# Patient Record
Sex: Female | Born: 1940 | Race: Black or African American | Hispanic: No | Marital: Married | Smoking: Former smoker
Health system: Southern US, Community
[De-identification: ages and names within clinical notes are randomized; demographics above are authoritative.]

## PROBLEM LIST (undated history)

## (undated) DIAGNOSIS — Z9221 Personal history of antineoplastic chemotherapy: Secondary | ICD-10-CM

## (undated) DIAGNOSIS — K449 Diaphragmatic hernia without obstruction or gangrene: Secondary | ICD-10-CM

## (undated) DIAGNOSIS — D702 Other drug-induced agranulocytosis: Principal | ICD-10-CM

## (undated) DIAGNOSIS — IMO0002 Reserved for concepts with insufficient information to code with codable children: Secondary | ICD-10-CM

## (undated) DIAGNOSIS — J45909 Unspecified asthma, uncomplicated: Secondary | ICD-10-CM

## (undated) DIAGNOSIS — E785 Hyperlipidemia, unspecified: Secondary | ICD-10-CM

## (undated) DIAGNOSIS — IMO0001 Reserved for inherently not codable concepts without codable children: Secondary | ICD-10-CM

## (undated) DIAGNOSIS — R1310 Dysphagia, unspecified: Secondary | ICD-10-CM

## (undated) DIAGNOSIS — M069 Rheumatoid arthritis, unspecified: Secondary | ICD-10-CM

## (undated) DIAGNOSIS — J449 Chronic obstructive pulmonary disease, unspecified: Secondary | ICD-10-CM

## (undated) DIAGNOSIS — C3490 Malignant neoplasm of unspecified part of unspecified bronchus or lung: Secondary | ICD-10-CM

## (undated) DIAGNOSIS — Z66 Do not resuscitate: Secondary | ICD-10-CM

## (undated) DIAGNOSIS — C3411 Malignant neoplasm of upper lobe, right bronchus or lung: Secondary | ICD-10-CM

## (undated) DIAGNOSIS — Z5112 Encounter for antineoplastic immunotherapy: Secondary | ICD-10-CM

## (undated) DIAGNOSIS — Z923 Personal history of irradiation: Secondary | ICD-10-CM

## (undated) HISTORY — DX: Dysphagia, unspecified: R13.10

## (undated) HISTORY — DX: Malignant neoplasm of upper lobe, right bronchus or lung: C34.11

## (undated) HISTORY — DX: Chronic obstructive pulmonary disease, unspecified: J44.9

## (undated) HISTORY — DX: Other drug-induced agranulocytosis: D70.2

## (undated) HISTORY — PX: OTHER SURGICAL HISTORY: SHX169

## (undated) HISTORY — DX: Encounter for antineoplastic immunotherapy: Z51.12

---

## 1960-10-21 HISTORY — PX: OTHER SURGICAL HISTORY: SHX169

## 2001-05-29 ENCOUNTER — Encounter (INDEPENDENT_AMBULATORY_CARE_PROVIDER_SITE_OTHER): Payer: Self-pay | Admitting: *Deleted

## 2001-05-29 ENCOUNTER — Ambulatory Visit (HOSPITAL_BASED_OUTPATIENT_CLINIC_OR_DEPARTMENT_OTHER): Admission: RE | Admit: 2001-05-29 | Discharge: 2001-05-29 | Payer: Self-pay | Admitting: *Deleted

## 2009-12-28 ENCOUNTER — Encounter (HOSPITAL_COMMUNITY)
Admission: RE | Admit: 2009-12-28 | Discharge: 2010-03-28 | Payer: Self-pay | Source: Home / Self Care | Admitting: Internal Medicine

## 2010-11-13 ENCOUNTER — Ambulatory Visit (HOSPITAL_COMMUNITY)
Admission: RE | Admit: 2010-11-13 | Discharge: 2010-11-13 | Payer: Self-pay | Source: Home / Self Care | Attending: Internal Medicine | Admitting: Internal Medicine

## 2011-03-08 NOTE — Op Note (Signed)
[place]  Patient:    Destiny Harrison, Destiny Harrison                           MRN: [id number] Proc. Date: 05/29/01 Adm. Date:  [id number] Attending:  Kendell Bane                           Operative Report  PREOPERATIVE DIAGNOSIS:  Mass left wrist and left forearm.  POSTOPERATIVE DIAGNOSIS:  Mass left wrist and left forearm.  PROCEDURE:  Excision of mass left wrist and left forearm.  SURGEON:  Lowell Bouton, M.D.  ANESTHESIA:  General.  OPERATIVE FINDINGS:  The patient had a diffuse swelling over the volar radial aspect of the left wrist and just deep to the radial artery was a synovial type of mass that extended into the radioscaphoid joint.  The second mass was located on the subcutaneous border of the ulna just distal to the olecranon. It was a fibrous type of mass without any fluid in it.  It was about 3 cm in diameter.  PROCEDURE:  Under general anesthesia with the tourniquet on the left arm, the left arm was prepped and draped in the usual fashion and after exsanguinating the limb the tourniquet was inflated to 250 mmHg.  A v-shaped incision was made over the volar radial aspect of the wrist, and blunt dissection carried through the subcutaneous tissues.  Bleeding points were coagulated.  Blunt dissection was carried down to the radial artery and a vessel loop was placed around the artery proximally.  The artery was then dissected out distally taking care to preserve its branches.  The mass was identified just deep to the radial artery and was dissected out from proximal to distal along the FCR sheath.  Sharp dissection was done from proximal to distal and the radiocarpal joint was entered.  After completing excising the mass there was some synovial type, fatty type tissue in the joint which was removed with a rongeur.  The joint was examined and there was some mild degenerative changes but no gross arthritic changes.  This  wound was then packed open and a longitudinal incision was made in the proximal forearm over the subcutaneous border of the ulna.  Sharp dissection was carried through the subcutaneous tissues and bleeding points were coagulated.  Blunt dissection was carried down to the mass which was bluntly dissected out circumferentially.  The mass was then sharply excised from the underlying periosteum.  It did not have any cystic fluid in it.  Both lesions were sent to the lab for evaluation including a portion of the wrist mass for crystal evaluation.  Bleeding was controlled with electrocautery over the forearm.  The wound was irrigated copiously with saline and closed over a vessel loop drain using 4-0 Vicryl in the subcutaneous tissue and 3-0 subcuticular Prolene in the skin.  Sterile dressings were applied.  The tourniquet was then released prior to closure of the wrist wound.  There was no gross bleeding from the radial artery.  The wound was irrigated copiously with saline.  The subcutaneous tissue was closed with 4-0 Vicryl and the skin with a 3-0 subcuticular Prolene.  A vessel loop drain was left in for drainage.  Steri-Strips were applied.  Sterile dressings were applied followed by a volar wrist splint. The patient tolerated the procedure well and went to the recovery room awake and stable and  in good condition. DD:  05/29/01 TD:  05/30/01 Job: [id number]

## 2011-09-30 ENCOUNTER — Other Ambulatory Visit: Payer: Self-pay | Admitting: Internal Medicine

## 2011-09-30 DIAGNOSIS — R918 Other nonspecific abnormal finding of lung field: Secondary | ICD-10-CM

## 2011-10-01 ENCOUNTER — Ambulatory Visit
Admission: RE | Admit: 2011-10-01 | Discharge: 2011-10-01 | Disposition: A | Discharge status: Home / Self Care | Payer: Medicare Other | Source: Ambulatory Visit | Attending: Internal Medicine | Admitting: Internal Medicine

## 2011-10-01 DIAGNOSIS — R918 Other nonspecific abnormal finding of lung field: Secondary | ICD-10-CM

## 2011-10-01 MED ORDER — IOHEXOL 300 MG/ML  SOLN
75.0000 mL | Freq: Once | INTRAMUSCULAR | Status: AC | PRN
  Administered 2011-10-01: 75 mL via INTRAVENOUS

## 2011-10-02 ENCOUNTER — Other Ambulatory Visit: Payer: Self-pay

## 2011-10-14 ENCOUNTER — Telehealth: Payer: Self-pay | Admitting: *Deleted

## 2011-10-14 NOTE — Telephone Encounter (Signed)
LMTCBx1 with pt husband to have her r/s appt on 10-18-11. Please see TD, Victorino Dike, or Lawson Fiscal to get the pt r/s. Thanks.Carron Curie, CMA

## 2011-10-16 NOTE — Telephone Encounter (Signed)
Patient is rescheduled for new consult with MR on 10/30/11 @ 3:15om.

## 2011-10-18 ENCOUNTER — Institutional Professional Consult (permissible substitution): Payer: Medicare Other | Admitting: Internal Medicine

## 2011-10-29 ENCOUNTER — Encounter: Payer: Self-pay | Admitting: Internal Medicine

## 2011-10-30 ENCOUNTER — Ambulatory Visit (INDEPENDENT_AMBULATORY_CARE_PROVIDER_SITE_OTHER): Payer: Medicare Other | Admitting: Internal Medicine

## 2011-10-30 ENCOUNTER — Encounter: Payer: Self-pay | Admitting: Internal Medicine

## 2011-10-30 VITALS — BP 120/68 | HR 99 | Temp 98.7°F | Ht 64.0 in | Wt 116.4 lb

## 2011-10-30 DIAGNOSIS — R0600 Dyspnea, unspecified: Secondary | ICD-10-CM

## 2011-10-30 DIAGNOSIS — R0989 Other specified symptoms and signs involving the circulatory and respiratory systems: Secondary | ICD-10-CM

## 2011-10-30 DIAGNOSIS — R222 Localized swelling, mass and lump, trunk: Secondary | ICD-10-CM

## 2011-10-30 DIAGNOSIS — R0609 Other forms of dyspnea: Secondary | ICD-10-CM

## 2011-10-30 NOTE — Progress Notes (Signed)
Subjective:    Patient ID: Destiny Harrison, female    DOB: 25-Nov-1940, 71 y.o.   MRN: [id number]  HPI IOV 10/30/2011 71 year old  reports that she quit smoking about 2 years ago but had 20 pack smokng hx.  Her smoking use included Cigarettes. She has a 20 pack-year smoking history. She has never used smokeless tobacco. states that in October 2012 noticed weight loss 9# in 3 months with current Body mass index is 19.98 kg/(m^2). With associated anemia. Then had CXR 09/26/11 shows LUL opacity which resulted in CT chest 10/01/11 which shows changes (below) suggestive of lung cancer. States son who is in waiting room know of possibility but she does not want him involved at this point   Known to have RA - from age 9. Dr Dierdre Forth. Prednisone and Methotrexate  Known to have asthma - uses only albuterol. Does not take anything else. Rates asthma as mild-moderate. Childhood asthma that resolved but last few years wheezing, dyspnea. USes albuterol twice a week atleast.  Has exertional dyspnea: half a mile. Improved by rest. Has chronic cough - with periodic flares.   Ct Chest W Contrast  10/01/2011  *RADIOLOGY REPORT*  Clinical Data: Unexplained weight loss, dry cough, also unexplained anemia, former smoker  CT CHEST WITH CONTRAST  Technique:  Multidetector CT imaging of the chest was performed following the standard protocol during bolus administration of intravenous contrast.  Contrast: 75mL OMNIPAQUE IOHEXOL 300 MG/ML IV SOLN  Comparison: Chest x-ray of 09/26/2011 and chest x-ray of 10/06/2007    Findings: On the lung window images there are diffuse changes of centrilobular emphysema present.  However, within the left upper lobe medially there is a spiculated mass present of approximately 3.3 x 2.8 x 6.1 cm consistent with primary lung carcinoma. Prominent interstitial markings and nodularity within the left upper lobe suspicious for metastatic involvement and lymphangitic spread of carcinoma.  The left upper  lobe bronchus is completely occluded presumably by tumor invasion. A small nodular opacity is present just above hemidiaphragm at the left lung base.  This most likely represents a pleural plaque, but a metastatic lesion cannot be excluded.  On soft tissue window images, the thyroid gland is unremarkable. The left upper lobe mass abuts the mediastinal contours with some loss of fat planes suggesting possible mediastinal invasion.  The superior mediastinal lymph node has short-axis diameter of 9 mm. No other mediastinal or hilar adenopathy is seen.  The pulmonary arteries and thoracic aorta opacify with no acute abnormality noted.    IMPRESSION:  1.  Spiculated mass within the medial left upper lobe abutting the mediastinum consistent with primary lung carcinoma. 2.  Prominent interstitial markings and nodularity within the left upper lobe may represent metastatic involvement and or lymphangitic spread of carcinoma. 3.  Slightly prominent superior mediastinal lymph node.  No other evidence of adenopathy. 4.  Diffuse changes of centrilobular emphysema.  Original Report Authenticated By: Juline Patch, M.D.    Past Medical History  Diagnosis Date  . COPD (chronic obstructive pulmonary disease)      Family History  Problem Relation Age of Onset  . Diabetes Mother     insulin dependent  . Breast cancer Mother      History   Social History  . Marital Status: Married    Spouse Name: N/A    Number of Children: 4  . Years of Education: N/A   Occupational History  . retired      Technical brewer  .  Social History Main Topics  . Smoking status: Former Smoker -- 0.5 packs/day for 40 years    Types: Cigarettes    Quit date: 10/29/2009  . Smokeless tobacco: Never Used  . Alcohol Use: No  . Drug Use: No  . Sexually Active: Not on file   Other Topics Concern  . Not on file   Social History Narrative   Spiritual person. Member of [employer] at [employer] where she lives  since age 86. Never missed a sermon. Sings in choir. CHildren go to same church but husband does not. Stated 10/30/11 she is not afraid of lung cancer diagnosis or prognosis because it is in "god's hand" and "god's will".      No Known Allergies   Outpatient Prescriptions Prior to Visit  Medication Sig Dispense Refill  . albuterol (PROVENTIL HFA;VENTOLIN HFA) 108 (90 BASE) MCG/ACT inhaler Inhale 2 puffs into the lungs every 6 (six) hours as needed.        . calcium carbonate 200 MG capsule Take 250 mg by mouth daily.        . Cholecalciferol (VITAMIN D PO) Take by mouth. 5000 units qd        . folic acid (FOLVITE) 1 MG tablet Take 1 mg by mouth daily.        Marland Kitchen HYDROcodone-acetaminophen (VICODIN) 5-500 MG per tablet Take 1 tablet by mouth every 6 (six) hours as needed.        . methotrexate 25 MG/ML SOLN 25 mg once. 1ml once weekly       . predniSONE (DELTASONE) 5 MG tablet Take 5 mg by mouth daily.        . Simvastatin (ZOCOR PO) Take 5 mg by mouth once.        Marland Kitchen levofloxacin (LEVAQUIN) 500 MG tablet Take 500 mg by mouth daily.               Review of Systems  Constitutional: Positive for unexpected weight change. Negative for fever.  HENT: Positive for rhinorrhea. Negative for ear pain, nosebleeds, congestion, sore throat, sneezing, trouble swallowing, dental problem, postnasal drip and sinus pressure.   Eyes: Negative for redness and itching.  Respiratory: Positive for cough, chest tightness, shortness of breath and wheezing.   Cardiovascular: Positive for leg swelling. Negative for palpitations.  Gastrointestinal: Positive for vomiting. Negative for nausea.  Genitourinary: Negative for dysuria.  Musculoskeletal: Positive for joint swelling.  Skin: Negative for rash.  Neurological: Negative for headaches.  Hematological: Does not bruise/bleed easily.  Psychiatric/Behavioral: Negative for dysphoric mood. The patient is not nervous/anxious.        Objective:   Physical Exam    Vitals reviewed. Constitutional: She is oriented to person, place, and time. She appears well-developed and well-nourished. No distress.       Body mass index is 19.98 kg/(m^2). Cachexia  HENT:  Head: Normocephalic and atraumatic.  Right Ear: External ear normal.  Left Ear: External ear normal.  Mouth/Throat: Oropharynx is clear and moist. No oropharyngeal exudate.  Eyes: Conjunctivae and EOM are normal. Pupils are equal, round, and reactive to light. Right eye exhibits no discharge. Left eye exhibits no discharge. No scleral icterus.  Neck: Normal range of motion. Neck supple. No JVD present. No tracheal deviation present. No thyromegaly present.  Cardiovascular: Normal rate, regular rhythm, normal heart sounds and intact distal pulses.  Exam reveals no gallop and no friction rub.   No murmur heard. Pulmonary/Chest: Effort normal and breath sounds normal. No  respiratory distress. She has no wheezes. She has no rales. She exhibits no tenderness.  Abdominal: Soft. Bowel sounds are normal. She exhibits no distension and no mass. There is no tenderness. There is no rebound and no guarding.  Musculoskeletal: Normal range of motion. She exhibits no edema and no tenderness.       RA+  Lymphadenopathy:    She has no cervical adenopathy.  Neurological: She is alert and oriented to person, place, and time. She has normal reflexes. No cranial nerve deficit. She exhibits normal muscle tone. Coordination normal.  Skin: Skin is warm and dry. No rash noted. She is not diaphoretic. No erythema. No pallor.  Psychiatric: She has a normal mood and affect. Her behavior is normal. Judgment and thought content normal.       Spiritual +          Assessment & Plan:

## 2011-10-30 NOTE — Patient Instructions (Addendum)
#  chest mass   - please have PET scan in < 1 week   - that will help [place] decide next biopsy procedure #cough and shortness of breath  - pleaes have full PFT breathing test at [facility] or [facility] < 1 week #Followup   < 1 week afte PET scan - if delays let me or my nurses know If unable to see < 1 week, then I will call to discuss results and next step

## 2011-11-01 ENCOUNTER — Ambulatory Visit (INDEPENDENT_AMBULATORY_CARE_PROVIDER_SITE_OTHER): Payer: Medicare Other | Admitting: Internal Medicine

## 2011-11-01 DIAGNOSIS — R0602 Shortness of breath: Secondary | ICD-10-CM

## 2011-11-01 LAB — PULMONARY FUNCTION TEST

## 2011-11-01 NOTE — Progress Notes (Signed)
PFT done today. 

## 2011-11-04 DIAGNOSIS — R0600 Dyspnea, unspecified: Secondary | ICD-10-CM | POA: Insufficient documentation

## 2011-11-04 DIAGNOSIS — R222 Localized swelling, mass and lump, trunk: Secondary | ICD-10-CM | POA: Insufficient documentation

## 2011-11-04 NOTE — Assessment & Plan Note (Signed)
#  cough and shortness of breath  - pleaes have full PFT breathing test at [facility] or [facility] < 1 week

## 2011-11-04 NOTE — Assessment & Plan Note (Signed)
#  chest mass - I discussed with her ddx and expressed and shared her concern that is high pre-test risk for malignancy  PLAN   - please have PET scan in < 1 week   - that will help [place] decide next biopsy procedure  #Followup   < 1 week afte PET scan - if delays let me or my nurses know If unable to see < 1 week, then I will call to discuss results and next step

## 2011-11-07 ENCOUNTER — Encounter (HOSPITAL_COMMUNITY): Payer: Self-pay

## 2011-11-07 ENCOUNTER — Encounter (HOSPITAL_COMMUNITY)
Admission: RE | Admit: 2011-11-07 | Discharge: 2011-11-07 | Disposition: A | Discharge status: Home / Self Care | Payer: Medicare Other | Source: Ambulatory Visit | Attending: Internal Medicine | Admitting: Internal Medicine

## 2011-11-07 ENCOUNTER — Telehealth: Payer: Self-pay | Admitting: Internal Medicine

## 2011-11-07 DIAGNOSIS — R599 Enlarged lymph nodes, unspecified: Secondary | ICD-10-CM | POA: Insufficient documentation

## 2011-11-07 DIAGNOSIS — J984 Other disorders of lung: Secondary | ICD-10-CM | POA: Insufficient documentation

## 2011-11-07 DIAGNOSIS — R222 Localized swelling, mass and lump, trunk: Secondary | ICD-10-CM

## 2011-11-07 LAB — GLUCOSE, CAPILLARY: Glucose-Capillary: 95 mg/dL (ref 70–99)

## 2011-11-07 MED ORDER — FLUDEOXYGLUCOSE F - 18 (FDG) INJECTION: 18.9000 | Freq: Once | INTRAVENOUS | Status: AC | PRN

## 2011-11-07 NOTE — Telephone Encounter (Signed)
Called patient to give PET scan report: PEt scan improved but one area of hot uptake prsent. Might need EBUS v regular bronch v watch. Advised her to come in to discuss. Ensure full PFTs prior to visit. Needs to come in < 2 weeks; if not let me know

## 2011-11-08 ENCOUNTER — Encounter: Payer: Self-pay | Admitting: Internal Medicine

## 2011-11-12 NOTE — Telephone Encounter (Signed)
LMTCBx1 to schedule appt. I have held slot on 11-18-11 for her. If she can do this appt then we will need to order pft to be done at hospital prior to appt. Will await call-back. Carron Curie, CMA

## 2011-11-13 NOTE — Telephone Encounter (Signed)
LMTCBx1.Jennifer Castillo, CMA  

## 2011-11-13 NOTE — Telephone Encounter (Addendum)
Pt is returning MR's call & can be reached at [phone].   Attempted to schedule PFT & appt., however, pt stated she has all ready done the breathing test.   Antionette Fairy

## 2011-11-13 NOTE — Telephone Encounter (Signed)
Pt advised that she does not need another PFT. Appt set for 11-18-11 at 2pm. Carron Curie, CMA

## 2011-11-13 NOTE — Telephone Encounter (Addendum)
Pt returned call. Pt had pft's recently (11/01/11)- does she need this again? I did not put pt on MR's schedule for f/u yet as she requests to speak to nurse first.   Hazel Sams

## 2011-11-13 NOTE — Telephone Encounter (Signed)
Pt called back-confused.  I explained that MR has told us that he would like her to complete a breathing test prior to her next appointment.  Pt again stated that she has all ready completed one.  I explained that we have made that notation & that we are waiting to hear back from MR as to whether or not she needs another PFT since her 11/01/11 PFT.  Pt requested [place] to give her a call back asap so we can get whatever appts needed scheduled.  Thanks.  Antionette Fairy

## 2011-11-18 ENCOUNTER — Ambulatory Visit (INDEPENDENT_AMBULATORY_CARE_PROVIDER_SITE_OTHER): Payer: Medicare Other | Admitting: Internal Medicine

## 2011-11-18 ENCOUNTER — Encounter: Payer: Self-pay | Admitting: Internal Medicine

## 2011-11-18 VITALS — BP 120/62 | HR 86 | Temp 97.8°F | Ht 64.0 in | Wt 119.0 lb

## 2011-11-18 DIAGNOSIS — R599 Enlarged lymph nodes, unspecified: Secondary | ICD-10-CM

## 2011-11-18 DIAGNOSIS — Z23 Encounter for immunization: Secondary | ICD-10-CM

## 2011-11-18 DIAGNOSIS — C3490 Malignant neoplasm of unspecified part of unspecified bronchus or lung: Secondary | ICD-10-CM

## 2011-11-18 DIAGNOSIS — J449 Chronic obstructive pulmonary disease, unspecified: Secondary | ICD-10-CM | POA: Diagnosis not present

## 2011-11-18 DIAGNOSIS — R222 Localized swelling, mass and lump, trunk: Secondary | ICD-10-CM

## 2011-11-18 MED ORDER — TIOTROPIUM BROMIDE MONOHYDRATE 18 MCG IN CAPS: 18.0000 ug | ORAL_CAPSULE | Freq: Every day | RESPIRATORY_TRACT | Status: DC

## 2011-11-18 NOTE — Assessment & Plan Note (Signed)
Improved on PET scan within 1 month. Only hot lesion is a left hilar node. Best to repeat ct chest with contrast in 2-3 months and reassess. She is agreeable with plan

## 2011-11-18 NOTE — Patient Instructions (Signed)
#  COPD  - you have copd that is moderate  - nurse will chagne pro-air to generic ventolin - Please start spiriva 1 puff daily - take sample, script and show technique - glad you had flu shot  - take pneumovax vaccine today #Lung Mass  - this is much improved but not fully gone  - please have CT chest with contrast in 2 months for mediastinal node #Followup  - 2 months iwht cT chest  - return sooner if there are problems

## 2011-11-18 NOTE — Assessment & Plan Note (Signed)
PFts done while on chronic prednisone for RA shows gold stage 2 copd. Will start spiriva. She has had flu shot. Will give pneumovax. Next visit check alpha 1. AT somepoint, will talk rehab

## 2011-11-18 NOTE — Progress Notes (Signed)
Subjective:    Patient ID: Destiny Harrison, female    DOB: May 27, 1941, 71 y.o.   MRN: [id number]  HPI  IOV 11/18/2011 71 year old  reports that she quit smoking about 2 years ago but had 20 pack smokng hx.  Her smoking use included Cigarettes. She has a 20 pack-year smoking history. She has never used smokeless tobacco. states that in October 2012 noticed weight loss 9# in 3 months with current There is no height or weight on file to calculate BMI. With associated anemia. Then had CXR 09/26/11 shows LUL opacity which resulted in CT chest 10/01/11 which shows changes (below) suggestive of lung cancer. States son who is in waiting room know of possibility but she does not want him involved at this point   Known to have RA - from age 58. Dr Dierdre Forth. Prednisone and Methotrexate  Known to have asthma - uses only albuterol. Does not take anything else. Rates asthma as mild-moderate. Childhood asthma that resolved but last few years wheezing, dyspnea. USes albuterol twice a week atleast.  Has exertional dyspnea: half a mile. Improved by rest. Has chronic cough - with periodic flares.   Ct Chest W Contrast  10/01/2011  *RADIOLOGY REPORT*  Clinical Data: Unexplained weight loss, dry cough, also unexplained anemia, former smoker  CT CHEST WITH CONTRAST  Technique:  Multidetector CT imaging of the chest was performed following the standard protocol during bolus administration of intravenous contrast.  Contrast: 75mL OMNIPAQUE IOHEXOL 300 MG/ML IV SOLN  Comparison: Chest x-ray of 09/26/2011 and chest x-ray of 10/06/2007    Findings: On the lung window images there are diffuse changes of centrilobular emphysema present.  However, within the left upper lobe medially there is a spiculated mass present of approximately 3.3 x 2.8 x 6.1 cm consistent with primary lung carcinoma. Prominent interstitial markings and nodularity within the left upper lobe suspicious for metastatic involvement and lymphangitic spread of  carcinoma.  The left upper lobe bronchus is completely occluded presumably by tumor invasion. A small nodular opacity is present just above hemidiaphragm at the left lung base.  This most likely represents a pleural plaque, but a metastatic lesion cannot be excluded.  On soft tissue window images, the thyroid gland is unremarkable. The left upper lobe mass abuts the mediastinal contours with some loss of fat planes suggesting possible mediastinal invasion.  The superior mediastinal lymph node has short-axis diameter of 9 mm. No other mediastinal or hilar adenopathy is seen.  The pulmonary arteries and thoracic aorta opacify with no acute abnormality noted.    IMPRESSION:  1.  Spiculated mass within the medial left upper lobe abutting the mediastinum consistent with primary lung carcinoma. 2.  Prominent interstitial markings and nodularity within the left upper lobe may represent metastatic involvement and or lymphangitic spread of carcinoma. 3.  Slightly prominent superior mediastinal lymph node.  No other evidence of adenopathy. 4.  Diffuse changes of centrilobular emphysema.  Original Report Authenticated By: Juline Patch, M.D.   #chest mass  - please have PET scan in < 1 week  - that will help [place] decide next biopsy procedure  #cough and shortness of breath  - please have full PFT breathing test at [facility] < 1 [facility]  #Followup  < 1 week afte PET scan - if delays let me or my nurses know  If unable to see < 1 week, then I will call to discuss results and next step    OV 11/18/2011  No  new complaints. Feels well; just with baseline issues of last vsiti.   Thinks she is losing weight but indeed she is gaining weight.   PET scan Nm Pet Image Initial (pi) Skull Base To Thigh: shows significant improvement of LUL mass. Only residual is a small left hilar node that is still PET HOT (this is done 1 month from date of CT chest)  11/07/2011  *RADIOLOGY REPORT*  Clinical Data:  Initial treatment  strategy for chest mass.  NUCLEAR MEDICINE PET CT INITIAL (PI) SKULL BASE TO THIGH  Technique:  18.9 mCi F-18 FDG was injected intravenously via the right antecubital fossa.  Full-ring PET imaging was performed from the skull base through the mid-thighs 61  minutes after injection. CT data was obtained and used for attenuation correction and anatomic localization only.  (This was not acquired as a diagnostic CT examination.)  Fasting Blood Glucose:  95  Patient Weight:  116 pounds.  Comparison:  CT chest 10/01/2011.  Findings: There has been marked interval improvement in left upper lobe air space consolidation.  Residual nodularity and volume loss in the left upper lobe shows mild hypermetabolism.  Focal hypermetabolism in the left hilum has an S U V max of 10.7.  There is corresponding soft tissue density within the left upper lobe bronchus (CT image 79). This finding is also seen on 10/01/2011. No additional areas of abnormal hypermetabolism in the neck, chest, abdomen or pelvis.  CT images were performed for attenuation correction.  No acute findings in the neck.  No pericardial or pleural effusion.  No acute findings in the abdomen or pelvis.  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  IMPRESSION:  1.  Interval improvement in left upper lobe air space consolidation with residual peribronchovascular nodularity and volume loss as well as mild corresponding hypermetabolism. 2.  Intense hypermetabolism in the left hilum corresponds to soft tissue or debris in the left upper lobe bronchus, also seen on 10/01/2011.  Continued follow-up to clearing or bronchoscopy recommended, as a centrally obstructing lesion is considered.  Original Report Authenticated By: Reyes Ivan, M.D.    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 FEv1 1L/54%., 14% BD response on FVC, ratio 54, DLCO 42%   Past, Family, Social reviewed: no change since last visit   Review of Systems  Constitutional:  Negative for fever and unexpected weight change.  HENT: Negative for ear pain, nosebleeds, congestion, sore throat, rhinorrhea, sneezing, trouble swallowing, dental problem, postnasal drip and sinus pressure.   Eyes: Negative for redness and itching.  Respiratory: Negative for cough, chest tightness, shortness of breath and wheezing.   Cardiovascular: Negative for palpitations and leg swelling.  Gastrointestinal: Positive for vomiting. Negative for nausea.  Genitourinary: Negative for dysuria.  Musculoskeletal: Negative for joint swelling.  Skin: Negative for rash.  Neurological: Negative for headaches.  Hematological: Does not bruise/bleed easily.  Psychiatric/Behavioral: Negative for dysphoric mood. The patient is not nervous/anxious.        Objective:   Physical Exam  Vitals reviewed. Constitutional: She is oriented to person, place, and time. She appears well-developed and well-nourished. No distress.       Body mass index is 19.98 kg/(m^2). - Last ov Cachexia Body mass index is 20.43 kg/(m^2). 11/18/2011    HENT:  Head: Normocephalic and atraumatic.  Right Ear: External ear normal.  Left Ear: External ear normal.  Mouth/Throat: Oropharynx is clear and moist. No oropharyngeal exudate.  Eyes: Conjunctivae and EOM are  normal. Pupils are equal, round, and reactive to light. Right eye exhibits no discharge. Left eye exhibits no discharge. No scleral icterus.  Neck: Normal range of motion. Neck supple. No JVD present. No tracheal deviation present. No thyromegaly present.  Cardiovascular: Normal rate, regular rhythm, normal heart sounds and intact distal pulses.  Exam reveals no gallop and no friction rub.   No murmur heard. Pulmonary/Chest: Effort normal and breath sounds normal. No respiratory distress. She has no wheezes. She has no rales. She exhibits no tenderness.  Abdominal: Soft. Bowel sounds are normal. She exhibits no distension and no mass. There is no tenderness. There is  no rebound and no guarding.  Musculoskeletal: Normal range of motion. She exhibits no edema and no tenderness.       RA+  Lymphadenopathy:    She has no cervical adenopathy.  Neurological: She is alert and oriented to person, place, and time. She has normal reflexes. No cranial nerve deficit. She exhibits normal muscle tone. Coordination normal.  Skin: Skin is warm and dry. No rash noted. She is not diaphoretic. No erythema. No pallor.  Psychiatric: She has a normal mood and affect. Her behavior is normal. Judgment and thought content normal.       Spiritual +          Assessment & Plan:

## 2011-11-21 DIAGNOSIS — M069 Rheumatoid arthritis, unspecified: Secondary | ICD-10-CM | POA: Diagnosis not present

## 2011-11-21 DIAGNOSIS — M47817 Spondylosis without myelopathy or radiculopathy, lumbosacral region: Secondary | ICD-10-CM | POA: Diagnosis not present

## 2011-11-21 DIAGNOSIS — M899 Disorder of bone, unspecified: Secondary | ICD-10-CM | POA: Diagnosis not present

## 2011-11-21 DIAGNOSIS — M159 Polyosteoarthritis, unspecified: Secondary | ICD-10-CM | POA: Diagnosis not present

## 2011-11-21 DIAGNOSIS — M79609 Pain in unspecified limb: Secondary | ICD-10-CM | POA: Diagnosis not present

## 2011-11-21 DIAGNOSIS — IMO0002 Reserved for concepts with insufficient information to code with codable children: Secondary | ICD-10-CM | POA: Diagnosis not present

## 2011-11-21 DIAGNOSIS — M889 Osteitis deformans of unspecified bone: Secondary | ICD-10-CM | POA: Diagnosis not present

## 2011-12-17 DIAGNOSIS — M159 Polyosteoarthritis, unspecified: Secondary | ICD-10-CM | POA: Diagnosis not present

## 2011-12-17 DIAGNOSIS — M069 Rheumatoid arthritis, unspecified: Secondary | ICD-10-CM | POA: Diagnosis not present

## 2011-12-17 DIAGNOSIS — M889 Osteitis deformans of unspecified bone: Secondary | ICD-10-CM | POA: Diagnosis not present

## 2011-12-17 DIAGNOSIS — M899 Disorder of bone, unspecified: Secondary | ICD-10-CM | POA: Diagnosis not present

## 2012-01-14 ENCOUNTER — Ambulatory Visit (INDEPENDENT_AMBULATORY_CARE_PROVIDER_SITE_OTHER)
Admission: RE | Admit: 2012-01-14 | Discharge: 2012-01-14 | Disposition: A | Discharge status: Home / Self Care | Payer: Medicare Other | Source: Ambulatory Visit | Attending: Internal Medicine | Admitting: Internal Medicine

## 2012-01-14 ENCOUNTER — Telehealth: Payer: Self-pay | Admitting: Internal Medicine

## 2012-01-14 ENCOUNTER — Encounter: Payer: Self-pay | Admitting: Internal Medicine

## 2012-01-14 ENCOUNTER — Ambulatory Visit (INDEPENDENT_AMBULATORY_CARE_PROVIDER_SITE_OTHER): Payer: Medicare Other | Admitting: Internal Medicine

## 2012-01-14 VITALS — BP 132/70 | HR 115 | Temp 98.5°F | Ht 64.5 in | Wt 135.2 lb

## 2012-01-14 DIAGNOSIS — J438 Other emphysema: Secondary | ICD-10-CM | POA: Diagnosis not present

## 2012-01-14 DIAGNOSIS — R222 Localized swelling, mass and lump, trunk: Secondary | ICD-10-CM

## 2012-01-14 DIAGNOSIS — R599 Enlarged lymph nodes, unspecified: Secondary | ICD-10-CM | POA: Diagnosis not present

## 2012-01-14 DIAGNOSIS — K449 Diaphragmatic hernia without obstruction or gangrene: Secondary | ICD-10-CM | POA: Diagnosis not present

## 2012-01-14 DIAGNOSIS — J189 Pneumonia, unspecified organism: Secondary | ICD-10-CM | POA: Diagnosis not present

## 2012-01-14 MED ORDER — IOHEXOL 300 MG/ML  SOLN
80.0000 mL | Freq: Once | INTRAMUSCULAR | Status: AC | PRN
  Administered 2012-01-14: 80 mL via INTRAVENOUS

## 2012-01-14 NOTE — Progress Notes (Signed)
Subjective:    Patient ID: Destiny Harrison, female    DOB: 1941-10-17, 71 y.o.   MRN: [id number]  HPI IOV 11/18/2011 71 year old  reports that she quit smoking about 2 years ago but had 20 pack smokng hx.  Her smoking use included Cigarettes. She has a 20 pack-year smoking history. She has never used smokeless tobacco. states that in October 2012 noticed weight loss 9# in 3 months with current There is no height or weight on file to calculate BMI. With associated anemia. Then had CXR 09/26/11 shows LUL opacity which resulted in CT chest 10/01/11 which shows changes (below) suggestive of lung cancer. States son who is in waiting room know of possibility but she does not want him involved at this point   Known to have RA - from age 37. Dr Dierdre Forth. Prednisone and Methotrexate  Known to have asthma - uses only albuterol. Does not take anything else. Rates asthma as mild-moderate. Childhood asthma that resolved but last few years wheezing, dyspnea. USes albuterol twice a week atleast.  Has exertional dyspnea: half a mile. Improved by rest. Has chronic cough - with periodic flares.   Ct Chest W Contrast  10/01/2011  *RADIOLOGY REPORT*  Clinical Data: Unexplained weight loss, dry cough, also unexplained anemia, former smoker  CT CHEST WITH CONTRAST  Technique:  Multidetector CT imaging of the chest was performed following the standard protocol during bolus administration of intravenous contrast.  Contrast: 75mL OMNIPAQUE IOHEXOL 300 MG/ML IV SOLN  Comparison: Chest x-ray of 09/26/2011 and chest x-ray of 10/06/2007    Findings: On the lung window images there are diffuse changes of centrilobular emphysema present.  However, within the left upper lobe medially there is a spiculated mass present of approximately 3.3 x 2.8 x 6.1 cm consistent with primary lung carcinoma. Prominent interstitial markings and nodularity within the left upper lobe suspicious for metastatic involvement and lymphangitic spread of  carcinoma.  The left upper lobe bronchus is completely occluded presumably by tumor invasion. A small nodular opacity is present just above hemidiaphragm at the left lung base.  This most likely represents a pleural plaque, but a metastatic lesion cannot be excluded.  On soft tissue window images, the thyroid gland is unremarkable. The left upper lobe mass abuts the mediastinal contours with some loss of fat planes suggesting possible mediastinal invasion.  The superior mediastinal lymph node has short-axis diameter of 9 mm. No other mediastinal or hilar adenopathy is seen.  The pulmonary arteries and thoracic aorta opacify with no acute abnormality noted.    IMPRESSION:  1.  Spiculated mass within the medial left upper lobe abutting the mediastinum consistent with primary lung carcinoma. 2.  Prominent interstitial markings and nodularity within the left upper lobe may represent metastatic involvement and or lymphangitic spread of carcinoma. 3.  Slightly prominent superior mediastinal lymph node.  No other evidence of adenopathy. 4.  Diffuse changes of centrilobular emphysema.  Original Report Authenticated By: Juline Patch, M.D.   #chest mass  - please have PET scan in < 1 week  - that will help [place] decide next biopsy procedure  #cough and shortness of breath  - please have full PFT breathing test at [facility] or [facility] < 1 week  #Followup  < 1 week afte PET scan - if delays let me or my nurses know  If unable to see < 1 week, then I will call to discuss results and next step    OV 11/18/2011  No new  complaints. Feels well; just with baseline issues of last vsiti.   Thinks she is losing weight but indeed she is gaining weight.   PET scan Nm Pet Image Initial (pi) Skull Base To Thigh: shows significant improvement of LUL mass. Only residual is a small left hilar node that is still PET HOT (this is done 1 month from date of CT chest)  11/07/2011  *RADIOLOGY REPORT*  Clinical Data:  Initial treatment  strategy for chest mass.  NUCLEAR MEDICINE PET CT INITIAL (PI) SKULL BASE TO THIGH  Technique:  18.9 mCi F-18 FDG was injected intravenously via the right antecubital fossa.  Full-ring PET imaging was performed from the skull base through the mid-thighs 61  minutes after injection. CT data was obtained and used for attenuation correction and anatomic localization only.  (This was not acquired as a diagnostic CT examination.)  Fasting Blood Glucose:  95  Patient Weight:  116 pounds.  Comparison:  CT chest 10/01/2011.  Findings: There has been marked interval improvement in left upper lobe air space consolidation.  Residual nodularity and volume loss in the left upper lobe shows mild hypermetabolism.  Focal hypermetabolism in the left hilum has an S U V max of 10.7.  There is corresponding soft tissue density within the left upper lobe bronchus (CT image 79). This finding is also seen on 10/01/2011. No additional areas of abnormal hypermetabolism in the neck, chest, abdomen or pelvis.  CT images were performed for attenuation correction.  No acute findings in the neck.  No pericardial or pleural effusion.  No acute findings in the abdomen or pelvis.  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  IMPRESSION:  1.  Interval improvement in left upper lobe air space consolidation with residual peribronchovascular nodularity and volume loss as well as mild corresponding hypermetabolism. 2.  Intense hypermetabolism in the left hilum corresponds to soft tissue or debris in the left upper lobe bronchus, also seen on 10/01/2011.  Continued follow-up to clearing or bronchoscopy recommended, as a centrally obstructing lesion is considered.  Original Report Authenticated By: Reyes Ivan, M.D.    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 FEv1 1L/54%., 14% BD response on FVC, ratio 54, DLCO 42%   Past, Family, Social reviewed: no change since last   #COPD  - you have copd that is moderate    - nurse will chagne pro-air to generic ventolin  - Please start spiriva 1 puff daily - take sample, script and show technique  - glad you had flu shot  - take pneumovax vaccine today  #Lung Mass  - this is much improved but not fully gone  - please have CT chest with contrast in 2 months for mediastinal node  #Followup  - 2 months iwht cT chest  - return sooner if there are problems  OV 01/14/2012 Body mass index is 22.85 kg/(m^2).  Gained weight Stable class 2 dyspnea  No new health problems Here to discuss results of CT - the LLUL infiltrate shows continued improvement but there is suspicion of endobronchial lesion. Bronch needed  Ct Chest W Contrast  01/14/2012  *RADIOLOGY REPORT*  Clinical Data: Follow up for left upper lobe endobronchial lesion noted on prior CT scans.  CT CHEST WITH CONTRAST  Technique:  Multidetector CT imaging of the chest was performed following the standard protocol during bolus administration of intravenous contrast.  Contrast:  80 ml of Omnipaque-300.  Comparison: PET CT dated 11/07/2011.  Findings:  Mediastinum: Heart size is normal. There is no significant pericardial fluid, thickening or pericardial calcification. Prominent nodal tissue is again noted in the left hilar region measuring up to 8 mm in short axis (similar to prior).  No pathologically enlarged right hilar or mediastinal lymph nodes are noted. There is atherosclerosis of the thoracic aorta, the great vessels of the mediastinum and the coronary arteries, including calcified atherosclerotic plaque in the left main, left circumflex and right coronary arteries. There is a small hiatal hernia.  Lungs/Pleura:  Again noted is soft tissue filling the left upper lobe bronchi extending to the apicoposterior segment.  This soft tissue is similar to the original examination from 10/01/2011, and corresponds with an area of hypermetabolic activity noted on recent PET CT dated 11/07/2011.  There continues to be some  mild postobstructive changes in the apicoposterior segment of the left upper lobe, compatible with postobstructive pneumonitis.  There is a background of moderate centrilobular emphysema and mild diffuse bronchial wall thickening.  No pleural effusions.  Upper Abdomen: Unremarkable.  Musculoskeletal: There are no aggressive appearing lytic or blastic lesions noted in the visualized portions of the skeleton.  IMPRESSION: 1.  Persistent soft tissue filling the bronchi to the apicoposterior segment of the left upper lobe, with persistent postobstructive pneumonitis in the same distribution, as detailed above.  Given the hypermetabolic activity on the recent PET CT, this finding is highly concerning for an endobronchial obstructing neoplasm, and further evaluation with bronchoscopy and bronchoscopic guided biopsy is recommended at this time. 2.  Borderline enlarged left hilar lymph node measuring 8 mm in short axis may be reactive, or could suggest the metastatic nodal involvement as well. 3.  No new mediastinal or contralateral hilar nodal enlargement is identified.  4. 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 Please note that although the presence of coronary artery calcium documents the presence of coronary artery disease, the severity of this disease and any potential stenosis cannot be assessed on this non-gated CT examination.  Assessment for potential risk factor modification, dietary therapy or pharmacologic therapy may be warranted, if clinically indicated. 5.  Small hiatal hernia. 6.  Background of moderate centrilobular emphysema and mild diffuse bronchial wall thickening; changes compatible with underlying COPD.  These results were called by telephone on 01/14/2012  at  2:23 p.m. to  Dr. Marchelle Gearing, who verbally acknowledged these results.  Original Report Authenticated By: Florencia Reasons, M.D.      Review of Systems  Constitutional: Negative for fever and  unexpected weight change.  HENT: Negative for ear pain, nosebleeds, congestion, sore throat, rhinorrhea, sneezing, trouble swallowing, dental problem, postnasal drip and sinus pressure.   Eyes: Negative for redness and itching.  Respiratory: Positive for shortness of breath. Negative for cough, chest tightness and wheezing.   Cardiovascular: Negative for palpitations and leg swelling.  Gastrointestinal: Negative for nausea and vomiting.  Genitourinary: Negative for dysuria.  Musculoskeletal: Negative for joint swelling.  Skin: Negative for rash.  Neurological: Negative for headaches.  Hematological: Does not bruise/bleed easily.  Psychiatric/Behavioral: Negative for dysphoric mood. The patient is not nervous/anxious.        Objective:   Physical Exam Vitals reviewed. Constitutional: She is oriented to person, place, and time. She appears well-developed and well-nourished. No distress.       Body mass index is 19.98 kg/(m^2). - Last ov Cachexia Body mass index is 20.43 kg/(m^2). 11/18/2011    HENT:  Head: Normocephalic and atraumatic.  Right Ear: External ear  normal.  Left Ear: External ear normal.  Mouth/Throat: Oropharynx is clear and moist. No oropharyngeal exudate.  Eyes: Conjunctivae and EOM are normal. Pupils are equal, round, and reactive to light. Right eye exhibits no discharge. Left eye exhibits no discharge. No scleral icterus.  Neck: Normal range of motion. Neck supple. No JVD present. No tracheal deviation present. No thyromegaly present.  Cardiovascular: Normal rate, regular rhythm, normal heart sounds and intact distal pulses.  Exam reveals no gallop and no friction rub.   No murmur heard. Pulmonary/Chest: Effort normal and breath sounds normal. No respiratory distress. She has no wheezes. She has no rales. She exhibits no tenderness.  Abdominal: Soft. Bowel sounds are normal. She exhibits no distension and no mass. There is no tenderness. There is no rebound and no  guarding.  Musculoskeletal: Normal range of motion. She exhibits no edema and no tenderness.       RA+  Lymphadenopathy:    She has no cervical adenopathy.  Neurological: She is alert and oriented to person, place, and time. She has normal reflexes. No cranial nerve deficit. She exhibits normal muscle tone. Coordination normal.  Skin: Skin is warm and dry. No rash noted. She is not diaphoretic. No erythema. No pallor.  Psychiatric: She has a normal mood and affect. Her behavior is normal. Judgment and thought content normal.       Spiritual +         Assessment & Plan:

## 2012-01-14 NOTE — Telephone Encounter (Signed)
done

## 2012-01-14 NOTE — Patient Instructions (Signed)
#  COPD  - you have copd that is moderate  -  generic ventolin as needed - Continue  spiriva 1 puff daily -  - Will give you sample advair or dulera or symbicort - take daily as instructed to see if this helps   #Lung Mass  -  but not fully gone  -  You need bronchoscopy - I will discuss best method for you and call you back to set it up  #Followup  - based on bronchoscopy

## 2012-01-15 ENCOUNTER — Ambulatory Visit: Payer: Medicare Other | Admitting: Internal Medicine

## 2012-01-15 ENCOUNTER — Other Ambulatory Visit: Payer: Medicare Other

## 2012-01-21 ENCOUNTER — Encounter: Payer: Self-pay | Admitting: Internal Medicine

## 2012-01-21 NOTE — Assessment & Plan Note (Signed)
There is suspicion of LUL nodule ddespite progressive improvement in LUL infiltrate. This is more apparent now. Will proceed with bronch and possibly endobronchial biopsy 01/28/12 at [facility] at 8am. Risks of bleeding, pneumothorax, non diagnosis, sedation complications including cardio reesp arrest and stroke all explained. She agrees to risks and will proceeed  > 50% of this > 15 min visit spent in face to face counseling

## 2012-01-28 ENCOUNTER — Encounter (HOSPITAL_COMMUNITY): Payer: Self-pay | Admitting: Radiology

## 2012-01-28 ENCOUNTER — Telehealth: Payer: Self-pay | Admitting: Internal Medicine

## 2012-01-28 ENCOUNTER — Ambulatory Visit (HOSPITAL_COMMUNITY)
Admission: RE | Admit: 2012-01-28 | Discharge: 2012-01-28 | Disposition: A | Discharge status: Home / Self Care | Payer: Medicare Other | Source: Ambulatory Visit | Attending: Internal Medicine | Admitting: Internal Medicine

## 2012-01-28 ENCOUNTER — Encounter (HOSPITAL_COMMUNITY)
Admission: RE | Disposition: A | Discharge status: Home / Self Care | Payer: Self-pay | Source: Ambulatory Visit | Attending: Internal Medicine

## 2012-01-28 DIAGNOSIS — J189 Pneumonia, unspecified organism: Secondary | ICD-10-CM | POA: Diagnosis not present

## 2012-01-28 DIAGNOSIS — R222 Localized swelling, mass and lump, trunk: Secondary | ICD-10-CM

## 2012-01-28 DIAGNOSIS — C3410 Malignant neoplasm of upper lobe, unspecified bronchus or lung: Secondary | ICD-10-CM | POA: Diagnosis not present

## 2012-01-28 HISTORY — PX: VIDEO BRONCHOSCOPY: SHX5072

## 2012-01-28 SURGERY — VIDEO BRONCHOSCOPY WITHOUT FLUORO
Anesthesia: Moderate Sedation | Laterality: Bilateral

## 2012-01-28 MED ORDER — PHENYLEPHRINE HCL 0.25 % NA SOLN
NASAL | Status: DC | PRN
  Administered 2012-01-28: 2 via NASAL

## 2012-01-28 MED ORDER — FENTANYL CITRATE 0.05 MG/ML IJ SOLN
INTRAMUSCULAR | Status: AC
  Filled 2012-01-28: qty 4

## 2012-01-28 MED ORDER — FENTANYL CITRATE 0.05 MG/ML IJ SOLN
INTRAMUSCULAR | Status: DC | PRN
  Administered 2012-01-28 (×2): 25 ug via INTRAVENOUS
  Administered 2012-01-28: 12.5 ug via INTRAVENOUS

## 2012-01-28 MED ORDER — LIDOCAINE VISCOUS 2 % MT SOLN
OROMUCOSAL | Status: DC | PRN
  Administered 2012-01-28: 2 mL via OROMUCOSAL

## 2012-01-28 MED ORDER — LIDOCAINE HCL (PF) 1 % IJ SOLN
INTRAMUSCULAR | Status: DC | PRN
  Administered 2012-01-28: 6 mL

## 2012-01-28 MED ORDER — MIDAZOLAM HCL 10 MG/2ML IJ SOLN
INTRAMUSCULAR | Status: AC
  Filled 2012-01-28: qty 4

## 2012-01-28 MED ORDER — MIDAZOLAM HCL 10 MG/2ML IJ SOLN
INTRAMUSCULAR | Status: DC | PRN
  Administered 2012-01-28 (×2): 2 mg via INTRAVENOUS

## 2012-01-28 NOTE — H&P (View-Only) (Signed)
Subjective:    Patient ID: Destiny Harrison, female    DOB: 11-Mar-1941, 71 y.o.   MRN: [id number]  HPI IOV 11/18/2011 72 year old  reports that she quit smoking about 2 years ago but had 20 pack smokng hx.  Her smoking use included Cigarettes. She has a 20 pack-year smoking history. She has never used smokeless tobacco. states that in October 2012 noticed weight loss 9# in 3 months with current There is no height or weight on file to calculate BMI. With associated anemia. Then had CXR 09/26/11 shows LUL opacity which resulted in CT chest 10/01/11 which shows changes (below) suggestive of lung cancer. States son who is in waiting room know of possibility but she does not want him involved at this point   Known to have RA - from age 64. Dr Dierdre Forth. Prednisone and Methotrexate  Known to have asthma - uses only albuterol. Does not take anything else. Rates asthma as mild-moderate. Childhood asthma that resolved but last few years wheezing, dyspnea. USes albuterol twice a week atleast.  Has exertional dyspnea: half a mile. Improved by rest. Has chronic cough - with periodic flares.   Ct Chest W Contrast  10/01/2011  *RADIOLOGY REPORT*  Clinical Data: Unexplained weight loss, dry cough, also unexplained anemia, former smoker  CT CHEST WITH CONTRAST  Technique:  Multidetector CT imaging of the chest was performed following the standard protocol during bolus administration of intravenous contrast.  Contrast: 75mL OMNIPAQUE IOHEXOL 300 MG/ML IV SOLN  Comparison: Chest x-ray of 09/26/2011 and chest x-ray of 10/06/2007    Findings: On the lung window images there are diffuse changes of centrilobular emphysema present.  However, within the left upper lobe medially there is a spiculated mass present of approximately 3.3 x 2.8 x 6.1 cm consistent with primary lung carcinoma. Prominent interstitial markings and nodularity within the left upper lobe suspicious for metastatic involvement and lymphangitic spread of  carcinoma.  The left upper lobe bronchus is completely occluded presumably by tumor invasion. A small nodular opacity is present just above hemidiaphragm at the left lung base.  This most likely represents a pleural plaque, but a metastatic lesion cannot be excluded.  On soft tissue window images, the thyroid gland is unremarkable. The left upper lobe mass abuts the mediastinal contours with some loss of fat planes suggesting possible mediastinal invasion.  The superior mediastinal lymph node has short-axis diameter of 9 mm. No other mediastinal or hilar adenopathy is seen.  The pulmonary arteries and thoracic aorta opacify with no acute abnormality noted.    IMPRESSION:  1.  Spiculated mass within the medial left upper lobe abutting the mediastinum consistent with primary lung carcinoma. 2.  Prominent interstitial markings and nodularity within the left upper lobe may represent metastatic involvement and or lymphangitic spread of carcinoma. 3.  Slightly prominent superior mediastinal lymph node.  No other evidence of adenopathy. 4.  Diffuse changes of centrilobular emphysema.  Original Report Authenticated By: Juline Patch, M.D.   #chest mass  - please have PET scan in < 1 week  - that will help [place] decide next biopsy procedure  #cough and shortness of breath  - please have full PFT breathing test at [facility] or [facility] < 1 week  #Followup  < 1 week afte PET scan - if delays let me or my nurses know  If unable to see < 1 week, then I will call to discuss results and next step    OV 11/18/2011  No new  complaints. Feels well; just with baseline issues of last vsiti.   Thinks she is losing weight but indeed she is gaining weight.   PET scan Nm Pet Image Initial (pi) Skull Base To Thigh: shows significant improvement of LUL mass. Only residual is a small left hilar node that is still PET HOT (this is done 1 month from date of CT chest)  11/07/2011  *RADIOLOGY REPORT*  Clinical Data:  Initial treatment  strategy for chest mass.  NUCLEAR MEDICINE PET CT INITIAL (PI) SKULL BASE TO THIGH  Technique:  18.9 mCi F-18 FDG was injected intravenously via the right antecubital fossa.  Full-ring PET imaging was performed from the skull base through the mid-thighs 61  minutes after injection. CT data was obtained and used for attenuation correction and anatomic localization only.  (This was not acquired as a diagnostic CT examination.)  Fasting Blood Glucose:  95  Patient Weight:  116 pounds.  Comparison:  CT chest 10/01/2011.  Findings: There has been marked interval improvement in left upper lobe air space consolidation.  Residual nodularity and volume loss in the left upper lobe shows mild hypermetabolism.  Focal hypermetabolism in the left hilum has an S U V max of 10.7.  There is corresponding soft tissue density within the left upper lobe bronchus (CT image 79). This finding is also seen on 10/01/2011. No additional areas of abnormal hypermetabolism in the neck, chest, abdomen or pelvis.  CT images were performed for attenuation correction.  No acute findings in the neck.  No pericardial or pleural effusion.  No acute findings in the abdomen or pelvis.  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  IMPRESSION:  1.  Interval improvement in left upper lobe air space consolidation with residual peribronchovascular nodularity and volume loss as well as mild corresponding hypermetabolism. 2.  Intense hypermetabolism in the left hilum corresponds to soft tissue or debris in the left upper lobe bronchus, also seen on 10/01/2011.  Continued follow-up to clearing or bronchoscopy recommended, as a centrally obstructing lesion is considered.  Original Report Authenticated By: Reyes Ivan, M.D.    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 FEv1 1L/54%., 14% BD response on FVC, ratio 54, DLCO 42%   Past, Family, Social reviewed: no change since last   #COPD  - you have copd that is moderate    - nurse will chagne pro-air to generic ventolin  - Please start spiriva 1 puff daily - take sample, script and show technique  - glad you had flu shot  - take pneumovax vaccine today  #Lung Mass  - this is much improved but not fully gone  - please have CT chest with contrast in 2 months for mediastinal node  #Followup  - 2 months iwht cT chest  - return sooner if there are problems  OV 01/14/2012 Body mass index is 22.85 kg/(m^2).  Gained weight Stable class 2 dyspnea  No new health problems Here to discuss results of CT - the LLUL infiltrate shows continued improvement but there is suspicion of endobronchial lesion. Bronch needed  Ct Chest W Contrast  01/14/2012  *RADIOLOGY REPORT*  Clinical Data: Follow up for left upper lobe endobronchial lesion noted on prior CT scans.  CT CHEST WITH CONTRAST  Technique:  Multidetector CT imaging of the chest was performed following the standard protocol during bolus administration of intravenous contrast.  Contrast:  80 ml of Omnipaque-300.  Comparison: PET CT dated 11/07/2011.  Findings:  Mediastinum: Heart size is normal. There is no significant pericardial fluid, thickening or pericardial calcification. Prominent nodal tissue is again noted in the left hilar region measuring up to 8 mm in short axis (similar to prior).  No pathologically enlarged right hilar or mediastinal lymph nodes are noted. There is atherosclerosis of the thoracic aorta, the great vessels of the mediastinum and the coronary arteries, including calcified atherosclerotic plaque in the left main, left circumflex and right coronary arteries. There is a small hiatal hernia.  Lungs/Pleura:  Again noted is soft tissue filling the left upper lobe bronchi extending to the apicoposterior segment.  This soft tissue is similar to the original examination from 10/01/2011, and corresponds with an area of hypermetabolic activity noted on recent PET CT dated 11/07/2011.  There continues to be some  mild postobstructive changes in the apicoposterior segment of the left upper lobe, compatible with postobstructive pneumonitis.  There is a background of moderate centrilobular emphysema and mild diffuse bronchial wall thickening.  No pleural effusions.  Upper Abdomen: Unremarkable.  Musculoskeletal: There are no aggressive appearing lytic or blastic lesions noted in the visualized portions of the skeleton.  IMPRESSION: 1.  Persistent soft tissue filling the bronchi to the apicoposterior segment of the left upper lobe, with persistent postobstructive pneumonitis in the same distribution, as detailed above.  Given the hypermetabolic activity on the recent PET CT, this finding is highly concerning for an endobronchial obstructing neoplasm, and further evaluation with bronchoscopy and bronchoscopic guided biopsy is recommended at this time. 2.  Borderline enlarged left hilar lymph node measuring 8 mm in short axis may be reactive, or could suggest the metastatic nodal involvement as well. 3.  No new mediastinal or contralateral hilar nodal enlargement is identified.  4. 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 Please note that although the presence of coronary artery calcium documents the presence of coronary artery disease, the severity of this disease and any potential stenosis cannot be assessed on this non-gated CT examination.  Assessment for potential risk factor modification, dietary therapy or pharmacologic therapy may be warranted, if clinically indicated. 5.  Small hiatal hernia. 6.  Background of moderate centrilobular emphysema and mild diffuse bronchial wall thickening; changes compatible with underlying COPD.  These results were called by telephone on 01/14/2012  at  2:23 p.m. to  Dr. Marchelle Gearing, who verbally acknowledged these results.  Original Report Authenticated By: Florencia Reasons, M.D.      Review of Systems  Constitutional: Negative for fever and  unexpected weight change.  HENT: Negative for ear pain, nosebleeds, congestion, sore throat, rhinorrhea, sneezing, trouble swallowing, dental problem, postnasal drip and sinus pressure.   Eyes: Negative for redness and itching.  Respiratory: Positive for shortness of breath. Negative for cough, chest tightness and wheezing.   Cardiovascular: Negative for palpitations and leg swelling.  Gastrointestinal: Negative for nausea and vomiting.  Genitourinary: Negative for dysuria.  Musculoskeletal: Negative for joint swelling.  Skin: Negative for rash.  Neurological: Negative for headaches.  Hematological: Does not bruise/bleed easily.  Psychiatric/Behavioral: Negative for dysphoric mood. The patient is not nervous/anxious.        Objective:   Physical Exam Vitals reviewed. Constitutional: She is oriented to person, place, and time. She appears well-developed and well-nourished. No distress.       Body mass index is 19.98 kg/(m^2). - Last ov Cachexia Body mass index is 20.43 kg/(m^2). 11/18/2011    HENT:  Head: Normocephalic and atraumatic.  Right Ear: External ear  normal.  Left Ear: External ear normal.  Mouth/Throat: Oropharynx is clear and moist. No oropharyngeal exudate.  Eyes: Conjunctivae and EOM are normal. Pupils are equal, round, and reactive to light. Right eye exhibits no discharge. Left eye exhibits no discharge. No scleral icterus.  Neck: Normal range of motion. Neck supple. No JVD present. No tracheal deviation present. No thyromegaly present.  Cardiovascular: Normal rate, regular rhythm, normal heart sounds and intact distal pulses.  Exam reveals no gallop and no friction rub.   No murmur heard. Pulmonary/Chest: Effort normal and breath sounds normal. No respiratory distress. She has no wheezes. She has no rales. She exhibits no tenderness.  Abdominal: Soft. Bowel sounds are normal. She exhibits no distension and no mass. There is no tenderness. There is no rebound and no  guarding.  Musculoskeletal: Normal range of motion. She exhibits no edema and no tenderness.       RA+  Lymphadenopathy:    She has no cervical adenopathy.  Neurological: She is alert and oriented to person, place, and time. She has normal reflexes. No cranial nerve deficit. She exhibits normal muscle tone. Coordination normal.  Skin: Skin is warm and dry. No rash noted. She is not diaphoretic. No erythema. No pallor.  Psychiatric: She has a normal mood and affect. Her behavior is normal. Judgment and thought content normal.       Spiritual +         Assessment & Plan:

## 2012-01-28 NOTE — Discharge Instructions (Signed)
Please have someone to drive you home Please be careful with activities for next 24 hours You can eat 2-4 hours after getting home provided you are fully alert, able to cough, and are not nauseated or vomiting and     feel well You are expected to have low grade fever or cough some amount of blood for next 24-48 hours; if this worsens call us IF you are very short of breath or coughing blood or chest pain or not feeling well, call us [phone] anytime or go to emergency room Please call [phone] for followup appointment or we will call you and fix one Bronchoscopy Bronchoscopy is a procedure for diagnosing problems in the lungs. It allows your caregiver to examine the passageways in the lungs by direct exam. This is accomplished by the use of a flexible telescope-like tool (flexible fiberoptic bronchoscope), which is passed down to the air passageways to be examined.  LET YOUR CAREGIVER KNOW ABOUT:   Allergies to food or medicine.   Medicines taken, including vitamins, herbs, eyedrops, over-the-counter medicines, and creams.   Use of steroids (by mouth or creams).   Previous problems with anesthetics or numbing medicines.   History of bleeding problems or blood clots.   Previous surgery.   Other health problems, including diabetes and kidney problems.   Possibility of pregnancy, if this applies.  BEFORE THE PROCEDURE   Do not eat or drink anything 8 hours before the test.   Medications may be given to relax you, dry up your secretions, and to control coughing. The medication which dries up secretions will make your mouth very dry.   Local anesthetics (medications) are given to numb your mouth, nose, throat and voice box (larynx).  PROCEDURE   Relax as much as possible during the procedure.   Follow the caregiver's instructions to speed up the procedure.   Your breathing is not blocked (obstructed) during this procedure. You will be able to breath normally during the procedure.     If tissue samples (biopsies) are needed in the outer portions of the lung, sometimes a special type of X-ray (fluoroscopy) is required.   Abnormal areas will be brushed or biopsied for exam under a microscope.   If any bleeding occurs from these areas, a drug may be used to make the blood vessels smaller (constrict) to stop or decrease the bleeding.   You may receive a chest X-ray following the procedure to make sure the lungs have not collapsed (pneumothorax).  AFTER THE PROCEDURE   You may resume normal activities.   Call the caregiver or return for an appointment as directed if biopsies were taken.   Do not eat or drink anything until cough and gag reflexes have returned. There is danger of burning yourself or getting food or water into your lungs when your mouth and airways are numb. After the numbness is gone, you may begin taking a normal diet.  SEEK IMMEDIATE MEDICAL CARE IF:   You become lightheaded.   You get short of breath.   You become faint.   You develop chest pain.   You cough up blood.  Document Released: 10/04/2000 Document Revised: 09/26/2011 Document Reviewed: 01/28/2008 [employer] Patient Information 2012 [place], [place].

## 2012-01-28 NOTE — Op Note (Addendum)
Name:  Destiny Harrison MRN:  [id number] DOB:  21-Nov-1940  PROCEDURE NOTE  Procedure(s): Flexible bronchoscopy [phone]) with video method Brushing (65784) of the Left Upper [place] Posterior Apical Segment  Bronchial alveolar lavage (69629) of the Left Upper [place] Posterior Apical Segment  Endobronchial biopsy (52841) of the Left Upper Lobe Posterior Apical Segment entrance   Indications: Left Upper Lobe pneumonia with PET HOT lesion endbronchial lesion. Concern for malignancy Consent:  Procedure, benefits, risks and alternatives discussed.  Questions answered.  Consent obtained.  Anesthesia:  Topical lidocaine and fentanyl and versed moderate sedation  Procedure summary:  Appropriate equipment was assembled.  The patient was brought to the endoscopy suite room and identified as [name].  Safety timeout was performed. The patient was placed supine on the operating table, airway established and moderate sedation administered by Dr Marchelle Gearing and team   After the appropriate level of anesthesia was assured, flexible video bronchoscope was lubricated and inserted through the right naris.  Total of 10 mL of 1% Lidocaine were administered through the bronchoscope to augment moderate sedation  Airway examination was performed bilaterally to subsegmental level.  Minimal clear secretions were noted, mucosa appeared normal and no endobronchial lesions were identified except in Left upper Lobe entrance - causing complete occulusion was a white mucoid hard soft tissue.   6 pieces of biopsy of the endobronchial lesion done. Lesion noted to be friable and easy oozing of blood but these biopsies resulted in opening up of the airway and now the lumen looked corrugated with friable tissues. Following this brush for cytology x 2 was done of the area. And then 30cc x 3 Bronchial alveolar lavage of the Left Upper Lobe  was performed with return of 25 mL of fluid, after which repeat endobronchial biopsy of the same  place was done . After this the bronchoscope was withdrawn.   Post-procedure chest x-ray was NOT ordered. Total fentanyl given 62,17mcg and Versed 4mg   Specimens sent: Bronchial alveolar lavage specimen of the Left Lupper Lobe, brushings and endobronchial biopsy for cytology and histopathology.    Complications:  No immediate complications were noted.  Hemodynamic parameters and oxygenation remained stable throughout the procedure.  Estimated blood loss:  Less then 3 mL.  Post Procedure Plan:  Please have someone to drive you home Please be careful with activities for next 24 hours You can eat 2-4 hours after getting home provided you are fully alert, able to cough, and are not nauseated or vomiting and     feel well You are expected to have low grade fever or cough some amount of blood for next 24-48 hours; if this worsens call us IF you are very short of breath or coughing blood or chest pain or not feeling well, call us [phone] anytime or go to emergency room Please call [phone] for followup appointment or we will call you and fix one    Dr. Kalman Shan, M.D., [facility].C.P Pulmonary and Critical Care Medicine Staff Physician [facility] [place] [facility] Pager: [phone], If no answer or between  15:00h - 7:00h: call [phone]  01/28/2012 8:44 AM

## 2012-01-28 NOTE — Progress Notes (Signed)
Video bronchoscopy with bal intervention, brushing intervention, biopsy intervention

## 2012-01-28 NOTE — Telephone Encounter (Signed)
I called Mary on cytology and she states never mind she called the wrong place. Carron Curie, CMA

## 2012-01-28 NOTE — Interval H&P Note (Signed)
01/28/2012 at 8:07 AM : Since last visit no change. Feels the same. Currently NPO and ok for procedure. BP Okay. Risks of bleeding, pneumothorax, bleeding, sedation issues of resp and cardiac arrest and risk of non diagnosis discussed again. Verbalized understanding and willing to proceed

## 2012-01-29 ENCOUNTER — Encounter (HOSPITAL_COMMUNITY): Payer: Medicare Other

## 2012-01-29 ENCOUNTER — Telehealth: Payer: Self-pay | Admitting: Internal Medicine

## 2012-01-29 ENCOUNTER — Encounter (HOSPITAL_COMMUNITY): Payer: Self-pay | Admitting: Internal Medicine

## 2012-01-29 NOTE — Telephone Encounter (Signed)
Please give her appt asap 4/19 ok; overbook if needed to get diagnosis of lung biopsy

## 2012-01-29 NOTE — Telephone Encounter (Signed)
appt made for 02-07-12 at 4pm. Carron Curie, CMA

## 2012-01-31 ENCOUNTER — Telehealth: Payer: Self-pay | Admitting: Internal Medicine

## 2012-01-31 LAB — CULTURE, BAL-QUANTITATIVE W GRAM STAIN
Colony Count: 85000
Gram Stain: NONE SEEN

## 2012-01-31 MED ORDER — LEVOFLOXACIN 500 MG PO TABS: 500.0000 mg | ORAL_TABLET | Freq: Every day | ORAL | Status: DC

## 2012-01-31 NOTE — Telephone Encounter (Signed)
BAL results show moraxella. So, I had to call her and go over all results in phone. She is total ok with results over the phone.  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 Discussed at [facility] yesterday. BAsed on my presentation Dr Edwyna Shell and Dr Dorris Fetch felt not an operative candidate. REfer to Dr  Theodoro Grist and Rad onc Dr Michell Heinrich for radiation  B) moraxellan in lung  - please send levaquin 500mg  daily x 5 days; if expensive call us  C) Followup  - cancel 4/19 - give fu  In 2 months

## 2012-01-31 NOTE — Telephone Encounter (Signed)
Called and spoke with pt and appt has been made for her to see MR in June--2 month follow up.  levaquin has been sent to the pts pharmacy and pt is aware.  Nothing further needed.

## 2012-01-31 NOTE — Telephone Encounter (Signed)
Attempted to call pt but no answer and no machine.  Will try back later to check her pharmacy before sending in rx for the levaquin and to schedule appt.

## 2012-01-31 NOTE — Telephone Encounter (Signed)
Pt returned Leigh's call & can be reached at [phone].  Destiny Harrison

## 2012-02-03 ENCOUNTER — Telehealth: Payer: Self-pay | Admitting: Internal Medicine

## 2012-02-03 DIAGNOSIS — C3490 Malignant neoplasm of unspecified part of unspecified bronchus or lung: Secondary | ICD-10-CM

## 2012-02-03 NOTE — Telephone Encounter (Signed)
I spoke with the pt and she states she spoke with MR on 4-12 and he explained diagnosis and an rx was called in for infection. She states she has not heard about appt from oncology. I looked and referral was not placed. I placed order and advised the pt that she will be contacted with appt. Carron Curie, CMA

## 2012-02-03 NOTE — Telephone Encounter (Signed)
lmtcb x1 for pt. 

## 2012-02-03 NOTE — Telephone Encounter (Signed)
Pt returned call. Destiny Harrison  

## 2012-02-04 ENCOUNTER — Encounter: Payer: Self-pay | Admitting: *Deleted

## 2012-02-04 ENCOUNTER — Telehealth: Payer: Self-pay | Admitting: *Deleted

## 2012-02-04 NOTE — Telephone Encounter (Signed)
Spoke with pt regarding appt for mtoc 02/05/12 at 2:00.  She verbalized understanding of time and place of appt

## 2012-02-04 NOTE — Progress Notes (Signed)
Appt 02/06/12 pt is aware

## 2012-02-06 ENCOUNTER — Ambulatory Visit (HOSPITAL_BASED_OUTPATIENT_CLINIC_OR_DEPARTMENT_OTHER): Payer: Medicare Other | Admitting: Internal Medicine

## 2012-02-06 ENCOUNTER — Encounter: Payer: Self-pay | Admitting: *Deleted

## 2012-02-06 ENCOUNTER — Encounter: Payer: Self-pay | Admitting: Internal Medicine

## 2012-02-06 VITALS — BP 149/78 | HR 110 | Temp 98.4°F | Resp 18 | Ht 65.0 in | Wt 125.0 lb

## 2012-02-06 DIAGNOSIS — C3490 Malignant neoplasm of unspecified part of unspecified bronchus or lung: Secondary | ICD-10-CM | POA: Diagnosis not present

## 2012-02-06 DIAGNOSIS — J449 Chronic obstructive pulmonary disease, unspecified: Secondary | ICD-10-CM

## 2012-02-06 DIAGNOSIS — M069 Rheumatoid arthritis, unspecified: Secondary | ICD-10-CM

## 2012-02-06 DIAGNOSIS — C3411 Malignant neoplasm of upper lobe, right bronchus or lung: Secondary | ICD-10-CM

## 2012-02-06 DIAGNOSIS — J4489 Other specified chronic obstructive pulmonary disease: Secondary | ICD-10-CM

## 2012-02-06 HISTORY — DX: Malignant neoplasm of upper lobe, right bronchus or lung: C34.11

## 2012-02-06 NOTE — Progress Notes (Signed)
[facility] Psychosocial Distress Screening Clinical Social Work  Clinical Social Work was referred by distress screening protocol.  The patient scored a 6 on the Psychosocial Distress Thermometer which indicates moderate distress. Clinical Social Worker met with patient and MD in exam room at [facility] to assess for distress and other psychosocial needs. MD discussed diagnosis and treatment plan with the patient.  Ms. Bielicki verbalized understanding and is ready to begin treatment.  She states that she "has no worries" but is concerned for her family.  She reports they are concerned, but she relies heavily on her faith in God, and that this is "in His hands". 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  She states they are her priority, we briefly discussed the importance in self-care. CSW discussed support services at [facility] and encouraged patien to call if any needs arise in the future for herself or her family.   Clinical Social Worker follow up needed: no  If yes, follow up plan:  Kathrin Penner, MSW, [facility] Clinical Social Worker [facility] [phone]

## 2012-02-06 NOTE — Progress Notes (Signed)
Destiny Harrison [phone]   [phone]  CONSULT NOTE  REASON FOR CONSULTATION:  72 years old [place] American female diagnosed with lung cancer.  HPI Destiny Harrison is a 71 y.o. female was past medical history significant for rheumatoid arthritis currently on treatment with methotrexate and prednisone as well as history of COPD and long history of smoking but quit in 1992. The patient mentions at 5 months ago she has been complaining of increasing cough and shortness of breath. She had chest x-ray performed at that time that was suspicious for pneumonia. She was treated by her primary care physician but no significant improvement. The patient then had CT scan of the chest performed on 09/30/2011 and it showed that within the left upper lobe medially there is a spiculated mass present of approximately 3.3 x 2.8 x 6.1 cm consistent with primary lung carcinoma. Prominent interstitial markings and nodularity within the left upper lobe suspicious for metastatic involvement and lymphangitic spread of carcinoma. The left upper lobe bronchus is completely occluded presumably by tumor invasion. There was also a small nodular opacity is present just above hemidiaphragm at the left lung base. This most likely represents a pleural plaque, but a metastatic lesion cannot be excluded in addition to a slightly prominent superior mediastinal lymph node. The patient was referred to Dr. Marchelle Gearing and a PET scan was performed on 11/07/2011 and it showed Interval improvement in left upper lobe air space consolidation with residual peribronchovascular nodularity and volume loss as well as mild corresponding hypermetabolism. Intense hypermetabolism in the left hilum corresponds to soft tissue or debris in the left upper lobe bronchus. On 01/13/2014 the patient had repeat CT scan of the chest with contrast and it showed Persistent soft tissue filling the bronchi to the  apicoposterior segment of  the left upper lobe, with persistent postobstructive pneumonitis in the same distribution, Given the hypermetabolic activity on the recent PET CT, this finding is highly concerning for an endobronchial obstructing  neoplasm, and further evaluation with bronchoscopy and bronchoscopic guided biopsy is recommended at this time.  Borderline enlarged left hilar lymph node measuring 8 mm in short axis may be reactive, or could suggest the metastatic nodal involvement as well. No new mediastinal or contralateral hilar nodal enlargement is identified. On 08/29/2014 the patient underwent a video bronchoscopy with brushings and bronchoalveolar lavage as well as endobronchial biopsy of the left upper lobe posterior apical segment entrance under the care of Dr. Marchelle Gearing. 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 Dr. Marchelle Gearing kindly referred the patient to me today for evaluation and recommendation regarding treatment of her condition. The patient was discussed at the weekly thoracic conference and cardiothoracic surgeons indicated that the patient is not a surgical candidate for resection. The patient is feeling fine today except for productive cough of whitish sputum and shortness of breath with exertion. She denied having any significant chest pain or weight loss. He has no visual changes or headache.  The patient is married and has 3 living children. She takes care of her husband as well as a son who was undergoing hemodialysis. She used to work in a factory. She has a history of smoking for around 30 years but quit in 1992.  @SFHPI @  Past Medical History  Diagnosis Date  . COPD (chronic obstructive pulmonary disease)   . Cancer     Past Surgical History  Procedure Date  . Appendex 1962  . Video bronchoscopy 01/28/2012  Procedure: VIDEO BRONCHOSCOPY WITHOUT FLUORO;  Surgeon: Kalman Shan, MD;  Location: [facility] ENDOSCOPY;  Service: Endoscopy;  Laterality: Bilateral;    Family  History  Problem Relation Age of Onset  . Diabetes Mother     insulin dependent  . Breast cancer Mother     Social History History  Substance Use Topics  . Smoking status: Former Smoker -- 0.5 packs/day for 40 years    Types: Cigarettes    Quit date: 10/29/2009  . Smokeless tobacco: Never Used  . Alcohol Use: No    No Known Allergies  Current Outpatient Prescriptions  Medication Sig Dispense Refill  . albuterol (PROVENTIL HFA;VENTOLIN HFA) 108 (90 BASE) MCG/ACT inhaler Inhale 2 puffs into the lungs every 6 (six) hours as needed.        . calcium carbonate 200 MG capsule Take 250 mg by mouth daily.        . Cholecalciferol (VITAMIN D PO) Take by mouth. 5000 units qd        . folic acid (FOLVITE) 1 MG tablet Take 1 mg by mouth daily.        Marland Kitchen HYDROcodone-acetaminophen (VICODIN) 5-500 MG per tablet Take 1 tablet by mouth every 6 (six) hours as needed.        . methotrexate 25 MG/ML SOLN 25 mg once. 1ml once weekly       . predniSONE (DELTASONE) 5 MG tablet Take 5 mg by mouth daily.        . Simvastatin (ZOCOR PO) Take 5 mg by mouth once.        . tiotropium (SPIRIVA HANDIHALER) 18 MCG inhalation capsule Place 1 capsule (18 mcg total) into inhaler and inhale daily.  30 capsule  2    Review of Systems  A comprehensive review of systems was negative except for: Constitutional: positive for fatigue Respiratory: positive for cough, dyspnea on exertion, sputum and wheezing  Physical Exam  QIH:KVQQV, healthy, no distress, well nourished and well developed SKIN: skin color, texture, turgor are normal HEAD: Normocephalic, No masses, lesions, tenderness or abnormalities EYES: normal EARS: External ears normal OROPHARYNX:no exudate and no erythema  NECK: supple, no adenopathy LYMPH:  no palpable lymphadenopathy, no hepatosplenomegaly BREAST:not examined LUNGS: clear to auscultation  HEART: regular rate & rhythm, no murmurs and no gallops ABDOMEN:abdomen soft, non-tender, normal  bowel sounds and no masses or organomegaly BACK: Back symmetric, no curvature. EXTREMITIES:no joint deformities, effusion, or inflammation, no edema, no skin discoloration, no clubbing, no cyanosis  NEURO: alert & oriented x 3 with fluent speech, no focal motor/sensory deficits  PERFORMANCE STATUS: ECOG 1  Studies/Results: Ct Chest W Contrast  01/14/2012  *RADIOLOGY REPORT*  Clinical Data: Follow up for left upper lobe endobronchial lesion noted on prior CT scans.  CT CHEST WITH CONTRAST  Technique:  Multidetector CT imaging of the chest was performed following the standard protocol during bolus administration of intravenous contrast.  Contrast:  80 ml of Omnipaque-300.  Comparison: PET CT dated 11/07/2011.  Findings:  Mediastinum: Heart size is normal. There is no significant pericardial fluid, thickening or pericardial calcification. Prominent nodal tissue is again noted in the left hilar region measuring up to 8 mm in short axis (similar to prior).  No pathologically enlarged right hilar or mediastinal lymph nodes are noted. There is atherosclerosis of the thoracic aorta, the great vessels of the mediastinum and the coronary arteries, including calcified atherosclerotic plaque in the left main, left circumflex and right coronary arteries. There is a small hiatal hernia.  Lungs/Pleura:  Again noted is soft tissue filling the left upper lobe bronchi extending to the apicoposterior segment.  This soft tissue is similar to the original examination from 10/01/2011, and corresponds with an area of hypermetabolic activity noted on recent PET CT dated 11/07/2011.  There continues to be some mild postobstructive changes in the apicoposterior segment of the left upper lobe, compatible with postobstructive pneumonitis.  There is a background of moderate centrilobular emphysema and mild diffuse bronchial wall thickening.  No pleural effusions.  Upper Abdomen: Unremarkable.  Musculoskeletal: There are no aggressive  appearing lytic or blastic lesions noted in the visualized portions of the skeleton.  IMPRESSION: 1.  Persistent soft tissue filling the bronchi to the apicoposterior segment of the left upper lobe, with persistent postobstructive pneumonitis in the same distribution, as detailed above.  Given the hypermetabolic activity on the recent PET CT, this finding is highly concerning for an endobronchial obstructing neoplasm, and further evaluation with bronchoscopy and bronchoscopic guided biopsy is recommended at this time. 2.  Borderline enlarged left hilar lymph node measuring 8 mm in short axis may be reactive, or could suggest the metastatic nodal involvement as well. 3.  No new mediastinal or contralateral hilar nodal enlargement is identified.  4. 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 Please note that although the presence of coronary artery calcium documents the presence of coronary artery disease, the severity of this disease and any potential stenosis cannot be assessed on this non-gated CT examination.  Assessment for potential risk factor modification, dietary therapy or pharmacologic therapy may be warranted, if clinically indicated. 5.  Small hiatal hernia. 6.  Background of moderate centrilobular emphysema and mild diffuse bronchial wall thickening; changes compatible with underlying COPD.  These results were called by telephone on 01/14/2012  at  2:23 p.m. to  Dr. Marchelle Gearing, who verbally acknowledged these results.  Original Report Authenticated By: Florencia Reasons, M.D.     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  PLAN: I had a lengthy discussion with the patient today about her disease stage, prognosis and treatment  options. I recommended for the patient a course of concurrent chemoradiation with weekly carboplatin for AUC of 2 and paclitaxel 45 mg/M2 for a total of 6-7 weeks depending on the dose of radiation. I discussed with the patient adverse effect of this treatment including but not limited to alopecia, myelosuppression, peripheral neuropathy, nausea and vomiting, liver or dysfunction. The patient would see Dr. Michell Heinrich next week for evaluation and discussion of her radiotherapy option. I expect the patient to start the first cycle of her treatment on 02/18/2012.  She would have a chemotherapy education class next week. I would complete his staging workup I ordered an MRI of the brain with and without contrast to rule out brain metastases. The patient would come back for followup visit in 3 weeks for evaluation and management any adverse effect of her chemotherapy. I gave the patient the time to ask questions and I answered them completely to her satisfaction. The patient was advised to call me immediately if she has any concerning symptoms in the interval. All questions were answered. The patient knows to call the clinic with any problems, questions or concerns. We can certainly see the patient much sooner if necessary.  Thank you  so much for allowing me to participate in the care of Destiny Harrison. I will continue to follow up the patient with you and assist in her care.  I spent 30 minutes counseling the patient face to face. The total time spent in the appointment was 60 minutes.   Karleigh Bunte K. 02/06/2012, 3:12 PM

## 2012-02-07 ENCOUNTER — Encounter: Payer: Self-pay | Admitting: *Deleted

## 2012-02-07 ENCOUNTER — Ambulatory Visit: Payer: Medicare Other | Admitting: Internal Medicine

## 2012-02-07 NOTE — Progress Notes (Signed)
Spoke with pt at multidisciplinary clinic 02/06/12.  Distress and nutrition screenings completed.  Educational resources given.  Questions and and  concerns answered.

## 2012-02-12 ENCOUNTER — Ambulatory Visit
Admission: RE | Admit: 2012-02-12 | Discharge: 2012-02-12 | Disposition: A | Discharge status: Home / Self Care | Payer: Medicare Other | Source: Ambulatory Visit | Attending: Radiation Oncology | Admitting: Radiation Oncology

## 2012-02-12 ENCOUNTER — Other Ambulatory Visit: Payer: Self-pay | Admitting: Internal Medicine

## 2012-02-12 ENCOUNTER — Encounter: Payer: Self-pay | Admitting: Radiation Oncology

## 2012-02-12 VITALS — BP 123/63 | HR 77 | Temp 99.0°F | Resp 18 | Ht 65.0 in | Wt 135.2 lb

## 2012-02-12 DIAGNOSIS — C3490 Malignant neoplasm of unspecified part of unspecified bronchus or lung: Secondary | ICD-10-CM | POA: Diagnosis not present

## 2012-02-12 DIAGNOSIS — Z510 Encounter for antineoplastic radiation therapy: Secondary | ICD-10-CM | POA: Diagnosis not present

## 2012-02-12 DIAGNOSIS — J449 Chronic obstructive pulmonary disease, unspecified: Secondary | ICD-10-CM | POA: Diagnosis not present

## 2012-02-12 DIAGNOSIS — Z79899 Other long term (current) drug therapy: Secondary | ICD-10-CM | POA: Insufficient documentation

## 2012-02-12 DIAGNOSIS — J4489 Other specified chronic obstructive pulmonary disease: Secondary | ICD-10-CM | POA: Insufficient documentation

## 2012-02-12 DIAGNOSIS — C3410 Malignant neoplasm of upper lobe, unspecified bronchus or lung: Secondary | ICD-10-CM | POA: Diagnosis not present

## 2012-02-12 NOTE — Progress Notes (Addendum)
PATIENT IS HERE TODAY FOR CONSULT OF LUNG CANCER LEFT UPPER LOBE, SQUAMOUS CELL.  SHE IS NOT A SURGICAL CANDIDATE, SO THE PLAN IS,   SHE IS TO GET CONCURRENT CHEMO AND RADIATION.  THE PLAN FOR CHEMO IS WEEKLY CARBOPLATIN FOR AUC OF 2 AND PACLITAXEL 45MG /M2 FOR A TOTAL OF 6 - 7 WEEKS DEPENDING ON DOSE OF RADIATION.  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

## 2012-02-13 ENCOUNTER — Telehealth: Payer: Self-pay | Admitting: *Deleted

## 2012-02-13 ENCOUNTER — Other Ambulatory Visit: Payer: Medicare Other

## 2012-02-13 ENCOUNTER — Encounter: Payer: Self-pay | Admitting: *Deleted

## 2012-02-13 ENCOUNTER — Other Ambulatory Visit (HOSPITAL_BASED_OUTPATIENT_CLINIC_OR_DEPARTMENT_OTHER): Payer: Medicare Other | Admitting: Lab

## 2012-02-13 DIAGNOSIS — C3490 Malignant neoplasm of unspecified part of unspecified bronchus or lung: Secondary | ICD-10-CM | POA: Diagnosis not present

## 2012-02-13 LAB — COMPREHENSIVE METABOLIC PANEL
Alkaline Phosphatase: 77 U/L (ref 39–117)
BUN: 12 mg/dL (ref 6–23)
CO2: 29 mEq/L (ref 19–32)
Glucose, Bld: 91 mg/dL (ref 70–99)
Sodium: 138 mEq/L (ref 135–145)
Total Bilirubin: 0.5 mg/dL (ref 0.3–1.2)
Total Protein: 6.7 g/dL (ref 6.0–8.3)

## 2012-02-13 LAB — CBC WITH DIFFERENTIAL/PLATELET
Basophils Absolute: 0 10*3/uL (ref 0.0–0.1)
EOS%: 3.6 % (ref 0.0–7.0)
Eosinophils Absolute: 0.2 10*3/uL (ref 0.0–0.5)
HCT: 35 % (ref 34.8–46.6)
HGB: 11.8 g/dL (ref 11.6–15.9)
LYMPH%: 39.2 % (ref 14.0–49.7)
MCH: 26.6 pg (ref 25.1–34.0)
MCV: 79 fL — ABNORMAL LOW (ref 79.5–101.0)
MONO%: 14.1 % — ABNORMAL HIGH (ref 0.0–14.0)
NEUT#: 2.3 10*3/uL (ref 1.5–6.5)
NEUT%: 42.5 % (ref 38.4–76.8)
Platelets: 360 10*3/uL (ref 145–400)

## 2012-02-13 LAB — TECHNOLOGIST REVIEW

## 2012-02-13 NOTE — Progress Notes (Signed)
Radiation Oncology         [phone]) [phone] ________________________________  Initial outpatient Consultation  Name: Destiny Harrison MRN: [id number]  Date: 02/12/2012  DOB: [birth year]  NW:GNFAOZHY,QMVHQI B, MD, MD  Kalman Shan, MD   REFERRING PHYSICIAN: Kalman Shan, MD  DIAGNOSIS: The encounter diagnosis was Lung cancer.  HISTORY OF PRESENT ILLNESS::Destiny Harrison is a 71 y.o. female who  returns today for my opinion regarding the role for radiation and management of her newly diagnosed lung cancer. Her history is somewhat difficult to figure out. She had complained of cough and shortness of breath in December and was placed on antibiotics. A CT of the chest was performed on December 10 which showed a left upper lobe mass measuring about 3.3 x 2.8 x 6.1 cm. The left upper lobe bronchus was completely occluded presumably by tumor invasion. Interstitial markings and nodularity were suspicious for lymphangitic spread of tumor. A PET scan was performed on 11/07/2011 which showed improvement in the left upper lobe airspace consolidation with intense hypermetabolic activity in the left hilum. A repeat CT of the chest was performed on 01/13/2014 which showed again that soft tissue filling of the left upper lobe bronchus with postobstructive pneumonitis. Borderline enlarged left hilar lymph node was noted as well. No mediastinal or her contralateral hilar nodal involvement was noted. She underwent bronchoscopy and biopsy by Dr. Marchelle Gearing on 01/28/2012. This revealed squamous cell carcinoma. She was then referred to Dr. Arbutus Ped for consideration of chemotherapy. He was presented at multidisciplinary tumor conference and was not felt to be a candidate for surgical resection.  She has relatively few complaints today. She states she's been losing weight. She has a productive cough but she's had for about 5 years. She has shortness of breath is with exertion which she says is stable to improved. She denies any  headaches visual changes or difficulties with balance. He has no bone pain.Marland Kitchen  PREVIOUS RADIATION THERAPY: No  PAST MEDICAL HISTORY:  has a past medical history of COPD (chronic obstructive pulmonary disease) and Cancer.    PAST SURGICAL HISTORY: Past Surgical History  Procedure Date  . Appendex 1962  . Video bronchoscopy 01/28/2012    Procedure: VIDEO BRONCHOSCOPY WITHOUT FLUORO;  Surgeon: Kalman Shan, MD;  Location: [facility] ENDOSCOPY;  Service: Endoscopy;  Laterality: Bilateral;  . Surgery on right wrist     FAMILY HISTORY: family history includes Breast cancer in her mother and Diabetes in her mother.  SOCIAL HISTORY:  reports that she quit smoking about 2 years ago. Her smoking use included Cigarettes. She has a 20 pack-year smoking history. She has never used smokeless tobacco. She reports that she does not drink alcohol or use illicit drugs.  ALLERGIES: Review of patient's allergies indicates no known allergies.  MEDICATIONS:  Current Outpatient Prescriptions  Medication Sig Dispense Refill  . albuterol (PROVENTIL HFA;VENTOLIN HFA) 108 (90 BASE) MCG/ACT inhaler Inhale 2 puffs into the lungs every 6 (six) hours as needed.        . calcium carbonate 200 MG capsule Take 250 mg by mouth daily.        . Cholecalciferol (VITAMIN D PO) Take by mouth. 5000 units qd        . folic acid (FOLVITE) 1 MG tablet Take 1 mg by mouth daily.        Marland Kitchen HYDROcodone-acetaminophen (VICODIN) 5-500 MG per tablet Take 1 tablet by mouth every 6 (six) hours as needed.        . methotrexate 25 MG/ML SOLN  25 mg once. 1ml once weekly      . predniSONE (DELTASONE) 5 MG tablet Take 5 mg by mouth daily.        . Simvastatin (ZOCOR PO) Take 5 mg by mouth once.        . tiotropium (SPIRIVA HANDIHALER) 18 MCG inhalation capsule Place 1 capsule (18 mcg total) into inhaler and inhale daily.  30 capsule  2    REVIEW OF SYSTEMS:  A 15 point review of systems is documented in the electronic medical record. This was  obtained by the nursing staff. However, I reviewed this with the patient to discuss relevant findings and make appropriate changes.  Pertinent items are noted in HPI.   PHYSICAL EXAM:  height is 5\' 5"  (1.651 m) and weight is 135 lb 3.2 oz (61.326 kg). Her oral temperature is 99 F (37.2 C). Her blood pressure is 123/63 and her pulse is 77. Her respiration is 18.   She appears her stated age. She is in no respiratory distress. She does cough frequently during her examination. She has rhonchi bilaterally. She is alert and oriented x3. Cranial nerves III through XII are tested and intact.  LABORATORY DATA:    RADIOGRAPHY: Ct Chest W Contrast  01/14/2012  *RADIOLOGY REPORT*  Clinical Data: Follow up for left upper lobe endobronchial lesion noted on prior CT scans.  CT CHEST WITH CONTRAST  Technique:  Multidetector CT imaging of the chest was performed following the standard protocol during bolus administration of intravenous contrast.  Contrast:  80 ml of Omnipaque-300.  Comparison: PET CT dated 11/07/2011.  Findings:  Mediastinum: Heart size is normal. There is no significant pericardial fluid, thickening or pericardial calcification. Prominent nodal tissue is again noted in the left hilar region measuring up to 8 mm in short axis (similar to prior).  No pathologically enlarged right hilar or mediastinal lymph nodes are noted. There is atherosclerosis of the thoracic aorta, the great vessels of the mediastinum and the coronary arteries, including calcified atherosclerotic plaque in the left main, left circumflex and right coronary arteries. There is a small hiatal hernia.  Lungs/Pleura:  Again noted is soft tissue filling the left upper lobe bronchi extending to the apicoposterior segment.  This soft tissue is similar to the original examination from 10/01/2011, and corresponds with an area of hypermetabolic activity noted on recent PET CT dated 11/07/2011.  There continues to be some mild postobstructive  changes in the apicoposterior segment of the left upper lobe, compatible with postobstructive pneumonitis.  There is a background of moderate centrilobular emphysema and mild diffuse bronchial wall thickening.  No pleural effusions.  Upper Abdomen: Unremarkable.  Musculoskeletal: There are no aggressive appearing lytic or blastic lesions noted in the visualized portions of the skeleton.  IMPRESSION: 1.  Persistent soft tissue filling the bronchi to the apicoposterior segment of the left upper lobe, with persistent postobstructive pneumonitis in the same distribution, as detailed above.  Given the hypermetabolic activity on the recent PET CT, this finding is highly concerning for an endobronchial obstructing neoplasm, and further evaluation with bronchoscopy and bronchoscopic guided biopsy is recommended at this time. 2.  Borderline enlarged left hilar lymph node measuring 8 mm in short axis may be reactive, or could suggest the metastatic nodal involvement as well. 3.  No new mediastinal or contralateral hilar nodal enlargement is identified.  4. 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 Please note that although the presence of coronary artery calcium documents the presence  of coronary artery disease, the severity of this disease and any potential stenosis cannot be assessed on this non-gated CT examination.  Assessment for potential risk factor modification, dietary therapy or pharmacologic therapy may be warranted, if clinically indicated. 5.  Small hiatal hernia. 6.  Background of moderate centrilobular emphysema and mild diffuse bronchial wall thickening; changes compatible with underlying COPD.  These results were called by telephone on 01/14/2012  at  2:23 p.m. to  Dr. Marchelle Gearing, who verbally acknowledged these results.  Original Report Authenticated By: Florencia Reasons, M.D.      IMPRESSION: Newly diagnosed squamous cell carcinoma of the left lung  PLAN: I spoke  with Miss Plotner today regarding her diagnosis. It sent to a PET scan that was last performed in January and not do been approximately 4 months ago I think she needs to be restaged. I have ordered a PET scan. Dr. Arbutus Ped had ordered an MRI of the brain but that has not been scheduled. He has also ordered a chemotherapy education class which will followup on as well. Per his note he had planned on starting her chemotherapy on April 30. We will have to reschedule that to the first week of May. I discussed with her the role of daily radiation. We discussed possibility of esophagitis. We discussed at this point she likely has stage II or 3 disease. We would know more after hers PET scan. We discussed the possibility of heart and lung damage. We discussed possibility of fatigue and decreased blood counts. I would like to have her PET scan ordered and then simulate her quickly after that. We can use her PET scan for treatment planning purposes as well.  I spent 60 minutes minutes face to face with the patient and more than 50% of that time was spent in counseling and/or coordination of care.   ------------------------------------------------  Lurline Hare, MD

## 2012-02-13 NOTE — Telephone Encounter (Signed)
CALLED PATIENT TO INFORM OF TEST AND SIM, LVM FOR A RETURN CALL 

## 2012-02-14 ENCOUNTER — Telehealth: Payer: Self-pay | Admitting: Internal Medicine

## 2012-02-14 ENCOUNTER — Telehealth: Payer: Self-pay | Admitting: *Deleted

## 2012-02-14 NOTE — Telephone Encounter (Signed)
Per staff message from Gem, I have scheduled patient for treatment. Anne aware appts in computer.  JMW

## 2012-02-14 NOTE — Telephone Encounter (Signed)
CALLED PT WITH 5/7 APPT INFO AND ADVISED HER THAT I WOULD BE ADDING A NUT  APPT,AWARE TO P/U A NEW [facility] ON 5/2    AOM

## 2012-02-17 ENCOUNTER — Other Ambulatory Visit (HOSPITAL_COMMUNITY): Payer: Medicare Other

## 2012-02-18 ENCOUNTER — Other Ambulatory Visit (HOSPITAL_COMMUNITY): Payer: Medicare Other

## 2012-02-18 ENCOUNTER — Ambulatory Visit (HOSPITAL_COMMUNITY)
Admission: RE | Admit: 2012-02-18 | Discharge: 2012-02-18 | Disposition: A | Discharge status: Home / Self Care | Payer: Medicare Other | Source: Ambulatory Visit | Attending: Internal Medicine | Admitting: Internal Medicine

## 2012-02-18 DIAGNOSIS — C3490 Malignant neoplasm of unspecified part of unspecified bronchus or lung: Secondary | ICD-10-CM | POA: Insufficient documentation

## 2012-02-18 MED ORDER — GADOBENATE DIMEGLUMINE 529 MG/ML IV SOLN
10.0000 mL | Freq: Once | INTRAVENOUS | Status: AC
  Administered 2012-02-18: 10 mL via INTRAVENOUS

## 2012-02-20 ENCOUNTER — Ambulatory Visit: Payer: Medicare Other | Admitting: Nutrition

## 2012-02-20 ENCOUNTER — Other Ambulatory Visit: Payer: Self-pay | Admitting: Internal Medicine

## 2012-02-20 ENCOUNTER — Ambulatory Visit
Admission: RE | Admit: 2012-02-20 | Discharge: 2012-02-20 | Disposition: A | Discharge status: Home / Self Care | Payer: Medicare Other | Source: Ambulatory Visit | Attending: Radiation Oncology | Admitting: Radiation Oncology

## 2012-02-20 ENCOUNTER — Encounter (HOSPITAL_COMMUNITY)
Admission: RE | Admit: 2012-02-20 | Discharge: 2012-02-20 | Disposition: A | Discharge status: Home / Self Care | Payer: Medicare Other | Source: Ambulatory Visit | Attending: Radiation Oncology | Admitting: Radiation Oncology

## 2012-02-20 ENCOUNTER — Encounter (HOSPITAL_COMMUNITY): Payer: Self-pay

## 2012-02-20 DIAGNOSIS — M949 Disorder of cartilage, unspecified: Secondary | ICD-10-CM | POA: Insufficient documentation

## 2012-02-20 DIAGNOSIS — R222 Localized swelling, mass and lump, trunk: Secondary | ICD-10-CM | POA: Insufficient documentation

## 2012-02-20 DIAGNOSIS — J449 Chronic obstructive pulmonary disease, unspecified: Secondary | ICD-10-CM | POA: Diagnosis not present

## 2012-02-20 DIAGNOSIS — I7090 Unspecified atherosclerosis: Secondary | ICD-10-CM | POA: Insufficient documentation

## 2012-02-20 DIAGNOSIS — C3490 Malignant neoplasm of unspecified part of unspecified bronchus or lung: Secondary | ICD-10-CM | POA: Diagnosis not present

## 2012-02-20 DIAGNOSIS — M899 Disorder of bone, unspecified: Secondary | ICD-10-CM | POA: Insufficient documentation

## 2012-02-20 DIAGNOSIS — Z510 Encounter for antineoplastic radiation therapy: Secondary | ICD-10-CM | POA: Diagnosis not present

## 2012-02-20 DIAGNOSIS — C3410 Malignant neoplasm of upper lobe, unspecified bronchus or lung: Secondary | ICD-10-CM | POA: Diagnosis not present

## 2012-02-20 DIAGNOSIS — Z79899 Other long term (current) drug therapy: Secondary | ICD-10-CM | POA: Diagnosis not present

## 2012-02-20 LAB — GLUCOSE, CAPILLARY: Glucose-Capillary: 87 mg/dL (ref 70–99)

## 2012-02-20 MED ORDER — FLUDEOXYGLUCOSE F - 18 (FDG) INJECTION
18.9000 | Freq: Once | INTRAVENOUS | Status: AC | PRN
  Administered 2012-02-20: 18.9 via INTRAVENOUS

## 2012-02-20 NOTE — Progress Notes (Signed)
Met with patient to discuss RO billing.  Rad Tx: [id number] Extrl Beam   Attending Rad: Dr. Michell Heinrich   Dx: 162.9 Lung, NOS

## 2012-02-20 NOTE — Assessment & Plan Note (Signed)
Destiny Harrison is a 71 year old female patient of Dr. Arbutus Ped and Dr. Michell Heinrich diagnosed with stage IV lung cancer receiving chemotherapy and radiation treatment.  MEDICAL HISTORY INCLUDES:  Rheumatoid arthritis, COPD and tobacco.  MEDICATIONS INCLUDE:  Calcium, vitamin D, folic acid, methotrexate, Zocor, prednisone.  LABS:  Reviewed.  HEIGHT:  65 inches. WEIGHT:  135.2 pounds. USUAL BODY WEIGHT:  135 pounds. BMI:  22.5.  Patient presents to nutrition consultation.  She states that she has a son on hemodialysis and a husband with prostate cancer.  She has been told that she has an incurable form of lung cancer.  She states she is not afraid or worried.  She has a strong faith and states that God is in charge of what happens to her.  She is interested in information on nutrition and how she can eat to feel better throughout her treatments.  NUTRITION DIAGNOSIS:  Food and nutrition related knowledge deficit related to diagnosis of metastatic lung cancer and associated treatments as evidenced by no prior need for nutrition-related information.  INTERVENTION:  I educated Destiny Harrison to attempt to consume higher calorie, higher protein foods in small amounts throughout the day.  I have reviewed a list of higher protein foods that she should try to incorporate throughout the day.  I have encouraged her to explore nutrition supplements such as Ensure or boost to provide some additional protein.  I have discussed the importance of planning for meals and snacks secondary to her busy life taking care of her other family members.  I provided her with education materials on difficulty and painful swallowing as well as higher calorie, higher protein foods.  I have given her samples and fact sheets to take with her today.  MONITORING/EVALUATION (GOALS):  The patient will tolerate increased oral intake to minimize weight loss and fatigue throughout treatment.  NEXT VISIT:  Thursday, May 9th after  radiation.    ______________________________ Zenovia Jarred, RD, LDN Clinical Nutrition Specialist BN/MEDQ  D:  02/20/2012  T:  02/20/2012  Job:  [id number]

## 2012-02-20 NOTE — Progress Notes (Signed)
[facility] Radiation Oncology Simulation and Treatment Planning Note   Name: Destiny Harrison MRN: [id number]  Date: 02/20/2012  DOB: [birth year]  Status: outpatient    DIAGNOSIS: The encounter diagnosis was Lung cancer.    SIDE: left   CONSENT VERIFIED: yes   SET UP AND IMMOBILIZATION: Patient is setup supine with arms in a wing board.   NARRATIVE: I met with Destiny Harrison today.  Her PET scan performed earlier today unfortunately shows metastatic disease to a hight left paratracheal node and her left rib. After discussion with Dr. Arbutus Ped and radiology, we have elcted to proceed forward with palliative radiation followed by chemotherapy.  The patient was brought to the CT Simulation planning suite.  Identity was confirmed.  All relevant records and images related to the planned course of therapy were reviewed.  Then, the patient was positioned in a stable reproducible clinical set-up for radiation therapy.  CT images were obtained.  Skin markings were placed.  The CT images were loaded into the planning software where the target and avoidance structures were contoured.  The radiation prescription was entered and confirmed.   TREATMENT PLANNING NOTE:  Treatment planning then occurred. I have requested 3D simulation with Destiny Harrison of the spinal cord, total lungs and gross tumor volume. I have also requested mlcs and an isodose plan.   Special treatment procedure will be performed as Destiny Harrison will be receiving concurrent chemotherapy.   I have ordered a consult with the dietician for monitoring.  I will also be verifying that weekly lab values are appropriate.

## 2012-02-21 ENCOUNTER — Telehealth: Payer: Self-pay | Admitting: *Deleted

## 2012-02-21 ENCOUNTER — Telehealth: Payer: Self-pay | Admitting: Internal Medicine

## 2012-02-21 NOTE — Telephone Encounter (Signed)
Per staff message from [place], I have adjusted the appts needed. I also added treatments that need to be added. Anne aware.   JMW

## 2012-02-21 NOTE — Telephone Encounter (Signed)
pt aware that cx current plan and start 5/21,aware  aom,.mw to move tx down

## 2012-02-24 ENCOUNTER — Ambulatory Visit
Admission: RE | Admit: 2012-02-24 | Discharge: 2012-02-24 | Disposition: A | Discharge status: Home / Self Care | Payer: Medicare Other | Source: Ambulatory Visit | Attending: Radiation Oncology | Admitting: Radiation Oncology

## 2012-02-24 DIAGNOSIS — C3490 Malignant neoplasm of unspecified part of unspecified bronchus or lung: Secondary | ICD-10-CM

## 2012-02-24 DIAGNOSIS — C3410 Malignant neoplasm of upper lobe, unspecified bronchus or lung: Secondary | ICD-10-CM | POA: Diagnosis not present

## 2012-02-24 DIAGNOSIS — J449 Chronic obstructive pulmonary disease, unspecified: Secondary | ICD-10-CM | POA: Diagnosis not present

## 2012-02-24 DIAGNOSIS — Z510 Encounter for antineoplastic radiation therapy: Secondary | ICD-10-CM | POA: Diagnosis not present

## 2012-02-24 DIAGNOSIS — Z79899 Other long term (current) drug therapy: Secondary | ICD-10-CM | POA: Diagnosis not present

## 2012-02-25 ENCOUNTER — Ambulatory Visit
Admission: RE | Admit: 2012-02-25 | Discharge: 2012-02-25 | Disposition: A | Discharge status: Home / Self Care | Payer: Medicare Other | Source: Ambulatory Visit | Attending: Radiation Oncology | Admitting: Radiation Oncology

## 2012-02-25 ENCOUNTER — Ambulatory Visit: Payer: Medicare Other

## 2012-02-25 ENCOUNTER — Other Ambulatory Visit: Payer: Medicare Other | Admitting: Lab

## 2012-02-25 DIAGNOSIS — J449 Chronic obstructive pulmonary disease, unspecified: Secondary | ICD-10-CM | POA: Diagnosis not present

## 2012-02-25 DIAGNOSIS — C3490 Malignant neoplasm of unspecified part of unspecified bronchus or lung: Secondary | ICD-10-CM | POA: Diagnosis not present

## 2012-02-25 DIAGNOSIS — Z510 Encounter for antineoplastic radiation therapy: Secondary | ICD-10-CM | POA: Diagnosis not present

## 2012-02-25 DIAGNOSIS — Z79899 Other long term (current) drug therapy: Secondary | ICD-10-CM | POA: Diagnosis not present

## 2012-02-25 NOTE — Progress Notes (Signed)
  Radiation Oncology         [phone]) [phone] ________________________________  Name: Destiny Harrison MRN: [id number]  Date: 02/24/2012  DOB: 09-05-1941  Simulation Verification Note  Status: outpatient  NARRATIVE: The patient was brought to the treatment unit and placed in the planned treatment position for sim verification for treatment of her lung cancer. The clinical setup was verified. Then port films were obtained and uploaded to the radiation oncology medical record software.  The treatment beams were carefully compared against the planned radiation fields. The position location and shape of the radiation fields was reviewed. They targeted volume of tissue appears to be appropriately covered by the radiation beams. Organs at risk appear to be excluded as planned.  Based on my personal review, I approved the simulation verification. The patient's treatment will proceed as planned.  ------------------------------------------------  Lurline Hare, MD

## 2012-02-26 ENCOUNTER — Ambulatory Visit
Admission: RE | Admit: 2012-02-26 | Discharge: 2012-02-26 | Disposition: A | Discharge status: Home / Self Care | Payer: Medicare Other | Source: Ambulatory Visit | Attending: Radiation Oncology | Admitting: Radiation Oncology

## 2012-02-26 ENCOUNTER — Encounter: Payer: Self-pay | Admitting: Radiation Oncology

## 2012-02-26 VITALS — BP 153/112 | HR 103 | Resp 18 | Wt 135.9 lb

## 2012-02-26 DIAGNOSIS — C3490 Malignant neoplasm of unspecified part of unspecified bronchus or lung: Secondary | ICD-10-CM

## 2012-02-26 DIAGNOSIS — Z510 Encounter for antineoplastic radiation therapy: Secondary | ICD-10-CM | POA: Diagnosis not present

## 2012-02-26 DIAGNOSIS — Z79899 Other long term (current) drug therapy: Secondary | ICD-10-CM | POA: Diagnosis not present

## 2012-02-26 DIAGNOSIS — J449 Chronic obstructive pulmonary disease, unspecified: Secondary | ICD-10-CM | POA: Diagnosis not present

## 2012-02-26 NOTE — Progress Notes (Signed)
Weekly Management Note Current Dose: 9 Gy  Projected Dose:30 Gy   Narrative:  The patient presents for routine under treatment assessment.  CBCT/MVCT images/Port film x-rays were reviewed.  The chart was checked. Upset due to trial outcome of son's murder today. No other symptoms. Meeting with Vernell Leep tomorrow.  Physical Findings:  Unchanged  Vitals:  Filed Vitals:   02/26/12 1626  BP: 153/112  Pulse: 103  Resp: 18   Weight:  Wt Readings from Last 3 Encounters:  02/26/12 135 lb 14.4 oz (61.644 kg)  02/12/12 135 lb 3.2 oz (61.326 kg)  02/06/12 125 lb (56.7 kg)   Lab Results  Component Value Date   WBC 5.3 02/13/2012   HGB 11.8 02/13/2012   HCT 35.0 02/13/2012   MCV 79.0* 02/13/2012   PLT 360 02/13/2012   Lab Results  Component Value Date   CREATININE 0.68 02/13/2012   BUN 12 02/13/2012   NA 138 02/13/2012   K 4.3 02/13/2012   CL 102 02/13/2012   CO2 29 02/13/2012     Impression:  The patient is tolerating radiation.  Plan:  Continue treatment as planned. Comfort provided.

## 2012-02-26 NOTE — Progress Notes (Signed)
Patient presents to clinic today unaccompanied for under treat visit with Dr. Michell Heinrich. Patient is alert and oriented to person, place, and time. NO distress noted. Steady gait noted. Pleasant affect noted. Patient reports constant aching right shoulder pain related to effects of disease. Patient reports a continued productive cough with clear sputum. Patient's blood pressure elevated related to sons murder trial. Reported all findings to Dr. Michell Heinrich.

## 2012-02-27 ENCOUNTER — Ambulatory Visit: Payer: Medicare Other | Admitting: Nutrition

## 2012-02-27 ENCOUNTER — Ambulatory Visit
Admission: RE | Admit: 2012-02-27 | Discharge: 2012-02-27 | Disposition: A | Discharge status: Home / Self Care | Payer: Medicare Other | Source: Ambulatory Visit | Attending: Radiation Oncology | Admitting: Radiation Oncology

## 2012-02-27 DIAGNOSIS — C3490 Malignant neoplasm of unspecified part of unspecified bronchus or lung: Secondary | ICD-10-CM | POA: Diagnosis not present

## 2012-02-27 DIAGNOSIS — J449 Chronic obstructive pulmonary disease, unspecified: Secondary | ICD-10-CM | POA: Diagnosis not present

## 2012-02-27 DIAGNOSIS — Z79899 Other long term (current) drug therapy: Secondary | ICD-10-CM | POA: Diagnosis not present

## 2012-02-27 DIAGNOSIS — Z510 Encounter for antineoplastic radiation therapy: Secondary | ICD-10-CM | POA: Diagnosis not present

## 2012-02-28 ENCOUNTER — Ambulatory Visit
Admission: RE | Admit: 2012-02-28 | Discharge: 2012-02-28 | Disposition: A | Discharge status: Home / Self Care | Payer: Medicare Other | Source: Ambulatory Visit | Attending: Radiation Oncology | Admitting: Radiation Oncology

## 2012-02-28 DIAGNOSIS — C3490 Malignant neoplasm of unspecified part of unspecified bronchus or lung: Secondary | ICD-10-CM | POA: Diagnosis not present

## 2012-02-28 DIAGNOSIS — Z510 Encounter for antineoplastic radiation therapy: Secondary | ICD-10-CM | POA: Diagnosis not present

## 2012-02-28 DIAGNOSIS — J449 Chronic obstructive pulmonary disease, unspecified: Secondary | ICD-10-CM | POA: Diagnosis not present

## 2012-02-28 DIAGNOSIS — Z79899 Other long term (current) drug therapy: Secondary | ICD-10-CM | POA: Diagnosis not present

## 2012-02-28 NOTE — Progress Notes (Signed)
Destiny Harrison presents to nutrition followup with her son.  She reports she is doing pretty well.  She has been able to maintain her weight, which was documented as 135.9 pounds on the 8th.  She is pretty much forcing herself to eat and continues to drink Ensure Plus or Boost Plus.  She reports her family is supportive.  She really has no questions.  NUTRITION DIAGNOSIS:  Food and nutrition related knowledge deficit continues, but has improved.  INTERVENTION:  I have encouraged Destiny Harrison to continue either Ensure Plus or Boost Plus throughout the day and I have provided her with coupons, and a case of complimentary Ensure Plus her to take with her today.  I have educated her on other oral nutrition supplements that will be equally effective and maybe a little cheaper for her to purchase.  I have encouraged her to continue to consume small soft moist meals.  MONITORING/EVALUATION (GOALS):  The patient has tolerated oral intake to minimize weight loss.  She will continue to work for increased oral intake and weight maintenance.  NEXT VISIT:  Tuesday, May 21st, during chemotherapy.    ______________________________ Zenovia Jarred, RD, LDN Clinical Nutrition Specialist BN/MEDQ  D:  02/28/2012  T:  02/28/2012  Job:  [id number]

## 2012-03-02 ENCOUNTER — Ambulatory Visit
Admission: RE | Admit: 2012-03-02 | Discharge: 2012-03-02 | Disposition: A | Discharge status: Home / Self Care | Payer: Medicare Other | Source: Ambulatory Visit | Attending: Radiation Oncology | Admitting: Radiation Oncology

## 2012-03-02 DIAGNOSIS — C3490 Malignant neoplasm of unspecified part of unspecified bronchus or lung: Secondary | ICD-10-CM | POA: Diagnosis not present

## 2012-03-02 DIAGNOSIS — Z510 Encounter for antineoplastic radiation therapy: Secondary | ICD-10-CM | POA: Diagnosis not present

## 2012-03-02 DIAGNOSIS — C3410 Malignant neoplasm of upper lobe, unspecified bronchus or lung: Secondary | ICD-10-CM | POA: Diagnosis not present

## 2012-03-02 DIAGNOSIS — Z79899 Other long term (current) drug therapy: Secondary | ICD-10-CM | POA: Diagnosis not present

## 2012-03-02 DIAGNOSIS — J449 Chronic obstructive pulmonary disease, unspecified: Secondary | ICD-10-CM | POA: Diagnosis not present

## 2012-03-03 ENCOUNTER — Ambulatory Visit
Admission: RE | Admit: 2012-03-03 | Discharge: 2012-03-03 | Disposition: A | Discharge status: Home / Self Care | Payer: Medicare Other | Source: Ambulatory Visit | Attending: Radiation Oncology | Admitting: Radiation Oncology

## 2012-03-03 ENCOUNTER — Encounter: Payer: Self-pay | Admitting: Radiation Oncology

## 2012-03-03 ENCOUNTER — Other Ambulatory Visit: Payer: Medicare Other | Admitting: Lab

## 2012-03-03 ENCOUNTER — Ambulatory Visit: Payer: Medicare Other | Admitting: Physician Assistant

## 2012-03-03 ENCOUNTER — Ambulatory Visit: Payer: Medicare Other

## 2012-03-03 VITALS — BP 138/70 | HR 76 | Temp 97.5°F | Resp 20 | Wt 136.5 lb

## 2012-03-03 DIAGNOSIS — J449 Chronic obstructive pulmonary disease, unspecified: Secondary | ICD-10-CM | POA: Diagnosis not present

## 2012-03-03 DIAGNOSIS — C3490 Malignant neoplasm of unspecified part of unspecified bronchus or lung: Secondary | ICD-10-CM | POA: Diagnosis not present

## 2012-03-03 DIAGNOSIS — Z510 Encounter for antineoplastic radiation therapy: Secondary | ICD-10-CM | POA: Diagnosis not present

## 2012-03-03 DIAGNOSIS — Z79899 Other long term (current) drug therapy: Secondary | ICD-10-CM | POA: Diagnosis not present

## 2012-03-03 MED ORDER — HYDROCODONE-ACETAMINOPHEN 7.5-500 MG/15ML PO SOLN: 15.0000 mL | Freq: Four times a day (QID) | ORAL | Status: DC | PRN

## 2012-03-03 MED ORDER — SUCRALFATE 1 G PO TABS: 1.0000 g | ORAL_TABLET | Freq: Four times a day (QID) | ORAL | Status: DC

## 2012-03-03 NOTE — Progress Notes (Signed)
Patient alert,oriented, sometimes difficulty swallowing, eats grits,oatmeal, fruit, drinks ensure plus alternating with boost plus daily,drinks lots water, down to 2 cups coffee now, has hacking dry cough, sometinmes coughs up clear sputum, missed lab todat,patient was unaware of this, was ordered by her primary MD dr. Ronne Binning, Ramon Dredge, pain in right shoulder,not taking any pain meds at this time, completed 7/10 txs 4:24 PM

## 2012-03-03 NOTE — Progress Notes (Signed)
Weekly Management Note Current Dose:21 Gy  Projected Dose:30 Gy   Narrative:  The patient presents for routine under treatment assessment.  CBCT/MVCT images/Port film x-rays were reviewed.  The chart was checked. Some dysphagia. Has vicodin but doesn't like to take. Taking boost and ensure.   Physical Findings:  Unchanged. Alert and oriented.   Vitals:  Filed Vitals:   03/03/12 1620  BP: 138/70  Pulse: 76  Temp: 97.5 F (36.4 C)  Resp: 20   Weight:  Wt Readings from Last 3 Encounters:  03/03/12 136 lb 8 oz (61.916 kg)  02/26/12 135 lb 14.4 oz (61.644 kg)  02/12/12 135 lb 3.2 oz (61.326 kg)   Lab Results  Component Value Date   WBC 5.3 02/13/2012   HGB 11.8 02/13/2012   HCT 35.0 02/13/2012   MCV 79.0* 02/13/2012   PLT 360 02/13/2012   Lab Results  Component Value Date   CREATININE 0.68 02/13/2012   BUN 12 02/13/2012   NA 138 02/13/2012   K 4.3 02/13/2012   CL 102 02/13/2012   CO2 29 02/13/2012     Impression:  The patient is tolerating radiation.  Plan:  Continue treatment as planned. Continue supplements. Gave scripts for carafate and lortab prn. Call with questions. F/u in 1 month. Chemo starts weds.

## 2012-03-04 ENCOUNTER — Ambulatory Visit
Admission: RE | Admit: 2012-03-04 | Discharge: 2012-03-04 | Disposition: A | Discharge status: Home / Self Care | Payer: Medicare Other | Source: Ambulatory Visit | Attending: Radiation Oncology | Admitting: Radiation Oncology

## 2012-03-04 DIAGNOSIS — Z79899 Other long term (current) drug therapy: Secondary | ICD-10-CM | POA: Diagnosis not present

## 2012-03-04 DIAGNOSIS — C3490 Malignant neoplasm of unspecified part of unspecified bronchus or lung: Secondary | ICD-10-CM | POA: Diagnosis not present

## 2012-03-04 DIAGNOSIS — J449 Chronic obstructive pulmonary disease, unspecified: Secondary | ICD-10-CM | POA: Diagnosis not present

## 2012-03-04 DIAGNOSIS — Z510 Encounter for antineoplastic radiation therapy: Secondary | ICD-10-CM | POA: Diagnosis not present

## 2012-03-05 ENCOUNTER — Ambulatory Visit
Admission: RE | Admit: 2012-03-05 | Discharge: 2012-03-05 | Disposition: A | Discharge status: Home / Self Care | Payer: Medicare Other | Source: Ambulatory Visit | Attending: Radiation Oncology | Admitting: Radiation Oncology

## 2012-03-05 DIAGNOSIS — Z79899 Other long term (current) drug therapy: Secondary | ICD-10-CM | POA: Diagnosis not present

## 2012-03-05 DIAGNOSIS — J449 Chronic obstructive pulmonary disease, unspecified: Secondary | ICD-10-CM | POA: Diagnosis not present

## 2012-03-05 DIAGNOSIS — C3490 Malignant neoplasm of unspecified part of unspecified bronchus or lung: Secondary | ICD-10-CM | POA: Diagnosis not present

## 2012-03-05 DIAGNOSIS — Z510 Encounter for antineoplastic radiation therapy: Secondary | ICD-10-CM | POA: Diagnosis not present

## 2012-03-06 ENCOUNTER — Encounter: Payer: Self-pay | Admitting: Radiation Oncology

## 2012-03-06 ENCOUNTER — Ambulatory Visit
Admission: RE | Admit: 2012-03-06 | Discharge: 2012-03-06 | Disposition: A | Discharge status: Home / Self Care | Payer: Medicare Other | Source: Ambulatory Visit | Attending: Radiation Oncology | Admitting: Radiation Oncology

## 2012-03-06 DIAGNOSIS — C3490 Malignant neoplasm of unspecified part of unspecified bronchus or lung: Secondary | ICD-10-CM | POA: Diagnosis not present

## 2012-03-06 DIAGNOSIS — J449 Chronic obstructive pulmonary disease, unspecified: Secondary | ICD-10-CM | POA: Diagnosis not present

## 2012-03-06 DIAGNOSIS — Z79899 Other long term (current) drug therapy: Secondary | ICD-10-CM | POA: Diagnosis not present

## 2012-03-06 DIAGNOSIS — Z923 Personal history of irradiation: Secondary | ICD-10-CM

## 2012-03-06 DIAGNOSIS — Z510 Encounter for antineoplastic radiation therapy: Secondary | ICD-10-CM | POA: Diagnosis not present

## 2012-03-06 HISTORY — DX: Personal history of irradiation: Z92.3

## 2012-03-09 ENCOUNTER — Ambulatory Visit: Payer: Medicare Other

## 2012-03-09 NOTE — Progress Notes (Signed)
  Radiation Oncology         [phone] ________________________________  Name: Destiny Harrison MRN: [id number]  Date: 03/06/2012  DOB: 1941-01-31  End of Treatment Note  Diagnosis:   The encounter diagnosis was Lung cancer  Indication for treatment:  Palliative       Radiation treatment dates:  02/24/2012-03/06/2012  Site/dose:   L hilum  / 30 Gray @ 3 Wallace Cullens per fraction  X 10 fractions  Beams/energy:   3 field / 15 MV photons  Narrative: The patient tolerated radiation treatment relatively well.   She really had very little in terms of dysphagia symptoms.  Plan: The patient has completed radiation treatment. The patient will return to radiation oncology clinic for routine followup in one month. I advised them to call or return sooner if they have any questions or concerns related to their recovery or treatment.  ------------------------------------------------  Lurline Hare, MD

## 2012-03-10 ENCOUNTER — Ambulatory Visit (HOSPITAL_BASED_OUTPATIENT_CLINIC_OR_DEPARTMENT_OTHER): Payer: Medicare Other | Admitting: Internal Medicine

## 2012-03-10 ENCOUNTER — Ambulatory Visit: Payer: Medicare Other | Admitting: Nutrition

## 2012-03-10 ENCOUNTER — Other Ambulatory Visit (HOSPITAL_BASED_OUTPATIENT_CLINIC_OR_DEPARTMENT_OTHER): Payer: Medicare Other | Admitting: Lab

## 2012-03-10 ENCOUNTER — Telehealth: Payer: Self-pay | Admitting: Medical Oncology

## 2012-03-10 ENCOUNTER — Encounter: Payer: Self-pay | Admitting: *Deleted

## 2012-03-10 ENCOUNTER — Ambulatory Visit: Payer: Medicare Other

## 2012-03-10 ENCOUNTER — Other Ambulatory Visit: Payer: Self-pay | Admitting: Medical Oncology

## 2012-03-10 ENCOUNTER — Ambulatory Visit (HOSPITAL_BASED_OUTPATIENT_CLINIC_OR_DEPARTMENT_OTHER): Payer: Medicare Other

## 2012-03-10 VITALS — BP 141/81 | HR 89 | Temp 97.3°F

## 2012-03-10 VITALS — BP 145/78 | HR 89 | Temp 98.8°F | Ht 65.0 in | Wt 136.3 lb

## 2012-03-10 DIAGNOSIS — Z95828 Presence of other vascular implants and grafts: Secondary | ICD-10-CM

## 2012-03-10 DIAGNOSIS — Z5111 Encounter for antineoplastic chemotherapy: Secondary | ICD-10-CM

## 2012-03-10 DIAGNOSIS — C3490 Malignant neoplasm of unspecified part of unspecified bronchus or lung: Secondary | ICD-10-CM | POA: Diagnosis not present

## 2012-03-10 DIAGNOSIS — C3410 Malignant neoplasm of upper lobe, unspecified bronchus or lung: Secondary | ICD-10-CM

## 2012-03-10 DIAGNOSIS — Z7969 Long term (current) use of other immunomodulators and immunosuppressants: Secondary | ICD-10-CM

## 2012-03-10 DIAGNOSIS — Z79899 Other long term (current) drug therapy: Secondary | ICD-10-CM

## 2012-03-10 DIAGNOSIS — I878 Other specified disorders of veins: Secondary | ICD-10-CM

## 2012-03-10 LAB — CBC WITH DIFFERENTIAL/PLATELET
BASO%: 0.2 % (ref 0.0–2.0)
EOS%: 3.4 % (ref 0.0–7.0)
HCT: 33.9 % — ABNORMAL LOW (ref 34.8–46.6)
LYMPH%: 29.7 % (ref 14.0–49.7)
MCH: 26.9 pg (ref 25.1–34.0)
MCHC: 33.9 g/dL (ref 31.5–36.0)
NEUT%: 41.9 % (ref 38.4–76.8)
Platelets: 221 10*3/uL (ref 145–400)
RBC: 4.27 10*6/uL (ref 3.70–5.45)

## 2012-03-10 LAB — COMPREHENSIVE METABOLIC PANEL
ALT: 11 U/L (ref 0–35)
AST: 19 U/L (ref 0–37)
CO2: 27 mEq/L (ref 19–32)
Calcium: 8.9 mg/dL (ref 8.4–10.5)
Chloride: 103 mEq/L (ref 96–112)
Creatinine, Ser: 0.63 mg/dL (ref 0.50–1.10)
Sodium: 138 mEq/L (ref 135–145)
Total Bilirubin: 0.4 mg/dL (ref 0.3–1.2)
Total Protein: 7.5 g/dL (ref 6.0–8.3)

## 2012-03-10 MED ORDER — METHYLPREDNISOLONE SODIUM SUCC 125 MG IJ SOLR
125.0000 mg | Freq: Once | INTRAMUSCULAR | Status: AC | PRN
  Administered 2012-03-10: 125 mg via INTRAVENOUS

## 2012-03-10 MED ORDER — SODIUM CHLORIDE 0.9 % IV SOLN
622.2000 mg | Freq: Once | INTRAVENOUS | Status: AC
  Administered 2012-03-10: 620 mg via INTRAVENOUS
  Filled 2012-03-10: qty 62

## 2012-03-10 MED ORDER — DIPHENHYDRAMINE HCL 50 MG/ML IJ SOLN
25.0000 mg | Freq: Once | INTRAMUSCULAR | Status: AC | PRN
  Administered 2012-03-10: 25 mg via INTRAVENOUS

## 2012-03-10 MED ORDER — DEXAMETHASONE SODIUM PHOSPHATE 4 MG/ML IJ SOLN
20.0000 mg | Freq: Once | INTRAMUSCULAR | Status: AC
  Administered 2012-03-10: 20 mg via INTRAVENOUS

## 2012-03-10 MED ORDER — SODIUM CHLORIDE 0.9 % IV SOLN
Freq: Once | INTRAVENOUS | Status: AC
  Administered 2012-03-10: 10:00:00 via INTRAVENOUS

## 2012-03-10 MED ORDER — PACLITAXEL PROTEIN-BOUND CHEMO INJECTION 100 MG
100.0000 mg/m2 | Freq: Once | INTRAVENOUS | Status: AC
  Administered 2012-03-10: 175 mg via INTRAVENOUS
  Filled 2012-03-10: qty 35

## 2012-03-10 MED ORDER — DIPHENHYDRAMINE HCL 50 MG/ML IJ SOLN
50.0000 mg | Freq: Once | INTRAMUSCULAR | Status: AC
  Administered 2012-03-10: 50 mg via INTRAVENOUS

## 2012-03-10 MED ORDER — FAMOTIDINE IN NACL 20-0.9 MG/50ML-% IV SOLN
20.0000 mg | Freq: Once | INTRAVENOUS | Status: AC
  Administered 2012-03-10: 20 mg via INTRAVENOUS

## 2012-03-10 MED ORDER — ONDANSETRON 16 MG/50ML IVPB (CHCC): 16.0000 mg | Freq: Once | INTRAVENOUS | Status: DC

## 2012-03-10 MED ORDER — LIDOCAINE-PRILOCAINE 2.5-2.5 % EX CREA: TOPICAL_CREAM | CUTANEOUS | Status: DC | PRN

## 2012-03-10 MED ORDER — SODIUM CHLORIDE 0.9 % IV SOLN: Freq: Once | INTRAVENOUS | Status: DC

## 2012-03-10 MED ORDER — PACLITAXEL CHEMO INJECTION 300 MG/50ML
100.0000 mg/m2 | Freq: Once | INTRAVENOUS | Status: DC
  Administered 2012-03-10: 168 mg via INTRAVENOUS
  Filled 2012-03-10: qty 28

## 2012-03-10 MED ORDER — ONDANSETRON 16 MG/50ML IVPB (CHCC)
16.0000 mg | Freq: Once | INTRAVENOUS | Status: AC
  Administered 2012-03-10: 16 mg via INTRAVENOUS

## 2012-03-10 MED ORDER — DEXAMETHASONE SODIUM PHOSPHATE 4 MG/ML IJ SOLN: 20.0000 mg | Freq: Once | INTRAMUSCULAR | Status: DC

## 2012-03-10 NOTE — Progress Notes (Signed)
Spoke with pt at [facility] today.  Questions and concerns answered.  I contacted Revonda Standard at [facility] office for port a cath appt

## 2012-03-10 NOTE — Telephone Encounter (Signed)
Faxed emla cream rx to pharmacy

## 2012-03-10 NOTE — Progress Notes (Signed)
1st time 3H Taxol started at 1104 at 21cc/hr x 5cc.  At 1119, it was time to increase rate of Taxol, pt started c/o tingling in her hands and a new dry, uncomfortable feeling in her throat.  Taxol stopped at 11:20am, 25 benadryl given, at 11:25, 125mg  solunmedrol, NS wide open.  Symptoms resolved by 11:40am.  Dr Donnald Garre aware, instructed to not restart Taxol.  Pt is to get abraxane and carboplatin.  Dr Donnald Garre came and spoke to pt about drug change.  Pt verbalized understanding.  SLJ

## 2012-03-10 NOTE — Progress Notes (Signed)
[facility] [phone]   [phone]  OFFICE PROGRESS NOTE  [name], MD, MD [address] 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  PRIOR THERAPY: Status post palliative radiotherapy to the left lung mass under the care of Dr. Michell Heinrich completed on 03/06/2012.  CURRENT THERAPY: Systemic chemotherapy with carboplatin for AUC of 6 on day 1 and Abraxane 100 mg/M2 on days 1, 8 and 15 every 3 weeks. The patient is here today to start cycle #1.  INTERVAL HISTORY: Destiny Harrison 71 y.o. female returns to the clinic today for followup visit accompanied by her son. The patient had a PET scan performed on 02/20/2012 and it showed Left upper lobe endobronchial mass with hypermetabolic adenopathy extending to the contralateral mediastinum and deep to the right clavicular head, all consistent with primary bronchogenic carcinoma. Left eleventh rib lesion is highly worrisome for a metastatic lesion. She underwent a palliative course of radiotherapy to the left lung mass under the care of Dr. Michell Heinrich. She is here today for evaluation and starting her systemic chemotherapy. The patient is feeling fine with no specific complaints sent for mild pain on the right shoulder area. She denied having any significant shortness of breath. She continues to have mild cough. No significant weight loss or night sweats.  MEDICAL HISTORY: Past Medical History  Diagnosis Date  . COPD (chronic obstructive pulmonary disease)   . Cancer     ALLERGIES:   has no known allergies.  MEDICATIONS:  Current Outpatient Prescriptions  Medication Sig Dispense Refill  . albuterol (PROVENTIL HFA;VENTOLIN HFA) 108 (90 BASE) MCG/ACT inhaler Inhale 2 puffs into the lungs every 6 (six) hours as needed.        . calcium carbonate 200 MG capsule Take 250 mg by mouth daily.        . Cholecalciferol (VITAMIN  D PO) Take by mouth. 5000 units qd        . folic acid (FOLVITE) 1 MG tablet Take 1 mg by mouth daily.        Marland Kitchen HYDROcodone-acetaminophen (LORTAB) 7.5-500 MG/15ML solution Take 15 mLs by mouth every 6 (six) hours as needed for pain.  120 mL  0  . HYDROcodone-acetaminophen (VICODIN) 5-500 MG per tablet Take 1 tablet by mouth every 6 (six) hours as needed.        . methotrexate 25 MG/ML SOLN 25 mg once. 1ml once weekly      . predniSONE (DELTASONE) 5 MG tablet Take 5 mg by mouth daily.        . Simvastatin (ZOCOR PO) Take 5 mg by mouth once.        . sucralfate (CARAFATE) 1 G tablet Take 1 tablet (1 g total) by mouth 4 (four) times daily. Dissolve 1 tablet in water prior to ingesting 4 times per day.  60 tablet  1  . tiotropium (SPIRIVA HANDIHALER) 18 MCG inhalation capsule Place 1 capsule (18 mcg total) into inhaler and inhale daily.  30 capsule  2    SURGICAL HISTORY:  Past Surgical History  Procedure Date  . Appendex 1962  . Video bronchoscopy 01/28/2012    Procedure: VIDEO BRONCHOSCOPY WITHOUT FLUORO;  Surgeon: Kalman Shan, MD;  Location: [facility] ENDOSCOPY;  Service: Endoscopy;  Laterality: Bilateral;  . Surgery on right wrist     REVIEW OF SYSTEMS:  A comprehensive review of systems was negative except for: Constitutional: positive  for fatigue Respiratory: positive for cough   PHYSICAL EXAMINATION: General appearance: alert, cooperative and no distress Head: Normocephalic, without obvious abnormality, atraumatic Neck: no adenopathy Lymph nodes: Cervical, supraclavicular, and axillary nodes normal. Resp: clear to auscultation bilaterally Cardio: regular rate and rhythm, S1, S2 normal, no murmur, click, rub or gallop GI: soft, non-tender; bowel sounds normal; no masses,  no organomegaly Extremities: extremities normal, atraumatic, no cyanosis or edema Neurologic: Alert and oriented X 3, normal strength and tone. Normal symmetric reflexes. Normal coordination and gait  ECOG  PERFORMANCE STATUS: 1 - Symptomatic but completely ambulatory  Blood pressure 145/78, pulse 89, temperature 98.8 F (37.1 C), temperature source Oral, height 5\' 5"  (1.651 m), weight 136 lb 4.8 oz (61.825 kg).  LABORATORY DATA: Lab Results  Component Value Date   WBC 4.1 03/10/2012   HGB 11.5* 03/10/2012   HCT 33.9* 03/10/2012   MCV 79.4* 03/10/2012   PLT 221 03/10/2012      Chemistry      Component Value Date/Time   NA 138 02/13/2012 0920   K 4.3 02/13/2012 0920   CL 102 02/13/2012 0920   CO2 29 02/13/2012 0920   BUN 12 02/13/2012 0920   CREATININE 0.68 02/13/2012 0920      Component Value Date/Time   CALCIUM 8.8 02/13/2012 0920   ALKPHOS 77 02/13/2012 0920   AST 16 02/13/2012 0920   ALT 8 02/13/2012 0920   BILITOT 0.5 02/13/2012 0920       RADIOGRAPHIC STUDIES: Mr [name] WR Contrast  2012-03-17  *RADIOLOGY REPORT*  Clinical Data: The lung cancer.  MRI HEAD WITHOUT AND WITH CONTRAST  Technique:  Multiplanar, multiecho pulse sequences of the brain and surrounding structures were obtained according to standard protocol without and with intravenous contrast  Contrast: 10mL MULTIHANCE GADOBENATE DIMEGLUMINE 529 MG/ML IV SOLN  Comparison: None available.  Findings: No acute infarct, hemorrhage, or mass lesion is present. The postcontrast images demonstrate no enhancement to suggest metastatic disease of the brain and meninges.  There is a focal 6 mm area of enhancement within the right parietal calvarium.  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  There is no other evidence for metastatic disease of the calvarium.  Degenerative changes are noted in the upper cervical spine.  Flow is present in the major intracranial arteries.  The globes and orbits are intact.  The paranasal sinuses and mastoid air cells are clear.  The ventricles are of normal size.  No significant extra-axial fluid collection is present.  IMPRESSION:  1.  No evidence for metastatic disease  of the brain or meninges. 2.  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  Metastatic disease is considered less likely in the absence of other bony metastases.  Original Report Authenticated By: Jamesetta Orleans. MATTERN, M.D.   Nm Pet Image Restag (ps) Skull Base To Thigh  02/20/2012  *RADIOLOGY REPORT*  Clinical Data: Subsequent treatment strategy for lung cancer.  NUCLEAR MEDICINE PET SKULL BASE TO THIGH  Fasting Blood Glucose:  87  Technique:  18.9 mCi F-18 FDG was injected intravenously. CT data was obtained and used for attenuation correction and anatomic localization only.  (This was not acquired as a diagnostic CT examination.) Additional exam technical data entered on technologist worksheet.  Comparison:  CT chest 01/14/2012 and PET CT 11/07/2011.  Findings:  Neck: No hypermetabolic lymph nodes in the neck. CT images show no  acute findings.  Chest:  A 5 mm lymph node deep to the right clavicular head has an S U V max of 3.8.  A right paratracheal lymph node measures 9 mm and has an S U V max of 12.8.  There is AP window and subcarinal adenopathy with an S U V max of 14.7 in the AP window (9 mm).  Endobronchial lesion in the left upper lobe has an S U V max of 16.7.  No additional areas of abnormal hypermetabolism in the chest.  CT images show no acute findings.  There is extensive atherosclerotic calcification of the arterial vasculature.  No pericardial or pleural effusion.  Mild postobstructive changes in the left upper lobe, as before.  Abdomen/Pelvis:  No abnormal hypermetabolic activity within the liver, pancreas, adrenal glands, or spleen.  No hypermetabolic lymph nodes in the abdomen or pelvis. CT images show no acute findings.  No free fluid.  [place]:  There is a focal area of hypermetabolism associated with the lateral left eleventh rib, with a small amount of associated soft tissue and cortical erosion (CT image 120). Findings appear new when  compared 11/07/2011. 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  IMPRESSION:  Left upper lobe endobronchial mass with hypermetabolic adenopathy extending to the contralateral mediastinum and deep to the right clavicular head, all consistent with primary bronchogenic carcinoma.  Left eleventh rib lesion is highly worrisome for a metastatic lesion.  Original Report Authenticated By: Reyes Ivan, M.D.    ASSESSMENT: This is a very pleasant 71 years old African American female with metastatic non-small cell lung cancer, squamous cell carcinoma status post short course of palliative radiotherapy and she is here today to start the first cycle of systemic chemotherapy.  PLAN: The patient is doing fine we'll start the first cycle of systemic chemotherapy with carboplatin for AUC of 6 on day 1 and Abraxane 100 mg/M2 on days 1, 8 and 15 every 3 weeks. I discussed with the patient adverse effect of this treatment including but not limited to alopecia, myelosuppression, peripheral neuropathy, nausea and vomiting, liver or renal dysfunction. And the patient would like to proceed with treatment as planned. She already had chemotherapy education class. She also had prescription for Compazine 10 mg by mouth every 6 hours as needed for nausea. The patient would come back for followup visit in one week for evaluation and management any adverse effect of her chemotherapy. She was advised to call me immediately if she has any concerning symptoms in the interval.  All questions were answered. The patient knows to call the clinic with any problems, questions or concerns. We can certainly see the patient much sooner if necessary.  I spent 20 minutes counseling the patient face to face. The total time spent in the appointment was 30 minutes.

## 2012-03-10 NOTE — Progress Notes (Signed)
I spoke with the patient in the chemotherapy area.  She reports that she is continuing to tolerate some food and Boost Plus.  She drinks Boost Plus q.i.d.  Her weight is stable at 136.3 pounds.  She does experience some soreness in her esophagus, which is making it difficult for her to eat.  NUTRITION DIAGNOSIS:  Food and nutrition-related knowledge deficit has improved.  INTERVENTION:  I have encouraged the patient to continue Boost Plus q.i.d.  I provided her with coupons to take with her today and encouraged her to increase soft moist foods as tolerated.  MONITORING/EVALUATION/GOALS:  The patient is tolerating oral intake to maintain her weight.  She will continue to work to consume adequate calories and protein for weight maintenance.  NEXT VISIT:  Tuesday, June 18th during chemotherapy.    ______________________________ Zenovia Jarred, RD, LDN Clinical Nutrition Specialist BN/MEDQ  D:  03/10/2012  T:  03/10/2012  Job:  [id number]

## 2012-03-10 NOTE — Telephone Encounter (Signed)
Pt notified of pending appointment for port placement

## 2012-03-10 NOTE — Patient Instructions (Signed)
[facility] Discharge Instructions for Patients Receiving Chemotherapy  Today you received the following chemotherapy agents abraxane, carboplatin  To help prevent nausea and vomiting after your treatment, we encourage you to take your nausea medication as prescribed by doctor   If you develop nausea and vomiting that is not controlled by your nausea medication, call the clinic. If it is after clinic hours your family physician or the after hours number for the clinic or go to the Emergency Department.   BELOW ARE SYMPTOMS THAT SHOULD BE REPORTED IMMEDIATELY:  *FEVER GREATER THAN 100.5 F  *CHILLS WITH OR WITHOUT FEVER  NAUSEA AND VOMITING THAT IS NOT CONTROLLED WITH YOUR NAUSEA MEDICATION  *UNUSUAL SHORTNESS OF BREATH  *UNUSUAL BRUISING OR BLEEDING  TENDERNESS IN MOUTH AND THROAT WITH OR WITHOUT PRESENCE OF ULCERS  *URINARY PROBLEMS  *BOWEL PROBLEMS  UNUSUAL RASH Items with * indicate a potential emergency and should be followed up as soon as possible.  One of the nurses will contact you 24 hours after your treatment. Please let the nurse know about any problems that you may have experienced. Feel free to call the clinic you have any questions or concerns. The clinic phone number is [phone].   I have been informed and understand all the instructions given to me. I know to contact the clinic, my physician, or go to the Emergency Department if any problems should occur. I do not have any questions at this time, but understand that I may call the clinic during office hours   should I have any questions or need assistance in obtaining follow up care.    __________________________________________  _____________  __________ Signature of Patient or Authorized Representative            Date                   Time    __________________________________________ Nurse's Signature

## 2012-03-11 ENCOUNTER — Telehealth: Payer: Self-pay | Admitting: *Deleted

## 2012-03-11 ENCOUNTER — Telehealth: Payer: Self-pay | Admitting: Medical Oncology

## 2012-03-11 ENCOUNTER — Ambulatory Visit: Payer: Medicare Other

## 2012-03-11 ENCOUNTER — Ambulatory Visit: Payer: Medicare Other | Admitting: Internal Medicine

## 2012-03-11 NOTE — Telephone Encounter (Signed)
Message copied by Augusto Garbe on Wed Mar 11, 2012  1:15 PM ------      Message from: Caren Griffins      Created: Tue Mar 10, 2012 10:40 AM      Regarding: chemo f/u       1st time [facility] taxol/carbo, Dr Donnald Garre

## 2012-03-11 NOTE — Telephone Encounter (Signed)
Patient not at home at time of call.  Spouse says he is doing well.  She is eating and drinking well with no n/v.  He denies questions at this time.  Asked that he tell her to call if any questions or symptoms to report.

## 2012-03-11 NOTE — Telephone Encounter (Signed)
error 

## 2012-03-11 NOTE — Telephone Encounter (Signed)
Pt. Called back after message left.  States she is doing "pretty good".  She has taken her nausea meds twice for some queasiness.  No vomiting, diarrhea or constipation.  Denies mucositis.  Her throat does hurt some, which is not new.   She is able to eat and drink.  She does about 3 boosts per day and then soft food.  She is able to do about 2-3 16oz glasses of water per day.  She did have some questions about using the EMLA cream for her PAC which I answered.   She knows to call us for other problems or questions.

## 2012-03-11 NOTE — Telephone Encounter (Signed)
Erroneous encounter

## 2012-03-12 ENCOUNTER — Ambulatory Visit: Payer: Medicare Other

## 2012-03-12 ENCOUNTER — Encounter (HOSPITAL_COMMUNITY): Payer: Self-pay | Admitting: Pharmacy Technician

## 2012-03-13 ENCOUNTER — Telehealth: Payer: Self-pay | Admitting: *Deleted

## 2012-03-13 ENCOUNTER — Ambulatory Visit: Admission: RE | Admit: 2012-03-13 | Payer: Medicare Other | Source: Ambulatory Visit

## 2012-03-13 NOTE — Telephone Encounter (Signed)
Pt called stating "1 day ago she began coughing up clear mucus with clumps or lumps of blood in it". She states she has had intermittent episodes of sputum w/blood in it, but now it is every time she coughs. Pt denies fever, SOB, other problems. Pt completed radiation to chest on 03/06/12, last had chemo 03/10/12. Pt also has appt for labs and Dr Arbutus Ped on 03/17/12. Informed pt will discuss with Dr Basilio Cairo and call her back.  Spoke w/Dr Basilio Cairo who states "per what pt describes she does not need to go to dr at this time. However, if she develops cough that is mostly blood she needs to go immediately to ED". Called pt and informed her of Dr Colletta Maryland instructions. Pt verbalized understanding.

## 2012-03-17 ENCOUNTER — Other Ambulatory Visit: Payer: Medicare Other

## 2012-03-17 ENCOUNTER — Telehealth: Payer: Self-pay | Admitting: Radiation Oncology

## 2012-03-17 ENCOUNTER — Ambulatory Visit: Payer: Medicare Other

## 2012-03-17 ENCOUNTER — Telehealth: Payer: Self-pay | Admitting: Internal Medicine

## 2012-03-17 ENCOUNTER — Ambulatory Visit (HOSPITAL_BASED_OUTPATIENT_CLINIC_OR_DEPARTMENT_OTHER): Payer: Medicare Other | Admitting: Internal Medicine

## 2012-03-17 ENCOUNTER — Other Ambulatory Visit: Payer: Self-pay | Admitting: Physician Assistant

## 2012-03-17 ENCOUNTER — Ambulatory Visit (HOSPITAL_COMMUNITY): Payer: Medicare Other

## 2012-03-17 ENCOUNTER — Other Ambulatory Visit (HOSPITAL_BASED_OUTPATIENT_CLINIC_OR_DEPARTMENT_OTHER): Payer: Medicare Other | Admitting: Lab

## 2012-03-17 VITALS — BP 126/66 | HR 115 | Temp 98.4°F | Ht 65.0 in | Wt 130.8 lb

## 2012-03-17 DIAGNOSIS — R05 Cough: Secondary | ICD-10-CM

## 2012-03-17 DIAGNOSIS — C3490 Malignant neoplasm of unspecified part of unspecified bronchus or lung: Secondary | ICD-10-CM | POA: Diagnosis not present

## 2012-03-17 DIAGNOSIS — C3410 Malignant neoplasm of upper lobe, unspecified bronchus or lung: Secondary | ICD-10-CM

## 2012-03-17 DIAGNOSIS — R1310 Dysphagia, unspecified: Secondary | ICD-10-CM

## 2012-03-17 DIAGNOSIS — R112 Nausea with vomiting, unspecified: Secondary | ICD-10-CM | POA: Diagnosis not present

## 2012-03-17 LAB — CBC WITH DIFFERENTIAL/PLATELET
BASO%: 0.4 % (ref 0.0–2.0)
LYMPH%: 43.6 % (ref 14.0–49.7)
MCHC: 34 g/dL (ref 31.5–36.0)
MONO#: 0.3 10*3/uL (ref 0.1–0.9)
RBC: 4.01 10*6/uL (ref 3.70–5.45)
WBC: 2.5 10*3/uL — ABNORMAL LOW (ref 3.9–10.3)
lymph#: 1.1 10*3/uL (ref 0.9–3.3)
nRBC: 0 % (ref 0–0)

## 2012-03-17 MED ORDER — ONDANSETRON HCL 8 MG PO TABS: 8.0000 mg | ORAL_TABLET | Freq: Three times a day (TID) | ORAL | Status: AC | PRN

## 2012-03-17 MED ORDER — HYDROCODONE-HOMATROPINE 5-1.5 MG/5ML PO SYRP: 5.0000 mL | ORAL_SOLUTION | Freq: Four times a day (QID) | ORAL | Status: AC | PRN

## 2012-03-17 NOTE — Telephone Encounter (Signed)
Gv pt appt for [id number] 

## 2012-03-17 NOTE — Progress Notes (Signed)
[facility] [phone]   [phone]  OFFICE PROGRESS NOTE  [name], MD, MD [address] [place] [address]  DIAGNOSIS: Metastatic non-small cell lung cancer, squamous cell carcinoma diagnosed in March of 2013.   PRIOR THERAPY: Status post palliative radiotherapy to the left lung mass under the care of Dr. Michell Heinrich completed on 03/06/2012.   CURRENT THERAPY: Systemic chemotherapy with carboplatin for AUC of 6 on day 1 and Abraxane 100 mg/M2 on days 1, 8 and 15 every 3 weeks. The patient is here today to start cycle #1.   INTERVAL HISTORY: Destiny Harrison 71 y.o. female returns to the clinic today for followup visit accompanied by her son. The patient has several complaints after the first dose of her chemotherapy including nausea and occasional vomiting. Compazine is not helping. She also has a feeling of a lump in her throat since she started radiation therapy. She is currently on Carafate with mild improvement. The patient also continues to have dry cough started with radiotherapy, occasionally with blood-tinged sputum. She lost around 6 pounds since her last visit. She denied having any significant fever or chills. She has no chest pain or shortness breath except with exertion. She is here today to start dose #2 of the first cycle.   MEDICAL HISTORY: Past Medical History  Diagnosis Date  . COPD (chronic obstructive pulmonary disease)   . Cancer     ALLERGIES:   has no known allergies.  MEDICATIONS:  Current Outpatient Prescriptions  Medication Sig Dispense Refill  . albuterol (PROVENTIL HFA;VENTOLIN HFA) 108 (90 BASE) MCG/ACT inhaler Inhale 2 puffs into the lungs every 6 (six) hours as needed. WHEEZING AND SHORTNESS OF BREATH      . calcium-vitamin D (OSCAL WITH D) 500-200 MG-UNIT per tablet Take 1 tablet by mouth daily.      . folic acid (FOLVITE) 400 MCG tablet Take 400 mcg by mouth daily.      Marland Kitchen lidocaine-prilocaine (EMLA)  cream Apply topically as needed.  30 g  0  . predniSONE (DELTASONE) 5 MG tablet Take 5 mg by mouth daily.        . prochlorperazine (COMPAZINE) 10 MG tablet Take 10 mg by mouth every 6 (six) hours as needed. NAUSEA      . simvastatin (ZOCOR) 5 MG tablet Take 5 mg by mouth at bedtime.      . sucralfate (CARAFATE) 1 G tablet Take 1 tablet (1 g total) by mouth 4 (four) times daily. Dissolve 1 tablet in water prior to ingesting 4 times per day.  60 tablet  1  . tiotropium (SPIRIVA HANDIHALER) 18 MCG inhalation capsule Place 1 capsule (18 mcg total) into inhaler and inhale daily.  30 capsule  2  . HYDROcodone-homatropine (HYCODAN) 5-1.5 MG/5ML syrup Take 5 mLs by mouth every 6 (six) hours as needed for cough.  240 mL  0  . ondansetron (ZOFRAN) 8 MG tablet Take 1 tablet (8 mg total) by mouth every 8 (eight) hours as needed for nausea.  30 tablet  0    SURGICAL HISTORY:  Past Surgical History  Procedure Date  . Appendex 1962  . Video bronchoscopy 01/28/2012    Procedure: VIDEO BRONCHOSCOPY WITHOUT FLUORO;  Surgeon: Kalman Shan, MD;  Location: [facility] ENDOSCOPY;  Service: Endoscopy;  Laterality: Bilateral;  . Surgery on right wrist     REVIEW OF SYSTEMS:  A comprehensive review of systems was negative except for: Constitutional: positive for fatigue and  weight loss Respiratory: positive for cough, dyspnea on exertion and sputum Gastrointestinal: positive for dysphagia, nausea, odynophagia and vomiting   PHYSICAL EXAMINATION: General appearance: alert, cooperative and no distress Neck: no adenopathy Lymph nodes: Cervical, supraclavicular, and axillary nodes normal. Resp: clear to auscultation bilaterally Cardio: regular rate and rhythm, S1, S2 normal, no murmur, click, rub or gallop GI: soft, non-tender; bowel sounds normal; no masses,  no organomegaly Extremities: extremities normal, atraumatic, no cyanosis or edema Neurologic: Alert and oriented X 3, normal strength and tone. Normal symmetric  reflexes. Normal coordination and gait  ECOG PERFORMANCE STATUS: 1 - Symptomatic but completely ambulatory  Blood pressure 126/66, pulse 115, temperature 98.4 F (36.9 C), temperature source Oral, height 5\' 5"  (1.651 m), weight 130 lb 12.8 oz (59.33 kg).  LABORATORY DATA: Lab Results  Component Value Date   WBC 2.5* 03/17/2012   HGB 10.8* 03/17/2012   HCT 31.8* 03/17/2012   MCV 79.3* 03/17/2012   PLT 238 03/17/2012      Chemistry      Component Value Date/Time   NA 138 03/10/2012 0805   K 3.9 03/10/2012 0805   CL 103 03/10/2012 0805   CO2 27 03/10/2012 0805   BUN 7 03/10/2012 0805   CREATININE 0.63 03/10/2012 0805      Component Value Date/Time   CALCIUM 8.9 03/10/2012 0805   ALKPHOS 88 03/10/2012 0805   AST 19 03/10/2012 0805   ALT 11 03/10/2012 0805   BILITOT 0.4 03/10/2012 0805       RADIOGRAPHIC STUDIES: Mr [name] ZO Contrast  Mar 11, 2012  *RADIOLOGY REPORT*  Clinical Data: The lung cancer.  MRI HEAD WITHOUT AND WITH CONTRAST  Technique:  Multiplanar, multiecho pulse sequences of the brain and surrounding structures were obtained according to standard protocol without and with intravenous contrast  Contrast: 10mL MULTIHANCE GADOBENATE DIMEGLUMINE 529 MG/ML IV SOLN  Comparison: None available.  Findings: No acute infarct, hemorrhage, or mass lesion is present. The postcontrast images demonstrate no enhancement to suggest metastatic disease of the brain and meninges.  There is a focal 6 mm area of enhancement within the right parietal calvarium.  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  There is no other evidence for metastatic disease of the calvarium.  Degenerative changes are noted in the upper cervical spine.  Flow is present in the major intracranial arteries.  The globes and orbits are intact.  The paranasal sinuses and mastoid air cells are clear.  The ventricles are of normal size.  No significant extra-axial fluid collection is present.   IMPRESSION:  1.  No evidence for metastatic disease of the brain or meninges. 2.  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  Metastatic disease is considered less likely in the absence of other bony metastases.  Original Report Authenticated By: Jamesetta Orleans. MATTERN, M.D.   Nm Pet Image Restag (ps) Skull Base To Thigh  02/20/2012  *RADIOLOGY REPORT*  Clinical Data: Subsequent treatment strategy for lung cancer.  NUCLEAR MEDICINE PET SKULL BASE TO THIGH  Fasting Blood Glucose:  87  Technique:  18.9 mCi F-18 FDG was injected intravenously. CT data was obtained and used for attenuation correction and anatomic localization only.  (This was not acquired as a diagnostic CT examination.) Additional exam technical data entered on technologist worksheet.  Comparison:  CT chest 01/14/2012 and PET CT 11/07/2011.  Findings:  Neck: No hypermetabolic lymph nodes  in the neck. CT images show no acute findings.  Chest:  A 5 mm lymph node deep to the right clavicular head has an S U V max of 3.8.  A right paratracheal lymph node measures 9 mm and has an S U V max of 12.8.  There is AP window and subcarinal adenopathy with an S U V max of 14.7 in the AP window (9 mm).  Endobronchial lesion in the left upper lobe has an S U V max of 16.7.  No additional areas of abnormal hypermetabolism in the chest.  CT images show no acute findings.  There is extensive atherosclerotic calcification of the arterial vasculature.  No pericardial or pleural effusion.  Mild postobstructive changes in the left upper lobe, as before.  Abdomen/Pelvis:  No abnormal hypermetabolic activity within the liver, pancreas, adrenal glands, or spleen.  No hypermetabolic lymph nodes in the abdomen or pelvis. CT images show no acute findings.  No free fluid.  [place]:  There is a focal area of hypermetabolism associated with the lateral left eleventh rib, with a small amount of associated soft tissue and cortical  erosion (CT image 120). Findings appear new when compared 11/07/2011. 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  IMPRESSION:  Left upper lobe endobronchial mass with hypermetabolic adenopathy extending to the contralateral mediastinum and deep to the right clavicular head, all consistent with primary bronchogenic carcinoma.  Left eleventh rib lesion is highly worrisome for a metastatic lesion.  Original Report Authenticated By: Reyes Ivan, M.D.    ASSESSMENT: This is a very pleasant 71 years old [place] American female with metastatic non-small cell lung cancer status post palliative radiotherapy to the left lung mass and currently undergoing systemic chemotherapy with carboplatin and Abraxane status post 1 dose. The patient has several issues today including nausea vomiting as well as cough productive of blood-tinged sputum and dysphagia. Her total absolute neutrophil count is low today.  PLAN:  #1 I would cancel the dose of the chemotherapy. The patient will resume her chemotherapy next week as scheduled. #2 for cough, I started the patient on Hycodan 5 ML by mouth every 6 hours as needed. #3 for nausea and vomiting I started the patient on Zofran 8 mg by mouth Q8 hours as needed for nausea. #4 the patient would come back for followup visit in 2 weeks for evaluation before starting cycle #2. She was advised to call me immediately if she has any concerning symptoms in the interval.  All questions were answered. The patient knows to call the clinic with any problems, questions or concerns. We can certainly see the patient much sooner if necessary.

## 2012-03-17 NOTE — Telephone Encounter (Signed)
Phoned patient a home number listed in demographics to check status. Patient reports that she continues to have an intermittent productive cough with clump of blood. She denies fever and SOB. She was seen by Dr. Sofie Hartigan today who held her chemotherapy because of low white blood count. Patient reports nausea but, states that Dr. Sofie Hartigan gave her "a new medication to try." She is scheduled to have a port placed tomorrow. She expressed, "Dr. Sofie Hartigan plans to do chemotherapy again in a few weeks. Dr. Sofie Hartigan wrote her a "prescription for some cough medication." Also, patient reports that occasionally she feels like she had a lump in her throat. Encouraged patient to keep her follow up appointment with Dr. Michell Heinrich. Encouraged patient to call with needs. Patient verbalized understanding of all things reviewed. Routed this message to Dr. Michell Heinrich.

## 2012-03-18 ENCOUNTER — Ambulatory Visit: Admission: RE | Admit: 2012-03-18 | Payer: Medicare Other | Source: Ambulatory Visit

## 2012-03-18 ENCOUNTER — Telehealth (HOSPITAL_COMMUNITY): Payer: Self-pay | Admitting: Internal Medicine

## 2012-03-18 ENCOUNTER — Other Ambulatory Visit: Payer: Self-pay | Admitting: Radiology

## 2012-03-19 ENCOUNTER — Other Ambulatory Visit: Payer: Self-pay | Admitting: Internal Medicine

## 2012-03-19 ENCOUNTER — Ambulatory Visit (HOSPITAL_COMMUNITY)
Admission: RE | Admit: 2012-03-19 | Discharge: 2012-03-19 | Disposition: A | Discharge status: Home / Self Care | Payer: Medicare Other | Source: Ambulatory Visit | Attending: Internal Medicine | Admitting: Internal Medicine

## 2012-03-19 ENCOUNTER — Ambulatory Visit: Admission: RE | Admit: 2012-03-19 | Payer: Medicare Other | Source: Ambulatory Visit

## 2012-03-19 DIAGNOSIS — J449 Chronic obstructive pulmonary disease, unspecified: Secondary | ICD-10-CM | POA: Insufficient documentation

## 2012-03-19 DIAGNOSIS — C3490 Malignant neoplasm of unspecified part of unspecified bronchus or lung: Secondary | ICD-10-CM | POA: Diagnosis not present

## 2012-03-19 DIAGNOSIS — J4489 Other specified chronic obstructive pulmonary disease: Secondary | ICD-10-CM | POA: Insufficient documentation

## 2012-03-19 DIAGNOSIS — Z87891 Personal history of nicotine dependence: Secondary | ICD-10-CM | POA: Insufficient documentation

## 2012-03-19 DIAGNOSIS — Z09 Encounter for follow-up examination after completed treatment for conditions other than malignant neoplasm: Secondary | ICD-10-CM | POA: Diagnosis not present

## 2012-03-19 MED ORDER — FENTANYL CITRATE 0.05 MG/ML IJ SOLN
INTRAMUSCULAR | Status: AC
  Filled 2012-03-19: qty 4

## 2012-03-19 MED ORDER — MIDAZOLAM HCL 5 MG/5ML IJ SOLN
INTRAMUSCULAR | Status: AC | PRN
  Administered 2012-03-19: 2 mg via INTRAVENOUS

## 2012-03-19 MED ORDER — CEFAZOLIN SODIUM 1-5 GM-% IV SOLN
1.0000 g | Freq: Once | INTRAVENOUS | Status: AC
  Administered 2012-03-19: 1 g via INTRAVENOUS

## 2012-03-19 MED ORDER — HEPARIN SOD (PORK) LOCK FLUSH 100 UNIT/ML IV SOLN
500.0000 [IU] | Freq: Once | INTRAVENOUS | Status: AC
  Administered 2012-03-19: 500 [IU] via INTRAVENOUS

## 2012-03-19 MED ORDER — CEFAZOLIN SODIUM 1-5 GM-% IV SOLN
INTRAVENOUS | Status: AC
  Filled 2012-03-19: qty 50

## 2012-03-19 MED ORDER — LIDOCAINE HCL 1 % IJ SOLN
INTRAMUSCULAR | Status: AC
  Filled 2012-03-19: qty 20

## 2012-03-19 MED ORDER — FENTANYL CITRATE 0.05 MG/ML IJ SOLN
INTRAMUSCULAR | Status: AC | PRN
  Administered 2012-03-19: 100 ug via INTRAVENOUS

## 2012-03-19 MED ORDER — MIDAZOLAM HCL 2 MG/2ML IJ SOLN
INTRAMUSCULAR | Status: AC
  Filled 2012-03-19: qty 2

## 2012-03-19 MED ORDER — SODIUM CHLORIDE 0.9 % IV SOLN: INTRAVENOUS | Status: DC

## 2012-03-19 NOTE — ED Notes (Signed)
Patient is resting comfortably. 

## 2012-03-19 NOTE — Procedures (Signed)
Successful R IJ power port No comp Stable Tip svc/ra Ready for use

## 2012-03-19 NOTE — Discharge Instructions (Signed)

## 2012-03-19 NOTE — H&P (Signed)
Destiny Harrison is an 71 y.o. female.   Chief Complaint: access for chemotherapy HPI: copd, new diagnosis lung cancer, needs port catheter  Past Medical History  Diagnosis Date  . COPD (chronic obstructive pulmonary disease)   . Cancer     Past Surgical History  Procedure Date  . Appendex 1962  . Video bronchoscopy 01/28/2012    Procedure: VIDEO BRONCHOSCOPY WITHOUT FLUORO;  Surgeon: Kalman Shan, MD;  Location: [facility] ENDOSCOPY;  Service: Endoscopy;  Laterality: Bilateral;  . Surgery on right wrist     Family History  Problem Relation Age of Onset  . Diabetes Mother     insulin dependent  . Breast cancer Mother    Social History:  reports that she quit smoking about 2 years ago. Her smoking use included Cigarettes. She has a 20 pack-year smoking history. She has never used smokeless tobacco. She reports that she does not drink alcohol or use illicit drugs.  Allergies: No Known Allergies   (Not in a hospital admission)  Results for orders placed in visit on 03/17/12 (from the past 48 hour(s))  CBC WITH DIFFERENTIAL     Status: Abnormal   Collection Time   03/17/12  8:11 AM      Component Value Range Comment   WBC 2.5 (*) 3.9 - 10.3 (10e3/uL)    NEUT# 1.0 (*) 1.5 - 6.5 (10e3/uL)    HGB 10.8 (*) 11.6 - 15.9 (g/dL)    HCT 16.1 (*) 09.6 - 46.6 (%)    Platelets 238  145 - 400 (10e3/uL)    MCV 79.3 (*) 79.5 - 101.0 (fL)    MCH 26.9  25.1 - 34.0 (pg)    MCHC 34.0  31.5 - 36.0 (g/dL)    RBC 0.45  4.09 - 8.11 (10e6/uL)    RDW 13.7  11.2 - 14.5 (%)    lymph# 1.1  0.9 - 3.3 (10e3/uL)    MONO# 0.3  0.1 - 0.9 (10e3/uL)    Eosinophils Absolute 0.1  0.0 - 0.5 (10e3/uL)    Basophils Absolute 0.0  0.0 - 0.1 (10e3/uL)    NEUT% 41.2  38.4 - 76.8 (%)    LYMPH% 43.6  14.0 - 49.7 (%)    MONO% 12.0  0.0 - 14.0 (%)    EOS% 2.8  0.0 - 7.0 (%)    BASO% 0.4  0.0 - 2.0 (%)    nRBC 0  0 - 0 (%)    No results found.  Review of Systems  Constitutional: Negative for fever and chills.    Respiratory: Negative for cough.   Cardiovascular: Negative for chest pain and palpitations.  Gastrointestinal: Negative for abdominal pain.  Neurological: Negative for headaches.    Blood pressure 134/106, pulse 105, temperature 98.5 F (36.9 C), temperature source Oral, resp. rate 21, SpO2 95.00%. Physical Exam  Constitutional: She is oriented to person, place, and time. No distress.       Thin black female   Cardiovascular: Normal rate and regular rhythm.  Exam reveals no friction rub.   No murmur heard. Respiratory: Effort normal. No respiratory distress.  GI: Soft. Bowel sounds are normal. She exhibits no distension. There is no tenderness.  Neurological: She is alert and oriented to person, place, and time.  Skin: Skin is warm and dry. She is not diaphoretic.  Psychiatric: She has a normal mood and affect.     Assessment/Plan Power port cath placement today.  Kamil Mchaffie T. 03/19/2012, 7:58 AM

## 2012-03-20 ENCOUNTER — Ambulatory Visit: Payer: Medicare Other

## 2012-03-23 ENCOUNTER — Ambulatory Visit: Payer: Medicare Other

## 2012-03-24 ENCOUNTER — Other Ambulatory Visit (HOSPITAL_BASED_OUTPATIENT_CLINIC_OR_DEPARTMENT_OTHER): Payer: Medicare Other | Admitting: Lab

## 2012-03-24 ENCOUNTER — Ambulatory Visit (HOSPITAL_BASED_OUTPATIENT_CLINIC_OR_DEPARTMENT_OTHER): Payer: Medicare Other

## 2012-03-24 ENCOUNTER — Ambulatory Visit (INDEPENDENT_AMBULATORY_CARE_PROVIDER_SITE_OTHER): Payer: Medicare Other | Admitting: Internal Medicine

## 2012-03-24 ENCOUNTER — Ambulatory Visit: Payer: Medicare Other

## 2012-03-24 ENCOUNTER — Encounter: Payer: Self-pay | Admitting: Internal Medicine

## 2012-03-24 VITALS — BP 140/67 | HR 82 | Temp 97.4°F

## 2012-03-24 VITALS — BP 118/72 | HR 96 | Temp 97.8°F | Ht 64.5 in | Wt 137.4 lb

## 2012-03-24 DIAGNOSIS — J449 Chronic obstructive pulmonary disease, unspecified: Secondary | ICD-10-CM

## 2012-03-24 DIAGNOSIS — C3490 Malignant neoplasm of unspecified part of unspecified bronchus or lung: Secondary | ICD-10-CM

## 2012-03-24 DIAGNOSIS — C3410 Malignant neoplasm of upper lobe, unspecified bronchus or lung: Secondary | ICD-10-CM | POA: Diagnosis not present

## 2012-03-24 DIAGNOSIS — D702 Other drug-induced agranulocytosis: Secondary | ICD-10-CM

## 2012-03-24 DIAGNOSIS — J4489 Other specified chronic obstructive pulmonary disease: Secondary | ICD-10-CM

## 2012-03-24 LAB — COMPREHENSIVE METABOLIC PANEL
ALT: 10 U/L (ref 0–35)
Alkaline Phosphatase: 67 U/L (ref 39–117)
Potassium: 3.9 mEq/L (ref 3.5–5.3)
Sodium: 138 mEq/L (ref 135–145)
Total Bilirubin: 0.4 mg/dL (ref 0.3–1.2)
Total Protein: 6.4 g/dL (ref 6.0–8.3)

## 2012-03-24 LAB — CBC WITH DIFFERENTIAL/PLATELET
BASO%: 0.7 % (ref 0.0–2.0)
LYMPH%: 48.1 % (ref 14.0–49.7)
MCHC: 33.8 g/dL (ref 31.5–36.0)
MCV: 79.9 fL (ref 79.5–101.0)
MONO#: 0.8 10*3/uL (ref 0.1–0.9)
MONO%: 26.8 % — ABNORMAL HIGH (ref 0.0–14.0)
Platelets: 268 10*3/uL (ref 145–400)
RBC: 3.78 10*6/uL (ref 3.70–5.45)
RDW: 13.9 % (ref 11.2–14.5)
WBC: 2.9 10*3/uL — ABNORMAL LOW (ref 3.9–10.3)

## 2012-03-24 MED ORDER — IPRATROPIUM BROMIDE 0.02 % IN SOLN: 500.0000 ug | Freq: Four times a day (QID) | RESPIRATORY_TRACT | Status: DC

## 2012-03-24 MED ORDER — PACLITAXEL PROTEIN-BOUND CHEMO INJECTION 100 MG: 100.0000 mg/m2 | Freq: Once | INTRAVENOUS | Status: DC

## 2012-03-24 MED ORDER — SODIUM CHLORIDE 0.9 % IJ SOLN
10.0000 mL | INTRAMUSCULAR | Status: DC | PRN
  Administered 2012-03-24: 10 mL
  Filled 2012-03-24: qty 10

## 2012-03-24 MED ORDER — SODIUM CHLORIDE 0.9 % IV SOLN
Freq: Once | INTRAVENOUS | Status: AC
  Administered 2012-03-24: 09:00:00 via INTRAVENOUS

## 2012-03-24 MED ORDER — DEXAMETHASONE SODIUM PHOSPHATE 10 MG/ML IJ SOLN
10.0000 mg | Freq: Once | INTRAMUSCULAR | Status: AC
  Administered 2012-03-24: 10 mg via INTRAVENOUS

## 2012-03-24 MED ORDER — HEPARIN SOD (PORK) LOCK FLUSH 100 UNIT/ML IV SOLN
500.0000 [IU] | Freq: Once | INTRAVENOUS | Status: AC | PRN
  Administered 2012-03-24: 500 [IU]
  Filled 2012-03-24: qty 5

## 2012-03-24 MED ORDER — ONDANSETRON 8 MG/50ML IVPB (CHCC)
8.0000 mg | Freq: Once | INTRAVENOUS | Status: AC
  Administered 2012-03-24: 8 mg via INTRAVENOUS

## 2012-03-24 MED ORDER — ALBUTEROL SULFATE (2.5 MG/3ML) 0.083% IN NEBU: 2.5000 mg | INHALATION_SOLUTION | RESPIRATORY_TRACT | Status: DC | PRN

## 2012-03-24 NOTE — Progress Notes (Signed)
Mrs. Mowbray did not get treated because her absolute neutraphil count was 700.

## 2012-03-24 NOTE — Patient Instructions (Addendum)
#  COPD  - you have copd that is moderate and is stable - stop spiriva and symbicort  - instead start albuterol nebulizer 4 times daily + atrovent nebulizer 4 times daily scheduled  - use qvar inhaler 2 puff twice daily scheduled  - take some samples  -  generic ventolin inhaler as needed - - If the above is not resulting in lower costs, let me know asap - will discuss rehab at followup  #Lung Cancer   - per Dr Arbutus Ped  #followup   In oct 2013 with CAT score at followup  please call us or let us know anytime if you are not feeling well

## 2012-03-24 NOTE — Progress Notes (Signed)
Subjective:    Patient ID: Destiny Harrison, female    DOB: 24-Jan-1941, 71 y.o.   MRN: [id number]  HPI    #Ex-smoker: quit smoking about 2 years ago but had 20 pack smokng hx.    # RA - from age 45. Dr Dierdre Forth. Prednisone and Methotrexate  #COPD - moderate   -  PFTs - Gold stage 2 copd with asthma component. FEv1 1L/54%., 14% BD response on FVC, ratio 54, DLCO 42%  #NSCLC - diagnosed and  metastatic 2013 with rib lesion   - CT Chest 02/20/12 Left upper lobe endobronchial mass with hypermetabolic adenopathy extending to the contralateral mediastinum and deep to the right clavicular head, all consistent with primary bronchogenic carcinoma. Left eleventh rib lesion is highly worrisome for a metastatic lesion.    - status post palliative radiotherapy to the left lung mass and currently (as of 02/20/12)  undergoing systemic chemotherapy with carboplatin and Abraxane status post 1 dose.   based on bronchoscopy   OV 03/24/2012  Followup COPD moderate disease. IN interim since last visit diagnosed with NSCLC with mets to rib. Undergoing chemo but last cycle and today's cycle witheld due to low wbc. She is upset that her cancer Rx is getting delayed. In terms of copd, feeling baseline. In fact, dyspnea improved. She had some worsening cough and some hemoptysis last week but this is under control with hycodan. SHe denies fever, cold, sputum, edema, wheeze or even fatigue different from baseline.  She is gaining weight of note with cancer rX. HEr main other concern is cost of Rx and psychosocial stress  THough stable CAT copd score is 31 and reflects high symptom burden  CAT COPD Symptom and Quality of Life Score ([employer])  0 (no burden) to 5 (highest burden)  Never Cough -> Cough all the time 4  No phlegm in chest -> Chest is full of phlegm 4  No chest tightness -> Chest feels very tight 4  No dyspnea for 1 flight stairs/hill -> Very dyspneic for 1 flight of stairs 4  No limitations  for ADL at home -> Very limited with ADL at home 3  Confident leaving home -> Not at all confident leaving home 3  Sleep soundly -> Do not sleep soundly because of lung condition 5  Lots of Energy -> No energy at all 4  TOTAL Score (max 40)  31      Past, Family, Social reviewed:as above + lot of $ stress and emotional stres due to illness and dealing with son who has ESRD and is on transplant list  Labs  Lab 03/24/12 0805  HGB 10.2*  HCT 30.2*  WBC 2.9*  PLT 268        Review of Systems  Constitutional: Negative for fever and unexpected weight change.  HENT: Negative for ear pain, nosebleeds, congestion, sore throat, rhinorrhea, sneezing, trouble swallowing, dental problem, postnasal drip and sinus pressure.   Eyes: Negative for redness and itching.  Respiratory: Negative for cough, chest tightness, shortness of breath and wheezing.   Cardiovascular: Negative for palpitations and leg swelling.  Gastrointestinal: Negative for nausea and vomiting.  Genitourinary: Negative for dysuria.  Musculoskeletal: Negative for joint swelling.  Skin: Negative for rash.  Neurological: Negative for headaches.  Hematological: Does not bruise/bleed easily.  Psychiatric/Behavioral: Negative for dysphoric mood. The patient is not nervous/anxious.        Objective:   Physical Exam Vitals reviewed. Constitutional: She is oriented to person, place, and  time. She appears well-developed and well-nourished. No distress.       Body mass index is 19.98 kg/(m^2). - Last ov Body mass index is 20.43 kg/(m^2). 11/18/2011 Body mass index is 23.22 kg/(m^2). - June 2013      HENT:  Head: Normocephalic and atraumatic.  Right Ear: External ear normal.  Left Ear: External ear normal.  Mouth/Throat: Oropharynx is clear and moist. No oropharyngeal exudate.  Eyes: Conjunctivae and EOM are normal. Pupils are equal, round, and reactive to light. Right eye exhibits no discharge. Left eye exhibits no  discharge. No scleral icterus.  Neck: Normal range of motion. Neck supple. No JVD present. No tracheal deviation present. No thyromegaly present.  Cardiovascular: Normal rate, regular rhythm, normal heart sounds and intact distal pulses.  Exam reveals no gallop and no friction rub.   No murmur heard. Pulmonary/Chest: Effort normal and breath sounds normal. No respiratory distress. She has no wheezes. She has no rales. She exhibits no tenderness.  Abdominal: Soft. Bowel sounds are normal. She exhibits no distension and no mass. There is no tenderness. There is no rebound and no guarding.  Musculoskeletal: Normal range of motion. She exhibits no edema and no tenderness.       RA+  Lymphadenopathy:    She has no cervical adenopathy.  Neurological: She is alert and oriented to person, place, and time. She has normal reflexes. No cranial nerve deficit. She exhibits normal muscle tone. Coordination normal.  Skin: Skin is warm and dry. No rash noted. She is not diaphoretic. No erythema. No pallor.  Psychiatric: She has a normal mood and affect. Her behavior is normal. Judgment and thought content normal.       Spiritual +         Assessment & Plan:

## 2012-03-24 NOTE — Assessment & Plan Note (Signed)
#  COPD  - you have copd that is moderate and is stable - stop spiriva and symbicort  - instead start albuterol nebulizer 4 times daily + atrovent nebulizer 4 times daily scheduled  - use qvar inhaler 2 puff twice daily scheduled  - take some samples  -  generic ventolin inhaler as needed - - If the above is not resulting in lower costs, let me know asap  #Lung Cancer   - per Dr Arbutus Ped  #followup   In oct 2013 with CAT score at followup  please call us or let us know anytime if you are not feeling well Discuss rehab at followup

## 2012-03-25 ENCOUNTER — Ambulatory Visit: Payer: Medicare Other

## 2012-03-26 ENCOUNTER — Ambulatory Visit
Admission: RE | Admit: 2012-03-26 | Discharge: 2012-03-26 | Disposition: A | Discharge status: Home / Self Care | Payer: Medicare Other | Source: Ambulatory Visit | Attending: Radiation Oncology | Admitting: Radiation Oncology

## 2012-03-26 ENCOUNTER — Encounter: Payer: Self-pay | Admitting: Radiation Oncology

## 2012-03-26 ENCOUNTER — Ambulatory Visit: Payer: Medicare Other

## 2012-03-26 VITALS — BP 137/70 | HR 88 | Temp 97.1°F | Resp 18 | Wt 135.8 lb

## 2012-03-26 DIAGNOSIS — C3490 Malignant neoplasm of unspecified part of unspecified bronchus or lung: Secondary | ICD-10-CM

## 2012-03-26 NOTE — Progress Notes (Signed)
Department of Radiation Oncology  Phone:  [phone] Fax:        [phone]   Name: Kaedence Connelly   DOB: 1941-09-16  MRN: [id number]    Date: 03/26/2012  Follow Up Visit Note  CC: [name], MD  Diagnosis: Metastatic non-small cell lung cancer  Interval since last radiation: One month  Allergies: No Known Allergies  Medications:  Current Outpatient Prescriptions  Medication Sig Dispense Refill  . albuterol (PROVENTIL HFA;VENTOLIN HFA) 108 (90 BASE) MCG/ACT inhaler Inhale 2 puffs into the lungs every 6 (six) hours as needed. WHEEZING AND SHORTNESS OF BREATH      . albuterol (PROVENTIL) (2.5 MG/3ML) 0.083% nebulizer solution Take 3 mLs (2.5 mg total) by nebulization every 4 (four) hours as needed for wheezing.  75 mL  12  . calcium-vitamin D (OSCAL WITH D) 500-200 MG-UNIT per tablet Take 1 tablet by mouth daily.      . folic acid (FOLVITE) 400 MCG tablet Take 400 mcg by mouth daily.      Marland Kitchen HYDROcodone-homatropine (HYCODAN) 5-1.5 MG/5ML syrup Take 5 mLs by mouth every 6 (six) hours as needed for cough.  240 mL  0  . ipratropium (ATROVENT) 0.02 % nebulizer solution Take 2.5 mLs (500 mcg total) by nebulization 4 (four) times daily.  75 mL  12  . lidocaine-prilocaine (EMLA) cream Apply topically as needed.  30 g  0  . ondansetron (ZOFRAN) 8 MG tablet       . predniSONE (DELTASONE) 5 MG tablet Take 5 mg by mouth daily.        . prochlorperazine (COMPAZINE) 10 MG tablet Take 10 mg by mouth every 6 (six) hours as needed. NAUSEA      . simvastatin (ZOCOR) 5 MG tablet Take 5 mg by mouth at bedtime.      Marland Kitchen SPIRIVA HANDIHALER 18 MCG inhalation capsule       . sucralfate (CARAFATE) 1 G tablet Take 1 tablet (1 g total) by mouth 4 (four) times daily. Dissolve 1 tablet in water prior to ingesting 4 times per day.  60 tablet  1    Interval History: Natalye presents today for routine followup.  Her cough is improved with some cough syrup she got from Dr. Shirline Frees. She is not having any  hemoptysis. She has occasional headache. She is neutropenic and is only completed 2 cycles of chemotherapy. She is using her nebulizers but is not on home oxygen. She has been using her Carafate to "keep her mouth open" she denies any dysphagia symptoms. His followup with Dr. Arbutus Ped on the 18th and labs on the 11th.  Physical Exam:   weight is 135 lb 12.8 oz (61.598 kg). Her oral temperature is 97.1 F (36.2 C). Her blood pressure is 137/70 and her pulse is 88. Her respiration is 18 and oxygen saturation is 97%.  She is a pleasant female in no distress sitting comfortably examining table. She looks well and is not coughing. She has some slight hyperpigmentation or posterior left chest  IMPRESSION: Metastatic non-small cell lung cancer status post palliative radiation to the mediastinum and hilum with resolution of hemoptysis  PLAN:  Louine is a 71 y.o. female who has tolerated her treatments well. I'm hopeful that the radiation will be able to wean her off of her cough syrup. He clearly has stopped her hemoptysis which is what brought her in in the first place. She will continue chemotherapy under the care of Dr. Shirline Frees. I be happy to see her  back at any point future. I have not scheduled followup with her. She knows she does call us with any questions or concerns.    Lurline Hare, MD

## 2012-03-26 NOTE — Progress Notes (Signed)
Patient presents to the clinic today unaccompanied for a one month follow up with Dr. Michell Heinrich. Patient is alert and oriented to person, place, and time. No distress noted. Steady gait noted. Pleasant affect noted. Patient denies pain at this time. Patient reports that she has a sore under her plate on the bottom of her mouth. Patient reports about every 3-4 days she has a brief throbbing headache which goes away quickly therefore she doesn't take any medication. Weight stable. Persistent dry cough noted. Patient reports that hydrocodone and homatropine cough syrups help greatly. Patient reports shortness of breath with exertion. Patient reports using her nebulizer qid for 15 minutes. Patient denies using oxygen therapy. Patient denies coughing up blood. Patient reports using carafate as directed which makes it easier for her to swallow. Patient reports that the last two chemo treatment have been held because of low counts. Patient to return for labs 6/11 the, hopefully get a tx on 6/18 and again on 6/25. Patient denies nausea, vomiting, dizziness or diarrhea.  Reported all findings to Dr. Michell Heinrich.

## 2012-03-27 ENCOUNTER — Ambulatory Visit: Payer: Medicare Other

## 2012-03-30 ENCOUNTER — Ambulatory Visit: Payer: Medicare Other

## 2012-03-30 NOTE — Progress Notes (Signed)
Encounter addended by: Tessa Lerner, RN on: 03/30/2012  3:26 PM<BR>     Documentation filed: Charges VN

## 2012-03-31 ENCOUNTER — Encounter: Payer: Self-pay | Admitting: Physician Assistant

## 2012-03-31 ENCOUNTER — Ambulatory Visit: Payer: Medicare Other

## 2012-03-31 ENCOUNTER — Telehealth: Payer: Self-pay | Admitting: Internal Medicine

## 2012-03-31 ENCOUNTER — Telehealth: Payer: Self-pay | Admitting: *Deleted

## 2012-03-31 ENCOUNTER — Other Ambulatory Visit (HOSPITAL_BASED_OUTPATIENT_CLINIC_OR_DEPARTMENT_OTHER): Payer: Medicare Other | Admitting: Lab

## 2012-03-31 ENCOUNTER — Ambulatory Visit (HOSPITAL_BASED_OUTPATIENT_CLINIC_OR_DEPARTMENT_OTHER): Payer: Medicare Other | Admitting: Physician Assistant

## 2012-03-31 ENCOUNTER — Other Ambulatory Visit: Payer: Medicare Other | Admitting: Lab

## 2012-03-31 VITALS — BP 132/70 | HR 104 | Temp 98.6°F | Ht 64.5 in | Wt 133.5 lb

## 2012-03-31 DIAGNOSIS — D709 Neutropenia, unspecified: Secondary | ICD-10-CM

## 2012-03-31 DIAGNOSIS — C3490 Malignant neoplasm of unspecified part of unspecified bronchus or lung: Secondary | ICD-10-CM | POA: Diagnosis not present

## 2012-03-31 DIAGNOSIS — C3410 Malignant neoplasm of upper lobe, unspecified bronchus or lung: Secondary | ICD-10-CM

## 2012-03-31 LAB — COMPREHENSIVE METABOLIC PANEL
BUN: 11 mg/dL (ref 6–23)
CO2: 26 mEq/L (ref 19–32)
Glucose, Bld: 128 mg/dL — ABNORMAL HIGH (ref 70–99)
Sodium: 138 mEq/L (ref 135–145)
Total Bilirubin: 0.6 mg/dL (ref 0.3–1.2)
Total Protein: 6.7 g/dL (ref 6.0–8.3)

## 2012-03-31 LAB — CBC WITH DIFFERENTIAL/PLATELET
Basophils Absolute: 0 10*3/uL (ref 0.0–0.1)
Eosinophils Absolute: 0.1 10*3/uL (ref 0.0–0.5)
HCT: 34.4 % — ABNORMAL LOW (ref 34.8–46.6)
HGB: 11.3 g/dL — ABNORMAL LOW (ref 11.6–15.9)
LYMPH%: 38.1 % (ref 14.0–49.7)
MCV: 83.5 fL (ref 79.5–101.0)
MONO#: 0.5 10*3/uL (ref 0.1–0.9)
MONO%: 15.2 % — ABNORMAL HIGH (ref 0.0–14.0)
NEUT#: 1.6 10*3/uL (ref 1.5–6.5)
NEUT%: 44.4 % (ref 38.4–76.8)
Platelets: 250 10*3/uL (ref 145–400)
RBC: 4.11 10*6/uL (ref 3.70–5.45)
WBC: 3.6 10*3/uL — ABNORMAL LOW (ref 3.9–10.3)

## 2012-03-31 NOTE — Telephone Encounter (Signed)
appts made and printed for pt,pt aware that tx will be added for 7/9.email to mw to add tx    aom

## 2012-03-31 NOTE — Telephone Encounter (Signed)
Per staff message from Anne, I have scheduled treatments.  JMW  

## 2012-04-01 ENCOUNTER — Ambulatory Visit (HOSPITAL_BASED_OUTPATIENT_CLINIC_OR_DEPARTMENT_OTHER): Payer: Medicare Other

## 2012-04-01 ENCOUNTER — Ambulatory Visit: Payer: Medicare Other

## 2012-04-01 VITALS — BP 125/62 | HR 98 | Temp 98.4°F

## 2012-04-01 DIAGNOSIS — C3410 Malignant neoplasm of upper lobe, unspecified bronchus or lung: Secondary | ICD-10-CM | POA: Diagnosis not present

## 2012-04-01 DIAGNOSIS — Z5111 Encounter for antineoplastic chemotherapy: Secondary | ICD-10-CM | POA: Diagnosis not present

## 2012-04-01 DIAGNOSIS — C3490 Malignant neoplasm of unspecified part of unspecified bronchus or lung: Secondary | ICD-10-CM

## 2012-04-01 MED ORDER — SODIUM CHLORIDE 0.9 % IJ SOLN
10.0000 mL | INTRAMUSCULAR | Status: DC | PRN
  Administered 2012-04-01: 10 mL
  Filled 2012-04-01: qty 10

## 2012-04-01 MED ORDER — SODIUM CHLORIDE 0.9 % IV SOLN
Freq: Once | INTRAVENOUS | Status: AC
  Administered 2012-04-01: 16:00:00 via INTRAVENOUS

## 2012-04-01 MED ORDER — PACLITAXEL PROTEIN-BOUND CHEMO INJECTION 100 MG
100.0000 mg/m2 | Freq: Once | INTRAVENOUS | Status: AC
  Administered 2012-04-01: 175 mg via INTRAVENOUS
  Filled 2012-04-01: qty 35

## 2012-04-01 MED ORDER — HEPARIN SOD (PORK) LOCK FLUSH 100 UNIT/ML IV SOLN
500.0000 [IU] | Freq: Once | INTRAVENOUS | Status: AC | PRN
  Administered 2012-04-01: 500 [IU]
  Filled 2012-04-01: qty 5

## 2012-04-01 MED ORDER — ONDANSETRON 16 MG/50ML IVPB (CHCC)
16.0000 mg | Freq: Once | INTRAVENOUS | Status: AC
  Administered 2012-04-01: 16 mg via INTRAVENOUS

## 2012-04-01 MED ORDER — SODIUM CHLORIDE 0.9 % IV SOLN
490.0000 mg | Freq: Once | INTRAVENOUS | Status: AC
  Administered 2012-04-01: 490 mg via INTRAVENOUS
  Filled 2012-04-01: qty 49

## 2012-04-01 MED ORDER — DEXAMETHASONE SODIUM PHOSPHATE 4 MG/ML IJ SOLN
20.0000 mg | Freq: Once | INTRAMUSCULAR | Status: AC
  Administered 2012-04-01: 20 mg via INTRAVENOUS

## 2012-04-01 NOTE — Patient Instructions (Signed)
 Cancer Center Discharge Instructions for Patients Receiving Chemotherapy  Today you received the following chemotherapy agents Abraxane and Carboplatin  To help prevent nausea and vomiting after your treatment, we encourage you to take your nausea medication  Begin taking it at 7 pm and take it as often as prescribed for the next 24 to 72 hours.   If you develop nausea and vomiting that is not controlled by your nausea medication, call the clinic. If it is after clinic hours your family physician or the after hours number for the clinic or go to the Emergency Department.   BELOW ARE SYMPTOMS THAT SHOULD BE REPORTED IMMEDIATELY:  *FEVER GREATER THAN 100.5 F  *CHILLS WITH OR WITHOUT FEVER  NAUSEA AND VOMITING THAT IS NOT CONTROLLED WITH YOUR NAUSEA MEDICATION  *UNUSUAL SHORTNESS OF BREATH  *UNUSUAL BRUISING OR BLEEDING  TENDERNESS IN MOUTH AND THROAT WITH OR WITHOUT PRESENCE OF ULCERS  *URINARY PROBLEMS  *BOWEL PROBLEMS  UNUSUAL RASH Items with * indicate a potential emergency and should be followed up as soon as possible.  One of the nurses will contact you 24 hours after your treatment. Please let the nurse know about any problems that you may have experienced. Feel free to call the clinic you have any questions or concerns. The clinic phone number is [phone].   I have been informed and understand all the instructions given to me. I know to contact the clinic, my physician, or go to the Emergency Department if any problems should occur. I do not have any questions at this time, but understand that I may call the clinic during office hours   should I have any questions or need assistance in obtaining follow up care.    __________________________________________  _____________  __________ Signature of Patient or Authorized Representative            Date                   Time    __________________________________________ Nurse's Signature

## 2012-04-02 ENCOUNTER — Ambulatory Visit: Admission: RE | Admit: 2012-04-02 | Payer: Medicare Other | Source: Ambulatory Visit

## 2012-04-03 ENCOUNTER — Ambulatory Visit: Payer: Medicare Other

## 2012-04-03 NOTE — Progress Notes (Signed)
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   PRIOR THERAPY: Status post palliative radiotherapy to the left lung mass under the care of Dr. Michell Heinrich completed on 03/06/2012.   CURRENT THERAPY: Systemic chemotherapy with carboplatin for AUC of 6 on day 1 and Abraxane 100 mg/M2 on days 1, 8 and 15 every 3 weeks. Status post 1 cycle   INTERVAL HISTORY: Destiny Harrison 71 y.o. female returns to the clinic today for followup visit accompanied by her son. She tolerated her first cycle of chemotherapy relatively well with the exception of some nausea. The nausea is well-controlled with her Compazine. She voiced no other specific complaints today. She denied having any significant fever or chills. She has no chest pain or shortness breath except with exertion.   MEDICAL HISTORY: Past Medical History  Diagnosis Date  . COPD (chronic obstructive pulmonary disease)   . Cancer     ALLERGIES:   has no known allergies.  MEDICATIONS:  Current Outpatient Prescriptions  Medication Sig Dispense Refill  . albuterol (PROVENTIL HFA;VENTOLIN HFA) 108 (90 BASE) MCG/ACT inhaler Inhale 2 puffs into the lungs every 6 (six) hours as needed. WHEEZING AND SHORTNESS OF BREATH      . albuterol (PROVENTIL) (2.5 MG/3ML) 0.083% nebulizer solution Take 3 mLs (2.5 mg total) by nebulization every 4 (four) hours as needed for wheezing.  75 mL  12  . calcium-vitamin D (OSCAL WITH D) 500-200 MG-UNIT per tablet Take 1 tablet by mouth daily.      . folic acid (FOLVITE) 400 MCG tablet Take 400 mcg by mouth daily.      Marland Kitchen ipratropium (ATROVENT) 0.02 % nebulizer solution Take 2.5 mLs (500 mcg total) by nebulization 4 (four) times daily.  75 mL  12  . lidocaine-prilocaine (EMLA) cream Apply topically as needed.   30 g  0  . ondansetron (ZOFRAN) 8 MG tablet       . predniSONE (DELTASONE) 5 MG tablet Take 5 mg by mouth daily.        . prochlorperazine (COMPAZINE) 10 MG tablet Take 10 mg by mouth every 6 (six) hours as needed. NAUSEA      . simvastatin (ZOCOR) 5 MG tablet Take 5 mg by mouth at bedtime.      Marland Kitchen SPIRIVA HANDIHALER 18 MCG inhalation capsule       . sucralfate (CARAFATE) 1 G tablet Take 1 tablet (1 g total) by mouth 4 (four) times daily. Dissolve 1 tablet in water prior to ingesting 4 times per day.  60 tablet  1    SURGICAL HISTORY:  Past Surgical History  Procedure Date  . Appendex 1962  . Video bronchoscopy 01/28/2012    Procedure: VIDEO BRONCHOSCOPY WITHOUT FLUORO;  Surgeon: Kalman Shan, MD;  Location: [facility] ENDOSCOPY;  Service: Endoscopy;  Laterality: Bilateral;  . Surgery on right wrist     REVIEW OF SYSTEMS:  A comprehensive review of systems was negative except for: Gastrointestinal: positive for nausea   PHYSICAL EXAMINATION: General appearance: alert, cooperative and no distress Neck: no adenopathy Lymph nodes: Cervical, supraclavicular, and axillary nodes normal. Resp: clear to auscultation bilaterally Cardio: regular rate and rhythm, S1, S2 normal, no murmur, click, rub or gallop GI: soft, non-tender; bowel sounds normal; no masses,  no organomegaly Extremities: extremities normal, atraumatic, no  cyanosis or edema Neurologic: Alert and oriented X 3, normal strength and tone. Normal symmetric reflexes. Normal coordination and gait  ECOG PERFORMANCE STATUS: 1 - Symptomatic but completely ambulatory  Blood pressure 132/70, pulse 104, temperature 98.6 F (37 C), temperature source Oral, height 5' 4.5" (1.638 m), weight 133 lb 8 oz (60.555 kg).  LABORATORY DATA: Lab Results  Component Value Date   WBC 3.6* 03/31/2012   HGB 11.3* 03/31/2012   HCT 34.4* 03/31/2012   MCV 83.5 03/31/2012   PLT 250 03/31/2012      Chemistry      Component Value Date/Time   NA 138 03/31/2012  1050   K 3.8 03/31/2012 1050   CL 104 03/31/2012 1050   CO2 26 03/31/2012 1050   BUN 11 03/31/2012 1050   CREATININE 0.73 03/31/2012 1050      Component Value Date/Time   CALCIUM 8.7 03/31/2012 1050   ALKPHOS 65 03/31/2012 1050   AST 16 03/31/2012 1050   ALT 11 03/31/2012 1050   BILITOT 0.6 03/31/2012 1050       RADIOGRAPHIC STUDIES: Mr Destiny Harrison UU Contrast  03-17-12  *RADIOLOGY REPORT*  Clinical Data: The lung cancer.  MRI HEAD WITHOUT AND WITH CONTRAST  Technique:  Multiplanar, multiecho pulse sequences of the brain and surrounding structures were obtained according to standard protocol without and with intravenous contrast  Contrast: 10mL MULTIHANCE GADOBENATE DIMEGLUMINE 529 MG/ML IV SOLN  Comparison: None available.  Findings: No acute infarct, hemorrhage, or mass lesion is present. The postcontrast images demonstrate no enhancement to suggest metastatic disease of the brain and meninges.  There is a focal 6 mm area of enhancement within the right parietal calvarium.  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  There is no other evidence for metastatic disease of the calvarium.  Degenerative changes are noted in the upper cervical spine.  Flow is present in the major intracranial arteries.  The globes and orbits are intact.  The paranasal sinuses and mastoid air cells are clear.  The ventricles are of normal size.  No significant extra-axial fluid collection is present.  IMPRESSION:  1.  No evidence for metastatic disease of the brain or meninges. 2.  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  Metastatic disease is considered less likely in the absence of other bony metastases.  Original Report Authenticated By: Jamesetta Orleans. MATTERN, M.D.   Nm Pet Image Restag (ps) Skull Base To Thigh  02/20/2012  *RADIOLOGY REPORT*  Clinical Data: Subsequent treatment strategy for lung cancer.  NUCLEAR MEDICINE PET SKULL  BASE TO THIGH  Fasting Blood Glucose:  87  Technique:  18.9 mCi F-18 FDG was injected intravenously. CT data was obtained and used for attenuation correction and anatomic localization only.  (This was not acquired as a diagnostic CT examination.) Additional exam technical data entered on technologist worksheet.  Comparison:  CT chest 01/14/2012 and PET CT 11/07/2011.  Findings:  Neck: No hypermetabolic lymph nodes in the neck. CT images show no acute findings.  Chest:  A 5 mm lymph node deep to the right clavicular head has an S U V max of 3.8.  A right paratracheal lymph node measures 9 mm and has an S U V max of 12.8.  There is AP window and subcarinal adenopathy with an S U V max of 14.7 in the AP window (9 mm).  Endobronchial lesion in the  left upper lobe has an S U V max of 16.7.  No additional areas of abnormal hypermetabolism in the chest.  CT images show no acute findings.  There is extensive atherosclerotic calcification of the arterial vasculature.  No pericardial or pleural effusion.  Mild postobstructive changes in the left upper lobe, as before.  Abdomen/Pelvis:  No abnormal hypermetabolic activity within the liver, pancreas, adrenal glands, or spleen.  No hypermetabolic lymph nodes in the abdomen or pelvis. CT images show no acute findings.  No free fluid.  [place]:  There is a focal area of hypermetabolism associated with the lateral left eleventh rib, with a small amount of associated soft tissue and cortical erosion (CT image 120). Findings appear new when compared 11/07/2011. 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  IMPRESSION:  Left upper lobe endobronchial mass with hypermetabolic adenopathy extending to the contralateral mediastinum and deep to the right clavicular head, all consistent with primary bronchogenic carcinoma.  Left eleventh rib lesion is highly worrisome for a metastatic lesion.  Original Report Authenticated By: Reyes Ivan, M.D.    ASSESSMENT/PLAN: This is  a very pleasant 71 years old [place] American female with metastatic non-small cell lung cancer status post palliative radiotherapy to the left lung mass and currently undergoing systemic chemotherapy with carboplatin and Abraxane status post 1 cycle. Patient was discussed with Dr. Arbutus Ped. She's been somewhat neutropenic and her carboplatin for further cycles will be decreased to an AUC of 5.  She'll continue with weekly labs consisting of CBC differential and C. Met and and will return in 3 weeks prior to cycle #3 with a repeat CBC differential and C. Met.   Laural Benes, Rayjon Wery E, PA-C   All questions were answered. The patient knows to call the clinic with any problems, questions or concerns. We can certainly see the patient much sooner if necessary.

## 2012-04-06 ENCOUNTER — Ambulatory Visit: Payer: Medicare Other

## 2012-04-07 ENCOUNTER — Ambulatory Visit: Payer: Medicare Other

## 2012-04-07 ENCOUNTER — Ambulatory Visit: Payer: Medicare Other | Admitting: Nutrition

## 2012-04-07 ENCOUNTER — Other Ambulatory Visit (HOSPITAL_BASED_OUTPATIENT_CLINIC_OR_DEPARTMENT_OTHER): Payer: Medicare Other

## 2012-04-07 DIAGNOSIS — C3490 Malignant neoplasm of unspecified part of unspecified bronchus or lung: Secondary | ICD-10-CM | POA: Diagnosis not present

## 2012-04-07 LAB — CBC WITH DIFFERENTIAL/PLATELET
BASO%: 0.4 % (ref 0.0–2.0)
EOS%: 3.4 % (ref 0.0–7.0)
MCH: 27.3 pg (ref 25.1–34.0)
MCHC: 34.2 g/dL (ref 31.5–36.0)
NEUT%: 32.9 % — ABNORMAL LOW (ref 38.4–76.8)
RBC: 3.96 10*6/uL (ref 3.70–5.45)
RDW: 14 % (ref 11.2–14.5)
lymph#: 1.3 10*3/uL (ref 0.9–3.3)
nRBC: 0 % (ref 0–0)

## 2012-04-07 LAB — COMPREHENSIVE METABOLIC PANEL
AST: 18 U/L (ref 0–37)
Albumin: 3.9 g/dL (ref 3.5–5.2)
Alkaline Phosphatase: 66 U/L (ref 39–117)
BUN: 12 mg/dL (ref 6–23)
Potassium: 3.9 mEq/L (ref 3.5–5.3)
Sodium: 138 mEq/L (ref 135–145)

## 2012-04-07 NOTE — Progress Notes (Signed)
Destiny Harrison chemotherapy was cancelled today.  However, she and her son did present to nutrition followup.  She is having a lot of pain today and states that she did not know that she could take her pain medication along with her chemotherapy.  Her pain is out of control today, but she has a prescription for pain medications and agrees that she will take these when she gets home.  Her weight has decreased to 131.6 pounds from 133.5 pounds on June 11th and 136.3 pounds May 21st.  She is drinking Boost Plus t.i.d. and states she does try to eat a little bit.  She is interested in receiving some additional coupons today.  NUTRITION DIAGNOSIS:  Food and nutrition-related knowledge deficit continues.  INTERVENTION:  I have recommended the patient increase Boost Plus to q.i.d.  I have reviewed the importance of high-protein, high-calorie foods to minimize further weight loss.  I provided additional coupons for the patient today, who states she does have plenty of Boost at home to drink in the next few days.  MONITORING/EVALUATION/GOALS:  The patient has been unable to increase her oral intake to minimize weight loss.  She will continue to work to consume adequate calories and protein to minimize further weight loss.  NEXT VISIT:  Tuesday, July 2nd during chemotherapy.    ______________________________ Zenovia Jarred, RD, LDN Clinical Nutrition Specialist BN/MEDQ  D:  04/07/2012  T:  04/07/2012  Job:  [id number]

## 2012-04-07 NOTE — Progress Notes (Unsigned)
Destiny Harrison neutraphil count on 04/07/2012 is 800. Am waiting a response from Dr. Arbutus Ped regarding the status of her treatment.

## 2012-04-14 ENCOUNTER — Other Ambulatory Visit (HOSPITAL_BASED_OUTPATIENT_CLINIC_OR_DEPARTMENT_OTHER): Payer: Medicare Other

## 2012-04-14 ENCOUNTER — Ambulatory Visit: Payer: Medicare Other

## 2012-04-14 ENCOUNTER — Telehealth: Payer: Self-pay | Admitting: Medical Oncology

## 2012-04-14 DIAGNOSIS — C3490 Malignant neoplasm of unspecified part of unspecified bronchus or lung: Secondary | ICD-10-CM | POA: Diagnosis not present

## 2012-04-14 LAB — CBC WITH DIFFERENTIAL/PLATELET
Basophils Absolute: 0 10*3/uL (ref 0.0–0.1)
EOS%: 2.1 % (ref 0.0–7.0)
HGB: 10.6 g/dL — ABNORMAL LOW (ref 11.6–15.9)
MCH: 27.5 pg (ref 25.1–34.0)
MCV: 80.3 fL (ref 79.5–101.0)
MONO%: 22.8 % — ABNORMAL HIGH (ref 0.0–14.0)
NEUT%: 13.7 % — ABNORMAL LOW (ref 38.4–76.8)
RDW: 14.7 % — ABNORMAL HIGH (ref 11.2–14.5)

## 2012-04-14 NOTE — Progress Notes (Signed)
Per Tiana Loft, pt cannot be treated with CBC results today.  Spoke to pt in lobby, neutropenic pxns given and she is to call if she develops any fever.  SLJ

## 2012-04-14 NOTE — Telephone Encounter (Signed)
Called pt -she is in lobby waiting for treatment. Kathlee Nations, RN  reviewed her cbc and neutropenic. Pt denies fever or other signs of infection. She does have a persistent hacking cough -nonproductive. She verbalizes when to call MD.

## 2012-04-21 ENCOUNTER — Ambulatory Visit (HOSPITAL_BASED_OUTPATIENT_CLINIC_OR_DEPARTMENT_OTHER): Payer: Medicare Other | Admitting: Physician Assistant

## 2012-04-21 ENCOUNTER — Telehealth: Payer: Self-pay | Admitting: Internal Medicine

## 2012-04-21 ENCOUNTER — Ambulatory Visit: Payer: Medicare Other | Admitting: Nutrition

## 2012-04-21 ENCOUNTER — Ambulatory Visit (HOSPITAL_BASED_OUTPATIENT_CLINIC_OR_DEPARTMENT_OTHER): Payer: Medicare Other

## 2012-04-21 ENCOUNTER — Encounter: Payer: Self-pay | Admitting: Physician Assistant

## 2012-04-21 ENCOUNTER — Other Ambulatory Visit (HOSPITAL_BASED_OUTPATIENT_CLINIC_OR_DEPARTMENT_OTHER): Payer: Medicare Other | Admitting: Lab

## 2012-04-21 VITALS — BP 135/78 | HR 130 | Ht 64.5 in | Wt 135.3 lb

## 2012-04-21 DIAGNOSIS — C3410 Malignant neoplasm of upper lobe, unspecified bronchus or lung: Secondary | ICD-10-CM | POA: Diagnosis not present

## 2012-04-21 DIAGNOSIS — Z5111 Encounter for antineoplastic chemotherapy: Secondary | ICD-10-CM

## 2012-04-21 DIAGNOSIS — C3490 Malignant neoplasm of unspecified part of unspecified bronchus or lung: Secondary | ICD-10-CM

## 2012-04-21 LAB — COMPREHENSIVE METABOLIC PANEL
ALT: 12 U/L (ref 0–35)
AST: 19 U/L (ref 0–37)
Creatinine, Ser: 0.7 mg/dL (ref 0.50–1.10)
Total Bilirubin: 0.5 mg/dL (ref 0.3–1.2)

## 2012-04-21 LAB — CBC WITH DIFFERENTIAL/PLATELET
BASO%: 0.3 % (ref 0.0–2.0)
LYMPH%: 39.1 % (ref 14.0–49.7)
MCH: 27.3 pg (ref 25.1–34.0)
MCHC: 34.1 g/dL (ref 31.5–36.0)
MCV: 80.3 fL (ref 79.5–101.0)
MONO%: 20.7 % — ABNORMAL HIGH (ref 0.0–14.0)
Platelets: 189 10*3/uL (ref 145–400)
RBC: 3.95 10*6/uL (ref 3.70–5.45)
nRBC: 0 % (ref 0–0)

## 2012-04-21 MED ORDER — SODIUM CHLORIDE 0.9 % IJ SOLN
10.0000 mL | INTRAMUSCULAR | Status: DC | PRN
  Administered 2012-04-21: 10 mL
  Filled 2012-04-21: qty 10

## 2012-04-21 MED ORDER — HEPARIN SOD (PORK) LOCK FLUSH 100 UNIT/ML IV SOLN
500.0000 [IU] | Freq: Once | INTRAVENOUS | Status: AC | PRN
  Administered 2012-04-21: 500 [IU]
  Filled 2012-04-21: qty 5

## 2012-04-21 MED ORDER — SODIUM CHLORIDE 0.9 % IV SOLN
Freq: Once | INTRAVENOUS | Status: AC
  Administered 2012-04-21: 11:00:00 via INTRAVENOUS

## 2012-04-21 MED ORDER — DEXAMETHASONE SODIUM PHOSPHATE 4 MG/ML IJ SOLN
20.0000 mg | Freq: Once | INTRAMUSCULAR | Status: AC
  Administered 2012-04-21: 20 mg via INTRAVENOUS

## 2012-04-21 MED ORDER — SODIUM CHLORIDE 0.9 % IV SOLN
427.0500 mg | Freq: Once | INTRAVENOUS | Status: AC
  Administered 2012-04-21: 430 mg via INTRAVENOUS
  Filled 2012-04-21: qty 43

## 2012-04-21 MED ORDER — PACLITAXEL PROTEIN-BOUND CHEMO INJECTION 100 MG
90.0000 mg/m2 | Freq: Once | INTRAVENOUS | Status: AC
  Administered 2012-04-21: 150 mg via INTRAVENOUS
  Filled 2012-04-21: qty 30

## 2012-04-21 MED ORDER — ONDANSETRON 16 MG/50ML IVPB (CHCC)
16.0000 mg | Freq: Once | INTRAVENOUS | Status: AC
  Administered 2012-04-21: 16 mg via INTRAVENOUS

## 2012-04-21 NOTE — Progress Notes (Signed)
Destiny Harrison reports that she has been eating more often.  She denies nausea, diarrhea, or constipation.  She states her weight has improved and was documented as 135.3 pounds on July 2nd which is up from 131.6 pounds June 18th.  She is drinking Boost Plus q.i.d. which is what she attributes her weight gain to.  She has no other concerns today.  NUTRITION DIAGNOSIS:  Food and nutrition-related knowledge deficit has improved.  INTERVENTION:  I have recommended the patient continue Boost Plus q.i.d. I have provided her with some additional coupons for Ensure Plus or Boost Plus and I have answered her questions.  MONITORING, EVALUATION, AND GOALS:  The patient has been able to increase her oral intake to promote weight gain.  She will continue to work to increase oral intake and consume supplements q.i.d. to promote weight maintenance.  NEXT VISIT:  Tuesday, July 23rd.    ______________________________ Zenovia Jarred, RD, LDN Clinical Nutrition Specialist BN/MEDQ  D:  04/21/2012  T:  04/21/2012  Job:  [id number]

## 2012-04-21 NOTE — Telephone Encounter (Signed)
appts made and printed for pt aom °

## 2012-04-21 NOTE — Patient Instructions (Addendum)
[facility] Discharge Instructions for Patients Receiving Chemotherapy  Today you received the following chemotherapy agents: Abraxene and Carboplatin.  To help prevent nausea and vomiting after your treatment, we encourage you to take your nausea medication;  Zofran (ondansetron) and Compazine (prochloraperzine) as directed for nausea.  If you develop nausea and vomiting that is not controlled by your nausea medication, call the clinic. If it is after clinic hours your family physician or the after hours number for the clinic or go to the Emergency Department.   BELOW ARE SYMPTOMS THAT SHOULD BE REPORTED IMMEDIATELY:  *FEVER GREATER THAN 100.5 F  *CHILLS WITH OR WITHOUT FEVER  NAUSEA AND VOMITING THAT IS NOT CONTROLLED WITH YOUR NAUSEA MEDICATION  *UNUSUAL SHORTNESS OF BREATH  *UNUSUAL BRUISING OR BLEEDING  TENDERNESS IN MOUTH AND THROAT WITH OR WITHOUT PRESENCE OF ULCERS  *URINARY PROBLEMS  *BOWEL PROBLEMS  UNUSUAL RASH Items with * indicate a potential emergency and should be followed up as soon as possible.   Feel free to call the clinic you have any questions or concerns. The clinic phone number is [phone].   I have been informed and understand all the instructions given to me. I know to contact the clinic, my physician, or go to the Emergency Department if any problems should occur. I do not have any questions at this time, but understand that I may call the clinic during office hours   should I have any questions or need assistance in obtaining follow up care.    __________________________________________  _____________  __________ Signature of Patient or Authorized Representative            Date                   Time    __________________________________________ Nurse's Signature

## 2012-04-21 NOTE — Progress Notes (Signed)
[facility] [phone]   [phone]  OFFICE PROGRESS NOTE  [name], MD [address] [place] [address]  DIAGNOSIS: Metastatic non-small cell lung cancer, squamous cell carcinoma diagnosed in March of 2013.   PRIOR THERAPY: Status post palliative radiotherapy to the left lung mass under the care of Dr. Michell Heinrich completed on 03/06/2012.   CURRENT THERAPY: Systemic chemotherapy with carboplatin for AUC of 6 on day 1 and Abraxane 100 mg/M2 on days 1, 8 and 15 every 3 weeks. Status post 2 cycles From cycle 3 forward AUC will be decreased to 4.5 given on day 1 and the Abraxane will be decreased to 90 mg per meter squared on days 1, 8 and 15 every 3 weeks  INTERVAL HISTORY: Denee Boeder 71 y.o. female returns to the clinic today for followup visit accompanied by her son. She tolerated her first cycle of chemotherapy relatively well with the exception of some nausea. She reports a dry hacking cough. She continues on nebulizer treatments at home. She denies any fever or chills. She reports that she is eating a little better and is managing to get in 4 Ensure supplements daily. She voiced no other specific complaints today. She denied having any significant fever or chills. She has no chest pain or shortness breath except with exertion.   MEDICAL HISTORY: Past Medical History  Diagnosis Date  . COPD (chronic obstructive pulmonary disease)   . Cancer     ALLERGIES:   has no known allergies.  MEDICATIONS:  Current Outpatient Prescriptions  Medication Sig Dispense Refill  . albuterol (PROVENTIL HFA;VENTOLIN HFA) 108 (90 BASE) MCG/ACT inhaler Inhale 2 puffs into the lungs every 6 (six) hours as needed. WHEEZING AND SHORTNESS OF BREATH      . albuterol (PROVENTIL) (2.5 MG/3ML) 0.083% nebulizer solution Take 3 mLs (2.5 mg total) by nebulization every 4 (four) hours as needed for wheezing.  75 mL  12  . calcium-vitamin D (OSCAL WITH D) 500-200  MG-UNIT per tablet Take 1 tablet by mouth daily.      . folic acid (FOLVITE) 400 MCG tablet Take 400 mcg by mouth daily.      Marland Kitchen HYDROcodone-acetaminophen (VICODIN) 5-500 MG per tablet       . ipratropium (ATROVENT) 0.02 % nebulizer solution Take 2.5 mLs (500 mcg total) by nebulization 4 (four) times daily.  75 mL  12  . lidocaine-prilocaine (EMLA) cream Apply topically as needed.  30 g  0  . ondansetron (ZOFRAN) 8 MG tablet       . predniSONE (DELTASONE) 5 MG tablet Take 5 mg by mouth daily.        . prochlorperazine (COMPAZINE) 10 MG tablet Take 10 mg by mouth every 6 (six) hours as needed. NAUSEA      . simvastatin (ZOCOR) 5 MG tablet Take 5 mg by mouth at bedtime.      Marland Kitchen SPIRIVA HANDIHALER 18 MCG inhalation capsule       . sucralfate (CARAFATE) 1 G tablet Take 1 tablet (1 g total) by mouth 4 (four) times daily. Dissolve 1 tablet in water prior to ingesting 4 times per day.  60 tablet  1   No current facility-administered medications for this visit.   Facility-Administered Medications Ordered in Other Visits  Medication Dose Route Frequency Provider Last Rate Last Dose  . 0.9 %  sodium chloride infusion   Intravenous Once Si Gaul, MD 20 mL/hr at 04/21/12 1129    .  CARBOplatin (PARAPLATIN) 430 mg in sodium chloride 0.9 % 250 mL chemo infusion  430 mg Intravenous Once Si Gaul, MD      . dexamethasone (DECADRON) injection 20 mg  20 mg Intravenous Once Si Gaul, MD   20 mg at 04/21/12 1130  . heparin lock flush 100 unit/mL  500 Units Intracatheter Once PRN Si Gaul, MD      . ondansetron (ZOFRAN) IVPB 16 mg  16 mg Intravenous Once Si Gaul, MD   16 mg at 04/21/12 1130  . PACLitaxel-protein bound (ABRAXANE) chemo infusion 150 mg  90 mg/m2 (Treatment Plan Actual) Intravenous Once Si Gaul, MD      . sodium chloride 0.9 % injection 10 mL  10 mL Intracatheter PRN Si Gaul, MD        SURGICAL HISTORY:  Past Surgical History  Procedure Date  .  Appendex 1962  . Video bronchoscopy 01/28/2012    Procedure: VIDEO BRONCHOSCOPY WITHOUT FLUORO;  Surgeon: Kalman Shan, MD;  Location: [facility] ENDOSCOPY;  Service: Endoscopy;  Laterality: Bilateral;  . Surgery on right wrist     REVIEW OF SYSTEMS:  A comprehensive review of systems was negative except for: Gastrointestinal: positive for nausea   PHYSICAL EXAMINATION: General appearance: alert, cooperative and no distress Neck: no adenopathy Lymph nodes: Cervical, supraclavicular, and axillary nodes normal. Resp: clear to auscultation bilaterally Cardio: regular rate and rhythm, S1, S2 normal, no murmur, click, rub or gallop GI: soft, non-tender; bowel sounds normal; no masses,  no organomegaly Extremities: extremities normal, atraumatic, no cyanosis or edema Neurologic: Alert and oriented X 3, normal strength and tone. Normal symmetric reflexes. Normal coordination and gait  ECOG PERFORMANCE STATUS: 1 - Symptomatic but completely ambulatory  Blood pressure 135/78, pulse 130, height 5' 4.5" (1.638 m), weight 135 lb 4.8 oz (61.372 kg), peak flow 98 L/min.  LABORATORY DATA: Lab Results  Component Value Date   WBC 3.4* 04/21/2012   HGB 10.8* 04/21/2012   HCT 31.7* 04/21/2012   MCV 80.3 04/21/2012   PLT 189 04/21/2012      Chemistry      Component Value Date/Time   NA 138 04/07/2012 0810   K 3.9 04/07/2012 0810   CL 102 04/07/2012 0810   CO2 26 04/07/2012 0810   BUN 12 04/07/2012 0810   CREATININE 0.71 04/07/2012 0810      Component Value Date/Time   CALCIUM 9.0 04/07/2012 0810   ALKPHOS 66 04/07/2012 0810   AST 18 04/07/2012 0810   ALT 13 04/07/2012 0810   BILITOT 0.5 04/07/2012 0810       RADIOGRAPHIC STUDIES: Mr [name] ZO Contrast  Mar 16, 2012  *RADIOLOGY REPORT*  Clinical Data: The lung cancer.  MRI HEAD WITHOUT AND WITH CONTRAST  Technique:  Multiplanar, multiecho pulse sequences of the brain and surrounding structures were obtained according to standard protocol without and with  intravenous contrast  Contrast: 10mL MULTIHANCE GADOBENATE DIMEGLUMINE 529 MG/ML IV SOLN  Comparison: None available.  Findings: No acute infarct, hemorrhage, or mass lesion is present. The postcontrast images demonstrate no enhancement to suggest metastatic disease of the brain and meninges.  There is a focal 6 mm area of enhancement within the right parietal calvarium.  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  There is no other evidence for metastatic disease of the calvarium.  Degenerative changes are noted in the upper cervical spine.  Flow is present in the major intracranial arteries.  The globes and  orbits are intact.  The paranasal sinuses and mastoid air cells are clear.  The ventricles are of normal size.  No significant extra-axial fluid collection is present.  IMPRESSION:  1.  No evidence for metastatic disease of the brain or meninges. 2.  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  Metastatic disease is considered less likely in the absence of other bony metastases.  Original Report Authenticated By: Jamesetta Orleans. MATTERN, M.D.   Nm Pet Image Restag (ps) Skull Base To Thigh  02/20/2012  *RADIOLOGY REPORT*  Clinical Data: Subsequent treatment strategy for lung cancer.  NUCLEAR MEDICINE PET SKULL BASE TO THIGH  Fasting Blood Glucose:  87  Technique:  18.9 mCi F-18 FDG was injected intravenously. CT data was obtained and used for attenuation correction and anatomic localization only.  (This was not acquired as a diagnostic CT examination.) Additional exam technical data entered on technologist worksheet.  Comparison:  CT chest 01/14/2012 and PET CT 11/07/2011.  Findings:  Neck: No hypermetabolic lymph nodes in the neck. CT images show no acute findings.  Chest:  A 5 mm lymph node deep to the right clavicular head has an S U V max of 3.8.  A right paratracheal lymph node measures 9 mm and has an S U V max of  12.8.  There is AP window and subcarinal adenopathy with an S U V max of 14.7 in the AP window (9 mm).  Endobronchial lesion in the left upper lobe has an S U V max of 16.7.  No additional areas of abnormal hypermetabolism in the chest.  CT images show no acute findings.  There is extensive atherosclerotic calcification of the arterial vasculature.  No pericardial or pleural effusion.  Mild postobstructive changes in the left upper lobe, as before.  Abdomen/Pelvis:  No abnormal hypermetabolic activity within the liver, pancreas, adrenal glands, or spleen.  No hypermetabolic lymph nodes in the abdomen or pelvis. CT images show no acute findings.  No free fluid.  [place]:  There is a focal area of hypermetabolism associated with the lateral left eleventh rib, with a small amount of associated soft tissue and cortical erosion (CT image 120). Findings appear new when compared 11/07/2011. 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  IMPRESSION:  Left upper lobe endobronchial mass with hypermetabolic adenopathy extending to the contralateral mediastinum and deep to the right clavicular head, all consistent with primary bronchogenic carcinoma.  Left eleventh rib lesion is highly worrisome for a metastatic lesion.  Original Report Authenticated By: Reyes Ivan, M.D.    ASSESSMENT/PLAN: This is a very pleasant 71 years old [place] American female with metastatic non-small cell lung cancer status post palliative radiotherapy to the left lung mass and currently undergoing systemic chemotherapy with carboplatin and Abraxane status post 2 cycles. Patient was discussed with Dr. Arbutus Ped who also reviewed her counts. In view of her ANC of 1.2, will further reduce her chemotherapy as follows: Her her carboplatin will be reduced to an AUC of 4.5 given on day 1 and her Abraxane will be reduced to 90 mg per meter squared given on days 1, 8 and 15 every 3 weeks. She'll followup with Dr. Arbutus Ped in 3 weeks with  repeat CBC differential C. met and CT of the chest abdomen and pelvis with contrast to reevaluate her disease.    Laural Benes, Melat Wrisley E, PA-C   All questions were answered. The patient knows to call the clinic  with any problems, questions or concerns. We can certainly see the patient much sooner if necessary.

## 2012-04-28 ENCOUNTER — Other Ambulatory Visit (HOSPITAL_BASED_OUTPATIENT_CLINIC_OR_DEPARTMENT_OTHER): Payer: Medicare Other | Admitting: Lab

## 2012-04-28 ENCOUNTER — Telehealth: Payer: Self-pay | Admitting: Medical Oncology

## 2012-04-28 ENCOUNTER — Ambulatory Visit: Payer: Medicare Other

## 2012-04-28 DIAGNOSIS — C3490 Malignant neoplasm of unspecified part of unspecified bronchus or lung: Secondary | ICD-10-CM | POA: Diagnosis not present

## 2012-04-28 DIAGNOSIS — C3410 Malignant neoplasm of upper lobe, unspecified bronchus or lung: Secondary | ICD-10-CM

## 2012-04-28 LAB — CBC WITH DIFFERENTIAL/PLATELET
BASO%: 0.5 % (ref 0.0–2.0)
MCHC: 34.8 g/dL (ref 31.5–36.0)
MONO#: 0.1 10*3/uL (ref 0.1–0.9)
RBC: 3.74 10*6/uL (ref 3.70–5.45)
RDW: 14.8 % — ABNORMAL HIGH (ref 11.2–14.5)
WBC: 1.9 10*3/uL — ABNORMAL LOW (ref 3.9–10.3)
lymph#: 1.1 10*3/uL (ref 0.9–3.3)

## 2012-04-28 LAB — COMPREHENSIVE METABOLIC PANEL
ALT: 12 U/L (ref 0–35)
CO2: 28 mEq/L (ref 19–32)
Calcium: 8.9 mg/dL (ref 8.4–10.5)
Chloride: 104 mEq/L (ref 96–112)
Sodium: 138 mEq/L (ref 135–145)
Total Bilirubin: 0.6 mg/dL (ref 0.3–1.2)
Total Protein: 6.5 g/dL (ref 6.0–8.3)

## 2012-04-28 NOTE — Progress Notes (Signed)
Per Dr. Shirline Frees do not treat with current lab values.

## 2012-04-28 NOTE — Telephone Encounter (Signed)
Notified patient of Neutrapenic  Precautions and when to call - patient  voices understanding.

## 2012-05-05 ENCOUNTER — Other Ambulatory Visit: Payer: Self-pay | Admitting: Internal Medicine

## 2012-05-05 ENCOUNTER — Other Ambulatory Visit: Payer: Self-pay | Admitting: *Deleted

## 2012-05-05 ENCOUNTER — Encounter: Payer: Self-pay | Admitting: Internal Medicine

## 2012-05-05 ENCOUNTER — Ambulatory Visit (HOSPITAL_BASED_OUTPATIENT_CLINIC_OR_DEPARTMENT_OTHER): Payer: Medicare Other

## 2012-05-05 ENCOUNTER — Other Ambulatory Visit: Payer: Self-pay | Admitting: Medical Oncology

## 2012-05-05 ENCOUNTER — Other Ambulatory Visit (HOSPITAL_BASED_OUTPATIENT_CLINIC_OR_DEPARTMENT_OTHER): Payer: Medicare Other | Admitting: Lab

## 2012-05-05 DIAGNOSIS — C3490 Malignant neoplasm of unspecified part of unspecified bronchus or lung: Secondary | ICD-10-CM

## 2012-05-05 DIAGNOSIS — D649 Anemia, unspecified: Secondary | ICD-10-CM

## 2012-05-05 DIAGNOSIS — D709 Neutropenia, unspecified: Secondary | ICD-10-CM

## 2012-05-05 DIAGNOSIS — D702 Other drug-induced agranulocytosis: Secondary | ICD-10-CM

## 2012-05-05 HISTORY — DX: Other drug-induced agranulocytosis: D70.2

## 2012-05-05 LAB — CBC WITH DIFFERENTIAL/PLATELET
BASO%: 1.9 % (ref 0.0–2.0)
EOS%: 0.5 % (ref 0.0–7.0)
LYMPH%: 55.6 % — ABNORMAL HIGH (ref 14.0–49.7)
MCHC: 34.1 g/dL (ref 31.5–36.0)
MONO#: 0.5 10*3/uL (ref 0.1–0.9)
Platelets: 267 10*3/uL (ref 145–400)
RBC: 3.71 10*6/uL (ref 3.70–5.45)
WBC: 2.2 10*3/uL — ABNORMAL LOW (ref 3.9–10.3)
nRBC: 0 % (ref 0–0)

## 2012-05-05 MED ORDER — FILGRASTIM 300 MCG/0.5ML IJ SOLN
300.0000 ug | Freq: Once | INTRAMUSCULAR | Status: AC
  Administered 2012-05-05: 300 ug via SUBCUTANEOUS
  Filled 2012-05-05: qty 0.5

## 2012-05-05 NOTE — Patient Instructions (Addendum)
[facility] Discharge Instructions for Patients Receiving Chemotherapy   To help prevent nausea and vomiting after your treatment, we encourage you to take your nausea medication   Take it as often as prescribed.   If you develop nausea and vomiting that is not controlled by your nausea medication, call the clinic. If it is after clinic hours your family physician or the after hours number for the clinic or go to the Emergency Department.   BELOW ARE SYMPTOMS THAT SHOULD BE REPORTED IMMEDIATELY:  *FEVER GREATER THAN 100.5 F  *CHILLS WITH OR WITHOUT FEVER  NAUSEA AND VOMITING THAT IS NOT CONTROLLED WITH YOUR NAUSEA MEDICATION  *UNUSUAL SHORTNESS OF BREATH  *UNUSUAL BRUISING OR BLEEDING  TENDERNESS IN MOUTH AND THROAT WITH OR WITHOUT PRESENCE OF ULCERS  *URINARY PROBLEMS  *BOWEL PROBLEMS  UNUSUAL RASH Items with * indicate a potential emergency and should be followed up as soon as possible.  If this is your first treatment one of the nurses will contact you 24 hours after your treatment. Please let the nurse know about any problems that you may have experienced. Feel free to call the clinic you have any questions or concerns. The clinic phone number is [phone].   I have been informed and understand all the instructions given to me. I know to contact the clinic, my physician, or go to the Emergency Department if any problems should occur. I do not have any questions at this time, but understand that I may call the clinic during office hours   should I have any questions or need assistance in obtaining follow up care.    __________________________________________  _____________  __________ Signature of Patient or Authorized Representative            Date                   Time    __________________________________________ Nurse's Signature

## 2012-05-05 NOTE — Progress Notes (Signed)
Neutropenic , VSS pt feels okay. Per Dr Arbutus Ped Neupogen ordered.Notified patient of Neutrapenic  precautions- teach back done.

## 2012-05-06 ENCOUNTER — Ambulatory Visit (HOSPITAL_BASED_OUTPATIENT_CLINIC_OR_DEPARTMENT_OTHER): Payer: Medicare Other

## 2012-05-06 ENCOUNTER — Telehealth: Payer: Self-pay | Admitting: Medical Oncology

## 2012-05-06 ENCOUNTER — Other Ambulatory Visit: Payer: Self-pay | Admitting: Medical Oncology

## 2012-05-06 VITALS — BP 136/70 | HR 107 | Temp 98.0°F

## 2012-05-06 DIAGNOSIS — D709 Neutropenia, unspecified: Secondary | ICD-10-CM

## 2012-05-06 DIAGNOSIS — D702 Other drug-induced agranulocytosis: Secondary | ICD-10-CM

## 2012-05-06 DIAGNOSIS — C3410 Malignant neoplasm of upper lobe, unspecified bronchus or lung: Secondary | ICD-10-CM | POA: Diagnosis not present

## 2012-05-06 MED ORDER — FILGRASTIM 300 MCG/0.5ML IJ SOLN: 300.0000 ug | Freq: Once | INTRAMUSCULAR | Status: DC

## 2012-05-06 MED ORDER — FILGRASTIM 300 MCG/0.5ML IJ SOLN
300.0000 ug | Freq: Once | INTRAMUSCULAR | Status: AC
  Administered 2012-05-06: 300 ug via SUBCUTANEOUS
  Filled 2012-05-06: qty 0.5

## 2012-05-06 NOTE — Telephone Encounter (Signed)
Pt notified to come in today for neupogen at 1500 and tomorrow. Order entered and sched request sent

## 2012-05-07 ENCOUNTER — Ambulatory Visit (HOSPITAL_COMMUNITY)
Admission: RE | Admit: 2012-05-07 | Discharge: 2012-05-07 | Disposition: A | Discharge status: Home / Self Care | Payer: Medicare Other | Source: Ambulatory Visit | Attending: Physician Assistant | Admitting: Physician Assistant

## 2012-05-07 ENCOUNTER — Ambulatory Visit (HOSPITAL_BASED_OUTPATIENT_CLINIC_OR_DEPARTMENT_OTHER): Payer: Medicare Other

## 2012-05-07 VITALS — BP 139/69 | HR 124 | Temp 98.2°F

## 2012-05-07 DIAGNOSIS — Z923 Personal history of irradiation: Secondary | ICD-10-CM | POA: Insufficient documentation

## 2012-05-07 DIAGNOSIS — M889 Osteitis deformans of unspecified bone: Secondary | ICD-10-CM | POA: Insufficient documentation

## 2012-05-07 DIAGNOSIS — C3490 Malignant neoplasm of unspecified part of unspecified bronchus or lung: Secondary | ICD-10-CM | POA: Insufficient documentation

## 2012-05-07 DIAGNOSIS — D702 Other drug-induced agranulocytosis: Secondary | ICD-10-CM

## 2012-05-07 DIAGNOSIS — C771 Secondary and unspecified malignant neoplasm of intrathoracic lymph nodes: Secondary | ICD-10-CM | POA: Diagnosis not present

## 2012-05-07 DIAGNOSIS — C3410 Malignant neoplasm of upper lobe, unspecified bronchus or lung: Secondary | ICD-10-CM | POA: Diagnosis not present

## 2012-05-07 DIAGNOSIS — Z79899 Other long term (current) drug therapy: Secondary | ICD-10-CM | POA: Diagnosis not present

## 2012-05-07 MED ORDER — IOHEXOL 300 MG/ML  SOLN
100.0000 mL | Freq: Once | INTRAMUSCULAR | Status: AC | PRN
  Administered 2012-05-07: 100 mL via INTRAVENOUS

## 2012-05-07 MED ORDER — FILGRASTIM 300 MCG/0.5ML IJ SOLN
300.0000 ug | Freq: Once | INTRAMUSCULAR | Status: AC
  Administered 2012-05-07: 300 ug via SUBCUTANEOUS
  Filled 2012-05-07: qty 0.5

## 2012-05-12 ENCOUNTER — Other Ambulatory Visit: Payer: Self-pay | Admitting: Medical Oncology

## 2012-05-12 ENCOUNTER — Ambulatory Visit: Payer: Medicare Other

## 2012-05-12 ENCOUNTER — Ambulatory Visit (HOSPITAL_BASED_OUTPATIENT_CLINIC_OR_DEPARTMENT_OTHER): Payer: Medicare Other | Admitting: Internal Medicine

## 2012-05-12 ENCOUNTER — Other Ambulatory Visit (HOSPITAL_BASED_OUTPATIENT_CLINIC_OR_DEPARTMENT_OTHER): Payer: Medicare Other | Admitting: Lab

## 2012-05-12 ENCOUNTER — Ambulatory Visit: Payer: Medicare Other | Admitting: Nutrition

## 2012-05-12 VITALS — BP 131/76 | HR 96 | Temp 97.4°F | Ht 64.0 in | Wt 133.1 lb

## 2012-05-12 DIAGNOSIS — R52 Pain, unspecified: Secondary | ICD-10-CM

## 2012-05-12 DIAGNOSIS — C3490 Malignant neoplasm of unspecified part of unspecified bronchus or lung: Secondary | ICD-10-CM

## 2012-05-12 LAB — CBC WITH DIFFERENTIAL/PLATELET
BASO%: 0.3 % (ref 0.0–2.0)
Basophils Absolute: 0 10*3/uL (ref 0.0–0.1)
EOS%: 2.2 % (ref 0.0–7.0)
HGB: 10.7 g/dL — ABNORMAL LOW (ref 11.6–15.9)
MCH: 27.5 pg (ref 25.1–34.0)
MCHC: 34.3 g/dL (ref 31.5–36.0)
MCV: 80.2 fL (ref 79.5–101.0)
MONO%: 25.2 % — ABNORMAL HIGH (ref 0.0–14.0)
NEUT%: 34.1 % — ABNORMAL LOW (ref 38.4–76.8)
RDW: 16.2 % — ABNORMAL HIGH (ref 11.2–14.5)

## 2012-05-12 MED ORDER — HYDROCODONE-ACETAMINOPHEN 5-500 MG PO TABS: 1.0000 | ORAL_TABLET | ORAL | Status: DC | PRN

## 2012-05-12 NOTE — Progress Notes (Signed)
Destiny Harrison is in good spirits and reports she feels well.  She continues to consume Boost Plus 3 to 4 cans daily.  Her weight has declined slightly to 133.1 pounds on July 23rd from 135.3 pounds July 2nd. However, she continues to be within her usual body weight range.  She is incorporating different strategies to increase her oral intake.  She enjoys freezing nutrition supplements to improve their taste.  She is exploring options of using Ensure Complete.  NUTRITION DIAGNOSIS:  Food and nutrition-related knowledge deficit improved.  INTERVENTION:  I have encouraged Destiny Harrison to continue small amounts of high-calorie, high-protein foods throughout the day.  She can continue to consume oral nutrition supplements as tolerated.  I provided her with additional coupons today to purchase Ensure Complete or Boost per her preference.  I have answered her questions.  MONITORING/EVALUATION/GOALS:  The patient continues to tolerate oral intake.  Her weight is within the usual range for her.  NEXT VISIT:  There is no followup scheduled.  The patient has my contact information and agrees to call with questions or concerns.   ______________________________ Zenovia Jarred, RD, CSO, LDN Clinical Nutrition Specialist BN/MEDQ  D:  05/12/2012  T:  05/12/2012  Job:  [id number]

## 2012-05-12 NOTE — Telephone Encounter (Signed)
Gave pt appt for lab,chemo and ML on July, August, and September 2013

## 2012-05-12 NOTE — Progress Notes (Signed)
[facility] [phone]   Fax:([phone]  OFFICE PROGRESS NOTE  Destiny Carrow, MD [address] [place] [address]  DIAGNOSIS: Metastatic non-small cell lung cancer, squamous cell carcinoma diagnosed in March of 2013.   PRIOR THERAPY: Status post palliative radiotherapy to the left lung mass under the care of Dr. Michell Heinrich completed on 03/06/2012.   CURRENT THERAPY: Systemic chemotherapy with carboplatin for AUC of 6 on day 1 and Abraxane 100 mg/M2 on days 1, 8 and 15 every 3 weeks. Status post 2 cycles  From cycle 3 forward AUC will be decreased to 4.5 given on day 1 and the Abraxane will be decreased to 90 mg per meter squared on days 1, 8 and 15 every 3 weeks  INTERVAL HISTORY: Destiny Harrison 71 y.o. female returns to the clinic today for followup visit accompanied by her son. The patient is doing fine today with no specific complaints except for mild fatigue and peripheral neuropathy. She tolerated the last cycle of her chemotherapy fairly well except for significant neutropenia. She received few days of Neupogen injection. She denied having any significant chest pain, shortness of breath, cough or hemoptysis. She has no weight loss or night sweats. No significant nausea or vomiting. The patient has repeat CT scan of the chest, abdomen and pelvis performed recently and she is here today for evaluation and discussion of her scan results.  MEDICAL HISTORY: Past Medical History  Diagnosis Date  . COPD (chronic obstructive pulmonary disease)   . Cancer   . Neutropenia, drug-induced 05/05/2012  . Neutropenia, drug-induced 05/05/2012    ALLERGIES:   has no known allergies.  MEDICATIONS:  Current Outpatient Prescriptions  Medication Sig Dispense Refill  . albuterol (PROVENTIL HFA;VENTOLIN HFA) 108 (90 BASE) MCG/ACT inhaler Inhale 2 puffs into the lungs every 6 (six) hours as needed. WHEEZING AND SHORTNESS OF BREATH      . albuterol (PROVENTIL)  (2.5 MG/3ML) 0.083% nebulizer solution Take 3 mLs (2.5 mg total) by nebulization every 4 (four) hours as needed for wheezing.  75 mL  12  . calcium-vitamin D (OSCAL WITH D) 500-200 MG-UNIT per tablet Take 1 tablet by mouth daily.      . folic acid (FOLVITE) 400 MCG tablet Take 400 mcg by mouth daily.      Marland Kitchen ipratropium (ATROVENT) 0.02 % nebulizer solution Take 2.5 mLs (500 mcg total) by nebulization 4 (four) times daily.  75 mL  12  . lidocaine-prilocaine (EMLA) cream Apply topically as needed.  30 g  0  . ondansetron (ZOFRAN) 8 MG tablet       . predniSONE (DELTASONE) 5 MG tablet Take 5 mg by mouth daily.        . prochlorperazine (COMPAZINE) 10 MG tablet Take 10 mg by mouth every 6 (six) hours as needed. NAUSEA      . simvastatin (ZOCOR) 5 MG tablet Take 5 mg by mouth at bedtime.      Marland Kitchen SPIRIVA HANDIHALER 18 MCG inhalation capsule       . sucralfate (CARAFATE) 1 G tablet Take 1 tablet (1 g total) by mouth 4 (four) times daily. Dissolve 1 tablet in water prior to ingesting 4 times per day.  60 tablet  1  . HYDROcodone-acetaminophen (VICODIN) 5-500 MG per tablet Take 1 tablet by mouth every 4 (four) hours as needed for pain.  30 tablet  0    SURGICAL HISTORY:  Past Surgical History  Procedure Date  . Appendex  1962  . Video bronchoscopy 01/28/2012    Procedure: VIDEO BRONCHOSCOPY WITHOUT FLUORO;  Surgeon: Kalman Shan, MD;  Location: [facility] ENDOSCOPY;  Service: Endoscopy;  Laterality: Bilateral;  . Surgery on right wrist     REVIEW OF SYSTEMS:  A comprehensive review of systems was negative except for: Constitutional: positive for fatigue Neurological: positive for paresthesia   PHYSICAL EXAMINATION: General appearance: alert, cooperative and no distress Head: Normocephalic, without obvious abnormality, atraumatic Neck: no adenopathy Resp: clear to auscultation bilaterally Cardio: regular rate and rhythm, S1, S2 normal, no murmur, click, rub or gallop GI: soft, non-tender; bowel sounds  normal; no masses,  no organomegaly Extremities: extremities normal, atraumatic, no cyanosis or edema Neurologic: Alert and oriented X 3, normal strength and tone. Normal symmetric reflexes. Normal coordination and gait  ECOG PERFORMANCE STATUS: 1 - Symptomatic but completely ambulatory  Blood pressure 131/76, pulse 96, temperature 97.4 F (36.3 C), temperature source Oral, height 5\' 4"  (1.626 m), weight 133 lb 1.6 oz (60.374 kg).  LABORATORY DATA: Lab Results  Component Value Date   WBC 3.2* 05/12/2012   HGB 10.7* 05/12/2012   HCT 31.2* 05/12/2012   MCV 80.2 05/12/2012   PLT 183 05/12/2012      Chemistry      Component Value Date/Time   NA 138 04/28/2012 1028   K 4.2 04/28/2012 1028   CL 104 04/28/2012 1028   CO2 28 04/28/2012 1028   BUN 14 04/28/2012 1028   CREATININE 0.49* 04/28/2012 1028      Component Value Date/Time   CALCIUM 8.9 04/28/2012 1028   ALKPHOS 61 04/28/2012 1028   AST 22 04/28/2012 1028   ALT 12 04/28/2012 1028   BILITOT 0.6 04/28/2012 1028       RADIOGRAPHIC STUDIES: Ct Chest W Contrast  05/07/2012  *RADIOLOGY REPORT*  Clinical Data:  Lung cancer diagnosed November 2012, radiation therapy complete.  Chemotherapy in progress.  CT CHEST, ABDOMEN AND PELVIS WITH CONTRAST  Technique:  Multidetector CT imaging of the chest, abdomen and pelvis was performed following the standard protocol during bolus administration of intravenous contrast.  Contrast: OMNIPAQUE IOHEXOL 300 MG/ML  SOLN  Comparison:  PET CT scan 02/20/2012, CT scan 01/14/2012   CT CHEST  Findings:  No axillary or supraclavicular lymphadenopathy. Infraclavicular node on the right measures 14 mm and is increased from 5 mm on comparison PET CT scan.  High right paratracheal lymph node measures 16 mm increased from 11 mm on prior.  15 mm lower paratracheal lymph node is also increased in size.  These nodes have  central necrosis.  The left perihilar endobronchial lesion is decreased in volume with now only peribronchial  thickening remaining with no measurable mass (image 21).  The  There is fibrotic change in the upper lobes consistent with radiation therapy.  No suspicious pulmonary nodules.  IMPRESSION:  1.  Progression of mediastinal nodal metastasis with increase in size of right infraclavicular and right paratracheal lymph nodes. 2.  Interval decrease in size of left perihilar endobronchial lesion with now only peribronchial thickening remaining.   CT ABDOMEN AND PELVIS  Findings:  No focal hepatic lesion.  The gallbladder, pancreas, spleen, adrenal glands, and kidneys are normal.  The stomach, small bowel, and colon are normal.  Abdominal aorta normal caliber.  No retroperitoneal or periportal lymphadenopathy.  The bladder and uterus and ovaries are normal.  No pelvic lymphadenopathy.  Increased trabeculation within the proximal right femur which could represent Paget's disease. Degenerative changes of the spine.  No aggressive osseous lesions.  IMPRESSION:  1.  No metastasis within the abdomen or pelvis. 2.  Paget's disease of the right femur.  Original Report Authenticated By: Genevive Bi, M.D.    ASSESSMENT: This is a very pleasant 72 years old African American female with metastatic non-small cell lung cancer, squamous cell carcinoma status post palliative radiotherapy followed by 3 cycles of systemic chemotherapy with carboplatin and Abraxane. The patient has mixed response to this treatment with decrease in the size of the left perihilar lesion but progression of the mediastinal nodal metastasis.  PLAN: I discussed the scan results with the patient and her son today. I recommended for her to discontinue treatment with carboplatin and Abraxane because of the disease progression as well as the significant peripheral neuropathy. I would consider the patient for treatment with systemic chemotherapy with carboplatin for AUC of 5 on day 1 and gemcitabine 1000 mg/M2 on days 1 and 8 every 3 weeks. The patient is to  have persistent neutropenia from her last chemotherapy and I will delay the start of the first cycle of her new chemotherapy to next week. She would come back for followup visit in 4 weeks with the start of cycle #2. She was advised to call me immediately if she has any concerning symptoms in the interval.  All questions were answered. The patient knows to call the clinic with any problems, questions or concerns. We can certainly see the patient much sooner if necessary.  I spent 15 minutes counseling the patient face to face. The total time spent in the appointment was 25 minutes.

## 2012-05-13 ENCOUNTER — Other Ambulatory Visit: Payer: Self-pay | Admitting: Medical Oncology

## 2012-05-13 NOTE — Telephone Encounter (Signed)
Called in hydrocodone rx

## 2012-05-13 NOTE — Telephone Encounter (Signed)
Pharmacist clarified dose of vicodan

## 2012-05-19 ENCOUNTER — Ambulatory Visit (HOSPITAL_BASED_OUTPATIENT_CLINIC_OR_DEPARTMENT_OTHER): Payer: Medicare Other

## 2012-05-19 ENCOUNTER — Other Ambulatory Visit (HOSPITAL_BASED_OUTPATIENT_CLINIC_OR_DEPARTMENT_OTHER): Payer: Medicare Other | Admitting: Lab

## 2012-05-19 VITALS — BP 126/60 | HR 69 | Temp 97.8°F

## 2012-05-19 DIAGNOSIS — Z5111 Encounter for antineoplastic chemotherapy: Secondary | ICD-10-CM | POA: Diagnosis not present

## 2012-05-19 DIAGNOSIS — C3410 Malignant neoplasm of upper lobe, unspecified bronchus or lung: Secondary | ICD-10-CM

## 2012-05-19 DIAGNOSIS — C3490 Malignant neoplasm of unspecified part of unspecified bronchus or lung: Secondary | ICD-10-CM

## 2012-05-19 LAB — CBC WITH DIFFERENTIAL/PLATELET
Basophils Absolute: 0 10*3/uL (ref 0.0–0.1)
EOS%: 4.7 % (ref 0.0–7.0)
HCT: 30.4 % — ABNORMAL LOW (ref 34.8–46.6)
HGB: 10.5 g/dL — ABNORMAL LOW (ref 11.6–15.9)
LYMPH%: 37.8 % (ref 14.0–49.7)
MCH: 27.9 pg (ref 25.1–34.0)
MCV: 80.9 fL (ref 79.5–101.0)
MONO%: 11.7 % (ref 0.0–14.0)
NEUT%: 45.5 % (ref 38.4–76.8)
Platelets: 227 10*3/uL (ref 145–400)

## 2012-05-19 LAB — COMPREHENSIVE METABOLIC PANEL
ALT: 8 U/L (ref 0–35)
AST: 17 U/L (ref 0–37)
Alkaline Phosphatase: 63 U/L (ref 39–117)
BUN: 10 mg/dL (ref 6–23)
Creatinine, Ser: 0.74 mg/dL (ref 0.50–1.10)

## 2012-05-19 MED ORDER — SODIUM CHLORIDE 0.9 % IV SOLN
496.0000 mg | Freq: Once | INTRAVENOUS | Status: AC
  Administered 2012-05-19: 500 mg via INTRAVENOUS
  Filled 2012-05-19: qty 50

## 2012-05-19 MED ORDER — ONDANSETRON 16 MG/50ML IVPB (CHCC)
16.0000 mg | Freq: Once | INTRAVENOUS | Status: AC
  Administered 2012-05-19: 16 mg via INTRAVENOUS

## 2012-05-19 MED ORDER — DEXAMETHASONE SODIUM PHOSPHATE 4 MG/ML IJ SOLN
20.0000 mg | Freq: Once | INTRAMUSCULAR | Status: AC
  Administered 2012-05-19: 20 mg via INTRAVENOUS

## 2012-05-19 MED ORDER — SODIUM CHLORIDE 0.9 % IV SOLN
Freq: Once | INTRAVENOUS | Status: AC
  Administered 2012-05-19: 14:00:00 via INTRAVENOUS

## 2012-05-19 MED ORDER — SODIUM CHLORIDE 0.9 % IJ SOLN
10.0000 mL | INTRAMUSCULAR | Status: DC | PRN
  Administered 2012-05-19: 10 mL
  Filled 2012-05-19: qty 10

## 2012-05-19 MED ORDER — HEPARIN SOD (PORK) LOCK FLUSH 100 UNIT/ML IV SOLN
500.0000 [IU] | Freq: Once | INTRAVENOUS | Status: AC | PRN
  Administered 2012-05-19: 500 [IU]
  Filled 2012-05-19: qty 5

## 2012-05-19 MED ORDER — SODIUM CHLORIDE 0.9 % IV SOLN
1000.0000 mg/m2 | Freq: Once | INTRAVENOUS | Status: AC
  Administered 2012-05-19: 1634 mg via INTRAVENOUS
  Filled 2012-05-19: qty 43

## 2012-05-19 NOTE — Patient Instructions (Addendum)
[facility] Discharge Instructions for Patients Receiving Chemotherapy  Today you received the following chemotherapy agents Gemzar and Carboplatin  To help prevent nausea and vomiting after your treatment, we encourage you to take your nausea medication Begin taking it at 7 pm and take it as often as prescribed for the next 24 to 72 hours.   If you develop nausea and vomiting that is not controlled by your nausea medication, call the clinic. If it is after clinic hours your family physician or the after hours number for the clinic or go to the Emergency Department.   BELOW ARE SYMPTOMS THAT SHOULD BE REPORTED IMMEDIATELY:  *FEVER GREATER THAN 100.5 F  *CHILLS WITH OR WITHOUT FEVER  NAUSEA AND VOMITING THAT IS NOT CONTROLLED WITH YOUR NAUSEA MEDICATION  *UNUSUAL SHORTNESS OF BREATH  *UNUSUAL BRUISING OR BLEEDING  TENDERNESS IN MOUTH AND THROAT WITH OR WITHOUT PRESENCE OF ULCERS  *URINARY PROBLEMS  *BOWEL PROBLEMS  UNUSUAL RASH Items with * indicate a potential emergency and should be followed up as soon as possible.  One of the nurses will contact you 24 hours after your treatment. Please let the nurse know about any problems that you may have experienced. Feel free to call the clinic you have any questions or concerns. The clinic phone number is [phone].   I have been informed and understand all the instructions given to me. I know to contact the clinic, my physician, or go to the Emergency Department if any problems should occur. I do not have any questions at this time, but understand that I may call the clinic during office hours   should I have any questions or need assistance in obtaining follow up care.    __________________________________________  _____________  __________ Signature of Patient or Authorized Representative            Date                   Time    __________________________________________ Nurse's Signature    

## 2012-05-26 ENCOUNTER — Ambulatory Visit: Payer: Medicare Other

## 2012-05-26 ENCOUNTER — Telehealth: Payer: Self-pay | Admitting: Medical Oncology

## 2012-05-26 ENCOUNTER — Other Ambulatory Visit (HOSPITAL_BASED_OUTPATIENT_CLINIC_OR_DEPARTMENT_OTHER): Payer: Medicare Other | Admitting: Lab

## 2012-05-26 DIAGNOSIS — C3490 Malignant neoplasm of unspecified part of unspecified bronchus or lung: Secondary | ICD-10-CM | POA: Diagnosis not present

## 2012-05-26 DIAGNOSIS — T451X5A Adverse effect of antineoplastic and immunosuppressive drugs, initial encounter: Secondary | ICD-10-CM

## 2012-05-26 LAB — CBC WITH DIFFERENTIAL/PLATELET
Eosinophils Absolute: 0 10*3/uL (ref 0.0–0.5)
MONO#: 0 10*3/uL — ABNORMAL LOW (ref 0.1–0.9)
NEUT#: 0.4 10*3/uL — CL (ref 1.5–6.5)
RBC: 3.37 10*6/uL — ABNORMAL LOW (ref 3.70–5.45)
RDW: 15.8 % — ABNORMAL HIGH (ref 11.2–14.5)
WBC: 1.4 10*3/uL — ABNORMAL LOW (ref 3.9–10.3)
nRBC: 0 % (ref 0–0)

## 2012-05-26 NOTE — Telephone Encounter (Signed)
Notified patient of Neutrapenic  precautions- patient  voicee understanding. Teach back completed for when to call . Lab appointment given to pt for next week

## 2012-05-26 NOTE — Progress Notes (Unsigned)
Hold treatment today for ANC 0.4 per Dr.Mohamed. Instructed patient about neutropenic precautions, patient verbalized understanding.

## 2012-05-28 ENCOUNTER — Telehealth: Payer: Self-pay | Admitting: *Deleted

## 2012-05-28 NOTE — Telephone Encounter (Signed)
Desk nurse made patient appointment for 06-02-2012 lab only at 10:45am patient is aware of the appointment

## 2012-06-01 ENCOUNTER — Telehealth: Payer: Self-pay | Admitting: Medical Oncology

## 2012-06-01 NOTE — Telephone Encounter (Signed)
Concerned that WBC not coming back up quick enough. Asking if Destiny Harrison needs to make "Erasmo Score arrangements". I told her Destiny Harrison did not need to make funeral arrangements because her WBC is low and  I explained the effect of chemo on bone marrow and recovery . Destiny Harrison understands that Destiny Harrison  is going to modify her chemo to a single agent chemo . I told her I will ask nurse to review her labs with her tomorrow.

## 2012-06-02 ENCOUNTER — Other Ambulatory Visit (HOSPITAL_BASED_OUTPATIENT_CLINIC_OR_DEPARTMENT_OTHER): Payer: Medicare Other | Admitting: Lab

## 2012-06-02 ENCOUNTER — Telehealth: Payer: Self-pay | Admitting: *Deleted

## 2012-06-02 DIAGNOSIS — D701 Agranulocytosis secondary to cancer chemotherapy: Secondary | ICD-10-CM

## 2012-06-02 DIAGNOSIS — D702 Other drug-induced agranulocytosis: Secondary | ICD-10-CM | POA: Diagnosis not present

## 2012-06-02 DIAGNOSIS — T451X5A Adverse effect of antineoplastic and immunosuppressive drugs, initial encounter: Secondary | ICD-10-CM | POA: Diagnosis not present

## 2012-06-02 LAB — COMPREHENSIVE METABOLIC PANEL
AST: 20 U/L (ref 0–37)
Albumin: 3.9 g/dL (ref 3.5–5.2)
BUN: 8 mg/dL (ref 6–23)
Calcium: 9.1 mg/dL (ref 8.4–10.5)
Chloride: 103 mEq/L (ref 96–112)
Glucose, Bld: 89 mg/dL (ref 70–99)
Potassium: 3.9 mEq/L (ref 3.5–5.3)
Total Protein: 7.3 g/dL (ref 6.0–8.3)

## 2012-06-02 LAB — CBC WITH DIFFERENTIAL/PLATELET
BASO%: 0 % (ref 0.0–2.0)
Basophils Absolute: 0 10*3/uL (ref 0.0–0.1)
HCT: 26 % — ABNORMAL LOW (ref 34.8–46.6)
HGB: 9.1 g/dL — ABNORMAL LOW (ref 11.6–15.9)
MONO#: 0.3 10*3/uL (ref 0.1–0.9)
NEUT%: 18.8 % — ABNORMAL LOW (ref 38.4–76.8)
WBC: 1.8 10*3/uL — ABNORMAL LOW (ref 3.9–10.3)
lymph#: 1.1 10*3/uL (ref 0.9–3.3)

## 2012-06-02 NOTE — Telephone Encounter (Signed)
Per Dr Donnald Garre, pt needs to practice neutropenic pxns, ANC 0.3, WBC 1.8.  Pt verbalized understanding, understood that low WBC is r/t her getting chemotherapy and we will continue to monitor this.  SLJ

## 2012-06-09 ENCOUNTER — Ambulatory Visit: Payer: Medicare Other

## 2012-06-09 ENCOUNTER — Other Ambulatory Visit (HOSPITAL_BASED_OUTPATIENT_CLINIC_OR_DEPARTMENT_OTHER): Payer: Medicare Other | Admitting: Lab

## 2012-06-09 DIAGNOSIS — C3410 Malignant neoplasm of upper lobe, unspecified bronchus or lung: Secondary | ICD-10-CM

## 2012-06-09 DIAGNOSIS — C3490 Malignant neoplasm of unspecified part of unspecified bronchus or lung: Secondary | ICD-10-CM

## 2012-06-09 LAB — CBC WITH DIFFERENTIAL/PLATELET
BASO%: 1.1 % (ref 0.0–2.0)
LYMPH%: 52.8 % — ABNORMAL HIGH (ref 14.0–49.7)
MCHC: 34.5 g/dL (ref 31.5–36.0)
MONO#: 0.6 10*3/uL (ref 0.1–0.9)
Platelets: 205 10*3/uL (ref 145–400)
RBC: 3.25 10*6/uL — ABNORMAL LOW (ref 3.70–5.45)
RDW: 18.6 % — ABNORMAL HIGH (ref 11.2–14.5)
WBC: 2.7 10*3/uL — ABNORMAL LOW (ref 3.9–10.3)
nRBC: 0 % (ref 0–0)

## 2012-06-09 NOTE — Progress Notes (Signed)
1100-Dr. Mohamed notified of CBC results from this AM.  WBC-2.7 and ANC-0.6.  Pt complaining of fatigue, but no other complaints at this time.  Pt verbalizes knowledge of neutropenic precautions and states that she is checking her temperature twice a day at home.  Order received to hold chemotherapy for today and to have pt return to [facility] as scheduled next week.

## 2012-06-16 ENCOUNTER — Telehealth: Payer: Self-pay | Admitting: Internal Medicine

## 2012-06-16 ENCOUNTER — Ambulatory Visit (HOSPITAL_BASED_OUTPATIENT_CLINIC_OR_DEPARTMENT_OTHER): Payer: Medicare Other

## 2012-06-16 ENCOUNTER — Other Ambulatory Visit (HOSPITAL_BASED_OUTPATIENT_CLINIC_OR_DEPARTMENT_OTHER): Payer: Medicare Other | Admitting: Lab

## 2012-06-16 ENCOUNTER — Ambulatory Visit (HOSPITAL_BASED_OUTPATIENT_CLINIC_OR_DEPARTMENT_OTHER): Payer: Medicare Other | Admitting: Physician Assistant

## 2012-06-16 VITALS — BP 125/74 | HR 122 | Temp 98.1°F | Resp 20 | Ht 64.0 in | Wt 136.2 lb

## 2012-06-16 DIAGNOSIS — M549 Dorsalgia, unspecified: Secondary | ICD-10-CM | POA: Diagnosis not present

## 2012-06-16 DIAGNOSIS — D649 Anemia, unspecified: Secondary | ICD-10-CM | POA: Diagnosis not present

## 2012-06-16 DIAGNOSIS — C771 Secondary and unspecified malignant neoplasm of intrathoracic lymph nodes: Secondary | ICD-10-CM

## 2012-06-16 DIAGNOSIS — C3490 Malignant neoplasm of unspecified part of unspecified bronchus or lung: Secondary | ICD-10-CM

## 2012-06-16 DIAGNOSIS — Z5111 Encounter for antineoplastic chemotherapy: Secondary | ICD-10-CM | POA: Diagnosis not present

## 2012-06-16 DIAGNOSIS — R52 Pain, unspecified: Secondary | ICD-10-CM

## 2012-06-16 DIAGNOSIS — C3410 Malignant neoplasm of upper lobe, unspecified bronchus or lung: Secondary | ICD-10-CM

## 2012-06-16 LAB — CBC WITH DIFFERENTIAL/PLATELET
BASO%: 0.8 % (ref 0.0–2.0)
EOS%: 1.3 % (ref 0.0–7.0)
HGB: 9.7 g/dL — ABNORMAL LOW (ref 11.6–15.9)
MCH: 27.9 pg (ref 25.1–34.0)
MCHC: 33.6 g/dL (ref 31.5–36.0)
RDW: 19.1 % — ABNORMAL HIGH (ref 11.2–14.5)
WBC: 4 10*3/uL (ref 3.9–10.3)
lymph#: 1.6 10*3/uL (ref 0.9–3.3)

## 2012-06-16 LAB — COMPREHENSIVE METABOLIC PANEL (CC13)
ALT: 10 U/L (ref 0–55)
AST: 16 U/L (ref 5–34)
Albumin: 3.5 g/dL (ref 3.5–5.0)
Alkaline Phosphatase: 77 U/L (ref 40–150)
Potassium: 4.2 mEq/L (ref 3.5–5.1)
Sodium: 135 mEq/L — ABNORMAL LOW (ref 136–145)
Total Protein: 7.1 g/dL (ref 6.4–8.3)

## 2012-06-16 MED ORDER — DEXAMETHASONE SODIUM PHOSPHATE 4 MG/ML IJ SOLN
20.0000 mg | Freq: Once | INTRAMUSCULAR | Status: AC
  Administered 2012-06-16: 20 mg via INTRAVENOUS

## 2012-06-16 MED ORDER — SODIUM CHLORIDE 0.9 % IV SOLN
800.0000 mg/m2 | Freq: Once | INTRAVENOUS | Status: AC
  Administered 2012-06-16: 1330 mg via INTRAVENOUS
  Filled 2012-06-16: qty 35

## 2012-06-16 MED ORDER — SODIUM CHLORIDE 0.9 % IV SOLN
Freq: Once | INTRAVENOUS | Status: AC
  Administered 2012-06-16: 12:00:00 via INTRAVENOUS

## 2012-06-16 MED ORDER — SODIUM CHLORIDE 0.9 % IJ SOLN
10.0000 mL | INTRAMUSCULAR | Status: DC | PRN
  Administered 2012-06-16: 10 mL
  Filled 2012-06-16: qty 10

## 2012-06-16 MED ORDER — HEPARIN SOD (PORK) LOCK FLUSH 100 UNIT/ML IV SOLN
500.0000 [IU] | Freq: Once | INTRAVENOUS | Status: AC | PRN
  Administered 2012-06-16: 500 [IU]
  Filled 2012-06-16: qty 5

## 2012-06-16 MED ORDER — HYDROCODONE-ACETAMINOPHEN 5-500 MG PO TABS: ORAL_TABLET | ORAL | Status: DC

## 2012-06-16 MED ORDER — SODIUM CHLORIDE 0.9 % IV SOLN
389.6000 mg | Freq: Once | INTRAVENOUS | Status: AC
  Administered 2012-06-16: 390 mg via INTRAVENOUS
  Filled 2012-06-16: qty 39

## 2012-06-16 MED ORDER — ONDANSETRON 16 MG/50ML IVPB (CHCC)
16.0000 mg | Freq: Once | INTRAVENOUS | Status: AC
  Administered 2012-06-16: 16 mg via INTRAVENOUS

## 2012-06-16 NOTE — Patient Instructions (Signed)
 Cancer Center Discharge Instructions for Patients Receiving Chemotherapy  Today you received the following chemotherapy agents  Gemzar, Carboplatin To help prevent nausea and vomiting after your treatment, we encourage you to take your nausea medication as directed by MD  If you develop nausea and vomiting that is not controlled by your nausea medication, call the clinic. If it is after clinic hours your family physician or the after hours number for the clinic or go to the Emergency Department.   BELOW ARE SYMPTOMS THAT SHOULD BE REPORTED IMMEDIATELY:  *FEVER GREATER THAN 100.5 F  *CHILLS WITH OR WITHOUT FEVER  NAUSEA AND VOMITING THAT IS NOT CONTROLLED WITH YOUR NAUSEA MEDICATION  *UNUSUAL SHORTNESS OF BREATH  *UNUSUAL BRUISING OR BLEEDING  TENDERNESS IN MOUTH AND THROAT WITH OR WITHOUT PRESENCE OF ULCERS  *URINARY PROBLEMS  *BOWEL PROBLEMS  UNUSUAL RASH Items with * indicate a potential emergency and should be followed up as soon as possible.  One of the nurses will contact you 24 hours after your treatment. Please let the nurse know about any problems that you may have experienced. Feel free to call the clinic you have any questions or concerns. The clinic phone number is [phone].   I have been informed and understand all the instructions given to me. I know to contact the clinic, my physician, or go to the Emergency Department if any problems should occur. I do not have any questions at this time, but understand that I may call the clinic during office hours   should I have any questions or need assistance in obtaining follow up care.    __________________________________________  _____________  __________ Signature of Patient or Authorized Representative            Date                   Time    __________________________________________ Nurse's Signature

## 2012-06-16 NOTE — Telephone Encounter (Signed)
appts made and printed for pt aom °

## 2012-06-16 NOTE — Patient Instructions (Addendum)
Follow up in 3 weeks prior to your next cycle of chemotherapy. Continue with weekly labs as scheduled

## 2012-06-17 NOTE — Progress Notes (Signed)
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   PRIOR THERAPY:  1. Status post palliative radiotherapy to the left lung mass under the care of Dr. Michell Heinrich completed on 03/06/2012.  2. Systemic chemotherapy with carboplatin for AUC of 6 on day 1 and Abraxane 100 mg/M2 on days 1, 8 and 15 every 3 weeks. Status post 2 cycles  From cycle 3 forward AUC will be decreased to 4.5 given on day 1 and the Abraxane will be decreased to 90 mg per meter squared on days 1, 8 and 15 every 3 weeks, Status post a total of 3 cycles   CURRENT THERAPY: Systemic chemotherapy with carboplatin for AUC of 5 on day 1 and gemcitabine 1000 mg/m2 given on day 1 and day 8 every 3 weeks,status post 1 cycle. Due to significant neutropenia she will be dosed reduced beginning cycle 2 forward to carboplatin at an AUC of 4 given on day 1 and gemcitabine at 800 mg per meter squared given on days 1 and 8 every 3 weeks.  INTERVAL HISTORY: Destiny Harrison 71 y.o. female returns to the clinic today for followup visit accompanied by her son. She complains of some pain affecting her back and knees and averages 3 Vicodin tablets daily. She requests a refill for the Vicodin tablets. She continues to have numbness and tingling in her feet and fingers. She reports that this is stable. She does occasionally drop a eating utensils or pencil. She reports that she still able to write her name and button her clothes. 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  She tolerated the last cycle of her chemotherapy fairly well except for significant neutropenia. She received few days of Neupogen injection. She denied having any significant chest pain, shortness of breath, cough or hemoptysis.  She has no weight loss or night sweats. No significant nausea or vomiting.   MEDICAL HISTORY: Past Medical History  Diagnosis Date  . COPD (chronic obstructive pulmonary disease)   . Cancer   . Neutropenia, drug-induced 05/05/2012  . Neutropenia, drug-induced 05/05/2012    ALLERGIES:   has no known allergies.  MEDICATIONS:  Current Outpatient Prescriptions  Medication Sig Dispense Refill  . albuterol (PROVENTIL HFA;VENTOLIN HFA) 108 (90 BASE) MCG/ACT inhaler Inhale 2 puffs into the lungs every 6 (six) hours as needed. WHEEZING AND SHORTNESS OF BREATH      . albuterol (PROVENTIL) (2.5 MG/3ML) 0.083% nebulizer solution Take 3 mLs (2.5 mg total) by nebulization every 4 (four) hours as needed for wheezing.  75 mL  12  . calcium-vitamin D (OSCAL WITH D) 500-200 MG-UNIT per tablet Take 1 tablet by mouth daily.      . folic acid (FOLVITE) 400 MCG tablet Take 400 mcg by mouth daily.      Marland Kitchen HYDROcodone-acetaminophen (VICODIN) 5-500 MG per tablet Take 1 tablet by mouth every 4 to 6 hours as needed for pain  60 tablet  0  . ipratropium (ATROVENT) 0.02 % nebulizer solution Take 2.5 mLs (500 mcg total) by nebulization 4 (four) times daily.  75 mL  12  . lidocaine-prilocaine (EMLA) cream Apply topically as needed.  30 g  0  . ondansetron (ZOFRAN) 8 MG tablet       . predniSONE (DELTASONE)  5 MG tablet Take 5 mg by mouth daily.        . prochlorperazine (COMPAZINE) 10 MG tablet Take 10 mg by mouth every 6 (six) hours as needed. NAUSEA      . simvastatin (ZOCOR) 5 MG tablet Take 5 mg by mouth at bedtime.      Marland Kitchen SPIRIVA HANDIHALER 18 MCG inhalation capsule       . sucralfate (CARAFATE) 1 G tablet Take 1 tablet (1 g total) by mouth 4 (four) times daily. Dissolve 1 tablet in water prior to ingesting 4 times per day.  60 tablet  1   No current facility-administered medications for this visit.   Facility-Administered Medications Ordered in Other Visits  Medication Dose Route Frequency Provider Last Rate  Last Dose  . 0.9 %  sodium chloride infusion   Intravenous Once Si Gaul, MD      . CARBOplatin (PARAPLATIN) 390 mg in sodium chloride 0.9 % 100 mL chemo infusion  390 mg Intravenous Once Si Gaul, MD   390 mg at 06/16/12 1330  . dexamethasone (DECADRON) injection 20 mg  20 mg Intravenous Once Si Gaul, MD   20 mg at 06/16/12 1217  . gemcitabine (GEMZAR) 1,330 mg in sodium chloride 0.9 % 100 mL chemo infusion  800 mg/m2 (Treatment Plan Actual) Intravenous Once Si Gaul, MD   1,330 mg at 06/16/12 1253  . heparin lock flush 100 unit/mL  500 Units Intracatheter Once PRN Si Gaul, MD   500 Units at 06/16/12 1414  . ondansetron (ZOFRAN) IVPB 16 mg  16 mg Intravenous Once Si Gaul, MD   16 mg at 06/16/12 1217  . DISCONTD: sodium chloride 0.9 % injection 10 mL  10 mL Intracatheter PRN Si Gaul, MD   10 mL at 06/16/12 1414    SURGICAL HISTORY:  Past Surgical History  Procedure Date  . Appendex 1962  . Video bronchoscopy 01/28/2012    Procedure: VIDEO BRONCHOSCOPY WITHOUT FLUORO;  Surgeon: Kalman Shan, MD;  Location[facility];  Service: Endoscopy;  Laterality: Bilateral;  . Surgery on right wrist     REVIEW OF SYSTEMS:  A comprehensive review of systems was negative except for: Constitutional: positive for fatigue Musculoskeletal: positive for back pain Neurological: positive for paresthesia   PHYSICAL EXAMINATION: General appearance: alert, cooperative and no distress Head: Normocephalic, without obvious abnormality, atraumatic Neck: no adenopathy Resp: clear to auscultation bilaterally Cardio: regular rate and rhythm, S1, S2 normal, no murmur, click, rub or gallop GI: soft, non-tender; bowel sounds normal; no masses,  no organomegaly Extremities: extremities normal, atraumatic, no cyanosis or edema Neurologic: Alert and oriented X 3, normal strength and tone. Normal symmetric reflexes. Normal coordination and gait  ECOG PERFORMANCE  STATUS: 1 - Symptomatic but completely ambulatory  Blood pressure 125/74, pulse 122, temperature 98.1 F (36.7 C), temperature source Oral, resp. rate 20, height 5\' 4"  (1.626 m), weight 136 lb 3.2 oz (61.78 kg).  LABORATORY DATA: Lab Results  Component Value Date   WBC 4.0 06/16/2012   HGB 9.7* 06/16/2012   HCT 28.9* 06/16/2012   MCV 83.0 06/16/2012   PLT 333 06/16/2012      Chemistry      Component Value Date/Time   NA 135* 06/16/2012 0959   NA 139 06/02/2012 1043   K 4.2 06/16/2012 0959   K 3.9 06/02/2012 1043   CL 101 06/16/2012 0959   CL 103 06/02/2012 1043   CO2 25 06/16/2012 0959   CO2 27 06/02/2012 1043  BUN 9.0 06/16/2012 0959   BUN 8 06/02/2012 1043   CREATININE 0.8 06/16/2012 0959   CREATININE 0.67 06/02/2012 1043      Component Value Date/Time   CALCIUM 9.2 06/16/2012 0959   CALCIUM 9.1 06/02/2012 1043   ALKPHOS 77 06/16/2012 0959   ALKPHOS 68 06/02/2012 1043   AST 16 06/16/2012 0959   AST 20 06/02/2012 1043   ALT 10 06/16/2012 0959   ALT 14 06/02/2012 1043   BILITOT 0.60 06/16/2012 0959   BILITOT 0.7 06/02/2012 1043       RADIOGRAPHIC STUDIES: Ct Chest W Contrast  05/07/2012  *RADIOLOGY REPORT*  Clinical Data:  Lung cancer diagnosed November 2012, radiation therapy complete.  Chemotherapy in progress.  CT CHEST, ABDOMEN AND PELVIS WITH CONTRAST  Technique:  Multidetector CT imaging of the chest, abdomen and pelvis was performed following the standard protocol during bolus administration of intravenous contrast.  Contrast: OMNIPAQUE IOHEXOL 300 MG/ML  SOLN  Comparison:  PET CT scan 02/20/2012, CT scan 01/14/2012   CT CHEST  Findings:  No axillary or supraclavicular lymphadenopathy. Infraclavicular node on the right measures 14 mm and is increased from 5 mm on comparison PET CT scan.  High right paratracheal lymph node measures 16 mm increased from 11 mm on prior.  15 mm lower paratracheal lymph node is also increased in size.  These nodes have  central necrosis.  The left  perihilar endobronchial lesion is decreased in volume with now only peribronchial thickening remaining with no measurable mass (image 21).  The  There is fibrotic change in the upper lobes consistent with radiation therapy.  No suspicious pulmonary nodules.  IMPRESSION:  1.  Progression of mediastinal nodal metastasis with increase in size of right infraclavicular and right paratracheal lymph nodes. 2.  Interval decrease in size of left perihilar endobronchial lesion with now only peribronchial thickening remaining.   CT ABDOMEN AND PELVIS  Findings:  No focal hepatic lesion.  The gallbladder, pancreas, spleen, adrenal glands, and kidneys are normal.  The stomach, small bowel, and colon are normal.  Abdominal aorta normal caliber.  No retroperitoneal or periportal lymphadenopathy.  The bladder and uterus and ovaries are normal.  No pelvic lymphadenopathy.  Increased trabeculation within the proximal right femur which could represent Paget's disease. Degenerative changes of the spine.  No aggressive osseous lesions.  IMPRESSION:  1.  No metastasis within the abdomen or pelvis. 2.  Paget's disease of the right femur.  Original Report Authenticated By: Genevive Bi, M.D.    ASSESSMENT: This is a very pleasant 71 years old African American female with metastatic non-small cell lung cancer, squamous cell carcinoma status post palliative radiotherapy followed by 3 cycles of systemic chemotherapy with carboplatin and Abraxane. The patient has mixed response to this treatment with decrease in the size of the left perihilar lesion but progression of the mediastinal nodal metastasis. She is now being treated with systemic chemotherapy in the form of carboplatin for an AUC of 5 given on day 1 and gemcitabine 1000 mg read square given on days 1 and 8 every 3 weeks status post 1 cycle. She did however continue to have some neutropenia even with this chemotherapy regimen and therefore will be dose reduced to carboplatin  for an AUC of 4 given on day 1 and gemcitabine at 800 mg read square given on days 1 and 8 every 3 weeks from cycle to forward. Patient was discussed with Dr. Arbutus Ped. To proceed with cycle #2 of her systemic chemotherapy  with carboplatin and gemcitabine at the dose reduced rate. She will continue with weekly labs consisting of a CBC differential and C. met. She is given a prescription for her Vicodin 5/ 500, one tablet by mouth every 6 hours as needed for pain, total of 60 tablets with no refill. She will return in 3 weeks prior to cycle #3 of her systemic chemotherapy with carboplatin and gemcitabine.  Laural Benes, Prosper Paff E, PA-C   All questions were answered. The patient knows to call the clinic with any problems, questions or concerns. We can certainly see the patient much sooner if necessary.  I spent 20 minutes counseling the patient face to face. The total time spent in the appointment was 30 minutes.

## 2012-06-30 ENCOUNTER — Ambulatory Visit: Payer: Medicare Other

## 2012-06-30 ENCOUNTER — Telehealth: Payer: Self-pay | Admitting: *Deleted

## 2012-06-30 ENCOUNTER — Other Ambulatory Visit: Payer: Medicare Other | Admitting: Lab

## 2012-06-30 NOTE — Telephone Encounter (Signed)
Per staff message I have rescheduled appts from today to 9/17.  JMW

## 2012-07-07 ENCOUNTER — Ambulatory Visit (HOSPITAL_BASED_OUTPATIENT_CLINIC_OR_DEPARTMENT_OTHER): Payer: Medicare Other | Admitting: Physician Assistant

## 2012-07-07 ENCOUNTER — Ambulatory Visit: Payer: Medicare Other

## 2012-07-07 ENCOUNTER — Telehealth: Payer: Self-pay | Admitting: Internal Medicine

## 2012-07-07 ENCOUNTER — Encounter: Payer: Self-pay | Admitting: Physician Assistant

## 2012-07-07 ENCOUNTER — Ambulatory Visit (HOSPITAL_BASED_OUTPATIENT_CLINIC_OR_DEPARTMENT_OTHER): Payer: Medicare Other

## 2012-07-07 ENCOUNTER — Other Ambulatory Visit (HOSPITAL_BASED_OUTPATIENT_CLINIC_OR_DEPARTMENT_OTHER): Payer: Medicare Other | Admitting: Lab

## 2012-07-07 VITALS — BP 148/73 | HR 118 | Temp 97.5°F | Resp 20 | Ht 64.0 in | Wt 134.9 lb

## 2012-07-07 DIAGNOSIS — D709 Neutropenia, unspecified: Secondary | ICD-10-CM

## 2012-07-07 DIAGNOSIS — C771 Secondary and unspecified malignant neoplasm of intrathoracic lymph nodes: Secondary | ICD-10-CM

## 2012-07-07 DIAGNOSIS — C3410 Malignant neoplasm of upper lobe, unspecified bronchus or lung: Secondary | ICD-10-CM | POA: Diagnosis not present

## 2012-07-07 DIAGNOSIS — C3490 Malignant neoplasm of unspecified part of unspecified bronchus or lung: Secondary | ICD-10-CM

## 2012-07-07 DIAGNOSIS — D702 Other drug-induced agranulocytosis: Secondary | ICD-10-CM

## 2012-07-07 LAB — CBC WITH DIFFERENTIAL/PLATELET
Basophils Absolute: 0 10*3/uL (ref 0.0–0.1)
EOS%: 1.9 % (ref 0.0–7.0)
Eosinophils Absolute: 0.1 10*3/uL (ref 0.0–0.5)
HCT: 28.5 % — ABNORMAL LOW (ref 34.8–46.6)
HGB: 9.4 g/dL — ABNORMAL LOW (ref 11.6–15.9)
MCH: 28.5 pg (ref 25.1–34.0)
NEUT#: 0.8 10*3/uL — ABNORMAL LOW (ref 1.5–6.5)
NEUT%: 26.8 % — ABNORMAL LOW (ref 38.4–76.8)
RDW: 19 % — ABNORMAL HIGH (ref 11.2–14.5)
lymph#: 1.5 10*3/uL (ref 0.9–3.3)

## 2012-07-07 MED ORDER — HYDROCODONE-HOMATROPINE 5-1.5 MG/5ML PO SYRP: 5.0000 mL | ORAL_SOLUTION | Freq: Four times a day (QID) | ORAL | Status: DC | PRN

## 2012-07-07 MED ORDER — FILGRASTIM 300 MCG/0.5ML IJ SOLN
300.0000 ug | Freq: Once | INTRAMUSCULAR | Status: AC
  Administered 2012-07-07: 300 ug via SUBCUTANEOUS
  Filled 2012-07-07: qty 0.5

## 2012-07-07 NOTE — Telephone Encounter (Signed)
Gave pt appt for September, October 2013 lab, chemo,  Injections, Pt is also having CT, gave pt oral contrast and NPO 4 hours prior to CT

## 2012-07-07 NOTE — Progress Notes (Signed)
[facility] [phone]   [phone]  OFFICE PROGRESS NOTE  Destiny Carrow, MD [address] [place] [address]  DIAGNOSIS: Metastatic non-small cell lung cancer, squamous cell carcinoma diagnosed in March of 2013.   PRIOR THERAPY:  1. Status post palliative radiotherapy to the left lung mass under the care of Dr. Michell Heinrich completed on 03/06/2012.  2. Systemic chemotherapy with carboplatin for AUC of 6 on day 1 and Abraxane 100 mg/M2 on days 1, 8 and 15 every 3 weeks. Status post 2 cycles  From cycle 3 forward AUC will be decreased to 4.5 given on day 1 and the Abraxane will be decreased to 90 mg per meter squared on days 1, 8 and 15 every 3 weeks, Status post a total of 3 cycles   CURRENT THERAPY: Systemic chemotherapy with carboplatin for AUC of 5 on day 1 and gemcitabine 1000 mg/m2 given on day 1 and day 8 every 3 weeks,status post 1 cycle. Due to significant neutropenia she will be dosed reduced beginning cycle 2 forward to carboplatin at an AUC of 4 given on day 1 and gemcitabine at 800 mg per meter squared given on days 1 and 8 every 3 weeks. Status post 2 cycles  INTERVAL HISTORY: Destiny Harrison 71 y.o. female returns to the clinic today for followup visit accompanied by her son. She complains of a dry hacking cough that often leads to increase shortness of breath and increased heart rate. She denies any syncopal or presyncopal episodes. She requests a refill for her Hycodan cough syrup. She's tolerating her chemotherapy relatively well denying fever, chills, diarrhea, constipation, nausea or vomiting. She reports that her appetite is fair and she is sleeping well.  She denied having any significant chest pain, shortness of breath,or hemoptysis. She has no weight loss or night sweats. No significant nausea or vomiting.   MEDICAL HISTORY: Past Medical History  Diagnosis Date  . COPD (chronic obstructive pulmonary disease)   . Cancer   .  Neutropenia, drug-induced 05/05/2012  . Neutropenia, drug-induced 05/05/2012    ALLERGIES:   has no known allergies.  MEDICATIONS:  Current Outpatient Prescriptions  Medication Sig Dispense Refill  . albuterol (PROVENTIL HFA;VENTOLIN HFA) 108 (90 BASE) MCG/ACT inhaler Inhale 2 puffs into the lungs every 6 (six) hours as needed. WHEEZING AND SHORTNESS OF BREATH      . albuterol (PROVENTIL) (2.5 MG/3ML) 0.083% nebulizer solution Take 3 mLs (2.5 mg total) by nebulization every 4 (four) hours as needed for wheezing.  75 mL  12  . calcium-vitamin D (OSCAL WITH D) 500-200 MG-UNIT per tablet Take 1 tablet by mouth daily.      . folic acid (FOLVITE) 400 MCG tablet Take 400 mcg by mouth daily.      Marland Kitchen HYDROcodone-acetaminophen (VICODIN) 5-500 MG per tablet Take 1 tablet by mouth every 4 to 6 hours as needed for pain  60 tablet  0  . ipratropium (ATROVENT) 0.02 % nebulizer solution Take 2.5 mLs (500 mcg total) by nebulization 4 (four) times daily.  75 mL  12  . lidocaine-prilocaine (EMLA) cream Apply topically as needed.  30 g  0  . ondansetron (ZOFRAN) 8 MG tablet       . predniSONE (DELTASONE) 5 MG tablet Take 5 mg by mouth daily.        . prochlorperazine (COMPAZINE) 10 MG tablet Take 10 mg by mouth every 6 (six) hours as needed. NAUSEA      .  simvastatin (ZOCOR) 5 MG tablet Take 5 mg by mouth at bedtime.      Marland Kitchen SPIRIVA HANDIHALER 18 MCG inhalation capsule       . sucralfate (CARAFATE) 1 G tablet Take 1 tablet (1 g total) by mouth 4 (four) times daily. Dissolve 1 tablet in water prior to ingesting 4 times per day.  60 tablet  1   No current facility-administered medications for this visit.   Facility-Administered Medications Ordered in Other Visits  Medication Dose Route Frequency Provider Last Rate Last Dose  . filgrastim (NEUPOGEN) injection 300 mcg  300 mcg Subcutaneous Once Si Gaul, MD   300 mcg at 07/07/12 1118    SURGICAL HISTORY:  Past Surgical History  Procedure Date  .  Appendex 1962  . Video bronchoscopy 01/28/2012    Procedure: VIDEO BRONCHOSCOPY WITHOUT FLUORO;  Surgeon: Kalman Shan, MD;  Location: [facility] ENDOSCOPY;  Service: Endoscopy;  Laterality: Bilateral;  . Surgery on right wrist     REVIEW OF SYSTEMS:  Pertinent items are noted in HPI.   PHYSICAL EXAMINATION: General appearance: alert, cooperative and no distress Head: Normocephalic, without obvious abnormality, atraumatic Neck: no adenopathy Resp: clear to auscultation bilaterally Cardio: regular rate and rhythm, S1, S2 normal, no murmur, click, rub or gallop GI: soft, non-tender; bowel sounds normal; no masses,  no organomegaly Extremities: extremities normal, atraumatic, no cyanosis or edema Neurologic: Alert and oriented X 3, normal strength and tone. Normal symmetric reflexes. Normal coordination and gait  ECOG PERFORMANCE STATUS: 1 - Symptomatic but completely ambulatory  Blood pressure 148/73, pulse 118, temperature 97.5 F (36.4 C), resp. rate 20, height 5\' 4"  (1.626 m), weight 134 lb 14.4 oz (61.19 kg).  LABORATORY DATA: Lab Results  Component Value Date   WBC 3.1* 07/07/2012   HGB 9.4* 07/07/2012   HCT 28.5* 07/07/2012   MCV 86.4 07/07/2012   PLT 299 07/07/2012      Chemistry      Component Value Date/Time   NA 135* 06/16/2012 0959   NA 139 06/02/2012 1043   K 4.2 06/16/2012 0959   K 3.9 06/02/2012 1043   CL 101 06/16/2012 0959   CL 103 06/02/2012 1043   CO2 25 06/16/2012 0959   CO2 27 06/02/2012 1043   BUN 9.0 06/16/2012 0959   BUN 8 06/02/2012 1043   CREATININE 0.8 06/16/2012 0959   CREATININE 0.67 06/02/2012 1043      Component Value Date/Time   CALCIUM 9.2 06/16/2012 0959   CALCIUM 9.1 06/02/2012 1043   ALKPHOS 77 06/16/2012 0959   ALKPHOS 68 06/02/2012 1043   AST 16 06/16/2012 0959   AST 20 06/02/2012 1043   ALT 10 06/16/2012 0959   ALT 14 06/02/2012 1043   BILITOT 0.60 06/16/2012 0959   BILITOT 0.7 06/02/2012 1043       RADIOGRAPHIC STUDIES: Ct Chest W  Contrast  05/07/2012  *RADIOLOGY REPORT*  Clinical Data:  Lung cancer diagnosed November 2012, radiation therapy complete.  Chemotherapy in progress.  CT CHEST, ABDOMEN AND PELVIS WITH CONTRAST  Technique:  Multidetector CT imaging of the chest, abdomen and pelvis was performed following the standard protocol during bolus administration of intravenous contrast.  Contrast: OMNIPAQUE IOHEXOL 300 MG/ML  SOLN  Comparison:  PET CT scan 02/20/2012, CT scan 01/14/2012   CT CHEST  Findings:  No axillary or supraclavicular lymphadenopathy. Infraclavicular node on the right measures 14 mm and is increased from 5 mm on comparison PET CT scan.  High right paratracheal lymph node  measures 16 mm increased from 11 mm on prior.  15 mm lower paratracheal lymph node is also increased in size.  These nodes have  central necrosis.  The left perihilar endobronchial lesion is decreased in volume with now only peribronchial thickening remaining with no measurable mass (image 21).  The  There is fibrotic change in the upper lobes consistent with radiation therapy.  No suspicious pulmonary nodules.  IMPRESSION:  1.  Progression of mediastinal nodal metastasis with increase in size of right infraclavicular and right paratracheal lymph nodes. 2.  Interval decrease in size of left perihilar endobronchial lesion with now only peribronchial thickening remaining.   CT ABDOMEN AND PELVIS  Findings:  No focal hepatic lesion.  The gallbladder, pancreas, spleen, adrenal glands, and kidneys are normal.  The stomach, small bowel, and colon are normal.  Abdominal aorta normal caliber.  No retroperitoneal or periportal lymphadenopathy.  The bladder and uterus and ovaries are normal.  No pelvic lymphadenopathy.  Increased trabeculation within the proximal right femur which could represent Paget's disease. Degenerative changes of the spine.  No aggressive osseous lesions.  IMPRESSION:  1.  No metastasis within the abdomen or pelvis. 2.  Paget's  disease of the right femur.  Original Report Authenticated By: Genevive Bi, M.D.    ASSESSMENT: This is a very pleasant 71 years old African American female with metastatic non-small cell lung cancer, squamous cell carcinoma status post palliative radiotherapy followed by 3 cycles of systemic chemotherapy with carboplatin and Abraxane. The patient has mixed response to this treatment with decrease in the size of the left perihilar lesion but progression of the mediastinal nodal metastasis. She is now being treated with systemic chemotherapy in the form of carboplatin for an AUC of 5 given on day 1 and gemcitabine 1000 mg read square given on days 1 and 8 every 3 weeks status post 1 cycle. She did however continue to have some neutropenia even with this chemotherapy regimen and therefore will be dose reduced to carboplatin for an AUC of 4 given on day 1 and gemcitabine at 800 mg read square given on days 1 and 8 every 3 weeks from cycle to forward. Status post a total of 2 cycles of chemotherapy with this regimen. Patient was discussed with Dr. Arbutus Ped. Her ANC is subtherapeutic, at 0.8, to proceed with cycle #3 as scheduled today. She will again be treated with Neupogen at 300 mcg subcutaneously for 3 days to address her neutropenia. Neutropenic precautions were discussed and the patient and her son both voiced understanding. We'll reschedule the start of cycle 3 to begin in one week pending adequate counts. She will followup with Dr. Arbutus Ped in 4 weeks prior to her next scheduled cycle of chemotherapy with a repeat CBC differential C. met, and CT the chest abdomen and pelvis with contrast to reevaluate her disease. To address her cough, she was given a prescription for Hycodan cough syrup.  Laural Benes, Kamaree Berkel E, PA-C   All questions were answered. The patient knows to call the clinic with any problems, questions or concerns. We can certainly see the patient much sooner if necessary.  I spent 20 minutes  counseling the patient face to face. The total time spent in the appointment was 30 minutes.

## 2012-07-07 NOTE — Addendum Note (Signed)
Addended by: Conni Slipper on: 07/07/2012 03:02 PM   Modules accepted: Orders

## 2012-07-07 NOTE — Patient Instructions (Addendum)
Neupogen injections for 3 days starting today to help support your low neutrophil count Return in 1 week for labs and the start of cycle #3 of your chemotherapy Follow up with Dr. Arbutus Ped in 4 weeks with a restaging CT scan of your chest, abdomen and pelvis to re-evaluate your disease

## 2012-07-08 ENCOUNTER — Ambulatory Visit (HOSPITAL_BASED_OUTPATIENT_CLINIC_OR_DEPARTMENT_OTHER): Payer: Medicare Other

## 2012-07-08 VITALS — BP 115/67 | HR 112 | Temp 97.8°F

## 2012-07-08 DIAGNOSIS — D709 Neutropenia, unspecified: Secondary | ICD-10-CM

## 2012-07-08 MED ORDER — FILGRASTIM 300 MCG/0.5ML IJ SOLN
300.0000 ug | Freq: Once | INTRAMUSCULAR | Status: AC
  Administered 2012-07-08: 300 ug via SUBCUTANEOUS
  Filled 2012-07-08: qty 0.5

## 2012-07-09 ENCOUNTER — Other Ambulatory Visit: Payer: Self-pay | Admitting: Physician Assistant

## 2012-07-09 ENCOUNTER — Ambulatory Visit (HOSPITAL_BASED_OUTPATIENT_CLINIC_OR_DEPARTMENT_OTHER): Payer: Medicare Other

## 2012-07-09 VITALS — BP 121/69 | HR 121 | Temp 98.7°F

## 2012-07-09 DIAGNOSIS — D709 Neutropenia, unspecified: Secondary | ICD-10-CM | POA: Diagnosis not present

## 2012-07-09 DIAGNOSIS — D702 Other drug-induced agranulocytosis: Secondary | ICD-10-CM

## 2012-07-09 DIAGNOSIS — C3490 Malignant neoplasm of unspecified part of unspecified bronchus or lung: Secondary | ICD-10-CM

## 2012-07-09 MED ORDER — FILGRASTIM 300 MCG/0.5ML IJ SOLN
300.0000 ug | Freq: Once | INTRAMUSCULAR | Status: AC
  Administered 2012-07-09: 300 ug via SUBCUTANEOUS
  Filled 2012-07-09: qty 0.5

## 2012-07-14 ENCOUNTER — Ambulatory Visit (HOSPITAL_BASED_OUTPATIENT_CLINIC_OR_DEPARTMENT_OTHER): Payer: Medicare Other

## 2012-07-14 ENCOUNTER — Other Ambulatory Visit: Payer: Self-pay | Admitting: Internal Medicine

## 2012-07-14 ENCOUNTER — Other Ambulatory Visit (HOSPITAL_BASED_OUTPATIENT_CLINIC_OR_DEPARTMENT_OTHER): Payer: Medicare Other | Admitting: Lab

## 2012-07-14 DIAGNOSIS — C3490 Malignant neoplasm of unspecified part of unspecified bronchus or lung: Secondary | ICD-10-CM | POA: Diagnosis not present

## 2012-07-14 DIAGNOSIS — C3410 Malignant neoplasm of upper lobe, unspecified bronchus or lung: Secondary | ICD-10-CM

## 2012-07-14 DIAGNOSIS — Z5111 Encounter for antineoplastic chemotherapy: Secondary | ICD-10-CM | POA: Diagnosis not present

## 2012-07-14 LAB — COMPREHENSIVE METABOLIC PANEL (CC13)
ALT: 11 U/L (ref 0–55)
CO2: 26 mEq/L (ref 22–29)
Calcium: 9.3 mg/dL (ref 8.4–10.4)
Chloride: 103 mEq/L (ref 98–107)
Creatinine: 0.8 mg/dL (ref 0.6–1.1)
Total Protein: 7.6 g/dL (ref 6.4–8.3)

## 2012-07-14 LAB — CBC WITH DIFFERENTIAL/PLATELET
Eosinophils Absolute: 0.1 10*3/uL (ref 0.0–0.5)
HCT: 30 % — ABNORMAL LOW (ref 34.8–46.6)
HGB: 10 g/dL — ABNORMAL LOW (ref 11.6–15.9)
LYMPH%: 40.8 % (ref 14.0–49.7)
MONO#: 1.1 10*3/uL — ABNORMAL HIGH (ref 0.1–0.9)
NEUT#: 1.3 10*3/uL — ABNORMAL LOW (ref 1.5–6.5)
Platelets: 241 10*3/uL (ref 145–400)
RBC: 3.49 10*6/uL — ABNORMAL LOW (ref 3.70–5.45)
WBC: 4.2 10*3/uL (ref 3.9–10.3)
nRBC: 0 % (ref 0–0)

## 2012-07-14 MED ORDER — SODIUM CHLORIDE 0.9 % IJ SOLN
10.0000 mL | INTRAMUSCULAR | Status: DC | PRN
  Filled 2012-07-14: qty 10

## 2012-07-14 MED ORDER — ONDANSETRON 16 MG/50ML IVPB (CHCC)
16.0000 mg | Freq: Once | INTRAVENOUS | Status: AC
  Administered 2012-07-14: 16 mg via INTRAVENOUS

## 2012-07-14 MED ORDER — SODIUM CHLORIDE 0.9 % IV SOLN
800.0000 mg/m2 | Freq: Once | INTRAVENOUS | Status: AC
  Administered 2012-07-14: 1330 mg via INTRAVENOUS
  Filled 2012-07-14: qty 35

## 2012-07-14 MED ORDER — DEXAMETHASONE SODIUM PHOSPHATE 4 MG/ML IJ SOLN
20.0000 mg | Freq: Once | INTRAMUSCULAR | Status: AC
  Administered 2012-07-14: 20 mg via INTRAVENOUS

## 2012-07-14 MED ORDER — HEPARIN SOD (PORK) LOCK FLUSH 100 UNIT/ML IV SOLN
500.0000 [IU] | Freq: Once | INTRAVENOUS | Status: DC | PRN
  Filled 2012-07-14: qty 5

## 2012-07-14 MED ORDER — SODIUM CHLORIDE 0.9 % IV SOLN
350.0000 mg | Freq: Once | INTRAVENOUS | Status: AC
  Administered 2012-07-14: 350 mg via INTRAVENOUS
  Filled 2012-07-14: qty 35

## 2012-07-14 MED ORDER — SODIUM CHLORIDE 0.9 % IV SOLN
Freq: Once | INTRAVENOUS | Status: AC
  Administered 2012-07-14: 15:00:00 via INTRAVENOUS

## 2012-07-14 NOTE — Patient Instructions (Signed)
[facility] Discharge Instructions for Patients Receiving Chemotherapy  Today you received the following chemotherapy agents Carbo/Gemzar To help prevent nausea and vomiting after your treatment, we encourage you to take your nausea medication as directed  If you develop nausea and vomiting that is not controlled by your nausea medication, call the clinic. If it is after clinic hours your family physician or the after hours number for the clinic or go to the Emergency Department.   BELOW ARE SYMPTOMS THAT SHOULD BE REPORTED IMMEDIATELY:  *FEVER GREATER THAN 100.5 F  *CHILLS WITH OR WITHOUT FEVER  NAUSEA AND VOMITING THAT IS NOT CONTROLLED WITH YOUR NAUSEA MEDICATION  *UNUSUAL SHORTNESS OF BREATH  *UNUSUAL BRUISING OR BLEEDING  TENDERNESS IN MOUTH AND THROAT WITH OR WITHOUT PRESENCE OF ULCERS  *URINARY PROBLEMS  *BOWEL PROBLEMS  UNUSUAL RASH Items with * indicate a potential emergency and should be followed up as soon as possible.  One of the nurses will contact you 24 hours after your treatment. Please let the nurse know about any problems that you may have experienced. Feel free to call the clinic you have any questions or concerns. The clinic phone number is [phone].   I have been informed and understand all the instructions given to me. I know to contact the clinic, my physician, or go to the Emergency Department if any problems should occur. I do not have any questions at this time, but understand that I may call the clinic during office hours   should I have any questions or need assistance in obtaining follow up care.    __________________________________________  _____________  __________ Signature of Patient or Authorized Representative            Date                   Time    __________________________________________ Nurse's Signature

## 2012-07-14 NOTE — Progress Notes (Signed)
Labs reviewed by MD- OK TO TREAT despite labs

## 2012-07-16 ENCOUNTER — Other Ambulatory Visit: Payer: Self-pay | Admitting: Certified Registered Nurse Anesthetist

## 2012-07-20 ENCOUNTER — Encounter: Payer: Self-pay | Admitting: Pharmacist

## 2012-07-21 ENCOUNTER — Ambulatory Visit: Payer: Medicare Other

## 2012-07-21 ENCOUNTER — Other Ambulatory Visit (HOSPITAL_BASED_OUTPATIENT_CLINIC_OR_DEPARTMENT_OTHER): Payer: Medicare Other | Admitting: Lab

## 2012-07-21 DIAGNOSIS — C3490 Malignant neoplasm of unspecified part of unspecified bronchus or lung: Secondary | ICD-10-CM | POA: Diagnosis not present

## 2012-07-21 LAB — CBC WITH DIFFERENTIAL/PLATELET
Basophils Absolute: 0 10*3/uL (ref 0.0–0.1)
Eosinophils Absolute: 0.1 10*3/uL (ref 0.0–0.5)
HCT: 27 % — ABNORMAL LOW (ref 34.8–46.6)
LYMPH%: 68.4 % — ABNORMAL HIGH (ref 14.0–49.7)
MCV: 84.9 fL (ref 79.5–101.0)
MONO%: 6.2 % (ref 0.0–14.0)
NEUT#: 0.5 10*3/uL — CL (ref 1.5–6.5)
NEUT%: 21.8 % — ABNORMAL LOW (ref 38.4–76.8)
Platelets: 171 10*3/uL (ref 145–400)
RBC: 3.18 10*6/uL — ABNORMAL LOW (ref 3.70–5.45)
nRBC: 0 % (ref 0–0)

## 2012-07-23 ENCOUNTER — Ambulatory Visit (INDEPENDENT_AMBULATORY_CARE_PROVIDER_SITE_OTHER): Payer: Medicare Other | Admitting: Internal Medicine

## 2012-07-23 ENCOUNTER — Encounter: Payer: Self-pay | Admitting: Internal Medicine

## 2012-07-23 VITALS — BP 102/72 | HR 98 | Temp 98.1°F | Ht 65.0 in | Wt 136.0 lb

## 2012-07-23 DIAGNOSIS — C3490 Malignant neoplasm of unspecified part of unspecified bronchus or lung: Secondary | ICD-10-CM | POA: Diagnosis not present

## 2012-07-23 DIAGNOSIS — Z23 Encounter for immunization: Secondary | ICD-10-CM | POA: Diagnosis not present

## 2012-07-23 DIAGNOSIS — J449 Chronic obstructive pulmonary disease, unspecified: Secondary | ICD-10-CM

## 2012-07-23 NOTE — Progress Notes (Signed)
Subjective:    Patient ID: Destiny Harrison, female    DOB: [birth year], 71 y.o.   MRN: [id number]  HPI #Ex-smoker: quit smoking about 2 years ago but had 20 pack smokng hx.    # RA - from age 60. Dr Dierdre Forth. Prednisone and Methotrexate  #COPD - moderate   -  PFTs - Gold stage 2 copd with asthma component. FEv1 1L/54%., 14% BD response on FVC, ratio 54, DLCO 42%  #NSCLC - diagnosed and  metastatic 2013 with rib lesion   - CT Chest 02/20/12 Left upper lobe endobronchial mass with hypermetabolic adenopathy extending to the contralateral mediastinum and deep to the right clavicular head, all consistent with primary bronchogenic carcinoma. Left eleventh rib lesion is highly worrisome for a metastatic lesion.    - status post palliative radiotherapy to the left lung mass and currently (as of 02/20/12)  undergoing systemic chemotherapy with carboplatin and Abraxane status post 1 dose.   based on bronchoscopy  1. - Status post palliative radiotherapy to the left lung mass under the care of Dr. Michell Heinrich completed on 03/06/2012.  2. Systemic chemotherapy with carboplatin for AUC of 6 on day 1 and Abraxane 100 mg/M2 on days 1, 8 and 15 every 3 weeks. Status post 2 cycles  From cycle 3 forward AUC will be decreased to 4.5 given on day 1 and the Abraxane will be decreased to 90 mg per meter squared on days 1, 8 and 15 every 3 weeks, Status post a total of 3 cycles  CURRENT THERAPY: Systemic chemotherapy with carboplatin for AUC of 5 on day 1 and gemcitabine 1000 mg/m2 given on day 1 and day 8 every 3 weeks,status post 1 cycle. Due to significant neutropenia she will be dosed reduced beginning cycle 2 forward to carboplatin at an AUC of 4 given on day 1 and gemcitabine at 800 mg per meter squared given on days 1 and 8 every 3 weeks. Status post 2 cycles  The patient has mixed response to this treatment with decrease in the size of the left perihilar lesion but progression of the mediastinal nodal metastasis. She  is now being treated with systemic chemotherapy in the form of carboplatin for an AUC of 5 given on day 1 and gemcitabine 1000 mg read square given on days 1 and 8 every 3 weeks status post 1 cycle. She did however continue to have some neutropenia even with this chemotherapy regimen and therefore will be dose reduced to carboplatin for an AUC of 4 given on day 1 and gemcitabine at 800 mg read square given on days 1 and 8 every 3 weeks from cycle to forward. Status post a total of 2 cycles of chemotherapy with this regimen. Patient was discussed with Dr. Arbutus Ped. Her ANC is subtherapeutic, at 0.8, to proceed with cycle #3 as scheduled today. She will again be treated with Neupogen at 300 mcg subcutaneously for 3 days to address her neutropenia. Neutropenic precautions were discussed and the patient and her son both voiced understanding. We'll reschedule the start of cycle 3 to begin in one week pending adequate counts. She will followup with Dr. Arbutus Ped in 4 weeks prior to her next scheduled cycle of chemotherapy with a repeat CBC differential C. met, and CT the chest abdomen and pelvis with contrast to reevaluate her disease. To address her cough, she was given a prescription for Hycodan cough syrup.     OV 03/24/2012  Followup COPD moderate disease. IN interim since last visit diagnosed with  NSCLC with mets to rib. Undergoing chemo but last cycle and today's cycle witheld due to low wbc. She is upset that her cancer Rx is getting delayed. In terms of copd, feeling baseline. In fact, dyspnea improved. She had some worsening cough and some hemoptysis last week but this is under control with hycodan. SHe denies fever, cold, sputum, edema, wheeze or even fatigue different from baseline.  She is gaining weight of note with cancer rX. HEr main other concern is cost of Rx and psychosocial stress  THough stable CAT copd score is 31 and reflects high symptom burden     Past, Family, Social reviewed:as above +  lot of $ stress and emotional stres due to illness and dealing with son who has ESRD and is on transplant list   #COPD  - you have copd that is moderate and is stable  - stop spiriva and symbicort  - instead start albuterol nebulizer 4 times daily + atrovent nebulizer 4 times daily scheduled  - use qvar inhaler 2 puff twice daily scheduled - take some samples  - generic ventolin inhaler as needed  - - If the above is not resulting in lower costs, let me know asap  - will discuss rehab at followup  #Lung Cancer  - per Dr Arbutus Ped  #followup  In oct 2013 with CAT score at followup  please call us or let us know anytime if you are not feeling well   OV 07/23/2012  COPD : doing well. Needs flu shot today 07/23/2012. CAT score is 29 and slightly better to no change from baseline. ECOG is 0 to 1. Uses nebs regularly. No change in dyspnea or baseline cough. Never been to rehab but is willing. Confused about meds - doing atrovent tid and qvar prn      CAT COPD Symptom and Quality of Life Score ([employer] trademark)  June 2013 07/23/2012   Never Cough -> Cough all the time 4 5  No phlegm in chest -> Chest is full of phlegm 4 0  No chest tightness -> Chest feels very tight 4 4  No dyspnea for 1 flight stairs/hill -> Very dyspneic for 1 flight of stairs 4 5  No limitations for ADL at home -> Very limited with ADL at home 3 5  Confident leaving home -> Not at all confident leaving home 3 2  Sleep soundly -> Do not sleep soundly because of lung condition 5 5  Lots of Energy -> No energy at all 4 3  TOTAL Score (max 40)  31 29    [place] Symp Assessment Scale ESAS. 0 to 10 07/23/2012   No Pain -> Worst pain 8  No Tiredness -> Worst tiredness 6  No Nausea -> Worst nausea 0  No Depression - Worst depression 0  No Anxiety -> Worst anxiety 0  No Drowsiness -> Worst drowsiness 0  Best -> Worst appetite 5  Best wellbeing -> worst wellbeing 2  No dyspnea -> Worst dyspnea 8    Other problem 0    Past, Family, Social reviewed: cancer progressive per onc notes in mediastinum and problems with neutropenia and dose reductions and chemo hold. Repeat scan due in few days to weeks. Dr Arbutus Ped folowing   Review of Systems  Constitutional: Negative for fever and unexpected weight change.  HENT: Negative for ear pain, nosebleeds, congestion, sore throat, rhinorrhea, sneezing, trouble swallowing, dental problem, postnasal drip and sinus pressure.   Eyes: Negative for redness  and itching.  Respiratory: Positive for cough and shortness of breath. Negative for chest tightness and wheezing.   Cardiovascular: Negative for palpitations and leg swelling.  Gastrointestinal: Negative for nausea and vomiting.  Genitourinary: Negative for dysuria.  Musculoskeletal: Negative for joint swelling.  Skin: Negative for rash.  Neurological: Negative for headaches.  Hematological: Does not bruise/bleed easily.  Psychiatric/Behavioral: Negative for dysphoric mood. The patient is not nervous/anxious.        Objective:   Physical Exam Vitals reviewed. Constitutional: She is oriented to person, place, and time. She appears well-developed and well-nourished. No distress.  Body mass index is 20.43 kg/(m^2). 11/18/2011 Body mass index is 23.22 kg/(m^2). - June 2013 Body mass index is 22.63 kg/(m^2). - 07/23/2012       HENT:  Head: Normocephalic and atraumatic.  Right Ear: External ear normal.  Left Ear: External ear normal.  Mouth/Throat: Oropharynx is clear and moist. No oropharyngeal exudate.  Eyes: Conjunctivae and EOM are normal. Pupils are equal, round, and reactive to light. Right eye exhibits no discharge. Left eye exhibits no discharge. No scleral icterus.  Neck: Normal range of motion. Neck supple. No JVD present. No tracheal deviation present. No thyromegaly present.  Cardiovascular: Normal rate, regular rhythm, normal heart sounds and intact distal pulses.  Exam reveals no  gallop and no friction rub.   No murmur heard. Pulmonary/Chest: Effort normal and breath sounds normal. No respiratory distress. She has no wheezes. She has no rales. She exhibits no tenderness.  Abdominal: Soft. Bowel sounds are normal. She exhibits no distension and no mass. There is no tenderness. There is no rebound and no guarding.  Musculoskeletal: Normal range of motion. She exhibits no edema and no tenderness.       RA+  Lymphadenopathy:    She has no cervical adenopathy.  Neurological: She is alert and oriented to person, place, and time. She has normal reflexes. No cranial nerve deficit. She exhibits normal muscle tone. Coordination normal.  Skin: Skin is warm and dry. No rash noted. She is not diaphoretic. No erythema. No pallor.  Psychiatric: She has a normal mood and affect. Her behavior is normal. Judgment and thought content normal.            Assessment & Plan:

## 2012-07-23 NOTE — Patient Instructions (Addendum)
#  COPD  continue medications but you are not doing them right - see Tammy Parrett for med calendar  have flu shot today 07/23/2012 Any problems come sooner Referring to pulmonary rehab  #Followup NP Tammy for med calendar and education REturn to see me in 3  months  

## 2012-07-27 ENCOUNTER — Telehealth: Payer: Self-pay | Admitting: Internal Medicine

## 2012-07-27 NOTE — Telephone Encounter (Signed)
s/w pt and moved her appt to the pm on 10/15 due to mkm being on pal in the am      aom

## 2012-07-28 ENCOUNTER — Other Ambulatory Visit (HOSPITAL_BASED_OUTPATIENT_CLINIC_OR_DEPARTMENT_OTHER): Payer: Medicare Other | Admitting: Lab

## 2012-07-28 DIAGNOSIS — C3490 Malignant neoplasm of unspecified part of unspecified bronchus or lung: Secondary | ICD-10-CM | POA: Diagnosis not present

## 2012-07-28 LAB — COMPREHENSIVE METABOLIC PANEL (CC13)
ALT: 10 U/L (ref 0–55)
AST: 18 U/L (ref 5–34)
Chloride: 104 mEq/L (ref 98–107)
Creatinine: 0.7 mg/dL (ref 0.6–1.1)
Sodium: 141 mEq/L (ref 136–145)
Total Bilirubin: 0.8 mg/dL (ref 0.20–1.20)
Total Protein: 7.1 g/dL (ref 6.4–8.3)

## 2012-07-28 LAB — CBC WITH DIFFERENTIAL/PLATELET
BASO%: 0.3 % (ref 0.0–2.0)
EOS%: 2.6 % (ref 0.0–7.0)
HCT: 26.9 % — ABNORMAL LOW (ref 34.8–46.6)
LYMPH%: 51.6 % — ABNORMAL HIGH (ref 14.0–49.7)
MCH: 29.2 pg (ref 25.1–34.0)
MCHC: 33.8 g/dL (ref 31.5–36.0)
MONO#: 0.5 10*3/uL (ref 0.1–0.9)
NEUT%: 29.5 % — ABNORMAL LOW (ref 38.4–76.8)
RBC: 3.12 10*6/uL — ABNORMAL LOW (ref 3.70–5.45)
WBC: 3.1 10*3/uL — ABNORMAL LOW (ref 3.9–10.3)
lymph#: 1.6 10*3/uL (ref 0.9–3.3)

## 2012-07-30 ENCOUNTER — Ambulatory Visit (HOSPITAL_COMMUNITY)
Admission: RE | Admit: 2012-07-30 | Discharge: 2012-07-30 | Disposition: A | Discharge status: Home / Self Care | Payer: Medicare Other | Source: Ambulatory Visit | Attending: Physician Assistant | Admitting: Physician Assistant

## 2012-07-30 DIAGNOSIS — Z923 Personal history of irradiation: Secondary | ICD-10-CM | POA: Insufficient documentation

## 2012-07-30 DIAGNOSIS — C3490 Malignant neoplasm of unspecified part of unspecified bronchus or lung: Secondary | ICD-10-CM | POA: Insufficient documentation

## 2012-07-30 DIAGNOSIS — Z79899 Other long term (current) drug therapy: Secondary | ICD-10-CM | POA: Insufficient documentation

## 2012-07-30 DIAGNOSIS — E042 Nontoxic multinodular goiter: Secondary | ICD-10-CM | POA: Insufficient documentation

## 2012-07-30 DIAGNOSIS — M889 Osteitis deformans of unspecified bone: Secondary | ICD-10-CM | POA: Diagnosis not present

## 2012-07-30 MED ORDER — IOHEXOL 300 MG/ML  SOLN
100.0000 mL | Freq: Once | INTRAMUSCULAR | Status: AC | PRN
  Administered 2012-07-30: 100 mL via INTRAVENOUS

## 2012-08-02 NOTE — Assessment & Plan Note (Signed)
#  COPD  continue medications but you are not doing them right - see Tammy Parrett for med calendar  have flu shot today 07/23/2012 Any problems come sooner Referring to pulmonary rehab  #Followup NP Tammy for med calendar and education REturn to see me in 3  months

## 2012-08-04 ENCOUNTER — Other Ambulatory Visit (HOSPITAL_BASED_OUTPATIENT_CLINIC_OR_DEPARTMENT_OTHER): Payer: Medicare Other | Admitting: Lab

## 2012-08-04 ENCOUNTER — Ambulatory Visit (HOSPITAL_BASED_OUTPATIENT_CLINIC_OR_DEPARTMENT_OTHER): Payer: Medicare Other | Admitting: Internal Medicine

## 2012-08-04 ENCOUNTER — Ambulatory Visit: Payer: Medicare Other

## 2012-08-04 VITALS — BP 146/76 | HR 94 | Temp 97.0°F | Resp 20 | Ht 65.0 in | Wt 132.6 lb

## 2012-08-04 DIAGNOSIS — C7952 Secondary malignant neoplasm of bone marrow: Secondary | ICD-10-CM

## 2012-08-04 DIAGNOSIS — D702 Other drug-induced agranulocytosis: Secondary | ICD-10-CM

## 2012-08-04 DIAGNOSIS — C3490 Malignant neoplasm of unspecified part of unspecified bronchus or lung: Secondary | ICD-10-CM | POA: Diagnosis not present

## 2012-08-04 DIAGNOSIS — C3410 Malignant neoplasm of upper lobe, unspecified bronchus or lung: Secondary | ICD-10-CM | POA: Diagnosis not present

## 2012-08-04 DIAGNOSIS — C7951 Secondary malignant neoplasm of bone: Secondary | ICD-10-CM | POA: Diagnosis not present

## 2012-08-04 LAB — CBC WITH DIFFERENTIAL/PLATELET
Basophils Absolute: 0 10*3/uL (ref 0.0–0.1)
Eosinophils Absolute: 0.1 10*3/uL (ref 0.0–0.5)
HGB: 9.3 g/dL — ABNORMAL LOW (ref 11.6–15.9)
MCV: 85.6 fL (ref 79.5–101.0)
MONO#: 0.5 10*3/uL (ref 0.1–0.9)
MONO%: 17.6 % — ABNORMAL HIGH (ref 0.0–14.0)
NEUT#: 0.6 10*3/uL — ABNORMAL LOW (ref 1.5–6.5)
RBC: 3.26 10*6/uL — ABNORMAL LOW (ref 3.70–5.45)
RDW: 16.6 % — ABNORMAL HIGH (ref 11.2–14.5)
WBC: 3 10*3/uL — ABNORMAL LOW (ref 3.9–10.3)
nRBC: 0 % (ref 0–0)

## 2012-08-04 MED ORDER — FILGRASTIM 300 MCG/0.5ML IJ SOLN
300.0000 ug | Freq: Once | INTRAMUSCULAR | Status: AC
  Administered 2012-08-04: 300 ug via SUBCUTANEOUS
  Filled 2012-08-04: qty 0.5

## 2012-08-04 NOTE — Progress Notes (Signed)
[facility] [phone]   [phone]  OFFICE PROGRESS NOTE  [name], MD [address] [place] [address]  DIAGNOSIS: Metastatic non-small cell lung cancer, squamous cell carcinoma diagnosed in March of 2013.   PRIOR THERAPY:  1. Status post palliative radiotherapy to the left lung mass under the care of Dr. Michell Heinrich completed on 03/06/2012.  2. Systemic chemotherapy with carboplatin for AUC of 6 on day 1 and Abraxane 100 mg/M2 on days 1, 8 and 15 every 3 weeks. Status post 2 cycles  From cycle 3 forward AUC will be decreased to 4.5 given on day 1 and the Abraxane will be decreased to 90 mg per meter squared on days 1, 8 and 15 every 3 weeks, Status post a total of 3 cycles.  CURRENT THERAPY: Systemic chemotherapy with carboplatin for AUC of 5 on day 1 and gemcitabine 1000 mg/m2 given on day 1 and day 8 every 3 weeks,status post 1 cycle. Due to significant neutropenia she will be dosed reduced beginning cycle 2 forward to carboplatin at an AUC of 4 given on day 1 and gemcitabine at 800 mg per meter squared given on days 1 and 8 every 3 weeks. Status post 3 cycles.   INTERVAL HISTORY: Destiny Harrison 71 y.o. female returns to the clinic today for followup visit accompanied by her son. The patient is feeling fine today with no specific complaints. She denied having any significant chest pain but continues to have shortness breath with exertion, no cough or hemoptysis. The patient denied having any significant weight loss or night sweats. She has no bleeding issues. She tolerated the last 3 cycles of chemotherapy was carboplatin and gemcitabine fairly well except for the persistent neutropenia. She had several days of her chemotherapy because of the neutropenia. The patient had repeat CT scan of the chest, abdomen and pelvis performed recently and she is here today for evaluation and discussion of her scan results.Marland Kitchen  MEDICAL HISTORY: Past Medical  History  Diagnosis Date  . COPD (chronic obstructive pulmonary disease)   . Cancer   . Neutropenia, drug-induced 05/05/2012  . Neutropenia, drug-induced 05/05/2012    ALLERGIES:   has no known allergies.  MEDICATIONS:  Current Outpatient Prescriptions  Medication Sig Dispense Refill  . albuterol (PROVENTIL HFA;VENTOLIN HFA) 108 (90 BASE) MCG/ACT inhaler Inhale 2 puffs into the lungs every 6 (six) hours as needed. WHEEZING AND SHORTNESS OF BREATH      . albuterol (PROVENTIL) (2.5 MG/3ML) 0.083% nebulizer solution Take 3 mLs (2.5 mg total) by nebulization every 4 (four) hours as needed for wheezing.  75 mL  12  . beclomethasone (QVAR) 80 MCG/ACT inhaler Inhale 2 puffs into the lungs 2 (two) times daily.      . calcium-vitamin D (OSCAL WITH D) 500-200 MG-UNIT per tablet Take 1 tablet by mouth daily.      . folic acid (FOLVITE) 400 MCG tablet Take 400 mcg by mouth daily.      Marland Kitchen HYDROcodone-acetaminophen (VICODIN) 5-500 MG per tablet Take 1 tablet by mouth every 4 to 6 hours as needed for pain  60 tablet  0  . HYDROcodone-homatropine (HYCODAN) 5-1.5 MG/5ML syrup Take 5 mLs by mouth every 6 (six) hours as needed for cough.  480 mL  0  . ipratropium (ATROVENT) 0.02 % nebulizer solution Take 2.5 mLs (500 mcg total) by nebulization 4 (four) times daily.  75 mL  12  . lidocaine-prilocaine (EMLA) cream Apply topically as  needed.  30 g  0  . predniSONE (DELTASONE) 5 MG tablet Take 5 mg by mouth daily.        . simvastatin (ZOCOR) 5 MG tablet Take 5 mg by mouth at bedtime.       Current Facility-Administered Medications  Medication Dose Route Frequency Provider Last Rate Last Dose  . filgrastim (NEUPOGEN) injection 300 mcg  300 mcg Subcutaneous Once Si Gaul, MD   300 mcg at 08/04/12 1544    SURGICAL HISTORY:  Past Surgical History  Procedure Date  . Appendex 1962  . Video bronchoscopy 01/28/2012    Procedure: VIDEO BRONCHOSCOPY WITHOUT FLUORO;  Surgeon: Kalman Shan, MD;  Location: [facility]  ENDOSCOPY;  Service: Endoscopy;  Laterality: Bilateral;  . Surgery on right wrist     REVIEW OF SYSTEMS:  A comprehensive review of systems was negative except for: Constitutional: positive for fatigue   PHYSICAL EXAMINATION: General appearance: alert, cooperative and no distress Head: Normocephalic, without obvious abnormality, atraumatic Neck: no adenopathy Lymph nodes: Cervical, supraclavicular, and axillary nodes normal. Resp: clear to auscultation bilaterally Cardio: regular rate and rhythm, S1, S2 normal, no murmur, click, rub or gallop GI: soft, non-tender; bowel sounds normal; no masses,  no organomegaly Extremities: extremities normal, atraumatic, no cyanosis or edema Neurologic: Alert and oriented X 3, normal strength and tone. Normal symmetric reflexes. Normal coordination and gait  ECOG PERFORMANCE STATUS: 1 - Symptomatic but completely ambulatory  Blood pressure 146/76, pulse 94, temperature 97 F (36.1 C), temperature source Oral, resp. rate 20, height 5\' 5"  (1.651 m), weight 132 lb 9.6 oz (60.147 kg).  LABORATORY DATA: Lab Results  Component Value Date   WBC 3.0* 08/04/2012   HGB 9.3* 08/04/2012   HCT 27.9* 08/04/2012   MCV 85.6 08/04/2012   PLT 329 08/04/2012      Chemistry      Component Value Date/Time   NA 141 07/28/2012 1405   NA 139 06/02/2012 1043   K 3.6 07/28/2012 1405   K 3.9 06/02/2012 1043   CL 104 07/28/2012 1405   CL 103 06/02/2012 1043   CO2 26 07/28/2012 1405   CO2 27 06/02/2012 1043   BUN 11.0 07/28/2012 1405   BUN 8 06/02/2012 1043   CREATININE 0.7 07/28/2012 1405   CREATININE 0.67 06/02/2012 1043      Component Value Date/Time   CALCIUM 9.0 07/28/2012 1405   CALCIUM 9.1 06/02/2012 1043   ALKPHOS 72 07/28/2012 1405   ALKPHOS 68 06/02/2012 1043   AST 18 07/28/2012 1405   AST 20 06/02/2012 1043   ALT 10 07/28/2012 1405   ALT 14 06/02/2012 1043   BILITOT 0.80 07/28/2012 1405   BILITOT 0.7 06/02/2012 1043       RADIOGRAPHIC STUDIES: Ct Chest W  Contrast  07/30/2012  *RADIOLOGY REPORT*  Clinical Data:  Lung cancer diagnosed in 2012, radiation complete. Chemotherapy ongoing.  CT CHEST, ABDOMEN AND PELVIS WITH CONTRAST  Technique:  Multidetector CT imaging of the chest, abdomen and pelvis was performed following the standard protocol during bolus administration of intravenous contrast.  Contrast: OMNIPAQUE IOHEXOL 300 MG/ML  SOLN  Comparison:  CT 05/07/2012, PET CT 02/20/2012   CT CHEST  Findings:  There is a port in the right anterior chest wall.  No axillary lymphadenopathy.  There is a 16 mm right supraclavicular lymph node which is similar to 15 mm on prior.  Small thyroid nodules unchanged.  The mediastinum conglomerate of right paratracheal lymph nodes measures 23 x 17 mm  which is slightly decreased from 24 x 19 mm on prior.  No hilar adenopathy.  No pericardial fluid.  Esophagus is normal.  Review of the lung parenchyma demonstrates central lobular emphysema.  There is linear scarring and pleural thickening at the lung apices which is unchanged.  The peribronchial thickening in the left superior hilum surrounding the left upper lobe bronchus is stable.  This site of prior mass.  IMPRESSION:  1.  Slight decrease in size of right paratracheal nodal conglomerate. 2.  Stable right supraclavicular lymph node. 3.  No evidence of disease progression.  Stable left hilar peribronchial thickening.  4.  Emphysematous change.   CT ABDOMEN AND PELVIS  Findings:  No focal hepatic lesion.  The gallbladder, pancreas, spleen, adrenal glands are normal.  Kidneys are normal.  Stomach, small bowel, and colon are unremarkable.  Abdominal aorta is normal caliber.  No retroperitoneal periportal lymphadenopathy.  No peritoneal disease.  The bladder and uterus are normal, ovaries are normal.  No pelvic lymphadenopathy. Review of  bone windows demonstrates no aggressive osseous lesions.  There is increased trabeculation in the right proximal femur suggesting Paget's  disease.  IMPRESSION: 1.  No evidence of abdominal or pelvic metastasis. 2.  Paget's disease of the right hip.   Original Report Authenticated By: Genevive Bi, M.D.     ASSESSMENT: This is a very pleasant 71 years old African American female with metastatic non-small cell lung cancer, adenocarcinoma currently on treatment with systemic chemotherapy with carboplatin and gemcitabine, status post 3 cycles with several days of her chemotherapy secondary to neutropenia. The patient has some improvement in her disease after the first 3 cycles of the chemotherapy with no evidence for disease progression. Her absolute neutrophil count is 600 today.  PLAN: I discussed the scan and lab result with the patient. I recommended for her to continue on the same chemotherapy regimen with reduced dose carboplatin and gemcitabine.  I will delay the start of cycle #42 in next week. For the neutropenia, I would start the patient on Neupogen 300 mcg subcutaneously for the next 3 days. I will also arrange for the patient to have Neulasta on day 9 of every cycle with the hope to prevent any further delay in her chemotherapy. The patient would come back for followup visit in 4 weeks with the start of cycle #5. She was advised to call immediately if she has any concerning symptoms in the interval.  All questions were answered. The patient knows to call the clinic with any problems, questions or concerns. We can certainly see the patient much sooner if necessary.  I spent 15 minutes counseling the patient face to face. The total time spent in the appointment was 25 minutes.

## 2012-08-04 NOTE — Patient Instructions (Signed)
Your CT scan showed no evidence for disease progression. We'll continue treatment with carboplatin and gemcitabine. I will arrange for you to have Neulasta injection on day 9 of every cycle.

## 2012-08-05 ENCOUNTER — Telehealth: Payer: Self-pay | Admitting: *Deleted

## 2012-08-05 ENCOUNTER — Ambulatory Visit (HOSPITAL_BASED_OUTPATIENT_CLINIC_OR_DEPARTMENT_OTHER): Payer: Medicare Other

## 2012-08-05 ENCOUNTER — Telehealth: Payer: Self-pay | Admitting: Internal Medicine

## 2012-08-05 VITALS — BP 114/70 | HR 106 | Temp 97.2°F

## 2012-08-05 DIAGNOSIS — T50904A Poisoning by unspecified drugs, medicaments and biological substances, undetermined, initial encounter: Secondary | ICD-10-CM | POA: Diagnosis not present

## 2012-08-05 DIAGNOSIS — D702 Other drug-induced agranulocytosis: Secondary | ICD-10-CM

## 2012-08-05 MED ORDER — FILGRASTIM 300 MCG/0.5ML IJ SOLN
300.0000 ug | Freq: Once | INTRAMUSCULAR | Status: AC
  Administered 2012-08-05: 300 ug via SUBCUTANEOUS
  Filled 2012-08-05: qty 0.5

## 2012-08-05 NOTE — Telephone Encounter (Signed)
Per staff message and POF I have scheduled appts.  JMW  

## 2012-08-05 NOTE — Telephone Encounter (Signed)
PT AWARE THAT I WILL GET A SCH TO HER ON 10/17 OR 10/21,EMAIL TO MW TO ADD TX   AOM

## 2012-08-06 ENCOUNTER — Ambulatory Visit (INDEPENDENT_AMBULATORY_CARE_PROVIDER_SITE_OTHER): Payer: Medicare Other | Admitting: Adult Health

## 2012-08-06 ENCOUNTER — Ambulatory Visit (HOSPITAL_BASED_OUTPATIENT_CLINIC_OR_DEPARTMENT_OTHER): Payer: Medicare Other

## 2012-08-06 ENCOUNTER — Encounter: Payer: Self-pay | Admitting: Adult Health

## 2012-08-06 VITALS — BP 118/60 | HR 105 | Temp 97.1°F | Ht 65.0 in | Wt 134.8 lb

## 2012-08-06 VITALS — BP 118/59 | HR 121 | Temp 97.9°F

## 2012-08-06 DIAGNOSIS — J449 Chronic obstructive pulmonary disease, unspecified: Secondary | ICD-10-CM | POA: Diagnosis not present

## 2012-08-06 DIAGNOSIS — D702 Other drug-induced agranulocytosis: Secondary | ICD-10-CM | POA: Diagnosis not present

## 2012-08-06 MED ORDER — FILGRASTIM 300 MCG/0.5ML IJ SOLN
300.0000 ug | Freq: Once | INTRAMUSCULAR | Status: AC
  Administered 2012-08-06: 300 ug via SUBCUTANEOUS
  Filled 2012-08-06: qty 0.5

## 2012-08-06 NOTE — Progress Notes (Signed)
Subjective:    Patient ID: Destiny Harrison, female    DOB: 1941-07-20, 71 y.o.   MRN: [id number] 71 yo female former smoker with known hx of p COPD moderate disease. And NSCLC  with mets to rib undergoing Chemo   HPI #Ex-smoker: quit smoking about 2 years ago but had 20 pack smokng hx.    # RA - from age 1. Dr Dierdre Forth. Prednisone and Methotrexate  #COPD - moderate   -  PFTs - Gold stage 2 copd with asthma component. FEv1 1L/54%., 14% BD response on FVC, ratio 54, DLCO 42%  #NSCLC - diagnosed and  metastatic 2013 with rib lesion   - CT Chest 02/20/12 Left upper lobe endobronchial mass with hypermetabolic adenopathy extending to the contralateral mediastinum and deep to the right clavicular head, all consistent with primary bronchogenic carcinoma. Left eleventh rib lesion is highly worrisome for a metastatic lesion.    - status post palliative radiotherapy to the left lung mass and currently (as of 02/20/12)  undergoing systemic chemotherapy with carboplatin and Abraxane status post 1 dose.   based on bronchoscopy  1. - Status post palliative radiotherapy to the left lung mass under the care of Dr. Michell Heinrich completed on 03/06/2012.  2. Systemic chemotherapy with carboplatin for AUC of 6 on day 1 and Abraxane 100 mg/M2 on days 1, 8 and 15 every 3 weeks. Status post 2 cycles  From cycle 3 forward AUC will be decreased to 4.5 given on day 1 and the Abraxane will be decreased to 90 mg per meter squared on days 1, 8 and 15 every 3 weeks, Status post a total of 3 cycles  CURRENT THERAPY: Systemic chemotherapy with carboplatin for AUC of 5 on day 1 and gemcitabine 1000 mg/m2 given on day 1 and day 8 every 3 weeks,status post 1 cycle. Due to significant neutropenia she will be dosed reduced beginning cycle 2 forward to carboplatin at an AUC of 4 given on day 1 and gemcitabine at 800 mg per meter squared given on days 1 and 8 every 3 weeks. Status post 2 cycles  The patient has mixed response to this  treatment with decrease in the size of the left perihilar lesion but progression of the mediastinal nodal metastasis. She is now being treated with systemic chemotherapy in the form of carboplatin for an AUC of 5 given on day 1 and gemcitabine 1000 mg read square given on days 1 and 8 every 3 weeks status post 1 cycle. She did however continue to have some neutropenia even with this chemotherapy regimen and therefore will be dose reduced to carboplatin for an AUC of 4 given on day 1 and gemcitabine at 800 mg read square given on days 1 and 8 every 3 weeks from cycle to forward. Status post a total of 2 cycles of chemotherapy with this regimen. Patient was discussed with Dr. Arbutus Ped. Her ANC is subtherapeutic, at 0.8, to proceed with cycle #3 as scheduled today. She will again be treated with Neupogen at 300 mcg subcutaneously for 3 days to address her neutropenia. Neutropenic precautions were discussed and the patient and her son both voiced understanding. We'll reschedule the start of cycle 3 to begin in one week pending adequate counts. She will followup with Dr. Arbutus Ped in 4 weeks prior to her next scheduled cycle of chemotherapy with a repeat CBC differential C. met, and CT the chest abdomen and pelvis with contrast to reevaluate her disease. To address her cough, she was given a  prescription for Hycodan cough syrup.     OV 03/24/2012  Followup COPD moderate disease. IN interim since last visit diagnosed with NSCLC with mets to rib. Undergoing chemo but last cycle and today's cycle witheld due to low wbc. She is upset that her cancer Rx is getting delayed. In terms of copd, feeling baseline. In fact, dyspnea improved. She had some worsening cough and some hemoptysis last week but this is under control with hycodan. SHe denies fever, cold, sputum, edema, wheeze or even fatigue different from baseline.  She is gaining weight of note with cancer rX. HEr main other concern is cost of Rx and psychosocial  stress  THough stable CAT copd score is 31 and reflects high symptom burden  Past, Family, Social reviewed:as above + lot of $ stress and emotional stres due to illness and dealing with son who has ESRD and is on transplant list   #COPD  - you have copd that is moderate and is stable  - stop spiriva and symbicort  - instead start albuterol nebulizer 4 times daily + atrovent nebulizer 4 times daily scheduled  - use qvar inhaler 2 puff twice daily scheduled - take some samples  - generic ventolin inhaler as needed  - - If the above is not resulting in lower costs, let me know asap  - will discuss rehab at followup  #Lung Cancer  - per Dr Arbutus Ped  #followup  In oct 2013 with CAT score at followup  please call us or let us know anytime if you are not feeling well   OV 07/23/2012  COPD : doing well. Needs flu shot today 07/23/2012. CAT score is 29 and slightly better to no change from baseline. ECOG is 0 to 1. Uses nebs regularly. No change in dyspnea or baseline cough. Never been to rehab but is willing. Confused about meds - doing atrovent tid and qvar prn      CAT COPD Symptom and Quality of Life Score ([employer] trademark)  June 2013 07/23/2012   Never Cough -> Cough all the time 4 5  No phlegm in chest -> Chest is full of phlegm 4 0  No chest tightness -> Chest feels very tight 4 4  No dyspnea for 1 flight stairs/hill -> Very dyspneic for 1 flight of stairs 4 5  No limitations for ADL at home -> Very limited with ADL at home 3 5  Confident leaving home -> Not at all confident leaving home 3 2  Sleep soundly -> Do not sleep soundly because of lung condition 5 5  Lots of Energy -> No energy at all 4 3  TOTAL Score (max 40)  31 29    [place] Symp Assessment Scale ESAS. 0 to 10 07/23/2012   No Pain -> Worst pain 8  No Tiredness -> Worst tiredness 6  No Nausea -> Worst nausea 0  No Depression - Worst depression 0  No Anxiety -> Worst anxiety 0  No Drowsiness ->  Worst drowsiness 0  Best -> Worst appetite 5  Best wellbeing -> worst wellbeing 2  No dyspnea -> Worst dyspnea 8  Other problem 0    08/06/2012 Follow up and Med review  Patient returns today for a followup visit and medication review. We reviewed all her medications and organized them into a medication calendar with patient education. Has completed all XRT .  Currently undergoing chemo.  CT chest 07/30/12 >Slight decrease in size of right paratracheal nodal  conglomerate. 2. Stable right supraclavicular lymph node. 3. No evidence of disease progression. Stable left hilar peribronchial thickening. Dyspnea is at baseline .    Review of Systems  Constitutional:   No  weight loss, night sweats,  Fevers, chills,  +fatigue, or  lassitude.  HEENT:   No headaches,  Difficulty swallowing,  Tooth/dental problems, or  Sore throat,                No sneezing, itching, ear ache, nasal congestion, post nasal drip,   CV:  No chest pain,  Orthopnea, PND, swelling in lower extremities, anasarca, dizziness, palpitations, syncope.   GI  No heartburn, indigestion, abdominal pain, nausea, vomiting, diarrhea, change in bowel habits, loss of appetite, bloody stools.   Resp:    No coughing up of blood.   No chest wall deformity  Skin: no rash or lesions.  GU: no dysuria, change in color of urine, no urgency or frequency.  No flank pain, no hematuria   MS:  No joint pain or swelling.  No decreased range of motion.    Psych:  No change in mood or affect. No depression or anxiety.  No memory loss.          Objective:   Physical Exam GEN: A/Ox3; pleasant , NAD elderly   HEENT:  [place] [facility]/AT,  EACs-clear, TMs-wnl, NOSE-clear, THROAT-clear, no lesions, no postnasal drip or exudate noted.   NECK:  Supple w/ fair ROM; no JVD; normal carotid impulses w/o bruits; no thyromegaly or nodules palpated; no lymphadenopathy.  RESP  Coarse BS w/o, wheezes/ rales/ or rhonchi.no accessory muscle use, no dullness  to percussion  CARD:  RRR, no m/r/g  , no peripheral edema, pulses intact, no cyanosis or clubbing.  GI:   Soft & nt; nml bowel sounds; no organomegaly or masses detected.  Musco: Warm bil, no deformities or joint swelling noted.   Neuro: alert, no focal deficits noted.    Skin: Warm, no lesions or rashes           Assessment & Plan:

## 2012-08-06 NOTE — Assessment & Plan Note (Signed)
Compensated on present regimen Patient's medications were reviewed today and patient education was given. Computerized medication calendar was adjusted/completed  follow up Dr. Marchelle Gearing in 3 months and As needed

## 2012-08-06 NOTE — Patient Instructions (Addendum)
May add Delsym 2 tsp Twice daily  As needed  Cough  Follow med calendar closely and bring to each visit follow up Dr. Marchelle Gearing in 3 months and As needed

## 2012-08-11 ENCOUNTER — Other Ambulatory Visit (HOSPITAL_BASED_OUTPATIENT_CLINIC_OR_DEPARTMENT_OTHER): Payer: Medicare Other | Admitting: Lab

## 2012-08-11 ENCOUNTER — Other Ambulatory Visit: Payer: Medicare Other | Admitting: Lab

## 2012-08-11 ENCOUNTER — Ambulatory Visit (HOSPITAL_BASED_OUTPATIENT_CLINIC_OR_DEPARTMENT_OTHER): Payer: Medicare Other

## 2012-08-11 VITALS — BP 136/75 | HR 81 | Temp 99.2°F | Resp 20

## 2012-08-11 DIAGNOSIS — Z5111 Encounter for antineoplastic chemotherapy: Secondary | ICD-10-CM

## 2012-08-11 DIAGNOSIS — C3490 Malignant neoplasm of unspecified part of unspecified bronchus or lung: Secondary | ICD-10-CM

## 2012-08-11 LAB — CBC WITH DIFFERENTIAL/PLATELET
BASO%: 0.2 % (ref 0.0–2.0)
Basophils Absolute: 0 10*3/uL (ref 0.0–0.1)
EOS%: 1.7 % (ref 0.0–7.0)
HGB: 10.5 g/dL — ABNORMAL LOW (ref 11.6–15.9)
MCH: 28.5 pg (ref 25.1–34.0)
MCHC: 33.2 g/dL (ref 31.5–36.0)
MONO#: 1 10*3/uL — ABNORMAL HIGH (ref 0.1–0.9)
RDW: 16.7 % — ABNORMAL HIGH (ref 11.2–14.5)
WBC: 4.1 10*3/uL (ref 3.9–10.3)
lymph#: 1.6 10*3/uL (ref 0.9–3.3)

## 2012-08-11 LAB — COMPREHENSIVE METABOLIC PANEL (CC13)
AST: 17 U/L (ref 5–34)
Albumin: 3.7 g/dL (ref 3.5–5.0)
Alkaline Phosphatase: 86 U/L (ref 40–150)
Potassium: 3.8 mEq/L (ref 3.5–5.1)
Sodium: 138 mEq/L (ref 136–145)
Total Protein: 7.5 g/dL (ref 6.4–8.3)

## 2012-08-11 MED ORDER — SODIUM CHLORIDE 0.9 % IV SOLN
Freq: Once | INTRAVENOUS | Status: AC
  Administered 2012-08-11: 14:00:00 via INTRAVENOUS

## 2012-08-11 MED ORDER — SODIUM CHLORIDE 0.9 % IJ SOLN: 100.0000 ug | Freq: Once | INTRAVENOUS | Status: DC

## 2012-08-11 MED ORDER — HEPARIN SOD (PORK) LOCK FLUSH 100 UNIT/ML IV SOLN
500.0000 [IU] | Freq: Once | INTRAVENOUS | Status: AC | PRN
  Administered 2012-08-11: 500 [IU]
  Filled 2012-08-11: qty 5

## 2012-08-11 MED ORDER — SODIUM CHLORIDE 0.9 % IJ SOLN
10.0000 mL | INTRAMUSCULAR | Status: DC | PRN
  Administered 2012-08-11: 10 mL
  Filled 2012-08-11: qty 10

## 2012-08-11 MED ORDER — CARBOPLATIN CHEMO INJECTION 450 MG/45ML
389.6000 mg | Freq: Once | INTRAVENOUS | Status: AC
  Administered 2012-08-11: 390 mg via INTRAVENOUS
  Filled 2012-08-11: qty 39

## 2012-08-11 MED ORDER — DEXAMETHASONE SODIUM PHOSPHATE 4 MG/ML IJ SOLN
20.0000 mg | Freq: Once | INTRAMUSCULAR | Status: AC
  Administered 2012-08-11: 20 mg via INTRAVENOUS

## 2012-08-11 MED ORDER — ONDANSETRON 16 MG/50ML IVPB (CHCC)
16.0000 mg | Freq: Once | INTRAVENOUS | Status: AC
  Administered 2012-08-11: 16 mg via INTRAVENOUS

## 2012-08-11 MED ORDER — SODIUM CHLORIDE 0.9 % IV SOLN
800.0000 mg/m2 | Freq: Once | INTRAVENOUS | Status: AC
  Administered 2012-08-11: 1330 mg via INTRAVENOUS
  Filled 2012-08-11: qty 35

## 2012-08-11 MED ORDER — CARBOPLATIN CHEMO INTRADERMAL TEST DOSE 100MCG/0.02ML
100.0000 ug | Freq: Once | INTRADERMAL | Status: AC
  Administered 2012-08-11: 100 ug via INTRADERMAL
  Filled 2012-08-11: qty 0.01

## 2012-08-11 NOTE — Progress Notes (Signed)
Carbo skin test negative reaction.  dmr

## 2012-08-11 NOTE — Progress Notes (Signed)
Ok for tx with anc 1.4 per dr Arbutus Ped.  dmr

## 2012-08-11 NOTE — Patient Instructions (Addendum)
[facility] Discharge Instructions for Patients Receiving Chemotherapy  Today you received the following chemotherapy agents carboplatin/ gemzar  To help prevent nausea and vomiting after your treatment, we encourage you to take your nausea medication  and take it as often as prescribed   If you develop nausea and vomiting that is not controlled by your nausea medication, call the clinic. If it is after clinic hours your family physician or the after hours number for the clinic or go to the Emergency Department.   BELOW ARE SYMPTOMS THAT SHOULD BE REPORTED IMMEDIATELY:  *FEVER GREATER THAN 100.5 F  *CHILLS WITH OR WITHOUT FEVER  NAUSEA AND VOMITING THAT IS NOT CONTROLLED WITH YOUR NAUSEA MEDICATION  *UNUSUAL SHORTNESS OF BREATH  *UNUSUAL BRUISING OR BLEEDING  TENDERNESS IN MOUTH AND THROAT WITH OR WITHOUT PRESENCE OF ULCERS  *URINARY PROBLEMS  *BOWEL PROBLEMS  UNUSUAL RASH Items with * indicate a potential emergency and should be followed up as soon as possible.  One of the nurses will contact you 24 hours after your treatment. Please let the nurse know about any problems that you may have experienced. Feel free to call the clinic you have any questions or concerns. The clinic phone number is [phone].   I have been informed and understand all the instructions given to me. I know to contact the clinic, my physician, or go to the Emergency Department if any problems should occur. I do not have any questions at this time, but understand that I may call the clinic during office hours   should I have any questions or need assistance in obtaining follow up care.    __________________________________________  _____________  __________ Signature of Patient or Authorized Representative            Date                   Time    __________________________________________ Nurse's Signature    

## 2012-08-13 NOTE — Addendum Note (Signed)
Addended by: Boone Master E on: 08/13/2012 12:56 PM   Modules accepted: Orders

## 2012-08-18 ENCOUNTER — Ambulatory Visit: Payer: Medicare Other

## 2012-08-18 ENCOUNTER — Telehealth: Payer: Self-pay | Admitting: Internal Medicine

## 2012-08-18 ENCOUNTER — Ambulatory Visit (HOSPITAL_BASED_OUTPATIENT_CLINIC_OR_DEPARTMENT_OTHER): Payer: Medicare Other | Admitting: Lab

## 2012-08-18 ENCOUNTER — Other Ambulatory Visit: Payer: Self-pay | Admitting: *Deleted

## 2012-08-18 DIAGNOSIS — C3490 Malignant neoplasm of unspecified part of unspecified bronchus or lung: Secondary | ICD-10-CM

## 2012-08-18 LAB — CBC WITH DIFFERENTIAL/PLATELET
BASO%: 0.4 % (ref 0.0–2.0)
Basophils Absolute: 0 10*3/uL (ref 0.0–0.1)
HCT: 27.7 % — ABNORMAL LOW (ref 34.8–46.6)
HGB: 9.3 g/dL — ABNORMAL LOW (ref 11.6–15.9)
MONO#: 0.2 10*3/uL (ref 0.1–0.9)
NEUT%: 30.7 % — ABNORMAL LOW (ref 38.4–76.8)
WBC: 2.7 10*3/uL — ABNORMAL LOW (ref 3.9–10.3)
lymph#: 1.7 10*3/uL (ref 0.9–3.3)

## 2012-08-18 NOTE — Telephone Encounter (Signed)
appts made and note left for pt to p.u a sch on 10/30

## 2012-08-18 NOTE — Progress Notes (Signed)
Hold treatment due to ANC 0.8 per Dr. Arbutus Ped. Neutropenic precautions reviewed with the patient.

## 2012-08-19 ENCOUNTER — Other Ambulatory Visit: Payer: Self-pay | Admitting: Medical Oncology

## 2012-08-19 ENCOUNTER — Ambulatory Visit: Payer: Medicare Other

## 2012-08-25 ENCOUNTER — Ambulatory Visit: Payer: Medicare Other | Admitting: Nutrition

## 2012-08-25 ENCOUNTER — Other Ambulatory Visit (HOSPITAL_BASED_OUTPATIENT_CLINIC_OR_DEPARTMENT_OTHER): Payer: Medicare Other | Admitting: Lab

## 2012-08-25 DIAGNOSIS — C3490 Malignant neoplasm of unspecified part of unspecified bronchus or lung: Secondary | ICD-10-CM | POA: Diagnosis not present

## 2012-08-25 LAB — COMPREHENSIVE METABOLIC PANEL (CC13)
Alkaline Phosphatase: 83 U/L (ref 40–150)
BUN: 7 mg/dL (ref 7.0–26.0)
Glucose: 98 mg/dl (ref 70–99)
Total Bilirubin: 0.61 mg/dL (ref 0.20–1.20)

## 2012-08-25 LAB — CBC WITH DIFFERENTIAL/PLATELET
Basophils Absolute: 0 10*3/uL (ref 0.0–0.1)
Eosinophils Absolute: 0.1 10*3/uL (ref 0.0–0.5)
HGB: 10 g/dL — ABNORMAL LOW (ref 11.6–15.9)
LYMPH%: 49.6 % (ref 14.0–49.7)
MCH: 30.2 pg (ref 25.1–34.0)
MCV: 87.9 fL (ref 79.5–101.0)
MONO%: 18.1 % — ABNORMAL HIGH (ref 0.0–14.0)
NEUT#: 0.7 10*3/uL — ABNORMAL LOW (ref 1.5–6.5)
Platelets: 121 10*3/uL — ABNORMAL LOW (ref 145–400)
RBC: 3.32 10*6/uL — ABNORMAL LOW (ref 3.70–5.45)

## 2012-08-25 NOTE — Progress Notes (Signed)
Patient stopped by my office for brief followup. She reports that she is doing her best to continue to eat. She is requesting additional samples of boost along with coupons. Her weight is stable at 134.8 pounds.  I have enforced the importance of small, frequent meals utilizing higher calorie foods. I provided patient with some additional boost oral nutrition samples along with coupons for her to purchase. I've encouraged her to stay in touch in the me know how she's doing. I will followup with her on Tuesday, November 12 during chemotherapy.

## 2012-09-01 ENCOUNTER — Ambulatory Visit: Payer: Medicare Other

## 2012-09-01 ENCOUNTER — Ambulatory Visit (HOSPITAL_BASED_OUTPATIENT_CLINIC_OR_DEPARTMENT_OTHER): Payer: Medicare Other | Admitting: Physician Assistant

## 2012-09-01 ENCOUNTER — Ambulatory Visit: Payer: Medicare Other | Admitting: Nutrition

## 2012-09-01 ENCOUNTER — Ambulatory Visit (HOSPITAL_BASED_OUTPATIENT_CLINIC_OR_DEPARTMENT_OTHER): Payer: Medicare Other

## 2012-09-01 ENCOUNTER — Telehealth: Payer: Self-pay | Admitting: Internal Medicine

## 2012-09-01 ENCOUNTER — Other Ambulatory Visit (HOSPITAL_BASED_OUTPATIENT_CLINIC_OR_DEPARTMENT_OTHER): Payer: Medicare Other | Admitting: Lab

## 2012-09-01 VITALS — BP 143/57 | HR 95 | Temp 98.5°F | Resp 20 | Ht 65.0 in | Wt 132.7 lb

## 2012-09-01 DIAGNOSIS — T451X5A Adverse effect of antineoplastic and immunosuppressive drugs, initial encounter: Secondary | ICD-10-CM

## 2012-09-01 DIAGNOSIS — C3490 Malignant neoplasm of unspecified part of unspecified bronchus or lung: Secondary | ICD-10-CM

## 2012-09-01 DIAGNOSIS — D702 Other drug-induced agranulocytosis: Secondary | ICD-10-CM

## 2012-09-01 DIAGNOSIS — C3410 Malignant neoplasm of upper lobe, unspecified bronchus or lung: Secondary | ICD-10-CM | POA: Diagnosis not present

## 2012-09-01 LAB — CBC WITH DIFFERENTIAL/PLATELET
Basophils Absolute: 0 10*3/uL (ref 0.0–0.1)
Eosinophils Absolute: 0.1 10*3/uL (ref 0.0–0.5)
HCT: 29.5 % — ABNORMAL LOW (ref 34.8–46.6)
HGB: 10 g/dL — ABNORMAL LOW (ref 11.6–15.9)
LYMPH%: 59.7 % — ABNORMAL HIGH (ref 14.0–49.7)
MCV: 85.3 fL (ref 79.5–101.0)
MONO%: 19 % — ABNORMAL HIGH (ref 0.0–14.0)
NEUT#: 0.6 10*3/uL — ABNORMAL LOW (ref 1.5–6.5)
NEUT%: 19.4 % — ABNORMAL LOW (ref 38.4–76.8)
Platelets: 272 10*3/uL (ref 145–400)

## 2012-09-01 MED ORDER — FILGRASTIM 300 MCG/0.5ML IJ SOLN
300.0000 ug | Freq: Once | INTRAMUSCULAR | Status: AC
  Administered 2012-09-01: 300 ug via SUBCUTANEOUS
  Filled 2012-09-01: qty 0.5

## 2012-09-01 NOTE — Telephone Encounter (Signed)
gv pt appt schedule for November and December.  °

## 2012-09-01 NOTE — Progress Notes (Signed)
I spoke briefly to patient. She reports she is unable to receive her treatments today because her white cell count has not recovered. She is discouraged. Her weight has decreased to 132.7 pounds November 12 from 134.8 pounds October 17. She reports she does continue to eat without difficulty.  Nutrition diagnosis: Food and nutrition related knowledge deficit improved.  Intervention: I provided support and encouragement for patient to continue to eat protein foods in small amounts throughout the day. I've encouraged her to contact me if she has any questions or concerns regarding nutrition. She has my contact information.  Monitoring, evaluation, goals: The patient is tolerating oral intake however, her weight has dropped a few pounds.  Next visit: Tuesday, December 3, during treatment.

## 2012-09-01 NOTE — Patient Instructions (Addendum)
Your neutrophils are sub optimal to proceed with chemotherapy today You will receive 3 days of Neupogen injections You will return on Friday 09/04/12 to recheck your counts and proceed with chemotherapy Follow up in 3 weeks prior to your next cycle of chemotherapy

## 2012-09-02 ENCOUNTER — Ambulatory Visit (HOSPITAL_BASED_OUTPATIENT_CLINIC_OR_DEPARTMENT_OTHER): Payer: Medicare Other

## 2012-09-02 VITALS — BP 150/78 | HR 126 | Temp 97.9°F

## 2012-09-02 DIAGNOSIS — C3490 Malignant neoplasm of unspecified part of unspecified bronchus or lung: Secondary | ICD-10-CM

## 2012-09-02 DIAGNOSIS — D702 Other drug-induced agranulocytosis: Secondary | ICD-10-CM

## 2012-09-02 MED ORDER — FILGRASTIM 300 MCG/0.5ML IJ SOLN
300.0000 ug | Freq: Once | INTRAMUSCULAR | Status: AC
  Administered 2012-09-02: 300 ug via SUBCUTANEOUS
  Filled 2012-09-02: qty 0.5

## 2012-09-03 ENCOUNTER — Ambulatory Visit (HOSPITAL_BASED_OUTPATIENT_CLINIC_OR_DEPARTMENT_OTHER): Payer: Medicare Other

## 2012-09-03 VITALS — BP 141/76 | HR 128 | Temp 98.3°F

## 2012-09-03 DIAGNOSIS — T50904A Poisoning by unspecified drugs, medicaments and biological substances, undetermined, initial encounter: Secondary | ICD-10-CM | POA: Diagnosis not present

## 2012-09-03 DIAGNOSIS — D702 Other drug-induced agranulocytosis: Secondary | ICD-10-CM

## 2012-09-03 MED ORDER — FILGRASTIM 300 MCG/0.5ML IJ SOLN
300.0000 ug | Freq: Once | INTRAMUSCULAR | Status: AC
  Administered 2012-09-03: 300 ug via SUBCUTANEOUS
  Filled 2012-09-03: qty 0.5

## 2012-09-04 ENCOUNTER — Ambulatory Visit (HOSPITAL_BASED_OUTPATIENT_CLINIC_OR_DEPARTMENT_OTHER): Payer: Medicare Other

## 2012-09-04 ENCOUNTER — Other Ambulatory Visit: Payer: Self-pay | Admitting: Oncology

## 2012-09-04 ENCOUNTER — Other Ambulatory Visit (HOSPITAL_BASED_OUTPATIENT_CLINIC_OR_DEPARTMENT_OTHER): Payer: Medicare Other | Admitting: Lab

## 2012-09-04 VITALS — BP 146/74 | HR 102 | Temp 99.0°F | Resp 20

## 2012-09-04 DIAGNOSIS — Z5111 Encounter for antineoplastic chemotherapy: Secondary | ICD-10-CM | POA: Diagnosis not present

## 2012-09-04 DIAGNOSIS — C3490 Malignant neoplasm of unspecified part of unspecified bronchus or lung: Secondary | ICD-10-CM

## 2012-09-04 DIAGNOSIS — C3410 Malignant neoplasm of upper lobe, unspecified bronchus or lung: Secondary | ICD-10-CM | POA: Diagnosis not present

## 2012-09-04 LAB — CBC WITH DIFFERENTIAL/PLATELET
BASO%: 0.2 % (ref 0.0–2.0)
Eosinophils Absolute: 0 10*3/uL (ref 0.0–0.5)
LYMPH%: 24.9 % (ref 14.0–49.7)
MCHC: 33.4 g/dL (ref 31.5–36.0)
MCV: 85.7 fL (ref 79.5–101.0)
MONO%: 18.9 % — ABNORMAL HIGH (ref 0.0–14.0)
Platelets: 242 10*3/uL (ref 145–400)
RBC: 3.84 10*6/uL (ref 3.70–5.45)
nRBC: 0 % (ref 0–0)

## 2012-09-04 LAB — COMPREHENSIVE METABOLIC PANEL (CC13)
ALT: 12 U/L (ref 0–55)
CO2: 26 mEq/L (ref 22–29)
Creatinine: 0.8 mg/dL (ref 0.6–1.1)
Total Bilirubin: 0.56 mg/dL (ref 0.20–1.20)

## 2012-09-04 LAB — TECHNOLOGIST REVIEW

## 2012-09-04 MED ORDER — SODIUM CHLORIDE 0.9 % IV SOLN
389.6000 mg | Freq: Once | INTRAVENOUS | Status: AC
  Administered 2012-09-04: 390 mg via INTRAVENOUS
  Filled 2012-09-04: qty 39

## 2012-09-04 MED ORDER — SODIUM CHLORIDE 0.9 % IJ SOLN
100.0000 ug | Freq: Once | INTRAMUSCULAR | Status: AC
  Administered 2012-09-04: 10 mg via INTRADERMAL
  Filled 2012-09-04: qty 1

## 2012-09-04 MED ORDER — HEPARIN SOD (PORK) LOCK FLUSH 100 UNIT/ML IV SOLN
500.0000 [IU] | Freq: Once | INTRAVENOUS | Status: AC | PRN
  Administered 2012-09-04: 500 [IU]
  Filled 2012-09-04: qty 5

## 2012-09-04 MED ORDER — ONDANSETRON 16 MG/50ML IVPB (CHCC)
16.0000 mg | Freq: Once | INTRAVENOUS | Status: AC
  Administered 2012-09-04: 16 mg via INTRAVENOUS

## 2012-09-04 MED ORDER — SODIUM CHLORIDE 0.9 % IV SOLN
800.0000 mg/m2 | Freq: Once | INTRAVENOUS | Status: AC
  Administered 2012-09-04: 1330 mg via INTRAVENOUS
  Filled 2012-09-04: qty 35

## 2012-09-04 MED ORDER — SODIUM CHLORIDE 0.9 % IV SOLN
Freq: Once | INTRAVENOUS | Status: AC
  Administered 2012-09-04: 15:00:00 via INTRAVENOUS

## 2012-09-04 MED ORDER — DEXAMETHASONE SODIUM PHOSPHATE 4 MG/ML IJ SOLN
20.0000 mg | Freq: Once | INTRAMUSCULAR | Status: AC
  Administered 2012-09-04: 20 mg via INTRAVENOUS

## 2012-09-04 MED ORDER — SODIUM CHLORIDE 0.9 % IJ SOLN
10.0000 mL | INTRAMUSCULAR | Status: DC | PRN
  Administered 2012-09-04: 10 mL
  Filled 2012-09-04: qty 10

## 2012-09-04 NOTE — Patient Instructions (Addendum)
[facility] Discharge Instructions for Patients Receiving Chemotherapy  Today you received the following chemotherapy agents: carboplatin, gemzar  To help prevent nausea and vomiting after your treatment, we encourage you to take your nausea medication.  Take it as often as prescribed.     If you develop nausea and vomiting that is not controlled by your nausea medication, call the clinic. If it is after clinic hours your family physician or the after hours number for the clinic or go to the Emergency Department.   BELOW ARE SYMPTOMS THAT SHOULD BE REPORTED IMMEDIATELY:  *FEVER GREATER THAN 100.5 F  *CHILLS WITH OR WITHOUT FEVER  NAUSEA AND VOMITING THAT IS NOT CONTROLLED WITH YOUR NAUSEA MEDICATION  *UNUSUAL SHORTNESS OF BREATH  *UNUSUAL BRUISING OR BLEEDING  TENDERNESS IN MOUTH AND THROAT WITH OR WITHOUT PRESENCE OF ULCERS  *URINARY PROBLEMS  *BOWEL PROBLEMS  UNUSUAL RASH Items with * indicate a potential emergency and should be followed up as soon as possible.  Feel free to call the clinic you have any questions or concerns. The clinic phone number is [phone].   I have been informed and understand all the instructions given to me. I know to contact the clinic, my physician, or go to the Emergency Department if any problems should occur. I do not have any questions at this time, but understand that I may call the clinic during office hours   should I have any questions or need assistance in obtaining follow up care.    __________________________________________  _____________  __________ Signature of Patient or Authorized Representative            Date                   Time    __________________________________________ Nurse's Signature    

## 2012-09-04 NOTE — Progress Notes (Signed)
Carbo test dose administered 1515.  1520 = negative.  1530 = negative.  1545 = negative.

## 2012-09-08 ENCOUNTER — Other Ambulatory Visit: Payer: Medicare Other | Admitting: Lab

## 2012-09-08 ENCOUNTER — Ambulatory Visit: Payer: Medicare Other

## 2012-09-08 NOTE — Progress Notes (Signed)
[facility] [phone]   [phone]  OFFICE PROGRESS NOTE  [name], MD [address] [place] [address]  DIAGNOSIS: Metastatic non-small cell lung cancer, squamous cell carcinoma diagnosed in March of 2013.   PRIOR THERAPY:  1. Status post palliative radiotherapy to the left lung mass under the care of Dr. Michell Heinrich completed on 03/06/2012.  2. Systemic chemotherapy with carboplatin for AUC of 6 on day 1 and Abraxane 100 mg/M2 on days 1, 8 and 15 every 3 weeks. Status post 2 cycles  From cycle 3 forward AUC will be decreased to 4.5 given on day 1 and the Abraxane will be decreased to 90 mg per meter squared on days 1, 8 and 15 every 3 weeks, Status post a total of 3 cycles.  CURRENT THERAPY: Systemic chemotherapy with carboplatin for AUC of 5 on day 1 and gemcitabine 1000 mg/m2 given on day 1 and day 8 every 3 weeks,status post 1 cycle. Due to significant neutropenia she will be dosed reduced beginning cycle 2 forward to carboplatin at an AUC of 4 given on day 1 and gemcitabine at 800 mg per meter squared given on days 1 and 8 every 3 weeks. Status post 4 cycles.   INTERVAL HISTORY: Destiny Harrison 71 y.o. female returns to the clinic today for followup visit accompanied by her son. She complains of a dry hacking cough unrelieved with her current cough syrup. She denied any problems with fever chills. She denied having any significant chest pain but continues to have shortness breath with exertion, no hemoptysis. She also continues to have some fatigue. The patient denied having any significant weight loss or night sweats. She has no bleeding issues. She tolerated the last 4 cycles of chemotherapy was carboplatin and gemcitabine fairly well except for the persistent neutropenia. She denies fever or chills.  MEDICAL HISTORY: Past Medical History  Diagnosis Date  . COPD (chronic obstructive pulmonary disease)   . Cancer   . Neutropenia,  drug-induced 05/05/2012  . Neutropenia, drug-induced 05/05/2012    ALLERGIES:   has no known allergies.  MEDICATIONS:  Current Outpatient Prescriptions  Medication Sig Dispense Refill  . albuterol (PROVENTIL HFA;VENTOLIN HFA) 108 (90 BASE) MCG/ACT inhaler Inhale 2 puffs into the lungs every 3 (three) hours as needed. WHEEZING AND SHORTNESS OF BREATH      . albuterol (PROVENTIL) (2.5 MG/3ML) 0.083% nebulizer solution 1 vial in neb four times daily with Ipratropium.  May add 1 vial every 3 hours as needed for wheezing/shortness of breath      . beclomethasone (QVAR) 80 MCG/ACT inhaler Inhale 2 puffs into the lungs 2 (two) times daily.      . calcium-vitamin D (OSCAL WITH D) 500-200 MG-UNIT per tablet Take 1 tablet by mouth daily.      Marland Kitchen dextromethorphan (DELSYM) 30 MG/5ML liquid 2 tsp every 12 hours as needed for cough      . folic acid (FOLVITE) 400 MCG tablet Take 400 mcg by mouth daily.      Marland Kitchen HYDROcodone-acetaminophen (VICODIN) 5-500 MG per tablet Take 1 tablet by mouth every 4 to 6 hours as needed for pain  60 tablet  0  . HYDROcodone-homatropine (HYCODAN) 5-1.5 MG/5ML syrup 1.70mL every 4 hours as needed for pain      . ipratropium (ATROVENT) 0.02 % nebulizer solution 1 vial in neb four times daily with Albuterol      . predniSONE (DELTASONE) 5 MG tablet Take 5 mg  by mouth daily.        . simvastatin (ZOCOR) 5 MG tablet Take 5 mg by mouth at bedtime.        SURGICAL HISTORY:  Past Surgical History  Procedure Date  . Appendex 1962  . Video bronchoscopy 01/28/2012    Procedure: VIDEO BRONCHOSCOPY WITHOUT FLUORO;  Surgeon: Kalman Shan, MD;  Location: [facility] ENDOSCOPY;  Service: Endoscopy;  Laterality: Bilateral;  . Surgery on right wrist     REVIEW OF SYSTEMS:  A comprehensive review of systems was negative except for: Constitutional: positive for fatigue Respiratory: positive for cough   PHYSICAL EXAMINATION: General appearance: alert, cooperative and no distress Head: Normocephalic,  without obvious abnormality, atraumatic Neck: no adenopathy Lymph nodes: Cervical, supraclavicular, and axillary nodes normal. Resp: clear to auscultation bilaterally Cardio: regular rate and rhythm, S1, S2 normal, no murmur, click, rub or gallop GI: soft, non-tender; bowel sounds normal; no masses,  no organomegaly Extremities: extremities normal, atraumatic, no cyanosis or edema Neurologic: Alert and oriented X 3, normal strength and tone. Normal symmetric reflexes. Normal coordination and gait  ECOG PERFORMANCE STATUS: 1 - Symptomatic but completely ambulatory  Blood pressure 143/57, pulse 95, temperature 98.5 F (36.9 C), temperature source Oral, resp. rate 20, height 5\' 5"  (1.651 m), weight 132 lb 11.2 oz (60.192 kg).  LABORATORY DATA: Lab Results  Component Value Date   WBC 10.2 09/04/2012   HGB 11.0* 09/04/2012   HCT 32.9* 09/04/2012   MCV 85.7 09/04/2012   PLT 242 09/04/2012      Chemistry      Component Value Date/Time   NA 137 09/04/2012 1337   NA 139 06/02/2012 1043   K 3.6 09/04/2012 1337   K 3.9 06/02/2012 1043   CL 101 09/04/2012 1337   CL 103 06/02/2012 1043   CO2 26 09/04/2012 1337   CO2 27 06/02/2012 1043   BUN 10.0 09/04/2012 1337   BUN 8 06/02/2012 1043   CREATININE 0.8 09/04/2012 1337   CREATININE 0.67 06/02/2012 1043      Component Value Date/Time   CALCIUM 9.2 09/04/2012 1337   CALCIUM 9.1 06/02/2012 1043   ALKPHOS 94 09/04/2012 1337   ALKPHOS 68 06/02/2012 1043   AST 17 09/04/2012 1337   AST 20 06/02/2012 1043   ALT 12 09/04/2012 1337   ALT 14 06/02/2012 1043   BILITOT 0.56 09/04/2012 1337   BILITOT 0.7 06/02/2012 1043       RADIOGRAPHIC STUDIES: Ct Chest W Contrast  07/30/2012  *RADIOLOGY REPORT*  Clinical Data:  Lung cancer diagnosed in 2012, radiation complete. Chemotherapy ongoing.  CT CHEST, ABDOMEN AND PELVIS WITH CONTRAST  Technique:  Multidetector CT imaging of the chest, abdomen and pelvis was performed following the standard protocol  during bolus administration of intravenous contrast.  Contrast: OMNIPAQUE IOHEXOL 300 MG/ML  SOLN  Comparison:  CT 05/07/2012, PET CT 02/20/2012   CT CHEST  Findings:  There is a port in the right anterior chest wall.  No axillary lymphadenopathy.  There is a 16 mm right supraclavicular lymph node which is similar to 15 mm on prior.  Small thyroid nodules unchanged.  The mediastinum conglomerate of right paratracheal lymph nodes measures 23 x 17 mm which is slightly decreased from 24 x 19 mm on prior.  No hilar adenopathy.  No pericardial fluid.  Esophagus is normal.  Review of the lung parenchyma demonstrates central lobular emphysema.  There is linear scarring and pleural thickening at the lung apices which is unchanged.  The peribronchial thickening in the left superior hilum surrounding the left upper lobe bronchus is stable.  This site of prior mass.  IMPRESSION:  1.  Slight decrease in size of right paratracheal nodal conglomerate. 2.  Stable right supraclavicular lymph node. 3.  No evidence of disease progression.  Stable left hilar peribronchial thickening.  4.  Emphysematous change.   CT ABDOMEN AND PELVIS  Findings:  No focal hepatic lesion.  The gallbladder, pancreas, spleen, adrenal glands are normal.  Kidneys are normal.  Stomach, small bowel, and colon are unremarkable.  Abdominal aorta is normal caliber.  No retroperitoneal periportal lymphadenopathy.  No peritoneal disease.  The bladder and uterus are normal, ovaries are normal.  No pelvic lymphadenopathy. Review of  bone windows demonstrates no aggressive osseous lesions.  There is increased trabeculation in the right proximal femur suggesting Paget's disease.  IMPRESSION: 1.  No evidence of abdominal or pelvic metastasis. 2.  Paget's disease of the right hip.   Original Report Authenticated By: Genevive Bi, M.D.     ASSESSMENT/PLAN: This is a very pleasant 71 years old African American female with metastatic non-small cell lung  cancer, adenocarcinoma currently on treatment with systemic chemotherapy with carboplatin and gemcitabine, status post 3 cycles with several days of her chemotherapy secondary to neutropenia. The patient has some improvement in her disease with no evidence for disease progression on her recent restaging CT scan. Her absolute neutrophil count is 600 today, subtherapeutic proceed with chemotherapy today as scheduled. Patient was discussed with Dr. Arbutus Ped. She'll be given Neupogen for the next 3 days and we will recheck counts on Friday and if they are in except were arranged proceed with cycle #5 of her systemic chemotherapy with carboplatin and gemcitabine. She'll return in 3 weeks prior to cycle #6 with repeat CBC differential and C. met. Neutropenic precautions were reviewed with the patient and her son, both voiced understanding.  Laural Benes, Ebenezer Mccaskey E, PA-C   All questions were answered. The patient knows to call the clinic with any problems, questions or concerns. We can certainly see the patient much sooner if necessary.  I spent 20 minutes counseling the patient face to face. The total time spent in the appointment was 30 minutes.

## 2012-09-09 ENCOUNTER — Ambulatory Visit: Payer: Medicare Other

## 2012-09-11 ENCOUNTER — Ambulatory Visit (HOSPITAL_BASED_OUTPATIENT_CLINIC_OR_DEPARTMENT_OTHER): Payer: Medicare Other

## 2012-09-11 ENCOUNTER — Other Ambulatory Visit: Payer: Self-pay | Admitting: Physician Assistant

## 2012-09-11 ENCOUNTER — Other Ambulatory Visit (HOSPITAL_BASED_OUTPATIENT_CLINIC_OR_DEPARTMENT_OTHER): Payer: Medicare Other | Admitting: Lab

## 2012-09-11 DIAGNOSIS — C3410 Malignant neoplasm of upper lobe, unspecified bronchus or lung: Secondary | ICD-10-CM | POA: Diagnosis not present

## 2012-09-11 DIAGNOSIS — C3490 Malignant neoplasm of unspecified part of unspecified bronchus or lung: Secondary | ICD-10-CM

## 2012-09-11 LAB — CBC WITH DIFFERENTIAL/PLATELET
BASO%: 0.6 % (ref 0.0–2.0)
Eosinophils Absolute: 0 10*3/uL (ref 0.0–0.5)
MCHC: 33.8 g/dL (ref 31.5–36.0)
MONO#: 0.1 10*3/uL (ref 0.1–0.9)
NEUT#: 0.4 10*3/uL — CL (ref 1.5–6.5)
RBC: 3.18 10*6/uL — ABNORMAL LOW (ref 3.70–5.45)
RDW: 15.9 % — ABNORMAL HIGH (ref 11.2–14.5)
WBC: 1.7 10*3/uL — ABNORMAL LOW (ref 3.9–10.3)
lymph#: 1.1 10*3/uL (ref 0.9–3.3)
nRBC: 0 % (ref 0–0)

## 2012-09-11 MED ORDER — PEGFILGRASTIM INJECTION 6 MG/0.6ML
6.0000 mg | Freq: Once | SUBCUTANEOUS | Status: AC
  Administered 2012-09-11: 6 mg via SUBCUTANEOUS
  Filled 2012-09-11: qty 0.6

## 2012-09-11 NOTE — Progress Notes (Signed)
Destiny Loft, PA aware labs.  Per Adrena/Dr. Darrold Span, will not treat today with labs, will administer Neulasta today and have pt follow up at regularly scheduled appt.  Pt aware, copy of labs given, educated on neutropenic precautions-dhp, rn

## 2012-09-12 ENCOUNTER — Ambulatory Visit: Payer: Medicare Other

## 2012-09-14 NOTE — Progress Notes (Signed)
Quick Note:  Call patient with the result give Neutropenic precaution ______

## 2012-09-22 ENCOUNTER — Telehealth: Payer: Self-pay | Admitting: Internal Medicine

## 2012-09-22 ENCOUNTER — Encounter: Payer: Self-pay | Admitting: Physician Assistant

## 2012-09-22 ENCOUNTER — Ambulatory Visit: Payer: Medicare Other | Admitting: Nutrition

## 2012-09-22 ENCOUNTER — Ambulatory Visit (HOSPITAL_BASED_OUTPATIENT_CLINIC_OR_DEPARTMENT_OTHER): Payer: Medicare Other | Admitting: Physician Assistant

## 2012-09-22 ENCOUNTER — Other Ambulatory Visit (HOSPITAL_BASED_OUTPATIENT_CLINIC_OR_DEPARTMENT_OTHER): Payer: Medicare Other | Admitting: Lab

## 2012-09-22 ENCOUNTER — Ambulatory Visit (HOSPITAL_BASED_OUTPATIENT_CLINIC_OR_DEPARTMENT_OTHER): Payer: Medicare Other

## 2012-09-22 VITALS — BP 135/73 | HR 115 | Temp 99.4°F | Resp 20 | Ht 65.0 in | Wt 133.4 lb

## 2012-09-22 DIAGNOSIS — C3490 Malignant neoplasm of unspecified part of unspecified bronchus or lung: Secondary | ICD-10-CM

## 2012-09-22 DIAGNOSIS — M7989 Other specified soft tissue disorders: Secondary | ICD-10-CM

## 2012-09-22 DIAGNOSIS — C3410 Malignant neoplasm of upper lobe, unspecified bronchus or lung: Secondary | ICD-10-CM | POA: Diagnosis not present

## 2012-09-22 DIAGNOSIS — Z5111 Encounter for antineoplastic chemotherapy: Secondary | ICD-10-CM

## 2012-09-22 LAB — CBC WITH DIFFERENTIAL/PLATELET
BASO%: 0.4 % (ref 0.0–2.0)
Eosinophils Absolute: 0.1 10*3/uL (ref 0.0–0.5)
HCT: 28.1 % — ABNORMAL LOW (ref 34.8–46.6)
LYMPH%: 20.6 % (ref 14.0–49.7)
MCHC: 33.8 g/dL (ref 31.5–36.0)
MCV: 84.4 fL (ref 79.5–101.0)
MONO#: 1.4 10*3/uL — ABNORMAL HIGH (ref 0.1–0.9)
MONO%: 17.9 % — ABNORMAL HIGH (ref 0.0–14.0)
NEUT%: 60.1 % (ref 38.4–76.8)
Platelets: 246 10*3/uL (ref 145–400)
RBC: 3.33 10*6/uL — ABNORMAL LOW (ref 3.70–5.45)
WBC: 8 10*3/uL (ref 3.9–10.3)

## 2012-09-22 LAB — COMPREHENSIVE METABOLIC PANEL (CC13)
BUN: 9 mg/dL (ref 7.0–26.0)
CO2: 27 mEq/L (ref 22–29)
Calcium: 7.9 mg/dL — ABNORMAL LOW (ref 8.4–10.4)
Chloride: 102 mEq/L (ref 98–107)
Creatinine: 0.7 mg/dL (ref 0.6–1.1)
Total Bilirubin: 0.45 mg/dL (ref 0.20–1.20)

## 2012-09-22 MED ORDER — SODIUM CHLORIDE 0.9 % IJ SOLN
100.0000 ug | Freq: Once | INTRAVENOUS | Status: AC
  Administered 2012-09-22: 10 mg via INTRADERMAL
  Filled 2012-09-22: qty 1

## 2012-09-22 MED ORDER — CARBOPLATIN CHEMO INJECTION 450 MG/45ML
338.8000 mg | Freq: Once | INTRAVENOUS | Status: AC
  Administered 2012-09-22: 340 mg via INTRAVENOUS
  Filled 2012-09-22: qty 34

## 2012-09-22 MED ORDER — SODIUM CHLORIDE 0.9 % IV SOLN
Freq: Once | INTRAVENOUS | Status: AC
  Administered 2012-09-22: 15:00:00 via INTRAVENOUS

## 2012-09-22 MED ORDER — SODIUM CHLORIDE 0.9 % IJ SOLN
10.0000 mL | INTRAMUSCULAR | Status: DC | PRN
  Administered 2012-09-22: 10 mL
  Filled 2012-09-22: qty 10

## 2012-09-22 MED ORDER — DEXAMETHASONE SODIUM PHOSPHATE 4 MG/ML IJ SOLN
20.0000 mg | Freq: Once | INTRAMUSCULAR | Status: AC
  Administered 2012-09-22: 20 mg via INTRAVENOUS

## 2012-09-22 MED ORDER — ONDANSETRON 16 MG/50ML IVPB (CHCC)
16.0000 mg | Freq: Once | INTRAVENOUS | Status: AC
  Administered 2012-09-22: 16 mg via INTRAVENOUS

## 2012-09-22 MED ORDER — HEPARIN SOD (PORK) LOCK FLUSH 100 UNIT/ML IV SOLN
500.0000 [IU] | Freq: Once | INTRAVENOUS | Status: AC | PRN
  Administered 2012-09-22: 500 [IU]
  Filled 2012-09-22: qty 5

## 2012-09-22 MED ORDER — SODIUM CHLORIDE 0.9 % IV SOLN
800.0000 mg/m2 | Freq: Once | INTRAVENOUS | Status: AC
  Administered 2012-09-22: 1330 mg via INTRAVENOUS
  Filled 2012-09-22: qty 35

## 2012-09-22 NOTE — Telephone Encounter (Signed)
gv and printed appt schedule for pt for DEC.Marland Kitchenthe patient aware michelle adding chemo.Marland KitchenMarland Kitchen

## 2012-09-22 NOTE — Progress Notes (Signed)
1447-Carboplatin test dose given to right inner forearm per order.  1452-Site clear.    1502-Site unremarkable.  1517-Site unremarkable.

## 2012-09-22 NOTE — Progress Notes (Signed)
Patient continues to eat well. Her weight was documented as 133.4 pounds which is within her usual weight range. She has no nutrition concerns or questions at this time.  Nutrition diagnosis: Food and nutrition related knowledge deficit resolved.  Provided support and encouragement for patient to continue oral intake to promote weight maintenance. She has my contact information and agrees to call me if she has any questions or concerns. There is no followup scheduled at this time.

## 2012-09-22 NOTE — Patient Instructions (Addendum)
Follow up with Dr. Arbutus Ped in 3 weeks with restaging CT scans to re-evaluate your disease

## 2012-09-22 NOTE — Patient Instructions (Addendum)
[facility] Discharge Instructions for Patients Receiving Chemotherapy  Today you received the following chemotherapy agents Gemzar and Carboplatin.  To help prevent nausea and vomiting after your treatment, we encourage you to take your nausea medication as ordered per MD.    If you develop nausea and vomiting that is not controlled by your nausea medication, call the clinic. If it is after clinic hours your family physician or the after hours number for the clinic or go to the Emergency Department.   BELOW ARE SYMPTOMS THAT SHOULD BE REPORTED IMMEDIATELY:  *FEVER GREATER THAN 100.5 F  *CHILLS WITH OR WITHOUT FEVER  NAUSEA AND VOMITING THAT IS NOT CONTROLLED WITH YOUR NAUSEA MEDICATION  *UNUSUAL SHORTNESS OF BREATH  *UNUSUAL BRUISING OR BLEEDING  TENDERNESS IN MOUTH AND THROAT WITH OR WITHOUT PRESENCE OF ULCERS  *URINARY PROBLEMS  *BOWEL PROBLEMS  UNUSUAL RASH Items with * indicate a potential emergency and should be followed up as soon as possible.   Please let the nurse know about any problems that you may have experienced. Feel free to call the clinic you have any questions or concerns. The clinic phone number is [phone].   I have been informed and understand all the instructions given to me. I know to contact the clinic, my physician, or go to the Emergency Department if any problems should occur. I do not have any questions at this time, but understand that I may call the clinic during office hours   should I have any questions or need assistance in obtaining follow up care.    __________________________________________  _____________  __________ Signature of Patient or Authorized Representative            Date                   Time    __________________________________________ Nurse's Signature

## 2012-09-23 ENCOUNTER — Telehealth: Payer: Self-pay | Admitting: *Deleted

## 2012-09-23 ENCOUNTER — Telehealth (HOSPITAL_COMMUNITY): Payer: Self-pay | Admitting: *Deleted

## 2012-09-23 NOTE — Telephone Encounter (Signed)
Mrs Destiny Harrison

## 2012-09-23 NOTE — Telephone Encounter (Signed)
Per staff message and POF I have scheduled appts.  JMW  

## 2012-09-29 ENCOUNTER — Other Ambulatory Visit (HOSPITAL_BASED_OUTPATIENT_CLINIC_OR_DEPARTMENT_OTHER): Payer: Medicare Other | Admitting: Lab

## 2012-09-29 ENCOUNTER — Telehealth: Payer: Self-pay | Admitting: *Deleted

## 2012-09-29 ENCOUNTER — Other Ambulatory Visit: Payer: Self-pay | Admitting: Medical Oncology

## 2012-09-29 ENCOUNTER — Ambulatory Visit: Payer: Medicare Other

## 2012-09-29 ENCOUNTER — Telehealth: Payer: Self-pay | Admitting: Internal Medicine

## 2012-09-29 ENCOUNTER — Other Ambulatory Visit: Payer: Medicare Other | Admitting: Lab

## 2012-09-29 DIAGNOSIS — D702 Other drug-induced agranulocytosis: Secondary | ICD-10-CM | POA: Diagnosis not present

## 2012-09-29 DIAGNOSIS — C3410 Malignant neoplasm of upper lobe, unspecified bronchus or lung: Secondary | ICD-10-CM | POA: Diagnosis not present

## 2012-09-29 DIAGNOSIS — C3490 Malignant neoplasm of unspecified part of unspecified bronchus or lung: Secondary | ICD-10-CM

## 2012-09-29 LAB — COMPREHENSIVE METABOLIC PANEL (CC13)
AST: 19 U/L (ref 5–34)
Albumin: 3.2 g/dL — ABNORMAL LOW (ref 3.5–5.0)
Alkaline Phosphatase: 107 U/L (ref 40–150)
Potassium: 3.5 mEq/L (ref 3.5–5.1)
Sodium: 140 mEq/L (ref 136–145)
Total Bilirubin: 0.48 mg/dL (ref 0.20–1.20)
Total Protein: 7.4 g/dL (ref 6.4–8.3)

## 2012-09-29 LAB — CBC WITH DIFFERENTIAL/PLATELET
BASO%: 0.6 % (ref 0.0–2.0)
EOS%: 0.4 % (ref 0.0–7.0)
Eosinophils Absolute: 0 10*3/uL (ref 0.0–0.5)
LYMPH%: 43.9 % (ref 14.0–49.7)
MCH: 29.4 pg (ref 25.1–34.0)
MCHC: 33.9 g/dL (ref 31.5–36.0)
MCV: 86.5 fL (ref 79.5–101.0)
MONO%: 7.6 % (ref 0.0–14.0)
NEUT#: 0.9 10*3/uL — ABNORMAL LOW (ref 1.5–6.5)
RBC: 2.92 10*6/uL — ABNORMAL LOW (ref 3.70–5.45)
RDW: 18.8 % — ABNORMAL HIGH (ref 11.2–14.5)

## 2012-09-29 NOTE — Telephone Encounter (Signed)
Pt came by to change schedule regarding chemo, count was down today, called Dr. Arbutus Ped nurse and left message regarding chemo rescheduling. Gave pt contrast for CT schduled on 12/19

## 2012-09-29 NOTE — Progress Notes (Signed)
Per Dr Donnald Garre, need to hold tx today with WBC 1.9 ANC 0.9.  Chemo appt today and Injection appt tomorrow cancelled.  Pt is aware and was educated regarding neutropenic pxns.  SLJ

## 2012-09-29 NOTE — Telephone Encounter (Signed)
Keep ct on 12/17 per Dr Audrie Gallus

## 2012-09-29 NOTE — Progress Notes (Signed)
Cancelled tx today per dr Arbutus Ped due to low blood counts.  dmr

## 2012-09-29 NOTE — Progress Notes (Signed)
[facility] [phone]   Fax:([phone]  OFFICE PROGRESS NOTE  [name], MD [address] [place] [address]  DIAGNOSIS: Metastatic non-small cell lung cancer, squamous cell carcinoma diagnosed in March of 2013.   PRIOR THERAPY:  1. Status post palliative radiotherapy to the left lung mass under the care of Dr. Michell Heinrich completed on 03/06/2012.  2. Systemic chemotherapy with carboplatin for AUC of 6 on day 1 and Abraxane 100 mg/M2 on days 1, 8 and 15 every 3 weeks. Status post 2 cycles  From cycle 3 forward AUC will be decreased to 4.5 given on day 1 and the Abraxane will be decreased to 90 mg per meter squared on days 1, 8 and 15 every 3 weeks, Status post a total of 3 cycles.  CURRENT THERAPY: Systemic chemotherapy with carboplatin for AUC of 5 on day 1 and gemcitabine 1000 mg/m2 given on day 1 and day 8 every 3 weeks,status post 1 cycle. Due to significant neutropenia she will be dosed reduced beginning cycle 2 forward to carboplatin at an AUC of 4 given on day 1 and gemcitabine at 800 mg per meter squared given on days 1 and 8 every 3 weeks. Status post 5 cycles.   INTERVAL HISTORY: Destiny Harrison 71 y.o. female returns to the clinic today for followup visit accompanied by her son. Overall she is tolerating her chemotherapy relatively well. She does report swelling and soreness involving her left shoulder and right thigh. She has a history of Paget's disease involving the right hip.She denied any problems with fever chills. She denied having any significant chest pain but continues to have shortness breath with exertion, no hemoptysis. She also continues to have some fatigue. The patient denied having any significant weight loss or night sweats. She has no bleeding issues.   MEDICAL HISTORY: Past Medical History  Diagnosis Date  . COPD (chronic obstructive pulmonary disease)   . Cancer   . Neutropenia, drug-induced 05/05/2012  .  Neutropenia, drug-induced 05/05/2012    ALLERGIES:   has no known allergies.  MEDICATIONS:  Current Outpatient Prescriptions  Medication Sig Dispense Refill  . albuterol (PROVENTIL HFA;VENTOLIN HFA) 108 (90 BASE) MCG/ACT inhaler Inhale 2 puffs into the lungs every 3 (three) hours as needed. WHEEZING AND SHORTNESS OF BREATH      . albuterol (PROVENTIL) (2.5 MG/3ML) 0.083% nebulizer solution 1 vial in neb four times daily with Ipratropium.  May add 1 vial every 3 hours as needed for wheezing/shortness of breath      . beclomethasone (QVAR) 80 MCG/ACT inhaler Inhale 2 puffs into the lungs 2 (two) times daily.      . calcium-vitamin D (OSCAL WITH D) 500-200 MG-UNIT per tablet Take 1 tablet by mouth daily.      Marland Kitchen dextromethorphan (DELSYM) 30 MG/5ML liquid 2 tsp every 12 hours as needed for cough      . folic acid (FOLVITE) 400 MCG tablet Take 400 mcg by mouth daily.      Marland Kitchen HYDROcodone-acetaminophen (VICODIN) 5-500 MG per tablet Take 1 tablet by mouth every 4 to 6 hours as needed for pain  60 tablet  0  . HYDROcodone-homatropine (HYCODAN) 5-1.5 MG/5ML syrup 1.45mL every 4 hours as needed for pain      . ipratropium (ATROVENT) 0.02 % nebulizer solution 1 vial in neb four times daily with Albuterol      . predniSONE (DELTASONE) 5 MG tablet Take 5 mg by mouth daily.        Marland Kitchen  simvastatin (ZOCOR) 5 MG tablet Take 5 mg by mouth at bedtime.        SURGICAL HISTORY:  Past Surgical History  Procedure Date  . Appendex 1962  . Video bronchoscopy 01/28/2012    Procedure: VIDEO BRONCHOSCOPY WITHOUT FLUORO;  Surgeon: Kalman Shan, MD;  Location: [facility] ENDOSCOPY;  Service: Endoscopy;  Laterality: Bilateral;  . Surgery on right wrist     REVIEW OF SYSTEMS:  Pertinent items are noted in HPI.   PHYSICAL EXAMINATION: General appearance: alert, cooperative and no distress Head: Normocephalic, without obvious abnormality, atraumatic Neck: no adenopathy Lymph nodes: Cervical, supraclavicular, and axillary nodes  normal. Resp: clear to auscultation bilaterally Cardio: regular rate and rhythm, S1, S2 normal, no murmur, click, rub or gallop GI: soft, non-tender; bowel sounds normal; no masses,  no organomegaly Extremities: There is 1-2+ soft tissue swelling about the left a.c. joint towards the top of the shoulder this area is nontender there is no erythema or warmth. The right mid anterior thigh there is also 1-2+ soft tissue swelling also nontender and nonerythematous Neurologic: Alert and oriented X 3, normal strength and tone. Normal symmetric reflexes. Normal coordination and gait  ECOG PERFORMANCE STATUS: 1 - Symptomatic but completely ambulatory  Blood pressure 135/73, pulse 115, temperature 99.4 F (37.4 C), temperature source Oral, resp. rate 20, height 5\' 5"  (1.651 m), weight 133 lb 6.4 oz (60.51 kg).  LABORATORY DATA: Lab Results  Component Value Date   WBC 1.9* 09/29/2012   HGB 8.6* 09/29/2012   HCT 25.3* 09/29/2012   MCV 86.5 09/29/2012   PLT 241 09/29/2012      Chemistry      Component Value Date/Time   NA 140 09/29/2012 0945   NA 139 06/02/2012 1043   K 3.5 09/29/2012 0945   K 3.9 06/02/2012 1043   CL 102 09/29/2012 0945   CL 103 06/02/2012 1043   CO2 28 09/29/2012 0945   CO2 27 06/02/2012 1043   BUN 11.0 09/29/2012 0945   BUN 8 06/02/2012 1043   CREATININE 0.8 09/29/2012 0945   CREATININE 0.67 06/02/2012 1043      Component Value Date/Time   CALCIUM 9.2 09/29/2012 0945   CALCIUM 9.1 06/02/2012 1043   ALKPHOS 107 09/29/2012 0945   ALKPHOS 68 06/02/2012 1043   AST 19 09/29/2012 0945   AST 20 06/02/2012 1043   ALT 19 09/29/2012 0945   ALT 14 06/02/2012 1043   BILITOT 0.48 09/29/2012 0945   BILITOT 0.7 06/02/2012 1043       RADIOGRAPHIC STUDIES: Ct Chest W Contrast  07/30/2012  *RADIOLOGY REPORT*  Clinical Data:  Lung cancer diagnosed in 2012, radiation complete. Chemotherapy ongoing.  CT CHEST, ABDOMEN AND PELVIS WITH CONTRAST  Technique:  Multidetector CT imaging of  the chest, abdomen and pelvis was performed following the standard protocol during bolus administration of intravenous contrast.  Contrast: OMNIPAQUE IOHEXOL 300 MG/ML  SOLN  Comparison:  CT 05/07/2012, PET CT 02/20/2012   CT CHEST  Findings:  There is a port in the right anterior chest wall.  No axillary lymphadenopathy.  There is a 16 mm right supraclavicular lymph node which is similar to 15 mm on prior.  Small thyroid nodules unchanged.  The mediastinum conglomerate of right paratracheal lymph nodes measures 23 x 17 mm which is slightly decreased from 24 x 19 mm on prior.  No hilar adenopathy.  No pericardial fluid.  Esophagus is normal.  Review of the lung parenchyma demonstrates central lobular emphysema.  There  is linear scarring and pleural thickening at the lung apices which is unchanged.  The peribronchial thickening in the left superior hilum surrounding the left upper lobe bronchus is stable.  This site of prior mass.  IMPRESSION:  1.  Slight decrease in size of right paratracheal nodal conglomerate. 2.  Stable right supraclavicular lymph node. 3.  No evidence of disease progression.  Stable left hilar peribronchial thickening.  4.  Emphysematous change.   CT ABDOMEN AND PELVIS  Findings:  No focal hepatic lesion.  The gallbladder, pancreas, spleen, adrenal glands are normal.  Kidneys are normal.  Stomach, small bowel, and colon are unremarkable.  Abdominal aorta is normal caliber.  No retroperitoneal periportal lymphadenopathy.  No peritoneal disease.  The bladder and uterus are normal, ovaries are normal.  No pelvic lymphadenopathy. Review of  bone windows demonstrates no aggressive osseous lesions.  There is increased trabeculation in the right proximal femur suggesting Paget's disease.  IMPRESSION: 1.  No evidence of abdominal or pelvic metastasis. 2.  Paget's disease of the right hip.   Original Report Authenticated By: Genevive Bi, M.D.     ASSESSMENT/PLAN: This is a very pleasant 70  years old African American female with metastatic non-small cell lung cancer, adenocarcinoma currently on treatment with systemic chemotherapy with carboplatin and gemcitabine, status post 3 cycles with several days missed of her chemotherapy secondary to neutropenia. The patient has some improvement in her disease with no evidence for disease progression on her last restaging CT scan. Patient was discussed with Dr. Arbutus Ped. She'll proceed to cycle #6 of her systemic chemotherapy with carboplatin and gemcitabine. The patient will followup with Dr. Arbutus Ped in 3 weeks with repeat CBC differential, C. met and CT of the chest abdomen and pelvis to reevaluate her disease. We will also include a CT scan of the right femur and the CT of the chest will pay special attention to the left shoulder to further evaluate soft tissue swelling in these areas.   Laural Benes, Salisha Bardsley E, PA-C   All questions were answered. The patient knows to call the clinic with any problems, questions or concerns. We can certainly see the patient much sooner if necessary.  I spent 20 minutes counseling the patient face to face. The total time spent in the appointment was 30 minutes.

## 2012-09-30 ENCOUNTER — Ambulatory Visit: Payer: Medicare Other

## 2012-10-06 ENCOUNTER — Other Ambulatory Visit (HOSPITAL_BASED_OUTPATIENT_CLINIC_OR_DEPARTMENT_OTHER): Payer: Medicare Other | Admitting: Lab

## 2012-10-06 ENCOUNTER — Other Ambulatory Visit: Payer: Medicare Other | Admitting: Lab

## 2012-10-06 DIAGNOSIS — C3490 Malignant neoplasm of unspecified part of unspecified bronchus or lung: Secondary | ICD-10-CM

## 2012-10-06 DIAGNOSIS — C3410 Malignant neoplasm of upper lobe, unspecified bronchus or lung: Secondary | ICD-10-CM

## 2012-10-06 LAB — COMPREHENSIVE METABOLIC PANEL (CC13)
AST: 21 U/L (ref 5–34)
Albumin: 3.4 g/dL — ABNORMAL LOW (ref 3.5–5.0)
Alkaline Phosphatase: 94 U/L (ref 40–150)
BUN: 10 mg/dL (ref 7.0–26.0)
Potassium: 3.9 mEq/L (ref 3.5–5.1)
Total Bilirubin: 0.39 mg/dL (ref 0.20–1.20)

## 2012-10-06 LAB — CBC WITH DIFFERENTIAL/PLATELET
Basophils Absolute: 0 10*3/uL (ref 0.0–0.1)
EOS%: 2.1 % (ref 0.0–7.0)
HGB: 9.1 g/dL — ABNORMAL LOW (ref 11.6–15.9)
MCH: 30.5 pg (ref 25.1–34.0)
MCV: 88.9 fL (ref 79.5–101.0)
MONO%: 26 % — ABNORMAL HIGH (ref 0.0–14.0)
RBC: 2.97 10*6/uL — ABNORMAL LOW (ref 3.70–5.45)
RDW: 20.5 % — ABNORMAL HIGH (ref 11.2–14.5)

## 2012-10-08 ENCOUNTER — Telehealth: Payer: Self-pay | Admitting: Internal Medicine

## 2012-10-08 ENCOUNTER — Ambulatory Visit (HOSPITAL_COMMUNITY): Payer: Medicare Other

## 2012-10-08 ENCOUNTER — Ambulatory Visit (HOSPITAL_COMMUNITY)
Admission: RE | Admit: 2012-10-08 | Discharge: 2012-10-08 | Disposition: A | Discharge status: Home / Self Care | Payer: Medicare Other | Source: Ambulatory Visit | Attending: Physician Assistant | Admitting: Physician Assistant

## 2012-10-08 DIAGNOSIS — M948X9 Other specified disorders of cartilage, unspecified sites: Secondary | ICD-10-CM | POA: Insufficient documentation

## 2012-10-08 DIAGNOSIS — C771 Secondary and unspecified malignant neoplasm of intrathoracic lymph nodes: Secondary | ICD-10-CM | POA: Diagnosis not present

## 2012-10-08 DIAGNOSIS — C3490 Malignant neoplasm of unspecified part of unspecified bronchus or lung: Secondary | ICD-10-CM | POA: Insufficient documentation

## 2012-10-08 DIAGNOSIS — J438 Other emphysema: Secondary | ICD-10-CM | POA: Insufficient documentation

## 2012-10-08 DIAGNOSIS — M889 Osteitis deformans of unspecified bone: Secondary | ICD-10-CM | POA: Insufficient documentation

## 2012-10-08 MED ORDER — IOHEXOL 300 MG/ML  SOLN
100.0000 mL | Freq: Once | INTRAMUSCULAR | Status: AC | PRN
  Administered 2012-10-08: 100 mL via INTRAVENOUS

## 2012-10-08 NOTE — Telephone Encounter (Signed)
Moved 1/1 inj appt to 1/2 due to office closed. S/w pt she is aware. Also Rubye Oaks aware.

## 2012-10-09 ENCOUNTER — Emergency Department (HOSPITAL_COMMUNITY): Payer: Medicare Other

## 2012-10-09 ENCOUNTER — Encounter (HOSPITAL_COMMUNITY): Payer: Self-pay | Admitting: Emergency Medicine

## 2012-10-09 ENCOUNTER — Inpatient Hospital Stay (HOSPITAL_COMMUNITY)
Admission: EM | Admit: 2012-10-09 | Discharge: 2012-10-12 | DRG: 193 | Disposition: A | Discharge status: Home / Self Care | Payer: Medicare Other | Attending: Emergency Medicine | Admitting: Emergency Medicine

## 2012-10-09 ENCOUNTER — Other Ambulatory Visit: Payer: Self-pay

## 2012-10-09 ENCOUNTER — Telehealth: Payer: Self-pay | Admitting: *Deleted

## 2012-10-09 DIAGNOSIS — A084 Viral intestinal infection, unspecified: Secondary | ICD-10-CM

## 2012-10-09 DIAGNOSIS — J9600 Acute respiratory failure, unspecified whether with hypoxia or hypercapnia: Secondary | ICD-10-CM | POA: Diagnosis present

## 2012-10-09 DIAGNOSIS — E785 Hyperlipidemia, unspecified: Secondary | ICD-10-CM | POA: Diagnosis not present

## 2012-10-09 DIAGNOSIS — J4489 Other specified chronic obstructive pulmonary disease: Secondary | ICD-10-CM | POA: Diagnosis not present

## 2012-10-09 DIAGNOSIS — R222 Localized swelling, mass and lump, trunk: Secondary | ICD-10-CM

## 2012-10-09 DIAGNOSIS — IMO0002 Reserved for concepts with insufficient information to code with codable children: Secondary | ICD-10-CM

## 2012-10-09 DIAGNOSIS — N390 Urinary tract infection, site not specified: Secondary | ICD-10-CM

## 2012-10-09 DIAGNOSIS — R509 Fever, unspecified: Secondary | ICD-10-CM

## 2012-10-09 DIAGNOSIS — J189 Pneumonia, unspecified organism: Secondary | ICD-10-CM | POA: Diagnosis not present

## 2012-10-09 DIAGNOSIS — M069 Rheumatoid arthritis, unspecified: Secondary | ICD-10-CM | POA: Diagnosis present

## 2012-10-09 DIAGNOSIS — J449 Chronic obstructive pulmonary disease, unspecified: Secondary | ICD-10-CM | POA: Diagnosis not present

## 2012-10-09 DIAGNOSIS — R0600 Dyspnea, unspecified: Secondary | ICD-10-CM

## 2012-10-09 DIAGNOSIS — C3490 Malignant neoplasm of unspecified part of unspecified bronchus or lung: Secondary | ICD-10-CM | POA: Diagnosis not present

## 2012-10-09 DIAGNOSIS — Z79899 Other long term (current) drug therapy: Secondary | ICD-10-CM

## 2012-10-09 DIAGNOSIS — J441 Chronic obstructive pulmonary disease with (acute) exacerbation: Secondary | ICD-10-CM | POA: Diagnosis present

## 2012-10-09 DIAGNOSIS — A088 Other specified intestinal infections: Secondary | ICD-10-CM | POA: Diagnosis present

## 2012-10-09 DIAGNOSIS — D638 Anemia in other chronic diseases classified elsewhere: Secondary | ICD-10-CM | POA: Diagnosis present

## 2012-10-09 DIAGNOSIS — C3411 Malignant neoplasm of upper lobe, right bronchus or lung: Secondary | ICD-10-CM | POA: Diagnosis present

## 2012-10-09 DIAGNOSIS — D702 Other drug-induced agranulocytosis: Secondary | ICD-10-CM

## 2012-10-09 DIAGNOSIS — Z9221 Personal history of antineoplastic chemotherapy: Secondary | ICD-10-CM

## 2012-10-09 DIAGNOSIS — I498 Other specified cardiac arrhythmias: Secondary | ICD-10-CM | POA: Diagnosis not present

## 2012-10-09 HISTORY — DX: Rheumatoid arthritis, unspecified: M06.9

## 2012-10-09 HISTORY — DX: Hyperlipidemia, unspecified: E78.5

## 2012-10-09 HISTORY — DX: Unspecified asthma, uncomplicated: J45.909

## 2012-10-09 HISTORY — DX: Diaphragmatic hernia without obstruction or gangrene: K44.9

## 2012-10-09 HISTORY — DX: Malignant neoplasm of unspecified part of unspecified bronchus or lung: C34.90

## 2012-10-09 LAB — CBC WITH DIFFERENTIAL/PLATELET
Basophils Absolute: 0 10*3/uL (ref 0.0–0.1)
HCT: 28.2 % — ABNORMAL LOW (ref 36.0–46.0)
Hemoglobin: 9.7 g/dL — ABNORMAL LOW (ref 12.0–15.0)
Lymphocytes Relative: 25 % (ref 12–46)
Monocytes Absolute: 0.8 10*3/uL (ref 0.1–1.0)
Neutro Abs: 2.4 10*3/uL (ref 1.7–7.7)
RDW: 19.9 % — ABNORMAL HIGH (ref 11.5–15.5)
WBC: 4.3 10*3/uL (ref 4.0–10.5)

## 2012-10-09 LAB — URINALYSIS, ROUTINE W REFLEX MICROSCOPIC
Bilirubin Urine: NEGATIVE
Glucose, UA: NEGATIVE mg/dL
Hgb urine dipstick: NEGATIVE
Ketones, ur: NEGATIVE mg/dL
Protein, ur: NEGATIVE mg/dL

## 2012-10-09 LAB — COMPREHENSIVE METABOLIC PANEL
ALT: 12 U/L (ref 0–35)
AST: 20 U/L (ref 0–37)
Alkaline Phosphatase: 75 U/L (ref 39–117)
CO2: 23 mEq/L (ref 19–32)
Chloride: 97 mEq/L (ref 96–112)
Creatinine, Ser: 0.79 mg/dL (ref 0.50–1.10)
GFR calc non Af Amer: 81 mL/min — ABNORMAL LOW (ref >90)
Potassium: 3.8 mEq/L (ref 3.5–5.1)
Total Bilirubin: 0.4 mg/dL (ref 0.3–1.2)

## 2012-10-09 LAB — EXPECTORATED SPUTUM ASSESSMENT W GRAM STAIN, RFLX TO RESP C

## 2012-10-09 LAB — STREP PNEUMONIAE URINARY ANTIGEN: Strep Pneumo Urinary Antigen: NEGATIVE

## 2012-10-09 MED ORDER — HYDROMORPHONE HCL PF 1 MG/ML IJ SOLN
1.0000 mg | Freq: Once | INTRAMUSCULAR | Status: AC
  Administered 2012-10-09: 1 mg via INTRAVENOUS
  Filled 2012-10-09: qty 1

## 2012-10-09 MED ORDER — CALCIUM CARBONATE-VITAMIN D 500-200 MG-UNIT PO TABS
1.0000 | ORAL_TABLET | Freq: Every day | ORAL | Status: DC
  Administered 2012-10-09 – 2012-10-12 (×4): 1 via ORAL
  Filled 2012-10-09 (×4): qty 1

## 2012-10-09 MED ORDER — AZITHROMYCIN 500 MG IV SOLR
500.0000 mg | Freq: Once | INTRAVENOUS | Status: AC
  Administered 2012-10-09: 500 mg via INTRAVENOUS
  Filled 2012-10-09: qty 500

## 2012-10-09 MED ORDER — DEXTROSE 5 % IV SOLN
1.0000 g | Freq: Once | INTRAVENOUS | Status: AC
  Administered 2012-10-09: 1 g via INTRAVENOUS
  Filled 2012-10-09: qty 10

## 2012-10-09 MED ORDER — DEXTROSE 5 % IV SOLN
1.0000 g | INTRAVENOUS | Status: DC
  Administered 2012-10-10 – 2012-10-11 (×2): 1 g via INTRAVENOUS
  Filled 2012-10-09 (×3): qty 10

## 2012-10-09 MED ORDER — IPRATROPIUM BROMIDE 0.02 % IN SOLN
0.5000 mg | Freq: Four times a day (QID) | RESPIRATORY_TRACT | Status: DC
  Administered 2012-10-09 – 2012-10-12 (×9): 0.5 mg via RESPIRATORY_TRACT
  Filled 2012-10-09 (×10): qty 2.5

## 2012-10-09 MED ORDER — ACETAMINOPHEN 325 MG PO TABS
650.0000 mg | ORAL_TABLET | ORAL | Status: DC | PRN
  Administered 2012-10-09: 650 mg via ORAL
  Filled 2012-10-09: qty 2

## 2012-10-09 MED ORDER — ACETAMINOPHEN 325 MG PO TABS
ORAL_TABLET | ORAL | Status: AC
  Filled 2012-10-09: qty 2

## 2012-10-09 MED ORDER — ACETAMINOPHEN 325 MG PO TABS
650.0000 mg | ORAL_TABLET | Freq: Once | ORAL | Status: AC
  Administered 2012-10-09: 650 mg via ORAL

## 2012-10-09 MED ORDER — SODIUM CHLORIDE 0.9 % IV SOLN: 1000.0000 mL | INTRAVENOUS | Status: DC

## 2012-10-09 MED ORDER — ENOXAPARIN SODIUM 40 MG/0.4ML ~~LOC~~ SOLN
40.0000 mg | SUBCUTANEOUS | Status: DC
  Administered 2012-10-09 – 2012-10-11 (×3): 40 mg via SUBCUTANEOUS
  Filled 2012-10-09 (×4): qty 0.4

## 2012-10-09 MED ORDER — ALBUTEROL SULFATE (5 MG/ML) 0.5% IN NEBU
2.5000 mg | INHALATION_SOLUTION | Freq: Four times a day (QID) | RESPIRATORY_TRACT | Status: DC
  Administered 2012-10-09 – 2012-10-10 (×5): 2.5 mg via RESPIRATORY_TRACT
  Filled 2012-10-09 (×5): qty 0.5

## 2012-10-09 MED ORDER — SODIUM CHLORIDE 0.9 % IV SOLN
1000.0000 mL | Freq: Once | INTRAVENOUS | Status: AC
  Administered 2012-10-09: 1000 mL via INTRAVENOUS

## 2012-10-09 MED ORDER — OXYCODONE-ACETAMINOPHEN 5-325 MG PO TABS
1.0000 | ORAL_TABLET | ORAL | Status: DC | PRN
  Administered 2012-10-10: 1 via ORAL
  Administered 2012-10-10: 2 via ORAL
  Administered 2012-10-10: 1 via ORAL
  Administered 2012-10-11 – 2012-10-12 (×3): 2 via ORAL
  Filled 2012-10-09 (×3): qty 2
  Filled 2012-10-09 (×2): qty 1
  Filled 2012-10-09: qty 2

## 2012-10-09 MED ORDER — FLUTICASONE PROPIONATE HFA 44 MCG/ACT IN AERO
1.0000 | INHALATION_SPRAY | Freq: Two times a day (BID) | RESPIRATORY_TRACT | Status: DC
  Administered 2012-10-09 – 2012-10-12 (×6): 1 via RESPIRATORY_TRACT
  Filled 2012-10-09: qty 10.6

## 2012-10-09 MED ORDER — SODIUM CHLORIDE 0.9 % IV SOLN
1000.0000 mL | INTRAVENOUS | Status: DC
  Administered 2012-10-09: 1000 mL via INTRAVENOUS

## 2012-10-09 MED ORDER — SIMVASTATIN 5 MG PO TABS
5.0000 mg | ORAL_TABLET | Freq: Every day | ORAL | Status: DC
  Administered 2012-10-09 – 2012-10-11 (×3): 5 mg via ORAL
  Filled 2012-10-09 (×4): qty 1

## 2012-10-09 MED ORDER — FOLIC ACID 0.5 MG HALF TAB
400.0000 ug | ORAL_TABLET | Freq: Every day | ORAL | Status: DC
  Administered 2012-10-09 – 2012-10-12 (×4): 0.5 mg via ORAL
  Filled 2012-10-09 (×4): qty 1

## 2012-10-09 MED ORDER — ONDANSETRON HCL 4 MG/2ML IJ SOLN
4.0000 mg | Freq: Four times a day (QID) | INTRAMUSCULAR | Status: DC | PRN
  Administered 2012-10-10: 4 mg via INTRAVENOUS
  Filled 2012-10-09: qty 2

## 2012-10-09 MED ORDER — DEXTROSE 5 % IV SOLN
500.0000 mg | INTRAVENOUS | Status: DC
  Administered 2012-10-10 – 2012-10-11 (×2): 500 mg via INTRAVENOUS
  Filled 2012-10-09 (×4): qty 500

## 2012-10-09 MED ORDER — DEXTROSE 5 % IV SOLN: 1.0000 g | INTRAVENOUS | Status: DC

## 2012-10-09 MED ORDER — PREDNISONE 5 MG PO TABS
5.0000 mg | ORAL_TABLET | Freq: Every day | ORAL | Status: DC
  Administered 2012-10-09 – 2012-10-12 (×4): 5 mg via ORAL
  Filled 2012-10-09 (×4): qty 1

## 2012-10-09 MED ORDER — SODIUM CHLORIDE 0.9 % IV SOLN
INTRAVENOUS | Status: DC
  Administered 2012-10-09 – 2012-10-10 (×2): via INTRAVENOUS

## 2012-10-09 MED ORDER — ONDANSETRON HCL 4 MG/2ML IJ SOLN
4.0000 mg | Freq: Once | INTRAMUSCULAR | Status: AC
  Administered 2012-10-09: 4 mg via INTRAVENOUS
  Filled 2012-10-09: qty 2

## 2012-10-09 MED ORDER — DEXTROSE 5 % IV SOLN: 500.0000 mg | INTRAVENOUS | Status: DC

## 2012-10-09 NOTE — ED Provider Notes (Signed)
History     CSN: [id number]  Arrival date & time 10/09/12  1240   First MD Initiated Contact with Patient 10/09/12 1248      Chief Complaint  Patient presents with  . Shortness of Breath  . Chest Pain    (Consider location/radiation/quality/duration/timing/severity/associated sxs/prior treatment) HPI Comments: Destiny Harrison is a 71 y.o. Female who is here for evaluation of fever, and chest pain. The discomfort started today. She has not had recent chemotherapy. She is getting adjuvant chemotherapy for lung cancer. She has not been recently hospitalized. She denies cough. She feels short of breath.  No recent weakness, or dizziness. She was reportedly eating well before today.  Level V Caveat-severe illness  The history is provided by the patient.    Past Medical History  Diagnosis Date  . COPD (chronic obstructive pulmonary disease)   . Non-small cell lung cancer   . Neutropenia, drug-induced 05/05/2012  . Hyperlipidemia     Past Surgical History  Procedure Date  . Appendex 1962  . Video bronchoscopy 01/28/2012    Procedure: VIDEO BRONCHOSCOPY WITHOUT FLUORO;  Surgeon: Kalman Shan, MD;  Location: [facility] ENDOSCOPY;  Service: Endoscopy;  Laterality: Bilateral;  . Surgery on right wrist     Family History  Problem Relation Age of Onset  . Diabetes Mother     insulin dependent  . Breast cancer Mother     History  Substance Use Topics  . Smoking status: Former Smoker -- 0.5 packs/day for 40 years    Types: Cigarettes    Quit date: 10/29/2009  . Smokeless tobacco: Never Used  . Alcohol Use: No    OB History    Grav Para Term Preterm Abortions TAB SAB Ect Mult Living                  Review of Systems  Unable to perform ROS   Allergies  Review of patient's allergies indicates no known allergies.  Home Medications   Current Outpatient Rx  Name  Route  Sig  Dispense  Refill  . ALBUTEROL SULFATE HFA 108 (90 BASE) MCG/ACT IN AERS   Inhalation   Inhale 2  puffs into the lungs every 3 (three) hours as needed. WHEEZING AND SHORTNESS OF BREATH         . ALBUTEROL SULFATE (2.5 MG/3ML) 0.083% IN NEBU      1 vial in neb four times daily with Ipratropium.  May add 1 vial every 3 hours as needed for wheezing/shortness of breath         . BECLOMETHASONE DIPROPIONATE 80 MCG/ACT IN AERS   Inhalation   Inhale 2 puffs into the lungs 2 (two) times daily.         Marland Kitchen CALCIUM CARBONATE-VITAMIN D 500-200 MG-UNIT PO TABS   Oral   Take 1 tablet by mouth daily.         Marland Kitchen FOLIC ACID 400 MCG PO TABS   Oral   Take 400 mcg by mouth daily.         . IPRATROPIUM BROMIDE 0.02 % IN SOLN      1 vial in neb four times daily with Albuterol         . PREDNISONE 5 MG PO TABS   Oral   Take 5 mg by mouth daily.           Marland Kitchen SIMVASTATIN 5 MG PO TABS   Oral   Take 5 mg by mouth at bedtime.  BP 134/57  Pulse 117  Temp 101.9 F (38.8 C) (Rectal)  Resp 20  SpO2 97%  Physical Exam  Nursing note and vitals reviewed. Constitutional: She is oriented to person, place, and time. She appears well-developed.       Frail, elderly  HENT:  Head: Normocephalic and atraumatic.  Eyes: Conjunctivae normal and EOM are normal. Pupils are equal, round, and reactive to light.  Neck: Normal range of motion and phonation normal. Neck supple.  Cardiovascular: Regular rhythm and intact distal pulses.        Tachycardic  Pulmonary/Chest: She is in respiratory distress (Mild). She exhibits no tenderness.       Tachypnea  Abdominal: Soft. She exhibits no distension. There is no tenderness. There is no guarding.  Musculoskeletal: Normal range of motion.  Neurological: She is alert and oriented to person, place, and time. She has normal strength. She exhibits normal muscle tone.  Skin: Skin is warm and dry.  Psychiatric: She has a normal mood and affect. Her behavior is normal. Judgment and thought content normal.    ED Course  Procedures (including  critical care time)  Initial evaluation (12:50)- SIRS positive, no frank sepsis. Blood pressure soft. She is mentating well.  Emergency department treatment: IV fluids 2 L bolus, and maintenance 125 cc per hour.  Analgesia with Dilaudid, and Zofran for antiemetic  Tylenol, for fever.  Rocephin, and Zithromax started for treatment of infection. Suspected combined pneumonia and UTI are sources  Reassessment: 15:30. The patient feels better, her heart rate has improved to 120. Blood pressure, 132/61      Date: 08/07/2012  Rate: 139  Rhythm: sinus tachycardia  QRS Axis: normal  PR and QT Intervals: normal  ST/T Wave abnormalities: nonspecific T wave changes  PR and QRS Conduction Disutrbances:none  Narrative Interpretation:   Old EKG Reviewed: none available   CRITICAL CARE Performed by: Mancel Bale L   Total critical care time: 35 min  Critical care time was exclusive of separately billable procedures and treating other patients.  Critical care was necessary to treat or prevent imminent or life-threatening deterioration.  Critical care was time spent personally by me on the following activities: development of treatment plan with patient and/or surrogate as well as nursing, discussions with consultants, evaluation of patient's response to treatment, examination of patient, obtaining history from patient or surrogate, ordering and performing treatments and interventions, ordering and review of laboratory studies, ordering and review of radiographic studies, pulse oximetry and re-evaluation of patient's condition.       Labs Reviewed  CBC WITH DIFFERENTIAL - Abnormal; Notable for the following:    RBC 3.31 (*)     Hemoglobin 9.7 (*)     HCT 28.2 (*)     RDW 19.9 (*)     Monocytes Relative 18 (*)     All other components within normal limits  COMPREHENSIVE METABOLIC PANEL - Abnormal; Notable for the following:    Glucose, Bld 136 (*)     GFR calc non Af Amer 81 (*)      All other components within normal limits  LACTIC ACID, PLASMA - Abnormal; Notable for the following:    Lactic Acid, Venous 3.3 (*)     All other components within normal limits  URINALYSIS, ROUTINE W REFLEX MICROSCOPIC - Abnormal; Notable for the following:    APPearance CLOUDY (*)     pH 8.5 (*)     Leukocytes, UA MODERATE (*)     All other components  within normal limits  URINE MICROSCOPIC-ADD ON  CULTURE, BLOOD (ROUTINE X 2)  CULTURE, BLOOD (ROUTINE X 2)  URINE CULTURE  GRAM STAIN  CULTURE, EXPECTORATED SPUTUM-ASSESSMENT   Ct Chest W Contrast  10/08/2012  *RADIOLOGY REPORT*  Clinical Data:  Metastatic lung cancer  CT CHEST, ABDOMEN AND PELVIS WITH CONTRAST  Technique:  Multidetector CT imaging of the chest, abdomen and pelvis was performed following the standard protocol during bolus administration of intravenous contrast.  Contrast: OMNIPAQUE IOHEXOL 300 MG/ML  SOLN  Comparison:  07/30/2012  CT CHEST  Findings:  Lungs/pleura: Moderate changes of centrilobular emphysema identified. Left apical scarring is identified.  Stable small nodule in the posterior right upper lobe measuring 4 mm, image number 14.  No new or enlarging pulmonary nodules or masses.  Heart/Mediastinum: Normal heart size.  No pericardial effusion. Multiple calcified mediastinal lymph nodes identified. Noncalcified right paratracheal lymph node measures 2.7 x 2.2 cm, image 16.  Previously 2.4 x 1.7 cm.  Bones/Musculoskeletal:  Right supraclavicular lymph node measures 1.3 cm, image 10.  Previously 1.6 cm.  Low attenuation nodules in the right lobe of thyroid gland appears similar to previous exam. No aggressive lytic or sclerotic bone lesions identified.  IMPRESSION:  1. No acute findings.  2.  Metastatic lymph node within the right paratracheal region is slightly increased in size from previous exam. 3.  Decrease in size of right supraclavicular lymph node.  CT ABDOMEN AND PELVIS  Findings:  Hyperattenuating  structure within the anterior right hepatic lobe, image 59, is stable from previous exam and likely represents a vascular anomaly.  The spleen is normal.  The gallbladder is normal.  No biliary dilatation.  Normal appearance of the pancreas.  The adrenal glands are both normal.  Normal appearance of the kidneys.  Urinary bladder appears normal for degree of distention. Uterus and adnexal structures are unremarkable.  No enlarged upper abdominal lymph nodes.  There is no pelvic or inguinal adenopathy.  The stomach and the small bowel loops appear normal.  Normal appearance of the colon.  Pagetoid changes involving the proximal right femur noted.  This is unchanged from previous exam.  Multilevel spondylosis noted within the lower lumbar spine.  IMPRESSION:  1.  No evidence for soft tissue metastasis to the abdomen or pelvis. 2.  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   Original Report Authenticated By: Signa Kell, M.D.    Ct Abdomen Pelvis W Contrast  10/08/2012  *RADIOLOGY REPORT*  Clinical Data:  Metastatic lung cancer  CT CHEST, ABDOMEN AND PELVIS WITH CONTRAST  Technique:  Multidetector CT imaging of the chest, abdomen and pelvis was performed following the standard protocol during bolus administration of intravenous contrast.  Contrast: OMNIPAQUE IOHEXOL 300 MG/ML  SOLN  Comparison:  07/30/2012  CT CHEST  Findings:  Lungs/pleura: Moderate changes of centrilobular emphysema identified. Left apical scarring is identified.  Stable small nodule in the posterior right upper lobe measuring 4 mm, image number 14.  No new or enlarging pulmonary nodules or masses.  Heart/Mediastinum: Normal heart size.  No pericardial effusion. Multiple calcified mediastinal lymph nodes identified. Noncalcified right paratracheal lymph node measures 2.7 x 2.2 cm, image 16.  Previously 2.4 x 1.7 cm.  Bones/Musculoskeletal:  Right supraclavicular lymph node measures 1.3 cm, image 10.  Previously 1.6 cm.  Low attenuation  nodules in the right lobe of thyroid gland appears similar to previous exam. No aggressive lytic or sclerotic bone lesions identified.  IMPRESSION:  1. No acute  findings.  2.  Metastatic lymph node within the right paratracheal region is slightly increased in size from previous exam. 3.  Decrease in size of right supraclavicular lymph node.  CT ABDOMEN AND PELVIS  Findings:  Hyperattenuating structure within the anterior right hepatic lobe, image 59, is stable from previous exam and likely represents a vascular anomaly.  The spleen is normal.  The gallbladder is normal.  No biliary dilatation.  Normal appearance of the pancreas.  The adrenal glands are both normal.  Normal appearance of the kidneys.  Urinary bladder appears normal for degree of distention. Uterus and adnexal structures are unremarkable.  No enlarged upper abdominal lymph nodes.  There is no pelvic or inguinal adenopathy.  The stomach and the small bowel loops appear normal.  Normal appearance of the colon.  Pagetoid changes involving the proximal right femur noted.  This is unchanged from previous exam.  Multilevel spondylosis noted within the lower lumbar spine.  IMPRESSION:  1.  No evidence for soft tissue metastasis to the abdomen or pelvis. 2.  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   Original Report Authenticated By: Signa Kell, M.D.    Ct Femur Right W Contrast  10/08/2012  *RADIOLOGY REPORT*  Clinical Data: Mid right femoral mass. Lung cancer.  CT OF THE RIGHT LOWER EXTREMITY WITH CONTRAST  Technique:  Contiguous axial images were obtained through the right femur after IV contrast administration.  Coronal and sagittal reconstructions were created.  Contrast: OMNIPAQUE IOHEXOL 300 MG/ML  SOLN  Comparison: 07/30/2012.  12/28/2009 bone scan.  Findings: Paget's disease of the right femur is present, more prominent proximally and distally but affecting the head, neck, proximal metaphysis and diaphysis.  There is no pathologic  fracture.  No evidence of sarcomatous transformation.  Osteopenia is present.  There is no mass in the anterior right thigh.  There is a cutaneous marker over the anterior quadriceps however there is no mass lesion identified in this region.  Muscular architecture appears within normal limits.  Neurovascular bundles appear normal. Posterior muscular compartment and adductor muscular compartments appear normal. Speckled areas of sclerosis in the distal femoral metaphysis likely represent either calcified enchondroma or bone infarct.  IMPRESSION: No bony or soft tissue mass in the right thigh.  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   Original Report Authenticated By: Andreas Newport, M.D.    Dg Chest Port 1 View  10/09/2012  *RADIOLOGY REPORT*  Clinical Data: Shortness of breath, chest pain, stage IV lung cancer on chemotherapy, COPD  PORTABLE CHEST - 1 VIEW  Comparison: Portable exam 1403 hrs correlated with prior CT chest of 10/08/2012  Findings: Right jugular Port-A-Cath tip projecting over SVC. Rotated to the right. Normal heart size, mediastinal contours, pulmonary vascularity. Changes of COPD with questionable left upper lobe infiltrate. No pleural effusion or pneumothorax. Bones demineralized. Retained contrast in the splenic flexure of the colon.  IMPRESSION: COPD with questionable left upper lobe infiltrate.   Original Report Authenticated By: Ulyses Southward, M.D.      1. Febrile illness   2. UTI (lower urinary tract infection)   3. Community acquired pneumonia       MDM  Acute febrile illness, with abnormal urinalysis, and chest x-ray. No severe sepsis. Mild lactate elevation.   Plan: Admit to hospitalist     Flint Melter, MD 10/09/12 [phone]

## 2012-10-09 NOTE — ED Notes (Signed)
hospitalist at bedside

## 2012-10-09 NOTE — ED Notes (Signed)
Per pt, new chest pain this am, patient has history of left lung cancer, metz to bone-short of breath

## 2012-10-09 NOTE — H&P (Signed)
[facility] History and Physical  Destiny Harrison AVW:[id number] DOB: 21-Mar-1941 DOA: 10/09/2012  Referring physician: Dr. Gray Bernhardt PCP: [name], MD  Oncologist: Dr. Si Gaul  Chief Complaint: Fever, chills, N/V, diarrhea   History of Present Illness: Destiny Harrison is an 71 y.o. female with a PMH of non-small cell lung squamous cell carcinoma diagnosed in 12/2011, under the care of Dr. Arbutus Ped, status post palliative radiotherapy to left lung completed 03/06/12 and ongoing systemic chemotherapy who presents to the ER with fever, chills, N/V/D. The patient began to have problems with nausea, vomiting, and diarrhea approximately 24 hours ago. She last vomited this morning and her last episode of diarrhea was last night. She's been running fevers. She's had some myalgias in her back and chest. The patient does report that she has had a pneumococcal and influenza vaccine. The patient denies upper respiratory symptoms at this time. Specifically, she denies dyspnea, cough, nasal congestion, and pharyngitis. Her last chemotherapy was on 09/22/2012. She was supposed to have a treatment on 12/10/213, but this was apparently canceled due to low blood counts.  Review of Systems: Constitutional: + fever and chills;  Appetite normal; No weight loss, no weight gain.  HEENT: No blurry vision, no diplopia, no pharyngitis, no dysphagia CV: No chest pain, no palpitations.  Resp: No SOB, no cough. GI: + nausea and vomiting, + diarrhea, no melena, no hematochezia.  GU: No dysuria, no hematuria.  MSK: + myalgias, no arthralgias.  Neuro:  No headache, no focal neurological deficits, no history of seizures.  Psych: No depression, no anxiety.  Endo: No thyroid disease, no DM, no heat intolerance, no cold intolerance, no polyuria, no polydipsia  Skin: No rashes, no skin lesions.  Heme: No easy bruising, no history of blood diseases.  Past Medical History Past Medical History  Diagnosis Date  . COPD  (chronic obstructive pulmonary disease)   . Non-small cell lung cancer   . Neutropenia, drug-induced 05/05/2012  . Hyperlipidemia   . Rheumatoid arthritis   . Asthma   . Hiatal hernia      Past Surgical History Past Surgical History  Procedure Date  . Appendex 1962  . Video bronchoscopy 01/28/2012    Procedure: VIDEO BRONCHOSCOPY WITHOUT FLUORO;  Surgeon: Kalman Shan, MD;  Location: [facility];  Service: Endoscopy;  Laterality: Bilateral;  . Surgery on right wrist      Social History: History   Social History  . Marital Status: Married    Spouse Name: N/A    Number of Children: 4  . Years of Education: N/A   Occupational History  . retired      Technical brewer  .     Social History Main Topics  . Smoking status: Former Smoker -- 0.5 packs/day for 40 years    Types: Cigarettes    Quit date: 10/29/2009  . Smokeless tobacco: Never Used  . Alcohol Use: No  . Drug Use: No  . Sexually Active: Not on file   Other Topics Concern  . Not on file   Social History Narrative   Spiritual person. Member of [employer] at [employer] where she lives since age 42. Never missed a sermon. Sings in choir. CHildren go to same church but husband does not. Stated 10/30/11 she is not afraid of lung cancer diagnosis or prognosis because it is in "god's hand" and "god's will".     Family History:  Family History  Problem Relation Age of Onset  . Diabetes Mother  insulin dependent  . Breast cancer Mother     Allergies: Review of patient's allergies indicates no known allergies.  Meds: Prior to Admission medications   Medication Sig Start Date End Date Taking? Authorizing Provider  albuterol (PROVENTIL HFA;VENTOLIN HFA) 108 (90 BASE) MCG/ACT inhaler Inhale 2 puffs into the lungs every 3 (three) hours as needed. WHEEZING AND SHORTNESS OF BREATH   Yes Historical Provider, MD  albuterol (PROVENTIL) (2.5 MG/3ML) 0.083% nebulizer solution 1 vial in neb four times  daily with Ipratropium.  May add 1 vial every 3 hours as needed for wheezing/shortness of breath 03/24/12 03/24/13 Yes Kalman Shan, MD  beclomethasone (QVAR) 80 MCG/ACT inhaler Inhale 2 puffs into the lungs 2 (two) times daily.   Yes Historical Provider, MD  calcium-vitamin D (OSCAL WITH D) 500-200 MG-UNIT per tablet Take 1 tablet by mouth daily.   Yes Historical Provider, MD  folic acid (FOLVITE) 400 MCG tablet Take 400 mcg by mouth daily.   Yes Historical Provider, MD  ipratropium (ATROVENT) 0.02 % nebulizer solution 1 vial in neb four times daily with Albuterol 03/24/12 03/24/13 Yes Kalman Shan, MD  predniSONE (DELTASONE) 5 MG tablet Take 5 mg by mouth daily.     Yes Historical Provider, MD  simvastatin (ZOCOR) 5 MG tablet Take 5 mg by mouth at bedtime.   Yes Historical Provider, MD    Physical Exam: Filed Vitals:   10/09/12 1545 10/09/12 1600 10/09/12 1615 10/09/12 1630  BP: 134/57 118/60 121/58 110/58  Pulse: 117 117 116 114  Temp: 101.9 F (38.8 C) 101.8 F (38.8 C) 101.5 F (38.6 C) 101.1 F (38.4 C)  TempSrc:      Resp: 20 21 21 18   SpO2: 97% 97% 98% 98%     Physical Exam: Blood pressure 110/58, pulse 114, temperature 101.1 F (38.4 C), temperature source Rectal, resp. rate 18, SpO2 98.00%. Gen: No acute distress. Head: Normocephalic, atraumatic. Eyes: Pupils equal, round, reactive to light and accommodation. Extraocular movements intact. Sclerae nonicteric. Mouth: Edentulous with dentures in. No evidence of thrush or mucositis. Neck: Supple, no thyromegaly, no lymphadenopathy, no jugular venous distention. Chest: Diminished breath sounds. CV: Tachycardic rate, regular rhythm. No murmurs, rubs, or gallops. Abdomen: Soft, nontender, nondistended with normal active bowel sounds. Extremities: No clubbing, edema, or cyanosis. Skin: Warm and dry. Neuro: Alert and oriented x3. Cranial nerves II through XII are grossly intact. Nonfocal. Psych: Mood and affect normal.  Labs  on Admission:  Basic Metabolic Panel:  Lab 10/09/12 1610 10/06/12 1017  NA 135 140  K 3.8 3.9  CL 97 103  CO2 23 27  GLUCOSE 136* 99  BUN 10 10.0  CREATININE 0.79 0.8  CALCIUM 9.0 9.0  MG -- --  PHOS -- --   Liver Function Tests:  Lab 10/09/12 1252 10/06/12 1017  AST 20 21  ALT 12 15  ALKPHOS 75 94  BILITOT 0.4 0.39  PROT 8.2 7.5  ALBUMIN 3.6 3.4*   CBC:  Lab 10/09/12 1252 10/06/12 1017  WBC 4.3 2.9*  NEUTROABS 2.4 0.8*  HGB 9.7* 9.1*  HCT 28.2* 26.4*  MCV 85.2 88.9  PLT 295 168    Radiological Exams on Admission: Ct Chest W Contrast  10/08/2012  *RADIOLOGY REPORT*  Clinical Data:  Metastatic lung cancer  CT CHEST, ABDOMEN AND PELVIS WITH CONTRAST  Technique:  Multidetector CT imaging of the chest, abdomen and pelvis was performed following the standard protocol during bolus administration of intravenous contrast.  Contrast: OMNIPAQUE IOHEXOL 300 MG/ML  SOLN  Comparison:  07/30/2012  CT CHEST  Findings:  Lungs/pleura: Moderate changes of centrilobular emphysema identified. Left apical scarring is identified.  Stable small nodule in the posterior right upper lobe measuring 4 mm, image number 14.  No new or enlarging pulmonary nodules or masses.  Heart/Mediastinum: Normal heart size.  No pericardial effusion. Multiple calcified mediastinal lymph nodes identified. Noncalcified right paratracheal lymph node measures 2.7 x 2.2 cm, image 16.  Previously 2.4 x 1.7 cm.  Bones/Musculoskeletal:  Right supraclavicular lymph node measures 1.3 cm, image 10.  Previously 1.6 cm.  Low attenuation nodules in the right lobe of thyroid gland appears similar to previous exam. No aggressive lytic or sclerotic bone lesions identified.  IMPRESSION:  1. No acute findings.  2.  Metastatic lymph node within the right paratracheal region is slightly increased in size from previous exam. 3.  Decrease in size of right supraclavicular lymph node.  CT ABDOMEN AND PELVIS  Findings:  Hyperattenuating  structure within the anterior right hepatic lobe, image 59, is stable from previous exam and likely represents a vascular anomaly.  The spleen is normal.  The gallbladder is normal.  No biliary dilatation.  Normal appearance of the pancreas.  The adrenal glands are both normal.  Normal appearance of the kidneys.  Urinary bladder appears normal for degree of distention. Uterus and adnexal structures are unremarkable.  No enlarged upper abdominal lymph nodes.  There is no pelvic or inguinal adenopathy.  The stomach and the small bowel loops appear normal.  Normal appearance of the colon.  Pagetoid changes involving the proximal right femur noted.  This is unchanged from previous exam.  Multilevel spondylosis noted within the lower lumbar spine.  IMPRESSION:  1.  No evidence for soft tissue metastasis to the abdomen or pelvis. 2.  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   Original Report Authenticated By: Signa Kell, M.D.    Ct Abdomen Pelvis W Contrast  10/08/2012  *RADIOLOGY REPORT*  Clinical Data:  Metastatic lung cancer  CT CHEST, ABDOMEN AND PELVIS WITH CONTRAST  Technique:  Multidetector CT imaging of the chest, abdomen and pelvis was performed following the standard protocol during bolus administration of intravenous contrast.  Contrast: OMNIPAQUE IOHEXOL 300 MG/ML  SOLN  Comparison:  07/30/2012  CT CHEST  Findings:  Lungs/pleura: Moderate changes of centrilobular emphysema identified. Left apical scarring is identified.  Stable small nodule in the posterior right upper lobe measuring 4 mm, image number 14.  No new or enlarging pulmonary nodules or masses.  Heart/Mediastinum: Normal heart size.  No pericardial effusion. Multiple calcified mediastinal lymph nodes identified. Noncalcified right paratracheal lymph node measures 2.7 x 2.2 cm, image 16.  Previously 2.4 x 1.7 cm.  Bones/Musculoskeletal:  Right supraclavicular lymph node measures 1.3 cm, image 10.  Previously 1.6 cm.  Low attenuation  nodules in the right lobe of thyroid gland appears similar to previous exam. No aggressive lytic or sclerotic bone lesions identified.  IMPRESSION:  1. No acute findings.  2.  Metastatic lymph node within the right paratracheal region is slightly increased in size from previous exam. 3.  Decrease in size of right supraclavicular lymph node.  CT ABDOMEN AND PELVIS  Findings:  Hyperattenuating structure within the anterior right hepatic lobe, image 59, is stable from previous exam and likely represents a vascular anomaly.  The spleen is normal.  The gallbladder is normal.  No biliary dilatation.  Normal appearance of the pancreas.  The adrenal glands are both normal.  Normal appearance of the kidneys.  Urinary bladder appears normal for degree of distention. Uterus and adnexal structures are unremarkable.  No enlarged upper abdominal lymph nodes.  There is no pelvic or inguinal adenopathy.  The stomach and the small bowel loops appear normal.  Normal appearance of the colon.  Pagetoid changes involving the proximal right femur noted.  This is unchanged from previous exam.  Multilevel spondylosis noted within the lower lumbar spine.  IMPRESSION:  1.  No evidence for soft tissue metastasis to the abdomen or pelvis. 2.  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   Original Report Authenticated By: Signa Kell, M.D.    Ct Femur Right W Contrast  10/08/2012  *RADIOLOGY REPORT*  Clinical Data: Mid right femoral mass. Lung cancer.  CT OF THE RIGHT LOWER EXTREMITY WITH CONTRAST  Technique:  Contiguous axial images were obtained through the right femur after IV contrast administration.  Coronal and sagittal reconstructions were created.  Contrast: OMNIPAQUE IOHEXOL 300 MG/ML  SOLN  Comparison: 07/30/2012.  12/28/2009 bone scan.  Findings: Paget's disease of the right femur is present, more prominent proximally and distally but affecting the head, neck, proximal metaphysis and diaphysis.  There is no pathologic  fracture.  No evidence of sarcomatous transformation.  Osteopenia is present.  There is no mass in the anterior right thigh.  There is a cutaneous marker over the anterior quadriceps however there is no mass lesion identified in this region.  Muscular architecture appears within normal limits.  Neurovascular bundles appear normal. Posterior muscular compartment and adductor muscular compartments appear normal. Speckled areas of sclerosis in the distal femoral metaphysis likely represent either calcified enchondroma or bone infarct.  IMPRESSION: No bony or soft tissue mass in the right thigh.  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   Original Report Authenticated By: Andreas Newport, M.D.    Dg Chest Port 1 View  10/09/2012  *RADIOLOGY REPORT*  Clinical Data: Shortness of breath, chest pain, stage IV lung cancer on chemotherapy, COPD  PORTABLE CHEST - 1 VIEW  Comparison: Portable exam 1403 hrs correlated with prior CT chest of 10/08/2012  Findings: Right jugular Port-A-Cath tip projecting over SVC. Rotated to the right. Normal heart size, mediastinal contours, pulmonary vascularity. Changes of COPD with questionable left upper lobe infiltrate. No pleural effusion or pneumothorax. Bones demineralized. Retained contrast in the splenic flexure of the colon.  IMPRESSION: COPD with questionable left upper lobe infiltrate.   Original Report Authenticated By: Ulyses Southward, M.D.     EKG: Independently reviewed. Sinus tachycardia at 139 beats per minute. Flat T waves in V1-V3.  Assessment/Plan Principal Problem:  *Febrile illness, acute in a patient who has had recent chemotherapy  The patient is not currently neutropenic but we'll need to monitor blood counts closely. If her counts begin to fall, may need Neupogen.  Will order a nasal swab for influenza PCR studies. If positive, would put on Tamiflu.  We'll treat for community-acquired pneumonia with Rocephin  and azithromycin.  Supportive care for viral gastroenteritis. Active Problems:  COPD, moderate  Continue nebulized bronchodilator therapy 4 times a day.  Lung cancer  We'll notify Dr. Arbutus Ped of the patient's admission.  Viral gastroenteritis  Should be self-limiting. We'll place on IV fluids, antinausea medication as needed.  Hyperlipidemia  Continue statin.  Community acquired pneumonia  Rocephin and azithromycin.  Blood and sputum cultures ordered. Strep pneumonia antigen and Legionella antigen ordered.  Code Status: Full. Family Communication: Husband  at bedside. Disposition Plan: Home when stable, likely 3-4 days.  Time spent: 1 hour.  RAMA,CHRISTINA [facility] Pager [phone]  If 7PM-7AM, please contact night-coverage [email or web address] Password [email or web address] 10/09/2012, 5:11 PM

## 2012-10-09 NOTE — Telephone Encounter (Signed)
PT.'S SON, BRIAN, HAS ARRIVED AT HIS MOTHER'S HOUSE. SHE IS HAVING CHILLS, NAUSEA, VOMITING, AND DIARRHEA. VERBAL ORDER AND READ BACK TO ADRENA JOHNSON,PA- PT. NEEDS TO GO TO THE EMERGENCY ROOM TO BE EVALUATED. NOTIFIED PT.'S SON. HE VOICES UNDERSTANDING.

## 2012-10-09 NOTE — Progress Notes (Signed)
Offered support to patient and family. Will follow up as necessary.

## 2012-10-10 DIAGNOSIS — R222 Localized swelling, mass and lump, trunk: Secondary | ICD-10-CM

## 2012-10-10 DIAGNOSIS — J449 Chronic obstructive pulmonary disease, unspecified: Secondary | ICD-10-CM

## 2012-10-10 DIAGNOSIS — J189 Pneumonia, unspecified organism: Principal | ICD-10-CM

## 2012-10-10 LAB — INFLUENZA PANEL BY PCR (TYPE A & B)
H1N1 flu by pcr: NOT DETECTED
Influenza A By PCR: NEGATIVE
Influenza B By PCR: NEGATIVE

## 2012-10-10 LAB — CBC
Hemoglobin: 8 g/dL — ABNORMAL LOW (ref 12.0–15.0)
Platelets: 244 10*3/uL (ref 150–400)
RBC: 2.72 MIL/uL — ABNORMAL LOW (ref 3.87–5.11)
WBC: 4.8 10*3/uL (ref 4.0–10.5)

## 2012-10-10 LAB — EXPECTORATED SPUTUM ASSESSMENT W GRAM STAIN, RFLX TO RESP C

## 2012-10-10 LAB — URINE CULTURE
Colony Count: NO GROWTH
Culture: NO GROWTH

## 2012-10-10 NOTE — Progress Notes (Signed)
Pt requesting to get OOB, one assist to help with equipment.  Foley DC'd, @ 2:15pm, no indication to keep in, will notify MD..

## 2012-10-10 NOTE — Progress Notes (Signed)
Patient ID: Destiny Harrison, female   DOB: [birth year], 71 y.o.   MRN: [id number]  [facility] PROGRESS NOTE  Destiny Harrison WUJ:[id number] DOB: 12-Mar-1941 DOA: 10/09/2012 PCP: [name], MD  Brief narrative: Destiny Harrison is an 71 y.o. female with a PMH of non-small cell lung squamous cell carcinoma diagnosed in 12/2011, under the care of Dr. Arbutus Ped, status post palliative radiotherapy to left lung completed 03/06/12 and ongoing systemic chemotherapy who presents to the ER with fever, chills, N/V/D. The patient began to have problems with nausea, vomiting, and diarrhea approximately 24 hours prior to admission. She last vomited the morning of morning and her last episode of diarrhea was one night prior to admission.  She's been running fevers. She's had some myalgias in her back and chest. The patient does report that she has had a pneumococcal and influenza vaccine. The patient denies upper respiratory symptoms at this time. Specifically, she denies dyspnea, cough, nasal congestion, and pharyngitis. Her last chemotherapy was on 09/22/2012. She was supposed to have a treatment on 12/10/213, but this was apparently canceled due to low blood counts.   Assessment/Plan  Principal Problem:  *Febrile illness, acute in a patient who has had recent chemotherapy  The patient is not currently neutropenic but we'll need to monitor blood counts closely. If her counts begin to fall, may need Neupogen.  Nasal swab for influenza PCR studies pending Continue Rocephin and azithromycin.  Supportive care for viral gastroenteritis. Active Problems:  COPD, moderate  Continue nebulized bronchodilator therapy 4 times a day. Lung cancer  We'll notify Dr. Arbutus Ped of the patient's admission. Viral gastroenteritis  Should be self-limiting. We'll continue IV fluids, antinausea medication as needed. Hyperlipidemia  Continue statin. Community acquired pneumonia  Rocephin and azithromycin.  Blood and sputum cultures  ordered. Strep pneumonia antigen and Legionella antigen ordered.  Consultants:  None  Procedures/Studies: Dg Chest Port 1 View 10/09/2012   COPD with questionable left upper lobe infiltrate.      Antibiotics:  Rocephin 12/20 -->  Zithro 12/20 -->  Code Status: Full Family Communication: Pt at bedside Disposition Plan: Home when medically stable  HPI/Subjective: No events overnight.   Objective: Filed Vitals:   10/10/12 0441 10/10/12 0900 10/10/12 0926 10/10/12 1553  BP: 126/47   112/41  Pulse: 78 96  60  Temp: 98 F (36.7 C)   97.9 F (36.6 C)  TempSrc: Oral   Oral  Resp: 18   18  Height:      Weight:      SpO2: 100%  100% 100%    Intake/Output Summary (Last 24 hours) at 10/10/12 1635 Last data filed at 10/10/12 1330  Gross per 24 hour  Intake 2211.75 ml  Output   4110 ml  Net -1898.25 ml    Exam:   General:  Pt is alert, follows commands appropriately, not in acute distress  Cardiovascular: Regular rate and rhythm, S1/S2, no murmurs, no rubs, no gallops  Respiratory: Clear to auscultation bilaterally, rales at bases  Abdomen: Soft, non tender, non distended, bowel sounds present, no guarding  Extremities: No edema, pulses DP and PT palpable bilaterally  Neuro: Grossly nonfocal  Data Reviewed: Basic Metabolic Panel:  Lab 10/09/12 9562 10/06/12 1017  NA 135 140  K 3.8 3.9  CL 97 103  CO2 23 27  GLUCOSE 136* 99  BUN 10 10.0  CREATININE 0.79 0.8  CALCIUM 9.0 9.0  MG -- --  PHOS -- --   Liver Function Tests:  Lab 10/09/12 1252  10/06/12 1017  AST 20 21  ALT 12 15  ALKPHOS 75 94  BILITOT 0.4 0.39  PROT 8.2 7.5  ALBUMIN 3.6 3.4*   CBC:  Lab 10/10/12 0514 10/09/12 1252 10/06/12 1017  WBC 4.8 4.3 2.9*  NEUTROABS -- 2.4 0.8*  HGB 8.0* 9.7* 9.1*  HCT 23.3* 28.2* 26.4*  MCV 85.7 85.2 88.9  PLT 244 295 168   Recent Results (from the past 240 hour(s))  CULTURE, BLOOD (ROUTINE X 2)     Status: Normal (Preliminary result)    Collection Time   10/09/12  1:08 PM      Component Value Range Status Comment   Specimen Description BLOOD LEFT ARM   Final    Special Requests BOTTLES DRAWN AEROBIC AND ANAEROBIC   Final    Culture  Setup Time 10/09/2012 20:22   Final    Culture     Final    Value:        BLOOD CULTURE RECEIVED NO GROWTH TO DATE CULTURE WILL BE HELD FOR 5 DAYS BEFORE ISSUING A FINAL NEGATIVE REPORT   Report Status PENDING   Incomplete   CULTURE, BLOOD (ROUTINE X 2)     Status: Normal (Preliminary result)   Collection Time   10/09/12  1:15 PM      Component Value Range Status Comment   Specimen Description BLOOD LEFT HAND   Final    Special Requests BOTTLES DRAWN AEROBIC AND ANAEROBIC 3.5CC   Final    Culture  Setup Time 10/09/2012 20:22   Final    Culture     Final    Value:        BLOOD CULTURE RECEIVED NO GROWTH TO DATE CULTURE WILL BE HELD FOR 5 DAYS BEFORE ISSUING A FINAL NEGATIVE REPORT   Report Status PENDING   Incomplete   URINE CULTURE     Status: Normal   Collection Time   10/09/12  1:34 PM      Component Value Range Status Comment   Specimen Description URINE, CATHETERIZED   Final    Special Requests Immunocompromised   Final    Culture  Setup Time 10/09/2012 19:05   Final    Colony Count NO GROWTH   Final    Culture NO GROWTH   Final    Report Status 10/10/2012 FINAL   Final   CULTURE, EXPECTORATED SPUTUM-ASSESSMENT     Status: Normal   Collection Time   10/09/12  4:58 PM      Component Value Range Status Comment   Specimen Description SPUTUM   Final    Special Requests Immunocompromised   Final    Sputum evaluation     Final    Value: MICROSCOPIC FINDINGS SUGGEST THAT THIS SPECIMEN IS NOT REPRESENTATIVE OF LOWER RESPIRATORY SECRETIONS. PLEASE RECOLLECT.     CALLED TO Jan Fireman RN @ 1859 10/09/12 WELLS,D.   Report Status 10/09/2012 FINAL   Final   CULTURE, EXPECTORATED SPUTUM-ASSESSMENT     Status: Normal   Collection Time   10/10/12  4:45 AM      Component Value Range  Status Comment   Specimen Description SPUTUM   Final    Special Requests Immunocompromised   Final    Sputum evaluation     Final    Value: MICROSCOPIC FINDINGS SUGGEST THAT THIS SPECIMEN IS NOT REPRESENTATIVE OF LOWER RESPIRATORY SECRETIONS. PLEASE RECOLLECT.     B SHAW AT 0455 ON 12.21.2013 BY NBROOKS   Report Status 10/10/2012 FINAL  Final      Scheduled Meds:   . albuterol  2.5 mg Nebulization QID  . azithromycin  500 mg Intravenous Q24H  . calcium-vitamin D  1 tablet Oral Daily  . cefTRIAXone (ROCEPHIN)  IV  1 g Intravenous Q24H  . enoxaparin (LOVENOX) injection  40 mg Subcutaneous Q24H  . fluticasone  1 puff Inhalation BID  . folic acid  400 mcg Oral Daily  . ipratropium  0.5 mg Nebulization QID  . predniSONE  5 mg Oral Daily  . simvastatin  5 mg Oral QHS   Continuous Infusions:   . sodium chloride 75 mL/hr at 10/10/12 1328  . sodium chloride       Debbora Presto, MD  [facility] Pager [phone]  If 7PM-7AM, please contact night-coverage [email or web address] Password [facility] 10/10/2012, 4:35 PM   LOS: 1 day

## 2012-10-10 NOTE — Plan of Care (Signed)
Problem: Phase I Progression Outcomes Goal: OOB as tolerated unless otherwise ordered Outcome: Progressing OOB with 1 assist.  Pt verbalizes that she prefers to sit up. Goal: Voiding-avoid urinary catheter unless indicated Foley DC'd at 2:15pm, due to void between 8-10pm.  Problem: Phase II Progression Outcomes Goal: Review culture results with MD if needed Influenza A and B negative.  H1N1 negative as well.  Will notify MD upon rounding.

## 2012-10-11 LAB — CBC
MCHC: 33.8 g/dL (ref 30.0–36.0)
Platelets: 265 10*3/uL (ref 150–400)
RDW: 19.6 % — ABNORMAL HIGH (ref 11.5–15.5)
WBC: 4.2 10*3/uL (ref 4.0–10.5)

## 2012-10-11 LAB — BASIC METABOLIC PANEL
BUN: 8 mg/dL (ref 6–23)
Chloride: 102 mEq/L (ref 96–112)
GFR calc Af Amer: >90 mL/min (ref >90)
GFR calc non Af Amer: 84 mL/min — ABNORMAL LOW (ref >90)
Potassium: 3.5 mEq/L (ref 3.5–5.1)
Sodium: 136 mEq/L (ref 135–145)

## 2012-10-11 LAB — LEGIONELLA ANTIGEN, URINE: Legionella Antigen, Urine: NEGATIVE

## 2012-10-11 MED ORDER — ALBUTEROL SULFATE (5 MG/ML) 0.5% IN NEBU
2.5000 mg | INHALATION_SOLUTION | Freq: Four times a day (QID) | RESPIRATORY_TRACT | Status: DC
  Administered 2012-10-11 – 2012-10-12 (×5): 2.5 mg via RESPIRATORY_TRACT
  Filled 2012-10-11 (×6): qty 0.5

## 2012-10-11 MED ORDER — SODIUM CHLORIDE 0.9 % IJ SOLN
10.0000 mL | INTRAMUSCULAR | Status: DC | PRN
  Administered 2012-10-12 (×2): 10 mL

## 2012-10-11 MED ORDER — POLYETHYLENE GLYCOL 3350 17 G PO PACK
17.0000 g | PACK | Freq: Every day | ORAL | Status: DC
  Administered 2012-10-11: 17 g via ORAL
  Filled 2012-10-11 (×2): qty 1

## 2012-10-11 MED ORDER — ALBUTEROL SULFATE (5 MG/ML) 0.5% IN NEBU: 2.5000 mg | INHALATION_SOLUTION | RESPIRATORY_TRACT | Status: DC | PRN

## 2012-10-11 MED ORDER — BOOST PLUS PO LIQD
237.0000 mL | Freq: Three times a day (TID) | ORAL | Status: DC | PRN
  Filled 2012-10-11: qty 237

## 2012-10-11 MED ORDER — ALBUTEROL SULFATE (5 MG/ML) 0.5% IN NEBU: 2.5000 mg | INHALATION_SOLUTION | Freq: Four times a day (QID) | RESPIRATORY_TRACT | Status: DC | PRN

## 2012-10-11 NOTE — Progress Notes (Signed)
INITIAL NUTRITION ASSESSMENT  DOCUMENTATION CODES Per approved criteria  -Not Applicable   INTERVENTION: Boost prn Encouraged intake.  NUTRITION DIAGNOSIS: Inadequate oral intake related to resolving gastroenteritis as evidenced by documented intake.   Goal: Intake of >75% meals.  Monitor:  Intake, labs, weight trend  Reason for Assessment: Nutrition Risk  71 y.o. female  Admitting Dx: Febrile illness, acute, pna, viral gastroenteritis  ASSESSMENT: PMH of non-small cell lung squamous cell carcinoma diagnosed in 12/2011, under the care of Dr. Arbutus Ped, status post palliative radiotherapy to left lung completed 03/06/12 and ongoing systemic chemotherapy who presents to the ER with fever, chills, N/V/D.  [facility] spoke with [facility] 09/22/12.  Patient had good intake at that time.  Weight of 133.4 lbs which was within her UBW range.  Had been receiving Boost and encouragement for small frequent meals.  Patient reports tolerating meals well now with no complaints.  Weight is maintained within normal range.  Eats 3-4 small meals daily and drinks 2-3 Boost daily at home.  Does not want Boost currently but will order prn if she changes her mind.  Height: Ht Readings from Last 1 Encounters:  10/09/12 5\' 5"  (1.651 m)    Weight: Wt Readings from Last 1 Encounters:  10/09/12 133 lb (60.328 kg)    Ideal Body Weight: 57 kg (125#)  % Ideal Body Weight: 106  Wt Readings from Last 10 Encounters:  10/09/12 133 lb (60.328 kg)  09/22/12 133 lb 6.4 oz (60.51 kg)  09/01/12 132 lb 11.2 oz (60.192 kg)  08/06/12 134 lb 12.8 oz (61.145 kg)  08/04/12 132 lb 9.6 oz (60.147 kg)  07/23/12 136 lb (61.689 kg)  07/07/12 134 lb 14.4 oz (61.19 kg)  06/16/12 136 lb 3.2 oz (61.78 kg)  05/12/12 133 lb 1.6 oz (60.374 kg)  04/21/12 135 lb 4.8 oz (61.372 kg)    Usual Body Weight: 133  % Usual Body Weight: 100  BMI:  Body mass index is 22.13 kg/(m^2).  Estimated Nutritional Needs: Kcal:  1700-1800 Protein: 70-80 gm Fluid: 1.7-1.8L  Skin: intact  Diet Order: General  EDUCATION NEEDS: -No education needs identified at this time   Intake/Output Summary (Last 24 hours) at 10/11/12 1229 Last data filed at 10/11/12 0929  Gross per 24 hour  Intake   2248 ml  Output    650 ml  Net   1598 ml    Labs:   Lab 10/11/12 0435 10/09/12 1252 10/06/12 1017  NA 136 135 140  K 3.5 3.8 3.9  CL 102 97 103  CO2 27 23 27   BUN 8 10 10.0  CREATININE 0.69 0.79 0.8  CALCIUM 8.7 9.0 9.0  MG -- -- --  PHOS -- -- --  GLUCOSE 96 136* 99    CBG (last 3)  No results found for this basename: GLUCAP:3 in the last 72 hours  Scheduled Meds:   . albuterol  2.5 mg Nebulization Q6H  . azithromycin  500 mg Intravenous Q24H  . calcium-vitamin D  1 tablet Oral Daily  . cefTRIAXone (ROCEPHIN)  IV  1 g Intravenous Q24H  . enoxaparin (LOVENOX) injection  40 mg Subcutaneous Q24H  . fluticasone  1 puff Inhalation BID  . folic acid  400 mcg Oral Daily  . ipratropium  0.5 mg Nebulization QID  . polyethylene glycol  17 g Oral Daily  . predniSONE  5 mg Oral Daily  . simvastatin  5 mg Oral QHS    Continuous Infusions:   Past  Medical History  Diagnosis Date  . COPD (chronic obstructive pulmonary disease)   . Non-small cell lung cancer   . Neutropenia, drug-induced 05/05/2012  . Hyperlipidemia   . Rheumatoid arthritis   . Asthma   . Hiatal hernia     Past Surgical History  Procedure Date  . Appendex 1962  . Video bronchoscopy 01/28/2012    Procedure: VIDEO BRONCHOSCOPY WITHOUT FLUORO;  Surgeon: Kalman Shan, MD;  Location: [facility] ENDOSCOPY;  Service: Endoscopy;  Laterality: Bilateral;  . Surgery on right wrist     Oran Rein, RD, LDN Clinical Inpatient Dietitian Pager:  [phone] Weekend and after hours pager:  [phone]

## 2012-10-11 NOTE — Progress Notes (Addendum)
Patient ID: Destiny Harrison, female   DOB: Mar 09, 1941, 71 y.o.   MRN: [id number]  [facility] PROGRESS NOTE  Destiny Harrison [id number] DOB: 1941/01/29 DOA: 10/09/2012 PCP: [name], MD  Brief narrative:  Destiny Harrison is an 71 y.o. female with a PMH of non-small cell lung squamous cell carcinoma diagnosed in 12/2011, under the care of Dr. Arbutus Ped, status post palliative radiotherapy to left lung completed 03/06/12 and ongoing systemic chemotherapy who presents to the ER with fever, chills, N/V/D. The patient began to have problems with nausea, vomiting, and diarrhea approximately 24 hours prior to admission. She last vomited the morning of morning and her last episode of diarrhea was one night prior to admission. She's been running fevers. She's had some myalgias in her back and chest. The patient does report that she has had a pneumococcal and influenza vaccine. The patient denies upper respiratory symptoms at this time. Specifically, she denies dyspnea, cough, nasal congestion, and pharyngitis. Her last chemotherapy was on 09/22/2012. She was supposed to have a treatment on 12/10/213, but this was apparently canceled due to low blood counts.   Assessment/Plan  Principal Problem:  *Febrile illness, acute in a patient who has had recent chemotherapy  The patient is not currently neutropenic but we'll need to monitor blood counts closely. If her counts begin to fall, may need Neupogen.  Nasal swab for influenza PCR studies negative, discontinue precautions  Continue Rocephin and azithromycin for presumptive PNA in the setting of COPD exacerbation Supportive care for viral gastroenteritis. Active Problems:  COPD, moderate  Continue nebulized bronchodilator therapy 4 times a day, add as needed Lung cancer  We'll notify Dr. Arbutus Ped of the patient's admission. Anemia of chronic disease  drop inHg since yesterday and unclear etiology   will continue to monitor and transfuse if drops <  7.5 Viral gastroenteritis  Pt clinically improving this AM Tolerating current diet well We'll continue IV fluids, antinausea medication as needed. Hyperlipidemia  Continue statin. Community acquired pneumonia  Rocephin and azithromycin day #3 Blood and sputum cultures ordered. Strep pneumonia antigen and Legionella antigen ordered. All negative to date.  Consultants:  None Procedures/Studies:  Dg Chest Port 1 View  10/09/2012  COPD with questionable left upper lobe infiltrate.  Antibiotics:  Rocephin 12/20 -->  Zithro 12/20 -->  Code Status: Full  Family Communication: Pt at bedside  Disposition Plan: Plan d/c in AM   HPI/Subjective: No events overnight. Pt reports feeling better.   Objective: Filed Vitals:   10/10/12 1553 10/10/12 2030 10/10/12 2138 10/11/12 0511  BP: 112/41  130/59 125/61  Pulse: 60  100 87  Temp: 97.9 F (36.6 C)  98.1 F (36.7 C) 98.5 F (36.9 C)  TempSrc: Oral  Oral Oral  Resp: 18  18 18   Height:      Weight:      SpO2: 100% 98% 97% 100%    Intake/Output Summary (Last 24 hours) at 10/11/12 0724 Last data filed at 10/11/12 0600  Gross per 24 hour  Intake   2803 ml  Output   1460 ml  Net   1343 ml    Exam:   General:  Pt is alert, follows commands appropriately, not in acute distress  Cardiovascular: Regular rate and rhythm, S1/S2, no murmurs, no rubs, no gallops  Respiratory: Clear to auscultation bilaterally, no wheezing, no crackles, no rhonchi  Abdomen: Soft, non tender, slightly distended, bowel sounds present, no guarding  Extremities: No edema, pulses DP and PT palpable bilaterally  Neuro: Grossly  nonfocal  Data Reviewed: Basic Metabolic Panel:  Lab 10/11/12 1610 10/09/12 1252 10/06/12 1017  NA 136 135 140  K 3.5 3.8 3.9  CL 102 97 103  CO2 27 23 27   GLUCOSE 96 136* 99  BUN 8 10 10.0  CREATININE 0.69 0.79 0.8  CALCIUM 8.7 9.0 9.0  MG -- -- --  PHOS -- -- --   Liver Function Tests:  Lab 10/09/12 1252  10/06/12 1017  AST 20 21  ALT 12 15  ALKPHOS 75 94  BILITOT 0.4 0.39  PROT 8.2 7.5  ALBUMIN 3.6 3.4*   CBC:  Lab 10/11/12 0435 10/10/12 0514 10/09/12 1252 10/06/12 1017  WBC 4.2 4.8 4.3 2.9*  NEUTROABS -- -- 2.4 0.8*  HGB 7.8* 8.0* 9.7* 9.1*  HCT 23.1* 23.3* 28.2* 26.4*  MCV 85.2 85.7 85.2 88.9  PLT 265 244 295 168     Recent Results (from the past 240 hour(s))  CULTURE, BLOOD (ROUTINE X 2)     Status: Normal (Preliminary result)   Collection Time   10/09/12  1:08 PM      Component Value Range Status Comment   Specimen Description BLOOD LEFT ARM   Final    Special Requests BOTTLES DRAWN AEROBIC AND ANAEROBIC   Final    Culture  Setup Time 10/09/2012 20:22   Final    Culture     Final    Value:        BLOOD CULTURE RECEIVED NO GROWTH TO DATE CULTURE WILL BE HELD FOR 5 DAYS BEFORE ISSUING A FINAL NEGATIVE REPORT   Report Status PENDING   Incomplete   CULTURE, BLOOD (ROUTINE X 2)     Status: Normal (Preliminary result)   Collection Time   10/09/12  1:15 PM      Component Value Range Status Comment   Specimen Description BLOOD LEFT HAND   Final    Special Requests BOTTLES DRAWN AEROBIC AND ANAEROBIC 3.5CC   Final    Culture  Setup Time 10/09/2012 20:22   Final    Culture     Final    Value:        BLOOD CULTURE RECEIVED NO GROWTH TO DATE CULTURE WILL BE HELD FOR 5 DAYS BEFORE ISSUING A FINAL NEGATIVE REPORT   Report Status PENDING   Incomplete   URINE CULTURE     Status: Normal   Collection Time   10/09/12  1:34 PM      Component Value Range Status Comment   Specimen Description URINE, CATHETERIZED   Final    Special Requests Immunocompromised   Final    Culture  Setup Time 10/09/2012 19:05   Final    Colony Count NO GROWTH   Final    Culture NO GROWTH   Final    Report Status 10/10/2012 FINAL   Final   CULTURE, EXPECTORATED SPUTUM-ASSESSMENT     Status: Normal   Collection Time   10/09/12  4:58 PM      Component Value Range Status Comment   Specimen Description  SPUTUM   Final    Special Requests Immunocompromised   Final    Sputum evaluation     Final    Value: MICROSCOPIC FINDINGS SUGGEST THAT THIS SPECIMEN IS NOT REPRESENTATIVE OF LOWER RESPIRATORY SECRETIONS. PLEASE RECOLLECT.     CALLED TO Jan Fireman RN @ 1859 10/09/12 WELLS,D.   Report Status 10/09/2012 FINAL   Final   CULTURE, EXPECTORATED SPUTUM-ASSESSMENT     Status: Normal  Collection Time   10/10/12  4:45 AM      Component Value Range Status Comment   Specimen Description SPUTUM   Final    Special Requests Immunocompromised   Final    Sputum evaluation     Final    Value: MICROSCOPIC FINDINGS SUGGEST THAT THIS SPECIMEN IS NOT REPRESENTATIVE OF LOWER RESPIRATORY SECRETIONS. PLEASE RECOLLECT.     B SHAW AT 0455 ON 12.21.2013 BY NBROOKS   Report Status 10/10/2012 FINAL   Final      Scheduled Meds:   . albuterol  2.5 mg Nebulization QID  . azithromycin  500 mg Intravenous Q24H  . calcium-vitamin D  1 tablet Oral Daily  . cefTRIAXone  IV  1 g Intravenous Q24H  . enoxaparin  injection  40 mg Subcutaneous Q24H  . fluticasone  1 puff Inhalation BID  . folic acid  400 mcg Oral Daily  . ipratropium  0.5 mg Nebulization QID  . predniSONE  5 mg Oral Daily  . simvastatin  5 mg Oral QHS   Continuous Infusions:   . sodium chloride 75 mL/hr at 10/10/12 1328  . sodium chloride       Debbora Presto, MD  [facility] Pager [phone]  If 7PM-7AM, please contact night-coverage [email or web address] Password [email or web address] 10/11/2012, 7:24 AM   LOS: 2 days

## 2012-10-12 LAB — CBC
Hemoglobin: 7.7 g/dL — ABNORMAL LOW (ref 12.0–15.0)
MCHC: 33.5 g/dL (ref 30.0–36.0)
WBC: 3.5 10*3/uL — ABNORMAL LOW (ref 4.0–10.5)

## 2012-10-12 LAB — BASIC METABOLIC PANEL
Chloride: 104 mEq/L (ref 96–112)
GFR calc Af Amer: >90 mL/min (ref >90)
GFR calc non Af Amer: 84 mL/min — ABNORMAL LOW (ref >90)
Glucose, Bld: 89 mg/dL (ref 70–99)
Potassium: 3.8 mEq/L (ref 3.5–5.1)
Sodium: 140 mEq/L (ref 135–145)

## 2012-10-12 MED ORDER — OXYCODONE-ACETAMINOPHEN 5-325 MG PO TABS: 1.0000 | ORAL_TABLET | ORAL | Status: DC | PRN

## 2012-10-12 MED ORDER — HEPARIN SOD (PORK) LOCK FLUSH 100 UNIT/ML IV SOLN
500.0000 [IU] | INTRAVENOUS | Status: AC | PRN
  Administered 2012-10-12: 500 [IU]

## 2012-10-12 MED ORDER — AZITHROMYCIN 250 MG PO TABS: 250.0000 mg | ORAL_TABLET | Freq: Every day | ORAL | Status: DC

## 2012-10-12 NOTE — Discharge Summary (Signed)
Physician Discharge Summary  Destiny Harrison ZOX:[id number] DOB: Apr 16, 1941 DOA: 10/09/2012  PCP: [name], MD  Admit date: 10/09/2012 Discharge date: 10/12/2012  Recommendations for Outpatient Follow-up:  1. Pt will need to follow up with PCP in 2-3 weeks post discharge 2. Please obtain BMP to evaluate electrolytes and kidney function 3. Please also check CBC to evaluate Hg and Hct levels 4. Please note that pt was discharged on Zithromax for more days to complete post discharge given admission for COPD exacerbation 5. Pt is on chronic low dose Prednisone which she will continue to take as previously prescribed   Discharge Diagnoses: Acute respiratory failure secondary to acute COPD exacerbation, viral gastroenteritis  Principal Problem:  *Febrile illness, acute in a patient who has had recent chemotherapy Active Problems:  COPD, moderate  Lung cancer  Viral gastroenteritis  Hyperlipidemia  Community acquired pneumonia  Discharge Condition: Stable  Diet recommendation: Heart healthy diet discussed in details   Brief narrative:  Destiny Harrison is an 71 y.o. female with a PMH of non-small cell lung squamous cell carcinoma diagnosed in 12/2011, under the care of Dr. Arbutus Ped, status post palliative radiotherapy to left lung completed 03/06/12 and ongoing systemic chemotherapy who presents to the ER with fever, chills, N/V/D. The patient began to have problems with nausea, vomiting, and diarrhea approximately 24 hours prior to admission. She last vomited the morning of morning and her last episode of diarrhea was one night prior to admission. She's been running fevers. She's had some myalgias in her back and chest. The patient does report that she has had a pneumococcal and influenza vaccine. The patient denies upper respiratory symptoms at this time. Specifically, she denies dyspnea, cough, nasal congestion, and pharyngitis. Her last chemotherapy was on 09/22/2012. She was supposed to have a  treatment on 12/10/213, but this was apparently canceled due to low blood counts.   Assessment/Plan  Principal Problem:  *Febrile illness, acute in a patient who has had recent chemotherapy  The patient is not currently neutropenic but we'll need to monitor blood counts closely. If her counts begin to fall, may need Neupogen.  Nasal swab for influenza PCR studies negative, discontinue precautions  Continued Rocephin and azithromycin for presumptive PNA in the setting of COPD exacerbation  Supportive care for viral gastroenteritis also provided with antiemetic, antidiarrheals, as needed and pt has responded well Active Problems:  COPD, moderate  Continue nebulized bronchodilator therapy 4 times a day, and as needed - antibiotics Zithro and Rocephin started and were narrowed down to Zithro only as pt clinically improved and responded well to the therapy  Lung cancer  We'll notify Dr. Arbutus Ped of the patient's admission. Anemia of chronic disease  drop in Hg since yesterday and unclear etiology  Viral gastroenteritis  Pt clinically improving this AM  Tolerating current diet well  Hyperlipidemia  Continue statin. Community acquired pneumonia  Rocephin and azithromycin day #4 completed and will continue Zithro only for 5 more days post discharge   Blood and sputum cultures ordered. Strep pneumonia antigen and Legionella antigen ordered. All negative to date.  Consultants:  None Procedures/Studies:  Dg Chest Port 1 View  10/09/2012  COPD with questionable left upper lobe infiltrate.  Antibiotics:  Rocephin 12/20 --> 12/23 Zithro 12/20 --> continue for 5 more days post discharge    Discharge Exam: Filed Vitals:   10/12/12 0621  BP: 105/64  Pulse: 92  Temp: 98.1 F (36.7 C)  Resp: 20   Filed Vitals:   10/11/12 2056 10/12/12  0209 10/12/12 0621 10/12/12 0949  BP: 128/62  105/64   Pulse: 82  92   Temp: 98.7 F (37.1 C)  98.1 F (36.7 C)   TempSrc: Oral  Oral   Resp: 18  20    Height:      Weight:      SpO2: 98% 99% 100% 97%    General: Pt is alert, follows commands appropriately, not in acute distress Cardiovascular: Regular rate and rhythm, S1/S2 +, no murmurs, no rubs, no gallops Respiratory: Clear to auscultation bilaterally, no wheezing, no crackles, no rhonchi Abdominal: Soft, non tender, non distended, bowel sounds +, no guarding Extremities: no edema, no cyanosis, pulses palpable bilaterally DP and PT Neuro: Grossly nonfocal  Discharge Instructions  Discharge Orders    Future Appointments: Provider: Department: Dept Phone: Center:   10/13/2012 10:45 AM [facility] [phone] None   10/13/2012 11:15 AM Si Gaul, MD [facility] MEDICAL ONCOL[facility] [phone] None   10/13/2012 12:15 PM [facility] [place] [facility] [phone] None   10/20/2012 10:30 AM [facility] [phone] None   10/20/2012 11:30 AM [facility] [address] [place] [facility] [phone] None   10/22/2012 2:30 PM [facility] [place] [facility] [phone] None   11/03/2012 1:30 PM [facility] [phone] None   11/10/2012 1:45 PM Kalman Shan, MD [facility] [phone] None     Future Orders Please Complete By Expires   Diet - low sodium heart healthy      Increase activity slowly          Medication List     As of 10/12/2012 11:22 AM    TAKE these medications         albuterol 108 (90 BASE) MCG/ACT inhaler   Commonly known as: PROVENTIL HFA;VENTOLIN HFA   Inhale 2 puffs into the lungs every 3 (three) hours as needed. WHEEZING AND SHORTNESS OF BREATH      albuterol (2.5 MG/3ML) 0.083% nebulizer solution   Commonly known as: PROVENTIL   1 vial in neb four times daily with Ipratropium.  May add 1 vial every 3  hours as needed for wheezing/shortness of breath      azithromycin 250 MG tablet   Commonly known as: ZITHROMAX   Take 1 tablet (250 mg total) by mouth daily.      beclomethasone 80 MCG/ACT inhaler   Commonly known as: QVAR   Inhale 2 puffs into the lungs 2 (two) times daily.      calcium-vitamin D 500-200 MG-UNIT per tablet   Commonly known as: OSCAL WITH D   Take 1 tablet by mouth daily.      folic acid 400 MCG tablet   Commonly known as: FOLVITE   Take 400 mcg by mouth daily.      ipratropium 0.02 % nebulizer solution   Commonly known as: ATROVENT   1 vial in neb four times daily with Albuterol      oxyCODONE-acetaminophen 5-325 MG per tablet   Commonly known as: PERCOCET/ROXICET   Take 1-2 tablets by mouth every 4 (four) hours as needed.      predniSONE 5 MG tablet   Commonly known as: DELTASONE   Take 5 mg by mouth daily.      simvastatin 5 MG tablet   Commonly known as: ZOCOR   Take 5  mg by mouth at bedtime.           Follow-up Information    Follow up with Vernie Ammons B, MD. In 1 week.   Contact information:   [address] [place] [address] [phone]           The results of significant diagnostics from this hospitalization (including imaging, microbiology, ancillary and laboratory) are listed below for reference.     Microbiology: Recent Results (from the past 240 hour(s))  CULTURE, BLOOD (ROUTINE X 2)     Status: Normal (Preliminary result)   Collection Time   10/09/12  1:08 PM      Component Value Range Status Comment   Specimen Description BLOOD LEFT ARM   Final    Special Requests BOTTLES DRAWN AEROBIC AND ANAEROBIC   Final    Culture  Setup Time 10/09/2012 20:22   Final    Culture     Final    Value:        BLOOD CULTURE RECEIVED NO GROWTH TO DATE CULTURE WILL BE HELD FOR 5 DAYS BEFORE ISSUING A FINAL NEGATIVE REPORT   Report Status PENDING   Incomplete   CULTURE, BLOOD (ROUTINE X 2)     Status: Normal (Preliminary  result)   Collection Time   10/09/12  1:15 PM      Component Value Range Status Comment   Specimen Description BLOOD LEFT HAND   Final    Special Requests BOTTLES DRAWN AEROBIC AND ANAEROBIC 3.5CC   Final    Culture  Setup Time 10/09/2012 20:22   Final    Culture     Final    Value:        BLOOD CULTURE RECEIVED NO GROWTH TO DATE CULTURE WILL BE HELD FOR 5 DAYS BEFORE ISSUING A FINAL NEGATIVE REPORT   Report Status PENDING   Incomplete   URINE CULTURE     Status: Normal   Collection Time   10/09/12  1:34 PM      Component Value Range Status Comment   Specimen Description URINE, CATHETERIZED   Final    Special Requests Immunocompromised   Final    Culture  Setup Time 10/09/2012 19:05   Final    Colony Count NO GROWTH   Final    Culture NO GROWTH   Final    Report Status 10/10/2012 FINAL   Final   CULTURE, EXPECTORATED SPUTUM-ASSESSMENT     Status: Normal   Collection Time   10/09/12  4:58 PM      Component Value Range Status Comment   Specimen Description SPUTUM   Final    Special Requests Immunocompromised   Final    Sputum evaluation     Final    Value: MICROSCOPIC FINDINGS SUGGEST THAT THIS SPECIMEN IS NOT REPRESENTATIVE OF LOWER RESPIRATORY SECRETIONS. PLEASE RECOLLECT.     CALLED TO Jan Fireman RN @ 1859 10/09/12 WELLS,D.   Report Status 10/09/2012 FINAL   Final   CULTURE, EXPECTORATED SPUTUM-ASSESSMENT     Status: Normal   Collection Time   10/10/12  4:45 AM      Component Value Range Status Comment   Specimen Description SPUTUM   Final    Special Requests Immunocompromised   Final    Sputum evaluation     Final    Value: MICROSCOPIC FINDINGS SUGGEST THAT THIS SPECIMEN IS NOT REPRESENTATIVE OF LOWER RESPIRATORY SECRETIONS. PLEASE RECOLLECT.     B SHAW AT 0455 ON 12.21.2013 BY NBROOKS  Report Status 10/10/2012 FINAL   Final      Labs: Basic Metabolic Panel:  Lab 10/12/12 1610 10/11/12 0435 10/09/12 1252 10/06/12 1017  NA 140 136 135 140  K 3.8 3.5 3.8 3.9  CL 104  102 97 103  CO2 29 27 23 27   GLUCOSE 89 96 136* 99  BUN 6 8 10  10.0  CREATININE 0.71 0.69 0.79 0.8  CALCIUM 8.9 8.7 9.0 9.0  MG -- -- -- --  PHOS -- -- -- --   Liver Function Tests:  Lab 10/09/12 1252 10/06/12 1017  AST 20 21  ALT 12 15  ALKPHOS 75 94  BILITOT 0.4 0.39  PROT 8.2 7.5  ALBUMIN 3.6 3.4*   CBC:  Lab 10/12/12 0520 10/11/12 0435 10/10/12 0514 10/09/12 1252 10/06/12 1017  WBC 3.5* 4.2 4.8 4.3 2.9*  NEUTROABS -- -- -- 2.4 0.8*  HGB 7.7* 7.8* 8.0* 9.7* 9.1*  HCT 23.0* 23.1* 23.3* 28.2* 26.4*  MCV 85.2 85.2 85.7 85.2 88.9  PLT 286 265 244 295 168   SIGNED: Time coordinating discharge: Over 30 minutes  Debbora Presto, MD  [facility] 10/12/2012, 11:22 AM Pager [phone]  If 7PM-7AM, please contact night-coverage [email or web address] Password [email or web address]

## 2012-10-13 ENCOUNTER — Ambulatory Visit: Payer: Medicare Other

## 2012-10-13 ENCOUNTER — Ambulatory Visit (HOSPITAL_BASED_OUTPATIENT_CLINIC_OR_DEPARTMENT_OTHER): Payer: Medicare Other | Admitting: Internal Medicine

## 2012-10-13 ENCOUNTER — Other Ambulatory Visit (HOSPITAL_BASED_OUTPATIENT_CLINIC_OR_DEPARTMENT_OTHER): Payer: Medicare Other | Admitting: Lab

## 2012-10-13 ENCOUNTER — Ambulatory Visit: Payer: Medicare Other | Admitting: Internal Medicine

## 2012-10-13 ENCOUNTER — Encounter: Payer: Self-pay | Admitting: Internal Medicine

## 2012-10-13 ENCOUNTER — Other Ambulatory Visit: Payer: Medicare Other | Admitting: Lab

## 2012-10-13 ENCOUNTER — Telehealth: Payer: Self-pay | Admitting: Internal Medicine

## 2012-10-13 VITALS — BP 139/71 | HR 139 | Temp 98.8°F | Resp 20

## 2012-10-13 DIAGNOSIS — C3490 Malignant neoplasm of unspecified part of unspecified bronchus or lung: Secondary | ICD-10-CM

## 2012-10-13 DIAGNOSIS — C3410 Malignant neoplasm of upper lobe, unspecified bronchus or lung: Secondary | ICD-10-CM

## 2012-10-13 DIAGNOSIS — D702 Other drug-induced agranulocytosis: Secondary | ICD-10-CM

## 2012-10-13 LAB — CBC WITH DIFFERENTIAL/PLATELET
Basophils Absolute: 0 10*3/uL (ref 0.0–0.1)
Eosinophils Absolute: 0 10*3/uL (ref 0.0–0.5)
HCT: 25.9 % — ABNORMAL LOW (ref 34.8–46.6)
HGB: 8.6 g/dL — ABNORMAL LOW (ref 11.6–15.9)
MCV: 84.9 fL (ref 79.5–101.0)
NEUT#: 2.4 10*3/uL (ref 1.5–6.5)
RDW: 19.2 % — ABNORMAL HIGH (ref 11.2–14.5)
lymph#: 1.4 10*3/uL (ref 0.9–3.3)

## 2012-10-13 NOTE — Patient Instructions (Signed)
The scan showed stable disease except for mild increase in the right paratracheal lymph node. We will hold chemotherapy for now and continue on observation for the next three months.

## 2012-10-13 NOTE — Progress Notes (Signed)
[facility] [phone]   [phone]  OFFICE PROGRESS NOTE  Destiny Carrow, MD [address] [place] [address]  DIAGNOSIS: Metastatic non-small cell lung cancer, squamous cell carcinoma diagnosed in March of 2013.   PRIOR THERAPY:  1. Status post palliative radiotherapy to the left lung mass under the care of Dr. Michell Heinrich completed on 03/06/2012.  2. Systemic chemotherapy with carboplatin for AUC of 6 on day 1 and Abraxane 100 mg/M2 on days 1, 8 and 15 every 3 weeks. Status post 2 cycles  From cycle 3 forward AUC will be decreased to 4.5 given on day 1 and the Abraxane will be decreased to 90 mg per meter squared on days 1, 8 and 15 every 3 weeks, Status post a total of 3 cycles.   CURRENT THERAPY: Systemic chemotherapy with carboplatin for AUC of 5 on day 1 and gemcitabine 1000 mg/m2 given on day 1 and day 8 every 3 weeks,status post 1 cycle. Due to significant neutropenia she will be dosed reduced beginning cycle 2 forward to carboplatin at an AUC of 4 given on day 1 and gemcitabine at 800 mg per meter squared given on days 1 and 8 every 3 weeks. Status post 6 cycles.   INTERVAL HISTORY: Destiny Harrison 71 y.o. female returns to the clinic today for followup visit accompanied by her daughter-in-law. The patient is feeling fine today with no specific complaints. She tolerated the last cycle of her systemic chemotherapy with carboplatin and gemcitabine fairly well except for the persistent neutropenia after chemotherapy. The patient had that restaging scan of the chest, abdomen and pelvis performed recently and this was complicated with fever, nausea, vomiting and diarrhea. She was admitted to [facility] for evaluation and was treated empirically with a course of antibiotic. She was discharged home yesterday and feeling much better. The patient is here today for evaluation and discussion of her treatment options based on the scan results. She  has no significant chest pain, shortness breath, cough or hemoptysis. She denied having any significant weight loss or night sweats.  MEDICAL HISTORY: Past Medical History  Diagnosis Date  . COPD (chronic obstructive pulmonary disease)   . Non-small cell lung cancer   . Neutropenia, drug-induced 05/05/2012  . Hyperlipidemia   . Rheumatoid arthritis   . Asthma   . Hiatal hernia     ALLERGIES:   has no known allergies.  MEDICATIONS:  Current Outpatient Prescriptions  Medication Sig Dispense Refill  . albuterol (PROVENTIL HFA;VENTOLIN HFA) 108 (90 BASE) MCG/ACT inhaler Inhale 2 puffs into the lungs every 3 (three) hours as needed. WHEEZING AND SHORTNESS OF BREATH      . albuterol (PROVENTIL) (2.5 MG/3ML) 0.083% nebulizer solution 1 vial in neb four times daily with Ipratropium.  May add 1 vial every 3 hours as needed for wheezing/shortness of breath      . azithromycin (ZITHROMAX) 250 MG tablet Take 1 tablet (250 mg total) by mouth daily.  5 each  0  . beclomethasone (QVAR) 80 MCG/ACT inhaler Inhale 2 puffs into the lungs 2 (two) times daily.      . calcium-vitamin D (OSCAL WITH D) 500-200 MG-UNIT per tablet Take 1 tablet by mouth daily.      . folic acid (FOLVITE) 400 MCG tablet Take 400 mcg by mouth daily.      Marland Kitchen ipratropium (ATROVENT) 0.02 % nebulizer solution 1 vial in neb four times daily with Albuterol      .  oxyCODONE-acetaminophen (PERCOCET/ROXICET) 5-325 MG per tablet Take 1-2 tablets by mouth every 4 (four) hours as needed.  45 tablet  0  . predniSONE (DELTASONE) 5 MG tablet Take 5 mg by mouth daily.        . simvastatin (ZOCOR) 5 MG tablet Take 5 mg by mouth at bedtime.        SURGICAL HISTORY:  Past Surgical History  Procedure Date  . Appendex 1962  . Video bronchoscopy 01/28/2012    Procedure: VIDEO BRONCHOSCOPY WITHOUT FLUORO;  Surgeon: Kalman Shan, MD;  Location: [facility] ENDOSCOPY;  Service: Endoscopy;  Laterality: Bilateral;  . Surgery on right wrist     REVIEW OF  SYSTEMS:  A comprehensive review of systems was negative.   PHYSICAL EXAMINATION: General appearance: alert, cooperative and no distress Head: Normocephalic, without obvious abnormality, atraumatic Neck: no adenopathy Lymph nodes: Cervical, supraclavicular, and axillary nodes normal. Resp: clear to auscultation bilaterally Cardio: regular rate and rhythm, S1, S2 normal, no murmur, click, rub or gallop GI: soft, non-tender; bowel sounds normal; no masses,  no organomegaly Extremities: extremities normal, atraumatic, no cyanosis or edema Neurologic: Alert and oriented X 3, normal strength and tone. Normal symmetric reflexes. Normal coordination and gait  ECOG PERFORMANCE STATUS: 1 - Symptomatic but completely ambulatory  Blood pressure 139/71, pulse 139, temperature 98.8 F (37.1 C), temperature source Oral, resp. rate 20.  LABORATORY DATA: Lab Results  Component Value Date   WBC 4.9 10/13/2012   HGB 8.6* 10/13/2012   HCT 25.9* 10/13/2012   MCV 84.9 10/13/2012   PLT 331 10/13/2012      Chemistry      Component Value Date/Time   NA 140 10/12/2012 0520   NA 140 10/06/2012 1017   K 3.8 10/12/2012 0520   K 3.9 10/06/2012 1017   CL 104 10/12/2012 0520   CL 103 10/06/2012 1017   CO2 29 10/12/2012 0520   CO2 27 10/06/2012 1017   BUN 6 10/12/2012 0520   BUN 10.0 10/06/2012 1017   CREATININE 0.71 10/12/2012 0520   CREATININE 0.8 10/06/2012 1017      Component Value Date/Time   CALCIUM 8.9 10/12/2012 0520   CALCIUM 9.0 10/06/2012 1017   ALKPHOS 75 10/09/2012 1252   ALKPHOS 94 10/06/2012 1017   AST 20 10/09/2012 1252   AST 21 10/06/2012 1017   ALT 12 10/09/2012 1252   ALT 15 10/06/2012 1017   BILITOT 0.4 10/09/2012 1252   BILITOT 0.39 10/06/2012 1017       RADIOGRAPHIC STUDIES: Ct Chest W Contrast  10/08/2012  *RADIOLOGY REPORT*  Clinical Data:  Metastatic lung cancer  CT CHEST, ABDOMEN AND PELVIS WITH CONTRAST  Technique:  Multidetector CT imaging of the chest,  abdomen and pelvis was performed following the standard protocol during bolus administration of intravenous contrast.  Contrast: OMNIPAQUE IOHEXOL 300 MG/ML  SOLN  Comparison:  07/30/2012   CT CHEST  Findings:  Lungs/pleura: Moderate changes of centrilobular emphysema identified. Left apical scarring is identified.  Stable small nodule in the posterior right upper lobe measuring 4 mm, image number 14.  No new or enlarging pulmonary nodules or masses.  Heart/Mediastinum: Normal heart size.  No pericardial effusion. Multiple calcified mediastinal lymph nodes identified. Noncalcified right paratracheal lymph node measures 2.7 x 2.2 cm, image 16.  Previously 2.4 x 1.7 cm.  Bones/Musculoskeletal:  Right supraclavicular lymph node measures 1.3 cm, image 10.  Previously 1.6 cm.  Low attenuation nodules in the right lobe of thyroid gland appears similar to  previous exam. No aggressive lytic or sclerotic bone lesions identified.  IMPRESSION:  1. No acute findings.  2.  Metastatic lymph node within the right paratracheal region is slightly increased in size from previous exam. 3.  Decrease in size of right supraclavicular lymph node.   CT ABDOMEN AND PELVIS  Findings:  Hyperattenuating structure within the anterior right hepatic lobe, image 59, is stable from previous exam and likely represents a vascular anomaly.  The spleen is normal.  The gallbladder is normal.  No biliary dilatation.  Normal appearance of the pancreas.  The adrenal glands are both normal.  Normal appearance of the kidneys.  Urinary bladder appears normal for degree of distention. Uterus and adnexal structures are unremarkable.  No enlarged upper abdominal lymph nodes.  There is no pelvic or inguinal adenopathy.  The stomach and the small bowel loops appear normal.  Normal appearance of the colon.  Pagetoid changes involving the proximal right femur noted.  This is unchanged from previous exam.  Multilevel spondylosis noted within the lower lumbar  spine.  IMPRESSION:  1.  No evidence for soft tissue metastasis to the abdomen or pelvis. 2.  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   Original Report Authenticated By: Signa Kell, M.D.    Ct Femur Right W Contrast  10/08/2012  *RADIOLOGY REPORT*  Clinical Data: Mid right femoral mass. Lung cancer.  CT OF THE RIGHT LOWER EXTREMITY WITH CONTRAST  Technique:  Contiguous axial images were obtained through the right femur after IV contrast administration.  Coronal and sagittal reconstructions were created.  Contrast: OMNIPAQUE IOHEXOL 300 MG/ML  SOLN  Comparison: 07/30/2012.  12/28/2009 bone scan.  Findings: Paget's disease of the right femur is present, more prominent proximally and distally but affecting the head, neck, proximal metaphysis and diaphysis.  There is no pathologic fracture.  No evidence of sarcomatous transformation.  Osteopenia is present.  There is no mass in the anterior right thigh.  There is a cutaneous marker over the anterior quadriceps however there is no mass lesion identified in this region.  Muscular architecture appears within normal limits.  Neurovascular bundles appear normal. Posterior muscular compartment and adductor muscular compartments appear normal. Speckled areas of sclerosis in the distal femoral metaphysis likely represent either calcified enchondroma or bone infarct.  IMPRESSION: No bony or soft tissue mass in the right thigh.  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   Original Report Authenticated By: Andreas Newport, M.D.    Dg Chest Port 1 View  10/09/2012  *RADIOLOGY REPORT*  Clinical Data: Shortness of breath, chest pain, stage IV lung cancer on chemotherapy, COPD  PORTABLE CHEST - 1 VIEW  Comparison: Portable exam 1403 hrs correlated with prior CT chest of 10/08/2012  Findings: Right jugular Port-A-Cath tip projecting over SVC. Rotated to the right. Normal heart size, mediastinal contours, pulmonary  vascularity. Changes of COPD with questionable left upper lobe infiltrate. No pleural effusion or pneumothorax. Bones demineralized. Retained contrast in the splenic flexure of the colon.  IMPRESSION: COPD with questionable left upper lobe infiltrate.   Original Report Authenticated By: Ulyses Southward, M.D.     ASSESSMENT: This is a very pleasant 71 years old African American female with a history of metastatic non-small cell lung cancer, squamous cell carcinoma most recently treated with 6 cycles of systemic chemotherapy with reduced dose carboplatin and gemcitabine. Her treatment has always been complicated with significant neutropenia and the patient missed several doses of his chemotherapy because  of the neutropenia. CT scan of the chest, abdomen and pelvis in general showed stable disease except for a slight enlargement of the right paratracheal lymph node.   PLAN: I discussed the scan results with the patient and her daughter-in-law today. I recommended for her to take a break from chemotherapy because of the significant neutropenia. I would see her back for followup visit in 3 months with repeat CT scan of the chest, abdomen and pelvis without contrast for restaging of her disease. If the patient has any evidence for disease progression on the upcoming scan I may consider her for treatment with single agent chemotherapy or oral Tarceva. The patient agreed to the current plan.  All questions were answered. The patient knows to call the clinic with any problems, questions or concerns. We can certainly see the patient much sooner if necessary.  I spent 15 minutes counseling the patient face to face. The total time spent in the appointment was 25 minutes.

## 2012-10-13 NOTE — Telephone Encounter (Signed)
gv and printed appt schedule for pt for March 2014....gv pt Barium..the patient aware that central scheduling will contact with d/t of ct.

## 2012-10-15 LAB — CULTURE, BLOOD (ROUTINE X 2)

## 2012-10-20 ENCOUNTER — Other Ambulatory Visit: Payer: Medicare Other | Admitting: Lab

## 2012-10-20 ENCOUNTER — Ambulatory Visit: Payer: Medicare Other

## 2012-10-21 ENCOUNTER — Ambulatory Visit: Payer: Medicare Other

## 2012-10-22 ENCOUNTER — Ambulatory Visit: Payer: Medicare Other

## 2012-10-27 ENCOUNTER — Other Ambulatory Visit: Payer: Medicare Other | Admitting: Lab

## 2012-11-03 ENCOUNTER — Other Ambulatory Visit: Payer: Medicare Other | Admitting: Lab

## 2012-11-10 ENCOUNTER — Ambulatory Visit (INDEPENDENT_AMBULATORY_CARE_PROVIDER_SITE_OTHER): Payer: Medicare Other | Admitting: Internal Medicine

## 2012-11-10 ENCOUNTER — Encounter: Payer: Self-pay | Admitting: Internal Medicine

## 2012-11-10 VITALS — BP 110/70 | HR 115 | Temp 99.6°F | Ht 65.0 in | Wt 140.0 lb

## 2012-11-10 DIAGNOSIS — J449 Chronic obstructive pulmonary disease, unspecified: Secondary | ICD-10-CM | POA: Diagnosis not present

## 2012-11-10 DIAGNOSIS — J4489 Other specified chronic obstructive pulmonary disease: Secondary | ICD-10-CM

## 2012-11-10 NOTE — Patient Instructions (Addendum)
#  COPD  continue medications and follow Tammy Parrett  med calendar  Any problems come sooner Continue regular treadmill at home. In addition do some light kettlebell exercises at home (2-5#), do 15 - 25 reps x 3 sets each time x 3 per week  - the specific exercise is called kettlebell swing. You can look this up in youtube. Be careful and do not overdo  - this is good for your core strength  #Followup  REturn to see me in 6  Months or sooner if needed

## 2012-11-10 NOTE — Progress Notes (Deleted)
  Subjective:    Patient ID: Destiny Harrison, female    DOB: February 08, 1941, 72 y.o.   MRN: [id number]  HPI    Review of Systems  Constitutional: Negative for fever and unexpected weight change.  HENT: Negative for ear pain, nosebleeds, congestion, sore throat, rhinorrhea, sneezing, trouble swallowing, dental problem, postnasal drip and sinus pressure.   Eyes: Negative for redness and itching.  Respiratory: Positive for cough and wheezing. Negative for chest tightness and shortness of breath.   Cardiovascular: Negative for palpitations and leg swelling.  Gastrointestinal: Negative for nausea and vomiting.  Genitourinary: Negative for dysuria.  Musculoskeletal: Negative for joint swelling.  Skin: Negative for rash.  Neurological: Negative for headaches.  Hematological: Does not bruise/bleed easily.  Psychiatric/Behavioral: Negative for dysphoric mood. The patient is not nervous/anxious.        Objective:   Physical Exam        Assessment & Plan:

## 2012-11-10 NOTE — Progress Notes (Signed)
Patient ID: Destiny Harrison, female   DOB: 1941-05-22, 72 y.o.   MRN: [id number] #Ex-smoker: quit smoking about 2 years ago but had 20 pack smokng hx.    # RA - from age 8. Dr Dierdre Forth. Prednisone and Methotrexate  #COPD - moderate   -  PFTs - Gold stage 2 copd with asthma component. FEv1 1L/54%., 14% BD response on FVC, ratio 54, DLCO 42%  #NSCLC - diagnosed and  metastatic 2013 with rib lesion   - CT Chest 02/20/12 Left upper lobe endobronchial mass with hypermetabolic adenopathy extending to the contralateral mediastinum and deep to the right clavicular head, all consistent with primary bronchogenic carcinoma. Left eleventh rib lesion is highly worrisome for a metastatic lesion.    - status post palliative radiotherapy to the left lung mass and currently (as of 02/20/12)  undergoing systemic chemotherapy with carboplatin and Abraxane status post 1 dose.   based on bronchoscopy  1. - Status post palliative radiotherapy to the left lung mass under the care of Dr. Michell Heinrich completed on 03/06/2012.  2. Systemic chemotherapy with carboplatin for AUC of 6 on day 1 and Abraxane 100 mg/M2 on days 1, 8 and 15 every 3 weeks. Status post 2 cycles  From cycle 3 forward AUC will be decreased to 4.5 given on day 1 and the Abraxane will be decreased to 90 mg per meter squared on days 1, 8 and 15 every 3 weeks, Status post a total of 3 cycles  CURRENT THERAPY: Systemic chemotherapy with carboplatin for AUC of 5 on day 1 and gemcitabine 1000 mg/m2 given on day 1 and day 8 every 3 weeks,status post 1 cycle. Due to significant neutropenia she will be dosed reduced beginning cycle 2 forward to carboplatin at an AUC of 4 given on day 1 and gemcitabine at 800 mg per meter squared given on days 1 and 8 every 3 weeks. Status post 2 cycles  The patient has mixed response to this treatment with decrease in the size of the left perihilar lesion but progression of the mediastinal nodal metastasis. She is now being treated with  systemic chemotherapy in the form of carboplatin for an AUC of 5 given on day 1 and gemcitabine 1000 mg read square given on days 1 and 8 every 3 weeks status post 1 cycle. She did however continue to have some neutropenia even with this chemotherapy regimen and therefore will be dose reduced to carboplatin for an AUC of 4 given on day 1 and gemcitabine at 800 mg read square given on days 1 and 8 every 3 weeks from cycle to forward. Status post a total of 2 cycles of chemotherapy with this regimen. Patient was discussed with Dr. Arbutus Ped. Her ANC is subtherapeutic, at 0.8, to proceed with cycle #3 as scheduled today. She will again be treated with Neupogen at 300 mcg subcutaneously for 3 days to address her neutropenia. Neutropenic precautions were discussed and the patient and her son both voiced understanding. We'll reschedule the start of cycle 3 to begin in one week pending adequate counts. She will followup with Dr. Arbutus Ped in 4 weeks prior to her next scheduled cycle of chemotherapy with a repeat CBC differential C. met, and CT the chest abdomen and pelvis with contrast to reevaluate her disease. To address her cough, she was given a prescription for Hycodan cough syrup.     OV 03/24/2012  Followup COPD moderate disease. IN interim since last visit diagnosed with NSCLC with mets to rib. Undergoing chemo  but last cycle and today's cycle witheld due to low wbc. She is upset that her cancer Rx is getting delayed. In terms of copd, feeling baseline. In fact, dyspnea improved. She had some worsening cough and some hemoptysis last week but this is under control with hycodan. SHe denies fever, cold, sputum, edema, wheeze or even fatigue different from baseline.  She is gaining weight of note with cancer rX. HEr main other concern is cost of Rx and psychosocial stress  THough stable CAT copd score is 31 and reflects high symptom burden     Past, Family, Social reviewed:as above + lot of $ stress and  emotional stres due to illness and dealing with son who has ESRD and is on transplant list   #COPD  - you have copd that is moderate and is stable  - stop spiriva and symbicort  - instead start albuterol nebulizer 4 times daily + atrovent nebulizer 4 times daily scheduled  - use qvar inhaler 2 puff twice daily scheduled - take some samples  - generic ventolin inhaler as needed  - - If the above is not resulting in lower costs, let me know asap  - will discuss rehab at followup  #Lung Cancer  - per Dr Arbutus Ped  #followup  In oct 2013 with CAT score at followup  please call us or let us know anytime if you are not feeling well   OV 07/23/2012  COPD : doing well. Needs flu shot today 07/23/2012. CAT score is 29 and slightly better to no change from baseline. ECOG is 0 to 1. Uses nebs regularly. No change in dyspnea or baseline cough. Never been to rehab but is willing. Confused about meds - doing atrovent tid and qvar prn     Past, Family, Social reviewed: cancer progressive per onc notes in mediastinum and problems with neutropenia and dose reductions and chemo hold. Repeat scan due in few days to weeks. Dr Arbutus Ped folowing    REC #COPD  continue medications but you are not doing them right - see Tammy Parrett for med calendar  have flu shot today 07/23/2012 Any problems come sooner Referring to pulmonary rehab  #Followup NP Tammy for med calendar and education REturn to see me in 3  months   OV with NP 08/06/12  REc May add Delsym 2 tsp Twice daily  As needed  Cough  Follow med calendar closely and bring to each visit follow up Dr. Marchelle Gearing in 3 months and As needed      OV 11/10/2012  Followup for COPD  COPD - doing well. Overall COPD and ESAS shows huge improviement; see below. In terms of her cancer she has undergone chemotherapy and by her history she is on observation she is due for a CT scan in March 2014. Past, Family, Social reviewed: no change since  last visit   CAT COPD Symptom and Quality of Life Score ([employer] trademark)  June 2013 07/23/2012  11/10/2012   Never Cough -> Cough all the time 4 5 2   No phlegm in chest -> Chest is full of phlegm 4 0 0  No chest tightness -> Chest feels very tight 4 4 2   No dyspnea for 1 flight stairs/hill -> Very dyspneic for 1 flight of stairs 4 5 0  No limitations for ADL at home -> Very limited with ADL at home 3 5 0  Confident leaving home -> Not at all confident leaving home 3 2 0  Sleep soundly -> Do not sleep soundly because of lung condition 5 5 0  Lots of Energy -> No energy at all 4 3 1   TOTAL Score (max 40)  31 29 5     [place] Symp Assessment Scale ESAS. 0 to 10 07/23/2012  11/10/2012   No Pain -> Worst pain 8 2 to 8 for chronic arthralgia  No Tiredness -> Worst tiredness 6 3  No Nausea -> Worst nausea 0 0  No Depression - Worst depression 0 0  No Anxiety -> Worst anxiety 0 0  No Drowsiness -> Worst drowsiness 0 0  Best -> Worst appetite 5 0  Best wellbeing -> worst wellbeing 2 1  No dyspnea -> Worst dyspnea 8 2  Other problem 0 2.5, cough     Review of Systems  Constitutional: Negative for fever and unexpected weight change.  HENT: Negative for ear pain, nosebleeds, congestion, sore throat, rhinorrhea, sneezing, trouble swallowing, dental problem, postnasal drip and sinus pressure.   Eyes: Negative for redness and itching.  Respiratory: Positive for cough and wheezing. Negative for chest tightness and shortness of breath.   Cardiovascular: Negative for palpitations and leg swelling.  Gastrointestinal: Negative for nausea and vomiting.  Genitourinary: Negative for dysuria.  Musculoskeletal: Negative for joint swelling.  Skin: Negative for rash.  Neurological: Negative for headaches.  Hematological: Does not bruise/bleed easily.  Psychiatric/Behavioral: Negative for dysphoric mood. The patient is not nervous/anxious.

## 2012-11-10 NOTE — Progress Notes (Deleted)
Patient ID: Destiny Harrison, female   DOB: [birth year], 72 y.o.   MRN: [id number]

## 2012-11-11 NOTE — Assessment & Plan Note (Signed)
#  COPD Stable disease as of this visit 11/10/2012 with significant improvement in CAT score and admitting symptom assessment scale. Improvement largely due to 2 completing chemotherapy and physical exercise at home Plan  continue medications and follow Tammy Parrett  med calendar  Any problems come sooner Continue regular treadmill at home. In addition do some light kettlebell exercises at home (2-5#), do 15 - 25 reps x 3 sets each time x 3 per week  - the specific exercise is called kettlebell swing. You can look this up in youtube. Be careful and do not overdo  - this is good for your core strength  #Followup  REturn to see me in 6  Months or sooner if needed  (> 50% of this 15 min visit spent in face to face counseling)

## 2012-12-18 ENCOUNTER — Telehealth: Payer: Self-pay | Admitting: *Deleted

## 2012-12-18 NOTE — Telephone Encounter (Signed)
Pt called c/o of a new numbness and tingling in her hands and feet.  She states she also has a fever of 99.0.  Asked pt to all PCP as she is on observation.  SLJ

## 2012-12-22 ENCOUNTER — Other Ambulatory Visit: Payer: Self-pay | Admitting: Medical Oncology

## 2012-12-22 MED ORDER — HYDROCODONE-ACETAMINOPHEN 5-500 MG PO TABS: 1.0000 | ORAL_TABLET | ORAL | Status: DC | PRN

## 2012-12-22 NOTE — Telephone Encounter (Signed)
Hydrocodone refills sent to [facility] for authorization and called to pharmacy -pt notified

## 2012-12-24 DIAGNOSIS — E785 Hyperlipidemia, unspecified: Secondary | ICD-10-CM | POA: Diagnosis not present

## 2012-12-24 DIAGNOSIS — G609 Hereditary and idiopathic neuropathy, unspecified: Secondary | ICD-10-CM | POA: Diagnosis not present

## 2012-12-24 DIAGNOSIS — M899 Disorder of bone, unspecified: Secondary | ICD-10-CM | POA: Diagnosis not present

## 2012-12-24 DIAGNOSIS — M949 Disorder of cartilage, unspecified: Secondary | ICD-10-CM | POA: Diagnosis not present

## 2013-01-04 ENCOUNTER — Encounter: Payer: Self-pay | Admitting: *Deleted

## 2013-01-12 ENCOUNTER — Other Ambulatory Visit (HOSPITAL_BASED_OUTPATIENT_CLINIC_OR_DEPARTMENT_OTHER): Payer: Medicare Other | Admitting: Lab

## 2013-01-12 ENCOUNTER — Ambulatory Visit (HOSPITAL_COMMUNITY)
Admission: RE | Admit: 2013-01-12 | Discharge: 2013-01-12 | Disposition: A | Discharge status: Home / Self Care | Payer: Medicare Other | Source: Ambulatory Visit | Attending: Internal Medicine | Admitting: Internal Medicine

## 2013-01-12 ENCOUNTER — Encounter (HOSPITAL_COMMUNITY): Payer: Self-pay

## 2013-01-12 DIAGNOSIS — C3490 Malignant neoplasm of unspecified part of unspecified bronchus or lung: Secondary | ICD-10-CM | POA: Insufficient documentation

## 2013-01-12 DIAGNOSIS — J479 Bronchiectasis, uncomplicated: Secondary | ICD-10-CM | POA: Diagnosis not present

## 2013-01-12 DIAGNOSIS — M889 Osteitis deformans of unspecified bone: Secondary | ICD-10-CM | POA: Insufficient documentation

## 2013-01-12 DIAGNOSIS — C7951 Secondary malignant neoplasm of bone: Secondary | ICD-10-CM | POA: Insufficient documentation

## 2013-01-12 DIAGNOSIS — R599 Enlarged lymph nodes, unspecified: Secondary | ICD-10-CM | POA: Insufficient documentation

## 2013-01-12 DIAGNOSIS — R935 Abnormal findings on diagnostic imaging of other abdominal regions, including retroperitoneum: Secondary | ICD-10-CM | POA: Diagnosis not present

## 2013-01-12 DIAGNOSIS — Z923 Personal history of irradiation: Secondary | ICD-10-CM | POA: Insufficient documentation

## 2013-01-12 LAB — COMPREHENSIVE METABOLIC PANEL (CC13)
Albumin: 3.4 g/dL — ABNORMAL LOW (ref 3.5–5.0)
Alkaline Phosphatase: 75 U/L (ref 40–150)
BUN: 10.4 mg/dL (ref 7.0–26.0)
Glucose: 95 mg/dl (ref 70–99)
Total Bilirubin: 0.44 mg/dL (ref 0.20–1.20)

## 2013-01-12 LAB — CBC WITH DIFFERENTIAL/PLATELET
Basophils Absolute: 0 10*3/uL (ref 0.0–0.1)
Eosinophils Absolute: 0.2 10*3/uL (ref 0.0–0.5)
HGB: 11.4 g/dL — ABNORMAL LOW (ref 11.6–15.9)
LYMPH%: 45.1 % (ref 14.0–49.7)
MCV: 78.9 fL — ABNORMAL LOW (ref 79.5–101.0)
MONO%: 16.3 % — ABNORMAL HIGH (ref 0.0–14.0)
NEUT#: 1.4 10*3/uL — ABNORMAL LOW (ref 1.5–6.5)
NEUT%: 32.5 % — ABNORMAL LOW (ref 38.4–76.8)
Platelets: 248 10*3/uL (ref 145–400)

## 2013-01-14 ENCOUNTER — Telehealth: Payer: Self-pay | Admitting: Internal Medicine

## 2013-01-14 ENCOUNTER — Encounter: Payer: Self-pay | Admitting: Internal Medicine

## 2013-01-14 ENCOUNTER — Ambulatory Visit (HOSPITAL_BASED_OUTPATIENT_CLINIC_OR_DEPARTMENT_OTHER): Payer: Medicare Other | Admitting: Internal Medicine

## 2013-01-14 DIAGNOSIS — C3490 Malignant neoplasm of unspecified part of unspecified bronchus or lung: Secondary | ICD-10-CM | POA: Diagnosis not present

## 2013-01-14 NOTE — Progress Notes (Signed)
[facility] [phone]   Fax:([phone]  OFFICE PROGRESS NOTE  Destiny Carrow, MD No address on file  DIAGNOSIS: Metastatic non-small cell lung cancer, squamous cell carcinoma diagnosed in March of 2013.   PRIOR THERAPY:  1. Status post palliative radiotherapy to the left lung mass under the care of Dr. Michell Heinrich completed on 03/06/2012.  2. Systemic chemotherapy with carboplatin for AUC of 6 on day 1 and Abraxane 100 mg/M2 on days 1, 8 and 15 every 3 weeks. Status post 2 cycles. From cycle 3 forward AUC will be decreased to 4.5 given on day 1 and the Abraxane will be decreased to 90 mg per meter squared on days 1, 8 and 15 every 3 weeks, Status post a total of 3 cycles.  3. Systemic chemotherapy with carboplatin for AUC of 5 on day 1 and gemcitabine 1000 mg/m2 given on day 1 and day 8 every 3 weeks,status post 1 cycle. Due to significant neutropenia she will be dosed reduced beginning cycle 2 forward to carboplatin at an AUC of 4 given on day 1 and gemcitabine at 800 mg per meter squared given on days 1 and 8 every 3 weeks. Status post 6 cycles.   CURRENT THERAPY: Observation.  INTERVAL HISTORY: Destiny Harrison 72 y.o. female returns to the clinic today for followup visit accompanied by her son. The patient is feeling fine today with no specific complaints except for mild fatigue. She denied having any significant chest pain, shortness breath, cough or hemoptysis. She denied having any weight loss or night sweats. The patient has repeat CT scan of the chest, abdomen and pelvis performed recently and she is here for evaluation and discussion of her scan results.  MEDICAL HISTORY: Past Medical History  Diagnosis Date  . COPD (chronic obstructive pulmonary disease)   . Neutropenia, drug-induced 05/05/2012  . Hyperlipidemia   . Rheumatoid arthritis   . Asthma   . Hiatal hernia   . Non-small cell lung cancer dx'd 08/28/11    ALLERGIES:  has No Known  Allergies.  MEDICATIONS:  Current Outpatient Prescriptions  Medication Sig Dispense Refill  . albuterol (PROVENTIL HFA;VENTOLIN HFA) 108 (90 BASE) MCG/ACT inhaler Inhale 2 puffs into the lungs every 3 (three) hours as needed. WHEEZING AND SHORTNESS OF BREATH      . albuterol (PROVENTIL) (2.5 MG/3ML) 0.083% nebulizer solution 1 vial in neb four times daily with Ipratropium.  May add 1 vial every 3 hours as needed for wheezing/shortness of breath      . calcium-vitamin D (OSCAL WITH D) 500-200 MG-UNIT per tablet Take 1 tablet by mouth daily.      . folic acid (FOLVITE) 400 MCG tablet Take 400 mcg by mouth daily.      Marland Kitchen HYDROcodone-acetaminophen (VICODIN) 5-500 MG per tablet Take 1 tablet by mouth every 4 (four) hours as needed for pain.  60 tablet  0  . ipratropium (ATROVENT) 0.02 % nebulizer solution 1 vial in neb four times daily with Albuterol      . predniSONE (DELTASONE) 5 MG tablet Take 5 mg by mouth daily.        . simvastatin (ZOCOR) 5 MG tablet Take 5 mg by mouth at bedtime.       No current facility-administered medications for this visit.    SURGICAL HISTORY:  Past Surgical History  Procedure Laterality Date  . Appendex  1962  . Video bronchoscopy  01/28/2012    Procedure: VIDEO BRONCHOSCOPY WITHOUT FLUORO;  Surgeon: Kalman Shan, MD;  Location: [facility] ENDOSCOPY;  Service: Endoscopy;  Laterality: Bilateral;  . Surgery on right wrist      REVIEW OF SYSTEMS:  A comprehensive review of systems was negative except for: Constitutional: positive for fatigue   PHYSICAL EXAMINATION: General appearance: alert, cooperative and no distress Head: Normocephalic, without obvious abnormality, atraumatic Lymph nodes: Cervical, supraclavicular, and axillary nodes normal. Resp: clear to auscultation bilaterally Cardio: regular rate and rhythm, S1, S2 normal, no murmur, click, rub or gallop GI: soft, non-tender; bowel sounds normal; no masses,  no organomegaly Extremities: extremities normal,  atraumatic, no cyanosis or edema  ECOG PERFORMANCE STATUS: 1 - Symptomatic but completely ambulatory  Blood pressure 99/73, pulse 99, temperature 97.2 F (36.2 C), temperature source Oral, resp. rate 18, height 5\' 5"  (1.651 m), weight 139 lb 6.4 oz (63.231 kg), SpO2 75.00%.  LABORATORY DATA: Lab Results  Component Value Date   WBC 4.2 01/12/2013   HGB 11.4* 01/12/2013   HCT 35.3 01/12/2013   MCV 78.9* 01/12/2013   PLT 248 01/12/2013      Chemistry      Component Value Date/Time   NA 139 01/12/2013 0810   NA 140 10/12/2012 0520   K 4.3 01/12/2013 0810   K 3.8 10/12/2012 0520   CL 101 01/12/2013 0810   CL 104 10/12/2012 0520   CO2 28 01/12/2013 0810   CO2 29 10/12/2012 0520   BUN 10.4 01/12/2013 0810   BUN 6 10/12/2012 0520   CREATININE 0.8 01/12/2013 0810   CREATININE 0.71 10/12/2012 0520      Component Value Date/Time   CALCIUM 9.8 01/12/2013 0810   CALCIUM 8.9 10/12/2012 0520   ALKPHOS 75 01/12/2013 0810   ALKPHOS 75 10/09/2012 1252   AST 16 01/12/2013 0810   AST 20 10/09/2012 1252   ALT 12 01/12/2013 0810   ALT 12 10/09/2012 1252   BILITOT 0.44 01/12/2013 0810   BILITOT 0.4 10/09/2012 1252       RADIOGRAPHIC STUDIES: Ct Chest Wo Contrast  01/12/2013  *RADIOLOGY REPORT*  Clinical Data:  Metastatic lung cancer with bone metastases. Radiation therapy complete.  CT CHEST, ABDOMEN AND PELVIS WITHOUT CONTRAST  Technique:  Multidetector CT imaging of the chest, abdomen and pelvis was performed following the standard protocol without IV contrast.  Comparison:  CT 10/08/2012, 07/30/2012   CT CHEST  Findings:  There is a port in the right  chest wall.  No axillary lymphadenopathy.  11 mm right supraclavicular lymph node is not changed from 13 mm on prior.  Enlarged right paratracheal lymph node measures 17 x 25 mm not changed from 22 x 27 mm on prior. No pericardial fluid.  Review of the lung parenchyma demonstrates stable peribronchial thickening adjacent left hilum and mild  bronchiectasis.   There is mild increase in the parenchymal linear thickening anteriorly within the left upper lobe (image 19).  Stable apical nodular thickening (image 7).  IMPRESSION:  1.  Stable right paratracheal and right supraclavicular lymphadenopathy. 2.  Interval linear thickening in the anterior left upper lobe. This may represent progressive radiation change.  Cannot exclude recurrence.  Recommend close attention on follow-up.   CT ABDOMEN AND PELVIS  Findings:  Non-IV contrast images demonstrate no focal hepatic lesion.  Gallbladder, pancreas, spleen, adrenal glands, and kidneys are normal.  The stomach, small bowel and colon are unremarkable.  Abdominal aorta is normal caliber.  No retroperitoneal or periportal lymphadenopathy.  No mesenteric disease or peritoneal disease.  Uterus and bladder  normal.  No pelvic lymphadenopathy. Review of bone windows demonstrates no aggressive osseous lesions.  Paget's disease of the right hip.  IMPRESSION: No evidence metastasis in the abdomen and pelvis.   Original Report Authenticated By: Genevive Bi, M.D.     ASSESSMENT: This is a very pleasant 72 years old African American female was metastatic non-small cell lung cancer status post palliative radiotherapy followed by systemic chemotherapy with carboplatin and Abraxane with disease progression followed by chemotherapy was carboplatin and gemcitabine and status post 6 cycles with stable disease.  The patient is doing fine and she has no evidence for disease progression on his recent scan.   PLAN: I discussed the scan results with the patient and her son. I recommended for her to continue on observation with repeat CT scan of the chest, abdomen and pelvis in 3 months. She was advised to call immediately if she has any concerning symptoms in the interval.  All questions were answered. The patient knows to call the clinic with any problems, questions or concerns. We can certainly see the patient much  sooner if necessary.

## 2013-01-14 NOTE — Telephone Encounter (Signed)
, °

## 2013-01-16 NOTE — Patient Instructions (Signed)
No evidence for disease progression on his recent scan.  Followup in 3 months with repeat CT scan of the chest, abdomen and pelvis.

## 2013-01-21 DIAGNOSIS — E785 Hyperlipidemia, unspecified: Secondary | ICD-10-CM | POA: Diagnosis not present

## 2013-01-21 DIAGNOSIS — E663 Overweight: Secondary | ICD-10-CM | POA: Diagnosis not present

## 2013-01-21 DIAGNOSIS — M899 Disorder of bone, unspecified: Secondary | ICD-10-CM | POA: Diagnosis not present

## 2013-01-21 DIAGNOSIS — G609 Hereditary and idiopathic neuropathy, unspecified: Secondary | ICD-10-CM | POA: Diagnosis not present

## 2013-01-21 DIAGNOSIS — Z0000 Encounter for general adult medical examination without abnormal findings: Secondary | ICD-10-CM | POA: Diagnosis not present

## 2013-01-21 DIAGNOSIS — Z1331 Encounter for screening for depression: Secondary | ICD-10-CM | POA: Diagnosis not present

## 2013-02-02 DIAGNOSIS — J45909 Unspecified asthma, uncomplicated: Secondary | ICD-10-CM | POA: Diagnosis not present

## 2013-02-02 DIAGNOSIS — E785 Hyperlipidemia, unspecified: Secondary | ICD-10-CM | POA: Diagnosis not present

## 2013-02-02 DIAGNOSIS — M899 Disorder of bone, unspecified: Secondary | ICD-10-CM | POA: Diagnosis not present

## 2013-02-02 DIAGNOSIS — G609 Hereditary and idiopathic neuropathy, unspecified: Secondary | ICD-10-CM | POA: Diagnosis not present

## 2013-02-09 ENCOUNTER — Other Ambulatory Visit: Payer: Self-pay | Admitting: *Deleted

## 2013-02-09 MED ORDER — HYDROCODONE-ACETAMINOPHEN 5-500 MG PO TABS: 1.0000 | ORAL_TABLET | ORAL | Status: DC | PRN

## 2013-02-09 MED ORDER — HYDROCODONE-ACETAMINOPHEN 5-325 MG PO TABS: 1.0000 | ORAL_TABLET | ORAL | Status: DC | PRN

## 2013-02-09 NOTE — Addendum Note (Signed)
Addended by: Caren Griffins on: 02/09/2013 04:31 PM   Modules accepted: Orders, Medications

## 2013-04-09 NOTE — Telephone Encounter (Signed)
e

## 2013-04-15 ENCOUNTER — Ambulatory Visit (HOSPITAL_COMMUNITY)
Admission: RE | Admit: 2013-04-15 | Discharge: 2013-04-15 | Disposition: A | Discharge status: Home / Self Care | Payer: Medicare Other | Source: Ambulatory Visit | Attending: Internal Medicine | Admitting: Internal Medicine

## 2013-04-15 ENCOUNTER — Other Ambulatory Visit: Payer: Self-pay | Admitting: Internal Medicine

## 2013-04-15 ENCOUNTER — Other Ambulatory Visit (HOSPITAL_BASED_OUTPATIENT_CLINIC_OR_DEPARTMENT_OTHER): Payer: Medicare Other | Admitting: Lab

## 2013-04-15 DIAGNOSIS — C3490 Malignant neoplasm of unspecified part of unspecified bronchus or lung: Secondary | ICD-10-CM | POA: Insufficient documentation

## 2013-04-15 DIAGNOSIS — J8410 Pulmonary fibrosis, unspecified: Secondary | ICD-10-CM | POA: Diagnosis not present

## 2013-04-15 DIAGNOSIS — R599 Enlarged lymph nodes, unspecified: Secondary | ICD-10-CM | POA: Insufficient documentation

## 2013-04-15 DIAGNOSIS — I700 Atherosclerosis of aorta: Secondary | ICD-10-CM | POA: Diagnosis not present

## 2013-04-15 LAB — COMPREHENSIVE METABOLIC PANEL (CC13)
ALT: 6 U/L (ref 0–55)
AST: 15 U/L (ref 5–34)
Alkaline Phosphatase: 75 U/L (ref 40–150)
BUN: 9.6 mg/dL (ref 7.0–26.0)
Calcium: 9.7 mg/dL (ref 8.4–10.4)
Chloride: 101 mEq/L (ref 98–109)
Creatinine: 0.9 mg/dL (ref 0.6–1.1)

## 2013-04-15 LAB — CBC WITH DIFFERENTIAL/PLATELET
BASO%: 0.5 % (ref 0.0–2.0)
Basophils Absolute: 0 10*3/uL (ref 0.0–0.1)
EOS%: 5.6 % (ref 0.0–7.0)
HCT: 34.5 % — ABNORMAL LOW (ref 34.8–46.6)
MCH: 25.6 pg (ref 25.1–34.0)
MCHC: 33 g/dL (ref 31.5–36.0)
MCV: 77.4 fL — ABNORMAL LOW (ref 79.5–101.0)
MONO%: 16.8 % — ABNORMAL HIGH (ref 0.0–14.0)
NEUT%: 32.6 % — ABNORMAL LOW (ref 38.4–76.8)
RDW: 15.5 % — ABNORMAL HIGH (ref 11.2–14.5)
lymph#: 1.8 10*3/uL (ref 0.9–3.3)

## 2013-04-15 MED ORDER — IOHEXOL 300 MG/ML  SOLN
100.0000 mL | Freq: Once | INTRAMUSCULAR | Status: AC | PRN
  Administered 2013-04-15: 100 mL via INTRAVENOUS

## 2013-04-20 ENCOUNTER — Encounter: Payer: Self-pay | Admitting: Internal Medicine

## 2013-04-20 ENCOUNTER — Telehealth: Payer: Self-pay | Admitting: Internal Medicine

## 2013-04-20 ENCOUNTER — Ambulatory Visit (HOSPITAL_BASED_OUTPATIENT_CLINIC_OR_DEPARTMENT_OTHER): Payer: Medicare Other | Admitting: Internal Medicine

## 2013-04-20 DIAGNOSIS — C3490 Malignant neoplasm of unspecified part of unspecified bronchus or lung: Secondary | ICD-10-CM

## 2013-04-20 NOTE — Patient Instructions (Signed)
The CT scan showed stable disease except for the high right paratracheal lymph node which is slightly increased in size. I will refer to Dr. Michell Heinrich for consideration of palliative radiotherapy to this area. Followup visit in 3 months with repeat CT scans.

## 2013-04-20 NOTE — Progress Notes (Signed)
[facility] [phone]   Fax:([phone]  OFFICE PROGRESS NOTE  Destiny Carrow, MD No address on file  DIAGNOSIS: Metastatic non-small cell lung cancer, squamous cell carcinoma diagnosed in March of 2013.   PRIOR THERAPY:  1. Status post palliative radiotherapy to the left lung mass under the care of Dr. Michell Heinrich completed on 03/06/2012.  2. Systemic chemotherapy with carboplatin for AUC of 6 on day 1 and Abraxane 100 mg/M2 on days 1, 8 and 15 every 3 weeks. Status post 2 cycles. From cycle 3 forward AUC will be decreased to 4.5 given on day 1 and the Abraxane will be decreased to 90 mg per meter squared on days 1, 8 and 15 every 3 weeks, Status post a total of 3 cycles.  3. Systemic chemotherapy with carboplatin for AUC of 5 on day 1 and gemcitabine 1000 mg/m2 given on day 1 and day 8 every 3 weeks,status post 1 cycle. Due to significant neutropenia she will be dosed reduced beginning cycle 2 forward to carboplatin at an AUC of 4 given on day 1 and gemcitabine at 800 mg per meter squared given on days 1 and 8 every 3 weeks. Status post 6 cycles.   CURRENT THERAPY: Observation.  INTERVAL HISTORY: Destiny Harrison 72 y.o. female returns to the clinic today for three-month followup visit accompanied by her son. The patient is feeling fine today with no specific complaints except for mild fatigue and shortness breath with exertion. She denied having any significant chest pain, cough or hemoptysis. The patient denied having any significant weight loss or night sweats. She had repeat CT scan of the chest, abdomen and pelvis performed recently and she is here for evaluation and discussion of her scan results.  MEDICAL HISTORY: Past Medical History  Diagnosis Date  . COPD (chronic obstructive pulmonary disease)   . Neutropenia, drug-induced 05/05/2012  . Hyperlipidemia   . Rheumatoid arthritis(714.0)   . Asthma   . Hiatal hernia   . Non-small cell lung cancer  dx'd 08/28/11    ALLERGIES:  has No Known Allergies.  MEDICATIONS:  Current Outpatient Prescriptions  Medication Sig Dispense Refill  . albuterol (PROVENTIL HFA;VENTOLIN HFA) 108 (90 BASE) MCG/ACT inhaler Inhale 2 puffs into the lungs every 3 (three) hours as needed. WHEEZING AND SHORTNESS OF BREATH      . albuterol (PROVENTIL) (2.5 MG/3ML) 0.083% nebulizer solution 1 vial in neb four times daily with Ipratropium.  May add 1 vial every 3 hours as needed for wheezing/shortness of breath      . calcium-vitamin D (OSCAL WITH D) 500-200 MG-UNIT per tablet Take 1 tablet by mouth daily.      . folic acid (FOLVITE) 400 MCG tablet Take 400 mcg by mouth daily.      Marland Kitchen HYDROcodone-acetaminophen (NORCO/VICODIN) 5-325 MG per tablet TAKE 1 TABLET BY MOUTH EVERY 4 HOURS AS NEEDED FOR PAIN  60 tablet  0  . ipratropium (ATROVENT) 0.02 % nebulizer solution 1 vial in neb four times daily with Albuterol      . predniSONE (DELTASONE) 5 MG tablet Take 5 mg by mouth daily.        . simvastatin (ZOCOR) 5 MG tablet Take 5 mg by mouth at bedtime.       No current facility-administered medications for this visit.    SURGICAL HISTORY:  Past Surgical History  Procedure Laterality Date  . Appendex  1962  . Video bronchoscopy  01/28/2012    Procedure: VIDEO  BRONCHOSCOPY WITHOUT FLUORO;  Surgeon: Kalman Shan, MD;  Location: [facility] ENDOSCOPY;  Service: Endoscopy;  Laterality: Bilateral;  . Surgery on right wrist      REVIEW OF SYSTEMS:  A comprehensive review of systems was negative except for: Constitutional: positive for fatigue Respiratory: positive for dyspnea on exertion   PHYSICAL EXAMINATION: General appearance: alert, cooperative, fatigued and no distress Head: Normocephalic, without obvious abnormality, atraumatic Neck: no adenopathy Lymph nodes: Cervical, supraclavicular, and axillary nodes normal. Resp: clear to auscultation bilaterally Cardio: regular rate and rhythm, S1, S2 normal, no murmur, click,  rub or gallop GI: soft, non-tender; bowel sounds normal; no masses,  no organomegaly Extremities: extremities normal, atraumatic, no cyanosis or edema  ECOG PERFORMANCE STATUS: 1 - Symptomatic but completely ambulatory  Blood pressure 126/60, pulse 90, temperature 98.4 F (36.9 C), temperature source Oral, resp. rate 19, height 5\' 5"  (1.651 m), weight 138 lb 12.8 oz (62.959 kg), SpO2 98.00%.  LABORATORY DATA: Lab Results  Component Value Date   WBC 4.1 04/15/2013   HGB 11.4* 04/15/2013   HCT 34.5* 04/15/2013   MCV 77.4* 04/15/2013   PLT 251 04/15/2013      Chemistry      Component Value Date/Time   NA 138 04/15/2013 0839   NA 140 10/12/2012 0520   K 3.9 04/15/2013 0839   K 3.8 10/12/2012 0520   CL 101 01/12/2013 0810   CL 104 10/12/2012 0520   CO2 27 04/15/2013 0839   CO2 29 10/12/2012 0520   BUN 9.6 04/15/2013 0839   BUN 6 10/12/2012 0520   CREATININE 0.9 04/15/2013 0839   CREATININE 0.71 10/12/2012 0520      Component Value Date/Time   CALCIUM 9.7 04/15/2013 0839   CALCIUM 8.9 10/12/2012 0520   ALKPHOS 75 04/15/2013 0839   ALKPHOS 75 10/09/2012 1252   AST 15 04/15/2013 0839   AST 20 10/09/2012 1252   ALT 6 04/15/2013 0839   ALT 12 10/09/2012 1252   BILITOT 0.52 04/15/2013 0839   BILITOT 0.4 10/09/2012 1252       RADIOGRAPHIC STUDIES: Ct Chest W Contrast  04/15/2013   *RADIOLOGY REPORT*  Clinical Data:  Lung cancer restaging  CT CHEST, ABDOMEN AND PELVIS WITH CONTRAST  Technique:  Multidetector CT imaging of the chest, abdomen and pelvis was performed following the standard protocol during bolus administration of intravenous contrast.  Contrast: OMNIPAQUE IOHEXOL 300 MG/ML  SOLN  Comparison:  01/12/2013    CT CHEST  Findings:  There is no pleural effusion noted.  Moderate changes of the centrilobular emphysema identified.  Architectural distortion and fibrosis are identified within the paramediastinal left lung consistent with changes of external beam radiation.  There is  no suspicious pulmonary parenchymal nodule or mass identified.  Heart size is normal.  No pericardial effusion.  Right paratracheal lymph node mass measures 1.6 x 2.9 cm, image 12/series 2.  This is compared with 1.8 x 2.6 cm.  There is a left paratracheal lymph node which measures 7 mm, image 10/series 2.  Previously 9 mm. High right paratracheal lymph node measures 1.5 cm, image number five/series 2.  Previously 0.8 cm.  Right supraclavicular lymph node measures 1.1 cm, image 4/series 2.  Previously this measured the same.  No axillary adenopathy.  Review of the visualized osseous structures shows no aggressive lytic or sclerotic bone lesions.  IMPRESSION:  1. Mixed there is a high right paratracheal lymph node which has increased in size from previous exam.  Lower right paratracheal  lymph node has remained stable.  Left paratracheal lymph node is slightly decreased in size from previous exam.  Stable right supraclavicular lymph node. 2.  Stable appearance of the left lung.    CT ABDOMEN AND PELVIS  Findings:  There are no focal liver abnormalities identified.  The gallbladder is normal.  No biliary dilatation.  Normal appearance of the pancreas.  The spleen is unremarkable.  Normal appearance of the adrenal glands.  The right kidney is normal.  Left kidney is normal.  Urinary bladder is unremarkable. The uterus and adnexal structures are unremarkable.  Mild calcified atherosclerotic disease affects the abdominal aorta. There is no aneurysm.  No upper abdominal adenopathy identified. There is no pelvic or inguinal adenopathy.  The stomach and small bowel loops are unremarkable.  The colon is unremarkable.  Review of the visualized bony structures shows pagetoid changes involving the right proximal femur.  There is degenerative disc disease within the lower lumbar spine.  No aggressive lytic or sclerotic bone lesions noted peri  IMPRESSION:  1.  Stable CT of the abdomen and pelvis. 2.  No evidence for metastatic  disease   Original Report Authenticated By: Signa Kell, M.D.   ASSESSMENT AND PLAN: This is a very pleasant 72 years old African American female with metastatic non-small cell lung cancer, squamous cell carcinoma status post systemic chemotherapy with carboplatin and gemcitabine and currently on observation. The patient is doing fine and CT scan of the chest, abdomen and pelvis showed stable disease except for an enlarged high right paratracheal lymph node. I discussed the scan results with the patient and her son. I recommended for her to see Dr. Michell Heinrich for consideration of palliative radiotherapy to this area.  I would see her back for followup visit in 3 months with repeat CT scan of the chest, abdomen and pelvis for restaging of her disease. She was advised to call immediately she has any concerning symptoms in the interval. All questions were answered. The patient knows to call the clinic with any problems, questions or concerns. We can certainly see the patient much sooner if necessary.

## 2013-04-20 NOTE — Telephone Encounter (Signed)
gv and printed appt sched and avs for pt...pt sched to see Dr. Michell Heinrich on 7.9.14 @ 2:00pm

## 2013-04-27 NOTE — Progress Notes (Addendum)
Histology and Location of Primary Cancer: non small cell squamous cell carcinoma - lung left upper lobe  Location(s) of Symptomatic tumor(s): enlarged high right paratracheal lymph node  Past/Anticipated chemotherapy by medical oncology, if any: Systemic chemotherapy with carboplatin for AUC of 6 on day 1 and Abraxane 100 mg/M2 on days 1, 8 and 15 every 3 weeks. Status post 2 cycles. From cycle 3 forward AUC will be decreased to 4.5 given on day 1 and the Abraxane will be decreased to 90 mg per meter squared on days 1, 8 and 15 every 3 weeks, Status post a total of 3 cycles. Systemic chemotherapy with carboplatin for AUC of 5 on day 1 and gemcitabine 1000 mg/m2 given on day 1 and day 8 every 3 weeks,status post 1 cycle. Due to significant neutropenia she will be dosed reduced beginning cycle 2 forward to carboplatin at an AUC of 4 given on day 1 and gemcitabine at 800 mg per meter squared given on days 1 and 8 every 3 weeks. Status post 6 cycles.    Patient's main complaints related to symptomatic tumor(s) are: coughing where she is not able to bring up anything.  Pain on a scale of 0-10 is: 7 in her middle back and left leg   Ambulatory status? Walker? Wheelchair?: ambulatory  SAFETY ISSUES:  Prior radiation? Yes - 30 gray to left hilum 02/24/2012-03/06/2012  Pacemaker/ICD? no  Possible current pregnancy? no  Is the patient on methotrexate? no  Additional Complaints / other details:  Destiny Harrison here for enlarged high right paratracheal lymph node.  She reports pain in her joints and middle back that she rates at a 7/10.  She does take norco twice a day but does not want to take them around the clock because she is worried that she will become dependant on them.

## 2013-04-28 ENCOUNTER — Ambulatory Visit (HOSPITAL_BASED_OUTPATIENT_CLINIC_OR_DEPARTMENT_OTHER): Payer: Medicare Other

## 2013-04-28 ENCOUNTER — Ambulatory Visit
Admission: RE | Admit: 2013-04-28 | Discharge: 2013-04-28 | Disposition: A | Discharge status: Home / Self Care | Payer: Medicare Other | Source: Ambulatory Visit | Attending: Radiation Oncology | Admitting: Radiation Oncology

## 2013-04-28 VITALS — BP 171/80 | HR 108 | Temp 98.3°F

## 2013-04-28 DIAGNOSIS — IMO0002 Reserved for concepts with insufficient information to code with codable children: Secondary | ICD-10-CM | POA: Insufficient documentation

## 2013-04-28 DIAGNOSIS — C771 Secondary and unspecified malignant neoplasm of intrathoracic lymph nodes: Secondary | ICD-10-CM | POA: Diagnosis not present

## 2013-04-28 DIAGNOSIS — Z452 Encounter for adjustment and management of vascular access device: Secondary | ICD-10-CM

## 2013-04-28 DIAGNOSIS — C3490 Malignant neoplasm of unspecified part of unspecified bronchus or lung: Secondary | ICD-10-CM | POA: Diagnosis not present

## 2013-04-28 DIAGNOSIS — Z923 Personal history of irradiation: Secondary | ICD-10-CM | POA: Insufficient documentation

## 2013-04-28 HISTORY — DX: Personal history of irradiation: Z92.3

## 2013-04-28 HISTORY — DX: Personal history of antineoplastic chemotherapy: Z92.21

## 2013-04-28 MED ORDER — HEPARIN SOD (PORK) LOCK FLUSH 100 UNIT/ML IV SOLN
500.0000 [IU] | Freq: Once | INTRAVENOUS | Status: AC
  Administered 2013-04-28: 500 [IU] via INTRAVENOUS
  Filled 2013-04-28: qty 5

## 2013-04-28 MED ORDER — SODIUM CHLORIDE 0.9 % IJ SOLN
10.0000 mL | INTRAMUSCULAR | Status: DC | PRN
  Administered 2013-04-28: 10 mL via INTRAVENOUS
  Filled 2013-04-28: qty 10

## 2013-04-28 NOTE — Patient Instructions (Addendum)
Call MD for problems 

## 2013-04-28 NOTE — Progress Notes (Signed)
Please see the Nurse Progress Note in the MD Initial Consult Encounter for this patient. 

## 2013-04-29 ENCOUNTER — Ambulatory Visit
Admission: RE | Admit: 2013-04-29 | Discharge: 2013-04-29 | Disposition: A | Discharge status: Home / Self Care | Payer: Medicare Other | Source: Ambulatory Visit | Attending: Radiation Oncology | Admitting: Radiation Oncology

## 2013-04-29 DIAGNOSIS — Z510 Encounter for antineoplastic radiation therapy: Secondary | ICD-10-CM | POA: Insufficient documentation

## 2013-04-29 DIAGNOSIS — C3490 Malignant neoplasm of unspecified part of unspecified bronchus or lung: Secondary | ICD-10-CM | POA: Insufficient documentation

## 2013-04-29 DIAGNOSIS — Z79899 Other long term (current) drug therapy: Secondary | ICD-10-CM | POA: Insufficient documentation

## 2013-04-29 DIAGNOSIS — M549 Dorsalgia, unspecified: Secondary | ICD-10-CM | POA: Insufficient documentation

## 2013-04-29 DIAGNOSIS — C771 Secondary and unspecified malignant neoplasm of intrathoracic lymph nodes: Secondary | ICD-10-CM | POA: Diagnosis not present

## 2013-04-29 DIAGNOSIS — R1310 Dysphagia, unspecified: Secondary | ICD-10-CM | POA: Diagnosis not present

## 2013-04-29 NOTE — Progress Notes (Signed)
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  She is over a year out from diagnosis and is feeling well. She continues to have chronic cough which is controlled with cough drops and Robitussin. She has received systemic therapy under the care of Dr. Arbutus Ped including carboplatin and gemcitabine. She had recent staging studies including a CT of the chest on 04/15/2013 which showed enlarging right paratracheal lymph nodes. The rest of her disease was stable or diminished in size. No evidence of further metastatic disease was noted. She is feeling well and doing well. She is accompanied by her son today. She would like to proceed forward with radiation to these enlarging right paratracheal metastases. She specifically denies any headaches or new bone pain. She continues to have back pain for which she takes Norco twice a day. This is stable.  Allergies: No Known Allergies  Medications:  Current Outpatient Prescriptions  Medication Sig Dispense Refill  . albuterol (PROVENTIL HFA;VENTOLIN HFA) 108 (90 BASE) MCG/ACT inhaler Inhale 2 puffs into the lungs every 3 (three) hours as needed. WHEEZING AND SHORTNESS OF BREATH      . calcium-vitamin D (OSCAL WITH D) 500-200 MG-UNIT per tablet Take 1 tablet by mouth daily.      . folic acid (FOLVITE) 400 MCG tablet Take 400 mcg by mouth daily.      Marland Kitchen HYDROcodone-acetaminophen (NORCO/VICODIN) 5-325 MG per tablet TAKE 1 TABLET BY MOUTH EVERY 4 HOURS AS NEEDED FOR PAIN  60 tablet  0  . predniSONE (DELTASONE) 5 MG tablet Take 5 mg by mouth daily.        . simvastatin (ZOCOR) 5 MG tablet Take 5 mg by mouth at  bedtime.      Marland Kitchen albuterol (PROVENTIL) (2.5 MG/3ML) 0.083% nebulizer solution 1 vial in neb four times daily with Ipratropium.  May add 1 vial every 3 hours as needed for wheezing/shortness of breath      . ipratropium (ATROVENT) 0.02 % nebulizer solution 1 vial in neb four times daily with Albuterol       No current facility-administered medications for this encounter.    Physical Exam:  Filed Vitals:   04/28/13 1359  BP: 138/68  Pulse: 100  Temp: 98.7 F (37.1 C)   she is a pleasant female in no distress sitting comfortably examining room chair. She coughs or in the exam. She is alert and oriented x3.  IMPRESSION: Destiny Harrison is a 72 y.o. female status post palliative radiation to the left hilum with an enlarging right paratracheal lymph nodes and no evidence of distant metastatic disease  PLAN:  We discussed the indications for radiation to this area. We discussed the process of simulation the placement tattoos. We discussed the risks of treatment to a previously treated area. We will be utilizing chemotherapy as this area will overlap with her previously treated area. I discussed 15 fractions with her in order to resect the previous radiated dose. We discussed treatment of this right supraclavicular node as well since it is in the treated area and matching to treat that area in the future would be difficult. We discussed the possible side effects of  treatment including but not limited to skin darkening, discomfort, lung damage, and dysphasia. Her main concern is making sure that her appointments are scheduled in the afternoons as her son does have dialysis Monday Wednesday Friday. She signed informed consent and agree to proceed forward. We scheduled her for simulation Thursday.    Lurline Hare, MD

## 2013-04-29 NOTE — Progress Notes (Signed)
[facility] Radiation Oncology Simulation and Treatment Planning Note   Name: Destiny Harrison MRN: [id number]  Date: 04/29/2013  DOB: [birth year]  Status: outpatient    DIAGNOSIS: Stage IV NSCLC with progressive disease in right paratracheal lymph node.    SIDE: right   CONSENT VERIFIED: yes   SET UP AND IMMOBILIZATION: Patient is setup supine with arms in a wing board.   NARRATIVE: The patient was brought to the CT Simulation planning suite.  Identity was confirmed.  All relevant records and images related to the planned course of therapy were reviewed.  Then, the patient was positioned in a stable reproducible clinical set-up for radiation therapy.  CT images were obtained.  Skin markings were placed.  The CT images were loaded into the planning software where the target and avoidance structures were contoured.  The radiation prescription was entered and confirmed.   TREATMENT PLANNING NOTE:  Treatment planning then occurred. I have requested IMRT planning due to previous treatment in this area.  IMRT is medically necessary due to proximity of the treated area to a previously treated area. I am taking into account possible dose overlap by decreasing the dose per fraction from 3 Gy to 2.5 Gy.   Special treatment procedure will be performed due to proximity to a treated area.

## 2013-04-30 DIAGNOSIS — M549 Dorsalgia, unspecified: Secondary | ICD-10-CM | POA: Diagnosis not present

## 2013-04-30 DIAGNOSIS — Z510 Encounter for antineoplastic radiation therapy: Secondary | ICD-10-CM | POA: Diagnosis not present

## 2013-04-30 DIAGNOSIS — C771 Secondary and unspecified malignant neoplasm of intrathoracic lymph nodes: Secondary | ICD-10-CM | POA: Diagnosis not present

## 2013-04-30 DIAGNOSIS — C3490 Malignant neoplasm of unspecified part of unspecified bronchus or lung: Secondary | ICD-10-CM | POA: Diagnosis not present

## 2013-04-30 DIAGNOSIS — R1310 Dysphagia, unspecified: Secondary | ICD-10-CM | POA: Diagnosis not present

## 2013-04-30 DIAGNOSIS — Z79899 Other long term (current) drug therapy: Secondary | ICD-10-CM | POA: Diagnosis not present

## 2013-05-03 DIAGNOSIS — Z79899 Other long term (current) drug therapy: Secondary | ICD-10-CM | POA: Diagnosis not present

## 2013-05-03 DIAGNOSIS — C3490 Malignant neoplasm of unspecified part of unspecified bronchus or lung: Secondary | ICD-10-CM | POA: Diagnosis not present

## 2013-05-03 DIAGNOSIS — R1310 Dysphagia, unspecified: Secondary | ICD-10-CM | POA: Diagnosis not present

## 2013-05-03 DIAGNOSIS — C771 Secondary and unspecified malignant neoplasm of intrathoracic lymph nodes: Secondary | ICD-10-CM | POA: Diagnosis not present

## 2013-05-03 DIAGNOSIS — Z510 Encounter for antineoplastic radiation therapy: Secondary | ICD-10-CM | POA: Diagnosis not present

## 2013-05-03 DIAGNOSIS — M549 Dorsalgia, unspecified: Secondary | ICD-10-CM | POA: Diagnosis not present

## 2013-05-07 DIAGNOSIS — C3490 Malignant neoplasm of unspecified part of unspecified bronchus or lung: Secondary | ICD-10-CM | POA: Diagnosis not present

## 2013-05-07 DIAGNOSIS — C771 Secondary and unspecified malignant neoplasm of intrathoracic lymph nodes: Secondary | ICD-10-CM | POA: Diagnosis not present

## 2013-05-07 DIAGNOSIS — Z510 Encounter for antineoplastic radiation therapy: Secondary | ICD-10-CM | POA: Diagnosis not present

## 2013-05-07 DIAGNOSIS — M549 Dorsalgia, unspecified: Secondary | ICD-10-CM | POA: Diagnosis not present

## 2013-05-07 DIAGNOSIS — Z79899 Other long term (current) drug therapy: Secondary | ICD-10-CM | POA: Diagnosis not present

## 2013-05-07 DIAGNOSIS — R1310 Dysphagia, unspecified: Secondary | ICD-10-CM | POA: Diagnosis not present

## 2013-05-10 ENCOUNTER — Ambulatory Visit
Admission: RE | Admit: 2013-05-10 | Discharge: 2013-05-10 | Disposition: A | Discharge status: Home / Self Care | Payer: Medicare Other | Source: Ambulatory Visit | Attending: Radiation Oncology | Admitting: Radiation Oncology

## 2013-05-10 DIAGNOSIS — C3490 Malignant neoplasm of unspecified part of unspecified bronchus or lung: Secondary | ICD-10-CM | POA: Diagnosis not present

## 2013-05-10 DIAGNOSIS — Z79899 Other long term (current) drug therapy: Secondary | ICD-10-CM | POA: Diagnosis not present

## 2013-05-10 DIAGNOSIS — C771 Secondary and unspecified malignant neoplasm of intrathoracic lymph nodes: Secondary | ICD-10-CM | POA: Diagnosis not present

## 2013-05-10 DIAGNOSIS — R1310 Dysphagia, unspecified: Secondary | ICD-10-CM | POA: Diagnosis not present

## 2013-05-10 DIAGNOSIS — Z510 Encounter for antineoplastic radiation therapy: Secondary | ICD-10-CM | POA: Diagnosis not present

## 2013-05-10 DIAGNOSIS — M549 Dorsalgia, unspecified: Secondary | ICD-10-CM | POA: Diagnosis not present

## 2013-05-11 ENCOUNTER — Ambulatory Visit
Admission: RE | Admit: 2013-05-11 | Discharge: 2013-05-11 | Disposition: A | Discharge status: Home / Self Care | Payer: Medicare Other | Source: Ambulatory Visit | Attending: Radiation Oncology | Admitting: Radiation Oncology

## 2013-05-11 ENCOUNTER — Encounter: Payer: Self-pay | Admitting: Radiation Oncology

## 2013-05-11 DIAGNOSIS — C3490 Malignant neoplasm of unspecified part of unspecified bronchus or lung: Secondary | ICD-10-CM | POA: Diagnosis not present

## 2013-05-11 DIAGNOSIS — M549 Dorsalgia, unspecified: Secondary | ICD-10-CM | POA: Diagnosis not present

## 2013-05-11 DIAGNOSIS — Z510 Encounter for antineoplastic radiation therapy: Secondary | ICD-10-CM | POA: Diagnosis not present

## 2013-05-11 DIAGNOSIS — C771 Secondary and unspecified malignant neoplasm of intrathoracic lymph nodes: Secondary | ICD-10-CM | POA: Diagnosis not present

## 2013-05-11 DIAGNOSIS — R1310 Dysphagia, unspecified: Secondary | ICD-10-CM | POA: Diagnosis not present

## 2013-05-11 DIAGNOSIS — Z79899 Other long term (current) drug therapy: Secondary | ICD-10-CM | POA: Diagnosis not present

## 2013-05-11 MED ORDER — RADIAPLEXRX EX GEL
Freq: Once | CUTANEOUS | Status: AC
  Administered 2013-05-11: 16:00:00 via TOPICAL

## 2013-05-11 NOTE — Progress Notes (Addendum)
Weekly rad txs re-tx chest 2/15 completed,  , pain an 8 on 1-10 scale, last vicodin taken yesterday, hacking dry cough, using robitussin at night, pain in thoracic across the back"feels like a rubber band getting tighter", nausea and drinking gingerale, pt education reviewed again, pt questions answered again, "I remember side effects " gave my business card and book ,radiaplex gel given 3:33 PM

## 2013-05-11 NOTE — Progress Notes (Signed)
Weekly Management Note Current Dose:4.5 Gy  Projected Dose:33.75 Gy   Narrative:  The patient presents for routine under treatment assessment.  CBCT/MVCT images/Port film x-rays were reviewed.  The chart was checked. C/o back pain controlled with meds. Has had on and off for many months. Not worsening.   Physical Findings:  Unchanged  Vitals:  Filed Vitals:   05/11/13 1527  BP: 137/77  Pulse: 104  Temp: 98.3 F (36.8 C)  Resp: 20   Weight:  Wt Readings from Last 3 Encounters:  05/11/13 140 lb 4.8 oz (63.64 kg)  04/20/13 138 lb 12.8 oz (62.959 kg)  01/14/13 139 lb 6.4 oz (63.231 kg)   Lab Results  Component Value Date   WBC 4.1 04/15/2013   HGB 11.4* 04/15/2013   HCT 34.5* 04/15/2013   MCV 77.4* 04/15/2013   PLT 251 04/15/2013   Lab Results  Component Value Date   CREATININE 0.9 04/15/2013   BUN 9.6 04/15/2013   NA 138 04/15/2013   K 3.9 04/15/2013   CL 101 01/12/2013   CO2 27 04/15/2013     Impression:  The patient is tolerating radiation.  Plan:  Continue treatment as planned. Continue pain meds. RN education performed.

## 2013-05-12 ENCOUNTER — Ambulatory Visit
Admission: RE | Admit: 2013-05-12 | Discharge: 2013-05-12 | Disposition: A | Discharge status: Home / Self Care | Payer: Medicare Other | Source: Ambulatory Visit | Attending: Radiation Oncology | Admitting: Radiation Oncology

## 2013-05-12 DIAGNOSIS — C771 Secondary and unspecified malignant neoplasm of intrathoracic lymph nodes: Secondary | ICD-10-CM | POA: Diagnosis not present

## 2013-05-12 DIAGNOSIS — R1310 Dysphagia, unspecified: Secondary | ICD-10-CM | POA: Diagnosis not present

## 2013-05-12 DIAGNOSIS — C3490 Malignant neoplasm of unspecified part of unspecified bronchus or lung: Secondary | ICD-10-CM | POA: Diagnosis not present

## 2013-05-12 DIAGNOSIS — Z79899 Other long term (current) drug therapy: Secondary | ICD-10-CM | POA: Diagnosis not present

## 2013-05-12 DIAGNOSIS — Z510 Encounter for antineoplastic radiation therapy: Secondary | ICD-10-CM | POA: Diagnosis not present

## 2013-05-12 DIAGNOSIS — M549 Dorsalgia, unspecified: Secondary | ICD-10-CM | POA: Diagnosis not present

## 2013-05-13 ENCOUNTER — Ambulatory Visit
Admission: RE | Admit: 2013-05-13 | Discharge: 2013-05-13 | Disposition: A | Discharge status: Home / Self Care | Payer: Medicare Other | Source: Ambulatory Visit | Attending: Radiation Oncology | Admitting: Radiation Oncology

## 2013-05-13 ENCOUNTER — Ambulatory Visit: Payer: Medicare Other | Admitting: Internal Medicine

## 2013-05-13 DIAGNOSIS — Z79899 Other long term (current) drug therapy: Secondary | ICD-10-CM | POA: Diagnosis not present

## 2013-05-13 DIAGNOSIS — Z510 Encounter for antineoplastic radiation therapy: Secondary | ICD-10-CM | POA: Diagnosis not present

## 2013-05-13 DIAGNOSIS — R1310 Dysphagia, unspecified: Secondary | ICD-10-CM | POA: Diagnosis not present

## 2013-05-13 DIAGNOSIS — C3490 Malignant neoplasm of unspecified part of unspecified bronchus or lung: Secondary | ICD-10-CM | POA: Diagnosis not present

## 2013-05-13 DIAGNOSIS — C771 Secondary and unspecified malignant neoplasm of intrathoracic lymph nodes: Secondary | ICD-10-CM | POA: Diagnosis not present

## 2013-05-13 DIAGNOSIS — M549 Dorsalgia, unspecified: Secondary | ICD-10-CM | POA: Diagnosis not present

## 2013-05-14 ENCOUNTER — Ambulatory Visit
Admission: RE | Admit: 2013-05-14 | Discharge: 2013-05-14 | Disposition: A | Discharge status: Home / Self Care | Payer: Medicare Other | Source: Ambulatory Visit | Attending: Radiation Oncology | Admitting: Radiation Oncology

## 2013-05-14 DIAGNOSIS — Z510 Encounter for antineoplastic radiation therapy: Secondary | ICD-10-CM | POA: Diagnosis not present

## 2013-05-14 DIAGNOSIS — C3490 Malignant neoplasm of unspecified part of unspecified bronchus or lung: Secondary | ICD-10-CM | POA: Diagnosis not present

## 2013-05-14 DIAGNOSIS — R1310 Dysphagia, unspecified: Secondary | ICD-10-CM | POA: Diagnosis not present

## 2013-05-14 DIAGNOSIS — M549 Dorsalgia, unspecified: Secondary | ICD-10-CM | POA: Diagnosis not present

## 2013-05-14 DIAGNOSIS — Z79899 Other long term (current) drug therapy: Secondary | ICD-10-CM | POA: Diagnosis not present

## 2013-05-14 DIAGNOSIS — C771 Secondary and unspecified malignant neoplasm of intrathoracic lymph nodes: Secondary | ICD-10-CM | POA: Diagnosis not present

## 2013-05-17 ENCOUNTER — Ambulatory Visit
Admission: RE | Admit: 2013-05-17 | Discharge: 2013-05-17 | Disposition: A | Discharge status: Home / Self Care | Payer: Medicare Other | Source: Ambulatory Visit | Attending: Radiation Oncology | Admitting: Radiation Oncology

## 2013-05-17 DIAGNOSIS — M549 Dorsalgia, unspecified: Secondary | ICD-10-CM | POA: Diagnosis not present

## 2013-05-17 DIAGNOSIS — R1310 Dysphagia, unspecified: Secondary | ICD-10-CM | POA: Diagnosis not present

## 2013-05-17 DIAGNOSIS — Z79899 Other long term (current) drug therapy: Secondary | ICD-10-CM | POA: Diagnosis not present

## 2013-05-17 DIAGNOSIS — Z510 Encounter for antineoplastic radiation therapy: Secondary | ICD-10-CM | POA: Diagnosis not present

## 2013-05-17 DIAGNOSIS — C771 Secondary and unspecified malignant neoplasm of intrathoracic lymph nodes: Secondary | ICD-10-CM | POA: Diagnosis not present

## 2013-05-17 DIAGNOSIS — C3490 Malignant neoplasm of unspecified part of unspecified bronchus or lung: Secondary | ICD-10-CM | POA: Diagnosis not present

## 2013-05-18 ENCOUNTER — Ambulatory Visit
Admission: RE | Admit: 2013-05-18 | Discharge: 2013-05-18 | Disposition: A | Discharge status: Home / Self Care | Payer: Medicare Other | Source: Ambulatory Visit | Attending: Radiation Oncology | Admitting: Radiation Oncology

## 2013-05-18 DIAGNOSIS — C771 Secondary and unspecified malignant neoplasm of intrathoracic lymph nodes: Secondary | ICD-10-CM | POA: Diagnosis not present

## 2013-05-18 DIAGNOSIS — C3490 Malignant neoplasm of unspecified part of unspecified bronchus or lung: Secondary | ICD-10-CM

## 2013-05-18 DIAGNOSIS — Z79899 Other long term (current) drug therapy: Secondary | ICD-10-CM | POA: Diagnosis not present

## 2013-05-18 DIAGNOSIS — Z510 Encounter for antineoplastic radiation therapy: Secondary | ICD-10-CM | POA: Diagnosis not present

## 2013-05-18 DIAGNOSIS — M549 Dorsalgia, unspecified: Secondary | ICD-10-CM | POA: Diagnosis not present

## 2013-05-18 DIAGNOSIS — R1310 Dysphagia, unspecified: Secondary | ICD-10-CM | POA: Diagnosis not present

## 2013-05-18 MED ORDER — HYDROCODONE-ACETAMINOPHEN 5-325 MG PO TABS: 1.0000 | ORAL_TABLET | ORAL | Status: DC | PRN

## 2013-05-18 NOTE — Progress Notes (Signed)
[facility]    Radiation Oncology [address] [place]     Maryln Gottron, M.D. [place], [place] [address]               Billie Lade, M.D., Ph.D. Phone: [phone]      Molli Hazard A. Kathrynn Running, M.D. Fax: [phone]      Radene Gunning, M.D., Ph.D.         Lurline Hare, M.D.         Grayland Jack, M.D Weekly Treatment Management Note  Name: Destiny Harrison     MRN: [id number]        CSN: [id number] Date: 05/18/2013      DOB: April 14, 1941  CC: [name], MD         Mohamed    Status: Outpatient  Diagnosis: The encounter diagnosis was Lung cancer, left.  Current Dose: 15.75 Gy  Current Fraction: 7  Planned Dose: 33.75 Gy  Narrative: Destiny Harrison was seen today for weekly treatment management. The chart was checked and MVCT  were reviewed. She is tolerating her radiation therapy well. She denies any swallowing problems breathing problems. She denies any significant fatigue. Patient did request a refill on her pain medication which we provided today.  Review of patient's allergies indicates no known allergies. Current Outpatient Prescriptions  Medication Sig Dispense Refill  . albuterol (PROVENTIL HFA;VENTOLIN HFA) 108 (90 BASE) MCG/ACT inhaler Inhale 2 puffs into the lungs every 3 (three) hours as needed. WHEEZING AND SHORTNESS OF BREATH      . albuterol (PROVENTIL) (2.5 MG/3ML) 0.083% nebulizer solution 1 vial in neb four times daily with Ipratropium.  May add 1 vial every 3 hours as needed for wheezing/shortness of breath      . calcium-vitamin D (OSCAL WITH D) 500-200 MG-UNIT per tablet Take 1 tablet by mouth daily.      . folic acid (FOLVITE) 400 MCG tablet Take 400 mcg by mouth daily.      . hyaluronate sodium (RADIAPLEXRX) GEL Apply topically 2 (two) times daily.      Marland Kitchen HYDROcodone-acetaminophen (NORCO/VICODIN) 5-325 MG per tablet TAKE 1 TABLET BY MOUTH EVERY 4 HOURS AS NEEDED FOR PAIN  60 tablet  0  . ipratropium (ATROVENT) 0.02 % nebulizer solution 1 vial  in neb four times daily with Albuterol      . predniSONE (DELTASONE) 5 MG tablet Take 5 mg by mouth daily.        . simvastatin (ZOCOR) 5 MG tablet Take 5 mg by mouth at bedtime.       No current facility-administered medications for this encounter.     Physical Examination:  height is 5\' 5"  (1.651 m) and weight is 140 lb 9.6 oz (63.776 kg). Her temperature is 98.1 F (36.7 C). Her blood pressure is 105/56 and her pulse is 81. Her oxygen saturation is 100%.    Wt Readings from Last 3 Encounters:  05/18/13 140 lb 9.6 oz (63.776 kg)  05/11/13 140 lb 4.8 oz (63.64 kg)  04/20/13 138 lb 12.8 oz (62.959 kg)     Lungs - Normal respiratory effort, chest expands symmetrically. Lungs are clear to auscultation, no crackles or wheezes.  Heart has regular rhythm and rate  Abdomen is soft and non tender with normal bowel sounds  Assessment:  Patient tolerating treatments well  Plan: Continue treatment per original radiation prescription

## 2013-05-18 NOTE — Progress Notes (Addendum)
Destiny Harrison here for weekly under treat visit.  She has had 6 fractions to her chest.  She denies pain.  She does have a dry cough and shortness of breath going up stairs.  She denies a sore throat.   Her skin is intact in the treatment area.  She is needing a refill on her norco and Atrovent.

## 2013-05-19 ENCOUNTER — Ambulatory Visit
Admission: RE | Admit: 2013-05-19 | Discharge: 2013-05-19 | Disposition: A | Discharge status: Home / Self Care | Payer: Medicare Other | Source: Ambulatory Visit | Attending: Radiation Oncology | Admitting: Radiation Oncology

## 2013-05-19 DIAGNOSIS — C3490 Malignant neoplasm of unspecified part of unspecified bronchus or lung: Secondary | ICD-10-CM | POA: Diagnosis not present

## 2013-05-19 DIAGNOSIS — Z510 Encounter for antineoplastic radiation therapy: Secondary | ICD-10-CM | POA: Diagnosis not present

## 2013-05-19 DIAGNOSIS — C771 Secondary and unspecified malignant neoplasm of intrathoracic lymph nodes: Secondary | ICD-10-CM | POA: Diagnosis not present

## 2013-05-19 DIAGNOSIS — Z79899 Other long term (current) drug therapy: Secondary | ICD-10-CM | POA: Diagnosis not present

## 2013-05-19 DIAGNOSIS — M549 Dorsalgia, unspecified: Secondary | ICD-10-CM | POA: Diagnosis not present

## 2013-05-19 DIAGNOSIS — R1310 Dysphagia, unspecified: Secondary | ICD-10-CM | POA: Diagnosis not present

## 2013-05-20 ENCOUNTER — Ambulatory Visit
Admission: RE | Admit: 2013-05-20 | Discharge: 2013-05-20 | Disposition: A | Discharge status: Home / Self Care | Payer: Medicare Other | Source: Ambulatory Visit | Attending: Radiation Oncology | Admitting: Radiation Oncology

## 2013-05-20 DIAGNOSIS — C771 Secondary and unspecified malignant neoplasm of intrathoracic lymph nodes: Secondary | ICD-10-CM | POA: Diagnosis not present

## 2013-05-20 DIAGNOSIS — R1310 Dysphagia, unspecified: Secondary | ICD-10-CM | POA: Diagnosis not present

## 2013-05-20 DIAGNOSIS — C3490 Malignant neoplasm of unspecified part of unspecified bronchus or lung: Secondary | ICD-10-CM | POA: Diagnosis not present

## 2013-05-20 DIAGNOSIS — M549 Dorsalgia, unspecified: Secondary | ICD-10-CM | POA: Diagnosis not present

## 2013-05-20 DIAGNOSIS — Z79899 Other long term (current) drug therapy: Secondary | ICD-10-CM | POA: Diagnosis not present

## 2013-05-20 DIAGNOSIS — Z510 Encounter for antineoplastic radiation therapy: Secondary | ICD-10-CM | POA: Diagnosis not present

## 2013-05-21 ENCOUNTER — Ambulatory Visit
Admission: RE | Admit: 2013-05-21 | Discharge: 2013-05-21 | Disposition: A | Discharge status: Home / Self Care | Payer: Medicare Other | Source: Ambulatory Visit | Attending: Radiation Oncology | Admitting: Radiation Oncology

## 2013-05-21 DIAGNOSIS — C3490 Malignant neoplasm of unspecified part of unspecified bronchus or lung: Secondary | ICD-10-CM | POA: Diagnosis not present

## 2013-05-21 DIAGNOSIS — C771 Secondary and unspecified malignant neoplasm of intrathoracic lymph nodes: Secondary | ICD-10-CM | POA: Diagnosis not present

## 2013-05-21 DIAGNOSIS — R1310 Dysphagia, unspecified: Secondary | ICD-10-CM | POA: Diagnosis not present

## 2013-05-21 DIAGNOSIS — Z79899 Other long term (current) drug therapy: Secondary | ICD-10-CM | POA: Diagnosis not present

## 2013-05-21 DIAGNOSIS — Z510 Encounter for antineoplastic radiation therapy: Secondary | ICD-10-CM | POA: Diagnosis not present

## 2013-05-21 DIAGNOSIS — M549 Dorsalgia, unspecified: Secondary | ICD-10-CM | POA: Diagnosis not present

## 2013-05-24 ENCOUNTER — Ambulatory Visit
Admission: RE | Admit: 2013-05-24 | Discharge: 2013-05-24 | Disposition: A | Discharge status: Home / Self Care | Payer: Medicare Other | Source: Ambulatory Visit | Attending: Radiation Oncology | Admitting: Radiation Oncology

## 2013-05-24 DIAGNOSIS — C3490 Malignant neoplasm of unspecified part of unspecified bronchus or lung: Secondary | ICD-10-CM | POA: Diagnosis not present

## 2013-05-24 DIAGNOSIS — Z510 Encounter for antineoplastic radiation therapy: Secondary | ICD-10-CM | POA: Diagnosis not present

## 2013-05-24 DIAGNOSIS — M549 Dorsalgia, unspecified: Secondary | ICD-10-CM | POA: Diagnosis not present

## 2013-05-24 DIAGNOSIS — C771 Secondary and unspecified malignant neoplasm of intrathoracic lymph nodes: Secondary | ICD-10-CM | POA: Diagnosis not present

## 2013-05-24 DIAGNOSIS — Z79899 Other long term (current) drug therapy: Secondary | ICD-10-CM | POA: Diagnosis not present

## 2013-05-24 DIAGNOSIS — R1310 Dysphagia, unspecified: Secondary | ICD-10-CM | POA: Diagnosis not present

## 2013-05-25 ENCOUNTER — Ambulatory Visit
Admission: RE | Admit: 2013-05-25 | Discharge: 2013-05-25 | Disposition: A | Discharge status: Home / Self Care | Payer: Medicare Other | Source: Ambulatory Visit | Attending: Radiation Oncology | Admitting: Radiation Oncology

## 2013-05-25 ENCOUNTER — Encounter: Payer: Self-pay | Admitting: Radiation Oncology

## 2013-05-25 DIAGNOSIS — Z79899 Other long term (current) drug therapy: Secondary | ICD-10-CM | POA: Diagnosis not present

## 2013-05-25 DIAGNOSIS — Z510 Encounter for antineoplastic radiation therapy: Secondary | ICD-10-CM | POA: Diagnosis not present

## 2013-05-25 DIAGNOSIS — C3490 Malignant neoplasm of unspecified part of unspecified bronchus or lung: Secondary | ICD-10-CM | POA: Diagnosis not present

## 2013-05-25 DIAGNOSIS — R1310 Dysphagia, unspecified: Secondary | ICD-10-CM | POA: Diagnosis not present

## 2013-05-25 DIAGNOSIS — M549 Dorsalgia, unspecified: Secondary | ICD-10-CM | POA: Diagnosis not present

## 2013-05-25 DIAGNOSIS — C771 Secondary and unspecified malignant neoplasm of intrathoracic lymph nodes: Secondary | ICD-10-CM | POA: Diagnosis not present

## 2013-05-25 MED ORDER — MAGIC MOUTHWASH W/LIDOCAINE: 5.0000 mL | Freq: Three times a day (TID) | ORAL | Status: DC | PRN

## 2013-05-25 NOTE — Progress Notes (Signed)
Weekly rad txs , Lt lung, rt chest/subclavian, 12./15 completed, loose non productive cough, difficulty swallowing foods, bowl oatmeal for breakfastm, grits and butter, last night chicken noodle soup, patient tearful, "feels like a big knot/lump at throatnot taking carafate or MMW, uses mouth wash to gargle, drinks plenty water, energy level a 7 on 1-10 scale, gets tired easily, not sleeping well, sat up last night chair, takes robitussin at night for cough, eases off some, no nausea today, does get sob with exertion 2:43 PM

## 2013-05-25 NOTE — Progress Notes (Signed)
Weekly Management Note Current Dose:27 Gy  Projected Dose: 33.75 Gy   Narrative:  The patient presents for routine under treatment assessment.  CBCT/MVCT images/Port film x-rays were reviewed.  The chart was checked. Some dysphagia to pills and liquids. No skin changes.   Physical Findings:  Unchanged  Vitals:  Filed Vitals:   05/25/13 1437  BP: 144/75  Pulse: 100  Temp: 98 F (36.7 C)  Resp: 22   Weight:  Wt Readings from Last 3 Encounters:  05/25/13 138 lb 3.2 oz (62.687 kg)  05/18/13 140 lb 9.6 oz (63.776 kg)  05/11/13 140 lb 4.8 oz (63.64 kg)   Lab Results  Component Value Date   WBC 4.1 04/15/2013   HGB 11.4* 04/15/2013   HCT 34.5* 04/15/2013   MCV 77.4* 04/15/2013   PLT 251 04/15/2013   Lab Results  Component Value Date   CREATININE 0.9 04/15/2013   BUN 9.6 04/15/2013   NA 138 04/15/2013   K 3.9 04/15/2013   CL 101 01/12/2013   CO2 27 04/15/2013     Impression:  The patient is tolerating radiation.  Plan:  Continue treatment as planned. Add MMW to aid in dysphagia. Follow up in 1 month. Call sooner as needed. F.u and scan with med onc in October.

## 2013-05-26 ENCOUNTER — Ambulatory Visit: Payer: Medicare Other

## 2013-05-27 ENCOUNTER — Ambulatory Visit
Admission: RE | Admit: 2013-05-27 | Discharge: 2013-05-27 | Disposition: A | Discharge status: Home / Self Care | Payer: Medicare Other | Source: Ambulatory Visit | Attending: Radiation Oncology | Admitting: Radiation Oncology

## 2013-05-27 DIAGNOSIS — M549 Dorsalgia, unspecified: Secondary | ICD-10-CM | POA: Diagnosis not present

## 2013-05-27 DIAGNOSIS — C771 Secondary and unspecified malignant neoplasm of intrathoracic lymph nodes: Secondary | ICD-10-CM | POA: Diagnosis not present

## 2013-05-27 DIAGNOSIS — C3490 Malignant neoplasm of unspecified part of unspecified bronchus or lung: Secondary | ICD-10-CM | POA: Diagnosis not present

## 2013-05-27 DIAGNOSIS — Z79899 Other long term (current) drug therapy: Secondary | ICD-10-CM | POA: Diagnosis not present

## 2013-05-27 DIAGNOSIS — Z510 Encounter for antineoplastic radiation therapy: Secondary | ICD-10-CM | POA: Diagnosis not present

## 2013-05-27 DIAGNOSIS — R1310 Dysphagia, unspecified: Secondary | ICD-10-CM | POA: Diagnosis not present

## 2013-05-27 NOTE — Progress Notes (Signed)
Pt in nursing after radiation treatment today stating her new prescription is not helping her swallow. Pt was prescribed MMW w/Lidocaine yesterday. Pt states "it feels like there's something right here in my throat, and I have to swallow around it". Pt indicated her lower neck. Pt states her swallowing is only painful w/some foods. Reviewed proper time to take MMW re: meals, discussed eating very small bites, soft foods, suggested soft foods for protein source. Informed pt that this sensation of having something in her throat will resolve in 2-3 weeks after completing radiation next Monday. Advised she continue to take MMW before meals, eat soft foods in very small bites. Pt states "I can't afford any more medicines, so I'll just keep doing what I'm doing now."  Pt also states it is difficult to swallow folic acid and calcium tabs; suggested she not take at this time. Pt states she had already considered that. Advised pt to inform nursing on Mon if she is still having difficulty eating/swallowing. Pt verbalized understanding, agreement w/above information. Message sent in basket to Dr Michell Heinrich.

## 2013-05-28 ENCOUNTER — Ambulatory Visit
Admission: RE | Admit: 2013-05-28 | Discharge: 2013-05-28 | Disposition: A | Discharge status: Home / Self Care | Payer: Medicare Other | Source: Ambulatory Visit | Attending: Radiation Oncology | Admitting: Radiation Oncology

## 2013-05-28 DIAGNOSIS — C3490 Malignant neoplasm of unspecified part of unspecified bronchus or lung: Secondary | ICD-10-CM | POA: Diagnosis not present

## 2013-05-28 DIAGNOSIS — Z79899 Other long term (current) drug therapy: Secondary | ICD-10-CM | POA: Diagnosis not present

## 2013-05-28 DIAGNOSIS — C771 Secondary and unspecified malignant neoplasm of intrathoracic lymph nodes: Secondary | ICD-10-CM | POA: Diagnosis not present

## 2013-05-28 DIAGNOSIS — M549 Dorsalgia, unspecified: Secondary | ICD-10-CM | POA: Diagnosis not present

## 2013-05-28 DIAGNOSIS — R1310 Dysphagia, unspecified: Secondary | ICD-10-CM | POA: Diagnosis not present

## 2013-05-28 DIAGNOSIS — Z510 Encounter for antineoplastic radiation therapy: Secondary | ICD-10-CM | POA: Diagnosis not present

## 2013-05-31 ENCOUNTER — Ambulatory Visit
Admission: RE | Admit: 2013-05-31 | Discharge: 2013-05-31 | Disposition: A | Discharge status: Home / Self Care | Payer: Medicare Other | Source: Ambulatory Visit | Attending: Radiation Oncology | Admitting: Radiation Oncology

## 2013-05-31 ENCOUNTER — Encounter: Payer: Self-pay | Admitting: Radiation Oncology

## 2013-05-31 DIAGNOSIS — R1310 Dysphagia, unspecified: Secondary | ICD-10-CM | POA: Diagnosis not present

## 2013-05-31 DIAGNOSIS — M549 Dorsalgia, unspecified: Secondary | ICD-10-CM | POA: Diagnosis not present

## 2013-05-31 DIAGNOSIS — C771 Secondary and unspecified malignant neoplasm of intrathoracic lymph nodes: Secondary | ICD-10-CM | POA: Diagnosis not present

## 2013-05-31 DIAGNOSIS — Z510 Encounter for antineoplastic radiation therapy: Secondary | ICD-10-CM | POA: Diagnosis not present

## 2013-05-31 DIAGNOSIS — C3490 Malignant neoplasm of unspecified part of unspecified bronchus or lung: Secondary | ICD-10-CM | POA: Diagnosis not present

## 2013-05-31 DIAGNOSIS — Z79899 Other long term (current) drug therapy: Secondary | ICD-10-CM | POA: Diagnosis not present

## 2013-06-01 ENCOUNTER — Ambulatory Visit: Payer: Medicare Other

## 2013-06-02 NOTE — Progress Notes (Addendum)
  Radiation Oncology         [phone] ________________________________  Name: Destiny Harrison MRN: [id number]  Date: 05/31/2013  DOB: 29-Jun-1941  End of Treatment Note  Diagnosis:   Right paratracheal LN and right neck nodes    Indication for treatment:  Palliative       Radiation treatment dates:   05/10/2013-05/31/2013  Site/dose:   Right paratracheal lymph nodes/ 33.75@2 .25 per fraction x 15 fractions  Beams/energy:   Helical tomotherapy delivered IMRT treatment using 6 MV photons with daily imaging.  Narrative: The patient tolerated radiation treatment relatively well.   She had slight sore throat but was able to maintain her weight through treatment.   Plan: The patient has completed radiation treatment. The patient will return to radiation oncology clinic for routine followup in one month. I advised them to call or return sooner if they have any questions or concerns related to their recovery or treatment.  ------------------------------------------------  Lurline Hare, MD

## 2013-06-09 ENCOUNTER — Ambulatory Visit (HOSPITAL_BASED_OUTPATIENT_CLINIC_OR_DEPARTMENT_OTHER): Payer: Medicare Other

## 2013-06-09 DIAGNOSIS — Z452 Encounter for adjustment and management of vascular access device: Secondary | ICD-10-CM | POA: Diagnosis not present

## 2013-06-09 DIAGNOSIS — C3490 Malignant neoplasm of unspecified part of unspecified bronchus or lung: Secondary | ICD-10-CM | POA: Diagnosis not present

## 2013-06-09 MED ORDER — HEPARIN SOD (PORK) LOCK FLUSH 100 UNIT/ML IV SOLN
500.0000 [IU] | Freq: Once | INTRAVENOUS | Status: AC
  Administered 2013-06-09: 500 [IU] via INTRAVENOUS
  Filled 2013-06-09: qty 5

## 2013-06-09 MED ORDER — SODIUM CHLORIDE 0.9 % IJ SOLN
10.0000 mL | INTRAMUSCULAR | Status: DC | PRN
  Administered 2013-06-09: 10 mL via INTRAVENOUS
  Filled 2013-06-09: qty 10

## 2013-06-11 ENCOUNTER — Telehealth: Payer: Self-pay | Admitting: Dietician

## 2013-06-29 ENCOUNTER — Encounter: Payer: Self-pay | Admitting: Internal Medicine

## 2013-06-29 ENCOUNTER — Ambulatory Visit (INDEPENDENT_AMBULATORY_CARE_PROVIDER_SITE_OTHER): Payer: Medicare Other | Admitting: Internal Medicine

## 2013-06-29 VITALS — BP 122/70 | HR 78 | Temp 97.9°F | Wt 139.4 lb

## 2013-06-29 DIAGNOSIS — J449 Chronic obstructive pulmonary disease, unspecified: Secondary | ICD-10-CM

## 2013-06-29 DIAGNOSIS — Z23 Encounter for immunization: Secondary | ICD-10-CM

## 2013-06-29 DIAGNOSIS — J4489 Other specified chronic obstructive pulmonary disease: Secondary | ICD-10-CM

## 2013-06-29 NOTE — Progress Notes (Signed)
Subjective:    Patient ID: Destiny Harrison, female    DOB: [birth year], 72 y.o.   MRN: [id number]  HPI #Ex-smoker: quit smoking about 2 years ago but had 20 pack smokng hx.    # RA - from age 10. Dr Dierdre Forth. Prednisone and Methotrexate  #COPD - moderate   -  PFTs - Gold stage 2 copd with asthma component. FEv1 1L/54%., 14% BD response on FVC, ratio 54, DLCO 42%  #NSCLC - diagnosed and  metastatic 2013 with rib lesion   - CT Chest 02/20/12 Left upper lobe endobronchial mass with hypermetabolic adenopathy extending to the contralateral mediastinum and deep to the right clavicular head, all consistent with primary bronchogenic carcinoma. Left eleventh rib lesion is highly worrisome for a metastatic lesion.    - status post palliative radiotherapy to the left lung mass and currently (as of 02/20/12)  undergoing systemic chemotherapy with carboplatin and Abraxane status post 1 dose.   based on bronchoscopy  1. - Status post palliative radiotherapy to the left lung mass under the care of Dr. Michell Heinrich completed on 03/06/2012.  2. Systemic chemotherapy with carboplatin for AUC of 6 on day 1 and Abraxane 100 mg/M2 on days 1, 8 and 15 every 3 weeks. Status post 2 cycles  From cycle 3 forward AUC will be decreased to 4.5 given on day 1 and the Abraxane will be decreased to 90 mg per meter squared on days 1, 8 and 15 every 3 weeks, Status post a total of 3 cycles      OV 11/10/2012  Followup for COPD  COPD - doing well. Overall COPD and ESAS shows huge improviement; see below. In terms of her cancer she has undergone chemotherapy and by her history she is on observation she is due for a CT scan in March 2014. Past, Family, Social reviewed: no change since last visit  REC #COPD  continue medications and follow Tammy Parrett med calendar  Any problems come sooner  Continue regular treadmill at home.  In addition do some light kettlebell exercises at home (2-5#), do 15 - 25 reps x 3 sets each time x 3  per week  - the specific exercise is called kettlebell swing. You can look this up in youtube. Be careful and do not overdo  - this is good for your core strength  #Followup  REturn to see me in 6 Months or sooner if needed   OV 06/29/2013  Followup for Gold stage II COPD last seen January 2014  - In terms of COPD she is doing well. Lung health is stable. COPD cat score is 17 and although worse from prior visit seems to be in the range of baseline. She is doing DuoNeb through her [employer] [facility]. She is complaining of the high cost of albuterol MDI. She has not had her flu shot but will have it today.  -Past medical history reviewed: June 2014 CT scan of the chest showed some recurrence of cancer in the mediastinum and she's had radiation therapy palliative according to her history  CAT COPD Symptom and Quality of Life Score ([employer] trademark)  June 2013 07/23/2012  11/10/2012  06/29/2013   Never Cough -> Cough all the time 4 5 2 3   No phlegm in chest -> Chest is full of phlegm 4 0 0 0-  No chest tightness -> Chest feels very tight 4 4 2 2   No dyspnea for 1 flight stairs/hill -> Very dyspneic for 1 flight of  stairs 4 5 0 3  No limitations for ADL at home -> Very limited with ADL at home 3 5 0 2  Confident leaving home -> Not at all confident leaving home 3 2 0 3  Sleep soundly -> Do not sleep soundly because of lung condition 5 5 0 2  Lots of Energy -> No energy at all 4 3 1 2   TOTAL Score (max 40)  31 29 5 17          Review of Systems  Constitutional: Negative for fever and unexpected weight change.  HENT: Negative for ear pain, nosebleeds, congestion, sore throat, rhinorrhea, sneezing, trouble swallowing, dental problem, postnasal drip and sinus pressure.   Eyes: Negative for redness and itching.  Respiratory: Positive for cough and shortness of breath. Negative for chest tightness and wheezing.   Cardiovascular: Negative for palpitations and leg swelling.   Gastrointestinal: Negative for nausea and vomiting.  Genitourinary: Negative for dysuria.  Musculoskeletal: Negative for joint swelling.  Skin: Negative for rash.  Neurological: Negative for headaches.  Hematological: Does not bruise/bleed easily.  Psychiatric/Behavioral: Negative for dysphoric mood. The patient is not nervous/anxious.    Current outpatient prescriptions:albuterol (PROVENTIL HFA;VENTOLIN HFA) 108 (90 BASE) MCG/ACT inhaler, Inhale 2 puffs into the lungs every 3 (three) hours as needed. WHEEZING AND SHORTNESS OF BREATH, Disp: , Rfl: ;  albuterol (PROVENTIL) (2.5 MG/3ML) 0.083% nebulizer solution, 1 vial in neb four times daily with Ipratropium.  May add 1 vial every 3 hours as needed for wheezing/shortness of breath, Disp: , Rfl:  calcium-vitamin D (OSCAL WITH D) 500-200 MG-UNIT per tablet, Take 1 tablet by mouth daily., Disp: , Rfl: ;  folic acid (FOLVITE) 400 MCG tablet, Take 400 mcg by mouth daily., Disp: , Rfl: ;  HYDROcodone-acetaminophen (NORCO/VICODIN) 5-325 MG per tablet, Take 1 tablet by mouth every 4 (four) hours as needed for pain., Disp: 60 tablet, Rfl: 0 ipratropium (ATROVENT) 0.02 % nebulizer solution, 1 vial in neb four times daily with Albuterol, Disp: , Rfl: ;  predniSONE (DELTASONE) 5 MG tablet, Take 5 mg by mouth daily. , Disp: , Rfl: ;  simvastatin (ZOCOR) 5 MG tablet, Take 5 mg by mouth at bedtime., Disp: , Rfl:      Objective:   Physical Exam  Vitals reviewed. Constitutional: She is oriented to person, place, and time. She appears well-developed and well-nourished. No distress.  Body mass index is 23.2 kg/(m^2).   HENT:  Head: Normocephalic and atraumatic.  Right Ear: External ear normal.  Left Ear: External ear normal.  Mouth/Throat: Oropharynx is clear and moist. No oropharyngeal exudate.  Eyes: Conjunctivae and EOM are normal. Pupils are equal, round, and reactive to light. Right eye exhibits no discharge. Left eye exhibits no discharge. No scleral  icterus.  Neck: Normal range of motion. Neck supple. No JVD present. No tracheal deviation present. No thyromegaly present.  Cardiovascular: Normal rate, regular rhythm, normal heart sounds and intact distal pulses.  Exam reveals no gallop and no friction rub.   No murmur heard. Pulmonary/Chest: Effort normal and breath sounds normal. No respiratory distress. She has no wheezes. She has no rales. She exhibits no tenderness.  protocath +  Abdominal: Soft. Bowel sounds are normal. She exhibits no distension and no mass. There is no tenderness. There is no rebound and no guarding.  Musculoskeletal: Normal range of motion. She exhibits no edema and no tenderness.  Lymphadenopathy:    She has no cervical adenopathy.  Neurological: She is alert and oriented to  person, place, and time. She has normal reflexes. No cranial nerve deficit. She exhibits normal muscle tone. Coordination normal.  Skin: Skin is warm and dry. No rash noted. She is not diaphoretic. No erythema. No pallor.  Psychiatric: She has a normal mood and affect. Her behavior is normal. Judgment and thought content normal.          Assessment & Plan:

## 2013-06-29 NOTE — Patient Instructions (Addendum)
#  COPD  - stable diseaese  - continue duoneb 4 times daily - use albuterol HFA as needed; call insurance to see what brand is cheaper - flu shot 06/29/2013  REturn to see me in 6  Months or sooner if needed

## 2013-07-04 NOTE — Assessment & Plan Note (Signed)
#  COPD  - stable diseaese  - continue duoneb 4 times daily - use albuterol HFA as needed; call insurance to see what brand is cheaper - flu shot 06/29/2013  REturn to see me in 6  Months or sooner if neede

## 2013-07-08 ENCOUNTER — Telehealth: Payer: Self-pay | Admitting: Medical Oncology

## 2013-07-08 NOTE — Telephone Encounter (Signed)
Requests refill for hydrocodone from Dr Roselind Messier. Her pharmacy could not get in touch with him today so pt called Dr Arbutus Ped. I will ask Dr. Asa Lente nurse to f/u.

## 2013-07-09 ENCOUNTER — Other Ambulatory Visit: Payer: Self-pay | Admitting: *Deleted

## 2013-07-09 DIAGNOSIS — C3490 Malignant neoplasm of unspecified part of unspecified bronchus or lung: Secondary | ICD-10-CM

## 2013-07-09 MED ORDER — HYDROCODONE-ACETAMINOPHEN 5-325 MG PO TABS: 1.0000 | ORAL_TABLET | ORAL | Status: DC | PRN

## 2013-07-20 ENCOUNTER — Other Ambulatory Visit (HOSPITAL_BASED_OUTPATIENT_CLINIC_OR_DEPARTMENT_OTHER): Payer: Medicare Other | Admitting: Lab

## 2013-07-20 ENCOUNTER — Encounter (HOSPITAL_COMMUNITY): Payer: Self-pay

## 2013-07-20 ENCOUNTER — Ambulatory Visit (HOSPITAL_COMMUNITY)
Admission: RE | Admit: 2013-07-20 | Discharge: 2013-07-20 | Disposition: A | Discharge status: Home / Self Care | Payer: Medicare Other | Source: Ambulatory Visit | Attending: Internal Medicine | Admitting: Internal Medicine

## 2013-07-20 DIAGNOSIS — R599 Enlarged lymph nodes, unspecified: Secondary | ICD-10-CM | POA: Insufficient documentation

## 2013-07-20 DIAGNOSIS — C3490 Malignant neoplasm of unspecified part of unspecified bronchus or lung: Secondary | ICD-10-CM

## 2013-07-20 DIAGNOSIS — D259 Leiomyoma of uterus, unspecified: Secondary | ICD-10-CM | POA: Diagnosis not present

## 2013-07-20 DIAGNOSIS — K7689 Other specified diseases of liver: Secondary | ICD-10-CM | POA: Diagnosis not present

## 2013-07-20 DIAGNOSIS — M889 Osteitis deformans of unspecified bone: Secondary | ICD-10-CM | POA: Insufficient documentation

## 2013-07-20 DIAGNOSIS — J438 Other emphysema: Secondary | ICD-10-CM | POA: Diagnosis not present

## 2013-07-20 DIAGNOSIS — R935 Abnormal findings on diagnostic imaging of other abdominal regions, including retroperitoneum: Secondary | ICD-10-CM | POA: Diagnosis not present

## 2013-07-20 LAB — CBC WITH DIFFERENTIAL/PLATELET
BASO%: 0.7 % (ref 0.0–2.0)
Basophils Absolute: 0 10*3/uL (ref 0.0–0.1)
EOS%: 6.9 % (ref 0.0–7.0)
HCT: 32.9 % — ABNORMAL LOW (ref 34.8–46.6)
HGB: 11 g/dL — ABNORMAL LOW (ref 11.6–15.9)
LYMPH%: 30.4 % (ref 14.0–49.7)
MCH: 26.1 pg (ref 25.1–34.0)
MCHC: 33.3 g/dL (ref 31.5–36.0)
MCV: 78.5 fL — ABNORMAL LOW (ref 79.5–101.0)
MONO%: 16.4 % — ABNORMAL HIGH (ref 0.0–14.0)
NEUT%: 45.6 % (ref 38.4–76.8)
Platelets: 273 10*3/uL (ref 145–400)

## 2013-07-20 LAB — COMPREHENSIVE METABOLIC PANEL (CC13)
ALT: 10 U/L (ref 0–55)
AST: 16 U/L (ref 5–34)
Alkaline Phosphatase: 81 U/L (ref 40–150)
BUN: 9.6 mg/dL (ref 7.0–26.0)
Calcium: 9.7 mg/dL (ref 8.4–10.4)
Chloride: 103 mEq/L (ref 98–109)
Creatinine: 0.8 mg/dL (ref 0.6–1.1)
Total Bilirubin: 0.59 mg/dL (ref 0.20–1.20)

## 2013-07-20 MED ORDER — IOHEXOL 300 MG/ML  SOLN
100.0000 mL | Freq: Once | INTRAMUSCULAR | Status: AC | PRN
  Administered 2013-07-20: 100 mL via INTRAVENOUS

## 2013-07-22 ENCOUNTER — Encounter: Payer: Self-pay | Admitting: Internal Medicine

## 2013-07-22 ENCOUNTER — Ambulatory Visit (HOSPITAL_BASED_OUTPATIENT_CLINIC_OR_DEPARTMENT_OTHER): Payer: Medicare Other | Admitting: Internal Medicine

## 2013-07-22 ENCOUNTER — Telehealth: Payer: Self-pay | Admitting: Internal Medicine

## 2013-07-22 ENCOUNTER — Ambulatory Visit: Payer: Medicare Other

## 2013-07-22 DIAGNOSIS — C3410 Malignant neoplasm of upper lobe, unspecified bronchus or lung: Secondary | ICD-10-CM

## 2013-07-22 DIAGNOSIS — J449 Chronic obstructive pulmonary disease, unspecified: Secondary | ICD-10-CM | POA: Diagnosis not present

## 2013-07-22 DIAGNOSIS — J4489 Other specified chronic obstructive pulmonary disease: Secondary | ICD-10-CM

## 2013-07-22 MED ORDER — SODIUM CHLORIDE 0.9 % IJ SOLN
10.0000 mL | INTRAMUSCULAR | Status: DC | PRN
  Filled 2013-07-22: qty 10

## 2013-07-22 MED ORDER — HEPARIN SOD (PORK) LOCK FLUSH 100 UNIT/ML IV SOLN
500.0000 [IU] | Freq: Once | INTRAVENOUS | Status: DC
  Filled 2013-07-22: qty 5

## 2013-07-22 NOTE — Progress Notes (Signed)
Destiny Harrison [phone]   [phone]  OFFICE PROGRESS NOTE  Destiny Carrow, MD No address on file  DIAGNOSIS: Metastatic non-small cell lung cancer, squamous cell carcinoma diagnosed in March of 2013.   PRIOR THERAPY:  1. Status post palliative radiotherapy to the left lung mass under the care of Dr. Michell Heinrich completed on 03/06/2012.  2. Systemic chemotherapy with carboplatin for AUC of 6 on day 1 and Abraxane 100 mg/M2 on days 1, 8 and 15 every 3 weeks. Status post 2 cycles. From cycle 3 forward AUC will be decreased to 4.5 given on day 1 and the Abraxane will be decreased to 90 mg per meter squared on days 1, 8 and 15 every 3 weeks, Status post a total of 3 cycles.  3. Systemic chemotherapy with carboplatin for AUC of 5 on day 1 and gemcitabine 1000 mg/m2 given on day 1 and day 8 every 3 weeks,status post 1 cycle. Due to significant neutropenia she will be dosed reduced beginning cycle 2 forward to carboplatin at an AUC of 4 given on day 1 and gemcitabine at 800 mg per meter squared given on days 1 and 8 every 3 weeks. Status post 6 cycles.   CURRENT THERAPY: Observation.  CHEMOTHERAPY INTENT: Palliative  CURRENT # OF CHEMOTHERAPY CYCLES: 0  CURRENT ANTIEMETICS: Compazine  CURRENT SMOKING STATUS: Former smoker  ORAL CHEMOTHERAPY AND CONSENT: None  CURRENT BISPHOSPHONATES USE: None  PAIN MANAGEMENT: 5/10 right shoulder currently on Norco  NARCOTICS INDUCED CONSTIPATION: None  LIVING WILL AND CODE STATUS: No CODE BLUE   INTERVAL HISTORY: Destiny Harrison 72 y.o. female returns to the clinic today for three-month followup visit accompanied by her husband. The patient is feeling fine today with no specific complaints. She denied having any significant weight loss or night sweats. She has no chest pain, shortness breath, cough or hemoptysis. The patient denied having any significant fever or chills, nausea or vomiting. She had repeat CT scan of  the chest, abdomen and pelvis performed recently and she is here for evaluation and discussion of her scan results.  MEDICAL HISTORY: Past Medical History  Diagnosis Date  . COPD (chronic obstructive pulmonary disease)   . Neutropenia, drug-induced 05/05/2012  . Hyperlipidemia   . Rheumatoid arthritis(714.0)   . Asthma   . Hiatal hernia   . Non-small cell lung cancer dx'd 08/28/11    left lung  . History of chemotherapy   . History of radiation therapy 03/06/2012    left hilum    ALLERGIES:  has No Known Allergies.  MEDICATIONS:  Current Outpatient Prescriptions  Medication Sig Dispense Refill  . albuterol (PROVENTIL) (2.5 MG/3ML) 0.083% nebulizer solution 1 vial in neb four times daily with Ipratropium.  May add 1 vial every 3 hours as needed for wheezing/shortness of breath      . calcium-vitamin D (OSCAL WITH D) 500-200 MG-UNIT per tablet Take 1 tablet by mouth daily.      . folic acid (FOLVITE) 400 MCG tablet Take 400 mcg by mouth daily.      Marland Kitchen HYDROcodone-acetaminophen (NORCO/VICODIN) 5-325 MG per tablet Take 1 tablet by mouth every 4 (four) hours as needed for pain.  60 tablet  0  . ipratropium (ATROVENT) 0.02 % nebulizer solution 1 vial in neb four times daily with Albuterol      . predniSONE (DELTASONE) 5 MG tablet Take 5 mg by mouth daily.       . simvastatin (ZOCOR) 5 MG tablet  Take 5 mg by mouth at bedtime.      Marland Kitchen albuterol (PROVENTIL HFA;VENTOLIN HFA) 108 (90 BASE) MCG/ACT inhaler Inhale 2 puffs into the lungs every 3 (three) hours as needed. WHEEZING AND SHORTNESS OF BREATH       No current facility-administered medications for this visit.    SURGICAL HISTORY:  Past Surgical History  Procedure Laterality Date  . Appendex  1962  . Video bronchoscopy  01/28/2012    Procedure: VIDEO BRONCHOSCOPY WITHOUT FLUORO;  Surgeon: Kalman Shan, MD;  Location: [facility] ENDOSCOPY;  Service: Endoscopy;  Laterality: Bilateral;  . Surgery on right wrist      REVIEW OF SYSTEMS:  A  comprehensive review of systems was negative.   PHYSICAL EXAMINATION: General appearance: alert, cooperative and no distress Head: Normocephalic, without obvious abnormality, atraumatic Neck: no adenopathy, no JVD, supple, symmetrical, trachea midline and thyroid not enlarged, symmetric, no tenderness/mass/nodules Lymph nodes: Cervical, supraclavicular, and axillary nodes normal. Resp: clear to auscultation bilaterally Back: symmetric, no curvature. ROM normal. No CVA tenderness. Cardio: regular rate and rhythm, S1, S2 normal, no murmur, click, rub or gallop GI: soft, non-tender; bowel sounds normal; no masses,  no organomegaly Extremities: extremities normal, atraumatic, no cyanosis or edema  ECOG PERFORMANCE STATUS: 1 - Symptomatic but completely ambulatory  Blood pressure 122/52, pulse 78, temperature 97.7 F (36.5 C), temperature source Oral, resp. rate 18, height 5\' 5"  (1.651 m), weight 139 lb 8 oz (63.277 kg), SpO2 100.00%.  LABORATORY DATA: Lab Results  Component Value Date   WBC 4.0 07/20/2013   HGB 11.0* 07/20/2013   HCT 32.9* 07/20/2013   MCV 78.5* 07/20/2013   PLT 273 07/20/2013      Chemistry      Component Value Date/Time   NA 139 07/20/2013 0806   NA 140 10/12/2012 0520   K 4.0 07/20/2013 0806   K 3.8 10/12/2012 0520   CL 101 01/12/2013 0810   CL 104 10/12/2012 0520   CO2 25 07/20/2013 0806   CO2 29 10/12/2012 0520   BUN 9.6 07/20/2013 0806   BUN 6 10/12/2012 0520   CREATININE 0.8 07/20/2013 0806   CREATININE 0.71 10/12/2012 0520      Component Value Date/Time   CALCIUM 9.7 07/20/2013 0806   CALCIUM 8.9 10/12/2012 0520   ALKPHOS 81 07/20/2013 0806   ALKPHOS 75 10/09/2012 1252   AST 16 07/20/2013 0806   AST 20 10/09/2012 1252   ALT 10 07/20/2013 0806   ALT 12 10/09/2012 1252   BILITOT 0.59 07/20/2013 0806   BILITOT 0.4 10/09/2012 1252       RADIOGRAPHIC STUDIES: Ct Chest W Contrast  07/20/2013   CLINICAL DATA:  Followup right lung carcinoma. Restaging.  EXAM:  CT CHEST, ABDOMEN, AND PELVIS WITH CONTRAST  TECHNIQUE: Multidetector CT imaging of the chest, abdomen and pelvis was performed following the standard protocol during bolus administration of intravenous contrast.  CONTRAST:  OMNIPAQUE IOHEXOL 300 MG/ML  SOLN  COMPARISON:  04/15/2013  FINDINGS:   CT CHEST FINDINGS  An irregular pulmonary nodule in the central right upper lobe measures 10 x 19 mm on image 29 which the has increased in size from approximately 8 x 15 mm on prior study. No other suspicious pulmonary nodules or masses are identified. Moderate pulmonary emphysema is seen as well as radiation changes in the paramediastinal lung zones mainly on the left.  Mediastinal lymphadenopathy shows mild improvement since previous study. Largest index node in the right paratracheal region measures 1.6 x 2.2  cm on image 13 compared with 1.8 x 2.9 cm previously. High right paratracheal lymph node measures 12 mm on image 6 compared with 15 mm previously. No new or increased lymphadenopathy identified within the thorax.  No evidence of pleural or pericardial effusion. No suspicious bone lesions identified.    CT ABDOMEN AND PELVIS FINDINGS  A 1.1 cm hypervascular lesion is again seen in the inferior aspect of the left hepatic lobe on image 58 which is stable and consistent with a benign etiology such as a vascular shunt or flash filling hemangioma. No other liver lesions are identified. The adrenal glands, pancreas, spleen, and right kidney are normal in appearance. Left lower pole renal parenchymal scarring is again noted however there is no evidence of renal masses or hydronephrosis.  Uterus and adnexal regions are unremarkable, except for a tiny less than 1 cm calcified fibroid in the uterine fundus. No other soft tissue masses or lymphadenopathy identified. No evidence of inflammatory process or abnormal fluid collections. No evidence of ascites, bowel wall thickening, or dilatation. No suspicious bone  lesions are identified. 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   IMPRESSION: CT CHEST IMPRESSION  Mild increase in size of central right upper lobe nodule, now measuring 10 x 19 mm compared to 8 x 15 mm previously.  Mild decrease in mediastinal lymphadenopathy. No new adenopathy identified.  Pulmonary emphysema and postradiation changes.  CT ABDOMEN AND PELVIS IMPRESSION  Stable exam. No evidence of metastatic disease or other acute findings.   Electronically Signed   By: Myles Rosenthal   On: 07/20/2013 10:05   ASSESSMENT AND PLAN: This is a very pleasant 72 years old African American female with metastatic non-small cell lung cancer, squamous cell carcinoma status post several chemotherapy regimen and currently on observation. The patient has no significant evidence for disease progression except for mild increase in the central right upper lobe nodule. I discussed the scan results with the patient and her husband I recommended for her to continue observation with close monitoring of the right upper lobe nodule. She would have repeat CT scan of the chest, abdomen and pelvis in 3 months and she would come back for followup visit at that time. She was advised to call immediately if she has any concerning symptoms in the interval.  The patient voices understanding of current disease status and treatment options and is in agreement with the current care plan.  All questions were answered. The patient knows to call the clinic with any problems, questions or concerns. We can certainly see the patient much sooner if necessary.

## 2013-07-22 NOTE — Patient Instructions (Signed)
CURRENT THERAPY: Observation.  CHEMOTHERAPY INTENT: Palliative  CURRENT # OF CHEMOTHERAPY CYCLES: 0  CURRENT ANTIEMETICS: Compazine  CURRENT SMOKING STATUS: Former smoker  ORAL CHEMOTHERAPY AND CONSENT: None  CURRENT BISPHOSPHONATES USE: None  PAIN MANAGEMENT: 5/10 right shoulder currently on Norco  NARCOTICS INDUCED CONSTIPATION: None  LIVING WILL AND CODE STATUS: No CODE BLUE

## 2013-07-22 NOTE — Telephone Encounter (Signed)
Gave pt appt for lab and MD on [place] [id number]

## 2013-07-22 NOTE — Patient Instructions (Addendum)
Implanted Port Instructions  An implanted port is a central line that has a round shape and is placed under the skin. It is used for long-term IV (intravenous) access for:  · Medicine.  · Fluids.  · Liquid nutrition, such as TPN (total parenteral nutrition).  · Blood samples.  Ports can be placed:  · In the chest area just below the collarbone (this is the most common place.)  · In the arms.  · In the belly (abdomen) area.  · In the legs.  PARTS OF THE PORT  A port has 2 main parts:  · The reservoir. The reservoir is round, disc-shaped, and will be a small, raised area under your skin.  · The reservoir is the part where a needle is inserted (accessed) to either give medicines or to draw blood.  · The catheter. The catheter is a long, slender tube that extends from the reservoir. The catheter is placed into a large vein.  · Medicine that is inserted into the reservoir goes into the catheter and then into the vein.  INSERTION OF THE PORT  · The port is surgically placed in either an operating room or in a procedural area (interventional radiology).  · Medicine may be given to help you relax during the procedure.  · The skin where the port will be inserted is numbed (local anesthetic).  · 1 or 2 small cuts (incisions) will be made in the skin to insert the port.  · The port can be used after it has been inserted.  INCISION SITE CARE  · The incision site may have small adhesive strips on it. This helps keep the incision site closed. Sometimes, no adhesive strips are placed. Instead of adhesive strips, a special kind of surgical glue is used to keep the incision closed.  · If adhesive strips were placed on the incision sites, do not take them off. They will fall off on their own.  · The incision site may be sore for 1 to 2 days. Pain medicine can help.  · Do not get the incision site wet. Bathe or shower as directed by your caregiver.  · The incision site should heal in 5 to 7 days. A small scar may form after the  incision has healed.  ACCESSING THE PORT  Special steps must be taken to access the port:  · Before the port is accessed, a numbing cream can be placed on the skin. This helps numb the skin over the port site.  · A sterile technique is used to access the port.  · The port is accessed with a needle. Only "non-coring" port needles should be used to access the port. Once the port is accessed, a blood return should be checked. This helps ensure the port is in the vein and is not clogged (clotted).  · If your caregiver believes your port should remain accessed, a clear (transparent) bandage will be placed over the needle site. The bandage and needle will need to be changed every week or as directed by your caregiver.  · Keep the bandage covering the needle clean and dry. Do not get it wet. Follow your caregiver's instructions on how to take a shower or bath when the port is accessed.  · If your port does not need to stay accessed, no bandage is needed over the port.  FLUSHING THE PORT  Flushing the port keeps it from getting clogged. How often the port is flushed depends on:  · If a   constant infusion is running. If a constant infusion is running, the port may not need to be flushed.  · If intermittent medicines are given.  · If the port is not being used.  For intermittent medicines:  · The port will need to be flushed:  · After medicines have been given.  · After blood has been drawn.  · As part of routine maintenance.  · A port is normally flushed with:  · Normal saline.  · Heparin.  · Follow your caregiver's advice on how often, how much, and the type of flush to use on your port.  IMPORTANT PORT INFORMATION  · Tell your caregiver if you are allergic to heparin.  · After your port is placed, you will get a manufacturer's information card. The card has information about your port. Keep this card with you at all times.  · There are many types of ports available. Know what kind of port you have.  · In case of an  emergency, it may be helpful to wear a medical alert bracelet. This can help alert health care workers that you have a port.  · The port can stay in for as long as your caregiver believes it is necessary.  · When it is time for the port to come out, surgery will be done to remove it. The surgery will be similar to how the port was put in.  · If you are in the hospital or clinic:  · Your port will be taken care of and flushed by a nurse.  · If you are at home:  · A home health care nurse may give medicines and take care of the port.  · You or a family member can get special training and directions for giving medicine and taking care of the port at home.  SEEK IMMEDIATE MEDICAL CARE IF:   · Your port does not flush or you are unable to get a blood return.  · New drainage or pus is coming from the incision.  · A bad smell is coming from the incision site.  · You develop swelling or increased redness at the incision site.  · You develop increased swelling or pain at the port site.  · You develop swelling or pain in the surrounding skin near the port.  · You have an oral temperature above 102° F (38.9° C), not controlled by medicine.  MAKE SURE YOU:   · Understand these instructions.  · Will watch your condition.  · Will get help right away if you are not doing well or get worse.  Document Released: 10/07/2005 Document Revised: 12/30/2011 Document Reviewed: 12/29/2008  [employer]® Patient Information ©2014 [employer].

## 2013-08-03 DIAGNOSIS — M899 Disorder of bone, unspecified: Secondary | ICD-10-CM | POA: Diagnosis not present

## 2013-08-03 DIAGNOSIS — E2839 Other primary ovarian failure: Secondary | ICD-10-CM | POA: Diagnosis not present

## 2013-08-03 DIAGNOSIS — E785 Hyperlipidemia, unspecified: Secondary | ICD-10-CM | POA: Diagnosis not present

## 2013-08-03 DIAGNOSIS — E559 Vitamin D deficiency, unspecified: Secondary | ICD-10-CM | POA: Diagnosis not present

## 2013-08-03 DIAGNOSIS — J45909 Unspecified asthma, uncomplicated: Secondary | ICD-10-CM | POA: Diagnosis not present

## 2013-08-11 ENCOUNTER — Telehealth: Payer: Self-pay | Admitting: Internal Medicine

## 2013-08-11 MED ORDER — ALBUTEROL SULFATE (2.5 MG/3ML) 0.083% IN NEBU: INHALATION_SOLUTION | RESPIRATORY_TRACT | Status: DC

## 2013-08-11 NOTE — Telephone Encounter (Signed)
Pt is aware RX has been sent. Nothing further needed 

## 2013-08-12 ENCOUNTER — Telehealth: Payer: Self-pay | Admitting: Internal Medicine

## 2013-08-12 MED ORDER — ALBUTEROL SULFATE HFA 108 (90 BASE) MCG/ACT IN AERS: 2.0000 | INHALATION_SPRAY | RESPIRATORY_TRACT | Status: DC | PRN

## 2013-08-12 NOTE — Telephone Encounter (Signed)
I spoke with pt. She is aware RX for the albuterol inhaler has been sent in. Nothing further needed

## 2013-08-18 ENCOUNTER — Telehealth: Payer: Self-pay | Admitting: *Deleted

## 2013-08-18 ENCOUNTER — Other Ambulatory Visit: Payer: Self-pay | Admitting: *Deleted

## 2013-08-18 ENCOUNTER — Other Ambulatory Visit: Payer: Self-pay | Admitting: Physician Assistant

## 2013-08-18 DIAGNOSIS — C3490 Malignant neoplasm of unspecified part of unspecified bronchus or lung: Secondary | ICD-10-CM

## 2013-08-18 MED ORDER — HYDROCODONE-ACETAMINOPHEN 5-325 MG PO TABS: 1.0000 | ORAL_TABLET | ORAL | Status: DC | PRN

## 2013-08-18 NOTE — Telephone Encounter (Signed)
Received call from pt requesting refill of pain med Vicodin.  Last prescription  07/09/13.  Informed pt that message will be relayed to [place], [place].   Pt can pick up prescription from injection nurse.  Pt voiced understanding and stated she will pick up prescription on 08/19/13.

## 2013-08-30 ENCOUNTER — Telehealth: Payer: Self-pay | Admitting: *Deleted

## 2013-08-30 NOTE — Telephone Encounter (Signed)
Patient called and canceled appt for 11/12. Rescheduled appt

## 2013-09-07 ENCOUNTER — Ambulatory Visit (HOSPITAL_BASED_OUTPATIENT_CLINIC_OR_DEPARTMENT_OTHER): Payer: Medicare Other

## 2013-09-07 DIAGNOSIS — Z452 Encounter for adjustment and management of vascular access device: Secondary | ICD-10-CM | POA: Diagnosis not present

## 2013-09-07 DIAGNOSIS — C3410 Malignant neoplasm of upper lobe, unspecified bronchus or lung: Secondary | ICD-10-CM | POA: Diagnosis not present

## 2013-09-07 MED ORDER — HEPARIN SOD (PORK) LOCK FLUSH 100 UNIT/ML IV SOLN
500.0000 [IU] | Freq: Once | INTRAVENOUS | Status: AC
  Administered 2013-09-07: 500 [IU] via INTRAVENOUS
  Filled 2013-09-07: qty 5

## 2013-09-07 MED ORDER — SODIUM CHLORIDE 0.9 % IJ SOLN
10.0000 mL | INTRAMUSCULAR | Status: DC | PRN
  Administered 2013-09-07: 10 mL via INTRAVENOUS
  Filled 2013-09-07: qty 10

## 2013-10-26 ENCOUNTER — Ambulatory Visit (HOSPITAL_COMMUNITY)
Admission: RE | Admit: 2013-10-26 | Discharge: 2013-10-26 | Disposition: A | Discharge status: Home / Self Care | Payer: Medicare Other | Source: Ambulatory Visit | Attending: Internal Medicine | Admitting: Internal Medicine

## 2013-10-26 ENCOUNTER — Ambulatory Visit: Payer: Medicare Other | Admitting: Internal Medicine

## 2013-10-26 ENCOUNTER — Telehealth: Payer: Self-pay | Admitting: *Deleted

## 2013-10-26 ENCOUNTER — Other Ambulatory Visit (HOSPITAL_BASED_OUTPATIENT_CLINIC_OR_DEPARTMENT_OTHER): Payer: Medicare Other

## 2013-10-26 ENCOUNTER — Other Ambulatory Visit: Payer: Self-pay | Admitting: *Deleted

## 2013-10-26 DIAGNOSIS — Z9221 Personal history of antineoplastic chemotherapy: Secondary | ICD-10-CM | POA: Diagnosis not present

## 2013-10-26 DIAGNOSIS — C3490 Malignant neoplasm of unspecified part of unspecified bronchus or lung: Secondary | ICD-10-CM

## 2013-10-26 DIAGNOSIS — R599 Enlarged lymph nodes, unspecified: Secondary | ICD-10-CM | POA: Insufficient documentation

## 2013-10-26 DIAGNOSIS — C7951 Secondary malignant neoplasm of bone: Secondary | ICD-10-CM | POA: Diagnosis not present

## 2013-10-26 DIAGNOSIS — R911 Solitary pulmonary nodule: Secondary | ICD-10-CM | POA: Diagnosis not present

## 2013-10-26 DIAGNOSIS — K449 Diaphragmatic hernia without obstruction or gangrene: Secondary | ICD-10-CM | POA: Insufficient documentation

## 2013-10-26 DIAGNOSIS — I2510 Atherosclerotic heart disease of native coronary artery without angina pectoris: Secondary | ICD-10-CM | POA: Diagnosis not present

## 2013-10-26 DIAGNOSIS — I7090 Unspecified atherosclerosis: Secondary | ICD-10-CM | POA: Insufficient documentation

## 2013-10-26 DIAGNOSIS — C3410 Malignant neoplasm of upper lobe, unspecified bronchus or lung: Secondary | ICD-10-CM

## 2013-10-26 DIAGNOSIS — J438 Other emphysema: Secondary | ICD-10-CM | POA: Insufficient documentation

## 2013-10-26 DIAGNOSIS — M948X9 Other specified disorders of cartilage, unspecified sites: Secondary | ICD-10-CM | POA: Diagnosis not present

## 2013-10-26 DIAGNOSIS — I700 Atherosclerosis of aorta: Secondary | ICD-10-CM | POA: Diagnosis not present

## 2013-10-26 DIAGNOSIS — N859 Noninflammatory disorder of uterus, unspecified: Secondary | ICD-10-CM | POA: Diagnosis not present

## 2013-10-26 DIAGNOSIS — R935 Abnormal findings on diagnostic imaging of other abdominal regions, including retroperitoneum: Secondary | ICD-10-CM | POA: Diagnosis not present

## 2013-10-26 DIAGNOSIS — C7952 Secondary malignant neoplasm of bone marrow: Secondary | ICD-10-CM

## 2013-10-26 LAB — COMPREHENSIVE METABOLIC PANEL (CC13)
ALBUMIN: 3.4 g/dL — AB (ref 3.5–5.0)
ALT: 11 U/L (ref 0–55)
ANION GAP: 10 meq/L (ref 3–11)
AST: 16 U/L (ref 5–34)
Alkaline Phosphatase: 93 U/L (ref 40–150)
BUN: 13.7 mg/dL (ref 7.0–26.0)
CO2: 27 mEq/L (ref 22–29)
Calcium: 9.6 mg/dL (ref 8.4–10.4)
Chloride: 102 mEq/L (ref 98–109)
Creatinine: 0.9 mg/dL (ref 0.6–1.1)
GLUCOSE: 99 mg/dL (ref 70–140)
POTASSIUM: 4.3 meq/L (ref 3.5–5.1)
SODIUM: 139 meq/L (ref 136–145)
TOTAL PROTEIN: 8.2 g/dL (ref 6.4–8.3)
Total Bilirubin: 0.32 mg/dL (ref 0.20–1.20)

## 2013-10-26 LAB — CBC WITH DIFFERENTIAL/PLATELET
BASO%: 0.5 % (ref 0.0–2.0)
BASOS ABS: 0 10*3/uL (ref 0.0–0.1)
EOS ABS: 0.3 10*3/uL (ref 0.0–0.5)
EOS%: 5.5 % (ref 0.0–7.0)
HCT: 33.4 % — ABNORMAL LOW (ref 34.8–46.6)
HGB: 11.3 g/dL — ABNORMAL LOW (ref 11.6–15.9)
LYMPH%: 29.2 % (ref 14.0–49.7)
MCH: 26.9 pg (ref 25.1–34.0)
MCHC: 33.9 g/dL (ref 31.5–36.0)
MCV: 79.3 fL — AB (ref 79.5–101.0)
MONO#: 0.8 10*3/uL (ref 0.1–0.9)
MONO%: 15.4 % — ABNORMAL HIGH (ref 0.0–14.0)
NEUT%: 49.4 % (ref 38.4–76.8)
NEUTROS ABS: 2.5 10*3/uL (ref 1.5–6.5)
PLATELETS: 306 10*3/uL (ref 145–400)
RBC: 4.21 10*6/uL (ref 3.70–5.45)
RDW: 15.6 % — AB (ref 11.2–14.5)
WBC: 5.1 10*3/uL (ref 3.9–10.3)
lymph#: 1.5 10*3/uL (ref 0.9–3.3)

## 2013-10-26 MED ORDER — HYDROCODONE-ACETAMINOPHEN 5-325 MG PO TABS: 1.0000 | ORAL_TABLET | ORAL | Status: DC | PRN

## 2013-10-26 MED ORDER — IOHEXOL 300 MG/ML  SOLN
100.0000 mL | Freq: Once | INTRAMUSCULAR | Status: AC | PRN
  Administered 2013-10-26: 100 mL via INTRAVENOUS

## 2013-10-26 NOTE — Telephone Encounter (Signed)
THE RESULTS WERE CALLED AND FAXED. THIS REPORT WAS GIVEN TO DR.MOHAMED.

## 2013-10-28 ENCOUNTER — Encounter: Payer: Self-pay | Admitting: Internal Medicine

## 2013-10-28 ENCOUNTER — Ambulatory Visit (HOSPITAL_BASED_OUTPATIENT_CLINIC_OR_DEPARTMENT_OTHER): Payer: Medicare Other | Admitting: Internal Medicine

## 2013-10-28 ENCOUNTER — Telehealth: Payer: Self-pay | Admitting: Internal Medicine

## 2013-10-28 DIAGNOSIS — J449 Chronic obstructive pulmonary disease, unspecified: Secondary | ICD-10-CM

## 2013-10-28 DIAGNOSIS — M069 Rheumatoid arthritis, unspecified: Secondary | ICD-10-CM

## 2013-10-28 DIAGNOSIS — C3410 Malignant neoplasm of upper lobe, unspecified bronchus or lung: Secondary | ICD-10-CM | POA: Diagnosis not present

## 2013-10-28 NOTE — Telephone Encounter (Signed)
Gave pt appt for Radiation on 11/03/13, also gave pt appt calendar for April 2015 lab,md and CT , gave pt oral contrast for ct

## 2013-10-28 NOTE — Patient Instructions (Signed)
Referral to Dr. Pablo Ledger for palliative radiotherapy of the right upper lobe lung nodule. Followup visit in 3 months with repeat CT scan of the chest, abdomen and pelvis.

## 2013-10-28 NOTE — Progress Notes (Signed)
[place] [phone]   [phone]  OFFICE PROGRESS NOTE  Destiny Shelter, MD No address on file  DIAGNOSIS: Metastatic non-small cell lung cancer, squamous cell carcinoma diagnosed in March of 2013.   PRIOR THERAPY:  1. Status post palliative radiotherapy to the left lung mass under the care of Dr. Pablo Ledger completed on 03/06/2012.  2. Systemic chemotherapy with carboplatin for AUC of 6 on day 1 and Abraxane 100 mg/M2 on days 1, 8 and 15 every 3 weeks. Status post 2 cycles. From cycle 3 forward AUC will be decreased to 4.5 given on day 1 and the Abraxane will be decreased to 90 mg per meter squared on days 1, 8 and 15 every 3 weeks, Status post a total of 3 cycles.  3. Systemic chemotherapy with carboplatin for AUC of 5 on day 1 and gemcitabine 1000 mg/m2 given on day 1 and day 8 every 3 weeks,status post 1 cycle. Due to significant neutropenia she will be dosed reduced beginning cycle 2 forward to carboplatin at an AUC of 4 given on day 1 and gemcitabine at 800 mg per meter squared given on days 1 and 8 every 3 weeks. Status post 6 cycles.   CURRENT THERAPY: Observation.  CHEMOTHERAPY INTENT: Palliative  CURRENT # OF CHEMOTHERAPY CYCLES: 0  CURRENT ANTIEMETICS: Compazine  CURRENT SMOKING STATUS: Former smoker  ORAL CHEMOTHERAPY AND CONSENT: None  CURRENT BISPHOSPHONATES USE: None  PAIN MANAGEMENT: 5/10 right shoulder currently on Norco  NARCOTICS INDUCED CONSTIPATION: None  LIVING WILL AND CODE STATUS: No CODE BLUE   INTERVAL HISTORY: Destiny Harrison 73 y.o. female returns to the clinic today for three-month followup visit accompanied by her son. 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 She is currently on Vicodin 5/325 mg by mouth every 4 hours as needed for pain.She denied having any significant weight loss or night sweats. She has no chest pain,  shortness of breath, cough or hemoptysis. The patient denied having any significant fever or chills, nausea or vomiting. She had repeat CT scan of the chest, abdomen and pelvis performed recently and she is here for evaluation and discussion of her scan results.  MEDICAL HISTORY: Past Medical History  Diagnosis Date  . COPD (chronic obstructive pulmonary disease)   . Neutropenia, drug-induced 05/05/2012  . Hyperlipidemia   . Rheumatoid arthritis(714.0)   . Asthma   . Hiatal hernia   . Non-small cell lung cancer dx'd 08/28/11    left lung  . History of chemotherapy   . History of radiation therapy 03/06/2012    left hilum    ALLERGIES:  has No Known Allergies.  MEDICATIONS:  Current Outpatient Prescriptions  Medication Sig Dispense Refill  . albuterol (PROVENTIL HFA;VENTOLIN HFA) 108 (90 BASE) MCG/ACT inhaler Inhale 2 puffs into the lungs every 3 (three) hours as needed. WHEEZING AND SHORTNESS OF BREATH  1 Inhaler  5  . albuterol (PROVENTIL) (2.5 MG/3ML) 0.083% nebulizer solution 1 vial in neb four times daily with Ipratropium.  May add 1 vial every 3 hours as needed for wheezing/shortness of breath. DX 496  360 mL  3  . calcium-vitamin D (OSCAL WITH D) 500-200 MG-UNIT per tablet Take 1 tablet by mouth daily.      . folic acid (FOLVITE) 314 MCG tablet Take 400 mcg by mouth daily.      Marland Kitchen HYDROcodone-acetaminophen (NORCO/VICODIN) 5-325 MG per tablet Take  1 tablet by mouth every 4 (four) hours as needed.  60 tablet  0  . ipratropium (ATROVENT) 0.02 % nebulizer solution 1 vial in neb four times daily with Albuterol      . predniSONE (DELTASONE) 5 MG tablet Take 5 mg by mouth daily.       . simvastatin (ZOCOR) 5 MG tablet Take 5 mg by mouth at bedtime.       No current facility-administered medications for this visit.    SURGICAL HISTORY:  Past Surgical History  Procedure Laterality Date  . Appendex  1962  . Video bronchoscopy  01/28/2012    Procedure: VIDEO BRONCHOSCOPY WITHOUT FLUORO;   Surgeon: Brand Males, MD;  Location: [facility] ENDOSCOPY;  Service: Endoscopy;  Laterality: Bilateral;  . Surgery on right wrist      REVIEW OF SYSTEMS:  A comprehensive review of systems was negative except for: Musculoskeletal: positive for back pain   PHYSICAL EXAMINATION: General appearance: alert, cooperative and no distress Head: Normocephalic, without obvious abnormality, atraumatic Neck: no adenopathy, no JVD, supple, symmetrical, trachea midline and thyroid not enlarged, symmetric, no tenderness/mass/nodules Lymph nodes: Cervical, supraclavicular, and axillary nodes normal. Resp: clear to auscultation bilaterally Back: symmetric, no curvature. ROM normal. No CVA tenderness. Cardio: regular rate and rhythm, S1, S2 normal, no murmur, click, rub or gallop GI: soft, non-tender; bowel sounds normal; no masses,  no organomegaly Extremities: extremities normal, atraumatic, no cyanosis or edema  ECOG PERFORMANCE STATUS: 1 - Symptomatic but completely ambulatory  Blood pressure 126/64, pulse 93, temperature 98 F (36.7 C), temperature source Oral, resp. rate 17, height 5\' 5"  (1.651 m), weight 138 lb 8 oz (62.823 kg), SpO2 99.00%.  LABORATORY DATA: Lab Results  Component Value Date   WBC 5.1 10/26/2013   HGB 11.3* 10/26/2013   HCT 33.4* 10/26/2013   MCV 79.3* 10/26/2013   PLT 306 10/26/2013      Chemistry      Component Value Date/Time   NA 139 10/26/2013 0818   NA 140 10/12/2012 0520   K 4.3 10/26/2013 0818   K 3.8 10/12/2012 0520   CL 101 01/12/2013 0810   CL 104 10/12/2012 0520   CO2 27 10/26/2013 0818   CO2 29 10/12/2012 0520   BUN 13.7 10/26/2013 0818   BUN 6 10/12/2012 0520   CREATININE 0.9 10/26/2013 0818   CREATININE 0.71 10/12/2012 0520      Component Value Date/Time   CALCIUM 9.6 10/26/2013 0818   CALCIUM 8.9 10/12/2012 0520   ALKPHOS 93 10/26/2013 0818   ALKPHOS 75 10/09/2012 1252   AST 16 10/26/2013 0818   AST 20 10/09/2012 1252   ALT 11 10/26/2013 0818   ALT 12 10/09/2012 1252    BILITOT 0.32 10/26/2013 0818   BILITOT 0.4 10/09/2012 1252       RADIOGRAPHIC STUDIES: Ct Chest W Contrast  10/26/2013   CLINICAL DATA:  Lung cancer with bone metastases diagnosed in 2012. Chemotherapy now complete.  EXAM: CT CHEST, ABDOMEN AND PELVIS WITH CONTRAST  TECHNIQUE: Multidetector CT imaging of the chest was performed during intravenous contrast administration.  CONTRAST:  119mL OMNIPAQUE IOHEXOL 300 MG/ML  SOLN  COMPARISON:  CT of the chest, abdomen and pelvis 07/20/2013.  FINDINGS:   CT CHEST FINDINGS  Mediastinum: Again noted is noted is an enlarged high right paratracheal lymph node measuring 2.6 x 1.4 cm. Several other smaller partially calcified right paratracheal lymph nodes are noted. Heart size is normal. There is no significant pericardial fluid, thickening or pericardial  calcification. There is atherosclerosis of the thoracic aorta, the great vessels of the mediastinum and the coronary arteries, including calcified atherosclerotic plaque in the left main and left anterior descending coronary arteries. Small hiatal hernia.  Lungs/Pleura: Continued increase in size of irregular-shaped nodule in the central aspect of the right middle lobe (previously described as right upper lobe), which currently measures 2.1 x 1.8 cm (image 27 of series 2), previously only 1.9 x 1.0 cm. This right middle lobe nodule has macrolobulated and spiculated margins, and comes in contact with the minor fissure and major fissure both superiorly and posteriorly, best demonstrated on sagittal image 34 of series 603. No other definite new suspicious appearing pulmonary nodules or masses are otherwise noted. Chronic architectural distortion and volume loss in the medial aspect of the left lung, predominantly involving the left upper lobe, similar to prior examinations, compatible with chronic post radiation changes. No acute consolidative airspace disease. No pleural effusions. Background of moderate centrilobular  emphysema. Mild diffuse bronchial wall thickening.  Musculoskeletal: There are no aggressive appearing lytic or blastic lesions noted in the visualized portions of the skeleton.    CT ABDOMEN AND PELVIS FINDINGS  Abdomen/Pelvis: 11 x 7 mm hypervascular lesion in segment 4B of the liver is unchanged compared to prior examinations, favored to represent a benign lesion such is a flash fill cavernous hemangioma. No new hepatic lesions are otherwise noted. The appearance of the gallbladder, pancreas, spleen, bilateral adrenal glands and bilateral kidneys is unremarkable. No significant volume of ascites. No pneumoperitoneum. No pathologic distention of small bowel. No definite lymphadenopathy identified within the abdomen or pelvis. Atherosclerosis throughout the abdominal and pelvic vasculature, without evidence of aneurysm or dissection. Small calcified fibroid in the fundus of the uterus. The uterus and ovaries are atrophic, but otherwise unremarkable in appearance. Urinary bladder is nearly completely decompressed, but otherwise unremarkable in appearance.  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 There are no aggressive appearing lytic or blastic lesions noted in the visualized portions of the skeleton.   IMPRESSION: 1. Enlargement of highly suspicious appearing right middle lobe nodule which currently measures 2.1 x 1.8 cm. Previously described right paratracheal adenopathy is similar, measuring approximately 2.6 x 1.4 cm. While the paratracheal adenopathy is similar to prior examinations, the right middle lobe nodule is concerning for a 2nd primary bronchogenic carcinoma, and correlation with PET-CT or biopsy is suggested at this time. 2. No definite signs of metastatic disease in the abdomen or pelvis. 3. Chronic changes in the lungs  suggestive of COPD redemonstrated. 4. Atherosclerosis, including 2 vessel coronary artery disease. Assessment for potential risk factor modification, dietary therapy or pharmacologic therapy may be warranted, if clinically indicated. 5. Additional incidental findings, as above, similar to prior studies. These results will be called to the ordering clinician or representative by the Radiologist Assistant, and communication documented in the PACS Dashboard.   Electronically Signed   By: Vinnie Langton M.D.   On: 10/26/2013 10:50    ASSESSMENT AND PLAN: This is a very pleasant 73 years old [place] American female with metastatic non-small cell lung cancer, squamous cell carcinoma status post several chemotherapy regimen and currently on observation. The patient has no significant evidence for disease progression except for another mild increase in the central right upper lobe nodule. I discussed the scan results with the patient and her husband I recommended for her to  see Dr. Pablo Ledger for evaluation and consideration of palliative radiotherapy to this area.  She would have repeat CT scan of the chest, abdomen and pelvis in 3 months and she would come back for followup visit at that time. She was advised to call immediately if she has any concerning symptoms in the interval.  The patient voices understanding of current disease status and treatment options and is in agreement with the current care plan.  All questions were answered. The patient knows to call the clinic with any problems, questions or concerns. We can certainly see the patient much sooner if necessary.

## 2013-11-01 ENCOUNTER — Encounter: Payer: Self-pay | Admitting: Radiation Oncology

## 2013-11-01 NOTE — Progress Notes (Signed)
Histology and Location of Primary Cancer: non small cell squamous cell carcinoma - lung left upper lobe   Location(s) of Symptomatic tumor(s): .Enlargement of highly suspicious appearing right middle lobe nodule which currently measures 2.1 x 1.8 cm. Previously described right paratracheal adenopathy is similar, measuring approximately 2.6 x 1.4 cm. While the paratracheal adenopathy is similar to prior examinations, the right middle lobe nodule is concerning for a 2nd primary bronchogenic carcinoma, and correlation with PET-CT or biopsy is suggested at this time    Past/Anticipated chemotherapy by medical oncology, if any: Systemic chemotherapy with carboplatin for AUC of 6 on day 1 and Abraxane 100 mg/M2 on days 1, 8 and 15 every 3 weeks. Status post 2 cycles. From cycle 3 forward AUC will be decreased to 4.5 given on day 1 and the Abraxane will be decreased to 90 mg per meter squared on days 1, 8 and 15 every 3 weeks, Status post a total of 3 cycles. Systemic chemotherapy with carboplatin for AUC of 5 on day 1 and gemcitabine 1000 mg/m2 given on day 1 and day 8 every 3 weeks,status post 1 cycle. Due to significant neutropenia she will be dosed reduced beginning cycle 2 forward to carboplatin at an AUC of 4 given on day 1 and gemcitabine at 800 mg per meter squared given on days 1 and 8 every 3 weeks. Status post 6 cycles.   Patient's main complaints related to symptomatic tumor(s) are:   Pain on a scale of 0-10 is: 2 "back" Ambulatory status: ambulatory   SAFETY ISSUES:  Prior radiation? Yes - 30 gray to left hilum 02/24/2012-03/06/2012 and 05/10/13-05/31/13 15 fx's to left lung. Pacemaker/ICD? no  Possible current pregnancy? no  Is the patient on methotrexate? No  Additional Complaints / other details:

## 2013-11-03 ENCOUNTER — Ambulatory Visit
Admission: RE | Admit: 2013-11-03 | Discharge: 2013-11-03 | Disposition: A | Discharge status: Home / Self Care | Payer: Medicare Other | Source: Ambulatory Visit | Attending: Radiation Oncology | Admitting: Radiation Oncology

## 2013-11-03 VITALS — BP 127/74 | HR 93 | Temp 98.6°F | Resp 20 | Wt 139.4 lb

## 2013-11-03 DIAGNOSIS — R059 Cough, unspecified: Secondary | ICD-10-CM | POA: Diagnosis not present

## 2013-11-03 DIAGNOSIS — C3400 Malignant neoplasm of unspecified main bronchus: Secondary | ICD-10-CM | POA: Insufficient documentation

## 2013-11-03 DIAGNOSIS — Z79899 Other long term (current) drug therapy: Secondary | ICD-10-CM | POA: Diagnosis not present

## 2013-11-03 DIAGNOSIS — R222 Localized swelling, mass and lump, trunk: Secondary | ICD-10-CM

## 2013-11-03 DIAGNOSIS — R05 Cough: Secondary | ICD-10-CM | POA: Insufficient documentation

## 2013-11-03 DIAGNOSIS — C3490 Malignant neoplasm of unspecified part of unspecified bronchus or lung: Secondary | ICD-10-CM

## 2013-11-03 DIAGNOSIS — Z923 Personal history of irradiation: Secondary | ICD-10-CM | POA: Insufficient documentation

## 2013-11-03 DIAGNOSIS — C342 Malignant neoplasm of middle lobe, bronchus or lung: Secondary | ICD-10-CM | POA: Diagnosis not present

## 2013-11-03 DIAGNOSIS — R911 Solitary pulmonary nodule: Secondary | ICD-10-CM | POA: Insufficient documentation

## 2013-11-03 NOTE — Progress Notes (Signed)
Please see the Nurse Progress Note in the MD Initial Consult Encounter for this patient. 

## 2013-11-04 ENCOUNTER — Ambulatory Visit
Admission: RE | Admit: 2013-11-04 | Discharge: 2013-11-04 | Disposition: A | Discharge status: Home / Self Care | Payer: Medicare Other | Source: Ambulatory Visit | Attending: Radiation Oncology | Admitting: Radiation Oncology

## 2013-11-04 DIAGNOSIS — C3490 Malignant neoplasm of unspecified part of unspecified bronchus or lung: Secondary | ICD-10-CM | POA: Diagnosis not present

## 2013-11-04 DIAGNOSIS — Z510 Encounter for antineoplastic radiation therapy: Secondary | ICD-10-CM | POA: Insufficient documentation

## 2013-11-04 NOTE — Progress Notes (Signed)
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   She has been doing well. She unfortunately continues to cough. She had a CT of the chest on January 6 which showed a nodule in the right middle lobe now measuring 2.1 x 1.8 cm previously measuring 1.9 x 1.0 cm. The high right paratracheal node was stable to decreased in size after being treated previously. She has no hemoptysis or headache. She continues to care for her son who is on dialysis and is on the transplant list. She also is caring for her husband who is undergoing prostate biopsy of this week.  Allergies: No Known Allergies  Medications:  Current Outpatient Prescriptions  Medication Sig Dispense Refill  . albuterol (PROVENTIL HFA;VENTOLIN HFA) 108 (90 BASE) MCG/ACT inhaler Inhale 2 puffs into the lungs every 3 (three) hours as needed. WHEEZING AND SHORTNESS OF BREATH  1 Inhaler  5  . albuterol (PROVENTIL) (2.5 MG/3ML) 0.083% nebulizer solution 1 vial in neb four times daily with Ipratropium.  May add 1 vial every 3 hours as needed for wheezing/shortness of breath. DX 496  360 mL  3  . calcium-vitamin D (OSCAL WITH D) 500-200 MG-UNIT per tablet Take 1 tablet by mouth daily.      . folic acid (FOLVITE) 657 MCG tablet Take 400 mcg by mouth daily.      Marland Kitchen HYDROcodone-acetaminophen (NORCO/VICODIN) 5-325 MG per tablet Take 1 tablet by mouth every 4 (four) hours as needed.  60 tablet  0  . ipratropium (ATROVENT) 0.02 % nebulizer solution 1 vial in neb four times daily with Albuterol      . naproxen  sodium (ANAPROX) 220 MG tablet Take 220 mg by mouth.      . predniSONE (DELTASONE) 5 MG tablet Take 5 mg by mouth daily.       . simvastatin (ZOCOR) 5 MG tablet Take 5 mg by mouth at bedtime.       No current facility-administered medications for this encounter.    Physical Exam:  Filed Vitals:   11/03/13 1516  BP: 127/74  Pulse: 93  Temp: 98.6 F (37 C)  Resp: 20  Weight: 139 lb 6.4 oz (63.231 kg)  SpO2: 99%   she is a pleasant female in no distress sitting comfortably examining table. She has no enlarged right cervical or supraclavicular lymph nodes. She is not on oxygen.  IMPRESSION: Destiny Harrison is a 73 y.o. female status post palliative radiation with an enlarging right hilar mass and no other progressive metastatic disease  PLAN:  I feel you would benefit from palliative radiation to this right hilar lesion. It's really the only area that growing. We can try to retreat her previously radiated fields and see what dose we can deliver to this area. I discussed with her her scans and with those images together. I also shared her her previous radiation doses and the proximity of this area to her previous left left hilar treatment. As is his retreatment I think she would benefit from I MRT and that is  what I requested. We'll also have to perform a composite plan to make sure that we are respecting the previously given radiation. We discussed the process of simulation the placement tattoos of course she is familiar with this. We discussed lung heart and spinal cord damage. I discussed the low likelihood of symptomatic esophagitis as we are likely not going to be close to the esophagus with this treatment. She has signed informed consent and agree to proceed forward. I schedule her for simulation tomorrow.    Thea Silversmith, MD

## 2013-11-04 NOTE — Progress Notes (Signed)
[facility] Simulation and Treatment Planning Note   Name: Destiny Harrison MRN: [id number]  Date: 11/04/2013  DOB: [birth year]  Status: outpatient    DIAGNOSIS: Locally progressive right lung cancer (right hilum) with previous treatment to right paratracheal lymph node and left hilum.     SIDE: right   CONSENT VERIFIED: yes   SET UP AND IMMOBILIZATION: Patient is setup supine with arms in a wing board.   NARRATIVE: The patient was brought to the [facility].  Identity was confirmed.  All relevant records and images related to the planned course of therapy were reviewed.  Then, the patient was positioned in a stable reproducible clinical set-up for radiation therapy.  CT images were obtained.  Skin markings were placed.  The CT images were loaded into the planning software where the target and avoidance structures were contoured.  The radiation prescription was entered and confirmed.   TREATMENT PLANNING NOTE:  Treatment planning then occurred. I have requested IMRT DVH of the spinal cord, total lungs and gross tumor volume. I have also requested an IMRT treatment device and an isodose plan.  IMRT will be helpful in this situation given her previous treatment to the lung (twice) and to avoid overlap with the previously radiated fields.   Special treatment procedure will be performed as Destiny Harrison will be receiving radiation in very close proximity to a previously radiated area.  A composite plan will be formulated. I have discussed with her the risks of retreatment.

## 2013-11-05 NOTE — Addendum Note (Signed)
Encounter addended by: Deirdre Evener, RN on: 11/05/2013  4:58 PM<BR>     Documentation filed: Charges VN

## 2013-11-09 ENCOUNTER — Other Ambulatory Visit: Payer: Self-pay | Admitting: Radiation Oncology

## 2013-11-10 DIAGNOSIS — Z510 Encounter for antineoplastic radiation therapy: Secondary | ICD-10-CM | POA: Diagnosis not present

## 2013-11-10 DIAGNOSIS — C3490 Malignant neoplasm of unspecified part of unspecified bronchus or lung: Secondary | ICD-10-CM | POA: Diagnosis not present

## 2013-11-11 DIAGNOSIS — C3490 Malignant neoplasm of unspecified part of unspecified bronchus or lung: Secondary | ICD-10-CM | POA: Diagnosis not present

## 2013-11-11 DIAGNOSIS — Z510 Encounter for antineoplastic radiation therapy: Secondary | ICD-10-CM | POA: Diagnosis not present

## 2013-11-15 ENCOUNTER — Ambulatory Visit
Admission: RE | Admit: 2013-11-15 | Discharge: 2013-11-15 | Disposition: A | Discharge status: Home / Self Care | Payer: Medicare Other | Source: Ambulatory Visit | Attending: Radiation Oncology | Admitting: Radiation Oncology

## 2013-11-15 DIAGNOSIS — C3490 Malignant neoplasm of unspecified part of unspecified bronchus or lung: Secondary | ICD-10-CM | POA: Diagnosis not present

## 2013-11-15 DIAGNOSIS — Z510 Encounter for antineoplastic radiation therapy: Secondary | ICD-10-CM | POA: Diagnosis not present

## 2013-11-16 ENCOUNTER — Ambulatory Visit: Payer: Medicare Other | Admitting: Radiation Oncology

## 2013-11-16 ENCOUNTER — Ambulatory Visit
Admission: RE | Admit: 2013-11-16 | Discharge: 2013-11-16 | Disposition: A | Discharge status: Home / Self Care | Payer: Medicare Other | Source: Ambulatory Visit | Attending: Radiation Oncology | Admitting: Radiation Oncology

## 2013-11-16 DIAGNOSIS — C3490 Malignant neoplasm of unspecified part of unspecified bronchus or lung: Secondary | ICD-10-CM | POA: Diagnosis not present

## 2013-11-16 DIAGNOSIS — Z510 Encounter for antineoplastic radiation therapy: Secondary | ICD-10-CM | POA: Diagnosis not present

## 2013-11-17 ENCOUNTER — Encounter: Payer: Self-pay | Admitting: Radiation Oncology

## 2013-11-17 ENCOUNTER — Ambulatory Visit
Admission: RE | Admit: 2013-11-17 | Discharge: 2013-11-17 | Disposition: A | Discharge status: Home / Self Care | Payer: Medicare Other | Source: Ambulatory Visit | Attending: Radiation Oncology | Admitting: Radiation Oncology

## 2013-11-17 VITALS — BP 116/75 | HR 103 | Resp 18 | Wt 137.8 lb

## 2013-11-17 DIAGNOSIS — Z510 Encounter for antineoplastic radiation therapy: Secondary | ICD-10-CM | POA: Diagnosis not present

## 2013-11-17 DIAGNOSIS — C3490 Malignant neoplasm of unspecified part of unspecified bronchus or lung: Secondary | ICD-10-CM

## 2013-11-17 NOTE — Progress Notes (Signed)
Patient reports dry cough continues. Reports shortness of breath. Denies difficulty swallowing. Reports fatigue but, that she is able to push through. Remains active and positive.

## 2013-11-18 ENCOUNTER — Ambulatory Visit
Admission: RE | Admit: 2013-11-18 | Discharge: 2013-11-18 | Disposition: A | Discharge status: Home / Self Care | Payer: Medicare Other | Source: Ambulatory Visit | Attending: Radiation Oncology | Admitting: Radiation Oncology

## 2013-11-18 DIAGNOSIS — C3490 Malignant neoplasm of unspecified part of unspecified bronchus or lung: Secondary | ICD-10-CM | POA: Diagnosis not present

## 2013-11-18 DIAGNOSIS — Z510 Encounter for antineoplastic radiation therapy: Secondary | ICD-10-CM | POA: Diagnosis not present

## 2013-11-18 NOTE — Progress Notes (Signed)
Weekly Management Note (seen on 1/28) Current Dose:  9 Gy  Projected Dose: 30 Gy   Narrative:  The patient presents for routine under treatment assessment.  CBCT/MVCT images/Port film x-rays were reviewed.  The chart was checked. Doing well. Cough stable. No shortness of breath or fever  Physical Findings: Weight: 137 lb 12.8 oz (62.506 kg). Unchanged  Impression:  The patient is tolerating radiation.  Plan:  Continue treatment as planned.

## 2013-11-19 ENCOUNTER — Ambulatory Visit
Admission: RE | Admit: 2013-11-19 | Discharge: 2013-11-19 | Disposition: A | Discharge status: Home / Self Care | Payer: Medicare Other | Source: Ambulatory Visit | Attending: Radiation Oncology | Admitting: Radiation Oncology

## 2013-11-19 DIAGNOSIS — Z510 Encounter for antineoplastic radiation therapy: Secondary | ICD-10-CM | POA: Diagnosis not present

## 2013-11-19 DIAGNOSIS — C3490 Malignant neoplasm of unspecified part of unspecified bronchus or lung: Secondary | ICD-10-CM | POA: Diagnosis not present

## 2013-11-22 ENCOUNTER — Ambulatory Visit
Admission: RE | Admit: 2013-11-22 | Discharge: 2013-11-22 | Disposition: A | Discharge status: Home / Self Care | Payer: Medicare Other | Source: Ambulatory Visit | Attending: Radiation Oncology | Admitting: Radiation Oncology

## 2013-11-22 DIAGNOSIS — Z510 Encounter for antineoplastic radiation therapy: Secondary | ICD-10-CM | POA: Diagnosis not present

## 2013-11-22 DIAGNOSIS — C3490 Malignant neoplasm of unspecified part of unspecified bronchus or lung: Secondary | ICD-10-CM | POA: Diagnosis not present

## 2013-11-23 ENCOUNTER — Ambulatory Visit
Admission: RE | Admit: 2013-11-23 | Discharge: 2013-11-23 | Disposition: A | Discharge status: Home / Self Care | Payer: Medicare Other | Source: Ambulatory Visit | Attending: Radiation Oncology | Admitting: Radiation Oncology

## 2013-11-23 VITALS — BP 130/71 | HR 102 | Temp 98.4°F | Ht 65.0 in | Wt 138.8 lb

## 2013-11-23 DIAGNOSIS — C3490 Malignant neoplasm of unspecified part of unspecified bronchus or lung: Secondary | ICD-10-CM

## 2013-11-23 DIAGNOSIS — Z510 Encounter for antineoplastic radiation therapy: Secondary | ICD-10-CM | POA: Diagnosis not present

## 2013-11-23 MED ORDER — RADIAPLEXRX EX GEL
Freq: Once | CUTANEOUS | Status: AC
Start: 2013-11-23 — End: 2013-11-23
  Administered 2013-11-23: 18:00:00 via TOPICAL

## 2013-11-23 NOTE — Progress Notes (Addendum)
Destiny Harrison has had 7 fractions to her right hilum.  She denies pain today.  She denies an increase in shortness of breath.  Oxygen sat was 100% on room air.  She has a dry, frequent cough that she says is not worse.  She denies a sore throat and fatigue.  Currently taking prednisone 5 mg daily.  She has an area of hyperpigmentation on her right side.    Reviewed post sim education.  She was given radiaplex gel.

## 2013-11-23 NOTE — Addendum Note (Signed)
Encounter addended by: Jacqulyn Liner, RN on: 11/23/2013  5:57 PM<BR>     Documentation filed: Inpatient MAR

## 2013-11-23 NOTE — Addendum Note (Signed)
Encounter addended by: Jacqulyn Liner, RN on: 11/23/2013  5:43 PM<BR>     Documentation filed: Orders

## 2013-11-23 NOTE — Progress Notes (Signed)
Weekly Management Note Current Dose:21 Gy  Projected Dose:30 Gy   Narrative:  The patient presents for routine under treatment assessment.  CBCT/MVCT images/Port film x-rays were reviewed.  The chart was checked. Doing well. Cough stable. Scan scheduled by [facility] in April   Physical Findings:  No skin changes. Some dryness under right arm.  Vitals:  Filed Vitals:   11/23/13 1227  BP: 130/71  Pulse: 102  Temp: 98.4 F (36.9 C)   Weight:  Wt Readings from Last 3 Encounters:  11/23/13 138 lb 12.8 oz (62.959 kg)  11/17/13 137 lb 12.8 oz (62.506 kg)  11/03/13 139 lb 6.4 oz (63.231 kg)   Lab Results  Component Value Date   WBC 5.1 10/26/2013   HGB 11.3* 10/26/2013   HCT 33.4* 10/26/2013   MCV 79.3* 10/26/2013   PLT 306 10/26/2013   Lab Results  Component Value Date   CREATININE 0.9 10/26/2013   BUN 13.7 10/26/2013   NA 139 10/26/2013   K 4.3 10/26/2013   CL 101 01/12/2013   CO2 27 10/26/2013     Impression:  The patient is tolerating radiation.  Plan:  Continue treatment as planned. Follow up in 1 month.

## 2013-11-24 ENCOUNTER — Ambulatory Visit
Admission: RE | Admit: 2013-11-24 | Discharge: 2013-11-24 | Disposition: A | Discharge status: Home / Self Care | Payer: Medicare Other | Source: Ambulatory Visit | Attending: Radiation Oncology | Admitting: Radiation Oncology

## 2013-11-24 DIAGNOSIS — C3490 Malignant neoplasm of unspecified part of unspecified bronchus or lung: Secondary | ICD-10-CM | POA: Diagnosis not present

## 2013-11-24 DIAGNOSIS — Z510 Encounter for antineoplastic radiation therapy: Secondary | ICD-10-CM | POA: Diagnosis not present

## 2013-11-25 ENCOUNTER — Ambulatory Visit
Admission: RE | Admit: 2013-11-25 | Discharge: 2013-11-25 | Disposition: A | Discharge status: Home / Self Care | Payer: Medicare Other | Source: Ambulatory Visit | Attending: Radiation Oncology | Admitting: Radiation Oncology

## 2013-11-25 DIAGNOSIS — Z510 Encounter for antineoplastic radiation therapy: Secondary | ICD-10-CM | POA: Diagnosis not present

## 2013-11-25 DIAGNOSIS — C3490 Malignant neoplasm of unspecified part of unspecified bronchus or lung: Secondary | ICD-10-CM | POA: Diagnosis not present

## 2013-11-26 ENCOUNTER — Encounter: Payer: Self-pay | Admitting: Radiation Oncology

## 2013-11-26 ENCOUNTER — Ambulatory Visit
Admission: RE | Admit: 2013-11-26 | Discharge: 2013-11-26 | Disposition: A | Discharge status: Home / Self Care | Payer: Medicare Other | Source: Ambulatory Visit | Attending: Radiation Oncology | Admitting: Radiation Oncology

## 2013-11-26 DIAGNOSIS — C3490 Malignant neoplasm of unspecified part of unspecified bronchus or lung: Secondary | ICD-10-CM | POA: Diagnosis not present

## 2013-11-26 DIAGNOSIS — Z510 Encounter for antineoplastic radiation therapy: Secondary | ICD-10-CM | POA: Diagnosis not present

## 2013-11-28 NOTE — Progress Notes (Signed)
  Radiation Oncology         [phone]) [phone] ________________________________  Name: Destiny Harrison MRN: [id number]  Date: 11/26/2013  DOB: [birth year]  End of Treatment Note  Diagnosis:   Recurrent NSCLC of the right hilum     Indication for treatment:  Palliative       Radiation treatment dates:  11/15/2013-11/26/2013  Site/dose:   Right hilum / 30 Gy in 10 fractions at 3 Gy per fraction  Beams/energy:   Helical tomotherapy was used to deliver IMRT with 6 MV photons.   Narrative: The patient tolerated radiation treatment relatively well.   She had continued cough and stable shortness of breath.   Plan: The patient has completed radiation treatment. The patient will return to radiation oncology clinic for routine followup in one month. I advised them to call or return sooner if they have any questions or concerns related to their recovery or treatment.  ------------------------------------------------  Thea Silversmith, MD

## 2013-11-29 ENCOUNTER — Ambulatory Visit: Payer: Medicare Other

## 2013-11-30 ENCOUNTER — Ambulatory Visit: Payer: Medicare Other

## 2013-12-01 ENCOUNTER — Ambulatory Visit: Payer: Medicare Other

## 2013-12-02 ENCOUNTER — Ambulatory Visit: Payer: Medicare Other

## 2013-12-03 ENCOUNTER — Ambulatory Visit: Payer: Medicare Other

## 2013-12-29 ENCOUNTER — Emergency Department (HOSPITAL_COMMUNITY): Payer: Medicare Other

## 2013-12-29 ENCOUNTER — Inpatient Hospital Stay (HOSPITAL_COMMUNITY)
Admission: EM | Admit: 2013-12-29 | Discharge: 2013-12-30 | DRG: 190 | Disposition: A | Discharge status: Home / Self Care | Payer: Medicare Other | Attending: Internal Medicine | Admitting: Internal Medicine

## 2013-12-29 ENCOUNTER — Encounter (HOSPITAL_COMMUNITY): Payer: Self-pay | Admitting: Emergency Medicine

## 2013-12-29 ENCOUNTER — Inpatient Hospital Stay (HOSPITAL_COMMUNITY): Payer: Medicare Other

## 2013-12-29 DIAGNOSIS — C3490 Malignant neoplasm of unspecified part of unspecified bronchus or lung: Secondary | ICD-10-CM | POA: Diagnosis present

## 2013-12-29 DIAGNOSIS — R0602 Shortness of breath: Secondary | ICD-10-CM | POA: Diagnosis not present

## 2013-12-29 DIAGNOSIS — J189 Pneumonia, unspecified organism: Secondary | ICD-10-CM | POA: Diagnosis not present

## 2013-12-29 DIAGNOSIS — J9600 Acute respiratory failure, unspecified whether with hypoxia or hypercapnia: Secondary | ICD-10-CM | POA: Diagnosis present

## 2013-12-29 DIAGNOSIS — Z923 Personal history of irradiation: Secondary | ICD-10-CM

## 2013-12-29 DIAGNOSIS — Z87891 Personal history of nicotine dependence: Secondary | ICD-10-CM

## 2013-12-29 DIAGNOSIS — J438 Other emphysema: Secondary | ICD-10-CM | POA: Diagnosis not present

## 2013-12-29 DIAGNOSIS — J449 Chronic obstructive pulmonary disease, unspecified: Secondary | ICD-10-CM

## 2013-12-29 DIAGNOSIS — Z833 Family history of diabetes mellitus: Secondary | ICD-10-CM

## 2013-12-29 DIAGNOSIS — Z9221 Personal history of antineoplastic chemotherapy: Secondary | ICD-10-CM

## 2013-12-29 DIAGNOSIS — M069 Rheumatoid arthritis, unspecified: Secondary | ICD-10-CM | POA: Diagnosis present

## 2013-12-29 DIAGNOSIS — I498 Other specified cardiac arrhythmias: Secondary | ICD-10-CM | POA: Diagnosis present

## 2013-12-29 DIAGNOSIS — IMO0002 Reserved for concepts with insufficient information to code with codable children: Secondary | ICD-10-CM

## 2013-12-29 DIAGNOSIS — J441 Chronic obstructive pulmonary disease with (acute) exacerbation: Principal | ICD-10-CM | POA: Diagnosis present

## 2013-12-29 DIAGNOSIS — D630 Anemia in neoplastic disease: Secondary | ICD-10-CM | POA: Diagnosis present

## 2013-12-29 DIAGNOSIS — D649 Anemia, unspecified: Secondary | ICD-10-CM

## 2013-12-29 DIAGNOSIS — Z803 Family history of malignant neoplasm of breast: Secondary | ICD-10-CM | POA: Diagnosis not present

## 2013-12-29 DIAGNOSIS — R0600 Dyspnea, unspecified: Secondary | ICD-10-CM

## 2013-12-29 DIAGNOSIS — Z79899 Other long term (current) drug therapy: Secondary | ICD-10-CM | POA: Diagnosis not present

## 2013-12-29 DIAGNOSIS — E785 Hyperlipidemia, unspecified: Secondary | ICD-10-CM | POA: Diagnosis present

## 2013-12-29 DIAGNOSIS — R222 Localized swelling, mass and lump, trunk: Secondary | ICD-10-CM

## 2013-12-29 DIAGNOSIS — A084 Viral intestinal infection, unspecified: Secondary | ICD-10-CM

## 2013-12-29 DIAGNOSIS — C3411 Malignant neoplasm of upper lobe, right bronchus or lung: Secondary | ICD-10-CM | POA: Diagnosis present

## 2013-12-29 DIAGNOSIS — D702 Other drug-induced agranulocytosis: Secondary | ICD-10-CM

## 2013-12-29 DIAGNOSIS — R509 Fever, unspecified: Secondary | ICD-10-CM

## 2013-12-29 HISTORY — DX: Reserved for concepts with insufficient information to code with codable children: IMO0002

## 2013-12-29 HISTORY — DX: Reserved for inherently not codable concepts without codable children: IMO0001

## 2013-12-29 LAB — I-STAT TROPONIN, ED: TROPONIN I, POC: 0 ng/mL (ref 0.00–0.08)

## 2013-12-29 LAB — CBC
HCT: 29.2 % — ABNORMAL LOW (ref 36.0–46.0)
HEMATOCRIT: 28.6 % — AB (ref 36.0–46.0)
Hemoglobin: 9.5 g/dL — ABNORMAL LOW (ref 12.0–15.0)
Hemoglobin: 9.7 g/dL — ABNORMAL LOW (ref 12.0–15.0)
MCH: 25.1 pg — AB (ref 26.0–34.0)
MCH: 26.1 pg (ref 26.0–34.0)
MCHC: 32.5 g/dL (ref 30.0–36.0)
MCHC: 33.9 g/dL (ref 30.0–36.0)
MCV: 77 fL — ABNORMAL LOW (ref 78.0–100.0)
MCV: 77.1 fL — AB (ref 78.0–100.0)
PLATELETS: 364 10*3/uL (ref 150–400)
Platelets: 368 10*3/uL (ref 150–400)
RBC: 3.71 MIL/uL — ABNORMAL LOW (ref 3.87–5.11)
RBC: 3.79 MIL/uL — ABNORMAL LOW (ref 3.87–5.11)
RDW: 15 % (ref 11.5–15.5)
RDW: 15.1 % (ref 11.5–15.5)
WBC: 5.9 10*3/uL (ref 4.0–10.5)
WBC: 7.5 10*3/uL (ref 4.0–10.5)

## 2013-12-29 LAB — BASIC METABOLIC PANEL
BUN: 11 mg/dL (ref 6–23)
BUN: 11 mg/dL (ref 6–23)
CALCIUM: 9.4 mg/dL (ref 8.4–10.5)
CO2: 24 mEq/L (ref 19–32)
CO2: 25 mEq/L (ref 19–32)
CREATININE: 0.9 mg/dL (ref 0.50–1.10)
Calcium: 9.1 mg/dL (ref 8.4–10.5)
Chloride: 98 mEq/L (ref 96–112)
Chloride: 100 mEq/L (ref 96–112)
Creatinine, Ser: 0.78 mg/dL (ref 0.50–1.10)
GFR calc Af Amer: 71 mL/min — ABNORMAL LOW (ref >90)
GFR calc Af Amer: >90 mL/min (ref >90)
GFR, EST NON AFRICAN AMERICAN: 61 mL/min — AB (ref >90)
GFR, EST NON AFRICAN AMERICAN: 80 mL/min — AB (ref >90)
GLUCOSE: 159 mg/dL — AB (ref 70–99)
Glucose, Bld: 168 mg/dL — ABNORMAL HIGH (ref 70–99)
Potassium: 3.5 mEq/L — ABNORMAL LOW (ref 3.7–5.3)
Potassium: 3.8 mEq/L (ref 3.7–5.3)
Sodium: 136 mEq/L — ABNORMAL LOW (ref 137–147)
Sodium: 138 mEq/L (ref 137–147)

## 2013-12-29 LAB — BLOOD GAS, ARTERIAL
ACID-BASE EXCESS: 0 mmol/L (ref 0.0–2.0)
BICARBONATE: 24.1 meq/L — AB (ref 20.0–24.0)
Drawn by: 317871
O2 Content: 3 L/min
O2 Saturation: 96.6 %
PCO2 ART: 39.3 mmHg (ref 35.0–45.0)
PO2 ART: 94.8 mmHg (ref 80.0–100.0)
Patient temperature: 98.6
TCO2: 22.5 mmol/L (ref 0–100)
pH, Arterial: 7.405 (ref 7.350–7.450)

## 2013-12-29 LAB — TSH: TSH: 0.375 u[IU]/mL (ref 0.350–4.500)

## 2013-12-29 LAB — D-DIMER, QUANTITATIVE: D-Dimer, Quant: 3.75 ug/mL-FEU — ABNORMAL HIGH (ref 0.00–0.48)

## 2013-12-29 LAB — I-STAT CG4 LACTIC ACID, ED: Lactic Acid, Venous: 1.94 mmol/L (ref 0.5–2.2)

## 2013-12-29 LAB — PRO B NATRIURETIC PEPTIDE: Pro B Natriuretic peptide (BNP): 93.5 pg/mL (ref 0–125)

## 2013-12-29 MED ORDER — ACETAMINOPHEN 325 MG PO TABS
650.0000 mg | ORAL_TABLET | Freq: Four times a day (QID) | ORAL | Status: DC | PRN
  Administered 2013-12-29: 650 mg via ORAL
  Filled 2013-12-29: qty 2

## 2013-12-29 MED ORDER — BUDESONIDE 0.25 MG/2ML IN SUSP
0.2500 mg | Freq: Two times a day (BID) | RESPIRATORY_TRACT | Status: DC
  Administered 2013-12-29 – 2013-12-30 (×3): 0.25 mg via RESPIRATORY_TRACT
  Filled 2013-12-29 (×7): qty 2

## 2013-12-29 MED ORDER — METHYLPREDNISOLONE SODIUM SUCC 40 MG IJ SOLR
40.0000 mg | Freq: Every day | INTRAMUSCULAR | Status: DC
  Filled 2013-12-29: qty 1

## 2013-12-29 MED ORDER — ALBUTEROL SULFATE (2.5 MG/3ML) 0.083% IN NEBU
INHALATION_SOLUTION | RESPIRATORY_TRACT | Status: AC
  Filled 2013-12-29: qty 6

## 2013-12-29 MED ORDER — LEVALBUTEROL HCL 1.25 MG/0.5ML IN NEBU
1.2500 mg | INHALATION_SOLUTION | Freq: Three times a day (TID) | RESPIRATORY_TRACT | Status: DC
  Administered 2013-12-29 – 2013-12-30 (×2): 1.25 mg via RESPIRATORY_TRACT
  Filled 2013-12-29 (×5): qty 0.5

## 2013-12-29 MED ORDER — ONDANSETRON HCL 4 MG/2ML IJ SOLN: 4.0000 mg | Freq: Four times a day (QID) | INTRAMUSCULAR | Status: DC | PRN

## 2013-12-29 MED ORDER — IPRATROPIUM BROMIDE 0.02 % IN SOLN: 0.5000 mg | RESPIRATORY_TRACT | Status: DC

## 2013-12-29 MED ORDER — ALBUTEROL SULFATE (2.5 MG/3ML) 0.083% IN NEBU
5.0000 mg | INHALATION_SOLUTION | Freq: Once | RESPIRATORY_TRACT | Status: AC
Start: 2013-12-29 — End: 2013-12-29
  Administered 2013-12-29: 5 mg via RESPIRATORY_TRACT

## 2013-12-29 MED ORDER — ALBUTEROL SULFATE (2.5 MG/3ML) 0.083% IN NEBU: 2.5000 mg | INHALATION_SOLUTION | RESPIRATORY_TRACT | Status: DC | PRN

## 2013-12-29 MED ORDER — SODIUM CHLORIDE 0.9 % IJ SOLN
3.0000 mL | Freq: Two times a day (BID) | INTRAMUSCULAR | Status: DC
  Administered 2013-12-29 (×2): 3 mL via INTRAVENOUS

## 2013-12-29 MED ORDER — METHYLPREDNISOLONE SODIUM SUCC 125 MG IJ SOLR
125.0000 mg | Freq: Once | INTRAMUSCULAR | Status: AC
  Administered 2013-12-29: 125 mg via INTRAVENOUS
  Filled 2013-12-29: qty 2

## 2013-12-29 MED ORDER — LEVOFLOXACIN 500 MG PO TABS
500.0000 mg | ORAL_TABLET | Freq: Every day | ORAL | Status: DC
  Administered 2013-12-29 – 2013-12-30 (×2): 500 mg via ORAL
  Filled 2013-12-29 (×2): qty 1

## 2013-12-29 MED ORDER — PREDNISONE 50 MG PO TABS
60.0000 mg | ORAL_TABLET | Freq: Every day | ORAL | Status: DC
  Administered 2013-12-30: 60 mg via ORAL
  Filled 2013-12-29 (×2): qty 1

## 2013-12-29 MED ORDER — IPRATROPIUM BROMIDE 0.02 % IN SOLN: 0.5000 mg | Freq: Four times a day (QID) | RESPIRATORY_TRACT | Status: DC

## 2013-12-29 MED ORDER — IPRATROPIUM BROMIDE 0.02 % IN SOLN
0.5000 mg | Freq: Three times a day (TID) | RESPIRATORY_TRACT | Status: DC
  Administered 2013-12-29 – 2013-12-30 (×2): 0.5 mg via RESPIRATORY_TRACT
  Filled 2013-12-29 (×2): qty 2.5

## 2013-12-29 MED ORDER — POTASSIUM CHLORIDE CRYS ER 20 MEQ PO TBCR
40.0000 meq | EXTENDED_RELEASE_TABLET | Freq: Once | ORAL | Status: AC
  Administered 2013-12-29: 40 meq via ORAL
  Filled 2013-12-29: qty 2

## 2013-12-29 MED ORDER — ALBUTEROL SULFATE (2.5 MG/3ML) 0.083% IN NEBU: 2.5000 mg | INHALATION_SOLUTION | RESPIRATORY_TRACT | Status: DC

## 2013-12-29 MED ORDER — ACETAMINOPHEN 650 MG RE SUPP: 650.0000 mg | Freq: Four times a day (QID) | RECTAL | Status: DC | PRN

## 2013-12-29 MED ORDER — FOLIC ACID 1 MG PO TABS
1000.0000 ug | ORAL_TABLET | Freq: Every day | ORAL | Status: DC
  Administered 2013-12-29 – 2013-12-30 (×2): 1 mg via ORAL
  Filled 2013-12-29 (×2): qty 1

## 2013-12-29 MED ORDER — LEVALBUTEROL HCL 1.25 MG/0.5ML IN NEBU
1.2500 mg | INHALATION_SOLUTION | Freq: Four times a day (QID) | RESPIRATORY_TRACT | Status: DC
  Filled 2013-12-29 (×4): qty 0.5

## 2013-12-29 MED ORDER — ONDANSETRON HCL 4 MG PO TABS
4.0000 mg | ORAL_TABLET | Freq: Four times a day (QID) | ORAL | Status: DC | PRN
Start: 2013-12-29 — End: 2013-12-30

## 2013-12-29 MED ORDER — IPRATROPIUM-ALBUTEROL 0.5-2.5 (3) MG/3ML IN SOLN
3.0000 mL | Freq: Four times a day (QID) | RESPIRATORY_TRACT | Status: DC
  Administered 2013-12-29: 3 mL via RESPIRATORY_TRACT
  Filled 2013-12-29: qty 3

## 2013-12-29 MED ORDER — SIMVASTATIN 5 MG PO TABS
5.0000 mg | ORAL_TABLET | Freq: Every day | ORAL | Status: DC
  Administered 2013-12-29: 5 mg via ORAL
  Filled 2013-12-29 (×2): qty 1

## 2013-12-29 MED ORDER — ENOXAPARIN SODIUM 40 MG/0.4ML ~~LOC~~ SOLN
40.0000 mg | SUBCUTANEOUS | Status: DC
  Filled 2013-12-29 (×2): qty 0.4

## 2013-12-29 MED ORDER — IOHEXOL 350 MG/ML SOLN
100.0000 mL | Freq: Once | INTRAVENOUS | Status: AC | PRN
  Administered 2013-12-29: 100 mL via INTRAVENOUS

## 2013-12-29 MED ORDER — ALBUTEROL SULFATE (2.5 MG/3ML) 0.083% IN NEBU
5.0000 mg | INHALATION_SOLUTION | Freq: Once | RESPIRATORY_TRACT | Status: AC
  Administered 2013-12-29: 5 mg via RESPIRATORY_TRACT

## 2013-12-29 NOTE — ED Notes (Signed)
Pt presents to Ed with c/o shortness of breath.  Pt also has hx of lung cancer.

## 2013-12-29 NOTE — ED Provider Notes (Signed)
CSN: [id number]     Arrival date & time 12/29/13  0022 History   First MD Initiated Contact with Patient 12/29/13 0029     Chief Complaint  Patient presents with  . Shortness of Breath     (Consider location/radiation/quality/duration/timing/severity/associated sxs/prior Treatment) HPI History provided by patient and her husband bedside. Lung cancer and history of COPD. At home tonight developed severe wheezing and shortness of breath, not alleviated by use of her inhaler. No associated fevers or chills. No chest pain or back pain. No rash. Patient concerned she was exposed to some cleaning chemicals at home, and it may have caused her asthma/COPD flare. Symptoms moderate to severe.  Past Medical History  Diagnosis Date  . COPD (chronic obstructive pulmonary disease)   . Neutropenia, drug-induced 05/05/2012  . Hyperlipidemia   . Rheumatoid arthritis(714.0)   . Asthma   . Hiatal hernia   . Non-small cell lung cancer dx'd 08/28/11    left lung  . History of chemotherapy   . History of radiation therapy 03/06/2012    left hilum  . History of radiation therapy 05/10/2013-05/31/2013    Left lung/ 33/75@2 .25 per fraction x 15 fractions   Past Surgical History  Procedure Laterality Date  . Appendex  1962  . Video bronchoscopy  01/28/2012    Procedure: VIDEO BRONCHOSCOPY WITHOUT FLUORO;  Surgeon: Brand Males, MD;  [facility] ENDOSCOPY;  Service: Endoscopy;  Laterality: Bilateral;  . Surgery on right wrist     Family History  Problem Relation Age of Onset  . Diabetes Mother     insulin dependent  . Breast cancer Mother    History  Substance Use Topics  . Smoking status: Former Smoker -- 0.50 packs/day for 40 years    Types: Cigarettes    Quit date: 10/29/2009  . Smokeless tobacco: Never Used  . Alcohol Use: No   OB History   Grav Para Term Preterm Abortions TAB SAB Ect Mult Living                 Review of Systems  Constitutional: Negative for fever and chills.   Respiratory: Positive for shortness of breath and wheezing.   Cardiovascular: Negative for chest pain.  Gastrointestinal: Negative for abdominal pain.  Genitourinary: Negative for dysuria.  Musculoskeletal: Negative for back pain.  Skin: Negative for rash.  Neurological: Negative for headaches.  All other systems reviewed and are negative.      Allergies  Shellfish allergy  Home Medications   Current Outpatient Rx  Name  Route  Sig  Dispense  Refill  . albuterol (PROVENTIL HFA;VENTOLIN HFA) 108 (90 BASE) MCG/ACT inhaler   Inhalation   Inhale 2 puffs into the lungs every 3 (three) hours as needed. WHEEZING AND SHORTNESS OF BREATH   1 Inhaler   5   . albuterol (PROVENTIL) (2.5 MG/3ML) 0.083% nebulizer solution   Nebulization   Take 2.5 mg by nebulization every 4 (four) hours as needed for wheezing or shortness of breath.         . calcium-vitamin D (OSCAL WITH D) 500-200 MG-UNIT per tablet   Oral   Take 1 tablet by mouth daily.         . folic acid (FOLVITE) 034 MCG tablet   Oral   Take 400 mcg by mouth daily.         Marland Kitchen ipratropium (ATROVENT) 0.02 % nebulizer solution   Nebulization   Take 0.25 mg by nebulization every 4 (four) hours as needed for  wheezing.          . predniSONE (DELTASONE) 5 MG tablet   Oral   Take 5 mg by mouth daily.          . simvastatin (ZOCOR) 5 MG tablet   Oral   Take 5 mg by mouth at bedtime.          BP 132/66  Pulse 101  Temp(Src) 98.4 F (36.9 C) (Oral)  Resp 18  Ht 5\' 5"  (1.651 m)  Wt 132 lb (59.875 kg)  BMI 21.97 kg/m2  SpO2 96% Physical Exam  Constitutional: She is oriented to person, place, and time. She appears well-developed and well-nourished.  HENT:  Head: Normocephalic and atraumatic.  Eyes: EOM are normal. Pupils are equal, round, and reactive to light.  Neck: Neck supple. No tracheal deviation present.  Cardiovascular: Regular rhythm and intact distal pulses.   Tachycardic 140s  Pulmonary/Chest: No  stridor.  Tachypnea. Decreased air movement with increased work of breathing, excessive muscle use and tight bilateral expiratory wheezes  Abdominal: Soft. There is no tenderness.  Musculoskeletal: Normal range of motion. She exhibits no edema.  Neurological: She is alert and oriented to person, place, and time.  Skin: Skin is warm and dry.    ED Course  Procedures (including critical care time) Labs Review Labs Reviewed  CBC - Abnormal; Notable for the following:    RBC 3.71 (*)    Hemoglobin 9.7 (*)    HCT 28.6 (*)    MCV 77.1 (*)    All other components within normal limits  BASIC METABOLIC PANEL - Abnormal; Notable for the following:    Sodium 136 (*)    Potassium 3.5 (*)    Glucose, Bld 168 (*)    GFR calc non Af Amer 61 (*)    GFR calc Af Amer 71 (*)    All other components within normal limits  BLOOD GAS, ARTERIAL - Abnormal; Notable for the following:    Bicarbonate 24.1 (*)    All other components within normal limits  PRO B NATRIURETIC PEPTIDE  I-STAT TROPOININ, ED  I-STAT CG4 LACTIC ACID, ED   Imaging Review Dg Chest 2 View (if Patient Has Fever And/or Copd)  12/29/2013   CLINICAL DATA Shortness of breath.  EXAM CHEST  2 VIEW  COMPARISON CT CHEST W/CM dated 10/26/2013  FINDINGS Cardiac silhouette is unremarkable, fullness of the pulmonary hila may reflect patient's known neoplasm and scarring, similar to prior CT. No pleural effusions or focal consolidations. No pneumothorax.  Single lumen right chest Port-A-Cath with distal tip projecting in proximal superior vena cava. Soft tissue planes and included osseous structures are nonsuspicious, patient is osteopenic.  IMPRESSION No acute cardiopulmonary process.  SIGNATURE  Electronically Signed   By: Elon Alas   On: 12/29/2013 01:51     EKG Interpretation   Date/Time:  Wednesday December 29 2013 00:23:41 EDT Ventricular Rate:  141 PR Interval:  117 QRS Duration: 104 QT Interval:  298 QTC Calculation: 456 R  Axis:   84 Text Interpretation:  Sinus tachycardia Nonspecific ST abnormality  Abnormal ekg Confirmed by Avayah Raffety  MD, Kasheem Toner ([id number]) on 12/29/2013 12:31:17  AM     Albuterol breathing treatment. IV Solu-Medrol - on recheck now moving air better with expiratory wheezes and albuterol repeated. X-ray and labs and EKG reviewed as above. Still wheezing on exam, family would prefer patient be observed overnight and patient is agreeable to this. Dr Hal Hope to admit.   MDM  Dx: COPD Exacerbation  Presenting with significant dyspnea, wheezing and tachycardia - good improvement with medications albuterol and steroids EKG, chest x-ray, obtained and reviewed. Patient remains symptomatic.  Medicine consult and admission    Teressa Lower, MD 12/29/13 [phone]

## 2013-12-29 NOTE — H&P (Signed)
[facility] History and Physical  Destiny Harrison MAU:[id number] DOB: January 26, 1941 DOA: 12/29/2013  Referring physician: ER physician. PCP: Roger Shelter, MD  Specialists: Dr. Julien Nordmann. Oncologist.  Chief Complaint: Shortness of breath.  HPI: Destiny Harrison is a 73 y.o. female with history of COPD, non-small cell lung cancer, rheumatoid arthritis, hyperlipidemia, anemia presents to the ER because of sudden onset of shortness of breath last night. Patient's family had some disinfectant contact which spread some odor and when patient spelled it started feeling very short of breath and started wheezing coughing and since it was persistent patient was brought to the ER. In the ER patient was found to be wheezing and was placed on nebulizer treatment after which shortness of breath improved was still patient finds it difficult to take a complete breath. Patient is able to complete sentences without difficulty. Patient has been admitted for further management. Patient denies any chest pain fever chills focal deficits nausea vomiting or diarrhea.   Review of Systems: As presented in the history of presenting illness, rest negative.  Past Medical History  Diagnosis Date  . COPD (chronic obstructive pulmonary disease)   . Neutropenia, drug-induced 05/05/2012  . Hyperlipidemia   . Rheumatoid arthritis(714.0)   . Asthma   . Hiatal hernia   . Non-small cell lung cancer dx'd 08/28/11    left lung  . History of chemotherapy   . History of radiation therapy 03/06/2012    left hilum  . History of radiation therapy 05/10/2013-05/31/2013    Left lung/ 33/75@2 .25 per fraction x 15 fractions   Past Surgical History  Procedure Laterality Date  . Appendex  1962  . Video bronchoscopy  01/28/2012    Procedure: VIDEO BRONCHOSCOPY WITHOUT FLUORO;  Surgeon: Brand Males, MD;  Location: [facility] ENDOSCOPY;  Service: Endoscopy;  Laterality: Bilateral;  . Surgery on right wrist     Social History:  reports that she  quit smoking about 4 years ago. Her smoking use included Cigarettes. She has a 20 pack-year smoking history. She has never used smokeless tobacco. She reports that she does not drink alcohol or use illicit drugs. Where does patient live home. Can patient participate in ADLs? Yes.  Allergies  Allergen Reactions  . Shellfish Allergy Swelling    Family History:  Family History  Problem Relation Age of Onset  . Diabetes Mother     insulin dependent  . Breast cancer Mother       Prior to Admission medications   Medication Sig Start Date End Date Taking? Authorizing Provider  albuterol (PROVENTIL HFA;VENTOLIN HFA) 108 (90 BASE) MCG/ACT inhaler Inhale 2 puffs into the lungs every 3 (three) hours as needed. WHEEZING AND SHORTNESS OF BREATH 08/12/13  Yes Brand Males, MD  albuterol (PROVENTIL) (2.5 MG/3ML) 0.083% nebulizer solution Take 2.5 mg by nebulization every 4 (four) hours as needed for wheezing or shortness of breath.   Yes Historical Provider, MD  calcium-vitamin D (OSCAL WITH D) 500-200 MG-UNIT per tablet Take 1 tablet by mouth daily.   Yes Historical Provider, MD  folic acid (FOLVITE) 563 MCG tablet Take 400 mcg by mouth daily.   Yes Historical Provider, MD  ipratropium (ATROVENT) 0.02 % nebulizer solution Take 0.25 mg by nebulization every 4 (four) hours as needed for wheezing.  03/24/12 06/29/14 Yes Brand Males, MD  predniSONE (DELTASONE) 5 MG tablet Take 5 mg by mouth daily.    Yes Historical Provider, MD  simvastatin (ZOCOR) 5 MG tablet Take 5 mg by mouth at bedtime.   Yes  Historical Provider, MD    Physical Exam: Filed Vitals:   12/29/13 0128 12/29/13 0215 12/29/13 0312 12/29/13 0418  BP:   132/66 134/83  Pulse:  107 101   Temp:   98.4 F (36.9 C) 98.4 F (36.9 C)  TempSrc:   Oral Oral  Resp:  19 18   Height:      Weight:    63.322 kg (139 lb 9.6 oz)  SpO2: 98% 98% 96% 96%     General:  Well-developed and moderately nourished.  Eyes: Anicteric no  pallor.  ENT: No discharge from the ears eyes nose mouth.  Neck: No mass felt. JVD not appreciated.  Cardiovascular: S1-S2 heard.  Respiratory: Mild expiratory wheeze heard no crepitations.  Abdomen: Soft nontender bowel sounds present. No guarding or rigidity.  Skin: No rash.  Musculoskeletal: No edema.  Psychiatric: Appears normal.  Neurologic: Alert awake oriented to time place and person. Moves all extremities.  Labs on Admission:  Basic Metabolic Panel:  Recent Labs Lab 12/29/13 0035  NA 136*  K 3.5*  CL 98  CO2 24  GLUCOSE 168*  BUN 11  CREATININE 0.90  CALCIUM 9.1   Liver Function Tests: No results found for this basename: AST, ALT, ALKPHOS, BILITOT, PROT, ALBUMIN,  in the last 168 hours No results found for this basename: LIPASE, AMYLASE,  in the last 168 hours No results found for this basename: AMMONIA,  in the last 168 hours CBC:  Recent Labs Lab 12/29/13 0035  WBC 7.5  HGB 9.7*  HCT 28.6*  MCV 77.1*  PLT 368   Cardiac Enzymes: No results found for this basename: CKTOTAL, CKMB, CKMBINDEX, TROPONINI,  in the last 168 hours  BNP (last 3 results)  Recent Labs  12/29/13 0035  PROBNP 93.5   CBG: No results found for this basename: GLUCAP,  in the last 168 hours  Radiological Exams on Admission: Dg Chest 2 View (if Patient Has Fever And/or Copd)  12/29/2013   CLINICAL DATA Shortness of breath.  EXAM CHEST  2 VIEW  COMPARISON CT CHEST W/CM dated 10/26/2013  FINDINGS Cardiac silhouette is unremarkable, fullness of the pulmonary hila may reflect patient's known neoplasm and scarring, similar to prior CT. No pleural effusions or focal consolidations. No pneumothorax.  Single lumen right chest Port-A-Cath with distal tip projecting in proximal superior vena cava. Soft tissue planes and included osseous structures are nonsuspicious, patient is osteopenic.  IMPRESSION No acute cardiopulmonary process.  SIGNATURE  Electronically Signed   By: Elon Alas   On: 12/29/2013 01:51    Assessment/Plan Principal Problem:   COPD exacerbation Active Problems:   Lung cancer   Hyperlipidemia   Anemia   1. Shortness of breath most likely secondary to asthmatic bronchitis in a patient with COPD probably precipitated by the odor - continue with IV Solu-Medrol nebulizer and Pulmicort. Closely observe for now. 2. Anemia - patient's hemoglobin has decreased from earlier last month. I have ordered repeat CBC. Closely follow CBC for any further decrease in hemoglobin. 3. History of rheumatoid arthritis on prednisone - presently patient is on IV Solu-Medrol. 4. Hyperlipidemia - continue statins. 5. History of non-small cell lung cancer - per oncologist.  I have reviewed patient's old charts and labs.  Code Status: Full code.  Family Communication: Family at the bedside.  Disposition Plan: Admit to inpatient.    Margalit Leece N. [facility] Pager [phone].  If 7PM-7AM, please contact night-coverage [email or web address] Password [email or web address] 12/29/2013, 4:55 AM

## 2013-12-29 NOTE — Progress Notes (Signed)
ANTIBIOTIC CONSULT NOTE - INITIAL  Pharmacy Consult for Levaquin Indication: COPD exacerbation  Allergies  Allergen Reactions  . Shellfish Allergy Swelling    Patient Measurements: Height: 5\' 5"  (165.1 cm) Weight: 139 lb 9.6 oz (63.322 kg) IBW/kg (Calculated) : 57   Vital Signs: Temp: 98 F (36.7 C) (03/11 9767) Temp src: Oral (03/11 0633) BP: 130/86 mmHg (03/11 3419) Pulse Rate: 102 (03/11 0633) Intake/Output from previous day:   Intake/Output from this shift:    Labs:  Recent Labs  12/29/13 0035 12/29/13 0600  WBC 7.5 5.9  HGB 9.7* 9.5*  PLT 368 364  CREATININE 0.90 0.78   Estimated Creatinine Clearance: 55.5 ml/min (by C-G formula based on Cr of 0.78).    Microbiology: No results found for this or any previous visit (from the past 720 hour(s)).  Medical History: Past Medical History  Diagnosis Date  . COPD (chronic obstructive pulmonary disease)   . Neutropenia, drug-induced 05/05/2012  . Hyperlipidemia   . Rheumatoid arthritis(714.0)   . Asthma   . Hiatal hernia   . Non-small cell lung cancer dx'd 08/28/11    left lung  . History of chemotherapy   . History of radiation therapy 03/06/2012    left hilum  . History of radiation therapy 05/10/2013-05/31/2013    Left lung/ 33/75@2 .25 per fraction x 15 fractions    Medications:  Scheduled:  . budesonide (PULMICORT) nebulizer solution  0.25 mg Nebulization BID  . enoxaparin (LOVENOX) injection  40 mg Subcutaneous Q24H  . folic acid  3,790 mcg Oral Daily  . ipratropium-albuterol  3 mL Nebulization Q6H  . [START ON 12/30/2013] predniSONE  60 mg Oral Q breakfast  . simvastatin  5 mg Oral QHS  . sodium chloride  3 mL Intravenous Q12H   Infusions:   PRN: acetaminophen, acetaminophen, albuterol, ondansetron (ZOFRAN) IV, ondansetron  Assessment:  73 y/o F admitted with shortness of breath and COPD exacerbation after exposure to disinfectant odor.  History of NSCLC noted.  Orders received to begin a 3-day  course of Levaquin with pharmacy dosing assistance.  Plan:  1. Levaquin 500 mg PO daily x 3 days 2. Follow serum creatinine, clinical course.  Clayburn Pert, PharmD, BCPS Pager: [phone] 12/29/2013  11:45 AM

## 2013-12-29 NOTE — Progress Notes (Addendum)
[facility] PROGRESS NOTE  Destiny Harrison JOA:[id number] DOB: 11/08/1940 DOA: 12/29/2013 PCP: Roger Shelter, MD  Assessment/Plan  Acute hypoxic respiratory failure due to COPD exacerbation triggered by exposure to odor -  Transition to prednisone tomorrow -  Continue duonebs -  Start 3 day course of abx -  Cough medications not helpful per patient -  Advised to walk halls/ check pulse ox with ambulation today  Sinus tachycardia, likely secondary to BD -  Continue ipra -  Change albuterol to xopenex -  Check TSH -  D-dimer   Anemia - patient's hemoglobin has decreased from earlier last month. I have ordered repeat CBC. Closely follow CBC for any further decrease in hemoglobin.   Rheumatoid arthritis on chronic prednisone -  Wean pred to home dose  Hyperlipidemia - continue statins.   Non-small cell lung cancer - per oncologist.  Microcytic anemia likely due to chronic disease from cancer -  Defer to hematologist  Diet:  regular Access:  PIV   IVF:  off Proph:  lovenox  Code Status: full Family Communication: patient alone Disposition Plan:  Possibly home tomorrow if breathing continuing to improve   Consultants:  none  Procedures:  CXR  Antibiotics:  Levofloxacin 3/11   HPI/Subjective:  Breathing not back to baseline, but improved since admission.  Steroids and breathing treatments have been helping    Objective: Filed Vitals:   12/29/13 0312 12/29/13 0418 12/29/13 0633 12/29/13 0853  BP: 132/66 134/83 130/86   Pulse: 101  102   Temp: 98.4 F (36.9 C) 98.4 F (36.9 C) 98 F (36.7 C)   TempSrc: Oral Oral Oral   Resp: 18  20   Height:      Weight:  63.322 kg (139 lb 9.6 oz)    SpO2: 96% 96% 95% 98%   No intake or output data in the 24 hours ending 12/29/13 1140 Filed Weights   12/29/13 0025 12/29/13 0418  Weight: 59.875 kg (132 lb) 63.322 kg (139 lb 9.6 oz)    Exam:   General:  Thin BF, No acute distress, tachypneic at rest and some  gasps to catch breath when talking  HEENT:  NCAT, MMM  Cardiovascular:  RRR, nl S1, S2 no mrg, 2+ pulses, warm extremities  Respiratory:  Bronchial bs bilaterally, no focal rales or wheezes.  Diminished bilaterally, no increased WOB  Abdomen:   NABS, soft, NT/ND  MSK:   Normal tone and bulk, no LEE  Neuro:  Grossly intact  Data Reviewed: Basic Metabolic Panel:  Recent Labs Lab 12/29/13 0035 12/29/13 0600  NA 136* 138  K 3.5* 3.8  CL 98 100  CO2 24 25  GLUCOSE 168* 159*  BUN 11 11  CREATININE 0.90 0.78  CALCIUM 9.1 9.4   Liver Function Tests: No results found for this basename: AST, ALT, ALKPHOS, BILITOT, PROT, ALBUMIN,  in the last 168 hours No results found for this basename: LIPASE, AMYLASE,  in the last 168 hours No results found for this basename: AMMONIA,  in the last 168 hours CBC:  Recent Labs Lab 12/29/13 0035 12/29/13 0600  WBC 7.5 5.9  HGB 9.7* 9.5*  HCT 28.6* 29.2*  MCV 77.1* 77.0*  PLT 368 364   Cardiac Enzymes: No results found for this basename: CKTOTAL, CKMB, CKMBINDEX, TROPONINI,  in the last 168 hours BNP (last 3 results)  Recent Labs  12/29/13 0035  PROBNP 93.5   CBG: No results found for this basename: GLUCAP,  in the last 168 hours  No results found for this or any previous visit (from the past 240 hour(s)).   Studies: Dg Chest 2 View (if Patient Has Fever And/or Copd)  12/29/2013   CLINICAL DATA Shortness of breath.  EXAM CHEST  2 VIEW  COMPARISON CT CHEST W/CM dated 10/26/2013  FINDINGS Cardiac silhouette is unremarkable, fullness of the pulmonary hila may reflect patient's known neoplasm and scarring, similar to prior CT. No pleural effusions or focal consolidations. No pneumothorax.  Single lumen right chest Port-A-Cath with distal tip projecting in proximal superior vena cava. Soft tissue planes and included osseous structures are nonsuspicious, patient is osteopenic.  IMPRESSION No acute cardiopulmonary process.  SIGNATURE   Electronically Signed   By: Elon Alas   On: 12/29/2013 01:51    Scheduled Meds: . budesonide (PULMICORT) nebulizer solution  0.25 mg Nebulization BID  . enoxaparin (LOVENOX) injection  40 mg Subcutaneous Q24H  . folic acid  3,154 mcg Oral Daily  . ipratropium-albuterol  3 mL Nebulization Q6H  . [START ON 12/30/2013] predniSONE  60 mg Oral Q breakfast  . simvastatin  5 mg Oral QHS  . sodium chloride  3 mL Intravenous Q12H   Continuous Infusions:   Principal Problem:   COPD exacerbation Active Problems:   Lung cancer   Hyperlipidemia   Anemia    Time spent: 30 min    Hamed Debella, [facility] Pager [phone]. If 7PM-7AM, please contact night-coverage at [email or web address], password [email or web address] 12/29/2013, 11:40 AM  LOS: 0 days

## 2013-12-30 DIAGNOSIS — D649 Anemia, unspecified: Secondary | ICD-10-CM | POA: Diagnosis not present

## 2013-12-30 DIAGNOSIS — J441 Chronic obstructive pulmonary disease with (acute) exacerbation: Secondary | ICD-10-CM | POA: Diagnosis not present

## 2013-12-30 DIAGNOSIS — C3490 Malignant neoplasm of unspecified part of unspecified bronchus or lung: Secondary | ICD-10-CM | POA: Diagnosis not present

## 2013-12-30 MED ORDER — PREDNISONE 20 MG PO TABS: ORAL_TABLET | ORAL | Status: DC

## 2013-12-30 MED ORDER — LEVOFLOXACIN 500 MG PO TABS: 500.0000 mg | ORAL_TABLET | Freq: Every day | ORAL | Status: DC

## 2013-12-30 NOTE — Discharge Summary (Signed)
Physician Discharge Summary  Destiny Harrison OAC:[id number] DOB: 05-10-1941 DOA: 12/29/2013  PCP: Thressa Sheller, MD  Admit date: 12/29/2013 Discharge date: 12/30/2013  Recommendations for Outpatient Follow-up:  1. Follow up with primary care doctor in 1 week for reevaluation of breathing 2. Further work up of anemia per hematology/oncology  Discharge Diagnoses:  Principal Problem:   COPD exacerbation Active Problems:   Lung cancer   Hyperlipidemia   Anemia   Discharge Condition: stable, improved  Diet recommendation: regular  Wt Readings from Last 3 Encounters:  12/29/13 63.322 kg (139 lb 9.6 oz)  11/23/13 62.959 kg (138 lb 12.8 oz)  11/17/13 62.506 kg (137 lb 12.8 oz)    History of present illness:  Destiny Harrison is a 73 y.o. female with history of COPD, non-small cell lung cancer, rheumatoid arthritis, hyperlipidemia, anemia presents to the ER because of sudden onset of shortness of breath last night. Patient's family had some disinfectant contact which spread some odor and when patient spelled it started feeling very [facility] [facility] of breath and started wheezing coughing and since it was persistent patient was brought to the ER. In the ER patient was found to be wheezing and was placed on nebulizer treatment after which shortness of breath improved was still patient finds it difficult to take a complete breath. Patient is able to complete sentences without difficulty. Patient has been admitted for further management. Patient denies any chest pain fever chills focal deficits nausea vomiting or diarrhea.    Hospital Course:  Acute hypoxic respiratory failure due to COPD exacerbation triggered by exposure to odor.  She was started on solumedrol and quickly tapered to prednisone.  She had rapid improvement in her SOB with duonebs.  She should complete a Asani Deniston steroid taper and a total of three days of antibiotics for COPD exacerbation.  She was able to ambulate on room air without desaturation or  significant dyspnea the day of discharge.    Sinus tachycardia, likely secondary to BD and SOB.  TSH was wnl 0.375.  D-dimer elevated at 3.75 and follow up CT angio chest negative for PE.  Her tachycardia improved with improvement in her wheezing.    Microcytic anemia - patient's hemoglobin has gradually decreased from earlier last month, but no obvious bleeding.  May be due to treatment for cancer.  Defer to hematology/oncology.  Rheumatoid arthritis on chronic prednisone.  Wean prednisone quickly to home dose.  Hyperlipidemia - continue statins.   Non-small cell lung cancer - per oncologist.   Consultants:  none Procedures:  CXR Antibiotics:  Levofloxacin 3/11    Discharge Exam: Filed Vitals:   12/30/13 0600  BP: 107/71  Pulse: 94  Temp: 97.9 F (36.6 C)  Resp: 18   Filed Vitals:   12/29/13 0853 12/29/13 2145 12/30/13 0600 12/30/13 0910  BP:  99/60 107/71   Pulse:  94 94   Temp:  98.1 F (36.7 C) 97.9 F (36.6 C)   TempSrc:  Oral Oral   Resp:  20 18   Height:      Weight:      SpO2: 98% 100% 93% 98%    General: Thin BF, No acute distress and comfortable talking today HEENT: NCAT, MMM  Cardiovascular: RRR, nl S1, S2 no mrg, 2+ pulses, warm extremities  Respiratory:  Diminished bilaterally, no focal rales, rhonchi, or wheeze, no increased WOB  Abdomen: NABS, soft, NT/ND  MSK: Normal tone and bulk, no LEE  Neuro: Grossly intact   Discharge Instructions      Discharge  Orders   Future Appointments Provider Department Dept Phone   01/06/2014 2:30 PM Thea Silversmith, MD [facility] [phone]   01/20/2014 8:00 AM [facility] [phone]   01/20/2014 9:00 AM [facility] [phone]   Liquids only 4 hours prior to your exam. Any medications can be taken as usual. Please arrive 15 min prior to your scheduled exam time.   01/25/2014 10:30 AM Curt Bears, MD [facility] [phone]   Future Orders Complete By Expires   Call MD for:  difficulty breathing, headache or visual disturbances  As directed    Call MD for:  extreme fatigue  As directed    Call MD for:  hives  As directed    Call MD for:  persistant dizziness or light-headedness  As directed    Call MD for:  persistant nausea and vomiting  As directed    Call MD for:  severe uncontrolled pain  As directed    Call MD for:  temperature >100.4  As directed    Diet general  As directed    Discharge instructions  As directed    Comments:     You were hospitalized with a COPD exacerbation triggered by a bad odor.  Please take prednisone in a tapering dose over the next 4 days and after it is complete, please resume your usual prednisone dose.  Take one more dose of antibiotic tomorrow morning.  Please return to the hospital if you become [facility] of breath again.   Increase activity slowly  As directed        Medication List         albuterol (2.5 MG/3ML) 0.083% nebulizer solution  Commonly known as:  PROVENTIL  Take 2.5 mg by nebulization every 4 (four) hours as needed for wheezing or shortness of breath.     albuterol 108 (90 BASE) MCG/ACT inhaler  Commonly known as:  PROVENTIL HFA;VENTOLIN HFA  Inhale 2 puffs into the lungs every 3 (three) hours as needed. WHEEZING AND SHORTNESS OF BREATH     calcium-vitamin D 500-200 MG-UNIT per tablet  Commonly known as:  OSCAL WITH D  Take 1 tablet by mouth daily.     folic acid 703 MCG tablet  Commonly known as:  FOLVITE  Take 400 mcg by mouth daily.     ipratropium 0.02 % nebulizer solution  Commonly known as:  ATROVENT  Take 0.25 mg by nebulization every 4 (four) hours as needed for wheezing.     levofloxacin 500 MG tablet  Commonly known as:  LEVAQUIN  Take 1 tablet (500 mg total) by mouth daily.     predniSONE 5 MG tablet  Commonly known as:  DELTASONE  Take 5 mg by mouth daily.      predniSONE 20 MG tablet  Commonly known as:  DELTASONE  Take 3 tabs on day 1, 2 tabs on day 2, 1 tab on day 3, half tab on day 4, then resume your usual prednisone     simvastatin 5 MG tablet  Commonly known as:  ZOCOR  Take 5 mg by mouth at bedtime.       Follow-up Information   Follow up with Thressa Sheller, MD. Schedule an appointment as soon as possible for a visit in 2 weeks.   Specialty:  Internal Medicine   Contact information:   [address], [address] [place] [address] [phone]  The results of significant diagnostics from this hospitalization (including imaging, microbiology, ancillary and laboratory) are listed below for reference.    Significant Diagnostic Studies: Dg Chest 2 View (if Patient Has Fever And/or Copd)  12/29/2013   CLINICAL DATA Shortness of breath.  EXAM CHEST  2 VIEW  COMPARISON CT CHEST W/CM dated 10/26/2013  FINDINGS Cardiac silhouette is unremarkable, fullness of the pulmonary hila may reflect patient's known neoplasm and scarring, similar to prior CT. No pleural effusions or focal consolidations. No pneumothorax.  Single lumen right chest Port-A-Cath with distal tip projecting in proximal superior vena cava. Soft tissue planes and included osseous structures are nonsuspicious, patient is osteopenic.  IMPRESSION No acute cardiopulmonary process.  SIGNATURE  Electronically Signed   By: Elon Alas   On: 12/29/2013 01:51   Ct Angio Chest Pe W/cm &/or Wo Cm  12/29/2013   CLINICAL DATA Shortness of breath, tachycardia, history of lung cancer per  EXAM CT ANGIOGRAPHY CHEST WITH CONTRAST  TECHNIQUE Multidetector CT imaging of the chest was performed using the standard protocol during bolus administration of intravenous contrast. Multiplanar CT image reconstructions and MIPs were obtained to evaluate the vascular anatomy.  CONTRAST 132mL OMNIPAQUE IOHEXOL 350 MG/ML SOLN  COMPARISON 12/29/2013 radiograph, 10/25/2013 CT  FINDINGS The  contrast bolus timing is not optimized to evaluate pulmonary embolism with even central vessels on the right poorly opacified. Within this limitation, no pulmonary arterial branch filling defect is identified.  Normal caliber aorta with scattered atherosclerotic disease.  High right paratracheal node measures 1.5 cm Margan Elias axis, similar to prior. Right chest wall Port-A-Cath with catheter tip in the mid SVC. Normal heart size. No pleural or pericardial effusion.  The right middle lobe/perihilar nodular density seen previously and measures smaller at 1 cm as compared to 2.1 cm on the prior. Left paramediastinal opacities are in keeping with radiation change. Biapical scarring. Emphysema. No pneumothorax.  Calcified granuloma within the liver. Otherwise, upper abdominal images show nothing acute.  Osteopenia. Interval compression deformity with 25% height loss at T5. No retropulsion.  Review of the MIP images confirms the above findings.  IMPRESSION Suboptimal contrast bolus timing. No pulmonary embolism identified within this limitation. If clinical concern remains high, recommend a repeat exam or V/Q scan.  Interval T5 compression fracture with 25% height loss. No retropulsion. Background osteopenia. No definite osseous lesion. Attention on follow-up.  Right middle lobe/perihilar nodular density has decreased in size.  Left paramediastinal consolidation/radiation changes and prominent high right paratracheal lymph node, similar to prior.  SIGNATURE  Electronically Signed   By: Carlos Levering M.D.   On: 12/29/2013 21:09    Microbiology: No results found for this or any previous visit (from the past 240 hour(s)).   Labs: Basic Metabolic Panel:  Recent Labs Lab 12/29/13 0035 12/29/13 0600  NA 136* 138  K 3.5* 3.8  CL 98 100  CO2 24 25  GLUCOSE 168* 159*  BUN 11 11  CREATININE 0.90 0.78  CALCIUM 9.1 9.4   Liver Function Tests: No results found for this basename: AST, ALT, ALKPHOS, BILITOT, PROT,  ALBUMIN,  in the last 168 hours No results found for this basename: LIPASE, AMYLASE,  in the last 168 hours No results found for this basename: AMMONIA,  in the last 168 hours CBC:  Recent Labs Lab 12/29/13 0035 12/29/13 0600  WBC 7.5 5.9  HGB 9.7* 9.5*  HCT 28.6* 29.2*  MCV 77.1* 77.0*  PLT 368 364   Cardiac Enzymes: No results found  for this basename: CKTOTAL, CKMB, CKMBINDEX, TROPONINI,  in the last 168 hours BNP: BNP (last 3 results)  Recent Labs  12/29/13 0035  PROBNP 93.5   CBG: No results found for this basename: GLUCAP,  in the last 168 hours  Time coordinating discharge: 35 minutes  Signed:  Seba Madole  [facility] 12/30/2013, 11:04 AM

## 2013-12-30 NOTE — Progress Notes (Signed)
Discharge instructions explained to the pt. Denies having any questions. IV dc'd. Discharged to son to go home.

## 2014-01-04 ENCOUNTER — Telehealth: Payer: Self-pay | Admitting: Medical Oncology

## 2014-01-04 DIAGNOSIS — J441 Chronic obstructive pulmonary disease with (acute) exacerbation: Secondary | ICD-10-CM | POA: Diagnosis not present

## 2014-01-04 DIAGNOSIS — IMO0002 Reserved for concepts with insufficient information to code with codable children: Secondary | ICD-10-CM | POA: Diagnosis not present

## 2014-01-04 DIAGNOSIS — M1710 Unilateral primary osteoarthritis, unspecified knee: Secondary | ICD-10-CM | POA: Diagnosis not present

## 2014-01-04 NOTE — Telephone Encounter (Signed)
Pt requests refill and pick up rx tomorrow. . Note to Dr Julien Nordmann

## 2014-01-06 ENCOUNTER — Ambulatory Visit
Admission: RE | Admit: 2014-01-06 | Discharge: 2014-01-06 | Disposition: A | Discharge status: Home / Self Care | Payer: Medicare Other | Source: Ambulatory Visit | Attending: Radiation Oncology | Admitting: Radiation Oncology

## 2014-01-06 ENCOUNTER — Other Ambulatory Visit: Payer: Self-pay | Admitting: Physician Assistant

## 2014-01-06 ENCOUNTER — Other Ambulatory Visit: Payer: Self-pay | Admitting: Medical Oncology

## 2014-01-06 VITALS — BP 137/67 | HR 97 | Temp 98.5°F | Resp 20 | Wt 139.5 lb

## 2014-01-06 DIAGNOSIS — C3490 Malignant neoplasm of unspecified part of unspecified bronchus or lung: Secondary | ICD-10-CM

## 2014-01-06 MED ORDER — HYDROCODONE-ACETAMINOPHEN 5-325 MG PO TABS: 1.0000 | ORAL_TABLET | ORAL | Status: DC | PRN

## 2014-01-06 NOTE — Progress Notes (Signed)
Routien one month follow up for radiation to right hilum.Admitted one week ago for copd exacerabation.ct angi test done on 12/29/13 was negative for pulmonary embolism, decrease in size of right middle lobe nodule.Continued shortness of breath on exertion.Back pain,  Will stop by med/onc to pick up script for this.Questions whether to cancel ct chest scheduled for 01/20/14.Oxygen 98% on room air.

## 2014-01-06 NOTE — Telephone Encounter (Signed)
Pain med refilled per Dr Julien Nordmann.

## 2014-01-06 NOTE — Progress Notes (Signed)
Department of Radiation Oncology  Phone:  [phone] Fax:        [phone]   Name: Sukhman Kocher MRN: [id number]  DOB: February 16, 1941  Date: 01/06/2014  Follow Up Visit Note  Diagnosis: Metastatic NSCLC   Summary and Interval since last radiation: Radiation to the right hilum to a total dose of 30 Gy in 3 Fractions completed 11/26/13 (prev RT to right neck and right paratracheal LN to 33.75 completed 05/31/13 and left hilum 30 Gy in 10 fractions completed 03/06/12)  Interval History: Destiny Harrison presents today for routine followup.  She was hospitalized recently for shortness of breath. She had a CT of the chest performed on 12/29/2013 which showed a decrease in size of the right middle lobe lesion. This also showed stability of the right paratracheal node. She unfortunately had a T5 compression fracture and that truly do to her main complaint which is back pain. She's been taking Aleve with little relief. She has followup scheduled with Dr. Julien Nordmann in April. She denies any chest pain. Her shortness of breath has resolved. She denies any hemoptysis or headaches. She denies any leg pain or weakness.  Allergies:  Allergies  Allergen Reactions  . Shellfish Allergy Swelling    Medications:  Current Outpatient Prescriptions  Medication Sig Dispense Refill  . albuterol (PROVENTIL HFA;VENTOLIN HFA) 108 (90 BASE) MCG/ACT inhaler Inhale 2 puffs into the lungs every 3 (three) hours as needed. WHEEZING AND SHORTNESS OF BREATH  1 Inhaler  5  . albuterol (PROVENTIL) (2.5 MG/3ML) 0.083% nebulizer solution Take 2.5 mg by nebulization every 4 (four) hours as needed for wheezing or shortness of breath.      . calcium-vitamin D (OSCAL WITH D) 500-200 MG-UNIT per tablet Take 1 tablet by mouth daily.      . folic acid (FOLVITE) 176 MCG tablet Take 400 mcg by mouth daily.      Marland Kitchen ipratropium (ATROVENT) 0.02 % nebulizer solution Take 0.25 mg by nebulization every 4 (four) hours as needed for wheezing.       .  predniSONE (DELTASONE) 5 MG tablet Take 5 mg by mouth daily.       . simvastatin (ZOCOR) 5 MG tablet Take 5 mg by mouth at bedtime.      Marland Kitchen levofloxacin (LEVAQUIN) 500 MG tablet Take 1 tablet (500 mg total) by mouth daily.  1 tablet  0  . predniSONE (DELTASONE) 20 MG tablet Take 3 tabs on day 1, 2 tabs on day 2, 1 tab on day 3, half tab on day 4, then resume your usual prednisone  7 tablet  0   No current facility-administered medications for this encounter.    Physical Exam:  Filed Vitals:   01/06/14 1411  BP: 137/67  Pulse: 97  Temp: 98.5 F (36.9 C)  Resp: 20  Weight: 139 lb 8 oz (63.277 kg)  SpO2: 98%   pleasant female in no distress sitting comfortably examining table. She has tenderness in the mid scapular portion of the thoracic spine. She is alert and oriented x3.  IMPRESSION: Destiny Harrison is a 73 y.o. female status post palliative radiation with excellent response to treatment on recent CT of the chest  PLAN:  I'm pleased with him as response to treatment. About she's feeling well. We discussed that she is not a candidate for further radiation to to the combined dose that she is received from her previous 3 fractions. She understands this. I told her perhaps Dr. Julien Nordmann would allow her to cancel her scan  in April as well just be about 2 weeks from her scan that she had while she was in the hospital. She scheduled followup with her primary care physician regarding pain medications for her compression fracture.    Thea Silversmith, MD

## 2014-01-19 ENCOUNTER — Other Ambulatory Visit: Payer: Self-pay | Admitting: *Deleted

## 2014-01-20 ENCOUNTER — Encounter (HOSPITAL_COMMUNITY): Payer: Self-pay

## 2014-01-20 ENCOUNTER — Ambulatory Visit (HOSPITAL_COMMUNITY)
Admission: RE | Admit: 2014-01-20 | Discharge: 2014-01-20 | Disposition: A | Discharge status: Home / Self Care | Payer: Medicare Other | Source: Ambulatory Visit | Attending: Internal Medicine | Admitting: Internal Medicine

## 2014-01-20 ENCOUNTER — Other Ambulatory Visit (HOSPITAL_BASED_OUTPATIENT_CLINIC_OR_DEPARTMENT_OTHER): Payer: Medicare Other

## 2014-01-20 ENCOUNTER — Ambulatory Visit (HOSPITAL_COMMUNITY): Admission: RE | Admit: 2014-01-20 | Payer: Medicare Other | Source: Ambulatory Visit

## 2014-01-20 DIAGNOSIS — C3490 Malignant neoplasm of unspecified part of unspecified bronchus or lung: Secondary | ICD-10-CM | POA: Insufficient documentation

## 2014-01-20 DIAGNOSIS — M899 Disorder of bone, unspecified: Secondary | ICD-10-CM | POA: Insufficient documentation

## 2014-01-20 DIAGNOSIS — C3410 Malignant neoplasm of upper lobe, unspecified bronchus or lung: Secondary | ICD-10-CM

## 2014-01-20 DIAGNOSIS — M949 Disorder of cartilage, unspecified: Secondary | ICD-10-CM

## 2014-01-20 DIAGNOSIS — N859 Noninflammatory disorder of uterus, unspecified: Secondary | ICD-10-CM | POA: Insufficient documentation

## 2014-01-20 LAB — CBC WITH DIFFERENTIAL/PLATELET
BASO%: 0.6 % (ref 0.0–2.0)
BASOS ABS: 0 10*3/uL (ref 0.0–0.1)
EOS%: 5.3 % (ref 0.0–7.0)
Eosinophils Absolute: 0.2 10*3/uL (ref 0.0–0.5)
HCT: 31.9 % — ABNORMAL LOW (ref 34.8–46.6)
HEMOGLOBIN: 10.6 g/dL — AB (ref 11.6–15.9)
LYMPH%: 25.7 % (ref 14.0–49.7)
MCH: 26.1 pg (ref 25.1–34.0)
MCHC: 33.1 g/dL (ref 31.5–36.0)
MCV: 78.8 fL — ABNORMAL LOW (ref 79.5–101.0)
MONO#: 0.7 10*3/uL (ref 0.1–0.9)
MONO%: 15.8 % — ABNORMAL HIGH (ref 0.0–14.0)
NEUT%: 52.6 % (ref 38.4–76.8)
NEUTROS ABS: 2.2 10*3/uL (ref 1.5–6.5)
Platelets: 271 10*3/uL (ref 145–400)
RBC: 4.05 10*6/uL (ref 3.70–5.45)
RDW: 16.7 % — AB (ref 11.2–14.5)
WBC: 4.1 10*3/uL (ref 3.9–10.3)
lymph#: 1.1 10*3/uL (ref 0.9–3.3)

## 2014-01-20 LAB — COMPREHENSIVE METABOLIC PANEL (CC13)
ALT: 8 U/L (ref 0–55)
AST: 14 U/L (ref 5–34)
Albumin: 3.5 g/dL (ref 3.5–5.0)
Alkaline Phosphatase: 86 U/L (ref 40–150)
Anion Gap: 12 mEq/L — ABNORMAL HIGH (ref 3–11)
BUN: 11.5 mg/dL (ref 7.0–26.0)
CALCIUM: 10.2 mg/dL (ref 8.4–10.4)
CO2: 25 meq/L (ref 22–29)
CREATININE: 0.8 mg/dL (ref 0.6–1.1)
Chloride: 102 mEq/L (ref 98–109)
Glucose: 96 mg/dl (ref 70–140)
Potassium: 4 mEq/L (ref 3.5–5.1)
Sodium: 139 mEq/L (ref 136–145)
Total Bilirubin: 0.46 mg/dL (ref 0.20–1.20)
Total Protein: 8.1 g/dL (ref 6.4–8.3)

## 2014-01-20 MED ORDER — IOHEXOL 300 MG/ML  SOLN
100.0000 mL | Freq: Once | INTRAMUSCULAR | Status: AC | PRN
  Administered 2014-01-20: 100 mL via INTRAVENOUS

## 2014-01-25 ENCOUNTER — Ambulatory Visit (HOSPITAL_BASED_OUTPATIENT_CLINIC_OR_DEPARTMENT_OTHER): Payer: Medicare Other | Admitting: Internal Medicine

## 2014-01-25 ENCOUNTER — Encounter: Payer: Self-pay | Admitting: Internal Medicine

## 2014-01-25 ENCOUNTER — Telehealth: Payer: Self-pay | Admitting: Internal Medicine

## 2014-01-25 VITALS — BP 128/57 | HR 107 | Temp 98.0°F | Resp 18 | Ht 65.0 in | Wt 134.1 lb

## 2014-01-25 DIAGNOSIS — J449 Chronic obstructive pulmonary disease, unspecified: Secondary | ICD-10-CM | POA: Diagnosis not present

## 2014-01-25 DIAGNOSIS — C3490 Malignant neoplasm of unspecified part of unspecified bronchus or lung: Secondary | ICD-10-CM

## 2014-01-25 DIAGNOSIS — C3410 Malignant neoplasm of upper lobe, unspecified bronchus or lung: Secondary | ICD-10-CM

## 2014-01-25 MED ORDER — METHYLPREDNISOLONE (PAK) 4 MG PO TABS: ORAL_TABLET | ORAL | Status: DC

## 2014-01-25 NOTE — Telephone Encounter (Signed)
gv and printed aptps ched and avs for pt for May thru July....gv pt barium

## 2014-01-25 NOTE — Progress Notes (Signed)
[place] [phone]) [phone]   Fax:(336) [address] [place], [place], [address] [place] [address]  DIAGNOSIS: Metastatic non-small cell lung cancer, squamous cell carcinoma diagnosed in March of 2013.   PRIOR THERAPY:  1. Status post palliative radiotherapy to the left lung mass under the care of Dr. Pablo Ledger completed on 03/06/2012.  2. Systemic chemotherapy with carboplatin for AUC of 6 on day 1 and Abraxane 100 mg/M2 on days 1, 8 and 15 every 3 weeks. Status post 2 cycles. From cycle 3 forward AUC will be decreased to 4.5 given on day 1 and the Abraxane will be decreased to 90 mg per meter squared on days 1, 8 and 15 every 3 weeks, Status post a total of 3 cycles.  3. Systemic chemotherapy with carboplatin for AUC of 5 on day 1 and gemcitabine 1000 mg/m2 given on day 1 and day 8 every 3 weeks,status post 1 cycle. Due to significant neutropenia she will be dosed reduced beginning cycle 2 forward to carboplatin at an AUC of 4 given on day 1 and gemcitabine at 800 mg per meter squared given on days 1 and 8 every 3 weeks. Status post 6 cycles.  4. Status post palliative radiotherapy to the right hilum under the care of Dr. Pablo Ledger completed on 11/26/2013.  CURRENT THERAPY: Observation.  CHEMOTHERAPY INTENT: Palliative  CURRENT # OF CHEMOTHERAPY CYCLES: 0  CURRENT ANTIEMETICS: Compazine  CURRENT SMOKING STATUS: Former smoker  ORAL CHEMOTHERAPY AND CONSENT: None  CURRENT BISPHOSPHONATES USE: None  PAIN MANAGEMENT: 5/10 right shoulder currently on Norco  NARCOTICS INDUCED CONSTIPATION: None  LIVING WILL AND CODE STATUS: No CODE BLUE   INTERVAL HISTORY: Destiny Harrison 73 y.o. female returns to the clinic today for three-month followup visit accompanied by her son. The patient is feeling fine today with no specific complaints except for dry cough and shortness of breath at baseline and increased with exertion.  She was recently seen at [facility] and treated for COPD. She is currently on prednisone 5 mg by mouth daily. She is currently on Vicodin 5/325 mg by mouth every 4 hours as needed for pain.She denied having any significant weight loss or night sweats. She has no chest pain, but has shortness of breath increased with exertion and dry, cough with hemoptysis. The patient denied having any significant fever or chills, nausea or vomiting. She had repeat CT scan of the chest, abdomen and pelvis performed recently and she is here for evaluation and discussion of her scan results.  MEDICAL HISTORY: Past Medical History  Diagnosis Date  . COPD (chronic obstructive pulmonary disease)   . Neutropenia, drug-induced 05/05/2012  . Hyperlipidemia   . Rheumatoid arthritis(714.0)   . Asthma   . Hiatal hernia   . Non-small cell lung cancer dx'd 08/28/11    left lung  . History of chemotherapy   . History of radiation therapy 03/06/2012    left hilum  . History of radiation therapy 05/10/2013-05/31/2013    Left lung/ 33/75@2 .25 per fraction x 15 fractions  . Radiation 11/15/13-11/26/13    Right hilum 30 Gy in 10 fractions    ALLERGIES:  is allergic to shellfish allergy.  MEDICATIONS:  Current Outpatient Prescriptions  Medication Sig Dispense Refill  . albuterol (PROVENTIL HFA;VENTOLIN HFA) 108 (90 BASE) MCG/ACT inhaler Inhale 2 puffs into the lungs every 3 (three) hours as needed. WHEEZING AND SHORTNESS OF BREATH  1 Inhaler  5  .  albuterol (PROVENTIL) (2.5 MG/3ML) 0.083% nebulizer solution Take 2.5 mg by nebulization every 4 (four) hours as needed for wheezing or shortness of breath.      . calcium-vitamin D (OSCAL WITH D) 500-200 MG-UNIT per tablet Take 1 tablet by mouth daily.      . folic acid (FOLVITE) 845 MCG tablet Take 400 mcg by mouth daily.      Marland Kitchen HYDROcodone-acetaminophen (NORCO/VICODIN) 5-325 MG per tablet Take 1 tablet by mouth every 4 (four) hours as needed.  60 tablet  0  . ipratropium  (ATROVENT) 0.02 % nebulizer solution Take 0.25 mg by nebulization every 4 (four) hours as needed for wheezing.       . predniSONE (DELTASONE) 5 MG tablet Take 5 mg by mouth daily.       . simvastatin (ZOCOR) 5 MG tablet Take 5 mg by mouth at bedtime.       No current facility-administered medications for this visit.    SURGICAL HISTORY:  Past Surgical History  Procedure Laterality Date  . Appendex  1962  . Video bronchoscopy  01/28/2012    Procedure: VIDEO BRONCHOSCOPY WITHOUT FLUORO;  Surgeon: Brand Males, MD;  Location: [facility] ENDOSCOPY;  Service: Endoscopy;  Laterality: Bilateral;  . Surgery on right wrist      REVIEW OF SYSTEMS:  A comprehensive review of systems was negative except for: Respiratory: positive for cough and dyspnea on exertion Musculoskeletal: positive for back pain   PHYSICAL EXAMINATION: General appearance: alert, cooperative and no distress Head: Normocephalic, without obvious abnormality, atraumatic Neck: no adenopathy, no JVD, supple, symmetrical, trachea midline and thyroid not enlarged, symmetric, no tenderness/mass/nodules Lymph nodes: Cervical, supraclavicular, and axillary nodes normal. Resp: clear to auscultation bilaterally Back: symmetric, no curvature. ROM normal. No CVA tenderness. Cardio: regular rate and rhythm, S1, S2 normal, no murmur, click, rub or gallop GI: soft, non-tender; bowel sounds normal; no masses,  no organomegaly Extremities: extremities normal, atraumatic, no cyanosis or edema  ECOG PERFORMANCE STATUS: 1 - Symptomatic but completely ambulatory  There were no vitals taken for this visit.  LABORATORY DATA: Lab Results  Component Value Date   WBC 4.1 01/20/2014   HGB 10.6* 01/20/2014   HCT 31.9* 01/20/2014   MCV 78.8* 01/20/2014   PLT 271 01/20/2014      Chemistry      Component Value Date/Time   NA 139 01/20/2014 0750   NA 138 12/29/2013 0600   K 4.0 01/20/2014 0750   K 3.8 12/29/2013 0600   CL 100 12/29/2013 0600   CL 101 01/12/2013  0810   CO2 25 01/20/2014 0750   CO2 25 12/29/2013 0600   BUN 11.5 01/20/2014 0750   BUN 11 12/29/2013 0600   CREATININE 0.8 01/20/2014 0750   CREATININE 0.78 12/29/2013 0600      Component Value Date/Time   CALCIUM 10.2 01/20/2014 0750   CALCIUM 9.4 12/29/2013 0600   ALKPHOS 86 01/20/2014 0750   ALKPHOS 75 10/09/2012 1252   AST 14 01/20/2014 0750   AST 20 10/09/2012 1252   ALT 8 01/20/2014 0750   ALT 12 10/09/2012 1252   BILITOT 0.46 01/20/2014 0750   BILITOT 0.4 10/09/2012 1252       RADIOGRAPHIC STUDIES: Dg Chest 2 View (if Patient Has Fever And/or Copd)  12/29/2013   CLINICAL DATA Shortness of breath.  EXAM CHEST  2 VIEW  COMPARISON CT CHEST W/CM dated 10/26/2013  FINDINGS Cardiac silhouette is unremarkable, fullness of the pulmonary hila may reflect patient's known neoplasm and  scarring, similar to prior CT. No pleural effusions or focal consolidations. No pneumothorax.  Single lumen right chest Port-A-Cath with distal tip projecting in proximal superior vena cava. Soft tissue planes and included osseous structures are nonsuspicious, patient is osteopenic.  IMPRESSION No acute cardiopulmonary process.  SIGNATURE  Electronically Signed   By: Elon Alas   On: 12/29/2013 01:51   Ct Angio Chest Pe W/cm &/or Wo Cm  12/29/2013   CLINICAL DATA Shortness of breath, tachycardia, history of lung cancer per  EXAM CT ANGIOGRAPHY CHEST WITH CONTRAST  TECHNIQUE Multidetector CT imaging of the chest was performed using the standard protocol during bolus administration of intravenous contrast. Multiplanar CT image reconstructions and MIPs were obtained to evaluate the vascular anatomy.  CONTRAST 125mL OMNIPAQUE IOHEXOL 350 MG/ML SOLN  COMPARISON 12/29/2013 radiograph, 10/25/2013 CT  FINDINGS The contrast bolus timing is not optimized to evaluate pulmonary embolism with even central vessels on the right poorly opacified. Within this limitation, no pulmonary arterial branch filling defect is identified.  Normal  caliber aorta with scattered atherosclerotic disease.  High right paratracheal node measures 1.5 cm short axis, similar to prior. Right chest wall Port-A-Cath with catheter tip in the mid SVC. Normal heart size. No pleural or pericardial effusion.  The right middle lobe/perihilar nodular density seen previously and measures smaller at 1 cm as compared to 2.1 cm on the prior. Left paramediastinal opacities are in keeping with radiation change. Biapical scarring. Emphysema. No pneumothorax.  Calcified granuloma within the liver. Otherwise, upper abdominal images show nothing acute.  Osteopenia. Interval compression deformity with 25% height loss at T5. No retropulsion.  Review of the MIP images confirms the above findings.  IMPRESSION Suboptimal contrast bolus timing. No pulmonary embolism identified within this limitation. If clinical concern remains high, recommend a repeat exam or V/Q scan.  Interval T5 compression fracture with 25% height loss. No retropulsion. Background osteopenia. No definite osseous lesion. Attention on follow-up.  Right middle lobe/perihilar nodular density has decreased in size.  Left paramediastinal consolidation/radiation changes and prominent high right paratracheal lymph node, similar to prior.  SIGNATURE  Electronically Signed   By: Carlos Levering M.D.   On: 12/29/2013 21:09   Ct Abdomen Pelvis W Contrast  01/20/2014   CLINICAL DATA:  Non small cell lung cancer with the metastasis diagnosed 2012. Radiation therapy symmetric therapy complete.  EXAM: CT ABDOMEN AND PELVIS WITH CONTRAST  TECHNIQUE: Multidetector CT imaging of the abdomen and pelvis was performed using the standard protocol following bolus administration of intravenous contrast.  CONTRAST:  141mL OMNIPAQUE IOHEXOL 300 MG/ML  SOLN  COMPARISON:  CT ABD/PELVIS W CM dated 07/20/2013; NM PET IMAGE RESTAG (PS) SKULL BASE TO THIGH dated 02/20/2012  FINDINGS: Lung bases are clear.  No pericardial fluid.  No focal hepatic lesion.  The gallbladder, pancreas, spleen, adrenal glands, and kidneys are normal.  The stomach, small bowel, and cecum are normal. The colon and rectosigmoid colon are normal.  Abdominal aorta is normal caliber. No retroperitoneal periportal lymphadenopathy.  No free fluid the pelvis. Uterus contains a benign calcification. Bladder is normal. No pelvic lymphadenopathy.  There is mixed sclerotic and lytic lesion within the proximal right femur with thickened trabeculae. No change  IMPRESSION: 1. No evidence of lung cancer metastasis within the abdomen or pelvis. 2. 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   Electronically Signed   By: Suzy Bouchard M.D.   On: 01/20/2014 10:21    ASSESSMENT AND PLAN: This  is a very pleasant 73 years old [place] American female with metastatic non-small cell lung cancer, squamous cell carcinoma status post several chemotherapy regimen and and recently completed a course of palliative radiotherapy to the right hilum. She is currently on observation.  Her recent CT scan of the chest, abdomen and pelvis showed no evidence for disease progression but actually decrease in the right hilar mass. I discussed the scan results with the patient and her son and recommended for her to continue on observation. For the dry, COPD, I will start the patient on Medrol Dosepak and she will continue off her that with prednisone 5 mg by mouth daily as previously ordered by her pulmonologist. She would have repeat CT scan of the chest, abdomen and pelvis in 3 months and she would come back for followup visit at that time. She was advised to call immediately if she has any concerning symptoms in the interval.  The patient voices understanding of current disease status and treatment options and is in agreement with the current care plan.  All questions were answered. The patient knows to call the clinic with any problems, questions or concerns. We can certainly  see the patient much sooner if necessary.  Disclaimer: This note was dictated with voice recognition software. Similar sounding words can inadvertently be transcribed and may not be corrected upon review.

## 2014-02-01 ENCOUNTER — Encounter: Payer: Self-pay | Admitting: Internal Medicine

## 2014-02-01 ENCOUNTER — Ambulatory Visit (INDEPENDENT_AMBULATORY_CARE_PROVIDER_SITE_OTHER): Payer: Medicare Other | Admitting: Internal Medicine

## 2014-02-01 VITALS — BP 110/64 | HR 125 | Ht 65.0 in | Wt 137.0 lb

## 2014-02-01 DIAGNOSIS — R059 Cough, unspecified: Secondary | ICD-10-CM

## 2014-02-01 DIAGNOSIS — R05 Cough: Secondary | ICD-10-CM

## 2014-02-01 DIAGNOSIS — J449 Chronic obstructive pulmonary disease, unspecified: Secondary | ICD-10-CM | POA: Diagnosis not present

## 2014-02-01 DIAGNOSIS — R053 Chronic cough: Secondary | ICD-10-CM

## 2014-02-01 MED ORDER — FLUTICASONE PROPIONATE 50 MCG/ACT NA SUSP: 2.0000 | Freq: Every day | NASAL | Status: DC

## 2014-02-01 NOTE — Patient Instructions (Signed)
#  COPD  - stable diseaese  - continue duoneb 4 times daily - use albuterol HFA as needed; call insurance to see what brand is cheaper -Flu Shot in the fall  #Cough This  is probably related to COPD and lung cancer But could be from sinus drainage and acid reflux as well Try and START over-the-counter Prilosec 20 mg once a day in the morning on an empty stomach x 30 days STart generic fluticasone inhaler 2 squirts each nostril daily x 30 days Call in 1 month with response of above to cough IF not responding, then can  Consider syrup of  Morphine  #FOllowup  - call 1 in month to report  Cough progress with sinus and acid reflux treatment  - for copd: 6 months or sooner if needed

## 2014-02-01 NOTE — Progress Notes (Signed)
Subjective:    Patient ID: Destiny Harrison, female    DOB: 1940-11-02, 73 y.o.   MRN: [id number]  HPI #Ex-smoker: quit smoking about 2 years ago but had 20 pack smokng hx.    # RA - from age 72. Dr Amil Amen. Prednisone and Methotrexate  #COPD - moderate   -  PFTs - Gold stage 2 copd with asthma component. FEv1 1L/54%., 14% BD response on FVC, ratio 54, DLCO 42%  #NSCLC - diagnosed and  metastatic 2013 with rib lesion   - CT Chest 02/20/12 Left upper lobe endobronchial mass with hypermetabolic adenopathy extending to the contralateral mediastinum and deep to the right clavicular head, all consistent with primary bronchogenic carcinoma. Left eleventh rib lesion is highly worrisome for a metastatic lesion.   1. Status post palliative radiotherapy to the left lung mass under the care of Dr. Pablo Ledger completed on 03/06/2012.  2. Systemic chemotherapy with carboplatin for AUC of 6 on day 1 and Abraxane 100 mg/M2 on days 1, 8 and 15 every 3 weeks. Status post 2 cycles. From cycle 3 forward AUC will be decreased to 4.5 given on day 1 and the Abraxane will be decreased to 90 mg per meter squared on days 1, 8 and 15 every 3 weeks, Status post a total of 3 cycles.  3. Systemic chemotherapy with carboplatin for AUC of 5 on day 1 and gemcitabine 1000 mg/m2 given on day 1 and day 8 every 3 weeks,status post 1 cycle. Due to significant neutropenia she will be dosed reduced beginning cycle 2 forward to carboplatin at an AUC of 4 given on day 1 and gemcitabine at 800 mg per meter squared given on days 1 and 8 every 3 weeks. Status post 6 cycles.  4. Status post palliative radiotherapy to the right hilum under the care of Dr. Pablo Ledger completed on 11/26/2013. 5.    OV 11/10/2012  Followup for COPD  COPD - doing well. Overall COPD and ESAS shows huge improviement; see below. In terms of her cancer she has undergone chemotherapy and by her history she is on observation she is due for a CT scan in March 2014.  Past, Family, Social reviewed: no change since last visit  REC #COPD  continue medications and follow [facility] med calendar  Any problems come sooner  Continue regular treadmill at home.  In addition do some light kettlebell exercises at home (2-5#), do 15 - 25 reps x 3 sets each time x 3 per week  - the specific exercise is called kettlebell swing. You can look this up in youtube. Be careful and do not overdo  - this is good for your core strength  #Followup  REturn to see me in 6 Months or sooner if needed   OV 06/29/2013  Followup for Gold stage II COPD last seen January 2014  - In terms of COPD she is doing well. Lung health is stable. COPD cat score is 17 and although worse from prior visit seems to be in the range of baseline. She is doing DuoNeb through her [employer] [facility]. She is complaining of the high cost of albuterol MDI. She has not had her flu shot but will have it today.  -Past medical history reviewed: June 2014 CT scan of the chest showed some recurrence of cancer in the mediastinum and she's had radiation therapy palliative according to her history  #COPD  - stable diseaese  - continue duoneb 4 times daily - use albuterol HFA as  needed; call insurance to see what brand is cheaper - flu shot 06/29/2013  REturn to see me in 6  Months or sooner if needed  OV 02/01/2014  Chief Complaint  Patient presents with  . Follow-up    Pt states breathing is unchanged. C/o SOB with activity, cough with clear mucous, pt states these symptoms are "normal" for her. Denies CP. Pt recently finished pred and abx from her oncologist.     Followup for COPD.  Overall COPD stable. She is compliant with her COPD nebulizers. There is no new problems. In terms of her malignancy: CT scan of the chest April 2015 showed improvement in her right hilar mass after her radiation in February. She did see Dr. Julien Nordmann on 01/25/2014 and has been placed on continued followup. Her main symptom  is that of chronic cough which is moderate in severity and dry. She says that Dr. Julien Nordmann has tried steroid and antibiotic courses but this has not helped. She feels that she can just live with the symptoms but if these can be controlled easily she would prefer that. She's never tried any sinus drainage or acid reflux therapies for the same but denies that these are problems. She is willing to give an empiric course of nasal steroids and acid reflux treatment a trial   CAT COPD Symptom and Quality of Life Score ([employer] trademark)  June 2013 07/23/2012  11/10/2012  06/29/2013   Never Cough -> Cough all the time 4 5 2 3   No phlegm in chest -> Chest is full of phlegm 4 0 0 0-  No chest tightness -> Chest feels very tight 4 4 2 2   No dyspnea for 1 flight stairs/hill -> Very dyspneic for 1 flight of stairs 4 5 0 3  No limitations for ADL at home -> Very limited with ADL at home 3 5 0 2  Confident leaving home -> Not at all confident leaving home 3 2 0 3  Sleep soundly -> Do not sleep soundly because of lung condition 5 5 0 2  Lots of Energy -> No energy at all 4 3 1 2   TOTAL Score (max 40)  31 29 5 17        Review of Systems  Constitutional: Negative for fever and unexpected weight change.  HENT: Negative for congestion, dental problem, ear pain, nosebleeds, postnasal drip, rhinorrhea, sinus pressure, sneezing, sore throat and trouble swallowing.   Eyes: Negative for redness and itching.  Respiratory: Positive for cough, chest tightness and shortness of breath. Negative for wheezing.   Cardiovascular: Negative for palpitations and leg swelling.  Gastrointestinal: Positive for nausea. Negative for vomiting.  Genitourinary: Negative for dysuria.  Musculoskeletal: Negative for joint swelling.  Skin: Negative for rash.  Neurological: Negative for headaches.  Hematological: Does not bruise/bleed easily.  Psychiatric/Behavioral: Negative for dysphoric mood. The patient is not  nervous/anxious.    Current outpatient prescriptions:albuterol (PROVENTIL HFA;VENTOLIN HFA) 108 (90 BASE) MCG/ACT inhaler, Inhale 2 puffs into the lungs every 3 (three) hours as needed. WHEEZING AND SHORTNESS OF BREATH, Disp: 1 Inhaler, Rfl: 5;  albuterol (PROVENTIL) (2.5 MG/3ML) 0.083% nebulizer solution, Take 2.5 mg by nebulization every 4 (four) hours as needed for wheezing or shortness of breath., Disp: , Rfl:  calcium-vitamin D (OSCAL WITH D) 500-200 MG-UNIT per tablet, Take 1 tablet by mouth daily., Disp: , Rfl: ;  folic acid (FOLVITE) 144 MCG tablet, Take 400 mcg by mouth daily., Disp: , Rfl: ;  HYDROcodone-acetaminophen (NORCO/VICODIN) 5-325  MG per tablet, Take 1 tablet by mouth every 4 (four) hours as needed., Disp: 60 tablet, Rfl: 0 ipratropium (ATROVENT) 0.02 % nebulizer solution, Take 0.25 mg by nebulization every 4 (four) hours as needed for wheezing. , Disp: , Rfl: ;  predniSONE (DELTASONE) 5 MG tablet, Take 5 mg by mouth daily. , Disp: , Rfl: ;  simvastatin (ZOCOR) 5 MG tablet, Take 5 mg by mouth at bedtime., Disp: , Rfl:      Objective:   Physical Exam   Physical Exam   Filed Vitals:   02/01/14 1520  BP: 110/64  Pulse: 125  Height: 5\' 5"  (1.651 m)  Weight: 62.143 kg (137 lb)  SpO2: 97%      Constitutional: She is oriented to person, place, and time. She appears well-developed and well-nourished. No distress.  Body mass index is 22.8 kg/(m^2).    HENT:  Head: Normocephalic and atraumatic.  Right Ear: External ear normal.  Left Ear: External ear normal.  Mouth/Throat: Oropharynx is clear and moist. No oropharyngeal exudate.  Eyes: Conjunctivae and EOM are normal. Pupils are equal, round, and reactive to light. Right eye exhibits no discharge. Left eye exhibits no discharge. No scleral icterus.  Neck: Normal range of motion. Neck supple. No JVD present. No tracheal deviation present. No thyromegaly present.  Cardiovascular: Normal rate, regular rhythm, normal heart  sounds and intact distal pulses.  Exam reveals no gallop and no friction rub.   No murmur heard. Pulmonary/Chest: Effort normal and breath sounds normal. No respiratory distress. She has no wheezes. She has no rales. She exhibits no tenderness.  protocath +  Abdominal: Soft. Bowel sounds are normal. She exhibits no distension and no mass. There is no tenderness. There is no rebound and no guarding.  Musculoskeletal: Normal range of motion. She exhibits no edema and no tenderness.  Lymphadenopathy:    She has no cervical adenopathy.  Neurological: She is alert and oriented to person, place, and time. She has normal reflexes. No cranial nerve deficit. She exhibits normal muscle tone. Coordination normal.  Skin: Skin is warm and dry. No rash noted. She is not diaphoretic. No erythema. No pallor.  Psychiatric: She has a normal mood and affect. Her behavior is normal. Judgment and thought content normal.          Assessment & Plan:

## 2014-02-06 DIAGNOSIS — R05 Cough: Secondary | ICD-10-CM | POA: Insufficient documentation

## 2014-02-06 DIAGNOSIS — R053 Chronic cough: Secondary | ICD-10-CM | POA: Insufficient documentation

## 2014-02-06 NOTE — Assessment & Plan Note (Signed)
#  Cough This  is probably related to COPD and lung cancer But could be from sinus drainage and acid reflux as well Try and START over-the-counter Prilosec 20 mg once a day in the morning on an empty stomach x 30 days STart generic fluticasone inhaler 2 squirts each nostril daily x 30 days Call in 1 month with response of above to cough IF not responding, then can  Consider syrup of  Morphine

## 2014-02-06 NOTE — Assessment & Plan Note (Signed)
#  COPD  - stable diseaese  - continue duoneb 4 times daily - use albuterol HFA as needed; call insurance to see what brand is cheaper -Flu Shot in the fall #FOllowup  - call 1 in month to report  Cough progress with sinus and acid reflux treatment  - for copd: 6 months or sooner if needed

## 2014-02-22 ENCOUNTER — Telehealth: Payer: Self-pay | Admitting: Internal Medicine

## 2014-02-22 MED ORDER — ALBUTEROL SULFATE HFA 108 (90 BASE) MCG/ACT IN AERS: 2.0000 | INHALATION_SPRAY | RESPIRATORY_TRACT | Status: DC | PRN

## 2014-02-22 NOTE — Telephone Encounter (Signed)
Pt would like to know when this will be taken care of.  Destiny Harrison

## 2014-02-22 NOTE — Telephone Encounter (Signed)
LMOM x 1 

## 2014-02-22 NOTE — Telephone Encounter (Signed)
Pt has contacted insurance company Pt currently using Proventil, cannot afford. Was advised by insurance company to have Dr Chase Caller prescribe Albuterol Sulfate (Ventolin)- it is the cheaper medication that insurance is covering.  This has been sent to [place], [place] with no change in instructions.

## 2014-03-01 DIAGNOSIS — N390 Urinary tract infection, site not specified: Secondary | ICD-10-CM | POA: Diagnosis not present

## 2014-03-01 DIAGNOSIS — Z0000 Encounter for general adult medical examination without abnormal findings: Secondary | ICD-10-CM | POA: Diagnosis not present

## 2014-03-01 DIAGNOSIS — M899 Disorder of bone, unspecified: Secondary | ICD-10-CM | POA: Diagnosis not present

## 2014-03-01 DIAGNOSIS — M949 Disorder of cartilage, unspecified: Secondary | ICD-10-CM | POA: Diagnosis not present

## 2014-03-01 DIAGNOSIS — Z1331 Encounter for screening for depression: Secondary | ICD-10-CM | POA: Diagnosis not present

## 2014-03-01 DIAGNOSIS — E785 Hyperlipidemia, unspecified: Secondary | ICD-10-CM | POA: Diagnosis not present

## 2014-03-03 ENCOUNTER — Ambulatory Visit (HOSPITAL_BASED_OUTPATIENT_CLINIC_OR_DEPARTMENT_OTHER): Payer: Medicare Other

## 2014-03-03 VITALS — BP 136/70 | HR 90

## 2014-03-03 DIAGNOSIS — Z452 Encounter for adjustment and management of vascular access device: Secondary | ICD-10-CM

## 2014-03-03 DIAGNOSIS — C3410 Malignant neoplasm of upper lobe, unspecified bronchus or lung: Secondary | ICD-10-CM

## 2014-03-03 DIAGNOSIS — Z95828 Presence of other vascular implants and grafts: Secondary | ICD-10-CM

## 2014-03-03 MED ORDER — SODIUM CHLORIDE 0.9 % IJ SOLN
10.0000 mL | INTRAMUSCULAR | Status: DC | PRN
  Administered 2014-03-03: 10 mL via INTRAVENOUS
  Filled 2014-03-03: qty 10

## 2014-03-03 MED ORDER — HEPARIN SOD (PORK) LOCK FLUSH 100 UNIT/ML IV SOLN
500.0000 [IU] | Freq: Once | INTRAVENOUS | Status: AC
  Administered 2014-03-03: 500 [IU] via INTRAVENOUS
  Filled 2014-03-03: qty 5

## 2014-03-07 ENCOUNTER — Telehealth: Payer: Self-pay | Admitting: Internal Medicine

## 2014-03-07 NOTE — Telephone Encounter (Signed)
Per OV 02/01/14; Patient Instructions      #COPD  - stable diseaese  - continue duoneb 4 times daily - use albuterol HFA as needed; call insurance to see what brand is cheaper -Flu Shot in the fall #Cough This  is probably related to COPD and lung cancer But could be from sinus drainage and acid reflux as well Try and START over-the-counter Prilosec 20 mg once a day in the morning on an empty stomach x 30 days STart generic fluticasone inhaler 2 squirts each nostril daily x 30 days Call in 1 month with response of above to cough IF not responding, then can  Consider syrup of  Morphine #FOllowup  - call 1 in month to report  Cough progress with sinus and acid reflux treatment  - for copd: 6 months or sooner if needed  ---   Called spoke with pt. She reports her cough is about 60 percent better. She is still doing everything MR recommended. FYI for MR

## 2014-03-07 NOTE — Telephone Encounter (Signed)
Thanks for calling. Given fact cough is 60% better, will not add morphine. Will continue current plan. FU as previously planned

## 2014-03-24 DIAGNOSIS — G609 Hereditary and idiopathic neuropathy, unspecified: Secondary | ICD-10-CM | POA: Diagnosis not present

## 2014-03-24 DIAGNOSIS — E785 Hyperlipidemia, unspecified: Secondary | ICD-10-CM | POA: Diagnosis not present

## 2014-03-24 DIAGNOSIS — J45909 Unspecified asthma, uncomplicated: Secondary | ICD-10-CM | POA: Diagnosis not present

## 2014-03-24 DIAGNOSIS — M949 Disorder of cartilage, unspecified: Secondary | ICD-10-CM | POA: Diagnosis not present

## 2014-03-24 DIAGNOSIS — M899 Disorder of bone, unspecified: Secondary | ICD-10-CM | POA: Diagnosis not present

## 2014-03-24 DIAGNOSIS — J309 Allergic rhinitis, unspecified: Secondary | ICD-10-CM | POA: Diagnosis not present

## 2014-04-13 ENCOUNTER — Other Ambulatory Visit: Payer: Self-pay | Admitting: *Deleted

## 2014-04-13 DIAGNOSIS — C3490 Malignant neoplasm of unspecified part of unspecified bronchus or lung: Secondary | ICD-10-CM

## 2014-04-13 MED ORDER — HYDROCODONE-ACETAMINOPHEN 5-325 MG PO TABS: 1.0000 | ORAL_TABLET | ORAL | Status: DC | PRN

## 2014-04-14 ENCOUNTER — Ambulatory Visit (HOSPITAL_BASED_OUTPATIENT_CLINIC_OR_DEPARTMENT_OTHER): Payer: Medicare Other

## 2014-04-14 VITALS — BP 148/73 | HR 110 | Temp 97.4°F

## 2014-04-14 DIAGNOSIS — Z452 Encounter for adjustment and management of vascular access device: Secondary | ICD-10-CM

## 2014-04-14 DIAGNOSIS — Z95828 Presence of other vascular implants and grafts: Secondary | ICD-10-CM

## 2014-04-14 DIAGNOSIS — C3490 Malignant neoplasm of unspecified part of unspecified bronchus or lung: Secondary | ICD-10-CM

## 2014-04-14 MED ORDER — HEPARIN SOD (PORK) LOCK FLUSH 100 UNIT/ML IV SOLN
500.0000 [IU] | Freq: Once | INTRAVENOUS | Status: AC
  Administered 2014-04-14: 500 [IU] via INTRAVENOUS
  Filled 2014-04-14: qty 5

## 2014-04-14 MED ORDER — SODIUM CHLORIDE 0.9 % IJ SOLN
10.0000 mL | INTRAMUSCULAR | Status: DC | PRN
  Administered 2014-04-14: 10 mL via INTRAVENOUS
  Filled 2014-04-14: qty 10

## 2014-04-14 NOTE — Patient Instructions (Signed)

## 2014-04-25 ENCOUNTER — Telehealth: Payer: Self-pay | Admitting: Internal Medicine

## 2014-04-25 DIAGNOSIS — J449 Chronic obstructive pulmonary disease, unspecified: Secondary | ICD-10-CM

## 2014-04-25 MED ORDER — ALBUTEROL SULFATE (2.5 MG/3ML) 0.083% IN NEBU: 2.5000 mg | INHALATION_SOLUTION | RESPIRATORY_TRACT | Status: DC | PRN

## 2014-04-25 MED ORDER — IPRATROPIUM BROMIDE 0.02 % IN SOLN: 0.2500 mg | RESPIRATORY_TRACT | Status: DC | PRN

## 2014-04-25 NOTE — Telephone Encounter (Signed)
Pt calling requesting refills of Albuterol and Ipratropium  [facility] needing new Rx to be sent to them to refill this medication.  Rx given to MR to sign  Needs to be faxed to [facility] once signed.   Will send to [facility] to follow up on.

## 2014-04-26 ENCOUNTER — Telehealth: Payer: Self-pay | Admitting: Internal Medicine

## 2014-04-26 MED ORDER — IPRATROPIUM BROMIDE 0.02 % IN SOLN: 0.5000 mg | Freq: Four times a day (QID) | RESPIRATORY_TRACT | Status: DC

## 2014-04-26 MED ORDER — ALBUTEROL SULFATE (2.5 MG/3ML) 0.083% IN NEBU: 2.5000 mg | INHALATION_SOLUTION | Freq: Four times a day (QID) | RESPIRATORY_TRACT | Status: DC

## 2014-04-26 NOTE — Telephone Encounter (Signed)
Estill Bamberg from [facility] called. This is not their patient. We need to fax the order to [facility].

## 2014-04-26 NOTE — Telephone Encounter (Signed)
Yeah duoneb scheduled 4t/day is fine  Thanks  Dr. Brand Males, M.D., [facility].C.P Pulmonary and Critical Care Medicine Staff Physician [place] [facility] Pager: [phone], If no answer or between  15:00h - 7:00h: call [phone]  04/26/2014 5:25 PM

## 2014-04-26 NOTE — Telephone Encounter (Signed)
i called the pharmacy for [facility] and they stated that the rx that they received they are not covered by the pts insurance since they both say as needed.  i called the pharmacy to change this and they pharmacist had a question about the atrovent.  She stated that the atrovent before was 1 vial, and the new rx was for 1/2 a vial.  She stated that the pt will have to mix the albuterol and atrovent togethter each time since the dose is so small for the atrovent, or can we change to the duoneb for the pt and she will only have to use the 1 vial.  MR please advise. Thanks  Allergies  Allergen Reactions  . Shellfish Allergy Swelling    Current Outpatient Prescriptions on File Prior to Visit  Medication Sig Dispense Refill  . albuterol (PROVENTIL HFA;VENTOLIN HFA) 108 (90 BASE) MCG/ACT inhaler Inhale 2 puffs into the lungs every 3 (three) hours as needed. WHEEZING AND SHORTNESS OF BREATH  1 Inhaler  5  . albuterol (PROVENTIL) (2.5 MG/3ML) 0.083% nebulizer solution Take 3 mLs (2.5 mg total) by nebulization every 4 (four) hours as needed for wheezing or shortness of breath.  75 mL  11  . albuterol (VENTOLIN HFA) 108 (90 BASE) MCG/ACT inhaler Inhale 2 puffs into the lungs every 3 (three) hours as needed for wheezing or shortness of breath.  1 Inhaler  6  . calcium-vitamin D (OSCAL WITH D) 500-200 MG-UNIT per tablet Take 1 tablet by mouth daily.      . fluticasone (FLONASE) 50 MCG/ACT nasal spray Place 2 sprays into both nostrils daily.  16 g  2  . folic acid (FOLVITE) 480 MCG tablet Take 400 mcg by mouth daily.      Marland Kitchen HYDROcodone-acetaminophen (NORCO/VICODIN) 5-325 MG per tablet Take 1 tablet by mouth every 4 (four) hours as needed.  60 tablet  0  . ipratropium (ATROVENT) 0.02 % nebulizer solution Take 1.25 mLs (0.25 mg total) by nebulization every 4 (four) hours as needed for wheezing.  75 mL  11  . predniSONE (DELTASONE) 5 MG tablet Take 5 mg by mouth daily.       . simvastatin (ZOCOR) 5 MG tablet Take 5 mg by  mouth at bedtime.       No current facility-administered medications on file prior to visit.

## 2014-04-26 NOTE — Telephone Encounter (Signed)
Called spoke with [facility]. Gave VO. Nothing further needed

## 2014-04-26 NOTE — Telephone Encounter (Signed)
Lydia Guiles called back and stated that this is not their pt and the rx for medications will need to be sent to [facility]>  thanks

## 2014-04-26 NOTE — Telephone Encounter (Signed)
Order placed to [facility] for neb solution refill. Rx signed and faxed to [facility]. Nothing further needed.

## 2014-04-28 ENCOUNTER — Encounter (HOSPITAL_COMMUNITY): Payer: Self-pay

## 2014-04-28 ENCOUNTER — Other Ambulatory Visit (HOSPITAL_BASED_OUTPATIENT_CLINIC_OR_DEPARTMENT_OTHER): Payer: Medicare Other

## 2014-04-28 ENCOUNTER — Ambulatory Visit (HOSPITAL_COMMUNITY)
Admission: RE | Admit: 2014-04-28 | Discharge: 2014-04-28 | Disposition: A | Discharge status: Home / Self Care | Payer: Medicare Other | Source: Ambulatory Visit | Attending: Internal Medicine | Admitting: Internal Medicine

## 2014-04-28 DIAGNOSIS — R911 Solitary pulmonary nodule: Secondary | ICD-10-CM | POA: Diagnosis not present

## 2014-04-28 DIAGNOSIS — R0602 Shortness of breath: Secondary | ICD-10-CM | POA: Insufficient documentation

## 2014-04-28 DIAGNOSIS — K449 Diaphragmatic hernia without obstruction or gangrene: Secondary | ICD-10-CM | POA: Diagnosis not present

## 2014-04-28 DIAGNOSIS — M889 Osteitis deformans of unspecified bone: Secondary | ICD-10-CM | POA: Insufficient documentation

## 2014-04-28 DIAGNOSIS — C3410 Malignant neoplasm of upper lobe, unspecified bronchus or lung: Secondary | ICD-10-CM

## 2014-04-28 DIAGNOSIS — I700 Atherosclerosis of aorta: Secondary | ICD-10-CM | POA: Insufficient documentation

## 2014-04-28 DIAGNOSIS — C3490 Malignant neoplasm of unspecified part of unspecified bronchus or lung: Secondary | ICD-10-CM | POA: Diagnosis not present

## 2014-04-28 DIAGNOSIS — N289 Disorder of kidney and ureter, unspecified: Secondary | ICD-10-CM | POA: Diagnosis not present

## 2014-04-28 LAB — CBC WITH DIFFERENTIAL/PLATELET
BASO%: 0.6 % (ref 0.0–2.0)
Basophils Absolute: 0 10*3/uL (ref 0.0–0.1)
EOS ABS: 0.2 10*3/uL (ref 0.0–0.5)
EOS%: 2.8 % (ref 0.0–7.0)
HCT: 29 % — ABNORMAL LOW (ref 34.8–46.6)
HEMOGLOBIN: 9.4 g/dL — AB (ref 11.6–15.9)
LYMPH#: 1.1 10*3/uL (ref 0.9–3.3)
LYMPH%: 18.4 % (ref 14.0–49.7)
MCH: 24.6 pg — ABNORMAL LOW (ref 25.1–34.0)
MCHC: 32.3 g/dL (ref 31.5–36.0)
MCV: 76 fL — AB (ref 79.5–101.0)
MONO#: 0.7 10*3/uL (ref 0.1–0.9)
MONO%: 11.1 % (ref 0.0–14.0)
NEUT%: 67.1 % (ref 38.4–76.8)
NEUTROS ABS: 4 10*3/uL (ref 1.5–6.5)
Platelets: 352 10*3/uL (ref 145–400)
RBC: 3.82 10*6/uL (ref 3.70–5.45)
RDW: 17.2 % — ABNORMAL HIGH (ref 11.2–14.5)
WBC: 6 10*3/uL (ref 3.9–10.3)

## 2014-04-28 LAB — COMPREHENSIVE METABOLIC PANEL (CC13)
ALBUMIN: 3.3 g/dL — AB (ref 3.5–5.0)
ALT: 9 U/L (ref 0–55)
ANION GAP: 8 meq/L (ref 3–11)
AST: 14 U/L (ref 5–34)
Alkaline Phosphatase: 70 U/L (ref 40–150)
BUN: 12.1 mg/dL (ref 7.0–26.0)
CALCIUM: 9.4 mg/dL (ref 8.4–10.4)
CHLORIDE: 104 meq/L (ref 98–109)
CO2: 26 meq/L (ref 22–29)
Creatinine: 0.8 mg/dL (ref 0.6–1.1)
Glucose: 95 mg/dl (ref 70–140)
POTASSIUM: 4 meq/L (ref 3.5–5.1)
Sodium: 138 mEq/L (ref 136–145)
Total Bilirubin: 0.5 mg/dL (ref 0.20–1.20)
Total Protein: 7.5 g/dL (ref 6.4–8.3)

## 2014-04-28 MED ORDER — IOHEXOL 300 MG/ML  SOLN
100.0000 mL | Freq: Once | INTRAMUSCULAR | Status: AC | PRN
  Administered 2014-04-28: 100 mL via INTRAVENOUS

## 2014-05-05 ENCOUNTER — Ambulatory Visit (HOSPITAL_BASED_OUTPATIENT_CLINIC_OR_DEPARTMENT_OTHER): Payer: Medicare Other | Admitting: Internal Medicine

## 2014-05-05 ENCOUNTER — Encounter: Payer: Self-pay | Admitting: Internal Medicine

## 2014-05-05 ENCOUNTER — Telehealth: Payer: Self-pay | Admitting: Internal Medicine

## 2014-05-05 VITALS — BP 129/71 | HR 87 | Temp 98.4°F | Resp 18 | Ht 65.0 in | Wt 132.9 lb

## 2014-05-05 DIAGNOSIS — R05 Cough: Secondary | ICD-10-CM | POA: Diagnosis not present

## 2014-05-05 DIAGNOSIS — Z87891 Personal history of nicotine dependence: Secondary | ICD-10-CM

## 2014-05-05 DIAGNOSIS — C3411 Malignant neoplasm of upper lobe, right bronchus or lung: Secondary | ICD-10-CM

## 2014-05-05 DIAGNOSIS — R0609 Other forms of dyspnea: Secondary | ICD-10-CM

## 2014-05-05 DIAGNOSIS — R059 Cough, unspecified: Secondary | ICD-10-CM

## 2014-05-05 DIAGNOSIS — R0989 Other specified symptoms and signs involving the circulatory and respiratory systems: Secondary | ICD-10-CM | POA: Diagnosis not present

## 2014-05-05 DIAGNOSIS — C3410 Malignant neoplasm of upper lobe, unspecified bronchus or lung: Secondary | ICD-10-CM

## 2014-05-05 DIAGNOSIS — R911 Solitary pulmonary nodule: Secondary | ICD-10-CM | POA: Diagnosis not present

## 2014-05-05 NOTE — Telephone Encounter (Signed)
gv adn rpinted appt sched adn avs for pt for Aug Sept and OCT...gv pt barium

## 2014-05-05 NOTE — Progress Notes (Signed)
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   PRIOR THERAPY:  1. Status post palliative radiotherapy to the left lung mass under the care of Dr. Pablo Ledger completed on 03/06/2012.  2. Systemic chemotherapy with carboplatin for AUC of 6 on day 1 and Abraxane 100 mg/M2 on days 1, 8 and 15 every 3 weeks. Status post 2 cycles. From cycle 3 forward AUC will be decreased to 4.5 given on day 1 and the Abraxane will be decreased to 90 mg per meter squared on days 1, 8 and 15 every 3 weeks, Status post a total of 3 cycles.  3. Systemic chemotherapy with carboplatin for AUC of 5 on day 1 and gemcitabine 1000 mg/m2 given on day 1 and day 8 every 3 weeks,status post 1 cycle. Due to significant neutropenia she will be dosed reduced beginning cycle 2 forward to carboplatin at an AUC of 4 given on day 1 and gemcitabine at 800 mg per meter squared given on days 1 and 8 every 3 weeks. Status post 6 cycles.  4. Status post palliative radiotherapy to the right hilum under the care of Dr. Pablo Ledger completed on 11/26/2013.  CURRENT THERAPY: Observation.  CHEMOTHERAPY INTENT: Palliative  CURRENT # OF CHEMOTHERAPY CYCLES: 0  CURRENT ANTIEMETICS: Compazine  CURRENT SMOKING STATUS: Former smoker  ORAL CHEMOTHERAPY AND CONSENT: None  CURRENT BISPHOSPHONATES USE: None  PAIN MANAGEMENT: 5/10 right shoulder currently on Norco  NARCOTICS INDUCED CONSTIPATION: None  LIVING WILL AND CODE STATUS: No CODE BLUE   INTERVAL HISTORY: Destiny Harrison 73 y.o. female returns to the clinic today for three-month followup visit accompanied by her son. The patient is feeling fine today with no specific complaints except for mild dry cough and shortness of breath at baseline and increased with  exertion. She denied having any significant weight loss or night sweats. She has no chest pain, but has shortness of breath increased with exertion and dry, cough with hemoptysis. The patient denied having any significant fever or chills, nausea or vomiting. She had repeat CT scan of the chest, abdomen and pelvis performed recently and she is here for evaluation and discussion of her scan results.  MEDICAL HISTORY: Past Medical History  Diagnosis Date  . COPD (chronic obstructive pulmonary disease)   . Neutropenia, drug-induced 05/05/2012  . Hyperlipidemia   . Rheumatoid arthritis(714.0)   . Asthma   . Hiatal hernia   . History of chemotherapy   . History of radiation therapy 03/06/2012    left hilum  . History of radiation therapy 05/10/2013-05/31/2013    Left lung/ 33/75@2 .25 per fraction x 15 fractions  . Radiation 11/15/13-11/26/13    Right hilum 30 Gy in 10 fractions  . Non-small cell lung cancer dx'd 08/28/11    left lung    ALLERGIES:  is allergic to shellfish allergy.  MEDICATIONS:  Current Outpatient Prescriptions  Medication Sig Dispense Refill  . albuterol (PROVENTIL) (2.5 MG/3ML) 0.083% nebulizer solution Take 3 mLs (2.5 mg total) by nebulization 4 (four) times daily.  75 mL  11  . albuterol (VENTOLIN HFA) 108 (90 BASE) MCG/ACT inhaler Inhale 2 puffs into the lungs every 3 (three) hours as needed for wheezing or shortness of breath.  1 Inhaler  6  . calcium-vitamin D (OSCAL WITH D) 500-200 MG-UNIT per tablet  Take 1 tablet by mouth daily.      . fluticasone (FLONASE) 50 MCG/ACT nasal spray Place 2 sprays into both nostrils daily.  16 g  2  . folic acid (FOLVITE) 277 MCG tablet Take 400 mcg by mouth daily.      Marland Kitchen HYDROcodone-acetaminophen (NORCO/VICODIN) 5-325 MG per tablet Take 1 tablet by mouth every 4 (four) hours as needed.  60 tablet  0  . ipratropium (ATROVENT) 0.02 % nebulizer solution Take 2.5 mLs (0.5 mg total) by nebulization 4 (four) times daily.  75 mL  11  . Naproxen  Sodium (ALEVE) 220 MG CAPS Take 1 capsule by mouth daily as needed.      . predniSONE (DELTASONE) 5 MG tablet Take 5 mg by mouth daily.       . simvastatin (ZOCOR) 5 MG tablet Take 5 mg by mouth at bedtime.       No current facility-administered medications for this visit.    SURGICAL HISTORY:  Past Surgical History  Procedure Laterality Date  . Appendex  1962  . Video bronchoscopy  01/28/2012    Procedure: VIDEO BRONCHOSCOPY WITHOUT FLUORO;  Surgeon: Brand Males, MD;  Location: [facility] ENDOSCOPY;  Service: Endoscopy;  Laterality: Bilateral;  . Surgery on right wrist      REVIEW OF SYSTEMS:  A comprehensive review of systems was negative except for: Respiratory: positive for cough and dyspnea on exertion Musculoskeletal: positive for back pain   PHYSICAL EXAMINATION: General appearance: alert, cooperative and no distress Head: Normocephalic, without obvious abnormality, atraumatic Neck: no adenopathy, no JVD, supple, symmetrical, trachea midline and thyroid not enlarged, symmetric, no tenderness/mass/nodules Lymph nodes: Cervical, supraclavicular, and axillary nodes normal. Resp: clear to auscultation bilaterally Back: symmetric, no curvature. ROM normal. No CVA tenderness. Cardio: regular rate and rhythm, S1, S2 normal, no murmur, click, rub or gallop GI: soft, non-tender; bowel sounds normal; no masses,  no organomegaly Extremities: extremities normal, atraumatic, no cyanosis or edema  ECOG PERFORMANCE STATUS: 1 - Symptomatic but completely ambulatory  Blood pressure 129/71, pulse 87, temperature 98.4 F (36.9 C), temperature source Oral, resp. rate 18, height 5\' 5"  (1.651 m), weight 132 lb 14.4 oz (60.283 kg).  LABORATORY DATA: Lab Results  Component Value Date   WBC 6.0 04/28/2014   HGB 9.4* 04/28/2014   HCT 29.0* 04/28/2014   MCV 76.0* 04/28/2014   PLT 352 04/28/2014      Chemistry      Component Value Date/Time   NA 138 04/28/2014 0925   NA 138 12/29/2013 0600   K 4.0 04/28/2014  0925   K 3.8 12/29/2013 0600   CL 100 12/29/2013 0600   CL 101 01/12/2013 0810   CO2 26 04/28/2014 0925   CO2 25 12/29/2013 0600   BUN 12.1 04/28/2014 0925   BUN 11 12/29/2013 0600   CREATININE 0.8 04/28/2014 0925   CREATININE 0.78 12/29/2013 0600      Component Value Date/Time   CALCIUM 9.4 04/28/2014 0925   CALCIUM 9.4 12/29/2013 0600   ALKPHOS 70 04/28/2014 0925   ALKPHOS 75 10/09/2012 1252   AST 14 04/28/2014 0925   AST 20 10/09/2012 1252   ALT 9 04/28/2014 0925   ALT 12 10/09/2012 1252   BILITOT 0.50 04/28/2014 0925   BILITOT 0.4 10/09/2012 1252       RADIOGRAPHIC STUDIES: Ct Chest W Contrast  04/28/2014   CLINICAL DATA:  Lung cancer diagnosed in November 2012. Radiation and chemotherapy completed. Shortness of breath.  EXAM: CT CHEST, ABDOMEN,  AND PELVIS WITH CONTRAST  TECHNIQUE: Multidetector CT imaging of the chest, abdomen and pelvis was performed following the standard protocol during bolus administration of intravenous contrast.  CONTRAST:  160mL OMNIPAQUE IOHEXOL 300 MG/ML  SOLN  COMPARISON:  Prior examinations 12/29/2013 and 10/26/2013.  FINDINGS:   CT CHEST FINDINGS  Right paratracheal nodal mass has not significantly changed, measuring 1.5 cm short axis on image number 12. Small subcarinal and hilar lymph nodes are stable. Small axillary lymph nodes are stable. There are stable calcifications within the AP window.  Right IJ Port-A-Cath tip extends to the lower SVC. There is stable atherosclerosis of the aorta, great vessels and coronary arteries. The heart size is normal. There is no pleural or pericardial effusion. A small hiatal hernia appears unchanged.  There is a spiculated right middle lobe nodule measuring 2.0 x 1.4 cm on image 26. Compared with the most recent study, this has enlarged, although is similar in size to the study performed 4 months ago. This finding remains worrisome for a metachronous primary lung cancer. No other focal pulmonary nodules are identified. There is stable  radiation change within the left lung medially and stable biapical pulmonary scarring. Diffuse emphysematous changes are noted.  T4 compression deformity appears stable. No new osseous lesions are identified.    CT ABDOMEN AND PELVIS FINDINGS  The liver, gallbladder, biliary system and pancreas appear normal. There is no adrenal mass. The spleen appears normal. Both kidneys appear stable with a tiny low-density lesion in the interpolar region of the left kidney (image 53). There is no hydronephrosis.  There are no enlarged abdominal pelvic lymph nodes. There is no ascites or peritoneal nodularity. The stomach, small bowel and colon appear normal. The uterus, ovaries and bladder appear normal.  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 No worrisome osseous findings demonstrated.    IMPRESSION: 1. Persistent spiculated right middle lobe nodule, enlarged from most recent study and remaining concerning for metachronous lung cancer. 2. Enlarged right paratracheal node has not significantly changed from prior studies, although remains concerning for possible residual disease. Consider further evaluation of this and the middle lobe nodule with PET-CT. 3. No other evidence of metastatic disease.   Electronically Signed   By: Camie Patience M.D.   On: 04/28/2014 14:27   ASSESSMENT AND PLAN: This is a very pleasant 73 years old [place] American female with metastatic non-small cell lung cancer, squamous cell carcinoma status post several chemotherapy regimen and and recently completed a course of palliative radiotherapy to the right hilum. She is currently on observation.  Her recent CT scan of the chest, abdomen and pelvis showed no evidence for disease progression except for slightly enlarged right middle lobe nodule. I discussed the scan results with the patient and her son. I gave her the option of continuous observation and close monitoring versus consideration of immunotherapy  or chemotherapy.  The patient would like to continue on observation for now. She would have repeat CT scan of the chest, abdomen and pelvis in 3 months and she would come back for followup visit at that time. She was advised to call immediately if she has any concerning symptoms in the interval.  The patient voices understanding of current disease status and treatment options and is in agreement with the current care plan.  All questions were answered. The patient knows to call the clinic with any problems, questions or concerns. We can certainly see the patient much sooner if necessary.  Disclaimer: This note was dictated with voice recognition software. Similar sounding words can inadvertently be transcribed and may not be corrected upon review.

## 2014-05-25 ENCOUNTER — Other Ambulatory Visit: Payer: Self-pay | Admitting: *Deleted

## 2014-05-25 DIAGNOSIS — C3490 Malignant neoplasm of unspecified part of unspecified bronchus or lung: Secondary | ICD-10-CM

## 2014-05-25 MED ORDER — HYDROCODONE-ACETAMINOPHEN 5-325 MG PO TABS: 1.0000 | ORAL_TABLET | ORAL | Status: DC | PRN

## 2014-05-26 ENCOUNTER — Ambulatory Visit (HOSPITAL_BASED_OUTPATIENT_CLINIC_OR_DEPARTMENT_OTHER): Payer: Medicare Other

## 2014-05-26 VITALS — BP 137/75 | HR 120

## 2014-05-26 DIAGNOSIS — Z452 Encounter for adjustment and management of vascular access device: Secondary | ICD-10-CM | POA: Diagnosis not present

## 2014-05-26 DIAGNOSIS — Z95828 Presence of other vascular implants and grafts: Secondary | ICD-10-CM

## 2014-05-26 DIAGNOSIS — C3410 Malignant neoplasm of upper lobe, unspecified bronchus or lung: Secondary | ICD-10-CM | POA: Diagnosis not present

## 2014-05-26 MED ORDER — SODIUM CHLORIDE 0.9 % IJ SOLN
10.0000 mL | INTRAMUSCULAR | Status: DC | PRN
  Administered 2014-05-26: 10 mL via INTRAVENOUS
  Filled 2014-05-26: qty 10

## 2014-05-26 MED ORDER — HEPARIN SOD (PORK) LOCK FLUSH 100 UNIT/ML IV SOLN
500.0000 [IU] | Freq: Once | INTRAVENOUS | Status: AC
Start: 2014-05-26 — End: 2014-05-26
  Administered 2014-05-26: 500 [IU] via INTRAVENOUS
  Filled 2014-05-26: qty 5

## 2014-06-24 ENCOUNTER — Emergency Department (HOSPITAL_COMMUNITY): Payer: Medicare Other

## 2014-06-24 ENCOUNTER — Encounter (HOSPITAL_COMMUNITY): Payer: Self-pay | Admitting: Emergency Medicine

## 2014-06-24 ENCOUNTER — Observation Stay (HOSPITAL_COMMUNITY)
Admission: EM | Admit: 2014-06-24 | Discharge: 2014-06-25 | Disposition: A | Discharge status: Home / Self Care | Payer: Medicare Other | Attending: Internal Medicine | Admitting: Internal Medicine

## 2014-06-24 ENCOUNTER — Observation Stay (HOSPITAL_COMMUNITY): Payer: Medicare Other

## 2014-06-24 DIAGNOSIS — Z91013 Allergy to seafood: Secondary | ICD-10-CM | POA: Insufficient documentation

## 2014-06-24 DIAGNOSIS — I498 Other specified cardiac arrhythmias: Secondary | ICD-10-CM | POA: Diagnosis not present

## 2014-06-24 DIAGNOSIS — R222 Localized swelling, mass and lump, trunk: Secondary | ICD-10-CM | POA: Diagnosis not present

## 2014-06-24 DIAGNOSIS — R05 Cough: Secondary | ICD-10-CM | POA: Diagnosis not present

## 2014-06-24 DIAGNOSIS — Z79899 Other long term (current) drug therapy: Secondary | ICD-10-CM | POA: Diagnosis not present

## 2014-06-24 DIAGNOSIS — R509 Fever, unspecified: Secondary | ICD-10-CM | POA: Diagnosis present

## 2014-06-24 DIAGNOSIS — C3490 Malignant neoplasm of unspecified part of unspecified bronchus or lung: Secondary | ICD-10-CM | POA: Insufficient documentation

## 2014-06-24 DIAGNOSIS — R0989 Other specified symptoms and signs involving the circulatory and respiratory systems: Secondary | ICD-10-CM

## 2014-06-24 DIAGNOSIS — R079 Chest pain, unspecified: Secondary | ICD-10-CM | POA: Insufficient documentation

## 2014-06-24 DIAGNOSIS — R059 Cough, unspecified: Secondary | ICD-10-CM | POA: Insufficient documentation

## 2014-06-24 DIAGNOSIS — J45909 Unspecified asthma, uncomplicated: Secondary | ICD-10-CM | POA: Insufficient documentation

## 2014-06-24 DIAGNOSIS — E785 Hyperlipidemia, unspecified: Secondary | ICD-10-CM | POA: Insufficient documentation

## 2014-06-24 DIAGNOSIS — J449 Chronic obstructive pulmonary disease, unspecified: Secondary | ICD-10-CM

## 2014-06-24 DIAGNOSIS — M2550 Pain in unspecified joint: Secondary | ICD-10-CM | POA: Diagnosis not present

## 2014-06-24 DIAGNOSIS — R0600 Dyspnea, unspecified: Secondary | ICD-10-CM

## 2014-06-24 DIAGNOSIS — N390 Urinary tract infection, site not specified: Principal | ICD-10-CM | POA: Insufficient documentation

## 2014-06-24 DIAGNOSIS — D630 Anemia in neoplastic disease: Secondary | ICD-10-CM | POA: Diagnosis not present

## 2014-06-24 DIAGNOSIS — M069 Rheumatoid arthritis, unspecified: Secondary | ICD-10-CM | POA: Diagnosis not present

## 2014-06-24 DIAGNOSIS — IMO0002 Reserved for concepts with insufficient information to code with codable children: Secondary | ICD-10-CM | POA: Diagnosis not present

## 2014-06-24 DIAGNOSIS — R0609 Other forms of dyspnea: Secondary | ICD-10-CM

## 2014-06-24 DIAGNOSIS — Z9221 Personal history of antineoplastic chemotherapy: Secondary | ICD-10-CM | POA: Insufficient documentation

## 2014-06-24 DIAGNOSIS — D649 Anemia, unspecified: Secondary | ICD-10-CM | POA: Diagnosis present

## 2014-06-24 DIAGNOSIS — Z923 Personal history of irradiation: Secondary | ICD-10-CM | POA: Insufficient documentation

## 2014-06-24 DIAGNOSIS — R053 Chronic cough: Secondary | ICD-10-CM

## 2014-06-24 DIAGNOSIS — C3411 Malignant neoplasm of upper lobe, right bronchus or lung: Secondary | ICD-10-CM | POA: Diagnosis present

## 2014-06-24 DIAGNOSIS — R000 Tachycardia, unspecified: Secondary | ICD-10-CM | POA: Diagnosis present

## 2014-06-24 LAB — URINALYSIS, ROUTINE W REFLEX MICROSCOPIC
Bilirubin Urine: NEGATIVE
GLUCOSE, UA: NEGATIVE mg/dL
HGB URINE DIPSTICK: NEGATIVE
Ketones, ur: NEGATIVE mg/dL
Nitrite: NEGATIVE
Protein, ur: NEGATIVE mg/dL
Specific Gravity, Urine: 1.007 (ref 1.005–1.030)
Urobilinogen, UA: 0.2 mg/dL (ref 0.0–1.0)
pH: 7.5 (ref 5.0–8.0)

## 2014-06-24 LAB — CBC WITH DIFFERENTIAL/PLATELET
BASOS PCT: 0 % (ref 0–1)
Basophils Absolute: 0 10*3/uL (ref 0.0–0.1)
EOS PCT: 1 % (ref 0–5)
Eosinophils Absolute: 0.1 10*3/uL (ref 0.0–0.7)
HEMATOCRIT: 30.2 % — AB (ref 36.0–46.0)
HEMOGLOBIN: 9.9 g/dL — AB (ref 12.0–15.0)
Lymphocytes Relative: 12 % (ref 12–46)
Lymphs Abs: 0.9 10*3/uL (ref 0.7–4.0)
MCH: 23.5 pg — ABNORMAL LOW (ref 26.0–34.0)
MCHC: 32.8 g/dL (ref 30.0–36.0)
MCV: 71.6 fL — AB (ref 78.0–100.0)
MONO ABS: 0.6 10*3/uL (ref 0.1–1.0)
Monocytes Relative: 8 % (ref 3–12)
Neutro Abs: 6.3 10*3/uL (ref 1.7–7.7)
Neutrophils Relative %: 79 % — ABNORMAL HIGH (ref 43–77)
Platelets: 383 10*3/uL (ref 150–400)
RBC: 4.22 MIL/uL (ref 3.87–5.11)
RDW: 16.3 % — ABNORMAL HIGH (ref 11.5–15.5)
WBC: 7.9 10*3/uL (ref 4.0–10.5)

## 2014-06-24 LAB — COMPREHENSIVE METABOLIC PANEL
ALBUMIN: 3.3 g/dL — AB (ref 3.5–5.2)
ALT: 7 U/L (ref 0–35)
ANION GAP: 15 (ref 5–15)
AST: 14 U/L (ref 0–37)
Alkaline Phosphatase: 75 U/L (ref 39–117)
BUN: 13 mg/dL (ref 6–23)
CALCIUM: 9.6 mg/dL (ref 8.4–10.5)
CHLORIDE: 96 meq/L (ref 96–112)
CO2: 24 mEq/L (ref 19–32)
CREATININE: 0.88 mg/dL (ref 0.50–1.10)
GFR calc Af Amer: 73 mL/min — ABNORMAL LOW (ref >90)
GFR, EST NON AFRICAN AMERICAN: 63 mL/min — AB (ref >90)
Glucose, Bld: 100 mg/dL — ABNORMAL HIGH (ref 70–99)
Potassium: 4.3 mEq/L (ref 3.7–5.3)
Sodium: 135 mEq/L — ABNORMAL LOW (ref 137–147)
Total Bilirubin: 0.4 mg/dL (ref 0.3–1.2)
Total Protein: 7.9 g/dL (ref 6.0–8.3)

## 2014-06-24 LAB — SEDIMENTATION RATE: SED RATE: 55 mm/h — AB (ref 0–22)

## 2014-06-24 LAB — URINE MICROSCOPIC-ADD ON

## 2014-06-24 MED ORDER — ONDANSETRON HCL 4 MG/2ML IJ SOLN
4.0000 mg | Freq: Once | INTRAMUSCULAR | Status: AC
  Administered 2014-06-24: 4 mg via INTRAVENOUS
  Filled 2014-06-24: qty 2

## 2014-06-24 MED ORDER — IPRATROPIUM BROMIDE 0.02 % IN SOLN
0.5000 mg | Freq: Once | RESPIRATORY_TRACT | Status: AC
  Administered 2014-06-24: 0.5 mg via RESPIRATORY_TRACT
  Filled 2014-06-24: qty 2.5

## 2014-06-24 MED ORDER — METHYLPREDNISOLONE SODIUM SUCC 125 MG IJ SOLR
125.0000 mg | Freq: Once | INTRAMUSCULAR | Status: AC
  Administered 2014-06-25: 125 mg via INTRAVENOUS
  Filled 2014-06-24: qty 2

## 2014-06-24 MED ORDER — SODIUM CHLORIDE 0.9 % IV BOLUS (SEPSIS)
500.0000 mL | Freq: Once | INTRAVENOUS | Status: AC
  Administered 2014-06-25: 500 mL via INTRAVENOUS

## 2014-06-24 MED ORDER — DEXTROSE 5 % IV SOLN
1.0000 g | INTRAVENOUS | Status: DC
  Administered 2014-06-25: 1 g via INTRAVENOUS
  Filled 2014-06-24 (×2): qty 10

## 2014-06-24 MED ORDER — DEXTROSE 5 % IV SOLN: 2.0000 g | Freq: Once | INTRAVENOUS | Status: DC

## 2014-06-24 MED ORDER — FENTANYL CITRATE 0.05 MG/ML IJ SOLN
50.0000 ug | Freq: Once | INTRAMUSCULAR | Status: AC
  Administered 2014-06-24: 50 ug via INTRAVENOUS
  Filled 2014-06-24: qty 2

## 2014-06-24 MED ORDER — ALBUTEROL SULFATE (2.5 MG/3ML) 0.083% IN NEBU
5.0000 mg | INHALATION_SOLUTION | Freq: Once | RESPIRATORY_TRACT | Status: AC
  Administered 2014-06-24: 5 mg via RESPIRATORY_TRACT
  Filled 2014-06-24: qty 6

## 2014-06-24 MED ORDER — SODIUM CHLORIDE 0.9 % IV SOLN
1000.0000 mL | Freq: Once | INTRAVENOUS | Status: AC
  Administered 2014-06-24: 1000 mL via INTRAVENOUS

## 2014-06-24 MED ORDER — VANCOMYCIN HCL IN DEXTROSE 1-5 GM/200ML-% IV SOLN: 1000.0000 mg | Freq: Once | INTRAVENOUS | Status: DC

## 2014-06-24 MED ORDER — SODIUM CHLORIDE 0.9 % IV SOLN: 1000.0000 mL | INTRAVENOUS | Status: DC

## 2014-06-24 NOTE — ED Notes (Signed)
Pt states she has stage 4 lung cancer, states having chills, body aches, and temp of 102. 4, states doctor told her to come here, started today.

## 2014-06-24 NOTE — ED Provider Notes (Signed)
CSN: [id number]     Arrival date & time 06/24/14  1940 History   First MD Initiated Contact with Patient 06/24/14 2017     Chief Complaint  Patient presents with  . Fever  . Generalized Body Aches     (Consider location/radiation/quality/duration/timing/severity/associated sxs/prior Treatment) HPI Patient reports she has non-small cell lung cancer that was diagnosed in 2012. She has stopped chemotherapy and radiation therapy last year. She also has a history of rheumatoid arthritis. She's been having some pain in her joints however last night she started getting more diffuse achiness. She reports increasing pain in her right shoulder, her knees, and both her hands. She states this evening just prior to coming to the ED she had a temperature of 102.4. She had a brief episode of chills. She has a chronic unchanged cough. She denies sore throat, rhinorrhea, nausea, vomiting, diarrhea. She denies dysuria or frequency, wheezing or worsening of her chronic shortness of breath. She states she's been having pain in her port  over the past month and she feels like it's swollen around it. It has been in place since 2013.  Patient is chronically on prednisone 5 mg a day for  her rheumatoid arthritis.  PCP Dr Noah Delaine Oncologist Dr Julien Nordmann Pulmonologist Dr Onnie Graham  Past Medical History  Diagnosis Date  . COPD (chronic obstructive pulmonary disease)   . Neutropenia, drug-induced 05/05/2012  . Hyperlipidemia   . Rheumatoid arthritis(714.0)   . Asthma   . Hiatal hernia   . History of chemotherapy   . History of radiation therapy 03/06/2012    left hilum  . History of radiation therapy 05/10/2013-05/31/2013    Left lung/ 33/75@2 .25 per fraction x 15 fractions  . Radiation 11/15/13-11/26/13    Right hilum 30 Gy in 10 fractions  . Non-small cell lung cancer dx'd 08/28/11    left lung   Past Surgical History  Procedure Laterality Date  . Appendex  1962  . Video bronchoscopy  01/28/2012    Procedure:  VIDEO BRONCHOSCOPY WITHOUT FLUORO;  Surgeon: Brand Males, MD;  Location: [facility] ENDOSCOPY;  Service: Endoscopy;  Laterality: Bilateral;  . Surgery on right wrist     Family History  Problem Relation Age of Onset  . Diabetes Mother     insulin dependent  . Breast cancer Mother    History  Substance Use Topics  . Smoking status: Former Smoker -- 0.50 packs/day for 40 years    Types: Cigarettes    Quit date: 10/29/2009  . Smokeless tobacco: Never Used  . Alcohol Use: No   Lives at home Lives with spouse   OB History   Grav Para Term Preterm Abortions TAB SAB Ect Mult Living                 Review of Systems  All other systems reviewed and are negative.     Allergies  Shellfish allergy  Home Medications   Prior to Admission medications   Medication Sig Start Date End Date Taking? Authorizing Provider  albuterol (PROVENTIL) (2.5 MG/3ML) 0.083% nebulizer solution Take 3 mLs (2.5 mg total) by nebulization 4 (four) times daily. 04/26/14   Brand Males, MD  albuterol (VENTOLIN HFA) 108 (90 BASE) MCG/ACT inhaler Inhale 2 puffs into the lungs every 3 (three) hours as needed for wheezing or shortness of breath. 02/22/14   Brand Males, MD  calcium-vitamin D (OSCAL WITH D) 500-200 MG-UNIT per tablet Take 1 tablet by mouth daily.    Historical Provider, MD  fluticasone (  FLONASE) 50 MCG/ACT nasal spray Place 2 sprays into both nostrils daily. 02/01/14   Brand Males, MD  folic acid (FOLVITE) 865 MCG tablet Take 400 mcg by mouth daily.    Historical Provider, MD  HYDROcodone-acetaminophen (NORCO/VICODIN) 5-325 MG per tablet Take 1 tablet by mouth every 4 (four) hours as needed. 05/25/14   Curt Bears, MD  ipratropium (ATROVENT) 0.02 % nebulizer solution Take 2.5 mLs (0.5 mg total) by nebulization 4 (four) times daily. 04/26/14 07/31/16  Brand Males, MD  Naproxen Sodium (ALEVE) 220 MG CAPS Take 1 capsule by mouth daily as needed (pain).     Historical Provider, MD   predniSONE (DELTASONE) 5 MG tablet Take 5 mg by mouth daily.     Historical Provider, MD  simvastatin (ZOCOR) 5 MG tablet Take 5 mg by mouth at bedtime.    Historical Provider, MD   BP 143/82  Pulse 133  Temp(Src) 99.2 F (37.3 C) (Oral)  Resp 17  SpO2 94%  Vital signs normal except tachycardia and low-grade temp  Physical Exam  Nursing note and vitals reviewed. Constitutional: She is oriented to person, place, and time. She appears well-developed and well-nourished.  Non-toxic appearance. She does not appear ill. No distress.  HENT:  Head: Normocephalic and atraumatic.  Right Ear: External ear normal.  Left Ear: External ear normal.  Nose: Nose normal. No mucosal edema or rhinorrhea.  Mouth/Throat: Oropharynx is clear and moist and mucous membranes are normal. No dental abscesses or uvula swelling.  Eyes: Conjunctivae and EOM are normal. Pupils are equal, round, and reactive to light.  Neck: Normal range of motion and full passive range of motion without pain. Neck supple.  Cardiovascular: Normal rate, regular rhythm and normal heart sounds.  Exam reveals no gallop and no friction rub.   No murmur heard. Pulmonary/Chest: Effort normal and breath sounds normal. No respiratory distress. She has no wheezes. She has no rhonchi. She has no rales. She exhibits tenderness. She exhibits no crepitus.  Patient does not appear to be painful over the actual port however she is painful just superior and lateral to it was a mild swelling of the soft tissue. The skin is normal in appearance without warmth.  Patient has some diminished breath sounds with rare wheeze  Abdominal: Soft. Normal appearance and bowel sounds are normal. She exhibits no distension. There is no tenderness. There is no rebound and no guarding.  Musculoskeletal: Normal range of motion. She exhibits no edema and no tenderness.  Patient extremely tender in her right shoulder. There appears to be some mild diffuse swelling.  There is no obvious joint effusion noted. She started to have some mild ulnar deviation of her fingers and some thickening and swelling of her MCP joints of her fingers. Although she complains of swelling in her knees her knees do not have any obvious swelling or joint effusions.  Neurological: She is alert and oriented to person, place, and time. She has normal strength. No cranial nerve deficit.  Skin: Skin is warm, dry and intact. No rash noted. No erythema. No pallor.  Psychiatric: She has a normal mood and affect. Her speech is normal and behavior is normal. Her mood appears not anxious.    ED Course  Procedures (including critical care time)  Medications  0.9 %  sodium chloride infusion (1,000 mLs Intravenous New Bag/Given 06/24/14 2126)    Followed by  0.9 %  sodium chloride infusion (not administered)  sodium chloride 0.9 % bolus 500 mL (not administered)  methylPREDNISolone sodium succinate (SOLU-MEDROL) 125 mg/2 mL injection 125 mg (not administered)  cefTRIAXone (ROCEPHIN) 1 g in dextrose 5 % 50 mL IVPB (not administered)  fentaNYL (SUBLIMAZE) injection 50 mcg (50 mcg Intravenous Given 06/24/14 2126)  ondansetron (ZOFRAN) injection 4 mg (4 mg Intravenous Given 06/24/14 2127)  albuterol (PROVENTIL) (2.5 MG/3ML) 0.083% nebulizer solution 5 mg (5 mg Nebulization Given 06/24/14 2059)  ipratropium (ATROVENT) nebulizer solution 0.5 mg (0.5 mg Nebulization Given 06/24/14 2059)    Patient was given IV fluid bolus and started on IV pain and nausea medication. She was given an albuterol nebulizer which she states she was due for this evening. Patient has persistent tachycardia. She also has persistent low-grade temp in the ED. Patient has had a port in place for about 2 years. This may be the source of her fever with her worsening discomfort around the port site.  23:39 Dr Shanon Brow, wants to start patient on just rocephin for possible UTI, and stop the maxipime and vancomycin I ordered for possible port  infection. Admit to tele, obs, team 8  Labs Review Results for orders placed during the hospital encounter of 06/24/14  CBC WITH DIFFERENTIAL      Result Value Ref Range   WBC 7.9  4.0 - 10.5 K/uL   RBC 4.22  3.87 - 5.11 MIL/uL   Hemoglobin 9.9 (*) 12.0 - 15.0 g/dL   HCT 30.2 (*) 36.0 - 46.0 %   MCV 71.6 (*) 78.0 - 100.0 fL   MCH 23.5 (*) 26.0 - 34.0 pg   MCHC 32.8  30.0 - 36.0 g/dL   RDW 16.3 (*) 11.5 - 15.5 %   Platelets 383  150 - 400 K/uL   Neutrophils Relative % 79 (*) 43 - 77 %   Neutro Abs 6.3  1.7 - 7.7 K/uL   Lymphocytes Relative 12  12 - 46 %   Lymphs Abs 0.9  0.7 - 4.0 K/uL   Monocytes Relative 8  3 - 12 %   Monocytes Absolute 0.6  0.1 - 1.0 K/uL   Eosinophils Relative 1  0 - 5 %   Eosinophils Absolute 0.1  0.0 - 0.7 K/uL   Basophils Relative 0  0 - 1 %   Basophils Absolute 0.0  0.0 - 0.1 K/uL  COMPREHENSIVE METABOLIC PANEL      Result Value Ref Range   Sodium 135 (*) 137 - 147 mEq/L   Potassium 4.3  3.7 - 5.3 mEq/L   Chloride 96  96 - 112 mEq/L   CO2 24  19 - 32 mEq/L   Glucose, Bld 100 (*) 70 - 99 mg/dL   BUN 13  6 - 23 mg/dL   Creatinine, Ser 0.88  0.50 - 1.10 mg/dL   Calcium 9.6  8.4 - 10.5 mg/dL   Total Protein 7.9  6.0 - 8.3 g/dL   Albumin 3.3 (*) 3.5 - 5.2 g/dL   AST 14  0 - 37 U/L   ALT 7  0 - 35 U/L   Alkaline Phosphatase 75  39 - 117 U/L   Total Bilirubin 0.4  0.3 - 1.2 mg/dL   GFR calc non Af Amer 63 (*) >90 mL/min   GFR calc Af Amer 73 (*) >90 mL/min   Anion gap 15  5 - 15  URINALYSIS, ROUTINE W REFLEX MICROSCOPIC      Result Value Ref Range   Color, Urine YELLOW  YELLOW   APPearance CLOUDY (*) CLEAR   Specific Gravity, Urine 1.007  1.005 - 1.030   pH 7.5  5.0 - 8.0   Glucose, UA NEGATIVE  NEGATIVE mg/dL   Hgb urine dipstick NEGATIVE  NEGATIVE   Bilirubin Urine NEGATIVE  NEGATIVE   Ketones, ur NEGATIVE  NEGATIVE mg/dL   Protein, ur NEGATIVE  NEGATIVE mg/dL   Urobilinogen, UA 0.2  0.0 - 1.0 mg/dL   Nitrite NEGATIVE  NEGATIVE   Leukocytes,  UA SMALL (*) NEGATIVE  SEDIMENTATION RATE      Result Value Ref Range   Sed Rate 55 (*) 0 - 22 mm/hr  URINE MICROSCOPIC-ADD ON      Result Value Ref Range   Squamous Epithelial / LPF RARE  RARE   WBC, UA 7-10  <3 WBC/hpf   Bacteria, UA MANY (*) RARE   Laboratory interpretation all normal except anemia, moderately elevated SED rate, uti    Imaging Review Dg Chest 2 View  06/24/2014   CLINICAL DATA:  Chills wrong body aches, fever. Stage IV lung cancer.  EXAM: CHEST  2 VIEW  COMPARISON:  12/29/2013  FINDINGS: 18 mm nodule in the right mid lung corresponding to patient's known the right middle lobe lesion. Scarring in both hilar regions with superior retraction of the hila, likely radiation change. Normal heart size and pulmonary vascularity. No focal airspace disease or consolidation. No blunting of costophrenic angles. No pneumothorax. Power port type central venous catheter tip over the mid SVC region. No significant change since previous study.  IMPRESSION: Stable chronic changes in the lungs with right middle lobe nodule and scarring in the hilar regions. No evidence of active infiltration.   Electronically Signed   By: Lucienne Capers M.D.   On: 06/24/2014 21:09     EKG Interpretation   Date/Time:  Friday June 24 2014 20:10:26 EDT Ventricular Rate:  118 PR Interval:  156 QRS Duration: 79 QT Interval:  323 QTC Calculation: 452 R Axis:   88 Text Interpretation:  Sinus tachycardia Borderline right axis deviation  Baseline wander in lead(s) III aVF No significant change since last  tracing 29 Dec 2013 Confirmed by Maddalynn Barnard  MD-I, Markail Diekman ([id number]) on 06/24/2014  9:49:37 PM      MDM   Final diagnoses:  Fever, unspecified fever cause  Arthralgia    Plan admission   Rolland Porter, MD, Alanson Aly, MD 06/24/14 [phone]

## 2014-06-24 NOTE — ED Notes (Signed)
Pt states she has a R chest port, states having R shoulder/chest pain, states she feels like it's swollen around port area, pt denies any recent problems with port.

## 2014-06-24 NOTE — H&P (Signed)
PCP:   Thressa Sheller, MD   Chief Complaint:  fever  HPI: 73 yo female h/o stage 4 lung cancer off chemo for a year, copd, RA on chronic steroids comes in with one day of fever.  She has chronic sob and cough from her cancer, this has not changed.  No dysuria or urinary symptoms.  No nasal congestion or sick contacts.  No n/v/d.  No cp or abd pain.  No le pain , swelling or edema.  No recent travel.  She has a port a cath to her rt chest wall.  Her rt shoulder hurts, but she has RA and she always has joint pain in various joints and this is no different than normal either.  No rashes.  She reports around her port hurts alittle but there is no swelling or redness around the area.  We are asked to obs for fever, possible uti.  Review of Systems:  Positive and negative as per HPI otherwise all other systems are negative  Past Medical History: Past Medical History  Diagnosis Date  . COPD (chronic obstructive pulmonary disease)   . Neutropenia, drug-induced 05/05/2012  . Hyperlipidemia   . Rheumatoid arthritis(714.0)   . Asthma   . Hiatal hernia   . History of chemotherapy   . History of radiation therapy 03/06/2012    left hilum  . History of radiation therapy 05/10/2013-05/31/2013    Left lung/ 33/75@2 .25 per fraction x 15 fractions  . Radiation 11/15/13-11/26/13    Right hilum 30 Gy in 10 fractions  . Non-small cell lung cancer dx'd 08/28/11    left lung   Past Surgical History  Procedure Laterality Date  . Appendex  1962  . Video bronchoscopy  01/28/2012    Procedure: VIDEO BRONCHOSCOPY WITHOUT FLUORO;  Surgeon: Brand Males, MD;  Location: [facility] ENDOSCOPY;  Service: Endoscopy;  Laterality: Bilateral;  . Surgery on right wrist      Medications: Prior to Admission medications   Medication Sig Start Date End Date Taking? Authorizing Provider  albuterol (PROVENTIL) (2.5 MG/3ML) 0.083% nebulizer solution Take 2.5 mg by nebulization every 8 (eight) hours.   Yes Historical Provider, MD   albuterol (VENTOLIN HFA) 108 (90 BASE) MCG/ACT inhaler Inhale 2 puffs into the lungs every 3 (three) hours as needed for wheezing or shortness of breath. 02/22/14  Yes Brand Males, MD  calcium-vitamin D (OSCAL WITH D) 500-200 MG-UNIT per tablet Take 1 tablet by mouth daily.   Yes Historical Provider, MD  folic acid (FOLVITE) 811 MCG tablet Take 400 mcg by mouth daily.   Yes Historical Provider, MD  HYDROcodone-acetaminophen (NORCO/VICODIN) 5-325 MG per tablet Take 1 tablet by mouth every 4 (four) hours as needed for moderate pain.   Yes Historical Provider, MD  ipratropium (ATROVENT) 0.02 % nebulizer solution Take 0.5 mg by nebulization every 8 (eight) hours.   Yes Historical Provider, MD  predniSONE (DELTASONE) 5 MG tablet Take 5 mg by mouth daily.    Yes Historical Provider, MD  simvastatin (ZOCOR) 5 MG tablet Take 5 mg by mouth at bedtime.   Yes Historical Provider, MD    Allergies:   Allergies  Allergen Reactions  . Shellfish Allergy Swelling    Social History:  reports that she quit smoking about 4 years ago. Her smoking use included Cigarettes. She has a 20 pack-year smoking history. She has never used smokeless tobacco. She reports that she does not drink alcohol or use illicit drugs.  Family History: Family History  Problem Relation Age  of Onset  . Diabetes Mother     insulin dependent  . Breast cancer Mother     Physical Exam: Filed Vitals:   06/24/14 2200 06/24/14 2230 06/24/14 2300 06/24/14 2302  BP: 123/64 120/55 115/60   Pulse: 131 124 125   Temp:    99.7 F (37.6 C)  TempSrc:    Oral  Resp: 19 22 15    SpO2: 89% 87% 95%    General appearance: alert, cooperative and no distress Head: Normocephalic, without obvious abnormality, atraumatic Eyes: negative Nose: Nares normal. Septum midline. Mucosa normal. No drainage or sinus tenderness. Neck: no JVD and supple, symmetrical, trachea midline Lungs: clear to auscultation bilaterally Heart: regular rate and  rhythm, S1, S2 normal, no murmur, click, rub or gallop Abdomen: soft, non-tender; bowel sounds normal; no masses,  no organomegaly Extremities: extremities normal, atraumatic, no cyanosis or edema Pulses: 2+ and symmetric Skin: Skin color, texture, turgor normal. No rashes or lesions Neurologic: Grossly normal    Labs on Admission:   Recent Labs  06/24/14 2111  NA 135*  K 4.3  CL 96  CO2 24  GLUCOSE 100*  BUN 13  CREATININE 0.88  CALCIUM 9.6    Recent Labs  06/24/14 2111  AST 14  ALT 7  ALKPHOS 75  BILITOT 0.4  PROT 7.9  ALBUMIN 3.3*    Recent Labs  06/24/14 2111  WBC 7.9  NEUTROABS 6.3  HGB 9.9*  HCT 30.2*  MCV 71.6*  PLT 383    Radiological Exams on Admission: Dg Chest 2 View  06/24/2014   CLINICAL DATA:  Chills wrong body aches, fever. Stage IV lung cancer.  EXAM: CHEST  2 VIEW  COMPARISON:  12/29/2013  FINDINGS: 18 mm nodule in the right mid lung corresponding to patient's known the right middle lobe lesion. Scarring in both hilar regions with superior retraction of the hila, likely radiation change. Normal heart size and pulmonary vascularity. No focal airspace disease or consolidation. No blunting of costophrenic angles. No pneumothorax. Power port type central venous catheter tip over the mid SVC region. No significant change since previous study.  IMPRESSION: Stable chronic changes in the lungs with right middle lobe nodule and scarring in the hilar regions. No evidence of active infiltration.   Electronically Signed   By: Lucienne Capers M.D.   On: 06/24/2014 21:09    Assessment/Plan  73 yo female with stage 4 lung cancer and fever due to possible uti  Principal Problem:   UTI (lower urinary tract infection)- only possible source identified is urine.  Cover with iv rocephin.  Urine cx sent off.  ROS is unrevealing, except possible port area which on exam appears very normal.  Could be viral syndrome.  obs overnight on tele.    Active Problems:   Stable unless o/w noted   COPD, moderate   Lung cancer   Anemia   Fever   Sinus tachycardia- due to her history high risk for PE, will obtain CTA tonight to r/o such.  Pt wishes to be DNR.  No cpr or intubation ever in future, discussed in front of multiple family members.  She wishes to go home tomorrow, reassess in am and could possibly be d/c tomorrow afternoon with appropriate abx for uti.    Laquinton Bihm A 06/24/2014, 11:42 PM

## 2014-06-24 NOTE — ED Notes (Signed)
Pt reports 2 days ago onset R shoulder pain and pain and swelling around port site (R chest). Pt states developed fever and chills yesterday and progressively worsening SOB. States port last accessed and flushed early August. A&O x 4. Husband and son at bedside.

## 2014-06-25 ENCOUNTER — Encounter (HOSPITAL_COMMUNITY): Payer: Self-pay | Admitting: *Deleted

## 2014-06-25 DIAGNOSIS — N390 Urinary tract infection, site not specified: Secondary | ICD-10-CM | POA: Diagnosis not present

## 2014-06-25 DIAGNOSIS — C3490 Malignant neoplasm of unspecified part of unspecified bronchus or lung: Secondary | ICD-10-CM | POA: Diagnosis not present

## 2014-06-25 DIAGNOSIS — I498 Other specified cardiac arrhythmias: Secondary | ICD-10-CM

## 2014-06-25 DIAGNOSIS — J449 Chronic obstructive pulmonary disease, unspecified: Secondary | ICD-10-CM | POA: Diagnosis not present

## 2014-06-25 DIAGNOSIS — R509 Fever, unspecified: Secondary | ICD-10-CM | POA: Diagnosis not present

## 2014-06-25 LAB — CBC
HCT: 28.8 % — ABNORMAL LOW (ref 36.0–46.0)
HEMOGLOBIN: 9.4 g/dL — AB (ref 12.0–15.0)
MCH: 23.3 pg — ABNORMAL LOW (ref 26.0–34.0)
MCHC: 32.6 g/dL (ref 30.0–36.0)
MCV: 71.3 fL — AB (ref 78.0–100.0)
Platelets: 341 10*3/uL (ref 150–400)
RBC: 4.04 MIL/uL (ref 3.87–5.11)
RDW: 16.2 % — ABNORMAL HIGH (ref 11.5–15.5)
WBC: 8.4 10*3/uL (ref 4.0–10.5)

## 2014-06-25 LAB — TROPONIN I
Troponin I: <0.3 ng/mL (ref <0.30)
Troponin I: <0.3 ng/mL (ref <0.30)
Troponin I: <0.3 ng/mL (ref <0.30)

## 2014-06-25 LAB — BASIC METABOLIC PANEL
Anion gap: 14 (ref 5–15)
BUN: 11 mg/dL (ref 6–23)
CO2: 24 meq/L (ref 19–32)
Calcium: 8.8 mg/dL (ref 8.4–10.5)
Chloride: 97 mEq/L (ref 96–112)
Creatinine, Ser: 0.83 mg/dL (ref 0.50–1.10)
GFR calc non Af Amer: 68 mL/min — ABNORMAL LOW (ref >90)
GFR, EST AFRICAN AMERICAN: 79 mL/min — AB (ref >90)
GLUCOSE: 120 mg/dL — AB (ref 70–99)
POTASSIUM: 3.9 meq/L (ref 3.7–5.3)
Sodium: 135 mEq/L — ABNORMAL LOW (ref 137–147)

## 2014-06-25 LAB — I-STAT TROPONIN, ED: TROPONIN I, POC: 0 ng/mL (ref 0.00–0.08)

## 2014-06-25 LAB — LACTIC ACID, PLASMA: Lactic Acid, Venous: 0.9 mmol/L (ref 0.5–2.2)

## 2014-06-25 MED ORDER — IPRATROPIUM-ALBUTEROL 0.5-2.5 (3) MG/3ML IN SOLN
3.0000 mL | Freq: Three times a day (TID) | RESPIRATORY_TRACT | Status: DC
  Administered 2014-06-25 (×2): 3 mL via RESPIRATORY_TRACT
  Filled 2014-06-25 (×2): qty 3

## 2014-06-25 MED ORDER — SODIUM CHLORIDE 0.9 % IJ SOLN: 3.0000 mL | Freq: Two times a day (BID) | INTRAMUSCULAR | Status: DC

## 2014-06-25 MED ORDER — IPRATROPIUM BROMIDE 0.02 % IN SOLN: 0.5000 mg | Freq: Three times a day (TID) | RESPIRATORY_TRACT | Status: DC

## 2014-06-25 MED ORDER — ALBUTEROL SULFATE (2.5 MG/3ML) 0.083% IN NEBU: 2.5000 mg | INHALATION_SOLUTION | Freq: Three times a day (TID) | RESPIRATORY_TRACT | Status: DC

## 2014-06-25 MED ORDER — SIMVASTATIN 5 MG PO TABS
5.0000 mg | ORAL_TABLET | Freq: Every day | ORAL | Status: DC
  Administered 2014-06-25: 5 mg via ORAL
  Filled 2014-06-25 (×2): qty 1

## 2014-06-25 MED ORDER — SODIUM CHLORIDE 0.9 % IV SOLN: 250.0000 mL | INTRAVENOUS | Status: DC | PRN

## 2014-06-25 MED ORDER — IOHEXOL 350 MG/ML SOLN
100.0000 mL | Freq: Once | INTRAVENOUS | Status: AC | PRN
  Administered 2014-06-25: 100 mL via INTRAVENOUS

## 2014-06-25 MED ORDER — HYDROCODONE-ACETAMINOPHEN 5-325 MG PO TABS: 1.0000 | ORAL_TABLET | ORAL | Status: DC | PRN

## 2014-06-25 MED ORDER — ALBUTEROL SULFATE (2.5 MG/3ML) 0.083% IN NEBU: 3.0000 mL | INHALATION_SOLUTION | RESPIRATORY_TRACT | Status: DC | PRN

## 2014-06-25 MED ORDER — DEXTROSE 5 % IV SOLN: 1.0000 g | INTRAVENOUS | Status: DC

## 2014-06-25 MED ORDER — SODIUM CHLORIDE 0.9 % IV SOLN
INTRAVENOUS | Status: DC
  Administered 2014-06-25 (×2): via INTRAVENOUS

## 2014-06-25 MED ORDER — ACETAMINOPHEN 650 MG RE SUPP: 650.0000 mg | Freq: Four times a day (QID) | RECTAL | Status: DC | PRN

## 2014-06-25 MED ORDER — SODIUM CHLORIDE 0.9 % IJ SOLN: 3.0000 mL | INTRAMUSCULAR | Status: DC | PRN

## 2014-06-25 MED ORDER — ACETAMINOPHEN 325 MG PO TABS: 650.0000 mg | ORAL_TABLET | Freq: Four times a day (QID) | ORAL | Status: DC | PRN

## 2014-06-25 MED ORDER — PREDNISONE 5 MG PO TABS
5.0000 mg | ORAL_TABLET | Freq: Every day | ORAL | Status: DC
  Administered 2014-06-25: 5 mg via ORAL
  Filled 2014-06-25 (×2): qty 1

## 2014-06-25 MED ORDER — CALCIUM CARBONATE-VITAMIN D 500-200 MG-UNIT PO TABS
1.0000 | ORAL_TABLET | Freq: Every day | ORAL | Status: DC
  Administered 2014-06-25: 1 via ORAL
  Filled 2014-06-25: qty 1

## 2014-06-25 MED ORDER — SODIUM CHLORIDE 0.9 % IJ SOLN
3.0000 mL | Freq: Two times a day (BID) | INTRAMUSCULAR | Status: DC
  Administered 2014-06-25: 3 mL via INTRAVENOUS

## 2014-06-25 MED ORDER — CIPROFLOXACIN HCL 500 MG PO TABS: 500.0000 mg | ORAL_TABLET | Freq: Two times a day (BID) | ORAL | Status: AC

## 2014-06-25 NOTE — Progress Notes (Signed)
UR completed 

## 2014-06-25 NOTE — ED Notes (Signed)
Pt to go to floor after CT Angio completed.

## 2014-06-25 NOTE — Discharge Summary (Signed)
Discharge Summary  Destiny Harrison TKP:[id number] DOB: [birth year]  PCP: Thressa Sheller, MD  Admit date: 06/24/2014 Discharge date: 06/25/2014  Time spent: 25 minutes  Recommendations for Outpatient Follow-up:  1. Medication: Cipro 500 mg twice a day x4 days 2. She will follow up with her PCP in the next one month as needed   Discharge Diagnoses:  Active Hospital Problems   Diagnosis Date Noted  . UTI (lower urinary tract infection) 06/24/2014  . Fever 06/24/2014  . Sinus tachycardia 06/24/2014  . Anemia 12/29/2013  . Lung cancer 02/06/2012  . 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    Discharge Condition: Improved, being discharged home  Diet recommendation: Low-sodium diet  Filed Weights   06/25/14 0245  Weight: 65.908 kg (145 lb 4.8 oz)    History of present illness:  Patient with a history of COPD and lung cancer presented to the emergency room on 9/4 evening with complaints of fever and was noted to be tachycardic. Is a concern for early infection. Chest x-ray unrevealing, but urinalysis positive for a mild UTI. Patient started on IV Rocephin. CT angiogram unrevealing for a pulmonary embolus. Patient brought in for overnight observation to the hospitalist service.  Hospital Course:  Principal Problem:   UTI (lower urinary tract infection): By following day, patient's tachycardia resolved. No fever noted. She herself is feeling much better. We'll plan to discharge home on by mouth antibiotics for the next few days Active Problems:   COPD, moderate: At baseline   Lung cancer   Anemia: Stable. Hemoglobin has remained unchanged between 9-10   Fever: Felt to be secondary UTI   Sinus tachycardia: Secondary UTI dehydration   Procedures:  None  Consultations:  None  Discharge Exam: BP 125/62  Pulse 99  Temp(Src) 98.8 F (37.1 C) (Oral)  Resp 18  Ht 5\' 4"  (1.626 m)  Wt 65.908  kg (145 lb 4.8 oz)  BMI 24.93 kg/m2  SpO2 100%  General: Alert and oriented x3, no acute distress Cardiovascular: Regular rate and rhythm, S1S2 Respiratory: Decreased breath sounds throughout  Discharge Instructions You were cared for by a hospitalist during your hospital stay. If you have any questions about your discharge medications or the care you received while you were in the hospital after you are discharged, you can call the unit and asked to speak with the hospitalist on call if the hospitalist that took care of you is not available. Once you are discharged, your primary care physician will handle any further medical issues. Please note that NO REFILLS for any discharge medications will be authorized once you are discharged, as it is imperative that you return to your primary care physician (or establish a relationship with a primary care physician if you do not have one) for your aftercare needs so that they can reassess your need for medications and monitor your lab values.  Discharge Instructions   Diet - low sodium heart healthy    Complete by:  As directed      Increase activity slowly    Complete by:  As directed             Medication List         albuterol (2.5 MG/3ML) 0.083% nebulizer solution  Commonly known as:  PROVENTIL  Take 2.5 mg by nebulization every 8 (eight) hours.     albuterol 108 (90 BASE) MCG/ACT inhaler  Commonly known as:  VENTOLIN HFA  Inhale 2 puffs into the lungs every 3 (three) hours as needed for wheezing or shortness of breath.     calcium-vitamin D 500-200 MG-UNIT per tablet  Commonly known as:  OSCAL WITH D  Take 1 tablet by mouth daily.     ciprofloxacin 500 MG tablet  Commonly known as:  CIPRO  Take 1 tablet (500 mg total) by mouth 2 (two) times daily.     folic acid 295 MCG tablet  Commonly known as:  FOLVITE  Take 400 mcg by mouth daily.     HYDROcodone-acetaminophen 5-325 MG per tablet  Commonly known as:  NORCO/VICODIN  Take  1 tablet by mouth every 4 (four) hours as needed for moderate pain.     ipratropium 0.02 % nebulizer solution  Commonly known as:  ATROVENT  Take 0.5 mg by nebulization every 8 (eight) hours.     predniSONE 5 MG tablet  Commonly known as:  DELTASONE  Take 5 mg by mouth daily.     simvastatin 5 MG tablet  Commonly known as:  ZOCOR  Take 5 mg by mouth at bedtime.       Allergies  Allergen Reactions  . Shellfish Allergy Swelling       Follow-up Information   Follow up with Thressa Sheller, MD In 1 month.   Specialty:  Internal Medicine   Contact information:   [place], [address] [place] [address] [phone]        The results of significant diagnostics from this hospitalization (including imaging, microbiology, ancillary and laboratory) are listed below for reference.    Significant Diagnostic Studies: Dg Chest 2 View  06/24/2014   CLINICAL DATA:  Chills wrong body aches, fever. Stage IV lung cancer.  EXAM: CHEST  2 VIEW  COMPARISON:  12/29/2013  FINDINGS: 18 mm nodule in the right mid lung corresponding to patient's known the right middle lobe lesion. Scarring in both hilar regions with superior retraction of the hila, likely radiation change. Normal heart size and pulmonary vascularity. No focal airspace disease or consolidation. No blunting of costophrenic angles. No pneumothorax. Power port type central venous catheter tip over the mid SVC region. No significant change since previous study.  IMPRESSION: Stable chronic changes in the lungs with right middle lobe nodule and scarring in the hilar regions. No evidence of active infiltration.   Electronically Signed   By: Lucienne Capers M.D.   On: 06/24/2014 21:09   Ct Angio Chest Pe W/cm &/or Wo Cm  06/25/2014   CLINICAL DATA:  Right-sided chest pain and shortness of breath. Small cell lung carcinoma. Previous chemotherapy and radiation therapy.  EXAM: CT ANGIOGRAPHY CHEST WITH CONTRAST  TECHNIQUE:  Multidetector CT imaging of the chest was performed using the standard protocol during bolus administration of intravenous contrast. Multiplanar CT image reconstructions and MIPs were obtained to evaluate the vascular anatomy.  CONTRAST:  150mL OMNIPAQUE IOHEXOL 350 MG/ML SOLN  COMPARISON:  04/28/2014  FINDINGS: Vascular/Cardiac: Satisfactory opacification of pulmonary arteries is demonstated, and no pulmonary emboli are identified. No evidence of thoracic aortic aneurysm or other significant abnormality.  Mediastinum/Hilar Regions: Mild lymphadenopathy in the right paratracheal region measures 1.6 x 2.2 cm and is not significant change since prior study. No new areas of mediastinal or hilar lymphadenopathy identified.  Lungs: Mild to moderate emphysema is stable as well as paramediastinal radiation changes in the left lung.  Mild increase in size of spiculated nodule in the central right middle lobe is seen since  previous study, currently measuring 2.2 x 2.7 cm on image 43 compared to 1.4 x 2.0 cm previously.  Pleura:  No evidence of effusion or mass.  Musculoskeletal: No suspicious bone lesions identified. Old upper thoracic vertebral body compression deformity again noted.  Other:  None.  IMPRESSION: No evidence of acute pulmonary embolism.  Mild increase in size of spiculated nodule in the central right middle lobe, consistent with carcinoma.  Stable mild mediastinal lymphadenopathy, consistent with metastatic disease.  Emphysema.   Electronically Signed   By: Earle Gell M.D.   On: 06/25/2014 01:24    Microbiology: No results found for this or any previous visit (from the past 240 hour(s)).   Labs: Basic Metabolic Panel:  Recent Labs Lab 06/24/14 2111 06/25/14 0219  NA 135* 135*  K 4.3 3.9  CL 96 97  CO2 24 24  GLUCOSE 100* 120*  BUN 13 11  CREATININE 0.88 0.83  CALCIUM 9.6 8.8   Liver Function Tests:  Recent Labs Lab 06/24/14 2111  AST 14  ALT 7  ALKPHOS 75  BILITOT 0.4  PROT 7.9   ALBUMIN 3.3*   No results found for this basename: LIPASE, AMYLASE,  in the last 168 hours No results found for this basename: AMMONIA,  in the last 168 hours CBC:  Recent Labs Lab 06/24/14 2111 06/25/14 0219  WBC 7.9 8.4  NEUTROABS 6.3  --   HGB 9.9* 9.4*  HCT 30.2* 28.8*  MCV 71.6* 71.3*  PLT 383 341   Cardiac Enzymes:  Recent Labs Lab 06/25/14 0219 06/25/14 0710 06/25/14 1302  TROPONINI <0.30 <0.30 <0.30   BNP: BNP (last 3 results)  Recent Labs  12/29/13 0035  PROBNP 93.5   CBG: No results found for this basename: GLUCAP,  in the last 168 hours     Signed:  Annita Brod  [facility] 06/25/2014, 2:56 PM

## 2014-06-27 LAB — URINE CULTURE

## 2014-07-01 LAB — CULTURE, BLOOD (ROUTINE X 2)
Culture: NO GROWTH
Culture: NO GROWTH

## 2014-07-07 ENCOUNTER — Ambulatory Visit (HOSPITAL_BASED_OUTPATIENT_CLINIC_OR_DEPARTMENT_OTHER): Payer: Medicare Other

## 2014-07-07 VITALS — BP 116/64 | HR 83 | Temp 97.9°F

## 2014-07-07 DIAGNOSIS — C3410 Malignant neoplasm of upper lobe, unspecified bronchus or lung: Secondary | ICD-10-CM

## 2014-07-07 DIAGNOSIS — Z95828 Presence of other vascular implants and grafts: Secondary | ICD-10-CM

## 2014-07-07 DIAGNOSIS — Z452 Encounter for adjustment and management of vascular access device: Secondary | ICD-10-CM | POA: Diagnosis not present

## 2014-07-07 MED ORDER — SODIUM CHLORIDE 0.9 % IJ SOLN
10.0000 mL | INTRAMUSCULAR | Status: DC | PRN
  Administered 2014-07-07: 10 mL via INTRAVENOUS
  Filled 2014-07-07: qty 10

## 2014-07-07 MED ORDER — HEPARIN SOD (PORK) LOCK FLUSH 100 UNIT/ML IV SOLN
500.0000 [IU] | Freq: Once | INTRAVENOUS | Status: AC
  Administered 2014-07-07: 500 [IU] via INTRAVENOUS
  Filled 2014-07-07: qty 5

## 2014-07-07 NOTE — Patient Instructions (Signed)

## 2014-07-28 ENCOUNTER — Other Ambulatory Visit: Payer: Self-pay | Admitting: *Deleted

## 2014-07-28 DIAGNOSIS — C3490 Malignant neoplasm of unspecified part of unspecified bronchus or lung: Secondary | ICD-10-CM

## 2014-07-28 MED ORDER — HYDROCODONE-ACETAMINOPHEN 5-325 MG PO TABS: 1.0000 | ORAL_TABLET | ORAL | Status: DC | PRN

## 2014-08-04 ENCOUNTER — Other Ambulatory Visit (HOSPITAL_BASED_OUTPATIENT_CLINIC_OR_DEPARTMENT_OTHER): Payer: Medicare Other

## 2014-08-04 ENCOUNTER — Encounter (HOSPITAL_COMMUNITY): Payer: Self-pay

## 2014-08-04 ENCOUNTER — Encounter (HOSPITAL_COMMUNITY)
Admission: RE | Admit: 2014-08-04 | Discharge: 2014-08-04 | Disposition: A | Discharge status: Home / Self Care | Payer: Medicare Other | Source: Ambulatory Visit | Attending: Diagnostic Radiology | Admitting: Diagnostic Radiology

## 2014-08-04 DIAGNOSIS — C3411 Malignant neoplasm of upper lobe, right bronchus or lung: Secondary | ICD-10-CM

## 2014-08-04 DIAGNOSIS — C3412 Malignant neoplasm of upper lobe, left bronchus or lung: Secondary | ICD-10-CM | POA: Diagnosis not present

## 2014-08-04 DIAGNOSIS — R911 Solitary pulmonary nodule: Secondary | ICD-10-CM | POA: Diagnosis not present

## 2014-08-04 DIAGNOSIS — Z9221 Personal history of antineoplastic chemotherapy: Secondary | ICD-10-CM | POA: Insufficient documentation

## 2014-08-04 DIAGNOSIS — R591 Generalized enlarged lymph nodes: Secondary | ICD-10-CM | POA: Insufficient documentation

## 2014-08-04 DIAGNOSIS — D259 Leiomyoma of uterus, unspecified: Secondary | ICD-10-CM | POA: Diagnosis not present

## 2014-08-04 DIAGNOSIS — Z923 Personal history of irradiation: Secondary | ICD-10-CM | POA: Diagnosis not present

## 2014-08-04 LAB — CBC WITH DIFFERENTIAL/PLATELET
BASO%: 0.4 % (ref 0.0–2.0)
Basophils Absolute: 0 10*3/uL (ref 0.0–0.1)
EOS%: 1 % (ref 0.0–7.0)
Eosinophils Absolute: 0.1 10*3/uL (ref 0.0–0.5)
HCT: 35.2 % (ref 34.8–46.6)
HGB: 11.2 g/dL — ABNORMAL LOW (ref 11.6–15.9)
LYMPH%: 16.6 % (ref 14.0–49.7)
MCH: 23.4 pg — ABNORMAL LOW (ref 25.1–34.0)
MCHC: 31.8 g/dL (ref 31.5–36.0)
MCV: 73.5 fL — ABNORMAL LOW (ref 79.5–101.0)
MONO#: 0.8 10*3/uL (ref 0.1–0.9)
MONO%: 10.4 % (ref 0.0–14.0)
NEUT#: 5.7 10*3/uL (ref 1.5–6.5)
NEUT%: 71.6 % (ref 38.4–76.8)
Platelets: 337 10*3/uL (ref 145–400)
RBC: 4.79 10*6/uL (ref 3.70–5.45)
RDW: 18.9 % — ABNORMAL HIGH (ref 11.2–14.5)
WBC: 8 10*3/uL (ref 3.9–10.3)
lymph#: 1.3 10*3/uL (ref 0.9–3.3)

## 2014-08-04 LAB — COMPREHENSIVE METABOLIC PANEL (CC13)
ALT: 9 U/L (ref 0–55)
ANION GAP: 10 meq/L (ref 3–11)
AST: 12 U/L (ref 5–34)
Albumin: 3.3 g/dL — ABNORMAL LOW (ref 3.5–5.0)
Alkaline Phosphatase: 82 U/L (ref 40–150)
BILIRUBIN TOTAL: 1.01 mg/dL (ref 0.20–1.20)
BUN: 9.3 mg/dL (ref 7.0–26.0)
CO2: 26 mEq/L (ref 22–29)
CREATININE: 1 mg/dL (ref 0.6–1.1)
Calcium: 9.8 mg/dL (ref 8.4–10.4)
Chloride: 100 mEq/L (ref 98–109)
GLUCOSE: 109 mg/dL (ref 70–140)
Potassium: 3.7 mEq/L (ref 3.5–5.1)
Sodium: 136 mEq/L (ref 136–145)
Total Protein: 7.8 g/dL (ref 6.4–8.3)

## 2014-08-04 MED ORDER — IOHEXOL 300 MG/ML  SOLN
100.0000 mL | Freq: Once | INTRAMUSCULAR | Status: AC | PRN
  Administered 2014-08-04: 100 mL via INTRAVENOUS

## 2014-08-11 ENCOUNTER — Telehealth: Payer: Self-pay | Admitting: Internal Medicine

## 2014-08-11 ENCOUNTER — Encounter: Payer: Self-pay | Admitting: Internal Medicine

## 2014-08-11 ENCOUNTER — Ambulatory Visit (HOSPITAL_BASED_OUTPATIENT_CLINIC_OR_DEPARTMENT_OTHER): Payer: Medicare Other | Admitting: Internal Medicine

## 2014-08-11 VITALS — BP 154/77 | HR 105 | Temp 98.1°F | Resp 18 | Ht 64.0 in | Wt 135.1 lb

## 2014-08-11 DIAGNOSIS — C3411 Malignant neoplasm of upper lobe, right bronchus or lung: Secondary | ICD-10-CM | POA: Diagnosis not present

## 2014-08-11 NOTE — Progress Notes (Signed)
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   PRIOR THERAPY:  1. Status post palliative radiotherapy to the left lung mass under the care of Dr. Pablo Ledger completed on 03/06/2012.  2. Systemic chemotherapy with carboplatin for AUC of 6 on day 1 and Abraxane 100 mg/M2 on days 1, 8 and 15 every 3 weeks. Status post 2 cycles. From cycle 3 forward AUC will be decreased to 4.5 given on day 1 and the Abraxane will be decreased to 90 mg per meter squared on days 1, 8 and 15 every 3 weeks, Status post a total of 3 cycles.  3. Systemic chemotherapy with carboplatin for AUC of 5 on day 1 and gemcitabine 1000 mg/m2 given on day 1 and day 8 every 3 weeks,status post 1 cycle. Due to significant neutropenia she will be dosed reduced beginning cycle 2 forward to carboplatin at an AUC of 4 given on day 1 and gemcitabine at 800 mg per meter squared given on days 1 and 8 every 3 weeks. Status post 6 cycles.  4. Status post palliative radiotherapy to the right hilum under the care of Dr. Pablo Ledger completed on 11/26/2013.  CURRENT THERAPY: Observation.  CHEMOTHERAPY INTENT: Palliative  CURRENT # OF CHEMOTHERAPY CYCLES: 0  CURRENT ANTIEMETICS: Compazine  CURRENT SMOKING STATUS: Former smoker  ORAL CHEMOTHERAPY AND CONSENT: None  CURRENT BISPHOSPHONATES USE: None  PAIN MANAGEMENT: 5/10 right shoulder currently on Norco  NARCOTICS INDUCED CONSTIPATION: None  LIVING WILL AND CODE STATUS: No CODE BLUE   INTERVAL HISTORY: Destiny Harrison 73 y.o. female returns to the clinic today for three-month followup visit accompanied by her son. The patient is feeling fine today with no specific complaints except for mild persistent chronic back pain. She takes Vicodin for pain management  on as-needed basis. She denied having any significant weight loss or night sweats. She has no chest pain, but has shortness of breath increased with exertion and dry, cough with hemoptysis. The patient denied having any significant fever or chills, nausea or vomiting. She had repeat CT scan of the chest, abdomen and pelvis performed recently and she is here for evaluation and discussion of her scan results.  MEDICAL HISTORY: Past Medical History  Diagnosis Date  . COPD (chronic obstructive pulmonary disease)   . Neutropenia, drug-induced 05/05/2012  . Hyperlipidemia   . Rheumatoid arthritis(714.0)   . Asthma   . Hiatal hernia   . History of chemotherapy   . History of radiation therapy 03/06/2012    left hilum  . History of radiation therapy 05/10/2013-05/31/2013    Left lung/ 33/75@2 .25 per fraction x 15 fractions  . Radiation 11/15/13-11/26/13    Right hilum 30 Gy in 10 fractions  . Non-small cell lung cancer dx'd 08/28/11    left lung    ALLERGIES:  is allergic to shellfish allergy.  MEDICATIONS:  Current Outpatient Prescriptions  Medication Sig Dispense Refill  . albuterol (PROVENTIL) (2.5 MG/3ML) 0.083% nebulizer solution Take 2.5 mg by nebulization every 8 (eight) hours.      Marland Kitchen albuterol (VENTOLIN HFA) 108 (90 BASE) MCG/ACT inhaler Inhale 2 puffs into the lungs every 3 (three) hours as needed for wheezing or shortness of breath.  1 Inhaler  6  . calcium-vitamin D (OSCAL WITH D) 500-200 MG-UNIT per tablet Take 1 tablet  by mouth daily.      . folic acid (FOLVITE) 035 MCG tablet Take 400 mcg by mouth daily.      Marland Kitchen HYDROcodone-acetaminophen (NORCO/VICODIN) 5-325 MG per tablet Take 1 tablet by mouth every 4 (four) hours as needed for moderate pain.  30 tablet  0  . ipratropium (ATROVENT) 0.02 % nebulizer solution Take 0.5 mg by nebulization every 8 (eight) hours.      . predniSONE (DELTASONE) 5 MG tablet Take 5 mg by mouth daily.       . simvastatin (ZOCOR) 5 MG tablet Take 5 mg by mouth  at bedtime.       No current facility-administered medications for this visit.    SURGICAL HISTORY:  Past Surgical History  Procedure Laterality Date  . Appendex  1962  . Video bronchoscopy  01/28/2012    Procedure: VIDEO BRONCHOSCOPY WITHOUT FLUORO;  Surgeon: Brand Males, MD;  Location: [facility] ENDOSCOPY;  Service: Endoscopy;  Laterality: Bilateral;  . Surgery on right wrist      REVIEW OF SYSTEMS:  A comprehensive review of systems was negative except for: Musculoskeletal: positive for back pain   PHYSICAL EXAMINATION: General appearance: alert, cooperative and no distress Head: Normocephalic, without obvious abnormality, atraumatic Neck: no adenopathy, no JVD, supple, symmetrical, trachea midline and thyroid not enlarged, symmetric, no tenderness/mass/nodules Lymph nodes: Cervical, supraclavicular, and axillary nodes normal. Resp: clear to auscultation bilaterally Back: symmetric, no curvature. ROM normal. No CVA tenderness. Cardio: regular rate and rhythm, S1, S2 normal, no murmur, click, rub or gallop GI: soft, non-tender; bowel sounds normal; no masses,  no organomegaly Extremities: extremities normal, atraumatic, no cyanosis or edema  ECOG PERFORMANCE STATUS: 1 - Symptomatic but completely ambulatory  Blood pressure 154/77, pulse 105, temperature 98.1 F (36.7 C), temperature source Oral, resp. rate 18, height 5\' 4"  (1.626 m), weight 135 lb 1.6 oz (61.281 kg), SpO2 99.00%.  LABORATORY DATA: Lab Results  Component Value Date   WBC 8.0 08/04/2014   HGB 11.2* 08/04/2014   HCT 35.2 08/04/2014   MCV 73.5* 08/04/2014   PLT 337 08/04/2014      Chemistry      Component Value Date/Time   NA 136 08/04/2014 0909   NA 135* 06/25/2014 0219   K 3.7 08/04/2014 0909   K 3.9 06/25/2014 0219   CL 97 06/25/2014 0219   CL 101 01/12/2013 0810   CO2 26 08/04/2014 0909   CO2 24 06/25/2014 0219   BUN 9.3 08/04/2014 0909   BUN 11 06/25/2014 0219   CREATININE 1.0 08/04/2014 0909   CREATININE  0.83 06/25/2014 0219      Component Value Date/Time   CALCIUM 9.8 08/04/2014 0909   CALCIUM 8.8 06/25/2014 0219   ALKPHOS 82 08/04/2014 0909   ALKPHOS 75 06/24/2014 2111   AST 12 08/04/2014 0909   AST 14 06/24/2014 2111   ALT 9 08/04/2014 0909   ALT 7 06/24/2014 2111   BILITOT 1.01 08/04/2014 0909   BILITOT 0.4 06/24/2014 2111       RADIOGRAPHIC STUDIES: Ct Chest W Contrast  08/04/2014   CLINICAL DATA:  Followup right upper lobe lung carcinoma. Restaging. Previous chemotherapy and radiation therapy. Cough.  EXAM: CT CHEST, ABDOMEN, AND PELVIS WITH CONTRAST  TECHNIQUE: Multidetector CT imaging of the chest, abdomen and pelvis was performed following the standard protocol during bolus administration of intravenous contrast.  CONTRAST:  146mL OMNIPAQUE IOHEXOL 300 MG/ML  SOLN  COMPARISON:  Chest CT on 06/25/2014 and AP CT on 04/28/2014  FINDINGS: CT CHEST FINDINGS  Mediastinum/Hilar Regions: Mediastinal lymphadenopathy in the right paratracheal region remains stable, measuring 1.4 cm on image 14. No new or increased areas of lymphadenopathy identified.  Other Thoracic Lymphadenopathy:  None.  Lungs: Pulmonary emphysema again seen as well as radiation changes in the paramediastinal lung zones. Spiculated nodule in the central right middle lobe measures 2.2 x 2.7 cm on image 29 and is stable. No new or enlarging pulmonary nodules or masses identified.  Pleura:  No evidence of effusion or mass.  Vascular/Cardiac:  No acute findings identified.  Other:  None.  Musculoskeletal:  No suspicious bone lesions identified.  CT ABDOMEN AND PELVIS FINDINGS  Hepatobiliary: Benign flash filling hypervascular lesion in the inferior left hepatic lobe remains stable. No new or enlarging liver masses are identified.  Pancreas: No mass, inflammatory changes, or other parenchymal abnormality identified.  Spleen:  Within normal limits in size and appearance.  Adrenal Glands:  No mass identified.  Kidneys/Urinary Tract: No masses  identified. No evidence of hydronephrosis.  Stomach/Bowel/Peritoneum: No evidence of wall thickening, mass, or obstruction.  Vascular/Lymphatic: No pathologically enlarged lymph nodes identified. No other significant abnormality identified.  Reproductive: Tiny less than 1 cm calcified uterine fibroid again noted. No other significant abnormality identified  Other:  None.  Musculoskeletal: No suspicious bone lesions identified gray old thoracic vertebral body compression fracture and right hip Paget's disease again noted.  IMPRESSION: Stable central right middle lobe pulmonary nodule.  Stable mild right paratracheal lymphadenopathy, consistent with metastatic disease.  No new or progressive disease identified. No evidence of abdominal or pelvic metastatic disease.   Electronically Signed   By: Earle Gell M.D.   On: 08/04/2014 11:22   Ct Abdomen Pelvis W Contrast  08/04/2014   CLINICAL DATA:  Followup right upper lobe lung carcinoma. Restaging. Previous chemotherapy and radiation therapy. Cough.  EXAM: CT CHEST, ABDOMEN, AND PELVIS WITH CONTRAST  TECHNIQUE: Multidetector CT imaging of the chest, abdomen and pelvis was performed following the standard protocol during bolus administration of intravenous contrast.  CONTRAST:  157mL OMNIPAQUE IOHEXOL 300 MG/ML  SOLN  COMPARISON:  Chest CT on 06/25/2014 and AP CT on 04/28/2014  FINDINGS: CT CHEST FINDINGS  Mediastinum/Hilar Regions: Mediastinal lymphadenopathy in the right paratracheal region remains stable, measuring 1.4 cm on image 14. No new or increased areas of lymphadenopathy identified.  Other Thoracic Lymphadenopathy:  None.  Lungs: Pulmonary emphysema again seen as well as radiation changes in the paramediastinal lung zones. Spiculated nodule in the central right middle lobe measures 2.2 x 2.7 cm on image 29 and is stable. No new or enlarging pulmonary nodules or masses identified.  Pleura:  No evidence of effusion or mass.  Vascular/Cardiac:  No acute  findings identified.  Other:  None.  Musculoskeletal:  No suspicious bone lesions identified.  CT ABDOMEN AND PELVIS FINDINGS  Hepatobiliary: Benign flash filling hypervascular lesion in the inferior left hepatic lobe remains stable. No new or enlarging liver masses are identified.  Pancreas: No mass, inflammatory changes, or other parenchymal abnormality identified.  Spleen:  Within normal limits in size and appearance.  Adrenal Glands:  No mass identified.  Kidneys/Urinary Tract: No masses identified. No evidence of hydronephrosis.  Stomach/Bowel/Peritoneum: No evidence of wall thickening, mass, or obstruction.  Vascular/Lymphatic: No pathologically enlarged lymph nodes identified. No other significant abnormality identified.  Reproductive: Tiny less than 1 cm calcified uterine fibroid again noted. No other significant abnormality identified  Other:  None.  Musculoskeletal: No suspicious bone lesions identified  gray old thoracic vertebral body compression fracture and right hip Paget's disease again noted.  IMPRESSION: Stable central right middle lobe pulmonary nodule.  Stable mild right paratracheal lymphadenopathy, consistent with metastatic disease.  No new or progressive disease identified. No evidence of abdominal or pelvic metastatic disease.   Electronically Signed   By: Earle Gell M.D.   On: 08/04/2014 11:22   ASSESSMENT AND PLAN: This is a very pleasant 73 years old [place] American female with metastatic non-small cell lung cancer, squamous cell carcinoma status post several chemotherapy regimen and and recently completed a course of palliative radiotherapy to the right hilum. She is currently on observation.  Her recent CT scan of the chest, abdomen and pelvis showed no evidence for disease progression. I discussed the scan results with the patient and her son. I gave her the option of continuous observation and close monitoring versus consideration of immunotherapy or chemotherapy.  The patient  would like to continue on observation for now. She would have repeat CT scan of the chest, abdomen and pelvis in 6 months and she would come back for followup visit at that time. She was advised to call immediately if she has any concerning symptoms in the interval.  The patient voices understanding of current disease status and treatment options and is in agreement with the current care plan.  All questions were answered. The patient knows to call the clinic with any problems, questions or concerns. We can certainly see the patient much sooner if necessary.  Disclaimer: This note was dictated with voice recognition software. Similar sounding words can inadvertently be transcribed and may not be corrected upon review.

## 2014-08-11 NOTE — Telephone Encounter (Signed)
returned pt call adn r/s appt to thru due to pt on dialysis...pt ok and aware

## 2014-08-11 NOTE — Telephone Encounter (Signed)
gv and printed appt sched and avs for pt for April 2016 °

## 2014-08-16 ENCOUNTER — Encounter: Payer: Self-pay | Admitting: Internal Medicine

## 2014-08-16 ENCOUNTER — Ambulatory Visit (INDEPENDENT_AMBULATORY_CARE_PROVIDER_SITE_OTHER): Payer: Medicare Other | Admitting: Internal Medicine

## 2014-08-16 VITALS — BP 122/70 | HR 100 | Ht 65.0 in | Wt 138.0 lb

## 2014-08-16 DIAGNOSIS — Z23 Encounter for immunization: Secondary | ICD-10-CM | POA: Diagnosis not present

## 2014-08-16 NOTE — Patient Instructions (Addendum)
#  COPD  - stable diseaese  - continue duoneb 4 times daily - use albuterol HFA as needed; call insurance to see what brand is cheaper -Flu Shot 08/16/2014 - Next visit will give PREVNAR vaccine  #Folllowup 5 months or sooner if needed;

## 2014-08-16 NOTE — Progress Notes (Signed)
   Subjective:    Patient ID: Destiny Harrison, female    DOB: 10-22-1940, 73 y.o.   MRN: [id number]  HPI    OV 08/16/2014  Chief Complaint  Patient presents with  . Follow-up    Pt states her breathing is unchanged. Pt c/o DOE, mild dry cough that worsens at night. Pt denies CP/tightness.     COugh - still + . Nothing helps. Plans to deal with it. Does not want new meds. COPD stabe. No new issues  Past, Family, Social reviewed: cancer remission per CT oct 2015. Chronic wrist pain +   Immunization History  Administered Date(s) Administered  . Influenza Split 07/23/2012  . Influenza Whole 09/25/2005, 09/23/2011  . Influenza,inj,Quad PF,36+ Mos 06/29/2013  . Pneumococcal Polysaccharide-23 11/18/2011    Review of Systems  Constitutional: Negative for fever and unexpected weight change.  HENT: Negative for congestion, dental problem, ear pain, nosebleeds, postnasal drip, rhinorrhea, sinus pressure, sneezing, sore throat and trouble swallowing.   Eyes: Negative for redness and itching.  Respiratory: Positive for cough and shortness of breath. Negative for chest tightness and wheezing.   Cardiovascular: Negative for palpitations and leg swelling.  Gastrointestinal: Negative for nausea and vomiting.  Genitourinary: Negative for dysuria.  Musculoskeletal: Negative for joint swelling.  Skin: Negative for rash.  Neurological: Negative for headaches.  Hematological: Does not bruise/bleed easily.  Psychiatric/Behavioral: Negative for dysphoric mood. The patient is not nervous/anxious.    Current outpatient prescriptions:albuterol (PROVENTIL) (2.5 MG/3ML) 0.083% nebulizer solution, Take 2.5 mg by nebulization every 8 (eight) hours., Disp: , Rfl: ;  albuterol (VENTOLIN HFA) 108 (90 BASE) MCG/ACT inhaler, Inhale 2 puffs into the lungs every 3 (three) hours as needed for wheezing or shortness of breath., Disp: 1 Inhaler, Rfl: 6;  calcium-vitamin D (OSCAL WITH D) 500-200 MG-UNIT per tablet, Take  1 tablet by mouth daily., Disp: , Rfl:  folic acid (FOLVITE) 308 MCG tablet, Take 400 mcg by mouth daily., Disp: , Rfl: ;  HYDROcodone-acetaminophen (NORCO/VICODIN) 5-325 MG per tablet, Take 1 tablet by mouth every 4 (four) hours as needed for moderate pain., Disp: 30 tablet, Rfl: 0;  ipratropium (ATROVENT) 0.02 % nebulizer solution, Take 0.5 mg by nebulization every 8 (eight) hours., Disp: , Rfl: ;  predniSONE (DELTASONE) 5 MG tablet, Take 5 mg by mouth daily. , Disp: , Rfl:  simvastatin (ZOCOR) 5 MG tablet, Take 5 mg by mouth at bedtime., Disp: , Rfl:      Objective:   Physical Exam   Filed Vitals:   08/16/14 1216  BP: 122/70  Pulse: 100  Height: 5\' 5"  (1.651 m)  Weight: 138 lb (62.596 kg)  SpO2: 95%        Assessment & Plan:  #COPD  - stable diseaese  - continue duoneb 4 times daily - use albuterol HFA as needed; call insurance to see what brand is cheaper -Flu Shot 08/16/2014 - Next visit will give PREVNAR vaccine  #Folllowup 5 months or sooner if needed;

## 2014-08-18 DIAGNOSIS — Z23 Encounter for immunization: Secondary | ICD-10-CM | POA: Insufficient documentation

## 2014-08-22 ENCOUNTER — Other Ambulatory Visit: Payer: Self-pay | Admitting: *Deleted

## 2014-08-22 DIAGNOSIS — C3490 Malignant neoplasm of unspecified part of unspecified bronchus or lung: Secondary | ICD-10-CM

## 2014-08-22 MED ORDER — HYDROCODONE-ACETAMINOPHEN 5-325 MG PO TABS: 1.0000 | ORAL_TABLET | ORAL | Status: DC | PRN

## 2014-09-20 DIAGNOSIS — M899 Disorder of bone, unspecified: Secondary | ICD-10-CM | POA: Diagnosis not present

## 2014-09-20 DIAGNOSIS — E785 Hyperlipidemia, unspecified: Secondary | ICD-10-CM | POA: Diagnosis not present

## 2014-09-23 ENCOUNTER — Other Ambulatory Visit: Payer: Self-pay | Admitting: *Deleted

## 2014-09-23 DIAGNOSIS — C3490 Malignant neoplasm of unspecified part of unspecified bronchus or lung: Secondary | ICD-10-CM

## 2014-09-23 MED ORDER — HYDROCODONE-ACETAMINOPHEN 5-325 MG PO TABS: 1.0000 | ORAL_TABLET | ORAL | Status: DC | PRN

## 2014-09-27 DIAGNOSIS — J45909 Unspecified asthma, uncomplicated: Secondary | ICD-10-CM | POA: Diagnosis not present

## 2014-09-27 DIAGNOSIS — E785 Hyperlipidemia, unspecified: Secondary | ICD-10-CM | POA: Diagnosis not present

## 2014-09-27 DIAGNOSIS — M859 Disorder of bone density and structure, unspecified: Secondary | ICD-10-CM | POA: Diagnosis not present

## 2014-09-27 DIAGNOSIS — G609 Hereditary and idiopathic neuropathy, unspecified: Secondary | ICD-10-CM | POA: Diagnosis not present

## 2014-11-07 ENCOUNTER — Other Ambulatory Visit: Payer: Self-pay | Admitting: Medical Oncology

## 2014-11-07 DIAGNOSIS — C3490 Malignant neoplasm of unspecified part of unspecified bronchus or lung: Secondary | ICD-10-CM

## 2014-11-07 MED ORDER — HYDROCODONE-ACETAMINOPHEN 5-325 MG PO TABS: 1.0000 | ORAL_TABLET | ORAL | Status: DC | PRN

## 2014-11-07 NOTE — Progress Notes (Signed)
Pt requests refill prescription for locked in injection room.

## 2014-12-12 ENCOUNTER — Telehealth: Payer: Self-pay | Admitting: *Deleted

## 2014-12-12 ENCOUNTER — Other Ambulatory Visit: Payer: Self-pay | Admitting: Medical Oncology

## 2014-12-12 DIAGNOSIS — C3490 Malignant neoplasm of unspecified part of unspecified bronchus or lung: Secondary | ICD-10-CM

## 2014-12-12 MED ORDER — HYDROCODONE-ACETAMINOPHEN 5-325 MG PO TABS: 1.0000 | ORAL_TABLET | ORAL | Status: DC | PRN

## 2014-12-12 NOTE — Progress Notes (Signed)
Rx locked in injection room

## 2014-12-12 NOTE — Telephone Encounter (Signed)
Patient called requesting refill on Hydrocodone.  Would like to pick this up tomorrow.  Last filled on 11-07-2014.  Will notify Provider.

## 2015-01-16 ENCOUNTER — Other Ambulatory Visit: Payer: Self-pay | Admitting: *Deleted

## 2015-01-16 DIAGNOSIS — C3490 Malignant neoplasm of unspecified part of unspecified bronchus or lung: Secondary | ICD-10-CM

## 2015-01-16 MED ORDER — HYDROCODONE-ACETAMINOPHEN 5-325 MG PO TABS: 1.0000 | ORAL_TABLET | ORAL | Status: DC | PRN

## 2015-01-16 NOTE — Telephone Encounter (Signed)
Refill medication: Norco. Informed patient it was printed off and available for pick up. Patient verbalized understanding.

## 2015-01-31 ENCOUNTER — Telehealth: Payer: Self-pay | Admitting: Internal Medicine

## 2015-01-31 ENCOUNTER — Telehealth: Payer: Self-pay | Admitting: *Deleted

## 2015-01-31 NOTE — Telephone Encounter (Signed)
s.w pt and r/s appt due to her son having dialysis...done....pt ok adn aware of new d.t

## 2015-01-31 NOTE — Telephone Encounter (Signed)
PT.'S SON CAN BRING PT. ON 02/21/15. CALLED PT. SHE CAN KEEP HER APPOINTMENTS ON 02/09/15 BUT HER SON IS UNABLE TO BRING PT. ON 02/16/15. SPOKE TO SCHEDULER, SHAMEEKA DANIELS. SHE WILL CALL PT. AND RESCHEDULE 02/16/15 APPOINTMENT.

## 2015-02-07 ENCOUNTER — Telehealth: Payer: Self-pay | Admitting: *Deleted

## 2015-02-07 ENCOUNTER — Other Ambulatory Visit: Payer: Self-pay | Admitting: *Deleted

## 2015-02-07 DIAGNOSIS — C3490 Malignant neoplasm of unspecified part of unspecified bronchus or lung: Secondary | ICD-10-CM

## 2015-02-07 MED ORDER — HYDROCODONE-ACETAMINOPHEN 5-325 MG PO TABS: 1.0000 | ORAL_TABLET | ORAL | Status: DC | PRN

## 2015-02-07 NOTE — Telephone Encounter (Signed)
Pain Medication refilled per MD. Pt aware.

## 2015-02-07 NOTE — Telephone Encounter (Signed)
Okay to refill her medication

## 2015-02-07 NOTE — Telephone Encounter (Signed)
TC from patient requesting refill on hydrocodone/apap 5/325  # 30. Last filled 01/16/15. Will be here to pick up on Thursday, 02/09/15. Please call pt to let her know script is ready.

## 2015-02-08 ENCOUNTER — Ambulatory Visit (HOSPITAL_COMMUNITY): Payer: Medicare Other

## 2015-02-08 ENCOUNTER — Other Ambulatory Visit: Payer: Medicare Other

## 2015-02-09 ENCOUNTER — Ambulatory Visit (HOSPITAL_COMMUNITY)
Admission: RE | Admit: 2015-02-09 | Discharge: 2015-02-09 | Disposition: A | Discharge status: Home / Self Care | Payer: Medicare Other | Source: Ambulatory Visit | Attending: Internal Medicine | Admitting: Internal Medicine

## 2015-02-09 ENCOUNTER — Other Ambulatory Visit (HOSPITAL_BASED_OUTPATIENT_CLINIC_OR_DEPARTMENT_OTHER): Payer: Medicare Other

## 2015-02-09 ENCOUNTER — Encounter (HOSPITAL_COMMUNITY): Payer: Self-pay

## 2015-02-09 DIAGNOSIS — Z87891 Personal history of nicotine dependence: Secondary | ICD-10-CM | POA: Insufficient documentation

## 2015-02-09 DIAGNOSIS — C3490 Malignant neoplasm of unspecified part of unspecified bronchus or lung: Secondary | ICD-10-CM | POA: Diagnosis not present

## 2015-02-09 DIAGNOSIS — C3411 Malignant neoplasm of upper lobe, right bronchus or lung: Secondary | ICD-10-CM

## 2015-02-09 DIAGNOSIS — C3412 Malignant neoplasm of upper lobe, left bronchus or lung: Secondary | ICD-10-CM | POA: Diagnosis present

## 2015-02-09 DIAGNOSIS — R590 Localized enlarged lymph nodes: Secondary | ICD-10-CM | POA: Diagnosis not present

## 2015-02-09 LAB — CBC WITH DIFFERENTIAL/PLATELET
BASO%: 0.7 % (ref 0.0–2.0)
BASOS ABS: 0 10*3/uL (ref 0.0–0.1)
EOS%: 1.6 % (ref 0.0–7.0)
Eosinophils Absolute: 0.1 10*3/uL (ref 0.0–0.5)
HEMATOCRIT: 37.2 % (ref 34.8–46.6)
HGB: 12.1 g/dL (ref 11.6–15.9)
LYMPH%: 21.7 % (ref 14.0–49.7)
MCH: 26.3 pg (ref 25.1–34.0)
MCHC: 32.4 g/dL (ref 31.5–36.0)
MCV: 81.3 fL (ref 79.5–101.0)
MONO#: 0.6 10*3/uL (ref 0.1–0.9)
MONO%: 9.9 % (ref 0.0–14.0)
NEUT#: 4.1 10*3/uL (ref 1.5–6.5)
NEUT%: 66.1 % (ref 38.4–76.8)
Platelets: 290 10*3/uL (ref 145–400)
RBC: 4.58 10*6/uL (ref 3.70–5.45)
RDW: 15.4 % — ABNORMAL HIGH (ref 11.2–14.5)
WBC: 6.2 10*3/uL (ref 3.9–10.3)
lymph#: 1.3 10*3/uL (ref 0.9–3.3)

## 2015-02-09 LAB — COMPREHENSIVE METABOLIC PANEL (CC13)
ALK PHOS: 83 U/L (ref 40–150)
ALT: 15 U/L (ref 0–55)
AST: 15 U/L (ref 5–34)
Albumin: 3.4 g/dL — ABNORMAL LOW (ref 3.5–5.0)
Anion Gap: 11 mEq/L (ref 3–11)
BILIRUBIN TOTAL: 0.47 mg/dL (ref 0.20–1.20)
BUN: 10.1 mg/dL (ref 7.0–26.0)
CO2: 26 meq/L (ref 22–29)
Calcium: 9.3 mg/dL (ref 8.4–10.4)
Chloride: 102 mEq/L (ref 98–109)
Creatinine: 0.8 mg/dL (ref 0.6–1.1)
EGFR: 82 mL/min/{1.73_m2} — AB (ref >90)
GLUCOSE: 88 mg/dL (ref 70–140)
Potassium: 4.5 mEq/L (ref 3.5–5.1)
Sodium: 139 mEq/L (ref 136–145)
Total Protein: 7.8 g/dL (ref 6.4–8.3)

## 2015-02-09 MED ORDER — IOHEXOL 300 MG/ML  SOLN
100.0000 mL | Freq: Once | INTRAMUSCULAR | Status: AC | PRN
  Administered 2015-02-09: 100 mL via INTRAVENOUS

## 2015-02-15 ENCOUNTER — Ambulatory Visit: Payer: Medicare Other | Admitting: Internal Medicine

## 2015-02-16 ENCOUNTER — Ambulatory Visit: Payer: Medicare Other | Admitting: Internal Medicine

## 2015-02-28 ENCOUNTER — Telehealth: Payer: Self-pay | Admitting: Internal Medicine

## 2015-02-28 ENCOUNTER — Ambulatory Visit (HOSPITAL_BASED_OUTPATIENT_CLINIC_OR_DEPARTMENT_OTHER): Payer: Medicare Other | Admitting: Internal Medicine

## 2015-02-28 ENCOUNTER — Encounter: Payer: Self-pay | Admitting: Internal Medicine

## 2015-02-28 VITALS — BP 149/82 | HR 105 | Temp 98.1°F | Resp 18 | Ht 65.0 in | Wt 141.0 lb

## 2015-02-28 DIAGNOSIS — C3411 Malignant neoplasm of upper lobe, right bronchus or lung: Secondary | ICD-10-CM

## 2015-02-28 DIAGNOSIS — C778 Secondary and unspecified malignant neoplasm of lymph nodes of multiple regions: Secondary | ICD-10-CM | POA: Diagnosis not present

## 2015-02-28 DIAGNOSIS — R5382 Chronic fatigue, unspecified: Secondary | ICD-10-CM

## 2015-02-28 DIAGNOSIS — R5383 Other fatigue: Secondary | ICD-10-CM | POA: Insufficient documentation

## 2015-02-28 NOTE — Progress Notes (Signed)
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   PRIOR THERAPY:  1. Status post palliative radiotherapy to the left lung mass under the care of Dr. Pablo Ledger completed on 03/06/2012.  2. Systemic chemotherapy with carboplatin for AUC of 6 on day 1 and Abraxane 100 mg/M2 on days 1, 8 and 15 every 3 weeks. Status post 2 cycles. From cycle 3 forward AUC will be decreased to 4.5 given on day 1 and the Abraxane will be decreased to 90 mg per meter squared on days 1, 8 and 15 every 3 weeks, Status post a total of 3 cycles.  3. Systemic chemotherapy with carboplatin for AUC of 5 on day 1 and gemcitabine 1000 mg/m2 given on day 1 and day 8 every 3 weeks,status post 1 cycle. Due to significant neutropenia she will be dosed reduced beginning cycle 2 forward to carboplatin at an AUC of 4 given on day 1 and gemcitabine at 800 mg per meter squared given on days 1 and 8 every 3 weeks. Status post 6 cycles.  4. Status post palliative radiotherapy to the right hilum under the care of Dr. Pablo Ledger completed on 11/26/2013.  CURRENT THERAPY: Immunotherapy with Nivolumab 3 MG/KG every 2 weeks. First dose expected 03/07/2015.  CHEMOTHERAPY INTENT: Palliative  CURRENT # OF CHEMOTHERAPY CYCLES: 1  CURRENT ANTIEMETICS: Compazine  CURRENT SMOKING STATUS: Former smoker  ORAL CHEMOTHERAPY AND CONSENT: None  CURRENT BISPHOSPHONATES USE: None  PAIN MANAGEMENT: 5/10 right shoulder currently on Norco  NARCOTICS INDUCED CONSTIPATION: None  LIVING WILL AND CODE STATUS: No CODE BLUE   INTERVAL HISTORY: Destiny Harrison 74 y.o. female returns to the clinic today for three-month followup visit accompanied by her son. The patient is feeling fine today with no specific complaints except for  mild persistent chronic back pain. She takes Vicodin for pain management on as-needed basis. She also has increasing dry cough as well as shortness breath with exertion that was getting worse recently.  She denied having any significant weight loss or night sweats. She has no chest pain, but has shortness of breath increased with exertion and dry, cough with no hemoptysis. The patient denied having any significant fever or chills, nausea or vomiting. She had repeat CT scan of the chest, abdomen and pelvis performed recently and she is here for evaluation and discussion of her scan results.  MEDICAL HISTORY: Past Medical History  Diagnosis Date  . COPD (chronic obstructive pulmonary disease)   . Neutropenia, drug-induced 05/05/2012  . Hyperlipidemia   . Rheumatoid arthritis(714.0)   . Asthma   . Hiatal hernia   . History of chemotherapy   . History of radiation therapy 03/06/2012    left hilum  . History of radiation therapy 05/10/2013-05/31/2013    Left lung/ 33/75'@2'$ .25 per fraction x 15 fractions  . Radiation 11/15/13-11/26/13    Right hilum 30 Gy in 10 fractions  . Non-small cell lung cancer dx'd 08/28/11    left lung    ALLERGIES:  is allergic to shellfish allergy.  MEDICATIONS:  Current Outpatient Prescriptions  Medication Sig Dispense Refill  . albuterol (PROVENTIL) (2.5 MG/3ML) 0.083% nebulizer solution Take 2.5 mg by nebulization every 8 (eight) hours.    Marland Kitchen albuterol (VENTOLIN HFA) 108 (90 BASE) MCG/ACT inhaler Inhale 2 puffs into the lungs every 3 (  three) hours as needed for wheezing or shortness of breath. 1 Inhaler 6  . calcium-vitamin D (OSCAL WITH D) 500-200 MG-UNIT per tablet Take 1 tablet by mouth daily.    . folic acid (FOLVITE) 400 MCG tablet Take 400 mcg by mouth daily.    Marland Kitchen HYDROcodone-acetaminophen (NORCO/VICODIN) 5-325 MG per tablet Take 1 tablet by mouth every 4 (four) hours as needed for moderate pain. 30 tablet 0  . ipratropium (ATROVENT) 0.02 % nebulizer solution Take  0.5 mg by nebulization every 8 (eight) hours.    . simvastatin (ZOCOR) 10 MG tablet   1  . simvastatin (ZOCOR) 5 MG tablet Take 5 mg by mouth at bedtime.    . predniSONE (DELTASONE) 5 MG tablet Take 5 mg by mouth daily.      No current facility-administered medications for this visit.    SURGICAL HISTORY:  Past Surgical History  Procedure Laterality Date  . Appendex  1962  . Video bronchoscopy  01/28/2012    Procedure: VIDEO BRONCHOSCOPY WITHOUT FLUORO;  Surgeon: Brand Males, MD;  Location: [facility] ENDOSCOPY;  Service: Endoscopy;  Laterality: Bilateral;  . Surgery on right wrist      REVIEW OF SYSTEMS:  Constitutional: positive for fatigue Eyes: negative Ears, nose, mouth, throat, and face: negative Respiratory: positive for cough and dyspnea on exertion Cardiovascular: negative Gastrointestinal: negative Genitourinary:negative Integument/breast: negative Hematologic/lymphatic: negative Musculoskeletal:negative Neurological: negative Behavioral/Psych: negative Endocrine: negative Allergic/Immunologic: negative   PHYSICAL EXAMINATION: General appearance: alert, cooperative and no distress Head: Normocephalic, without obvious abnormality, atraumatic Neck: no adenopathy, no JVD, supple, symmetrical, trachea midline and thyroid not enlarged, symmetric, no tenderness/mass/nodules Lymph nodes: Cervical, supraclavicular, and axillary nodes normal. Resp: clear to auscultation bilaterally Back: symmetric, no curvature. ROM normal. No CVA tenderness. Cardio: regular rate and rhythm, S1, S2 normal, no murmur, click, rub or gallop GI: soft, non-tender; bowel sounds normal; no masses,  no organomegaly Extremities: extremities normal, atraumatic, no cyanosis or edema Neurologic: Alert and oriented X 3, normal strength and tone. Normal symmetric reflexes. Normal coordination and gait  ECOG PERFORMANCE STATUS: 1 - Symptomatic but completely ambulatory  Blood pressure 149/82, pulse 105,  temperature 98.1 F (36.7 C), temperature source Oral, resp. rate 18, height '5\' 5"'$  (1.651 m), weight 141 lb (63.957 kg), SpO2 94 %.  LABORATORY DATA: Lab Results  Component Value Date   WBC 6.2 02/09/2015   HGB 12.1 02/09/2015   HCT 37.2 02/09/2015   MCV 81.3 02/09/2015   PLT 290 02/09/2015      Chemistry      Component Value Date/Time   NA 139 02/09/2015 0856   NA 135* 06/25/2014 0219   K 4.5 02/09/2015 0856   K 3.9 06/25/2014 0219   CL 97 06/25/2014 0219   CL 101 01/12/2013 0810   CO2 26 02/09/2015 0856   CO2 24 06/25/2014 0219   BUN 10.1 02/09/2015 0856   BUN 11 06/25/2014 0219   CREATININE 0.8 02/09/2015 0856   CREATININE 0.83 06/25/2014 0219      Component Value Date/Time   CALCIUM 9.3 02/09/2015 0856   CALCIUM 8.8 06/25/2014 0219   ALKPHOS 83 02/09/2015 0856   ALKPHOS 75 06/24/2014 2111   AST 15 02/09/2015 0856   AST 14 06/24/2014 2111   ALT 15 02/09/2015 0856   ALT 7 06/24/2014 2111   BILITOT 0.47 02/09/2015 0856   BILITOT 0.4 06/24/2014 2111       RADIOGRAPHIC STUDIES: Ct Chest W Contrast  02/09/2015   CLINICAL DATA:  Restaging lung  cancer.  EXAM: CT CHEST, ABDOMEN, AND PELVIS WITH CONTRAST  TECHNIQUE: Multidetector CT imaging of the chest, abdomen and pelvis was performed following the standard protocol during bolus administration of intravenous contrast.  CONTRAST:  147m OMNIPAQUE IOHEXOL 300 MG/ML  SOLN  COMPARISON:  Multiple prior chest CTs from 2015. The most recent is 08/04/2014. CT abdomen 04/28/2014  FINDINGS: CT CHEST FINDINGS  Chest wall: No breast masses or axillary lymphadenopathy. There are small scattered axillary lymph nodes bilaterally. There are 2 adjacent necrotic appearing supraclavicular nodes on the right side these measure 14 and 8.5 mm. There is also a left supraclavicular node on the left on image 3 which measures 8.5 mm. These are slightly larger when compared to the prior study. The bony thorax is intact. No destructive bone lesions or  spinal canal compromise. Stable compression deformity of T4 with moderate sclerosis.  Mediastinum: Interval increase in size of the right paratracheal lymphadenopathy. It most recently measured 25 x 14 mm and now measures 38 x 30 mm. A precarinal lymph node is also enlarged. It previously measured 8 mm and now measures 10 mm. No subcarinal adenopathy. The heart is normal in size. No pericardial effusion. The aorta is normal in caliber. Stable atherosclerotic calcifications at the arch. The esophagus is grossly normal.  Lungs: Enlarging right hilar mass. It measures 34.5 x 30 mm on image number 27 and previously measured 26 x 22 mm. No metastatic pulmonary nodules are identified. Stable emphysematous changes and radiation changes in the left paramediastinal region. No pleural effusion.  CT ABDOMEN AND PELVIS FINDINGS  Hepatobiliary: No focal hepatic lesions. Stable probable vascular shunt in segment 4B. No metastatic disease. No biliary dilatation. The gallbladder is normal. No common bile duct dilatation.  Pancreas: Normal and stable.  Spleen: Normal size.  No focal lesions.  Adrenals/Urinary Tract: The adrenal glands and kidneys are unremarkable and stable.  Stomach/Bowel: The stomach, duodenum, small bowel and colon are unremarkable. No inflammatory changes, mass lesions or obstructive findings.  Vascular/Lymphatic: No mesenteric or retroperitoneal mass or adenopathy. Small scattered lymph nodes are stable. Stable atherosclerotic calcifications involving the aorta and branch vessels.  Other: The uterus and ovaries are normal. The bladder is normal. No pelvic mass, adenopathy or free pelvic fluid collections. No inguinal mass or adenopathy.  Musculoskeletal: Stable mixed lytic and sclerotic process involving the right hip. 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 No findings to suggest osseous metastatic disease.  IMPRESSION: 1. Enlarging right hilar mass. 2. Progressive mediastinal lymphadenopathy and slight  progression bilateral supraclavicular lymph nodes. 3. No findings for pulmonary metastatic disease or metastatic disease involving the abdomen/pelvis. 4. 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   Electronically Signed   By: PMarijo SanesM.D.   On: 02/09/2015 11:49   Ct Abdomen Pelvis W Contrast  02/09/2015   CLINICAL DATA:  Restaging lung cancer.  EXAM: CT CHEST, ABDOMEN, AND PELVIS WITH CONTRAST  TECHNIQUE: Multidetector CT imaging of the chest, abdomen and pelvis was performed following the standard protocol during bolus administration of intravenous contrast.  CONTRAST:  1014mOMNIPAQUE IOHEXOL 300 MG/ML  SOLN  COMPARISON:  Multiple prior chest CTs from 2015. The most recent is 08/04/2014. CT abdomen 04/28/2014  FINDINGS: CT CHEST FINDINGS  Chest wall: No breast masses or axillary lymphadenopathy. There are small scattered axillary lymph nodes bilaterally. There are 2 adjacent necrotic appearing supraclavicular nodes on the right side these measure 14 and 8.5 mm. There is also a left supraclavicular  node on the left on image 3 which measures 8.5 mm. These are slightly larger when compared to the prior study. The bony thorax is intact. No destructive bone lesions or spinal canal compromise. Stable compression deformity of T4 with moderate sclerosis.  Mediastinum: Interval increase in size of the right paratracheal lymphadenopathy. It most recently measured 25 x 14 mm and now measures 38 x 30 mm. A precarinal lymph node is also enlarged. It previously measured 8 mm and now measures 10 mm. No subcarinal adenopathy. The heart is normal in size. No pericardial effusion. The aorta is normal in caliber. Stable atherosclerotic calcifications at the arch. The esophagus is grossly normal.  Lungs: Enlarging right hilar mass. It measures 34.5 x 30 mm on image number 27 and previously measured 26 x 22 mm. No metastatic pulmonary nodules are identified. Stable emphysematous  changes and radiation changes in the left paramediastinal region. No pleural effusion.  CT ABDOMEN AND PELVIS FINDINGS  Hepatobiliary: No focal hepatic lesions. Stable probable vascular shunt in segment 4B. No metastatic disease. No biliary dilatation. The gallbladder is normal. No common bile duct dilatation.  Pancreas: Normal and stable.  Spleen: Normal size.  No focal lesions.  Adrenals/Urinary Tract: The adrenal glands and kidneys are unremarkable and stable.  Stomach/Bowel: The stomach, duodenum, small bowel and colon are unremarkable. No inflammatory changes, mass lesions or obstructive findings.  Vascular/Lymphatic: No mesenteric or retroperitoneal mass or adenopathy. Small scattered lymph nodes are stable. Stable atherosclerotic calcifications involving the aorta and branch vessels.  Other: The uterus and ovaries are normal. The bladder is normal. No pelvic mass, adenopathy or free pelvic fluid collections. No inguinal mass or adenopathy.  Musculoskeletal: Stable mixed lytic and sclerotic process involving the right hip. 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 No findings to suggest osseous metastatic disease.  IMPRESSION: 1. Enlarging right hilar mass. 2. Progressive mediastinal lymphadenopathy and slight progression bilateral supraclavicular lymph nodes. 3. No findings for pulmonary metastatic disease or metastatic disease involving the abdomen/pelvis. 4. 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   Electronically Signed   By: Marijo Sanes M.D.   On: 02/09/2015 11:49   ASSESSMENT AND PLAN: This is a very pleasant 74 years old African American female with metastatic non-small cell lung cancer, squamous cell carcinoma status post several chemotherapy regimen and and completed a course of palliative radiotherapy to the right hilum in February 2015. She is currently on observation.  Her recent CT scan of the chest, abdomen and pelvis showed evidence for disease  progression with enlarging right hilar mass and progressive mediastinal lymphadenopathy as well as slight progression of bilateral supraclavicular lymph nodes. I discussed the scan results with the patient and her son. I discussed with the patient several options for treatment of her condition including palliative care and hospice referral versus consideration of treatment with immunotherapy with Nivolumab versus other chemotherapy options including docetaxel and Cyramza. The patient is interested in the immunotherapy. I discussed with her the treatment with Nivolumab 3 MG/KG every 2 weeks. I discussed with the patient adverse effect of this treatment including but not limited to immune mediated the skin rash, diarrhea, pneumonitis, liver or renal dysfunction as well as endocrine dysfunction. The patient also received a handout about the drug and the toxicity. She would like to proceed with the treatment as planned. She is expected to start the first cycle of this treatment next week. The patient would come back for follow-up visit in 3 weeks for  reevaluation with the start of cycle #2 of her immunotherapy. She received a refill of her pain medication. She was advised to call immediately if she has any concerning symptoms in the interval.  The patient voices understanding of current disease status and treatment options and is in agreement with the current care plan.  All questions were answered. The patient knows to call the clinic with any problems, questions or concerns. We can certainly see the patient much sooner if necessary.  Disclaimer: This note was dictated with voice recognition software. Similar sounding words can inadvertently be transcribed and may not be corrected upon review.

## 2015-02-28 NOTE — Telephone Encounter (Signed)
Gave and printed appt sched and avs for pt for May and JUNE

## 2015-03-06 ENCOUNTER — Telehealth: Payer: Self-pay | Admitting: *Deleted

## 2015-03-06 DIAGNOSIS — C3490 Malignant neoplasm of unspecified part of unspecified bronchus or lung: Secondary | ICD-10-CM

## 2015-03-06 MED ORDER — HYDROCODONE-ACETAMINOPHEN 5-325 MG PO TABS: 1.0000 | ORAL_TABLET | ORAL | Status: DC | PRN

## 2015-03-06 NOTE — Telephone Encounter (Signed)
TC from patient. She states she needs her hydrocodone/acetaminophen 5/325 refilled. She has chemo scheduled for tomorrow and get the prescription tomorrow. Printed prescription for Dr. Julien Nordmann to sign

## 2015-03-07 ENCOUNTER — Other Ambulatory Visit (HOSPITAL_BASED_OUTPATIENT_CLINIC_OR_DEPARTMENT_OTHER): Payer: Medicare Other

## 2015-03-07 ENCOUNTER — Ambulatory Visit (HOSPITAL_BASED_OUTPATIENT_CLINIC_OR_DEPARTMENT_OTHER): Payer: Medicare Other

## 2015-03-07 ENCOUNTER — Other Ambulatory Visit: Payer: Self-pay | Admitting: Medical Oncology

## 2015-03-07 ENCOUNTER — Other Ambulatory Visit: Payer: Medicare Other

## 2015-03-07 DIAGNOSIS — C3490 Malignant neoplasm of unspecified part of unspecified bronchus or lung: Secondary | ICD-10-CM

## 2015-03-07 DIAGNOSIS — R5382 Chronic fatigue, unspecified: Secondary | ICD-10-CM | POA: Diagnosis present

## 2015-03-07 DIAGNOSIS — C3411 Malignant neoplasm of upper lobe, right bronchus or lung: Secondary | ICD-10-CM | POA: Diagnosis not present

## 2015-03-07 DIAGNOSIS — Z5112 Encounter for antineoplastic immunotherapy: Secondary | ICD-10-CM | POA: Diagnosis present

## 2015-03-07 LAB — CBC WITH DIFFERENTIAL/PLATELET
BASO%: 0.2 % (ref 0.0–2.0)
BASOS ABS: 0 10*3/uL (ref 0.0–0.1)
EOS ABS: 0.4 10*3/uL (ref 0.0–0.5)
EOS%: 7.7 % — AB (ref 0.0–7.0)
HCT: 35.9 % (ref 34.8–46.6)
HGB: 11.9 g/dL (ref 11.6–15.9)
LYMPH#: 1.3 10*3/uL (ref 0.9–3.3)
LYMPH%: 22.9 % (ref 14.0–49.7)
MCH: 27.1 pg (ref 25.1–34.0)
MCHC: 33.1 g/dL (ref 31.5–36.0)
MCV: 81.8 fL (ref 79.5–101.0)
MONO#: 0.6 10*3/uL (ref 0.1–0.9)
MONO%: 11.6 % (ref 0.0–14.0)
NEUT#: 3.1 10*3/uL (ref 1.5–6.5)
NEUT%: 57.6 % (ref 38.4–76.8)
Platelets: 268 10*3/uL (ref 145–400)
RBC: 4.39 10*6/uL (ref 3.70–5.45)
RDW: 14.3 % (ref 11.2–14.5)
WBC: 5.5 10*3/uL (ref 3.9–10.3)

## 2015-03-07 LAB — COMPREHENSIVE METABOLIC PANEL (CC13)
ALBUMIN: 3.3 g/dL — AB (ref 3.5–5.0)
ALK PHOS: 78 U/L (ref 40–150)
ALT: 13 U/L (ref 0–55)
AST: 17 U/L (ref 5–34)
Anion Gap: 13 mEq/L — ABNORMAL HIGH (ref 3–11)
BUN: 9.3 mg/dL (ref 7.0–26.0)
CALCIUM: 9.1 mg/dL (ref 8.4–10.4)
CHLORIDE: 102 meq/L (ref 98–109)
CO2: 27 mEq/L (ref 22–29)
Creatinine: 0.8 mg/dL (ref 0.6–1.1)
EGFR: 82 mL/min/{1.73_m2} — ABNORMAL LOW (ref >90)
Glucose: 82 mg/dl (ref 70–140)
POTASSIUM: 4.2 meq/L (ref 3.5–5.1)
SODIUM: 142 meq/L (ref 136–145)
TOTAL PROTEIN: 6.8 g/dL (ref 6.4–8.3)
Total Bilirubin: 0.53 mg/dL (ref 0.20–1.20)

## 2015-03-07 LAB — TSH CHCC: TSH: 1.801 m(IU)/L (ref 0.308–3.960)

## 2015-03-07 MED ORDER — SODIUM CHLORIDE 0.9 % IV SOLN
3.1000 mg/kg | Freq: Once | INTRAVENOUS | Status: AC
  Administered 2015-03-07: 200 mg via INTRAVENOUS
  Filled 2015-03-07: qty 20

## 2015-03-07 MED ORDER — HEPARIN SOD (PORK) LOCK FLUSH 100 UNIT/ML IV SOLN
500.0000 [IU] | Freq: Once | INTRAVENOUS | Status: AC | PRN
  Administered 2015-03-07: 500 [IU]
  Filled 2015-03-07: qty 5

## 2015-03-07 MED ORDER — SODIUM CHLORIDE 0.9 % IV SOLN
Freq: Once | INTRAVENOUS | Status: AC
  Administered 2015-03-07: 15:00:00 via INTRAVENOUS

## 2015-03-07 MED ORDER — SODIUM CHLORIDE 0.9 % IJ SOLN
10.0000 mL | INTRAMUSCULAR | Status: DC | PRN
  Administered 2015-03-07: 10 mL
  Filled 2015-03-07: qty 10

## 2015-03-07 NOTE — Patient Instructions (Signed)
[facility] Discharge Instructions for Patients Receiving Chemotherapy  Today you received the following chemotherapy agents Nivolumab.  To help prevent nausea and vomiting after your treatment, we encourage you to take your nausea medication as prescribed.   If you develop nausea and vomiting that is not controlled by your nausea medication, call the clinic.   BELOW ARE SYMPTOMS THAT SHOULD BE REPORTED IMMEDIATELY:  *FEVER GREATER THAN 100.5 F  *CHILLS WITH OR WITHOUT FEVER  NAUSEA AND VOMITING THAT IS NOT CONTROLLED WITH YOUR NAUSEA MEDICATION  *UNUSUAL SHORTNESS OF BREATH  *UNUSUAL BRUISING OR BLEEDING  TENDERNESS IN MOUTH AND THROAT WITH OR WITHOUT PRESENCE OF ULCERS  *URINARY PROBLEMS  *BOWEL PROBLEMS  UNUSUAL RASH Items with * indicate a potential emergency and should be followed up as soon as possible.  Feel free to call the clinic you have any questions or concerns. The clinic phone number is [phone].  Please show the CHEMO ALERT CARD at check-in to the Emergency Department and triage nurse.   

## 2015-03-08 ENCOUNTER — Telehealth: Payer: Self-pay | Admitting: Medical Oncology

## 2015-03-08 NOTE — Telephone Encounter (Signed)
-----   Message from Rosalie Gums, RN sent at 03/08/2015  9:35 AM EDT ----- Regarding: New PT-Mohamed Please call for new chemo follow up of Nivolumab. Thanks!

## 2015-03-08 NOTE — Telephone Encounter (Signed)
Had Nivolumab yesterday and starting at 530 pm  I could barely lift my arms. I took hydrocodone and it helped a little , but I had a rough night." I told her to continue to take her pain med prn and to call in the next day or two if symptom not any better. I did explain to her that her  immune system is stimulated and probably causing the aches and discomfort.

## 2015-03-21 ENCOUNTER — Ambulatory Visit (HOSPITAL_BASED_OUTPATIENT_CLINIC_OR_DEPARTMENT_OTHER): Payer: Medicare Other

## 2015-03-21 ENCOUNTER — Ambulatory Visit (HOSPITAL_BASED_OUTPATIENT_CLINIC_OR_DEPARTMENT_OTHER): Payer: Medicare Other | Admitting: Physician Assistant

## 2015-03-21 ENCOUNTER — Other Ambulatory Visit (HOSPITAL_BASED_OUTPATIENT_CLINIC_OR_DEPARTMENT_OTHER): Payer: Medicare Other

## 2015-03-21 ENCOUNTER — Encounter: Payer: Self-pay | Admitting: Physician Assistant

## 2015-03-21 VITALS — BP 147/68 | HR 95 | Temp 98.5°F | Resp 18 | Ht 65.0 in | Wt 137.7 lb

## 2015-03-21 DIAGNOSIS — C3411 Malignant neoplasm of upper lobe, right bronchus or lung: Secondary | ICD-10-CM | POA: Diagnosis present

## 2015-03-21 DIAGNOSIS — M545 Low back pain: Secondary | ICD-10-CM

## 2015-03-21 DIAGNOSIS — C3412 Malignant neoplasm of upper lobe, left bronchus or lung: Secondary | ICD-10-CM | POA: Diagnosis not present

## 2015-03-21 DIAGNOSIS — R0602 Shortness of breath: Secondary | ICD-10-CM | POA: Diagnosis not present

## 2015-03-21 DIAGNOSIS — R05 Cough: Secondary | ICD-10-CM | POA: Diagnosis not present

## 2015-03-21 DIAGNOSIS — Z5112 Encounter for antineoplastic immunotherapy: Secondary | ICD-10-CM | POA: Diagnosis present

## 2015-03-21 DIAGNOSIS — G8929 Other chronic pain: Secondary | ICD-10-CM | POA: Diagnosis not present

## 2015-03-21 DIAGNOSIS — C778 Secondary and unspecified malignant neoplasm of lymph nodes of multiple regions: Secondary | ICD-10-CM

## 2015-03-21 DIAGNOSIS — C3490 Malignant neoplasm of unspecified part of unspecified bronchus or lung: Secondary | ICD-10-CM

## 2015-03-21 LAB — COMPREHENSIVE METABOLIC PANEL (CC13)
ALBUMIN: 3.1 g/dL — AB (ref 3.5–5.0)
ALK PHOS: 75 U/L (ref 40–150)
ALT: 6 U/L (ref 0–55)
ANION GAP: 9 meq/L (ref 3–11)
AST: 14 U/L (ref 5–34)
BUN: 8.8 mg/dL (ref 7.0–26.0)
CALCIUM: 8.9 mg/dL (ref 8.4–10.4)
CO2: 27 mEq/L (ref 22–29)
Chloride: 106 mEq/L (ref 98–109)
Creatinine: 0.8 mg/dL (ref 0.6–1.1)
EGFR: 90 mL/min/{1.73_m2} — ABNORMAL LOW (ref >90)
Glucose: 79 mg/dl (ref 70–140)
POTASSIUM: 3.7 meq/L (ref 3.5–5.1)
SODIUM: 142 meq/L (ref 136–145)
Total Bilirubin: 0.43 mg/dL (ref 0.20–1.20)
Total Protein: 7.2 g/dL (ref 6.4–8.3)

## 2015-03-21 LAB — CBC WITH DIFFERENTIAL/PLATELET
BASO%: 0.6 % (ref 0.0–2.0)
Basophils Absolute: 0 10*3/uL (ref 0.0–0.1)
EOS%: 7 % (ref 0.0–7.0)
Eosinophils Absolute: 0.4 10*3/uL (ref 0.0–0.5)
HEMATOCRIT: 33.9 % — AB (ref 34.8–46.6)
HGB: 11.2 g/dL — ABNORMAL LOW (ref 11.6–15.9)
LYMPH%: 23.4 % (ref 14.0–49.7)
MCH: 27.3 pg (ref 25.1–34.0)
MCHC: 33 g/dL (ref 31.5–36.0)
MCV: 82.6 fL (ref 79.5–101.0)
MONO#: 0.7 10*3/uL (ref 0.1–0.9)
MONO%: 13.9 % (ref 0.0–14.0)
NEUT#: 2.9 10*3/uL (ref 1.5–6.5)
NEUT%: 55.1 % (ref 38.4–76.8)
PLATELETS: 353 10*3/uL (ref 145–400)
RBC: 4.1 10*6/uL (ref 3.70–5.45)
RDW: 15 % — ABNORMAL HIGH (ref 11.2–14.5)
WBC: 5.3 10*3/uL (ref 3.9–10.3)
lymph#: 1.2 10*3/uL (ref 0.9–3.3)

## 2015-03-21 MED ORDER — SODIUM CHLORIDE 0.9 % IV SOLN
Freq: Once | INTRAVENOUS | Status: AC
  Administered 2015-03-21: 16:00:00 via INTRAVENOUS

## 2015-03-21 MED ORDER — SODIUM CHLORIDE 0.9 % IV SOLN
3.1000 mg/kg | Freq: Once | INTRAVENOUS | Status: AC
  Administered 2015-03-21: 200 mg via INTRAVENOUS
  Filled 2015-03-21: qty 20

## 2015-03-21 MED ORDER — HEPARIN SOD (PORK) LOCK FLUSH 100 UNIT/ML IV SOLN
500.0000 [IU] | Freq: Once | INTRAVENOUS | Status: AC | PRN
  Administered 2015-03-21: 500 [IU]
  Filled 2015-03-21: qty 5

## 2015-03-21 MED ORDER — SODIUM CHLORIDE 0.9 % IJ SOLN
10.0000 mL | INTRAMUSCULAR | Status: DC | PRN
  Administered 2015-03-21: 10 mL
  Filled 2015-03-21: qty 10

## 2015-03-21 MED ORDER — PROCHLORPERAZINE MALEATE 10 MG PO TABS: 10.0000 mg | ORAL_TABLET | Freq: Four times a day (QID) | ORAL | Status: DC | PRN

## 2015-03-21 NOTE — Progress Notes (Addendum)
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   PRIOR THERAPY:  1. Status post palliative radiotherapy to the left lung mass under the care of Dr. Pablo Ledger completed on 03/06/2012.  2. Systemic chemotherapy with carboplatin for AUC of 6 on day 1 and Abraxane 100 mg/M2 on days 1, 8 and 15 every 3 weeks. Status post 2 cycles. From cycle 3 forward AUC will be decreased to 4.5 given on day 1 and the Abraxane will be decreased to 90 mg per meter squared on days 1, 8 and 15 every 3 weeks, Status post a total of 3 cycles.  3. Systemic chemotherapy with carboplatin for AUC of 5 on day 1 and gemcitabine 1000 mg/m2 given on day 1 and day 8 every 3 weeks,status post 1 cycle. Due to significant neutropenia she will be dosed reduced beginning cycle 2 forward to carboplatin at an AUC of 4 given on day 1 and gemcitabine at 800 mg per meter squared given on days 1 and 8 every 3 weeks. Status post 6 cycles.  4. Status post palliative radiotherapy to the right hilum under the care of Dr. Pablo Ledger completed on 11/26/2013.  CURRENT THERAPY: Immunotherapy with Nivolumab 3 MG/KG every 2 weeks. First dose expected 03/07/2015. Status post 1 cycle.  CHEMOTHERAPY INTENT: Palliative  CURRENT # OF CHEMOTHERAPY CYCLES: 2  CURRENT ANTIEMETICS: Compazine  CURRENT SMOKING STATUS: Former smoker  ORAL CHEMOTHERAPY AND CONSENT: None  CURRENT BISPHOSPHONATES USE: None  PAIN MANAGEMENT: 5/10 right shoulder currently on Norco  NARCOTICS INDUCED CONSTIPATION: None  LIVING WILL AND CODE STATUS: No CODE BLUE   INTERVAL HISTORY: Kalyna Paolella 74 y.o. female returns to the clinic today for three-month followup visit accompanied by her son. The patient is feeling fine today with no specific  complaints except for mild persistent chronic back pain. She takes Vicodin for pain management on as-needed basis. She also continues to have a nonproductive cough. She tolerated her first cycle of immunotherapy with Nivolumab relatively well with the exception of 2-3 days of malaise after the treatment. She denied any changes to her baseline shortness of breath, denied skin rash or diarrhea. She reports that she has no medications at home to address nausea although she is not currently experiencing any symptoms.  She denied having any significant weight loss or night sweats. She has no chest pain, but has shortness of breath increased with exertion and dry, cough with no hemoptysis. The patient denied having any significant fever or chills, nausea or vomiting.   MEDICAL HISTORY: Past Medical History  Diagnosis Date  . COPD (chronic obstructive pulmonary disease)   . Neutropenia, drug-induced 05/05/2012  . Hyperlipidemia   . Rheumatoid arthritis(714.0)   . Asthma   . Hiatal hernia   . History of chemotherapy   . History of radiation therapy 03/06/2012    left hilum  . History of radiation therapy 05/10/2013-05/31/2013    Left lung/ 33/75'@2'$ .25 per fraction x 15 fractions  . Radiation 11/15/13-11/26/13    Right hilum 30 Gy in 10 fractions  . Non-small cell lung cancer dx'd 08/28/11    left lung    ALLERGIES:  is allergic to shellfish allergy.  MEDICATIONS:  Current Outpatient Prescriptions  Medication Sig Dispense Refill  . albuterol (PROVENTIL) (2.5 MG/3ML) 0.083% nebulizer solution Take 2.5  mg by nebulization every 8 (eight) hours.    Marland Kitchen albuterol (VENTOLIN HFA) 108 (90 BASE) MCG/ACT inhaler Inhale 2 puffs into the lungs every 3 (three) hours as needed for wheezing or shortness of breath. 1 Inhaler 6  . calcium-vitamin D (OSCAL WITH D) 500-200 MG-UNIT per tablet Take 1 tablet by mouth daily.    . folic acid (FOLVITE) 462 MCG tablet Take 400 mcg by mouth daily.    Marland Kitchen HYDROcodone-acetaminophen  (NORCO/VICODIN) 5-325 MG per tablet Take 1 tablet by mouth every 4 (four) hours as needed for moderate pain. 30 tablet 0  . ipratropium (ATROVENT) 0.02 % nebulizer solution Take 0.5 mg by nebulization every 8 (eight) hours.    . predniSONE (DELTASONE) 5 MG tablet Take 5 mg by mouth daily.     . simvastatin (ZOCOR) 5 MG tablet Take 5 mg by mouth at bedtime.    . prochlorperazine (COMPAZINE) 10 MG tablet Take 1 tablet (10 mg total) by mouth every 6 (six) hours as needed for nausea or vomiting. 30 tablet 0   No current facility-administered medications for this visit.   Facility-Administered Medications Ordered in Other Visits  Medication Dose Route Frequency Provider Last Rate Last Dose  . 0.9 %  sodium chloride infusion   Intravenous Once Curt Bears, MD      . heparin lock flush 100 unit/mL  500 Units Intracatheter Once PRN Curt Bears, MD      . nivolumab (OPDIVO) 200 mg in sodium chloride 0.9 % 100 mL chemo infusion  3.1 mg/kg (Treatment Plan Actual) Intravenous Once Curt Bears, MD      . sodium chloride 0.9 % injection 10 mL  10 mL Intracatheter PRN Curt Bears, MD        SURGICAL HISTORY:  Past Surgical History  Procedure Laterality Date  . Appendex  1962  . Video bronchoscopy  01/28/2012    Procedure: VIDEO BRONCHOSCOPY WITHOUT FLUORO;  Surgeon: Brand Males, MD;  Location: [facility] ENDOSCOPY;  Service: Endoscopy;  Laterality: Bilateral;  . Surgery on right wrist      REVIEW OF SYSTEMS:  Constitutional: positive for fatigue and malaise Eyes: negative Ears, nose, mouth, throat, and face: negative Respiratory: positive for cough and dyspnea on exertion Cardiovascular: negative Gastrointestinal: negative Genitourinary:negative Integument/breast: negative Hematologic/lymphatic: negative Musculoskeletal:negative Neurological: negative Behavioral/Psych: negative Endocrine: negative Allergic/Immunologic: negative   PHYSICAL EXAMINATION: General appearance: alert,  cooperative and no distress Head: Normocephalic, without obvious abnormality, atraumatic Neck: no adenopathy, no JVD, supple, symmetrical, trachea midline and thyroid not enlarged, symmetric, no tenderness/mass/nodules Lymph nodes: Cervical, supraclavicular, and axillary nodes normal. Resp: clear to auscultation bilaterally Back: symmetric, no curvature. ROM normal. No CVA tenderness. Cardio: regular rate and rhythm, S1, S2 normal, no murmur, click, rub or gallop GI: soft, non-tender; bowel sounds normal; no masses,  no organomegaly Extremities: extremities normal, atraumatic, no cyanosis or edema Neurologic: Alert and oriented X 3, normal strength and tone. Normal symmetric reflexes. Normal coordination and gait  ECOG PERFORMANCE STATUS: 1 - Symptomatic but completely ambulatory  Blood pressure 147/68, pulse 95, temperature 98.5 F (36.9 C), temperature source Oral, resp. rate 18, height '5\' 5"'$  (1.651 m), weight 137 lb 11.2 oz (62.46 kg), SpO2 96 %.  LABORATORY DATA: Lab Results  Component Value Date   WBC 5.3 03/21/2015   HGB 11.2* 03/21/2015   HCT 33.9* 03/21/2015   MCV 82.6 03/21/2015   PLT 353 03/21/2015      Chemistry      Component Value Date/Time   NA 142  03/21/2015 1359   NA 135* 06/25/2014 0219   K 3.7 03/21/2015 1359   K 3.9 06/25/2014 0219   CL 97 06/25/2014 0219   CL 101 01/12/2013 0810   CO2 27 03/21/2015 1359   CO2 24 06/25/2014 0219   BUN 8.8 03/21/2015 1359   BUN 11 06/25/2014 0219   CREATININE 0.8 03/21/2015 1359   CREATININE 0.83 06/25/2014 0219      Component Value Date/Time   CALCIUM 8.9 03/21/2015 1359   CALCIUM 8.8 06/25/2014 0219   ALKPHOS 75 03/21/2015 1359   ALKPHOS 75 06/24/2014 2111   AST 14 03/21/2015 1359   AST 14 06/24/2014 2111   ALT 6 03/21/2015 1359   ALT 7 06/24/2014 2111   BILITOT 0.43 03/21/2015 1359   BILITOT 0.4 06/24/2014 2111       RADIOGRAPHIC STUDIES: No results found. ASSESSMENT AND PLAN: This is a very pleasant 74  years old African American female with metastatic non-small cell lung cancer, squamous cell carcinoma status post several chemotherapy regimen and and completed a course of palliative radiotherapy to the right hilum in February 2015. She is currently on observation.  Her recent CT scan of the chest, abdomen and pelvis showed evidence for disease progression with enlarging right hilar mass and progressive mediastinal lymphadenopathy as well as slight progression of bilateral supraclavicular lymph nodes. She is currently receiving treatment with Nivolumab 3 MG/KG every 2 weeks. Status post 1 cycle. Overall she tolerated the first cycle relatively well with the exception of a few days of malaise after her treatment. Patient was discussed with and also seen by Dr. Julien Nordmann. She will proceed with cycle #2 of her immunotherapy with  Nivolumab today as scheduled. She'll follow-up in 2 weeks prior to cycle #3. A prescription for Compazine was sent to her pharmacy of record via E scribe to be used at if needed for nausea.   She was advised to call immediately if she has any concerning symptoms in the interval.  The patient voices understanding of current disease status and treatment options and is in agreement with the current care plan.  All questions were answered. The patient knows to call the clinic with any problems, questions or concerns. We can certainly see the patient much sooner if necessary.  Carlton Adam PA-C   ADDENDUM: Hematology/Oncology Attending: I had a face to face encounter with the patient. I recommended her care plan. This is a very pleasant 74 years old African-American female with metastatic non-small cell lung cancer, squamous cell carcinoma who had evidence for disease progression recently and the patient was started on treatment with immunotherapy with Nivolumab status post 1 cycle. She tolerated the first cycle of her immunotherapy fairly well with no significant adverse  effects. I recommended for the patient to proceed with cycle #2 today as scheduled. She will come back for follow-up visit in 2 weeks for reevaluation before starting cycle #3.  The patient was advised to call immediately if she has any concerning symptoms in the interval.  Disclaimer: This note was dictated with voice recognition software. Similar sounding words can inadvertently be transcribed and may not be corrected upon review. Eilleen Kempf., MD 03/27/2015

## 2015-03-21 NOTE — Patient Instructions (Signed)
[facility] Discharge Instructions for Patients Receiving Chemotherapy  Today you received the following chemotherapy agents:  Nivolumab.  To help prevent nausea and vomiting after your treatment, we encourage you to take your nausea medication as directed.   If you develop nausea and vomiting that is not controlled by your nausea medication, call the clinic.   BELOW ARE SYMPTOMS THAT SHOULD BE REPORTED IMMEDIATELY:  *FEVER GREATER THAN 100.5 F  *CHILLS WITH OR WITHOUT FEVER  NAUSEA AND VOMITING THAT IS NOT CONTROLLED WITH YOUR NAUSEA MEDICATION  *UNUSUAL SHORTNESS OF BREATH  *UNUSUAL BRUISING OR BLEEDING  TENDERNESS IN MOUTH AND THROAT WITH OR WITHOUT PRESENCE OF ULCERS  *URINARY PROBLEMS  *BOWEL PROBLEMS  UNUSUAL RASH Items with * indicate a potential emergency and should be followed up as soon as possible.  Feel free to call the clinic you have any questions or concerns. The clinic phone number is [phone].  Please show the CHEMO ALERT CARD at check-in to the Emergency Department and triage nurse.   

## 2015-03-22 NOTE — Patient Instructions (Signed)
Follow-up in 2 weeks prior to next scheduled cycle of immunotherapy 

## 2015-03-28 DIAGNOSIS — Z1389 Encounter for screening for other disorder: Secondary | ICD-10-CM | POA: Diagnosis not present

## 2015-03-28 DIAGNOSIS — Z0000 Encounter for general adult medical examination without abnormal findings: Secondary | ICD-10-CM | POA: Diagnosis not present

## 2015-03-28 DIAGNOSIS — Z23 Encounter for immunization: Secondary | ICD-10-CM | POA: Diagnosis not present

## 2015-03-28 DIAGNOSIS — N390 Urinary tract infection, site not specified: Secondary | ICD-10-CM | POA: Diagnosis not present

## 2015-03-28 DIAGNOSIS — M859 Disorder of bone density and structure, unspecified: Secondary | ICD-10-CM | POA: Diagnosis not present

## 2015-03-28 DIAGNOSIS — E785 Hyperlipidemia, unspecified: Secondary | ICD-10-CM | POA: Diagnosis not present

## 2015-03-31 ENCOUNTER — Telehealth: Payer: Self-pay | Admitting: Internal Medicine

## 2015-03-31 MED ORDER — ALBUTEROL SULFATE HFA 108 (90 BASE) MCG/ACT IN AERS: 2.0000 | INHALATION_SPRAY | RESPIRATORY_TRACT | Status: DC | PRN

## 2015-03-31 NOTE — Telephone Encounter (Signed)
Spoke with pt. States Proair is too expensive for her to afford. Called pt's pharmacy. They state she picked up Proair last time and the cost was $35. Sending Proventil to her pharmacy. Per Daneil Dan MR is ok with this. Nothing further needed at this time.

## 2015-04-04 ENCOUNTER — Ambulatory Visit: Payer: Medicare Other

## 2015-04-04 ENCOUNTER — Encounter: Payer: Self-pay | Admitting: Oncology

## 2015-04-04 ENCOUNTER — Other Ambulatory Visit (HOSPITAL_BASED_OUTPATIENT_CLINIC_OR_DEPARTMENT_OTHER): Payer: Medicare Other

## 2015-04-04 ENCOUNTER — Telehealth: Payer: Self-pay | Admitting: Oncology

## 2015-04-04 ENCOUNTER — Ambulatory Visit (HOSPITAL_BASED_OUTPATIENT_CLINIC_OR_DEPARTMENT_OTHER): Payer: Medicare Other

## 2015-04-04 ENCOUNTER — Ambulatory Visit (HOSPITAL_BASED_OUTPATIENT_CLINIC_OR_DEPARTMENT_OTHER): Payer: Medicare Other | Admitting: Oncology

## 2015-04-04 VITALS — BP 116/67 | HR 122 | Temp 98.2°F | Resp 18 | Ht 65.0 in | Wt 135.8 lb

## 2015-04-04 DIAGNOSIS — G8929 Other chronic pain: Secondary | ICD-10-CM | POA: Diagnosis not present

## 2015-04-04 DIAGNOSIS — C3412 Malignant neoplasm of upper lobe, left bronchus or lung: Secondary | ICD-10-CM | POA: Diagnosis not present

## 2015-04-04 DIAGNOSIS — C3490 Malignant neoplasm of unspecified part of unspecified bronchus or lung: Secondary | ICD-10-CM

## 2015-04-04 DIAGNOSIS — R05 Cough: Secondary | ICD-10-CM

## 2015-04-04 DIAGNOSIS — Z5112 Encounter for antineoplastic immunotherapy: Secondary | ICD-10-CM

## 2015-04-04 DIAGNOSIS — M545 Low back pain: Secondary | ICD-10-CM

## 2015-04-04 DIAGNOSIS — R5383 Other fatigue: Secondary | ICD-10-CM | POA: Diagnosis not present

## 2015-04-04 DIAGNOSIS — Z79899 Other long term (current) drug therapy: Secondary | ICD-10-CM

## 2015-04-04 DIAGNOSIS — C3411 Malignant neoplasm of upper lobe, right bronchus or lung: Secondary | ICD-10-CM

## 2015-04-04 DIAGNOSIS — R5382 Chronic fatigue, unspecified: Secondary | ICD-10-CM

## 2015-04-04 LAB — CBC WITH DIFFERENTIAL/PLATELET
BASO%: 0.4 % (ref 0.0–2.0)
Basophils Absolute: 0 10*3/uL (ref 0.0–0.1)
EOS ABS: 0.4 10*3/uL (ref 0.0–0.5)
EOS%: 7 % (ref 0.0–7.0)
HCT: 33.9 % — ABNORMAL LOW (ref 34.8–46.6)
HGB: 11.4 g/dL — ABNORMAL LOW (ref 11.6–15.9)
LYMPH%: 23 % (ref 14.0–49.7)
MCH: 27.1 pg (ref 25.1–34.0)
MCHC: 33.6 g/dL (ref 31.5–36.0)
MCV: 80.7 fL (ref 79.5–101.0)
MONO#: 0.7 10*3/uL (ref 0.1–0.9)
MONO%: 13.3 % (ref 0.0–14.0)
NEUT%: 56.3 % (ref 38.4–76.8)
NEUTROS ABS: 2.9 10*3/uL (ref 1.5–6.5)
PLATELETS: 298 10*3/uL (ref 145–400)
RBC: 4.2 10*6/uL (ref 3.70–5.45)
RDW: 13.9 % (ref 11.2–14.5)
WBC: 5.2 10*3/uL (ref 3.9–10.3)
lymph#: 1.2 10*3/uL (ref 0.9–3.3)

## 2015-04-04 LAB — COMPREHENSIVE METABOLIC PANEL (CC13)
ALT: 10 U/L (ref 0–55)
ANION GAP: 12 meq/L — AB (ref 3–11)
AST: 16 U/L (ref 5–34)
Albumin: 3 g/dL — ABNORMAL LOW (ref 3.5–5.0)
Alkaline Phosphatase: 71 U/L (ref 40–150)
BUN: 8.7 mg/dL (ref 7.0–26.0)
CO2: 27 meq/L (ref 22–29)
Calcium: 9.5 mg/dL (ref 8.4–10.4)
Chloride: 102 mEq/L (ref 98–109)
Creatinine: 0.9 mg/dL (ref 0.6–1.1)
EGFR: 73 mL/min/{1.73_m2} — AB (ref >90)
GLUCOSE: 98 mg/dL (ref 70–140)
POTASSIUM: 3.9 meq/L (ref 3.5–5.1)
Sodium: 141 mEq/L (ref 136–145)
TOTAL PROTEIN: 7.5 g/dL (ref 6.4–8.3)
Total Bilirubin: 0.57 mg/dL (ref 0.20–1.20)

## 2015-04-04 LAB — TSH CHCC: TSH: 0.592 m(IU)/L (ref 0.308–3.960)

## 2015-04-04 MED ORDER — NIVOLUMAB CHEMO INJECTION 100 MG/10ML
3.1000 mg/kg | Freq: Once | INTRAVENOUS | Status: AC
  Administered 2015-04-04: 200 mg via INTRAVENOUS
  Filled 2015-04-04: qty 20

## 2015-04-04 MED ORDER — SODIUM CHLORIDE 0.9 % IV SOLN
Freq: Once | INTRAVENOUS | Status: AC
  Administered 2015-04-04: 15:00:00 via INTRAVENOUS

## 2015-04-04 MED ORDER — HYDROCODONE-ACETAMINOPHEN 5-325 MG PO TABS: 1.0000 | ORAL_TABLET | ORAL | Status: DC | PRN

## 2015-04-04 MED ORDER — ONDANSETRON 8 MG PO TBDP: 8.0000 mg | ORAL_TABLET | Freq: Three times a day (TID) | ORAL | Status: DC | PRN

## 2015-04-04 MED ORDER — SODIUM CHLORIDE 0.9 % IJ SOLN
10.0000 mL | INTRAMUSCULAR | Status: DC | PRN
  Administered 2015-04-04: 10 mL
  Filled 2015-04-04: qty 10

## 2015-04-04 MED ORDER — HEPARIN SOD (PORK) LOCK FLUSH 100 UNIT/ML IV SOLN
500.0000 [IU] | Freq: Once | INTRAVENOUS | Status: AC | PRN
Start: 2015-04-04 — End: 2015-04-04
  Administered 2015-04-04: 500 [IU]
  Filled 2015-04-04: qty 5

## 2015-04-04 NOTE — Progress Notes (Signed)
[facility] [phone]   Fax:(336) [place], [place], [address] [place] [address]  DIAGNOSIS: Metastatic non-small cell lung cancer, squamous cell carcinoma diagnosed in March of 2013.   PRIOR THERAPY:  1. Status post palliative radiotherapy to the left lung mass under the care of Dr. Pablo Ledger completed on 03/06/2012.  2. Systemic chemotherapy with carboplatin for AUC of 6 on day 1 and Abraxane 100 mg/M2 on days 1, 8 and 15 every 3 weeks. Status post 2 cycles. From cycle 3 forward AUC will be decreased to 4.5 given on day 1 and the Abraxane will be decreased to 90 mg per meter squared on days 1, 8 and 15 every 3 weeks, Status post a total of 3 cycles.  3. Systemic chemotherapy with carboplatin for AUC of 5 on day 1 and gemcitabine 1000 mg/m2 given on day 1 and day 8 every 3 weeks,status post 1 cycle. Due to significant neutropenia she will be dosed reduced beginning cycle 2 forward to carboplatin at an AUC of 4 given on day 1 and gemcitabine at 800 mg per meter squared given on days 1 and 8 every 3 weeks. Status post 6 cycles.  4. Status post palliative radiotherapy to the right hilum under the care of Dr. Pablo Ledger completed on 11/26/2013.  CURRENT THERAPY: Immunotherapy with Nivolumab 3 MG/KG every 2 weeks. First dose expected 03/07/2015. Status post 2 cycles.  CHEMOTHERAPY INTENT: Palliative  CURRENT # OF CHEMOTHERAPY CYCLES: 3  CURRENT ANTIEMETICS: Compazine  CURRENT SMOKING STATUS: Former smoker  ORAL CHEMOTHERAPY AND CONSENT: None  CURRENT BISPHOSPHONATES USE: None  PAIN MANAGEMENT: 5/10 right shoulder currently on Norco  NARCOTICS INDUCED CONSTIPATION: None  LIVING WILL AND CODE STATUS: No CODE BLUE   INTERVAL HISTORY: Rubylee Zamarripa 74 y.o. female returns to the clinic today for routine followup visit accompanied by her son. The patient is feeling fine today with no specific  complaints except for mild persistent chronic back pain. She takes Vicodin for pain management on as-needed basis. Requests a refill of this today. She also continues to have a nonproductive cough. She tolerated her second cycle of immunotherapy with Nivolumab relatively well and states that she did better with the second cycle than she with the first. She denied any changes to her baseline shortness of breath, denied skin rash or diarrhea. Using Compazine for nausea, but states that it is not working very well. Request medication that she can dissolve under her tongue. She denied having any significant weight loss or night sweats. She has no chest pain, but has shortness of breath increased with exertion and dry, cough with no hemoptysis. The patient denied having any significant fever or chills, nausea or vomiting.   MEDICAL HISTORY: Past Medical History  Diagnosis Date  . COPD (chronic obstructive pulmonary disease)   . Neutropenia, drug-induced 05/05/2012  . Hyperlipidemia   . Rheumatoid arthritis(714.0)   . Asthma   . Hiatal hernia   . History of chemotherapy   . History of radiation therapy 03/06/2012    left hilum  . History of radiation therapy 05/10/2013-05/31/2013    Left lung/ 33/75'@2'$ .25 per fraction x 15 fractions  . Radiation 11/15/13-11/26/13    Right hilum 30 Gy in 10 fractions  . Non-small cell lung cancer dx'd 08/28/11    left lung    ALLERGIES:  is allergic to shellfish allergy.  MEDICATIONS:  Current Outpatient Prescriptions  Medication Sig Dispense Refill  .  albuterol (PROVENTIL HFA;VENTOLIN HFA) 108 (90 BASE) MCG/ACT inhaler Inhale 2 puffs into the lungs every 4 (four) hours as needed for wheezing or shortness of breath. 1 Inhaler 1  . albuterol (PROVENTIL) (2.5 MG/3ML) 0.083% nebulizer solution Take 2.5 mg by nebulization every 8 (eight) hours.    Marland Kitchen albuterol (VENTOLIN HFA) 108 (90 BASE) MCG/ACT inhaler Inhale 2 puffs into the lungs every 3 (three) hours as needed for  wheezing or shortness of breath. 1 Inhaler 6  . calcium-vitamin D (OSCAL WITH D) 500-200 MG-UNIT per tablet Take 1 tablet by mouth daily.    . folic acid (FOLVITE) 160 MCG tablet Take 400 mcg by mouth daily.    Marland Kitchen HYDROcodone-acetaminophen (NORCO/VICODIN) 5-325 MG per tablet Take 1 tablet by mouth every 4 (four) hours as needed for moderate pain. 30 tablet 0  . ipratropium (ATROVENT) 0.02 % nebulizer solution Take 0.5 mg by nebulization every 8 (eight) hours.    . predniSONE (DELTASONE) 5 MG tablet Take 5 mg by mouth daily.     . prochlorperazine (COMPAZINE) 10 MG tablet Take 1 tablet (10 mg total) by mouth every 6 (six) hours as needed for nausea or vomiting. 30 tablet 0  . simvastatin (ZOCOR) 5 MG tablet Take 5 mg by mouth at bedtime.     No current facility-administered medications for this visit.    SURGICAL HISTORY:  Past Surgical History  Procedure Laterality Date  . Appendex  1962  . Video bronchoscopy  01/28/2012    Procedure: VIDEO BRONCHOSCOPY WITHOUT FLUORO;  Surgeon: Brand Males, MD;  Location: [facility] ENDOSCOPY;  Service: Endoscopy;  Laterality: Bilateral;  . Surgery on right wrist      REVIEW OF SYSTEMS:  Constitutional: positive for fatigue and malaise Eyes: negative Ears, nose, mouth, throat, and face: negative Respiratory: positive for cough and dyspnea on exertion Cardiovascular: negative Gastrointestinal: negative Genitourinary:negative Integument/breast: negative Hematologic/lymphatic: negative Musculoskeletal:negative Neurological: negative Behavioral/Psych: negative Endocrine: negative Allergic/Immunologic: negative   PHYSICAL EXAMINATION: General appearance: alert, cooperative and no distress Head: Normocephalic, without obvious abnormality, atraumatic Neck: no adenopathy, no JVD, supple, symmetrical, trachea midline and thyroid not enlarged, symmetric, no tenderness/mass/nodules Lymph nodes: Cervical, supraclavicular, and axillary nodes normal. Resp: clear  to auscultation bilaterally Back: symmetric, no curvature. ROM normal. No CVA tenderness. Cardio: regular rate and rhythm, S1, S2 normal, no murmur, click, rub or gallop GI: soft, non-tender; bowel sounds normal; no masses,  no organomegaly Extremities: extremities normal, atraumatic, no cyanosis or edema Neurologic: Alert and oriented X 3, normal strength and tone. Normal symmetric reflexes. Normal coordination and gait  ECOG PERFORMANCE STATUS: 1 - Symptomatic but completely ambulatory  There were no vitals taken for this visit.  LABORATORY DATA: Lab Results  Component Value Date   WBC 5.2 04/04/2015   HGB 11.4* 04/04/2015   HCT 33.9* 04/04/2015   MCV 80.7 04/04/2015   PLT 298 04/04/2015      Chemistry      Component Value Date/Time   NA 142 03/21/2015 1359   NA 135* 06/25/2014 0219   K 3.7 03/21/2015 1359   K 3.9 06/25/2014 0219   CL 97 06/25/2014 0219   CL 101 01/12/2013 0810   CO2 27 03/21/2015 1359   CO2 24 06/25/2014 0219   BUN 8.8 03/21/2015 1359   BUN 11 06/25/2014 0219   CREATININE 0.8 03/21/2015 1359   CREATININE 0.83 06/25/2014 0219      Component Value Date/Time   CALCIUM 8.9 03/21/2015 1359   CALCIUM 8.8 06/25/2014 0219  ALKPHOS 75 03/21/2015 1359   ALKPHOS 75 06/24/2014 2111   AST 14 03/21/2015 1359   AST 14 06/24/2014 2111   ALT 6 03/21/2015 1359   ALT 7 06/24/2014 2111   BILITOT 0.43 03/21/2015 1359   BILITOT 0.4 06/24/2014 2111       RADIOGRAPHIC STUDIES: No results found. ASSESSMENT AND PLAN: This is a very pleasant 74 year old African American female with metastatic non-small cell lung cancer, squamous cell carcinoma status post several chemotherapy regimen and and completed a course of palliative radiotherapy to the right hilum in February 2015.   Her recent CT scan of the chest, abdomen and pelvis showed evidence for disease progression with enlarging right hilar mass and progressive mediastinal lymphadenopathy as well as slight  progression of bilateral supraclavicular lymph nodes.  She is currently receiving treatment with Nivolumab 3 MG/KG every 2 weeks. Status post 2 cycles. She is overall tolerating her treatment well. Patient was discussed with and also seen by Dr. Julien Nordmann. She will proceed with cycle #3 of her immunotherapy with  Nivolumab today as scheduled. She'll follow-up in 2 weeks prior to cycle #4. A prescription for Zofran ODT was sent to her pharmacy. I have also refilled her hydrocodone for her today.  She was advised to call immediately if she has any concerning symptoms in the interval.  The patient voices understanding of current disease status and treatment options and is in agreement with the current care plan.  All questions were answered. The patient knows to call the clinic with any problems, questions or concerns. We can certainly see the patient much sooner if necessary.  Mikey Bussing, DNP, AGPCNP-BC, AOCNP  ADDENDUM: Hematology/Oncology Attending: I had a face to face encounter with the patient. I recommended her care plan. This is a very pleasant 74 years old African-American female with metastatic non-small cell lung cancer, squamous cell carcinoma is currently undergoing immunotherapy with Nivolumab status post 2 cycles. The patient is tolerating her treatment with Nivolumab fairly well with no significant adverse effects except for mild fatigue. I recommended for her to proceed with cycle #3 today as a scheduled. She will come back for follow-up visit in 2 weeks for reevaluation before starting cycle #4. For the back pain, she was given a refill of Percocet. The patient was advised to call immediately if she has any concerning symptoms in the interval.  Disclaimer: This note was dictated with voice recognition software. Similar sounding words can inadvertently be transcribed and may not be corrected upon review. Eilleen Kempf., MD 04/05/2015

## 2015-04-04 NOTE — Telephone Encounter (Signed)
per pof to sch pt appt-sent MW emil to move trmt to coordinate w/MD-pt aware-gave avs

## 2015-04-04 NOTE — Patient Instructions (Signed)
[facility] Discharge Instructions for Patients Receiving Chemotherapy  Today you received the following chemotherapy agents:  Nivolumab.  To help prevent nausea and vomiting after your treatment, we encourage you to take your nausea medication as directed.   If you develop nausea and vomiting that is not controlled by your nausea medication, call the clinic.   BELOW ARE SYMPTOMS THAT SHOULD BE REPORTED IMMEDIATELY:  *FEVER GREATER THAN 100.5 F  *CHILLS WITH OR WITHOUT FEVER  NAUSEA AND VOMITING THAT IS NOT CONTROLLED WITH YOUR NAUSEA MEDICATION  *UNUSUAL SHORTNESS OF BREATH  *UNUSUAL BRUISING OR BLEEDING  TENDERNESS IN MOUTH AND THROAT WITH OR WITHOUT PRESENCE OF ULCERS  *URINARY PROBLEMS  *BOWEL PROBLEMS  UNUSUAL RASH Items with * indicate a potential emergency and should be followed up as soon as possible.  Feel free to call the clinic you have any questions or concerns. The clinic phone number is [phone].  Please show the CHEMO ALERT CARD at check-in to the Emergency Department and triage nurse.   

## 2015-04-18 ENCOUNTER — Ambulatory Visit (HOSPITAL_BASED_OUTPATIENT_CLINIC_OR_DEPARTMENT_OTHER): Payer: Medicare Other

## 2015-04-18 ENCOUNTER — Encounter: Payer: Self-pay | Admitting: Physician Assistant

## 2015-04-18 ENCOUNTER — Telehealth: Payer: Self-pay | Admitting: Internal Medicine

## 2015-04-18 ENCOUNTER — Ambulatory Visit (HOSPITAL_BASED_OUTPATIENT_CLINIC_OR_DEPARTMENT_OTHER): Payer: Medicare Other | Admitting: Physician Assistant

## 2015-04-18 ENCOUNTER — Other Ambulatory Visit (HOSPITAL_BASED_OUTPATIENT_CLINIC_OR_DEPARTMENT_OTHER): Payer: Medicare Other

## 2015-04-18 VITALS — BP 164/77 | HR 117 | Temp 98.7°F | Resp 18 | Ht 65.0 in | Wt 132.9 lb

## 2015-04-18 DIAGNOSIS — C3412 Malignant neoplasm of upper lobe, left bronchus or lung: Secondary | ICD-10-CM

## 2015-04-18 DIAGNOSIS — R05 Cough: Secondary | ICD-10-CM | POA: Diagnosis not present

## 2015-04-18 DIAGNOSIS — R0609 Other forms of dyspnea: Secondary | ICD-10-CM | POA: Diagnosis not present

## 2015-04-18 DIAGNOSIS — M545 Low back pain: Secondary | ICD-10-CM | POA: Diagnosis not present

## 2015-04-18 DIAGNOSIS — C3411 Malignant neoplasm of upper lobe, right bronchus or lung: Secondary | ICD-10-CM

## 2015-04-18 DIAGNOSIS — Z5112 Encounter for antineoplastic immunotherapy: Secondary | ICD-10-CM

## 2015-04-18 DIAGNOSIS — G8929 Other chronic pain: Secondary | ICD-10-CM

## 2015-04-18 DIAGNOSIS — C3490 Malignant neoplasm of unspecified part of unspecified bronchus or lung: Secondary | ICD-10-CM

## 2015-04-18 LAB — CBC WITH DIFFERENTIAL/PLATELET
BASO%: 0.4 % (ref 0.0–2.0)
Basophils Absolute: 0 10*3/uL (ref 0.0–0.1)
EOS%: 9 % — ABNORMAL HIGH (ref 0.0–7.0)
Eosinophils Absolute: 0.6 10*3/uL — ABNORMAL HIGH (ref 0.0–0.5)
HCT: 34.8 % (ref 34.8–46.6)
HGB: 11.3 g/dL — ABNORMAL LOW (ref 11.6–15.9)
LYMPH#: 1.1 10*3/uL (ref 0.9–3.3)
LYMPH%: 17.6 % (ref 14.0–49.7)
MCH: 26.5 pg (ref 25.1–34.0)
MCHC: 32.5 g/dL (ref 31.5–36.0)
MCV: 81.6 fL (ref 79.5–101.0)
MONO#: 0.8 10*3/uL (ref 0.1–0.9)
MONO%: 13.2 % (ref 0.0–14.0)
NEUT#: 3.8 10*3/uL (ref 1.5–6.5)
NEUT%: 59.8 % (ref 38.4–76.8)
Platelets: 321 10*3/uL (ref 145–400)
RBC: 4.26 10*6/uL (ref 3.70–5.45)
RDW: 15.3 % — ABNORMAL HIGH (ref 11.2–14.5)
WBC: 6.4 10*3/uL (ref 3.9–10.3)

## 2015-04-18 LAB — COMPREHENSIVE METABOLIC PANEL (CC13)
ALK PHOS: 70 U/L (ref 40–150)
ALT: 10 U/L (ref 0–55)
AST: 19 U/L (ref 5–34)
Albumin: 3.2 g/dL — ABNORMAL LOW (ref 3.5–5.0)
Anion Gap: 8 mEq/L (ref 3–11)
BILIRUBIN TOTAL: 0.52 mg/dL (ref 0.20–1.20)
BUN: 9.8 mg/dL (ref 7.0–26.0)
CO2: 27 mEq/L (ref 22–29)
Calcium: 9.2 mg/dL (ref 8.4–10.4)
Chloride: 105 mEq/L (ref 98–109)
Creatinine: 0.9 mg/dL (ref 0.6–1.1)
EGFR: 68 mL/min/{1.73_m2} — ABNORMAL LOW (ref >90)
Glucose: 140 mg/dl (ref 70–140)
Potassium: 3.6 mEq/L (ref 3.5–5.1)
SODIUM: 140 meq/L (ref 136–145)
TOTAL PROTEIN: 7.2 g/dL (ref 6.4–8.3)

## 2015-04-18 MED ORDER — SODIUM CHLORIDE 0.9 % IJ SOLN
10.0000 mL | INTRAMUSCULAR | Status: DC | PRN
  Administered 2015-04-18: 10 mL
  Filled 2015-04-18: qty 10

## 2015-04-18 MED ORDER — SODIUM CHLORIDE 0.9 % IV SOLN
Freq: Once | INTRAVENOUS | Status: AC
  Administered 2015-04-18: 16:00:00 via INTRAVENOUS

## 2015-04-18 MED ORDER — HEPARIN SOD (PORK) LOCK FLUSH 100 UNIT/ML IV SOLN
500.0000 [IU] | Freq: Once | INTRAVENOUS | Status: AC | PRN
  Administered 2015-04-18: 500 [IU]
  Filled 2015-04-18: qty 5

## 2015-04-18 MED ORDER — NIVOLUMAB CHEMO INJECTION 100 MG/10ML
3.1000 mg/kg | Freq: Once | INTRAVENOUS | Status: AC
  Administered 2015-04-18: 200 mg via INTRAVENOUS
  Filled 2015-04-18: qty 20

## 2015-04-18 NOTE — Patient Instructions (Signed)
[facility] Discharge Instructions for Patients Receiving Chemotherapy  Today you received the following chemotherapy agent:  Nivolumab.  To help prevent nausea and vomiting after your treatment, we encourage you to take your nausea medication as prescribed.   If you develop nausea and vomiting that is not controlled by your nausea medication, call the clinic.   BELOW ARE SYMPTOMS THAT SHOULD BE REPORTED IMMEDIATELY:  *FEVER GREATER THAN 100.5 F  *CHILLS WITH OR WITHOUT FEVER  NAUSEA AND VOMITING THAT IS NOT CONTROLLED WITH YOUR NAUSEA MEDICATION  *UNUSUAL SHORTNESS OF BREATH  *UNUSUAL BRUISING OR BLEEDING  TENDERNESS IN MOUTH AND THROAT WITH OR WITHOUT PRESENCE OF ULCERS  *URINARY PROBLEMS  *BOWEL PROBLEMS  UNUSUAL RASH Items with * indicate a potential emergency and should be followed up as soon as possible.  Feel free to call the clinic you have any questions or concerns. The clinic phone number is [phone].  Please show the CHEMO ALERT CARD at check-in to the Emergency Department and triage nurse.   

## 2015-04-18 NOTE — Progress Notes (Addendum)
[facility] [phone]   Fax:(336) [place], [address], [address] [place] [address]  DIAGNOSIS: Metastatic non-small cell lung cancer, squamous cell carcinoma diagnosed in March of 2013.   PRIOR THERAPY:  1. Status post palliative radiotherapy to the left lung mass under the care of Dr. Pablo Ledger completed on 03/06/2012.  2. Systemic chemotherapy with carboplatin for AUC of 6 on day 1 and Abraxane 100 mg/M2 on days 1, 8 and 15 every 3 weeks. Status post 2 cycles. From cycle 3 forward AUC will be decreased to 4.5 given on day 1 and the Abraxane will be decreased to 90 mg per meter squared on days 1, 8 and 15 every 3 weeks, Status post a total of 3 cycles.  3. Systemic chemotherapy with carboplatin for AUC of 5 on day 1 and gemcitabine 1000 mg/m2 given on day 1 and day 8 every 3 weeks,status post 1 cycle. Due to significant neutropenia she will be dosed reduced beginning cycle 2 forward to carboplatin at an AUC of 4 given on day 1 and gemcitabine at 800 mg per meter squared given on days 1 and 8 every 3 weeks. Status post 6 cycles.  4. Status post palliative radiotherapy to the right hilum under the care of Dr. Pablo Ledger completed on 11/26/2013.  CURRENT THERAPY: Immunotherapy with Nivolumab 3 MG/KG every 2 weeks. First dose given 03/07/2015. Status post 3 cycles.  CHEMOTHERAPY INTENT: Palliative  CURRENT # OF CHEMOTHERAPY CYCLES: 4  CURRENT ANTIEMETICS: Compazine  CURRENT SMOKING STATUS: Former smoker  ORAL CHEMOTHERAPY AND CONSENT: None  CURRENT BISPHOSPHONATES USE: None  PAIN MANAGEMENT: 5/10 right shoulder currently on Norco  NARCOTICS INDUCED CONSTIPATION: None  LIVING WILL AND CODE STATUS: No CODE BLUE   INTERVAL HISTORY: Destiny Harrison 73 y.o. female returns to the clinic today for routine followup visit accompanied by her son. The patient is feeling fine today with no specific complaints  except for mild persistent chronic back pain. She takes Vicodin for pain management on as-needed basis. She reports some increased wheezing with the increased relative humidity. She is doing her nebulizer treatments 3 times daily as well as using her inhalers as needed. She also continues to have a nonproductive cough. Overall she is tolerating her treatment with immunotherapy with Nivolumab relatively well.  She denied any changes to her baseline shortness of breath, denied skin rash or diarrhea. She denied having any significant weight loss or night sweats. She has no chest pain, but has shortness of breath increased with exertion and dry, cough with no hemoptysis. The patient denied having any significant fever or chills, nausea or vomiting.   MEDICAL HISTORY: Past Medical History  Diagnosis Date  . COPD (chronic obstructive pulmonary disease)   . Neutropenia, drug-induced 05/05/2012  . Hyperlipidemia   . Rheumatoid arthritis(714.0)   . Asthma   . Hiatal hernia   . History of chemotherapy   . History of radiation therapy 03/06/2012    left hilum  . History of radiation therapy 05/10/2013-05/31/2013    Left lung/ 33/75'@2'$ .25 per fraction x 15 fractions  . Radiation 11/15/13-11/26/13    Right hilum 30 Gy in 10 fractions  . Non-small cell lung cancer dx'd 08/28/11    left lung    ALLERGIES:  is allergic to shellfish allergy.  MEDICATIONS:  Current Outpatient Prescriptions  Medication Sig Dispense Refill  . albuterol (PROVENTIL HFA;VENTOLIN HFA) 108 (90 BASE) MCG/ACT inhaler Inhale 2 puffs  into the lungs every 4 (four) hours as needed for wheezing or shortness of breath. 1 Inhaler 1  . albuterol (PROVENTIL) (2.5 MG/3ML) 0.083% nebulizer solution Take 2.5 mg by nebulization every 8 (eight) hours.    Marland Kitchen albuterol (VENTOLIN HFA) 108 (90 BASE) MCG/ACT inhaler Inhale 2 puffs into the lungs every 3 (three) hours as needed for wheezing or shortness of breath. 1 Inhaler 6  . calcium-vitamin D (OSCAL WITH  D) 500-200 MG-UNIT per tablet Take 1 tablet by mouth daily.    . folic acid (FOLVITE) 102 MCG tablet Take 400 mcg by mouth daily.    Marland Kitchen HYDROcodone-acetaminophen (NORCO/VICODIN) 5-325 MG per tablet Take 1 tablet by mouth every 4 (four) hours as needed for moderate pain. 30 tablet 0  . ipratropium (ATROVENT) 0.02 % nebulizer solution Take 0.5 mg by nebulization every 8 (eight) hours.    . ondansetron (ZOFRAN ODT) 8 MG disintegrating tablet Take 1 tablet (8 mg total) by mouth every 8 (eight) hours as needed for nausea or vomiting. 20 tablet 0  . predniSONE (DELTASONE) 5 MG tablet Take 5 mg by mouth daily.     . prochlorperazine (COMPAZINE) 10 MG tablet Take 1 tablet (10 mg total) by mouth every 6 (six) hours as needed for nausea or vomiting. 30 tablet 0  . simvastatin (ZOCOR) 5 MG tablet Take 5 mg by mouth at bedtime.     No current facility-administered medications for this visit.   Facility-Administered Medications Ordered in Other Visits  Medication Dose Route Frequency Provider Last Rate Last Dose  . heparin lock flush 100 unit/mL  500 Units Intracatheter Once PRN Curt Bears, MD      . nivolumab (OPDIVO) 200 mg in sodium chloride 0.9 % 100 mL chemo infusion  3.1 mg/kg (Treatment Plan Actual) Intravenous Once Curt Bears, MD 120 mL/hr at 04/18/15 1619 200 mg at 04/18/15 1619  . sodium chloride 0.9 % injection 10 mL  10 mL Intracatheter PRN Curt Bears, MD        SURGICAL HISTORY:  Past Surgical History  Procedure Laterality Date  . Appendex  1962  . Video bronchoscopy  01/28/2012    Procedure: VIDEO BRONCHOSCOPY WITHOUT FLUORO;  Surgeon: Brand Males, MD;  Location: [facility] ENDOSCOPY;  Service: Endoscopy;  Laterality: Bilateral;  . Surgery on right wrist      REVIEW OF SYSTEMS:  Constitutional: positive for fatigue and malaise Eyes: negative Ears, nose, mouth, throat, and face: negative Respiratory: positive for cough, dyspnea on exertion and wheezing Cardiovascular:  negative Gastrointestinal: negative Genitourinary:negative Integument/breast: negative Hematologic/lymphatic: negative Musculoskeletal:negative Neurological: negative Behavioral/Psych: negative Endocrine: negative Allergic/Immunologic: negative   PHYSICAL EXAMINATION: General appearance: alert, cooperative and no distress Head: Normocephalic, without obvious abnormality, atraumatic Neck: no adenopathy, no JVD, supple, symmetrical, trachea midline and thyroid not enlarged, symmetric, no tenderness/mass/nodules Lymph nodes: Cervical, supraclavicular, and axillary nodes normal. Resp: wheezes bilaterally Back: symmetric, no curvature. ROM normal. No CVA tenderness. Cardio: regular rate and rhythm, S1, S2 normal, no murmur, click, rub or gallop GI: soft, non-tender; bowel sounds normal; no masses,  no organomegaly Extremities: extremities normal, atraumatic, no cyanosis or edema Neurologic: Alert and oriented X 3, normal strength and tone. Normal symmetric reflexes. Normal coordination and gait  ECOG PERFORMANCE STATUS: 1 - Symptomatic but completely ambulatory  Blood pressure 164/77, pulse 117, temperature 98.7 F (37.1 C), temperature source Oral, resp. rate 18, height '5\' 5"'$  (1.651 m), weight 132 lb 14.4 oz (60.283 kg), SpO2 100 %.  LABORATORY DATA: Lab Results  Component  Value Date   WBC 6.4 04/18/2015   HGB 11.3* 04/18/2015   HCT 34.8 04/18/2015   MCV 81.6 04/18/2015   PLT 321 04/18/2015      Chemistry      Component Value Date/Time   NA 140 04/18/2015 1400   NA 135* 06/25/2014 0219   K 3.6 04/18/2015 1400   K 3.9 06/25/2014 0219   CL 97 06/25/2014 0219   CL 101 01/12/2013 0810   CO2 27 04/18/2015 1400   CO2 24 06/25/2014 0219   BUN 9.8 04/18/2015 1400   BUN 11 06/25/2014 0219   CREATININE 0.9 04/18/2015 1400   CREATININE 0.83 06/25/2014 0219      Component Value Date/Time   CALCIUM 9.2 04/18/2015 1400   CALCIUM 8.8 06/25/2014 0219   ALKPHOS 70 04/18/2015 1400    ALKPHOS 75 06/24/2014 2111   AST 19 04/18/2015 1400   AST 14 06/24/2014 2111   ALT 10 04/18/2015 1400   ALT 7 06/24/2014 2111   BILITOT 0.52 04/18/2015 1400   BILITOT 0.4 06/24/2014 2111       RADIOGRAPHIC STUDIES: No results found. ASSESSMENT AND PLAN: This is a very pleasant 74 year old African American female with metastatic non-small cell lung cancer, squamous cell carcinoma status post several chemotherapy regimen and and completed a course of palliative radiotherapy to the right hilum in February 2015.   Her recent CT scan of the chest, abdomen and pelvis showed evidence for disease progression with enlarging right hilar mass and progressive mediastinal lymphadenopathy as well as slight progression of bilateral supraclavicular lymph nodes.  She is currently receiving treatment with Nivolumab 3 MG/KG every 2 weeks. Status post 3 cycles. She is overall tolerating her treatment well. Patient was discussed with and also seen by Dr. Julien Nordmann. She will proceed with cycle #4 of her immunotherapy with  Nivolumab today as scheduled. She'll follow-up in 2 weeks prior to cycle #5 with a restaging CT scan of the chest with contrast to reevaluate her disease.   She was advised to call immediately if she has any concerning symptoms in the interval.  The patient voices understanding of current disease status and treatment options and is in agreement with the current care plan.  All questions were answered. The patient knows to call the clinic with any problems, questions or concerns. We can certainly see the patient much sooner if necessary.  Carlton Adam PA-C   ADDENDUM: Hematology/Oncology Attending: I had a face to face encounter with the patient. I recommended her care plan. This is a very pleasant 74 years old African-American female with metastatic non-small cell lung cancer, squamous cell carcinoma was currently undergoing treatment with immunotherapy with Nivolumab status post 3  cycles. The patient is tolerating her treatment fairly well with no significant adverse effects. I recommended for her to proceed with cycle #4 today as scheduled. The patient would come back for follow-up visit in 2 weeks for reevaluation after repeating CT scan of the chest, Abdomen and pelvis for restaging of her disease. She was advised to call immediately if she has any concerning symptoms in the interval.  Disclaimer: This note was dictated with voice recognition software. Similar sounding words can inadvertently be transcribed and may be missed upon review. Eilleen Kempf., MD 04/22/2015

## 2015-04-18 NOTE — Telephone Encounter (Signed)
Gave and printed appt sched and avs for pt for July  °

## 2015-04-20 ENCOUNTER — Other Ambulatory Visit: Payer: Self-pay | Admitting: Internal Medicine

## 2015-04-21 ENCOUNTER — Emergency Department (HOSPITAL_COMMUNITY): Payer: Medicare Other

## 2015-04-21 ENCOUNTER — Other Ambulatory Visit: Payer: Self-pay

## 2015-04-21 ENCOUNTER — Inpatient Hospital Stay (HOSPITAL_COMMUNITY)
Admission: EM | Admit: 2015-04-21 | Discharge: 2015-04-23 | DRG: 190 | Disposition: A | Discharge status: Home / Self Care | Payer: Medicare Other | Attending: Internal Medicine | Admitting: Internal Medicine

## 2015-04-21 ENCOUNTER — Encounter (HOSPITAL_COMMUNITY): Payer: Self-pay

## 2015-04-21 DIAGNOSIS — I248 Other forms of acute ischemic heart disease: Secondary | ICD-10-CM | POA: Diagnosis present

## 2015-04-21 DIAGNOSIS — D6481 Anemia due to antineoplastic chemotherapy: Secondary | ICD-10-CM | POA: Diagnosis present

## 2015-04-21 DIAGNOSIS — J9601 Acute respiratory failure with hypoxia: Secondary | ICD-10-CM | POA: Insufficient documentation

## 2015-04-21 DIAGNOSIS — Z79899 Other long term (current) drug therapy: Secondary | ICD-10-CM

## 2015-04-21 DIAGNOSIS — C3411 Malignant neoplasm of upper lobe, right bronchus or lung: Secondary | ICD-10-CM | POA: Diagnosis present

## 2015-04-21 DIAGNOSIS — Z87891 Personal history of nicotine dependence: Secondary | ICD-10-CM | POA: Diagnosis not present

## 2015-04-21 DIAGNOSIS — J45909 Unspecified asthma, uncomplicated: Secondary | ICD-10-CM | POA: Diagnosis present

## 2015-04-21 DIAGNOSIS — Z6821 Body mass index (BMI) 21.0-21.9, adult: Secondary | ICD-10-CM

## 2015-04-21 DIAGNOSIS — Z833 Family history of diabetes mellitus: Secondary | ICD-10-CM

## 2015-04-21 DIAGNOSIS — E440 Moderate protein-calorie malnutrition: Secondary | ICD-10-CM | POA: Diagnosis present

## 2015-04-21 DIAGNOSIS — Z7952 Long term (current) use of systemic steroids: Secondary | ICD-10-CM

## 2015-04-21 DIAGNOSIS — E785 Hyperlipidemia, unspecified: Secondary | ICD-10-CM | POA: Diagnosis not present

## 2015-04-21 DIAGNOSIS — J441 Chronic obstructive pulmonary disease with (acute) exacerbation: Secondary | ICD-10-CM | POA: Diagnosis not present

## 2015-04-21 DIAGNOSIS — M069 Rheumatoid arthritis, unspecified: Secondary | ICD-10-CM | POA: Diagnosis present

## 2015-04-21 DIAGNOSIS — R590 Localized enlarged lymph nodes: Secondary | ICD-10-CM | POA: Diagnosis present

## 2015-04-21 DIAGNOSIS — Z803 Family history of malignant neoplasm of breast: Secondary | ICD-10-CM | POA: Diagnosis not present

## 2015-04-21 DIAGNOSIS — Z79891 Long term (current) use of opiate analgesic: Secondary | ICD-10-CM | POA: Diagnosis not present

## 2015-04-21 DIAGNOSIS — Z91013 Allergy to seafood: Secondary | ICD-10-CM | POA: Diagnosis not present

## 2015-04-21 DIAGNOSIS — Z9221 Personal history of antineoplastic chemotherapy: Secondary | ICD-10-CM

## 2015-04-21 DIAGNOSIS — R0603 Acute respiratory distress: Secondary | ICD-10-CM | POA: Diagnosis present

## 2015-04-21 DIAGNOSIS — J449 Chronic obstructive pulmonary disease, unspecified: Secondary | ICD-10-CM | POA: Diagnosis not present

## 2015-04-21 DIAGNOSIS — Z923 Personal history of irradiation: Secondary | ICD-10-CM

## 2015-04-21 DIAGNOSIS — R05 Cough: Secondary | ICD-10-CM | POA: Diagnosis not present

## 2015-04-21 DIAGNOSIS — C3490 Malignant neoplasm of unspecified part of unspecified bronchus or lung: Secondary | ICD-10-CM | POA: Diagnosis present

## 2015-04-21 DIAGNOSIS — F17210 Nicotine dependence, cigarettes, uncomplicated: Secondary | ICD-10-CM | POA: Diagnosis not present

## 2015-04-21 DIAGNOSIS — R053 Chronic cough: Secondary | ICD-10-CM | POA: Diagnosis present

## 2015-04-21 DIAGNOSIS — R0602 Shortness of breath: Secondary | ICD-10-CM

## 2015-04-21 LAB — CBC
HCT: 36.1 % (ref 36.0–46.0)
Hemoglobin: 11.9 g/dL — ABNORMAL LOW (ref 12.0–15.0)
MCH: 27 pg (ref 26.0–34.0)
MCHC: 33 g/dL (ref 30.0–36.0)
MCV: 81.9 fL (ref 78.0–100.0)
Platelets: 334 10*3/uL (ref 150–400)
RBC: 4.41 MIL/uL (ref 3.87–5.11)
RDW: 14.5 % (ref 11.5–15.5)
WBC: 6.7 10*3/uL (ref 4.0–10.5)

## 2015-04-21 LAB — I-STAT TROPONIN, ED: Troponin i, poc: 0.14 ng/mL (ref 0.00–0.08)

## 2015-04-21 MED ORDER — IPRATROPIUM-ALBUTEROL 0.5-2.5 (3) MG/3ML IN SOLN
3.0000 mL | Freq: Once | RESPIRATORY_TRACT | Status: AC
  Administered 2015-04-21: 3 mL via RESPIRATORY_TRACT
  Filled 2015-04-21: qty 3

## 2015-04-21 MED ORDER — ALBUTEROL (5 MG/ML) CONTINUOUS INHALATION SOLN
10.0000 mg/h | INHALATION_SOLUTION | RESPIRATORY_TRACT | Status: DC
  Administered 2015-04-21: 10 mg/h via RESPIRATORY_TRACT
  Filled 2015-04-21: qty 20

## 2015-04-21 MED ORDER — METHYLPREDNISOLONE SODIUM SUCC 125 MG IJ SOLR
125.0000 mg | Freq: Once | INTRAMUSCULAR | Status: AC
Start: 2015-04-21 — End: 2015-04-21
  Administered 2015-04-21: 125 mg via INTRAVENOUS
  Filled 2015-04-21: qty 2

## 2015-04-21 NOTE — ED Notes (Signed)
MD at bedside. 

## 2015-04-21 NOTE — Patient Instructions (Signed)
Continue nebulizer treatments and inhalers as prescribed follow-up in 2 weeks with restaging CT scan of your chest to reevaluate your disease, prior to your next scheduled cycle of immunotherapy

## 2015-04-21 NOTE — ED Notes (Signed)
Pt presents with family with c/o shortness of breath. Family reported that she started experiencing these symptoms earlier today. Pt has lung CA, recent chemo on Tuesday of this week. Pt is significantly short of breath at this time, diminished in bilateral fields.

## 2015-04-21 NOTE — ED Provider Notes (Signed)
CSN: [id number]     Arrival date & time 04/21/15  2254 History   First MD Initiated Contact with Patient 04/21/15 2309     Chief Complaint  Patient presents with  . Shortness of Breath     (Consider location/radiation/quality/duration/timing/severity/associated sxs/prior Treatment) HPI Patient with history of COPD and non-small cell lung cancer currently undergoing immunotherapy presents with 2 days of worsening shortness of breath. Admits to cough productive of yellow sputum. No fever or chills. Patient denies any pain specifically chest pain, abdominal pain, lower extremity pain. No lower extremity swelling. Past Medical History  Diagnosis Date  . COPD (chronic obstructive pulmonary disease)   . Neutropenia, drug-induced 05/05/2012  . Hyperlipidemia   . Rheumatoid arthritis(714.0)   . Asthma   . Hiatal hernia   . History of chemotherapy   . History of radiation therapy 03/06/2012    left hilum  . History of radiation therapy 05/10/2013-05/31/2013    Left lung/ 33/75'@2'$ .25 per fraction x 15 fractions  . Radiation 11/15/13-11/26/13    Right hilum 30 Gy in 10 fractions  . Non-small cell lung cancer dx'd 08/28/11    left lung   Past Surgical History  Procedure Laterality Date  . Appendex  1962  . Video bronchoscopy  01/28/2012    Procedure: VIDEO BRONCHOSCOPY WITHOUT FLUORO;  Surgeon: Brand Males, MD;  Location: [facility] ENDOSCOPY;  Service: Endoscopy;  Laterality: Bilateral;  . Surgery on right wrist     Family History  Problem Relation Age of Onset  . Diabetes Mother     insulin dependent  . Breast cancer Mother    History  Substance Use Topics  . Smoking status: Former Smoker -- 0.50 packs/day for 40 years    Types: Cigarettes    Quit date: 10/29/2009  . Smokeless tobacco: Never Used  . Alcohol Use: No   OB History    No data available     Review of Systems  Constitutional: Negative for fever and chills.  Respiratory: Positive for cough, shortness of breath and wheezing.    Cardiovascular: Negative for chest pain, palpitations and leg swelling.  Gastrointestinal: Negative for nausea, vomiting and abdominal pain.  Musculoskeletal: Negative for back pain, neck pain and neck stiffness.  Skin: Negative for rash and wound.  Neurological: Negative for dizziness, weakness, light-headedness, numbness and headaches.  All other systems reviewed and are negative.     Allergies  Shellfish allergy  Home Medications   Prior to Admission medications   Medication Sig Start Date End Date Taking? Authorizing Provider  albuterol (PROVENTIL) (2.5 MG/3ML) 0.083% nebulizer solution Take 2.5 mg by nebulization every 8 (eight) hours.   Yes Historical Provider, MD  albuterol (VENTOLIN HFA) 108 (90 BASE) MCG/ACT inhaler Inhale 2 puffs into the lungs every 3 (three) hours as needed for wheezing or shortness of breath. 02/22/14  Yes Brand Males, MD  calcium-vitamin D (OSCAL WITH D) 500-200 MG-UNIT per tablet Take 1 tablet by mouth daily.   Yes Historical Provider, MD  folic acid (FOLVITE) 329 MCG tablet Take 400 mcg by mouth daily.   Yes Historical Provider, MD  HYDROcodone-acetaminophen (NORCO/VICODIN) 5-325 MG per tablet Take 1 tablet by mouth every 4 (four) hours as needed for moderate pain. 04/04/15  Yes Maryanna Shape, NP  ipratropium (ATROVENT) 0.02 % nebulizer solution Take 0.5 mg by nebulization every 8 (eight) hours.   Yes Historical Provider, MD  ondansetron (ZOFRAN ODT) 8 MG disintegrating tablet Take 1 tablet (8 mg total) by mouth every 8 (eight)  hours as needed for nausea or vomiting. 04/04/15  Yes Maryanna Shape, NP  predniSONE (DELTASONE) 5 MG tablet Take 5 mg by mouth daily.    Yes Historical Provider, MD  prochlorperazine (COMPAZINE) 10 MG tablet Take 1 tablet (10 mg total) by mouth every 6 (six) hours as needed for nausea or vomiting. 03/21/15  Yes Adrena E Johnson, PA-C  simvastatin (ZOCOR) 5 MG tablet Take 5 mg by mouth at bedtime.   Yes Historical Provider, MD   PROAIR HFA 108 (90 BASE) MCG/ACT inhaler INHALE 2 PUFFS BY MOUTH EVERY 4 HOURS AS NEEDED FOR WHEEZING OR SHORTNESS OF BREATH 04/21/15   Brand Males, MD   BP 115/68 mmHg  Pulse 118  Temp(Src) 98.4 F (36.9 C) (Oral)  Resp 18  Ht '5\' 5"'$  (1.651 m)  Wt 128 lb 1.4 oz (58.1 kg)  BMI 21.31 kg/m2  SpO2 100% Physical Exam  Constitutional: She is oriented to person, place, and time. She appears well-developed and well-nourished. She appears distressed.  HENT:  Head: Normocephalic and atraumatic.  Mouth/Throat: Oropharynx is clear and moist.  Eyes: EOM are normal. Pupils are equal, round, and reactive to light.  Neck: Normal range of motion. Neck supple.  Cardiovascular: Regular rhythm.   Tachycardia  Pulmonary/Chest: She is in respiratory distress. She has wheezes. She has no rales. She exhibits no tenderness.  Increased work of breathing. Wheezing in all lung fields.  Abdominal: Soft. Bowel sounds are normal. She exhibits no distension and no mass. There is no tenderness. There is no rebound and no guarding.  Musculoskeletal: Normal range of motion. She exhibits no edema or tenderness.   No calf swelling or tenderness.  Neurological: She is alert and oriented to person, place, and time.  Skin: Skin is warm and dry. No rash noted. No erythema.  Psychiatric: She has a normal mood and affect. Her behavior is normal.  Nursing note and vitals reviewed.   ED Course  Procedures (including critical care time) Labs Review Labs Reviewed  BASIC METABOLIC PANEL - Abnormal; Notable for the following:    Glucose, Bld 124 (*)    All other components within normal limits  CBC - Abnormal; Notable for the following:    Hemoglobin 11.9 (*)    All other components within normal limits  BRAIN NATRIURETIC PEPTIDE - Abnormal; Notable for the following:    B Natriuretic Peptide 116.7 (*)    All other components within normal limits  TROPONIN I - Abnormal; Notable for the following:    Troponin I 0.12  (*)    All other components within normal limits  I-STAT TROPOININ, ED - Abnormal; Notable for the following:    Troponin i, poc 0.14 (*)    All other components within normal limits  MRSA PCR SCREENING  TROPONIN I  TROPONIN I    Imaging Review Dg Chest Port 1 View  04/21/2015   CLINICAL DATA:  Acute onset of severe shortness of breath. Known lung cancer. Initial encounter.  EXAM: PORTABLE CHEST - 1 VIEW  COMPARISON:  CT of the chest performed 02/09/2015, and chest radiograph performed 06/24/2014  FINDINGS: The patient's right hilar mass is again noted, concerning for malignancy. Known mediastinal lymphadenopathy is not well characterized on radiograph, except at the right paratracheal region. No superimposed focal airspace consolidation is seen. No pleural effusion or pneumothorax is identified.  The cardiomediastinal silhouette remains normal in size. A right-sided chest port is noted ending about the mid SVC. No acute osseous abnormalities identified.  IMPRESSION: Right hilar mass remains concerning for malignancy. Known mediastinal lymphadenopathy is not well characterized on radiograph, except for the large mass in the right paratracheal region. No superimposed focal airspace consolidation seen.   Electronically Signed   By: Garald Balding M.D.   On: 04/21/2015 23:31     EKG Interpretation   Date/Time:  Friday April 21 2015 23:05:37 EDT Ventricular Rate:  133 PR Interval:  104 QRS Duration: 76 QT Interval:  309 QTC Calculation: 460 R Axis:   86 Text Interpretation:  Sinus tachycardia Borderline right axis deviation  Consider anterior infarct Nonspecific T abnormalities, lateral leads  Confirmed by Lita Mains  MD, Caiden Arteaga ([id number]) on 04/22/2015 7:26:31 AM      MDM   Final diagnoses:  SOB (shortness of breath)  COPD exacerbation    Patient received DuoNeb prior to my examination. Mild improvement of her shortness of breath. Will start on continuous nebulizer, slight  Medrol.  Patient's shortness breath is proved after continuous though she is still wheezing in all lung fields. Discussed with hospitalist and we'll admit the patient.  Julianne Rice, MD 04/22/15 [phone]

## 2015-04-22 DIAGNOSIS — Z79891 Long term (current) use of opiate analgesic: Secondary | ICD-10-CM | POA: Diagnosis not present

## 2015-04-22 DIAGNOSIS — Z833 Family history of diabetes mellitus: Secondary | ICD-10-CM | POA: Diagnosis not present

## 2015-04-22 DIAGNOSIS — J449 Chronic obstructive pulmonary disease, unspecified: Secondary | ICD-10-CM | POA: Diagnosis not present

## 2015-04-22 DIAGNOSIS — Z91013 Allergy to seafood: Secondary | ICD-10-CM | POA: Diagnosis not present

## 2015-04-22 DIAGNOSIS — M069 Rheumatoid arthritis, unspecified: Secondary | ICD-10-CM | POA: Diagnosis present

## 2015-04-22 DIAGNOSIS — Z923 Personal history of irradiation: Secondary | ICD-10-CM | POA: Diagnosis not present

## 2015-04-22 DIAGNOSIS — Z87891 Personal history of nicotine dependence: Secondary | ICD-10-CM | POA: Diagnosis not present

## 2015-04-22 DIAGNOSIS — D6481 Anemia due to antineoplastic chemotherapy: Secondary | ICD-10-CM | POA: Diagnosis present

## 2015-04-22 DIAGNOSIS — E440 Moderate protein-calorie malnutrition: Secondary | ICD-10-CM | POA: Diagnosis not present

## 2015-04-22 DIAGNOSIS — Z6821 Body mass index (BMI) 21.0-21.9, adult: Secondary | ICD-10-CM | POA: Diagnosis not present

## 2015-04-22 DIAGNOSIS — R05 Cough: Secondary | ICD-10-CM | POA: Diagnosis not present

## 2015-04-22 DIAGNOSIS — J45909 Unspecified asthma, uncomplicated: Secondary | ICD-10-CM | POA: Diagnosis present

## 2015-04-22 DIAGNOSIS — Z79899 Other long term (current) drug therapy: Secondary | ICD-10-CM | POA: Diagnosis not present

## 2015-04-22 DIAGNOSIS — Z803 Family history of malignant neoplasm of breast: Secondary | ICD-10-CM | POA: Diagnosis not present

## 2015-04-22 DIAGNOSIS — Z9221 Personal history of antineoplastic chemotherapy: Secondary | ICD-10-CM | POA: Diagnosis not present

## 2015-04-22 DIAGNOSIS — R0600 Dyspnea, unspecified: Secondary | ICD-10-CM

## 2015-04-22 DIAGNOSIS — J441 Chronic obstructive pulmonary disease with (acute) exacerbation: Secondary | ICD-10-CM | POA: Diagnosis not present

## 2015-04-22 DIAGNOSIS — J9601 Acute respiratory failure with hypoxia: Secondary | ICD-10-CM | POA: Diagnosis not present

## 2015-04-22 DIAGNOSIS — I248 Other forms of acute ischemic heart disease: Secondary | ICD-10-CM | POA: Diagnosis not present

## 2015-04-22 DIAGNOSIS — C3490 Malignant neoplasm of unspecified part of unspecified bronchus or lung: Secondary | ICD-10-CM | POA: Diagnosis present

## 2015-04-22 DIAGNOSIS — Z7952 Long term (current) use of systemic steroids: Secondary | ICD-10-CM | POA: Diagnosis not present

## 2015-04-22 DIAGNOSIS — R590 Localized enlarged lymph nodes: Secondary | ICD-10-CM | POA: Diagnosis present

## 2015-04-22 DIAGNOSIS — E785 Hyperlipidemia, unspecified: Secondary | ICD-10-CM | POA: Diagnosis not present

## 2015-04-22 DIAGNOSIS — R0602 Shortness of breath: Secondary | ICD-10-CM | POA: Diagnosis not present

## 2015-04-22 DIAGNOSIS — C3411 Malignant neoplasm of upper lobe, right bronchus or lung: Secondary | ICD-10-CM | POA: Diagnosis not present

## 2015-04-22 DIAGNOSIS — R0603 Acute respiratory distress: Secondary | ICD-10-CM | POA: Diagnosis present

## 2015-04-22 LAB — MRSA PCR SCREENING: MRSA by PCR: NEGATIVE

## 2015-04-22 LAB — BASIC METABOLIC PANEL
Anion gap: 11 (ref 5–15)
BUN: 11 mg/dL (ref 6–20)
CALCIUM: 9.3 mg/dL (ref 8.9–10.3)
CO2: 27 mmol/L (ref 22–32)
CREATININE: 0.89 mg/dL (ref 0.44–1.00)
Chloride: 102 mmol/L (ref 101–111)
GFR calc Af Amer: >60 mL/min (ref >60)
GLUCOSE: 124 mg/dL — AB (ref 65–99)
Potassium: 4 mmol/L (ref 3.5–5.1)
Sodium: 140 mmol/L (ref 135–145)

## 2015-04-22 LAB — TROPONIN I
TROPONIN I: 0.12 ng/mL — AB (ref <0.031)
TROPONIN I: 0.07 ng/mL — AB (ref <0.031)
TROPONIN I: 0.05 ng/mL — AB (ref <0.031)

## 2015-04-22 LAB — BRAIN NATRIURETIC PEPTIDE: B Natriuretic Peptide: 116.7 pg/mL — ABNORMAL HIGH (ref 0.0–100.0)

## 2015-04-22 MED ORDER — ALBUTEROL SULFATE (2.5 MG/3ML) 0.083% IN NEBU: 2.5000 mg | INHALATION_SOLUTION | Freq: Three times a day (TID) | RESPIRATORY_TRACT | Status: DC

## 2015-04-22 MED ORDER — SODIUM CHLORIDE 0.9 % IV SOLN
INTRAVENOUS | Status: AC
  Administered 2015-04-22: 75 mL via INTRAVENOUS

## 2015-04-22 MED ORDER — IPRATROPIUM-ALBUTEROL 0.5-2.5 (3) MG/3ML IN SOLN
3.0000 mL | RESPIRATORY_TRACT | Status: DC
  Administered 2015-04-22 – 2015-04-23 (×8): 3 mL via RESPIRATORY_TRACT
  Filled 2015-04-22 (×9): qty 3

## 2015-04-22 MED ORDER — ALBUTEROL SULFATE (2.5 MG/3ML) 0.083% IN NEBU: 2.5000 mg | INHALATION_SOLUTION | RESPIRATORY_TRACT | Status: DC | PRN

## 2015-04-22 MED ORDER — ONDANSETRON 4 MG PO TBDP
8.0000 mg | ORAL_TABLET | Freq: Three times a day (TID) | ORAL | Status: DC | PRN
  Filled 2015-04-22: qty 1

## 2015-04-22 MED ORDER — SODIUM CHLORIDE 0.9 % IJ SOLN
10.0000 mL | INTRAMUSCULAR | Status: DC | PRN
  Administered 2015-04-22 – 2015-04-23 (×2): 10 mL
  Filled 2015-04-22 (×2): qty 40

## 2015-04-22 MED ORDER — SIMVASTATIN 10 MG PO TABS
5.0000 mg | ORAL_TABLET | Freq: Every day | ORAL | Status: DC
  Administered 2015-04-22: 5 mg via ORAL
  Filled 2015-04-22 (×2): qty 1

## 2015-04-22 MED ORDER — HEPARIN SODIUM (PORCINE) 5000 UNIT/ML IJ SOLN
5000.0000 [IU] | Freq: Three times a day (TID) | INTRAMUSCULAR | Status: DC
  Administered 2015-04-22 – 2015-04-23 (×4): 5000 [IU] via SUBCUTANEOUS
  Filled 2015-04-22 (×7): qty 1

## 2015-04-22 MED ORDER — HYDROCODONE-ACETAMINOPHEN 5-325 MG PO TABS: 1.0000 | ORAL_TABLET | ORAL | Status: DC | PRN

## 2015-04-22 MED ORDER — SODIUM CHLORIDE 0.9 % IJ SOLN
3.0000 mL | Freq: Two times a day (BID) | INTRAMUSCULAR | Status: DC
  Administered 2015-04-22 (×2): 3 mL via INTRAVENOUS

## 2015-04-22 MED ORDER — METHYLPREDNISOLONE SODIUM SUCC 125 MG IJ SOLR
60.0000 mg | Freq: Two times a day (BID) | INTRAMUSCULAR | Status: DC
Start: 2015-04-22 — End: 2015-04-23
  Administered 2015-04-22 – 2015-04-23 (×3): 60 mg via INTRAVENOUS
  Filled 2015-04-22 (×3): qty 2

## 2015-04-22 MED ORDER — AZITHROMYCIN 250 MG PO TABS
500.0000 mg | ORAL_TABLET | Freq: Every day | ORAL | Status: AC
  Administered 2015-04-22: 500 mg via ORAL
  Filled 2015-04-22: qty 2

## 2015-04-22 MED ORDER — ALBUTEROL SULFATE HFA 108 (90 BASE) MCG/ACT IN AERS: 2.0000 | INHALATION_SPRAY | RESPIRATORY_TRACT | Status: DC | PRN

## 2015-04-22 MED ORDER — AZITHROMYCIN 250 MG PO TABS
250.0000 mg | ORAL_TABLET | Freq: Every day | ORAL | Status: DC
  Administered 2015-04-23: 250 mg via ORAL
  Filled 2015-04-22: qty 1

## 2015-04-22 MED ORDER — IPRATROPIUM BROMIDE 0.02 % IN SOLN: 0.5000 mg | Freq: Four times a day (QID) | RESPIRATORY_TRACT | Status: DC | PRN

## 2015-04-22 MED ORDER — IPRATROPIUM BROMIDE 0.02 % IN SOLN: 0.5000 mg | Freq: Three times a day (TID) | RESPIRATORY_TRACT | Status: DC

## 2015-04-22 MED ORDER — PROCHLORPERAZINE MALEATE 10 MG PO TABS
10.0000 mg | ORAL_TABLET | Freq: Four times a day (QID) | ORAL | Status: DC | PRN
  Filled 2015-04-22: qty 1

## 2015-04-22 NOTE — Progress Notes (Signed)
Utilization review completed.  

## 2015-04-22 NOTE — Progress Notes (Addendum)
Pt admitted after midnight so please refer to admission note done 04/22/2015 for details on HPI and A/P.  74 year old female with past medical history of metastatic non small cell lung cancer (SCC) diagnosed in 12/2011, status post palliative radiotherapy to the left lung mass under the care of Dr. Pablo Ledger completed on 03/06/2012, status post ystemic chemotherapy with carboplatin and Abraxane (for total of 3 cycles), systemic chemotherapy with carboplatin and gemcitabine (status post 6 cycles), status post palliative radiotherapy to the right hilum under the care of Dr. Pablo Ledger completed on 11/26/2013 and currently undergoing immunotherapy with Nivolumab every 2 weeks (first dose given 03/07/2015 and most recent 04/18/15). Pt presented to [facility] ED with worsening shortness of breath and wheezing over past 24 hours prior to this admission. CXR on admission showed right hilar mass concerning for malignancy as well as mediastinal lymphadenopathy. She also had slight elevation in troponin level at 0.12. She was admitted to SDU. She was started on empiric abx azithromycin.   Assessment and Plan:  Acute respiratory failure with hypoxia / Possible bronchitis / COPD exacerbation - Will continue nebulizer treatment as started on admission  - Continue solumedrol 60 mg IV Q 12 hours - Continue azithromycin  - Transfer to telemetry floor today  Mild troponin elevation - Likely demand ischemia from COPD exacerbation, hypoxia - Trending down   Leisa Lenz [facility] [phone]

## 2015-04-22 NOTE — H&P (Signed)
[facility] History and Physical  Laken Rog EAV:[id number] DOB: Mar 25, 1941 DOA: 04/21/2015  Referring physician: EDP PCP: Thressa Sheller, MD   Chief Complaint: Respiratory distress   HPI: Destiny Harrison is a 74 y.o. female with h/o COPD, NSCLC currently undergoing chemotherapy with Opdivo last dose on 6/28.  Patient presents to the ED with SOB.  Patient is in respiratory distress with diffuse wheezing.  Symptoms onset earlier today.  Neb treatments at home didn't help much, neb treatment in ED provided some minimal relief.  Cough productive of yellow sputum.  No fever, no chills.  No chest pain.  Review of Systems: Systems reviewed.  As above, otherwise negative  Past Medical History  Diagnosis Date  . COPD (chronic obstructive pulmonary disease)   . Neutropenia, drug-induced 05/05/2012  . Hyperlipidemia   . Rheumatoid arthritis(714.0)   . Asthma   . Hiatal hernia   . History of chemotherapy   . History of radiation therapy 03/06/2012    left hilum  . History of radiation therapy 05/10/2013-05/31/2013    Left lung/ 33/75'@2'$ .25 per fraction x 15 fractions  . Radiation 11/15/13-11/26/13    Right hilum 30 Gy in 10 fractions  . Non-small cell lung cancer dx'd 08/28/11    left lung   Past Surgical History  Procedure Laterality Date  . Appendex  1962  . Video bronchoscopy  01/28/2012    Procedure: VIDEO BRONCHOSCOPY WITHOUT FLUORO;  Surgeon: Brand Males, MD;  Location: [facility] ENDOSCOPY;  Service: Endoscopy;  Laterality: Bilateral;  . Surgery on right wrist     Social History:  reports that she quit smoking about 5 years ago. Her smoking use included Cigarettes. She has a 20 pack-year smoking history. She has never used smokeless tobacco. She reports that she does not drink alcohol or use illicit drugs.  Allergies  Allergen Reactions  . Shellfish Allergy Swelling    Family History  Problem Relation Age of Onset  . Diabetes Mother     insulin dependent  . Breast cancer Mother       Prior to Admission medications   Medication Sig Start Date End Date Taking? Authorizing Provider  albuterol (PROVENTIL) (2.5 MG/3ML) 0.083% nebulizer solution Take 2.5 mg by nebulization every 8 (eight) hours.   Yes Historical Provider, MD  albuterol (VENTOLIN HFA) 108 (90 BASE) MCG/ACT inhaler Inhale 2 puffs into the lungs every 3 (three) hours as needed for wheezing or shortness of breath. 02/22/14  Yes Brand Males, MD  calcium-vitamin D (OSCAL WITH D) 500-200 MG-UNIT per tablet Take 1 tablet by mouth daily.   Yes Historical Provider, MD  folic acid (FOLVITE) 782 MCG tablet Take 400 mcg by mouth daily.   Yes Historical Provider, MD  HYDROcodone-acetaminophen (NORCO/VICODIN) 5-325 MG per tablet Take 1 tablet by mouth every 4 (four) hours as needed for moderate pain. 04/04/15  Yes Maryanna Shape, NP  ipratropium (ATROVENT) 0.02 % nebulizer solution Take 0.5 mg by nebulization every 8 (eight) hours.   Yes Historical Provider, MD  ondansetron (ZOFRAN ODT) 8 MG disintegrating tablet Take 1 tablet (8 mg total) by mouth every 8 (eight) hours as needed for nausea or vomiting. 04/04/15  Yes Maryanna Shape, NP  predniSONE (DELTASONE) 5 MG tablet Take 5 mg by mouth daily.    Yes Historical Provider, MD  prochlorperazine (COMPAZINE) 10 MG tablet Take 1 tablet (10 mg total) by mouth every 6 (six) hours as needed for nausea or vomiting. 03/21/15  Yes Adrena E Johnson, PA-C  simvastatin (ZOCOR) 5  MG tablet Take 5 mg by mouth at bedtime.   Yes Historical Provider, MD  PROAIR HFA 108 (90 BASE) MCG/ACT inhaler INHALE 2 PUFFS BY MOUTH EVERY 4 HOURS AS NEEDED FOR WHEEZING OR SHORTNESS OF BREATH 04/21/15   Brand Males, MD   Physical Exam: Filed Vitals:   04/22/15 0130  BP: 136/69  Pulse: 144  Resp: 26    BP 136/69 mmHg  Pulse 144  Resp 26  SpO2 91%  General Appearance:    Alert, oriented, no distress, appears stated age  Head:    Normocephalic, atraumatic  Eyes:    PERRL, EOMI, sclera  non-icteric        Nose:   Nares without drainage or epistaxis. Mucosa, turbinates normal  Throat:   Moist mucous membranes. Oropharynx without erythema or exudate.  Neck:   Supple. No carotid bruits.  No thyromegaly.  No lymphadenopathy.   Back:     No CVA tenderness, no spinal tenderness  Lungs:     Diffuse wheezes, accessory muscle use, markedly increased WOB and expiratory phase.  Chest wall:    No tenderness to palpitation  Heart:    Regular rate and rhythm without murmurs, gallops, rubs  Abdomen:     Soft, non-tender, nondistended, normal bowel sounds, no organomegaly  Genitalia:    deferred  Rectal:    deferred  Extremities:   No clubbing, cyanosis or edema.  Pulses:   2+ and symmetric all extremities  Skin:   Skin color, texture, turgor normal, no rashes or lesions  Lymph nodes:   Cervical, supraclavicular, and axillary nodes normal  Neurologic:   CNII-XII intact. Normal strength, sensation and reflexes      throughout    Labs on Admission:  Basic Metabolic Panel:  Recent Labs Lab 04/18/15 1400 04/21/15 2316  NA 140 140  K 3.6 4.0  CL  --  102  CO2 27 27  GLUCOSE 140 124*  BUN 9.8 11  CREATININE 0.9 0.89  CALCIUM 9.2 9.3   Liver Function Tests:  Recent Labs Lab 04/18/15 1400  AST 19  ALT 10  ALKPHOS 70  BILITOT 0.52  PROT 7.2  ALBUMIN 3.2*   No results for input(s): LIPASE, AMYLASE in the last 168 hours. No results for input(s): AMMONIA in the last 168 hours. CBC:  Recent Labs Lab 04/18/15 1400 04/21/15 2316  WBC 6.4 6.7  NEUTROABS 3.8  --   HGB 11.3* 11.9*  HCT 34.8 36.1  MCV 81.6 81.9  PLT 321 334   Cardiac Enzymes: No results for input(s): CKTOTAL, CKMB, CKMBINDEX, TROPONINI in the last 168 hours.  BNP (last 3 results) No results for input(s): PROBNP in the last 8760 hours. CBG: No results for input(s): GLUCAP in the last 168 hours.  Radiological Exams on Admission: Dg Chest Port 1 View  04/21/2015   CLINICAL DATA:  Acute onset of  severe shortness of breath. Known lung cancer. Initial encounter.  EXAM: PORTABLE CHEST - 1 VIEW  COMPARISON:  CT of the chest performed 02/09/2015, and chest radiograph performed 06/24/2014  FINDINGS: The patient's right hilar mass is again noted, concerning for malignancy. Known mediastinal lymphadenopathy is not well characterized on radiograph, except at the right paratracheal region. No superimposed focal airspace consolidation is seen. No pleural effusion or pneumothorax is identified.  The cardiomediastinal silhouette remains normal in size. A right-sided chest port is noted ending about the mid SVC. No acute osseous abnormalities identified.  IMPRESSION: Right hilar mass remains concerning for malignancy. Known  mediastinal lymphadenopathy is not well characterized on radiograph, except for the large mass in the right paratracheal region. No superimposed focal airspace consolidation seen.   Electronically Signed   By: Garald Balding M.D.   On: 04/21/2015 23:31    EKG: Independently reviewed.  Assessment/Plan Principal Problem:   COPD exacerbation Active Problems:   COPD, moderate   Lung cancer   Chronic cough   Respiratory distress   1. COPD exacerbation - 1. Adult wheeze protocol for neb treatments 2. Steroids 3. Z-Pak 4. Have ordered PRN BIPAP to try and ease work of breathing 5. Tele monitor and continuous pulse ox 2. Elevated troponin in ED - serial trops ordered, could have demand ischemia but doubt primary problem is traditional ACS. 3. Lung cancer - Chronic, got chemo on 6/28, putting consult into epic for Dr. Earlie Server 1. No visible pneumonitis on CXR to suggest this represents autoimmune reaction due to chemo agent    Code Status: Full  Family Communication: Family at bedside Disposition Plan: Admit to SDU   Time spent: 70 min  Berea Majkowski M. [facility] Pager [phone]  If 7AM-7PM, please contact the day team taking care of the  patient [email or web address] Password [email or web address] 04/22/2015, 1:50 AM

## 2015-04-23 DIAGNOSIS — C3411 Malignant neoplasm of upper lobe, right bronchus or lung: Secondary | ICD-10-CM

## 2015-04-23 DIAGNOSIS — E785 Hyperlipidemia, unspecified: Secondary | ICD-10-CM

## 2015-04-23 MED ORDER — HEPARIN SOD (PORK) LOCK FLUSH 100 UNIT/ML IV SOLN
500.0000 [IU] | INTRAVENOUS | Status: AC | PRN
  Administered 2015-04-23: 500 [IU]

## 2015-04-23 MED ORDER — AZITHROMYCIN 250 MG PO TABS: ORAL_TABLET | ORAL | Status: DC

## 2015-04-23 NOTE — Discharge Summary (Signed)
Physician Discharge Summary  Destiny Harrison RCV:[id number] DOB: 05-15-1941 DOA: 04/21/2015  PCP: Thressa Sheller, MD  Admit date: 04/21/2015 Discharge date: 04/23/2015  Recommendations for Outpatient Follow-up:  1. Patient will continue taking azithromycin 250 mg daily for 4 days on discharge.  Discharge Diagnoses:  Principal Problem:   COPD exacerbation Active Problems:   COPD, moderate   Lung cancer   Chronic cough   Respiratory distress   Acute respiratory failure with hypoxia    Discharge Condition: stable   Diet recommendation: as tolerated   History of present illness:  74 year old female with past medical history of metastatic non small cell lung cancer (SCC) diagnosed in 12/2011, status post palliative radiotherapy to the left lung mass under the care of Dr. Pablo Ledger completed on 03/06/2012, status post ystemic chemotherapy with carboplatin and Abraxane (for total of 3 cycles), systemic chemotherapy with carboplatin and gemcitabine (status post 6 cycles), status post palliative radiotherapy to the right hilum under the care of Dr. Pablo Ledger completed on 11/26/2013 and currently undergoing immunotherapy with Nivolumab every 2 weeks (first dose given 03/07/2015 and most recent 04/18/15).  Pt presented to [facility] ED with worsening shortness of breath and wheezing over past 24 hours prior to this admission. CXR on admission showed right hilar mass concerning for malignancy as well as mediastinal lymphadenopathy. She also had slight elevation in troponin level at 0.12. She was admitted to SDU. She was started on empiric abx azithromycin.   Hospital Course:  Principal Problem:   COPD exacerbation Active Problems:   COPD, moderate   Lung cancer   Chronic cough   Respiratory distress   Acute respiratory failure with hypoxia   Assessment and Plan:  Acute respiratory failure with hypoxia / Possible bronchitis / COPD exacerbation - Patient feels much better this morning. She will  continue takingit inhaler and nebulizer regimen as per prior home regimen before this admission. - She is on low-dose prednisone and she can continue taking this.  - She can continue taking azithromycin 250 mg once a day for next 4 days on discharge. - Her chest x-ray on admission showed a right hilar mass concerning for malignancy and mediastinal lymphadenopathy. - She is following on outpatient basis with Dr. Earlie Server.  Mild troponin elevation - Likely demand ischemia from COPD exacerbation, hypoxia - No complaints of chest pain. - Troponin level has trended down to 0.05.  Anemia of chronic disease - Likely secondary to malignancy, previous chemotherapy - Hemoglobin is 11.9. - Stable.  Dyslipidemia - Continue simvastatin on discharge  Moderate protein calorie malnutrition - In the context of chronic illness - Diet liberalized to as whatever patient tolerates  DVT prophylaxis - Heparin subcutaneous while patient in hospital   Signed:  Leisa Lenz, MD  [facility] 04/23/2015, 9:19 AM  Pager #: [phone]  Time spent in minutes: less than 30 minutes    Discharge Exam: Filed Vitals:   04/23/15 0552  BP: 133/72  Pulse: 105  Temp: 98 F (36.7 C)  Resp: 20   Filed Vitals:   04/22/15 2351 04/23/15 0323 04/23/15 0552 04/23/15 0841  BP:   133/72   Pulse:   105   Temp:   98 F (36.7 C)   TempSrc:   Oral   Resp:   20   Height:      Weight:      SpO2: 97% 97% 97% 96%    General: Pt is alert, follows commands appropriately, not in acute distress Cardiovascular: Regular rate and rhythm, S1/S2 +,  no murmurs Respiratory: Diminished breath sounds but no wheezing Abdominal: Soft, non tender, non distended, bowel sounds +, no guarding Extremities: no edema, no cyanosis, pulses palpable bilaterally DP and PT Neuro: Grossly nonfocal  Discharge Instructions  Discharge Instructions    Call MD for:  difficulty breathing, headache or visual disturbances     Complete by:  As directed      Call MD for:  persistant nausea and vomiting    Complete by:  As directed      Call MD for:  severe uncontrolled pain    Complete by:  As directed      Diet - low sodium heart healthy    Complete by:  As directed      Increase activity slowly    Complete by:  As directed             Medication List    TAKE these medications        albuterol (2.5 MG/3ML) 0.083% nebulizer solution  Commonly known as:  PROVENTIL  Take 2.5 mg by nebulization every 8 (eight) hours.     albuterol 108 (90 BASE) MCG/ACT inhaler  Commonly known as:  VENTOLIN HFA  Inhale 2 puffs into the lungs every 3 (three) hours as needed for wheezing or shortness of breath.     PROAIR HFA 108 (90 BASE) MCG/ACT inhaler  Generic drug:  albuterol  INHALE 2 PUFFS BY MOUTH EVERY 4 HOURS AS NEEDED FOR WHEEZING OR SHORTNESS OF BREATH     azithromycin 250 MG tablet  Commonly known as:  ZITHROMAX  Take 250 mg for 4 days     calcium-vitamin D 500-200 MG-UNIT per tablet  Commonly known as:  OSCAL WITH D  Take 1 tablet by mouth daily.     folic acid 332 MCG tablet  Commonly known as:  FOLVITE  Take 400 mcg by mouth daily.     HYDROcodone-acetaminophen 5-325 MG per tablet  Commonly known as:  NORCO/VICODIN  Take 1 tablet by mouth every 4 (four) hours as needed for moderate pain.     ipratropium 0.02 % nebulizer solution  Commonly known as:  ATROVENT  Take 0.5 mg by nebulization every 8 (eight) hours.     ondansetron 8 MG disintegrating tablet  Commonly known as:  ZOFRAN ODT  Take 1 tablet (8 mg total) by mouth every 8 (eight) hours as needed for nausea or vomiting.     predniSONE 5 MG tablet  Commonly known as:  DELTASONE  Take 5 mg by mouth daily.     prochlorperazine 10 MG tablet  Commonly known as:  COMPAZINE  Take 1 tablet (10 mg total) by mouth every 6 (six) hours as needed for nausea or vomiting.     simvastatin 5 MG tablet  Commonly known as:  ZOCOR  Take 5 mg by  mouth at bedtime.           Follow-up Information    Schedule an appointment as soon as possible for a visit with Thressa Sheller, MD.   Specialty:  Internal Medicine   Why:  Follow up appt after recent hospitalization   Contact information:   [address] [place] [address] [phone]        The results of significant diagnostics from this hospitalization (including imaging, microbiology, ancillary and laboratory) are listed below for reference.    Significant Diagnostic Studies: Dg Chest Port 1 View  04/21/2015   CLINICAL DATA:  Acute onset of severe shortness of  breath. Known lung cancer. Initial encounter.  EXAM: PORTABLE CHEST - 1 VIEW  COMPARISON:  CT of the chest performed 02/09/2015, and chest radiograph performed 06/24/2014  FINDINGS: The patient's right hilar mass is again noted, concerning for malignancy. Known mediastinal lymphadenopathy is not well characterized on radiograph, except at the right paratracheal region. No superimposed focal airspace consolidation is seen. No pleural effusion or pneumothorax is identified.  The cardiomediastinal silhouette remains normal in size. A right-sided chest port is noted ending about the mid SVC. No acute osseous abnormalities identified.  IMPRESSION: Right hilar mass remains concerning for malignancy. Known mediastinal lymphadenopathy is not well characterized on radiograph, except for the large mass in the right paratracheal region. No superimposed focal airspace consolidation seen.   Electronically Signed   By: Garald Balding M.D.   On: 04/21/2015 23:31    Microbiology: Recent Results (from the past 240 hour(s))  MRSA PCR Screening     Status: None   Collection Time: 04/22/15  3:12 AM  Result Value Ref Range Status   MRSA by PCR NEGATIVE NEGATIVE Final    Comment:        The GeneXpert MRSA Assay (FDA approved for NASAL specimens only), is one component of a comprehensive MRSA colonization surveillance  program. It is not intended to diagnose MRSA infection nor to guide or monitor treatment for MRSA infections.      Labs: Basic Metabolic Panel:  Recent Labs Lab 04/18/15 1400 04/21/15 2316  NA 140 140  K 3.6 4.0  CL  --  102  CO2 27 27  GLUCOSE 140 124*  BUN 9.8 11  CREATININE 0.9 0.89  CALCIUM 9.2 9.3   Liver Function Tests:  Recent Labs Lab 04/18/15 1400  AST 19  ALT 10  ALKPHOS 70  BILITOT 0.52  PROT 7.2  ALBUMIN 3.2*   No results for input(s): LIPASE, AMYLASE in the last 168 hours. No results for input(s): AMMONIA in the last 168 hours. CBC:  Recent Labs Lab 04/18/15 1400 04/21/15 2316  WBC 6.4 6.7  NEUTROABS 3.8  --   HGB 11.3* 11.9*  HCT 34.8 36.1  MCV 81.6 81.9  PLT 321 334   Cardiac Enzymes:  Recent Labs Lab 04/22/15 0320 04/22/15 0750 04/22/15 1352  TROPONINI 0.12* 0.07* 0.05*   BNP: BNP (last 3 results)  Recent Labs  04/21/15 2316  BNP 116.7*    ProBNP (last 3 results) No results for input(s): PROBNP in the last 8760 hours.  CBG: No results for input(s): GLUCAP in the last 168 hours.

## 2015-04-23 NOTE — Discharge Instructions (Signed)
Acute Respiratory Failure °Respiratory failure is when your lungs are not working well and your breathing (respiratory) system fails. When respiratory failure occurs, it is difficult for your lungs to get enough oxygen, get rid of carbon dioxide, or both. Respiratory failure can be life threatening.  °Respiratory failure can be acute or chronic. Acute respiratory failure is sudden, severe, and requires emergency medical treatment. Chronic respiratory failure is less severe, happens over time, and requires ongoing treatment.  °WHAT ARE THE CAUSES OF ACUTE RESPIRATORY FAILURE?  °Any problem affecting the heart or lungs can cause acute respiratory failure. Some of these causes include the following: °· Chronic bronchitis and emphysema (COPD).   °· Blood clot going to a lung (pulmonary embolism).   °· Having water in the lungs caused by heart failure, lung injury, or infection (pulmonary edema).   °· Collapsed lung (pneumothorax).   °· Pneumonia.   °· Pulmonary fibrosis.   °· Obesity.   °· Asthma.   °· Heart failure.   °· Any type of trauma to the chest that can make breathing difficult.   °· Nerve or muscle diseases making chest movements difficult. °WHAT SYMPTOMS SHOULD YOU WATCH FOR?  °If you have any of these signs or symptoms, you should seek immediate medical care:  °· You have shortness of breath (dyspnea) with or without activity.   °· You have rapid, fast breathing (tachypnea).   °· You are wheezing. °· You are unable to say more than a few words without having to catch your breath. °· You find it very difficult to function normally. °· You have a fast heart rate.   °· You have a bluish color to your finger or toe nail beds.   °· You have confusion or drowsiness or both.   °HOW WILL MY ACUTE RESPIRATORY FAILURE BE TREATED?  °Treatment of acute respiratory failure depends on the cause of the respiratory failure. Usually, you will stay in the intensive care unit so your breathing can be watched closely. Treatment  can include the following: °· Oxygen. Oxygen can be delivered through the following: °¨ Nasal cannula. This is small tubing that goes in your nose to give you oxygen. °¨ Face mask. A face mask covers your nose and mouth to give you oxygen. °· Medicine. Different medicines can be given to help with breathing. These can include: °¨ Nebulizers. Nebulizers deliver medicines to open the air passages (bronchodilators). These medicines help to open or relax the airways in the lungs so you can breathe better. They can also help loosen mucus from your lungs. °¨ Diuretics. Diuretic medicines can help you breathe better by getting rid of extra water in your body. °¨ Steroids. Steroid medicines can help decrease swelling (inflammation) in your lungs. °¨ Antibiotics. °· Chest tube. If you have a collapsed lung (pneumothorax), a chest tube is placed to help reinflate the lung. °· Non-invasive positive pressure ventilation (NPPV). This is a tight-fitting mask that goes over your nose and mouth. The mask has tubing that is attached to a machine. The machine blows air into the tubing, which helps to keep the tiny air sacs (alveoli) in your lungs open. This machine allows you to breathe on your own. °· Ventilator. A ventilator is a breathing machine. When on a ventilator, a breathing tube is put into the lungs. A ventilator is used when you can no longer breathe well enough on your own. You may have low oxygen levels or high carbon dioxide (CO2) levels in your blood. When you are on a ventilator, sedation and pain medicines are given to make you sleep   so your lungs can heal. °Document Released: 10/12/2013 Document Revised: 02/21/2014 Document Reviewed: 10/12/2013 °[employer]® Patient Information ©2015 [employer]. This information is not intended to replace advice given to you by your health care provider. Make sure you discuss any questions you have with your health care provider. ° °

## 2015-04-23 NOTE — Progress Notes (Signed)
Completed D/C teaching with patient and family. Answered all questions. Patient will be D/C home with family in stable condition.  Mercy Moore, RN

## 2015-04-27 ENCOUNTER — Ambulatory Visit (HOSPITAL_COMMUNITY)
Admission: RE | Admit: 2015-04-27 | Discharge: 2015-04-27 | Disposition: A | Discharge status: Home / Self Care | Payer: Medicare Other | Source: Ambulatory Visit | Attending: Physician Assistant | Admitting: Physician Assistant

## 2015-04-27 ENCOUNTER — Encounter (HOSPITAL_COMMUNITY): Payer: Self-pay

## 2015-04-27 DIAGNOSIS — Z85118 Personal history of other malignant neoplasm of bronchus and lung: Secondary | ICD-10-CM | POA: Insufficient documentation

## 2015-04-27 DIAGNOSIS — R911 Solitary pulmonary nodule: Secondary | ICD-10-CM | POA: Diagnosis not present

## 2015-04-27 DIAGNOSIS — C3411 Malignant neoplasm of upper lobe, right bronchus or lung: Secondary | ICD-10-CM

## 2015-04-27 DIAGNOSIS — R0602 Shortness of breath: Secondary | ICD-10-CM | POA: Diagnosis present

## 2015-04-27 DIAGNOSIS — J432 Centrilobular emphysema: Secondary | ICD-10-CM | POA: Diagnosis not present

## 2015-04-27 DIAGNOSIS — I2510 Atherosclerotic heart disease of native coronary artery without angina pectoris: Secondary | ICD-10-CM | POA: Insufficient documentation

## 2015-04-27 DIAGNOSIS — J9690 Respiratory failure, unspecified, unspecified whether with hypoxia or hypercapnia: Secondary | ICD-10-CM | POA: Diagnosis not present

## 2015-04-27 DIAGNOSIS — J439 Emphysema, unspecified: Secondary | ICD-10-CM | POA: Diagnosis not present

## 2015-04-27 MED ORDER — IOHEXOL 300 MG/ML  SOLN
100.0000 mL | Freq: Once | INTRAMUSCULAR | Status: AC | PRN
  Administered 2015-04-27: 80 mL via INTRAVENOUS

## 2015-05-02 ENCOUNTER — Other Ambulatory Visit (HOSPITAL_BASED_OUTPATIENT_CLINIC_OR_DEPARTMENT_OTHER): Payer: Medicare Other

## 2015-05-02 ENCOUNTER — Ambulatory Visit (HOSPITAL_BASED_OUTPATIENT_CLINIC_OR_DEPARTMENT_OTHER): Payer: Medicare Other

## 2015-05-02 ENCOUNTER — Ambulatory Visit (HOSPITAL_BASED_OUTPATIENT_CLINIC_OR_DEPARTMENT_OTHER): Payer: Medicare Other | Admitting: Physician Assistant

## 2015-05-02 ENCOUNTER — Encounter: Payer: Self-pay | Admitting: Physician Assistant

## 2015-05-02 VITALS — BP 124/76 | HR 111 | Temp 98.7°F | Resp 18 | Ht 65.0 in | Wt 132.7 lb

## 2015-05-02 VITALS — HR 88

## 2015-05-02 DIAGNOSIS — M545 Low back pain: Secondary | ICD-10-CM

## 2015-05-02 DIAGNOSIS — G8929 Other chronic pain: Secondary | ICD-10-CM

## 2015-05-02 DIAGNOSIS — C3412 Malignant neoplasm of upper lobe, left bronchus or lung: Secondary | ICD-10-CM

## 2015-05-02 DIAGNOSIS — C3490 Malignant neoplasm of unspecified part of unspecified bronchus or lung: Secondary | ICD-10-CM

## 2015-05-02 DIAGNOSIS — C3411 Malignant neoplasm of upper lobe, right bronchus or lung: Secondary | ICD-10-CM

## 2015-05-02 DIAGNOSIS — R0609 Other forms of dyspnea: Secondary | ICD-10-CM

## 2015-05-02 DIAGNOSIS — R05 Cough: Secondary | ICD-10-CM | POA: Diagnosis not present

## 2015-05-02 DIAGNOSIS — R5382 Chronic fatigue, unspecified: Secondary | ICD-10-CM

## 2015-05-02 DIAGNOSIS — Z79899 Other long term (current) drug therapy: Secondary | ICD-10-CM | POA: Diagnosis not present

## 2015-05-02 DIAGNOSIS — Z5112 Encounter for antineoplastic immunotherapy: Secondary | ICD-10-CM | POA: Diagnosis present

## 2015-05-02 LAB — CBC WITH DIFFERENTIAL/PLATELET
BASO%: 1.1 % (ref 0.0–2.0)
Basophils Absolute: 0.1 10*3/uL (ref 0.0–0.1)
EOS ABS: 0.5 10*3/uL (ref 0.0–0.5)
EOS%: 6.8 % (ref 0.0–7.0)
HEMATOCRIT: 38.9 % (ref 34.8–46.6)
HGB: 12.7 g/dL (ref 11.6–15.9)
LYMPH%: 30.3 % (ref 14.0–49.7)
MCH: 26.7 pg (ref 25.1–34.0)
MCHC: 32.6 g/dL (ref 31.5–36.0)
MCV: 81.9 fL (ref 79.5–101.0)
MONO#: 1.1 10*3/uL — ABNORMAL HIGH (ref 0.1–0.9)
MONO%: 15.8 % — ABNORMAL HIGH (ref 0.0–14.0)
NEUT%: 46 % (ref 38.4–76.8)
NEUTROS ABS: 3.1 10*3/uL (ref 1.5–6.5)
PLATELETS: 269 10*3/uL (ref 145–400)
RBC: 4.75 10*6/uL (ref 3.70–5.45)
RDW: 16.1 % — AB (ref 11.2–14.5)
WBC: 6.7 10*3/uL (ref 3.9–10.3)
lymph#: 2 10*3/uL (ref 0.9–3.3)

## 2015-05-02 LAB — COMPREHENSIVE METABOLIC PANEL (CC13)
ALK PHOS: 78 U/L (ref 40–150)
ALT: 14 U/L (ref 0–55)
ANION GAP: 8 meq/L (ref 3–11)
AST: 16 U/L (ref 5–34)
Albumin: 3.7 g/dL (ref 3.5–5.0)
BUN: 11.3 mg/dL (ref 7.0–26.0)
CALCIUM: 9.8 mg/dL (ref 8.4–10.4)
CO2: 29 meq/L (ref 22–29)
Chloride: 105 mEq/L (ref 98–109)
Creatinine: 0.9 mg/dL (ref 0.6–1.1)
EGFR: 76 mL/min/{1.73_m2} — AB (ref >90)
GLUCOSE: 78 mg/dL (ref 70–140)
Potassium: 4.1 mEq/L (ref 3.5–5.1)
Sodium: 142 mEq/L (ref 136–145)
TOTAL PROTEIN: 7.5 g/dL (ref 6.4–8.3)
Total Bilirubin: 0.63 mg/dL (ref 0.20–1.20)

## 2015-05-02 LAB — TSH CHCC: TSH: 2.301 m(IU)/L (ref 0.308–3.960)

## 2015-05-02 MED ORDER — HEPARIN SOD (PORK) LOCK FLUSH 100 UNIT/ML IV SOLN
500.0000 [IU] | Freq: Once | INTRAVENOUS | Status: AC | PRN
  Administered 2015-05-02: 500 [IU]
  Filled 2015-05-02: qty 5

## 2015-05-02 MED ORDER — HYDROCODONE-ACETAMINOPHEN 5-325 MG PO TABS: 1.0000 | ORAL_TABLET | ORAL | Status: DC | PRN

## 2015-05-02 MED ORDER — SODIUM CHLORIDE 0.9 % IJ SOLN
10.0000 mL | INTRAMUSCULAR | Status: DC | PRN
  Administered 2015-05-02: 10 mL
  Filled 2015-05-02: qty 10

## 2015-05-02 MED ORDER — SODIUM CHLORIDE 0.9 % IV SOLN
Freq: Once | INTRAVENOUS | Status: AC
  Administered 2015-05-02: 15:00:00 via INTRAVENOUS

## 2015-05-02 MED ORDER — SODIUM CHLORIDE 0.9 % IV SOLN
3.1000 mg/kg | Freq: Once | INTRAVENOUS | Status: AC
  Administered 2015-05-02: 200 mg via INTRAVENOUS
  Filled 2015-05-02: qty 20

## 2015-05-02 NOTE — Progress Notes (Addendum)
[facility] [phone]   Fax:(336) [place], [place], [address] [place] [address]  DIAGNOSIS: Metastatic non-small cell lung cancer, squamous cell carcinoma diagnosed in March of 2013.   PRIOR THERAPY:  1. Status post palliative radiotherapy to the left lung mass under the care of Dr. Pablo Ledger completed on 03/06/2012.  2. Systemic chemotherapy with carboplatin for AUC of 6 on day 1 and Abraxane 100 mg/M2 on days 1, 8 and 15 every 3 weeks. Status post 2 cycles. From cycle 3 forward AUC will be decreased to 4.5 given on day 1 and the Abraxane will be decreased to 90 mg per meter squared on days 1, 8 and 15 every 3 weeks, Status post a total of 3 cycles.  3. Systemic chemotherapy with carboplatin for AUC of 5 on day 1 and gemcitabine 1000 mg/m2 given on day 1 and day 8 every 3 weeks,status post 1 cycle. Due to significant neutropenia she will be dosed reduced beginning cycle 2 forward to carboplatin at an AUC of 4 given on day 1 and gemcitabine at 800 mg per meter squared given on days 1 and 8 every 3 weeks. Status post 6 cycles.  4. Status post palliative radiotherapy to the right hilum under the care of Dr. Pablo Ledger completed on 11/26/2013.  CURRENT THERAPY: Immunotherapy with Nivolumab 3 MG/KG every 2 weeks. First dose given 03/07/2015. Status post 4 cycles.  CHEMOTHERAPY INTENT: Palliative  CURRENT # OF CHEMOTHERAPY CYCLES: 5  CURRENT ANTIEMETICS: Compazine  CURRENT SMOKING STATUS: Former smoker  ORAL CHEMOTHERAPY AND CONSENT: None  CURRENT BISPHOSPHONATES USE: None  PAIN MANAGEMENT: 5/10 right shoulder currently on Norco  NARCOTICS INDUCED CONSTIPATION: None  LIVING WILL AND CODE STATUS: No CODE BLUE   INTERVAL HISTORY: Destiny Harrison 74 y.o. female returns to the clinic today for routine followup visit accompanied by her son. The patient is feeling fine today with no specific complaints  except for mild persistent chronic back pain. She takes Vicodin for pain management on as-needed basis. She requests a refill for her Vicodin tablets. She reports improvement in her wheezing despite the recent humidity. She is using her nebulizer treatments 3 times daily in addition to her her inhalers as needed. She also continues to have a nonproductive cough however, this has improved. Overall she is tolerating her treatment with immunotherapy with Nivolumab relatively well.  She denied any changes to her baseline shortness of breath, denied skin rash or diarrhea. She denied having any significant weight loss or night sweats. She has no chest pain, but has shortness of breath increased with exertion and dry, cough with no hemoptysis. The patient denied having any significant fever or chills, nausea or vomiting. She recently had a restaging CT scan of the chest, abdomen and pelvis and presents to discuss the results.  MEDICAL HISTORY: Past Medical History  Diagnosis Date  . COPD (chronic obstructive pulmonary disease)   . Neutropenia, drug-induced 05/05/2012  . Hyperlipidemia   . Rheumatoid arthritis(714.0)   . Asthma   . Hiatal hernia   . History of chemotherapy   . History of radiation therapy 03/06/2012    left hilum  . History of radiation therapy 05/10/2013-05/31/2013    Left lung/ 33/75'@2'$ .25 per fraction x 15 fractions  . Radiation 11/15/13-11/26/13    Right hilum 30 Gy in 10 fractions  . Non-small cell lung cancer dx'd 08/28/11    left lung    ALLERGIES:  is allergic to shellfish allergy.  MEDICATIONS:  Current Outpatient Prescriptions  Medication Sig Dispense Refill  . albuterol (PROVENTIL) (2.5 MG/3ML) 0.083% nebulizer solution Take 2.5 mg by nebulization every 8 (eight) hours.    Marland Kitchen albuterol (VENTOLIN HFA) 108 (90 BASE) MCG/ACT inhaler Inhale 2 puffs into the lungs every 3 (three) hours as needed for wheezing or shortness of breath. 1 Inhaler 6  . azithromycin (ZITHROMAX) 250 MG  tablet Take 250 mg for 4 days 4 each 0  . calcium-vitamin D (OSCAL WITH D) 500-200 MG-UNIT per tablet Take 1 tablet by mouth daily.    . folic acid (FOLVITE) 093 MCG tablet Take 400 mcg by mouth daily.    Marland Kitchen HYDROcodone-acetaminophen (NORCO/VICODIN) 5-325 MG per tablet Take 1 tablet by mouth every 4 (four) hours as needed for moderate pain. 30 tablet 0  . ipratropium (ATROVENT) 0.02 % nebulizer solution Take 0.5 mg by nebulization every 8 (eight) hours.    . ondansetron (ZOFRAN ODT) 8 MG disintegrating tablet Take 1 tablet (8 mg total) by mouth every 8 (eight) hours as needed for nausea or vomiting. 20 tablet 0  . predniSONE (DELTASONE) 5 MG tablet Take 5 mg by mouth daily.     Marland Kitchen PROAIR HFA 108 (90 BASE) MCG/ACT inhaler INHALE 2 PUFFS BY MOUTH EVERY 4 HOURS AS NEEDED FOR WHEEZING OR SHORTNESS OF BREATH 8.5 g 5  . prochlorperazine (COMPAZINE) 10 MG tablet Take 1 tablet (10 mg total) by mouth every 6 (six) hours as needed for nausea or vomiting. 30 tablet 0  . simvastatin (ZOCOR) 5 MG tablet Take 5 mg by mouth at bedtime.     No current facility-administered medications for this visit.   Facility-Administered Medications Ordered in Other Visits  Medication Dose Route Frequency Provider Last Rate Last Dose  . heparin lock flush 100 unit/mL  500 Units Intracatheter Once PRN Curt Bears, MD      . nivolumab (OPDIVO) 200 mg in sodium chloride 0.9 % 100 mL chemo infusion  3.1 mg/kg (Treatment Plan Actual) Intravenous Once Curt Bears, MD 120 mL/hr at 05/02/15 1559 200 mg at 05/02/15 1559  . sodium chloride 0.9 % injection 10 mL  10 mL Intracatheter PRN Curt Bears, MD        SURGICAL HISTORY:  Past Surgical History  Procedure Laterality Date  . Appendex  1962  . Video bronchoscopy  01/28/2012    Procedure: VIDEO BRONCHOSCOPY WITHOUT FLUORO;  Surgeon: Brand Males, MD;  Location: [facility] END[facility]COPY;  Service: Endoscopy;  Laterality: Bilateral;  . Surgery on right wrist      REVIEW OF  SYSTEMS:  Constitutional: positive for fatigue Eyes: negative Ears, nose, mouth, throat, and face: negative Respiratory: positive for cough, dyspnea on exertion, wheezing and All improved Cardiovascular: negative Gastrointestinal: negative Genitourinary:negative Integument/breast: negative Hematologic/lymphatic: negative Musculoskeletal:negative Neurological: negative Behavioral/Psych: negative Endocrine: negative Allergic/Immunologic: negative   PHYSICAL EXAMINATION: General appearance: alert, cooperative and no distress Head: Normocephalic, without obvious abnormality, atraumatic Neck: no adenopathy, no JVD, supple, symmetrical, trachea midline and thyroid not enlarged, symmetric, no tenderness/mass/nodules Lymph nodes: Cervical, supraclavicular, and axillary nodes normal. Resp: wheezes bilaterally and Improved when compared to her examination 2 weeks ago Back: symmetric, no curvature. ROM normal. No CVA tenderness. Cardio: regular rate and rhythm, S1, S2 normal, no murmur, click, rub or gallop GI: soft, non-tender; bowel sounds normal; no masses,  no organomegaly Extremities: extremities normal, atraumatic, no cyanosis or edema Neurologic: Alert and oriented X 3, normal strength and tone. Normal symmetric reflexes. Normal  coordination and gait  ECOG PERFORMANCE STATUS: 1 - Symptomatic but completely ambulatory  Blood pressure 124/76, pulse 111, temperature 98.7 F (37.1 C), temperature source Oral, resp. rate 18, height '5\' 5"'$  (1.651 m), weight 132 lb 11.2 oz (60.192 kg), SpO2 96 %.  LABORATORY DATA: Lab Results  Component Value Date   WBC 6.7 05/02/2015   HGB 12.7 05/02/2015   HCT 38.9 05/02/2015   MCV 81.9 05/02/2015   PLT 269 05/02/2015      Chemistry      Component Value Date/Time   NA 142 05/02/2015 1414   NA 140 04/21/2015 2316   K 4.1 05/02/2015 1414   K 4.0 04/21/2015 2316   CL 102 04/21/2015 2316   CL 101 01/12/2013 0810   CO2 29 05/02/2015 1414   CO2 27  04/21/2015 2316   BUN 11.3 05/02/2015 1414   BUN 11 04/21/2015 2316   CREATININE 0.9 05/02/2015 1414   CREATININE 0.89 04/21/2015 2316      Component Value Date/Time   CALCIUM 9.8 05/02/2015 1414   CALCIUM 9.3 04/21/2015 2316   ALKPHOS 78 05/02/2015 1414   ALKPHOS 75 06/24/2014 2111   AST 16 05/02/2015 1414   AST 14 06/24/2014 2111   ALT 14 05/02/2015 1414   ALT 7 06/24/2014 2111   BILITOT 0.63 05/02/2015 1414   BILITOT 0.4 06/24/2014 2111       RADIOGRAPHIC STUDIES: Ct Chest W Contrast  04/27/2015   CLINICAL DATA:  74 year old female with history of right-sided lung cancer diagnosed in 2012. Restaging examination. Shortness of breath. Recent hospitalization for respiratory failure.  EXAM: CT CHEST WITH CONTRAST  TECHNIQUE: Multidetector CT imaging of the chest was performed during intravenous contrast administration.  CONTRAST:  42m OMNIPAQUE IOHEXOL 300 MG/ML  SOLN  COMPARISON:  Chest CT 02/09/2015.  FINDINGS: Mediastinum/Lymph Nodes: Previously noted right supraclavicular lymph node is larger than the prior examination measuring 11 mm in short axis on today's study (image 2 of series 2). Previously demonstrated mediastinal mass centered in the high right paratracheal station extending between the innominate artery and superior vena cava anteriorly is similar in size, measuring 3.7 x 2.7 cm on today's examination (Unchanged within measurement error when measured in a similar fashion on image 14 of series 2 of the prior examination). Subcarinal lymph node appears slightly larger than the prior examination, currently measuring 13 mm in short axis. Heart size is normal. Small amount of pericardial fluid and/or thickening overlying the left ventricular apex, unlikely to be of any hemodynamic significance at this time. No associated pericardial calcification. There is atherosclerosis of the thoracic aorta, the great vessels of the mediastinum and the coronary arteries, including calcified  atherosclerotic plaque in the left main, left anterior descending and left circumflex coronary arteries. Esophagus is unremarkable in appearance. No axillary lymphadenopathy.  Lungs/Pleura: Previously described perihilar mass in the central aspect of the right middle lobe is slightly smaller than the prior examination, currently a large nodule measuring approximately 2.0 x 2.7 cm (image 27 of series 2). No other suspicious appearing pulmonary nodules or masses are noted. 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 Mild diffuse bronchial wall thickening with mild to moderate centrilobular and paraseptal emphysema. Bilateral apical pleuroparenchymal thickening, somewhat nodular, similar to prior studies, most compatible with chronic post infectious or inflammatory scarring. No acute consolidative airspace disease. No pleural effusions.  Upper Abdomen: Calcified granulomas in the liver.  Musculoskeletal/Soft  Tissues: Chronic compression fracture at T4 with 30% loss of anterior vertebral body height is unchanged compared to prior examination. There are no aggressive appearing lytic or blastic lesions noted in the visualized portions of the skeleton.  IMPRESSION: 1. Today's study demonstrates a mixed response to therapy. Specifically, the large perihilar right middle lobe nodule has regressed compared to the prior examination, but subcarinal and right supraclavicular lymph nodes appear larger than the prior study. Other larger mediastinal nodal masses are stable compared to the prior examination. 2. Mild diffuse bronchial wall thickening with mild to moderate centrilobular and paraseptal emphysema; imaging findings again suggestive of underlying COPD. 3. Stable postradiation changes in the left lung, similar to prior examinations, without evidence of local recurrence of disease. 4. Atherosclerosis,  including left main and 2 vessel coronary artery disease. Assessment for potential risk factor modification, dietary therapy or pharmacologic therapy may be warranted, if clinically indicated.   Electronically Signed   By: Vinnie Langton M.D.   On: 04/27/2015 10:16   Dg Chest Port 1 View  04/21/2015   CLINICAL DATA:  Acute onset of severe shortness of breath. Known lung cancer. Initial encounter.  EXAM: PORTABLE CHEST - 1 VIEW  COMPARISON:  CT of the chest performed 02/09/2015, and chest radiograph performed 06/24/2014  FINDINGS: The patient's right hilar mass is again noted, concerning for malignancy. Known mediastinal lymphadenopathy is not well characterized on radiograph, except at the right paratracheal region. No superimposed focal airspace consolidation is seen. No pleural effusion or pneumothorax is identified.  The cardiomediastinal silhouette remains normal in size. A right-sided chest port is noted ending about the mid SVC. No acute osseous abnormalities identified.  IMPRESSION: Right hilar mass remains concerning for malignancy. Known mediastinal lymphadenopathy is not well characterized on radiograph, except for the large mass in the right paratracheal region. No superimposed focal airspace consolidation seen.   Electronically Signed   By: Garald Balding M.D.   On: 04/21/2015 23:31   ASSESSMENT AND PLAN: This is a very pleasant 74 year old African American female with metastatic non-small cell lung cancer, squamous cell carcinoma status post several chemotherapy regimen and and completed a course of palliative radiotherapy to the right hilum in February 2015.   Her recent CT scan of the chest, abdomen and pelvis showed evidence for disease progression with enlarging right hilar mass and progressive mediastinal lymphadenopathy as well as slight progression of bilateral supraclavicular lymph nodes.  She is currently receiving treatment with Nivolumab 3 MG/KG every 2 weeks. Status post 4 cycles.  She is overall tolerating her treatment well. Patient was discussed with and also seen by Dr. Julien Nordmann. Her recent restaging CT scan showed overall stable disease. There is slight increase and a couple of areas that we will monitor closely on her next restaging CT scans. She will proceed with cycle #5 of her immunotherapy with  Nivolumab today as scheduled. She'll follow-up in 2 weeks prior to cycle #6.  She was given a refill prescription for her Vicodin tablets.  She was advised to call immediately if she has any concerning symptoms in the interval.  The patient voices understanding of current disease status and treatment options and is in agreement with the current care plan.  All questions were answered. The patient knows to call the clinic with any problems, questions or concerns. We can certainly see the patient much sooner if necessary.  Carlton Adam PA-C  ADDENDUM: Hematology/Oncology Attending: I had a face to face encounter with the patient.  I recommended her care plan. This is a very pleasant 74 years old African-American female with metastatic non-small cell lung cancer, squamous cell carcinoma who is currently undergoing treatment with immunotherapy with Nivolumab status post 4 cycles. The patient is tolerating her treatment well with no significant adverse effects. The recent CT scan of the chest showed some evidence for mild disease progression with enlargement of the right hilar mass and mediastinal lymphadenopathy. This could be representing a pseudoprogression but real progression cannot be excluded at this time. I discussed the scan results with the patient and her son. I gave her the option of continuing treatment with Nivolumab for 4 more cycles followed by restaging scan versus switching her systemic treatment to chemotherapy. After discussion of the 2 options, the patient would like to continue her current treatment with Nivolumab for now. She will proceed with cycle #5  today as a scheduled. The patient would come back for follow-up visit in 2 weeks for reevaluation before starting cycle #6. For the chronic back pain, the patient was given prescription of Vicodin today. She was advised to call immediately if she has any concerning symptoms in the interval.  Disclaimer: This note was dictated with voice recognition software. Similar sounding words can inadvertently be transcribed and may be missed upon review. Eilleen Kempf., MD 05/03/2015

## 2015-05-02 NOTE — Patient Instructions (Signed)
Your recent restaging CT scan showed relatively stable disease Follow-up in 2 weeks prior to your next scheduled cycle of immunotherapy

## 2015-05-02 NOTE — Patient Instructions (Signed)
Cancer Center Discharge Instructions for Patients  Today you received the following: Nivolumab   To help prevent nausea and vomiting after your treatment, we encourage you to take your nausea medication as directed.    If you develop nausea and vomiting that is not controlled by your nausea medication, call the clinic.   BELOW ARE SYMPTOMS THAT SHOULD BE REPORTED IMMEDIATELY:  *FEVER GREATER THAN 100.5 F  *CHILLS WITH OR WITHOUT FEVER  NAUSEA AND VOMITING THAT IS NOT CONTROLLED WITH YOUR NAUSEA MEDICATION  *UNUSUAL SHORTNESS OF BREATH  *UNUSUAL BRUISING OR BLEEDING  TENDERNESS IN MOUTH AND THROAT WITH OR WITHOUT PRESENCE OF ULCERS  *URINARY PROBLEMS  *BOWEL PROBLEMS  UNUSUAL RASH Items with * indicate a potential emergency and should be followed up as soon as possible.  Feel free to call the clinic you have any questions or concerns. The clinic phone number is [phone].  Please show the CHEMO ALERT CARD at check-in to the Emergency Department and triage nurse.   

## 2015-05-03 ENCOUNTER — Telehealth: Payer: Self-pay | Admitting: *Deleted

## 2015-05-03 NOTE — Telephone Encounter (Signed)
Per staff message and POF I have scheduled appts. Advised scheduler of appts. JMW  

## 2015-05-05 ENCOUNTER — Telehealth: Payer: Self-pay | Admitting: Internal Medicine

## 2015-05-05 NOTE — Telephone Encounter (Signed)
returned call adn s.w pt and confirmed appts.

## 2015-05-09 DIAGNOSIS — J441 Chronic obstructive pulmonary disease with (acute) exacerbation: Secondary | ICD-10-CM | POA: Diagnosis not present

## 2015-05-15 ENCOUNTER — Other Ambulatory Visit: Payer: Self-pay | Admitting: Internal Medicine

## 2015-05-16 ENCOUNTER — Ambulatory Visit (HOSPITAL_BASED_OUTPATIENT_CLINIC_OR_DEPARTMENT_OTHER): Payer: Medicare Other | Admitting: Physician Assistant

## 2015-05-16 ENCOUNTER — Other Ambulatory Visit (HOSPITAL_BASED_OUTPATIENT_CLINIC_OR_DEPARTMENT_OTHER): Payer: Medicare Other

## 2015-05-16 ENCOUNTER — Encounter: Payer: Self-pay | Admitting: Physician Assistant

## 2015-05-16 ENCOUNTER — Ambulatory Visit (HOSPITAL_BASED_OUTPATIENT_CLINIC_OR_DEPARTMENT_OTHER): Payer: Medicare Other

## 2015-05-16 VITALS — BP 161/76 | HR 124 | Temp 98.6°F | Resp 17 | Ht 65.0 in | Wt 128.2 lb

## 2015-05-16 DIAGNOSIS — C3412 Malignant neoplasm of upper lobe, left bronchus or lung: Secondary | ICD-10-CM

## 2015-05-16 DIAGNOSIS — R0602 Shortness of breath: Secondary | ICD-10-CM | POA: Diagnosis not present

## 2015-05-16 DIAGNOSIS — Z5112 Encounter for antineoplastic immunotherapy: Secondary | ICD-10-CM

## 2015-05-16 DIAGNOSIS — M545 Low back pain: Secondary | ICD-10-CM | POA: Diagnosis not present

## 2015-05-16 DIAGNOSIS — G8929 Other chronic pain: Secondary | ICD-10-CM | POA: Diagnosis not present

## 2015-05-16 DIAGNOSIS — C3490 Malignant neoplasm of unspecified part of unspecified bronchus or lung: Secondary | ICD-10-CM

## 2015-05-16 DIAGNOSIS — R05 Cough: Secondary | ICD-10-CM

## 2015-05-16 DIAGNOSIS — C3411 Malignant neoplasm of upper lobe, right bronchus or lung: Secondary | ICD-10-CM

## 2015-05-16 DIAGNOSIS — J449 Chronic obstructive pulmonary disease, unspecified: Secondary | ICD-10-CM

## 2015-05-16 LAB — CBC WITH DIFFERENTIAL/PLATELET
BASO%: 0.4 % (ref 0.0–2.0)
BASOS ABS: 0 10*3/uL (ref 0.0–0.1)
EOS%: 9.4 % — ABNORMAL HIGH (ref 0.0–7.0)
Eosinophils Absolute: 0.5 10*3/uL (ref 0.0–0.5)
HEMATOCRIT: 36.8 % (ref 34.8–46.6)
HGB: 12.5 g/dL (ref 11.6–15.9)
LYMPH%: 28.8 % (ref 14.0–49.7)
MCH: 27.6 pg (ref 25.1–34.0)
MCHC: 34 g/dL (ref 31.5–36.0)
MCV: 81.2 fL (ref 79.5–101.0)
MONO#: 0.8 10*3/uL (ref 0.1–0.9)
MONO%: 15.6 % — ABNORMAL HIGH (ref 0.0–14.0)
NEUT#: 2.4 10*3/uL (ref 1.5–6.5)
NEUT%: 45.8 % (ref 38.4–76.8)
Platelets: 234 10*3/uL (ref 145–400)
RBC: 4.53 10*6/uL (ref 3.70–5.45)
RDW: 15 % — ABNORMAL HIGH (ref 11.2–14.5)
WBC: 5.2 10*3/uL (ref 3.9–10.3)
lymph#: 1.5 10*3/uL (ref 0.9–3.3)

## 2015-05-16 LAB — COMPREHENSIVE METABOLIC PANEL (CC13)
ALK PHOS: 66 U/L (ref 40–150)
ALT: 11 U/L (ref 0–55)
AST: 14 U/L (ref 5–34)
Albumin: 3.5 g/dL (ref 3.5–5.0)
Anion Gap: 9 mEq/L (ref 3–11)
BUN: 8.8 mg/dL (ref 7.0–26.0)
CALCIUM: 9.4 mg/dL (ref 8.4–10.4)
CO2: 29 meq/L (ref 22–29)
CREATININE: 0.9 mg/dL (ref 0.6–1.1)
Chloride: 105 mEq/L (ref 98–109)
EGFR: 70 mL/min/{1.73_m2} — AB (ref >90)
GLUCOSE: 89 mg/dL (ref 70–140)
POTASSIUM: 4.1 meq/L (ref 3.5–5.1)
SODIUM: 142 meq/L (ref 136–145)
TOTAL PROTEIN: 7.1 g/dL (ref 6.4–8.3)
Total Bilirubin: 0.75 mg/dL (ref 0.20–1.20)

## 2015-05-16 MED ORDER — HEPARIN SOD (PORK) LOCK FLUSH 100 UNIT/ML IV SOLN
500.0000 [IU] | Freq: Once | INTRAVENOUS | Status: AC | PRN
Start: 2015-05-16 — End: 2015-05-16
  Administered 2015-05-16: 500 [IU]
  Filled 2015-05-16: qty 5

## 2015-05-16 MED ORDER — NIVOLUMAB CHEMO INJECTION 100 MG/10ML
3.0000 mg/kg | Freq: Once | INTRAVENOUS | Status: AC
  Administered 2015-05-16: 180 mg via INTRAVENOUS
  Filled 2015-05-16: qty 18

## 2015-05-16 MED ORDER — SODIUM CHLORIDE 0.9 % IJ SOLN
10.0000 mL | INTRAMUSCULAR | Status: DC | PRN
  Administered 2015-05-16: 10 mL
  Filled 2015-05-16: qty 10

## 2015-05-16 MED ORDER — NIVOLUMAB CHEMO INJECTION 100 MG/10ML: 3.0000 mg/kg | Freq: Once | INTRAVENOUS | Status: DC

## 2015-05-16 MED ORDER — SODIUM CHLORIDE 0.9 % IV SOLN
Freq: Once | INTRAVENOUS | Status: AC
  Administered 2015-05-16: 14:00:00 via INTRAVENOUS

## 2015-05-16 NOTE — Progress Notes (Addendum)
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   PRIOR THERAPY:  1. Status post palliative radiotherapy to the left lung mass under the care of Dr. Pablo Ledger completed on 03/06/2012.  2. Systemic chemotherapy with carboplatin for AUC of 6 on day 1 and Abraxane 100 mg/M2 on days 1, 8 and 15 every 3 weeks. Status post 2 cycles. From cycle 3 forward AUC will be decreased to 4.5 given on day 1 and the Abraxane will be decreased to 90 mg per meter squared on days 1, 8 and 15 every 3 weeks, Status post a total of 3 cycles.  3. Systemic chemotherapy with carboplatin for AUC of 5 on day 1 and gemcitabine 1000 mg/m2 given on day 1 and day 8 every 3 weeks,status post 1 cycle. Due to significant neutropenia she will be dosed reduced beginning cycle 2 forward to carboplatin at an AUC of 4 given on day 1 and gemcitabine at 800 mg per meter squared given on days 1 and 8 every 3 weeks. Status post 6 cycles.  4. Status post palliative radiotherapy to the right hilum under the care of Dr. Pablo Ledger completed on 11/26/2013.  CURRENT THERAPY: Immunotherapy with Nivolumab 3 MG/KG every 2 weeks. First dose given 03/07/2015. Status post 5 cycles.  CHEMOTHERAPY INTENT: Palliative  CURRENT # OF CHEMOTHERAPY CYCLES: 6  CURRENT ANTIEMETICS: Compazine  CURRENT SMOKING STATUS: Former smoker  ORAL CHEMOTHERAPY AND CONSENT: None  CURRENT BISPHOSPHONATES USE: None  PAIN MANAGEMENT: 5/10 right shoulder currently on Norco  NARCOTICS INDUCED CONSTIPATION: None  LIVING WILL AND CODE STATUS: No CODE BLUE   INTERVAL HISTORY: Destiny Harrison 74 y.o. female returns to the clinic today for routine followup visit accompanied by her son. The patient is feeling fine today with no specific complaints  except for mild persistent chronic back pain. She takes Vicodin for pain management on as-needed basis.  She also continues to have a nonproductive cough however, this has improved. Overall she is tolerating her treatment with immunotherapy with Nivolumab relatively well.  She denied any changes to her baseline shortness of breath, denied skin rash or diarrhea. She denied having any significant weight loss or night sweats. She has no chest pain, but has shortness of breath increased with exertion and dry, cough with no hemoptysis. The patient denied having any significant fever or chills, nausea or vomiting.   MEDICAL HISTORY: Past Medical History  Diagnosis Date  . COPD (chronic obstructive pulmonary disease)   . Neutropenia, drug-induced 05/05/2012  . Hyperlipidemia   . Rheumatoid arthritis(714.0)   . Asthma   . Hiatal hernia   . History of chemotherapy   . History of radiation therapy 03/06/2012    left hilum  . History of radiation therapy 05/10/2013-05/31/2013    Left lung/ 33/75'@2'$ .25 per fraction x 15 fractions  . Radiation 11/15/13-11/26/13    Right hilum 30 Gy in 10 fractions  . Non-small cell lung cancer dx'd 08/28/11    left lung    ALLERGIES:  is allergic to shellfish allergy.  MEDICATIONS:  Current Outpatient Prescriptions  Medication Sig Dispense Refill  . albuterol (PROVENTIL) (2.5 MG/3ML) 0.083% nebulizer solution Take 2.5 mg by nebulization every 8 (eight) hours.    Marland Kitchen albuterol (VENTOLIN HFA) 108 (90 BASE) MCG/ACT inhaler Inhale 2 puffs into the lungs  every 3 (three) hours as needed for wheezing or shortness of breath. 1 Inhaler 6  . azithromycin (ZITHROMAX) 250 MG tablet Take 250 mg for 4 days 4 each 0  . calcium-vitamin D (OSCAL WITH D) 500-200 MG-UNIT per tablet Take 1 tablet by mouth daily.    . folic acid (FOLVITE) 824 MCG tablet Take 400 mcg by mouth daily.    Marland Kitchen HYDROcodone-acetaminophen (NORCO/VICODIN) 5-325 MG per tablet Take 1 tablet by mouth every 4 (four) hours as  needed for moderate pain. 30 tablet 0  . ipratropium (ATROVENT) 0.02 % nebulizer solution Take 0.5 mg by nebulization every 8 (eight) hours.    . ondansetron (ZOFRAN ODT) 8 MG disintegrating tablet Take 1 tablet (8 mg total) by mouth every 8 (eight) hours as needed for nausea or vomiting. 20 tablet 0  . predniSONE (DELTASONE) 5 MG tablet Take 5 mg by mouth daily.     Marland Kitchen PROAIR HFA 108 (90 BASE) MCG/ACT inhaler INHALE 2 PUFFS BY MOUTH EVERY 4 HOURS AS NEEDED FOR WHEEZING OR SHORTNESS OF BREATH 8.5 g 5  . prochlorperazine (COMPAZINE) 10 MG tablet Take 1 tablet (10 mg total) by mouth every 6 (six) hours as needed for nausea or vomiting. 30 tablet 0  . simvastatin (ZOCOR) 5 MG tablet Take 5 mg by mouth at bedtime.     No current facility-administered medications for this visit.    SURGICAL HISTORY:  Past Surgical History  Procedure Laterality Date  . Appendex  1962  . Video bronchoscopy  01/28/2012    Procedure: VIDEO BRONCHOSCOPY WITHOUT FLUORO;  Surgeon: Brand Males, MD;  Location: [facility] ENDOSCOPY;  Service: Endoscopy;  Laterality: Bilateral;  . Surgery on right wrist      REVIEW OF SYSTEMS:  Constitutional: positive for fatigue Eyes: negative Ears, nose, mouth, throat, and face: negative Respiratory: positive for cough, dyspnea on exertion, wheezing and All improved Cardiovascular: negative Gastrointestinal: negative Genitourinary:negative Integument/breast: negative Hematologic/lymphatic: negative Musculoskeletal:negative Neurological: negative Behavioral/Psych: negative Endocrine: negative Allergic/Immunologic: negative   PHYSICAL EXAMINATION: General appearance: alert, cooperative and no distress Head: Normocephalic, without obvious abnormality, atraumatic Neck: no adenopathy, no JVD, supple, symmetrical, trachea midline and thyroid not enlarged, symmetric, no tenderness/mass/nodules Lymph nodes: Cervical, supraclavicular, and axillary nodes normal. Resp: wheezes bilaterally  and Improved when compared to her examination 2 weeks ago Back: symmetric, no curvature. ROM normal. No CVA tenderness. Cardio: regular rate and rhythm, S1, S2 normal, no murmur, click, rub or gallop GI: soft, non-tender; bowel sounds normal; no masses,  no organomegaly Extremities: extremities normal, atraumatic, no cyanosis or edema Neurologic: Alert and oriented X 3, normal strength and tone. Normal symmetric reflexes. Normal coordination and gait  ECOG PERFORMANCE STATUS: 1 - Symptomatic but completely ambulatory  Blood pressure 161/76, pulse 124, temperature 98.6 F (37 C), temperature source Oral, resp. rate 17, height '5\' 5"'$  (1.651 m), weight 128 lb 3.2 oz (58.151 kg), SpO2 99 %.  LABORATORY DATA: Lab Results  Component Value Date   WBC 5.2 05/16/2015   HGB 12.5 05/16/2015   HCT 36.8 05/16/2015   MCV 81.2 05/16/2015   PLT 234 05/16/2015      Chemistry      Component Value Date/Time   NA 142 05/16/2015 1332   NA 140 04/21/2015 2316   K 4.1 05/16/2015 1332   K 4.0 04/21/2015 2316   CL 102 04/21/2015 2316   CL 101 01/12/2013 0810   CO2 29 05/16/2015 1332   CO2 27 04/21/2015 2316   BUN 8.8 05/16/2015 1332  BUN 11 04/21/2015 2316   CREATININE 0.9 05/16/2015 1332   CREATININE 0.89 04/21/2015 2316      Component Value Date/Time   CALCIUM 9.4 05/16/2015 1332   CALCIUM 9.3 04/21/2015 2316   ALKPHOS 66 05/16/2015 1332   ALKPHOS 75 06/24/2014 2111   AST 14 05/16/2015 1332   AST 14 06/24/2014 2111   ALT 11 05/16/2015 1332   ALT 7 06/24/2014 2111   BILITOT 0.75 05/16/2015 1332   BILITOT 0.4 06/24/2014 2111       RADIOGRAPHIC STUDIES: Ct Chest W Contrast  04/27/2015   CLINICAL DATA:  74 year old female with history of right-sided lung cancer diagnosed in 2012. Restaging examination. Shortness of breath. Recent hospitalization for respiratory failure.  EXAM: CT CHEST WITH CONTRAST  TECHNIQUE: Multidetector CT imaging of the chest was performed during intravenous contrast  administration.  CONTRAST:  47m OMNIPAQUE IOHEXOL 300 MG/ML  SOLN  COMPARISON:  Chest CT 02/09/2015.  FINDINGS: Mediastinum/Lymph Nodes: Previously noted right supraclavicular lymph node is larger than the prior examination measuring 11 mm in short axis on today's study (image 2 of series 2). Previously demonstrated mediastinal mass centered in the high right paratracheal station extending between the innominate artery and superior vena cava anteriorly is similar in size, measuring 3.7 x 2.7 cm on today's examination (Unchanged within measurement error when measured in a similar fashion on image 14 of series 2 of the prior examination). Subcarinal lymph node appears slightly larger than the prior examination, currently measuring 13 mm in short axis. Heart size is normal. Small amount of pericardial fluid and/or thickening overlying the left ventricular apex, unlikely to be of any hemodynamic significance at this time. No associated pericardial calcification. There is atherosclerosis of the thoracic aorta, the great vessels of the mediastinum and the coronary arteries, including calcified atherosclerotic plaque in the left main, left anterior descending and left circumflex coronary arteries. Esophagus is unremarkable in appearance. No axillary lymphadenopathy.  Lungs/Pleura: Previously described perihilar mass in the central aspect of the right middle lobe is slightly smaller than the prior examination, currently a large nodule measuring approximately 2.0 x 2.7 cm (image 27 of series 2). No other suspicious appearing pulmonary nodules or masses are noted. 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 Mild diffuse bronchial wall thickening with mild to moderate centrilobular and paraseptal emphysema. Bilateral apical pleuroparenchymal thickening, somewhat nodular, similar to prior studies, most  compatible with chronic post infectious or inflammatory scarring. No acute consolidative airspace disease. No pleural effusions.  Upper Abdomen: Calcified granulomas in the liver.  Musculoskeletal/Soft Tissues: Chronic compression fracture at T4 with 30% loss of anterior vertebral body height is unchanged compared to prior examination. There are no aggressive appearing lytic or blastic lesions noted in the visualized portions of the skeleton.  IMPRESSION: 1. Today's study demonstrates a mixed response to therapy. Specifically, the large perihilar right middle lobe nodule has regressed compared to the prior examination, but subcarinal and right supraclavicular lymph nodes appear larger than the prior study. Other larger mediastinal nodal masses are stable compared to the prior examination. 2. Mild diffuse bronchial wall thickening with mild to moderate centrilobular and paraseptal emphysema; imaging findings again suggestive of underlying COPD. 3. Stable postradiation changes in the left lung, similar to prior examinations, without evidence of local recurrence of disease. 4. Atherosclerosis, including left main and 2 vessel coronary artery disease. Assessment for potential risk factor modification, dietary therapy or pharmacologic  therapy may be warranted, if clinically indicated.   Electronically Signed   By: Vinnie Langton M.D.   On: 04/27/2015 10:16   Dg Chest Port 1 View  04/21/2015   CLINICAL DATA:  Acute onset of severe shortness of breath. Known lung cancer. Initial encounter.  EXAM: PORTABLE CHEST - 1 VIEW  COMPARISON:  CT of the chest performed 02/09/2015, and chest radiograph performed 06/24/2014  FINDINGS: The patient's right hilar mass is again noted, concerning for malignancy. Known mediastinal lymphadenopathy is not well characterized on radiograph, except at the right paratracheal region. No superimposed focal airspace consolidation is seen. No pleural effusion or pneumothorax is identified.  The  cardiomediastinal silhouette remains normal in size. A right-sided chest port is noted ending about the mid SVC. No acute osseous abnormalities identified.  IMPRESSION: Right hilar mass remains concerning for malignancy. Known mediastinal lymphadenopathy is not well characterized on radiograph, except for the large mass in the right paratracheal region. No superimposed focal airspace consolidation seen.   Electronically Signed   By: Garald Balding M.D.   On: 04/21/2015 23:31   ASSESSMENT AND PLAN: This is a very pleasant 74 year old African American female with metastatic non-small cell lung cancer, squamous cell carcinoma status post several chemotherapy regimen and and completed a course of palliative radiotherapy to the right hilum in February 2015.   Her recent CT scan of the chest, abdomen and pelvis showed evidence for disease progression with enlarging right hilar mass and progressive mediastinal lymphadenopathy as well as slight progression of bilateral supraclavicular lymph nodes.  She is currently receiving treatment with Nivolumab 3 MG/KG every 2 weeks. Status post 4 cycles. She is overall tolerating her treatment well. Patient was discussed with and also seen by Dr. Julien Nordmann. Her recent restaging CT scan showed overall stable disease. There is slight increase and a couple of areas that we will monitor closely on her next restaging CT scans. She will proceed with cycle #6 of her immunotherapy with  Nivolumab today as scheduled. She'll follow-up in 2 weeks prior to cycle #7.  She was given a refill prescription for her Vicodin tablets.  She was advised to call immediately if she has any concerning symptoms in the interval.  The patient voices understanding of current disease status and treatment options and is in agreement with the current care plan.  All questions were answered. The patient knows to call the clinic with any problems, questions or concerns. We can certainly see the patient much  sooner if necessary.  Carlton Adam, PA-C 05/16/2015  ADDENDUM: Hematology/Oncology Attending: I had a face to face encounter with the patient. I recommended her care plan. This is a very pleasant 74 years old African-American female with recurrent non-small cell lung cancer, squamous cell carcinoma. She is currently undergoing treatment with immunotherapy with Nivolumab status post 5 cycles. The patient is tolerating her treatment well with no significant adverse effects. I recommended for her to proceed with cycle #6 today as scheduled. The patient would come back for follow-up visit in 2 weeks for reevaluation before starting cycle #7. She was given a refill of her pain medication today.  She was advised to call immediately if she has any concerning symptoms in the interval.  Disclaimer: This note was dictated with voice recognition software. Similar sounding words can inadvertently be transcribed and may be missed upon review. Destiny Harrison., MD 05/22/2015

## 2015-05-16 NOTE — Patient Instructions (Signed)
[facility] Discharge Instructions for Patients Receiving Chemotherapy  Today you received the following chemotherapy agents Nivolumab.  To help prevent nausea and vomiting after your treatment, we encourage you to take your nausea medication as prescribed.   If you develop nausea and vomiting that is not controlled by your nausea medication, call the clinic.   BELOW ARE SYMPTOMS THAT SHOULD BE REPORTED IMMEDIATELY:  *FEVER GREATER THAN 100.5 F  *CHILLS WITH OR WITHOUT FEVER  NAUSEA AND VOMITING THAT IS NOT CONTROLLED WITH YOUR NAUSEA MEDICATION  *UNUSUAL SHORTNESS OF BREATH  *UNUSUAL BRUISING OR BLEEDING  TENDERNESS IN MOUTH AND THROAT WITH OR WITHOUT PRESENCE OF ULCERS  *URINARY PROBLEMS  *BOWEL PROBLEMS  UNUSUAL RASH Items with * indicate a potential emergency and should be followed up as soon as possible.  Feel free to call the clinic you have any questions or concerns. The clinic phone number is [phone].  Please show the CHEMO ALERT CARD at check-in to the Emergency Department and triage nurse.   

## 2015-05-19 NOTE — Patient Instructions (Signed)
Follow up in 2 weeks, prior to your next scheduled cycle of immunotherapy 

## 2015-05-22 ENCOUNTER — Encounter: Payer: Self-pay | Admitting: Physician Assistant

## 2015-05-22 DIAGNOSIS — Z5112 Encounter for antineoplastic immunotherapy: Secondary | ICD-10-CM

## 2015-05-22 HISTORY — DX: Encounter for antineoplastic immunotherapy: Z51.12

## 2015-05-25 ENCOUNTER — Emergency Department (HOSPITAL_COMMUNITY)
Admission: EM | Admit: 2015-05-25 | Discharge: 2015-05-25 | Disposition: A | Discharge status: Home / Self Care | Payer: Medicare Other | Attending: Emergency Medicine | Admitting: Emergency Medicine

## 2015-05-25 ENCOUNTER — Emergency Department (HOSPITAL_COMMUNITY): Payer: Medicare Other

## 2015-05-25 ENCOUNTER — Encounter (HOSPITAL_COMMUNITY): Payer: Self-pay | Admitting: Emergency Medicine

## 2015-05-25 DIAGNOSIS — Z87891 Personal history of nicotine dependence: Secondary | ICD-10-CM | POA: Diagnosis not present

## 2015-05-25 DIAGNOSIS — Z8719 Personal history of other diseases of the digestive system: Secondary | ICD-10-CM | POA: Insufficient documentation

## 2015-05-25 DIAGNOSIS — R05 Cough: Secondary | ICD-10-CM | POA: Diagnosis not present

## 2015-05-25 DIAGNOSIS — Z79899 Other long term (current) drug therapy: Secondary | ICD-10-CM | POA: Insufficient documentation

## 2015-05-25 DIAGNOSIS — Z7952 Long term (current) use of systemic steroids: Secondary | ICD-10-CM | POA: Insufficient documentation

## 2015-05-25 DIAGNOSIS — R0682 Tachypnea, not elsewhere classified: Secondary | ICD-10-CM | POA: Diagnosis not present

## 2015-05-25 DIAGNOSIS — Z862 Personal history of diseases of the blood and blood-forming organs and certain disorders involving the immune mechanism: Secondary | ICD-10-CM | POA: Insufficient documentation

## 2015-05-25 DIAGNOSIS — J441 Chronic obstructive pulmonary disease with (acute) exacerbation: Secondary | ICD-10-CM | POA: Diagnosis not present

## 2015-05-25 DIAGNOSIS — M069 Rheumatoid arthritis, unspecified: Secondary | ICD-10-CM | POA: Insufficient documentation

## 2015-05-25 DIAGNOSIS — E785 Hyperlipidemia, unspecified: Secondary | ICD-10-CM | POA: Diagnosis not present

## 2015-05-25 DIAGNOSIS — R0602 Shortness of breath: Secondary | ICD-10-CM | POA: Diagnosis not present

## 2015-05-25 DIAGNOSIS — Z85118 Personal history of other malignant neoplasm of bronchus and lung: Secondary | ICD-10-CM | POA: Diagnosis not present

## 2015-05-25 LAB — CBC WITH DIFFERENTIAL/PLATELET
Basophils Absolute: 0 10*3/uL (ref 0.0–0.1)
Basophils Relative: 0 % (ref 0–1)
EOS PCT: 9 % — AB (ref 0–5)
Eosinophils Absolute: 0.5 10*3/uL (ref 0.0–0.7)
HCT: 38.5 % (ref 36.0–46.0)
Hemoglobin: 12.7 g/dL (ref 12.0–15.0)
Lymphocytes Relative: 22 % (ref 12–46)
Lymphs Abs: 1.2 10*3/uL (ref 0.7–4.0)
MCH: 27.3 pg (ref 26.0–34.0)
MCHC: 33 g/dL (ref 30.0–36.0)
MCV: 82.6 fL (ref 78.0–100.0)
MONO ABS: 0.6 10*3/uL (ref 0.1–1.0)
Monocytes Relative: 11 % (ref 3–12)
NEUTROS ABS: 3.1 10*3/uL (ref 1.7–7.7)
Neutrophils Relative %: 58 % (ref 43–77)
PLATELETS: 281 10*3/uL (ref 150–400)
RBC: 4.66 MIL/uL (ref 3.87–5.11)
RDW: 14.8 % (ref 11.5–15.5)
WBC: 5.3 10*3/uL (ref 4.0–10.5)

## 2015-05-25 LAB — BASIC METABOLIC PANEL
Anion gap: 8 (ref 5–15)
BUN: 10 mg/dL (ref 6–20)
CALCIUM: 9.7 mg/dL (ref 8.9–10.3)
CO2: 29 mmol/L (ref 22–32)
Chloride: 102 mmol/L (ref 101–111)
Creatinine, Ser: 0.89 mg/dL (ref 0.44–1.00)
GFR calc Af Amer: >60 mL/min (ref >60)
GFR calc non Af Amer: >60 mL/min (ref >60)
Glucose, Bld: 130 mg/dL — ABNORMAL HIGH (ref 65–99)
Potassium: 3.9 mmol/L (ref 3.5–5.1)
Sodium: 139 mmol/L (ref 135–145)

## 2015-05-25 LAB — TROPONIN I: Troponin I: <0.03 ng/mL (ref <0.031)

## 2015-05-25 LAB — I-STAT TROPONIN, ED: Troponin i, poc: 0 ng/mL (ref 0.00–0.08)

## 2015-05-25 MED ORDER — GUAIFENESIN ER 600 MG PO TB12: 600.0000 mg | ORAL_TABLET | Freq: Two times a day (BID) | ORAL | Status: DC

## 2015-05-25 MED ORDER — ALBUTEROL SULFATE (2.5 MG/3ML) 0.083% IN NEBU
5.0000 mg | INHALATION_SOLUTION | Freq: Once | RESPIRATORY_TRACT | Status: AC
  Administered 2015-05-25: 5 mg via RESPIRATORY_TRACT

## 2015-05-25 MED ORDER — METHYLPREDNISOLONE SODIUM SUCC 125 MG IJ SOLR
125.0000 mg | Freq: Once | INTRAMUSCULAR | Status: AC
  Administered 2015-05-25: 125 mg via INTRAVENOUS
  Filled 2015-05-25: qty 2

## 2015-05-25 MED ORDER — ALBUTEROL (5 MG/ML) CONTINUOUS INHALATION SOLN
10.0000 mg/h | INHALATION_SOLUTION | Freq: Once | RESPIRATORY_TRACT | Status: AC
  Administered 2015-05-25: 10 mg/h via RESPIRATORY_TRACT
  Filled 2015-05-25: qty 20

## 2015-05-25 MED ORDER — BENZONATATE 100 MG PO CAPS: 100.0000 mg | ORAL_CAPSULE | Freq: Three times a day (TID) | ORAL | Status: DC

## 2015-05-25 MED ORDER — ALBUTEROL SULFATE (2.5 MG/3ML) 0.083% IN NEBU
INHALATION_SOLUTION | RESPIRATORY_TRACT | Status: AC
  Filled 2015-05-25: qty 6

## 2015-05-25 MED ORDER — HEPARIN SOD (PORK) LOCK FLUSH 100 UNIT/ML IV SOLN
500.0000 [IU] | Freq: Once | INTRAVENOUS | Status: AC
  Administered 2015-05-25: 500 [IU]
  Filled 2015-05-25: qty 5

## 2015-05-25 MED ORDER — IPRATROPIUM-ALBUTEROL 0.5-2.5 (3) MG/3ML IN SOLN
RESPIRATORY_TRACT | Status: AC
  Administered 2015-05-25: 3 mL
  Filled 2015-05-25: qty 3

## 2015-05-25 MED ORDER — DOXYCYCLINE HYCLATE 100 MG PO CAPS: 100.0000 mg | ORAL_CAPSULE | Freq: Two times a day (BID) | ORAL | Status: DC

## 2015-05-25 MED ORDER — PREDNISONE 20 MG PO TABS: 20.0000 mg | ORAL_TABLET | Freq: Two times a day (BID) | ORAL | Status: DC

## 2015-05-25 NOTE — ED Notes (Signed)
Patient here from home with c/o of SOB. Does not wear O2 at home. Reports doing a breathing treatment with no relief.

## 2015-05-25 NOTE — Discharge Instructions (Signed)

## 2015-05-25 NOTE — ED Notes (Signed)
Nurse is obtaining labs with [facility] access

## 2015-05-25 NOTE — ED Notes (Signed)
Pt ambulated well without any assistance. Pt o2 level was between 92 and 93 and pulse rate was 133.

## 2015-05-25 NOTE — ED Provider Notes (Signed)
CSN: [id number]     Arrival date & time 05/25/15  1800 History   First MD Initiated Contact with Patient 05/25/15 1814     Chief Complaint  Patient presents with  . Shortness of Breath      HPI  Patient presents for evaluation for shortness of breath. History of COPD. History of lung cancer. Uses nebulizers at home regularly. More short of breath for the last 2 days. Apparently has little reported air-conditioning in her home and the humidity "really gets to me". More short of breath for the last 24 hours she presents here.  Denies fever. Denies chest pain. No change in quality or quantity of sputum. No URI symptoms. No abdominal pain no extremity pain or swelling.  Past Medical History  Diagnosis Date  . COPD (chronic obstructive pulmonary disease)   . Neutropenia, drug-induced 05/05/2012  . Hyperlipidemia   . Rheumatoid arthritis(714.0)   . Asthma   . Hiatal hernia   . History of chemotherapy   . History of radiation therapy 03/06/2012    left hilum  . History of radiation therapy 05/10/2013-05/31/2013    Left lung/ 33/75'@2'$ .25 per fraction x 15 fractions  . Radiation 11/15/13-11/26/13    Right hilum 30 Gy in 10 fractions  . Non-small cell lung cancer dx'd 08/28/11    left lung  . Encounter for antineoplastic immunotherapy 05/22/2015   Past Surgical History  Procedure Laterality Date  . Appendex  1962  . Video bronchoscopy  01/28/2012    Procedure: VIDEO BRONCHOSCOPY WITHOUT FLUORO;  Surgeon: Brand Males, MD;  Location: [facility] ENDOSCOPY;  Service: Endoscopy;  Laterality: Bilateral;  . Surgery on right wrist     Family History  Problem Relation Age of Onset  . Diabetes Mother     insulin dependent  . Breast cancer Mother    History  Substance Use Topics  . Smoking status: Former Smoker -- 0.50 packs/day for 40 years    Types: Cigarettes    Quit date: 10/29/2009  . Smokeless tobacco: Never Used  . Alcohol Use: No   OB History    No data available     Review of Systems   Constitutional: Negative for fever, chills, diaphoresis, appetite change and fatigue.  HENT: Negative for mouth sores, sore throat and trouble swallowing.   Eyes: Negative for visual disturbance.  Respiratory: Positive for cough, shortness of breath and wheezing. Negative for chest tightness.   Cardiovascular: Negative for chest pain.  Gastrointestinal: Negative for nausea, vomiting, abdominal pain, diarrhea and abdominal distention.  Endocrine: Negative for polydipsia, polyphagia and polyuria.  Genitourinary: Negative for dysuria, frequency and hematuria.  Musculoskeletal: Negative for gait problem.  Skin: Negative for color change, pallor and rash.  Neurological: Negative for dizziness, syncope, light-headedness and headaches.  Hematological: Does not bruise/bleed easily.  Psychiatric/Behavioral: Negative for behavioral problems and confusion.      Allergies  Shellfish allergy  Home Medications   Prior to Admission medications   Medication Sig Start Date End Date Taking? Authorizing Provider  albuterol (PROVENTIL) (2.5 MG/3ML) 0.083% nebulizer solution Take 2.5 mg by nebulization every 8 (eight) hours.   Yes Historical Provider, MD  albuterol (VENTOLIN HFA) 108 (90 BASE) MCG/ACT inhaler Inhale 2 puffs into the lungs every 3 (three) hours as needed for wheezing or shortness of breath. 02/22/14  Yes Brand Males, MD  calcium-vitamin D (OSCAL WITH D) 500-200 MG-UNIT per tablet Take 1 tablet by mouth daily.   Yes Historical Provider, MD  folic acid (FOLVITE) 998 MCG  tablet Take 400 mcg by mouth daily.   Yes Historical Provider, MD  guaifenesin (ROBITUSSIN) 100 MG/5ML syrup Take 200 mg by mouth 3 (three) times daily as needed for cough.   Yes Historical Provider, MD  HYDROcodone-acetaminophen (NORCO/VICODIN) 5-325 MG per tablet Take 1 tablet by mouth every 4 (four) hours as needed for moderate pain. 05/02/15  Yes Adrena E Johnson, PA-C  ipratropium (ATROVENT) 0.02 % nebulizer solution  Take 0.5 mg by nebulization every 8 (eight) hours.   Yes Historical Provider, MD  predniSONE (DELTASONE) 5 MG tablet Take 5 mg by mouth daily.    Yes Historical Provider, MD  simvastatin (ZOCOR) 5 MG tablet Take 5 mg by mouth at bedtime.   Yes Historical Provider, MD  azithromycin (ZITHROMAX) 250 MG tablet Take 250 mg for 4 days Patient not taking: Reported on 05/25/2015 04/23/15   Robbie Lis, MD  ondansetron (ZOFRAN ODT) 8 MG disintegrating tablet Take 1 tablet (8 mg total) by mouth every 8 (eight) hours as needed for nausea or vomiting. 04/04/15   Maryanna Shape, NP  PROAIR HFA 108 (90 BASE) MCG/ACT inhaler INHALE 2 PUFFS BY MOUTH EVERY 4 HOURS AS NEEDED FOR WHEEZING OR SHORTNESS OF BREATH Patient not taking: Reported on 05/25/2015 04/21/15   Brand Males, MD  prochlorperazine (COMPAZINE) 10 MG tablet Take 1 tablet (10 mg total) by mouth every 6 (six) hours as needed for nausea or vomiting. 03/21/15   Adrena E Johnson, PA-C   BP 165/131 mmHg  Pulse 133  Temp(Src) 98.6 F (37 C) (Oral)  Resp 29  SpO2 100% Physical Exam  Constitutional: She is oriented to person, place, and time. She appears well-developed and well-nourished. No distress.  HENT:  Head: Normocephalic.  Eyes: Conjunctivae are normal. Pupils are equal, round, and reactive to light. No scleral icterus.  Neck: Normal range of motion. Neck supple. No thyromegaly present.  Cardiovascular: Normal rate and regular rhythm.  Exam reveals no gallop and no friction rub.   No murmur heard. Pulmonary/Chest: Accessory muscle usage present. No respiratory distress. She has decreased breath sounds. She has wheezes. She has no rales.  Tachypneic. Wheezing and prolongation in all fields. Globally diminished breath sounds.  Abdominal: Soft. Bowel sounds are normal. She exhibits no distension. There is no tenderness. There is no rebound.  Musculoskeletal: Normal range of motion.  Neurological: She is alert and oriented to person, place, and  time.  Skin: Skin is warm and dry. No rash noted.  Psychiatric: She has a normal mood and affect. Her behavior is normal.    ED Course  Procedures (including critical care time) Labs Review Labs Reviewed  CBC WITH DIFFERENTIAL/PLATELET - Abnormal; Notable for the following:    Eosinophils Relative 9 (*)    All other components within normal limits  BASIC METABOLIC PANEL - Abnormal; Notable for the following:    Glucose, Bld 130 (*)    All other components within normal limits  TROPONIN I  I-STAT TROPOININ, ED    Imaging Review Dg Chest 2 View  05/25/2015   CLINICAL DATA:  Shortness of breath and severe cough. History of lung cancer.  EXAM: CHEST  2 VIEW  COMPARISON:  Single view of the chest 04/21/2015 and CT chest 04/27/2015.  FINDINGS: The lungs are markedly emphysematous with extensive bilateral scarring. Right hilar nodule is again seen. Scarring along the aortic arch compatible with prior radiation therapy is also identified. No consolidative process, pneumothorax or effusion is identified.  IMPRESSION: No acute disease.  Right hilar nodule compatible with carcinoma. Post treatment change left lung also noted.  Emphysema.   Electronically Signed   By: Inge Rise M.D.   On: 05/25/2015 19:08     EKG Interpretation None      MDM   Final diagnoses:  COPD exacerbation    Patient given 1 hour continuous nebulized albuterol. Given IV Solu-Medrol. Halfway through this treatment at 30-45 minutes I reexamined. She appears much more relaxed presents she feels "much much better". After completion I reexamined her. She has minimal end expiratory wheezing. States she feels at her baseline. Her family states that they feel she looks at her baseline. She is 96% on room air. She does not have fever, she has normal wbc's. Not neutropenic. No new abnormality on chest x-ray. Afebrile. She is ambulating now. She continues to feel well plan will be discharged home in treatment for COPD  exacerbation sterilely's, antibiotics, mucolytics, primary care follow-up.    Tanna Furry, MD 05/25/15 2204

## 2015-05-30 ENCOUNTER — Other Ambulatory Visit (HOSPITAL_BASED_OUTPATIENT_CLINIC_OR_DEPARTMENT_OTHER): Payer: Medicare Other

## 2015-05-30 ENCOUNTER — Ambulatory Visit (HOSPITAL_BASED_OUTPATIENT_CLINIC_OR_DEPARTMENT_OTHER): Payer: Medicare Other

## 2015-05-30 ENCOUNTER — Telehealth: Payer: Self-pay | Admitting: *Deleted

## 2015-05-30 ENCOUNTER — Telehealth: Payer: Self-pay | Admitting: Internal Medicine

## 2015-05-30 ENCOUNTER — Ambulatory Visit (HOSPITAL_BASED_OUTPATIENT_CLINIC_OR_DEPARTMENT_OTHER): Payer: Medicare Other | Admitting: Internal Medicine

## 2015-05-30 ENCOUNTER — Encounter: Payer: Self-pay | Admitting: Internal Medicine

## 2015-05-30 VITALS — BP 168/70 | HR 109 | Temp 98.8°F | Resp 18 | Ht 65.0 in | Wt 128.7 lb

## 2015-05-30 DIAGNOSIS — C3412 Malignant neoplasm of upper lobe, left bronchus or lung: Secondary | ICD-10-CM

## 2015-05-30 DIAGNOSIS — R0609 Other forms of dyspnea: Secondary | ICD-10-CM | POA: Diagnosis not present

## 2015-05-30 DIAGNOSIS — C3490 Malignant neoplasm of unspecified part of unspecified bronchus or lung: Secondary | ICD-10-CM

## 2015-05-30 DIAGNOSIS — Z79899 Other long term (current) drug therapy: Secondary | ICD-10-CM

## 2015-05-30 DIAGNOSIS — R5382 Chronic fatigue, unspecified: Secondary | ICD-10-CM

## 2015-05-30 DIAGNOSIS — R0600 Dyspnea, unspecified: Secondary | ICD-10-CM

## 2015-05-30 DIAGNOSIS — C3411 Malignant neoplasm of upper lobe, right bronchus or lung: Secondary | ICD-10-CM

## 2015-05-30 DIAGNOSIS — R05 Cough: Secondary | ICD-10-CM

## 2015-05-30 DIAGNOSIS — Z5112 Encounter for antineoplastic immunotherapy: Secondary | ICD-10-CM | POA: Diagnosis present

## 2015-05-30 LAB — CBC WITH DIFFERENTIAL/PLATELET
BASO%: 0.6 % (ref 0.0–2.0)
BASOS ABS: 0.1 10*3/uL (ref 0.0–0.1)
EOS%: 0.2 % (ref 0.0–7.0)
Eosinophils Absolute: 0 10*3/uL (ref 0.0–0.5)
HCT: 39.4 % (ref 34.8–46.6)
HGB: 13 g/dL (ref 11.6–15.9)
LYMPH%: 14.8 % (ref 14.0–49.7)
MCH: 27.5 pg (ref 25.1–34.0)
MCHC: 33 g/dL (ref 31.5–36.0)
MCV: 83.3 fL (ref 79.5–101.0)
MONO#: 0.9 10*3/uL (ref 0.1–0.9)
MONO%: 10 % (ref 0.0–14.0)
NEUT%: 74.4 % (ref 38.4–76.8)
NEUTROS ABS: 6.6 10*3/uL — AB (ref 1.5–6.5)
Platelets: 272 10*3/uL (ref 145–400)
RBC: 4.73 10*6/uL (ref 3.70–5.45)
RDW: 16.6 % — AB (ref 11.2–14.5)
WBC: 8.9 10*3/uL (ref 3.9–10.3)
lymph#: 1.3 10*3/uL (ref 0.9–3.3)

## 2015-05-30 LAB — COMPREHENSIVE METABOLIC PANEL (CC13)
ALK PHOS: 69 U/L (ref 40–150)
ALT: 17 U/L (ref 0–55)
AST: 13 U/L (ref 5–34)
Albumin: 3.6 g/dL (ref 3.5–5.0)
Anion Gap: 8 mEq/L (ref 3–11)
BUN: 20.3 mg/dL (ref 7.0–26.0)
CO2: 29 mEq/L (ref 22–29)
Calcium: 9.3 mg/dL (ref 8.4–10.4)
Chloride: 104 mEq/L (ref 98–109)
Creatinine: 0.9 mg/dL (ref 0.6–1.1)
EGFR: 70 mL/min/{1.73_m2} — ABNORMAL LOW (ref >90)
Glucose: 92 mg/dl (ref 70–140)
Potassium: 3.8 mEq/L (ref 3.5–5.1)
SODIUM: 140 meq/L (ref 136–145)
Total Bilirubin: 0.54 mg/dL (ref 0.20–1.20)
Total Protein: 6.8 g/dL (ref 6.4–8.3)

## 2015-05-30 LAB — TSH CHCC: TSH: 0.629 m(IU)/L (ref 0.308–3.960)

## 2015-05-30 MED ORDER — SODIUM CHLORIDE 0.9 % IV SOLN
2.8000 mg/kg | Freq: Once | INTRAVENOUS | Status: AC
  Administered 2015-05-30: 180 mg via INTRAVENOUS
  Filled 2015-05-30: qty 18

## 2015-05-30 MED ORDER — SODIUM CHLORIDE 0.9 % IV SOLN
Freq: Once | INTRAVENOUS | Status: AC
  Administered 2015-05-30: 10:00:00 via INTRAVENOUS

## 2015-05-30 MED ORDER — HEPARIN SOD (PORK) LOCK FLUSH 100 UNIT/ML IV SOLN
500.0000 [IU] | Freq: Once | INTRAVENOUS | Status: AC | PRN
  Administered 2015-05-30: 500 [IU]
  Filled 2015-05-30: qty 5

## 2015-05-30 MED ORDER — SODIUM CHLORIDE 0.9 % IJ SOLN
10.0000 mL | INTRAMUSCULAR | Status: DC | PRN
  Administered 2015-05-30: 10 mL
  Filled 2015-05-30: qty 10

## 2015-05-30 NOTE — Progress Notes (Signed)
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   PRIOR THERAPY:  1. Status post palliative radiotherapy to the left lung mass under the care of Dr. Pablo Ledger completed on 03/06/2012.  2. Systemic chemotherapy with carboplatin for AUC of 6 on day 1 and Abraxane 100 mg/M2 on days 1, 8 and 15 every 3 weeks. Status post 2 cycles. From cycle 3 forward AUC will be decreased to 4.5 given on day 1 and the Abraxane will be decreased to 90 mg per meter squared on days 1, 8 and 15 every 3 weeks, Status post a total of 3 cycles.  3. Systemic chemotherapy with carboplatin for AUC of 5 on day 1 and gemcitabine 1000 mg/m2 given on day 1 and day 8 every 3 weeks,status post 1 cycle. Due to significant neutropenia she will be dosed reduced beginning cycle 2 forward to carboplatin at an AUC of 4 given on day 1 and gemcitabine at 800 mg per meter squared given on days 1 and 8 every 3 weeks. Status post 6 cycles.  4. Status post palliative radiotherapy to the right hilum under the care of Dr. Pablo Ledger completed on 11/26/2013.  CURRENT THERAPY: Immunotherapy with Nivolumab 3 MG/KG every 2 weeks. First dose expected 03/07/2015. Status post 6 cycles.  CHEMOTHERAPY INTENT: Palliative  CURRENT # OF CHEMOTHERAPY CYCLES: 7  CURRENT ANTIEMETICS: Compazine  CURRENT SMOKING STATUS: Former smoker  ORAL CHEMOTHERAPY AND CONSENT: None  CURRENT BISPHOSPHONATES USE: None  PAIN MANAGEMENT: 5/10 right shoulder currently on Norco  NARCOTICS INDUCED CONSTIPATION: None  LIVING WILL AND CODE STATUS: No CODE BLUE   INTERVAL HISTORY: Destiny Harrison 74 y.o. female returns to the clinic today for followup visit accompanied by her son. The patient is currently on immunotherapy with Nivolumab every 2  weeks and tolerating it fairly well. She was seen recently at the emergency department for evaluation of shortness breath which significantly improved after she received breathing treatment. She continues to have mild wheezes. She denied having any significant weight loss or night sweats. She has no chest pain, but has shortness of breath increased with exertion and dry, cough with no hemoptysis. The patient denied having any significant fever or chills, nausea or vomiting. She is here today to start cycle #7 of her immunotherapy.  MEDICAL HISTORY: Past Medical History  Diagnosis Date  . COPD (chronic obstructive pulmonary disease)   . Neutropenia, drug-induced 05/05/2012  . Hyperlipidemia   . Rheumatoid arthritis(714.0)   . Asthma   . Hiatal hernia   . History of chemotherapy   . History of radiation therapy 03/06/2012    left hilum  . History of radiation therapy 05/10/2013-05/31/2013    Left lung/ 33/75'@2'$ .25 per fraction x 15 fractions  . Radiation 11/15/13-11/26/13    Right hilum 30 Gy in 10 fractions  . Non-small cell lung cancer dx'd 08/28/11    left lung  . Encounter for antineoplastic immunotherapy 05/22/2015    ALLERGIES:  is allergic to shellfish allergy.  MEDICATIONS:  Current Outpatient Prescriptions  Medication Sig Dispense Refill  . albuterol (PROVENTIL) (2.5 MG/3ML) 0.083% nebulizer solution Take 2.5 mg by nebulization every 8 (eight) hours.    Marland Kitchen albuterol (VENTOLIN HFA) 108 (90 BASE) MCG/ACT inhaler Inhale 2 puffs into the lungs every 3 (three) hours as needed for wheezing  or shortness of breath. 1 Inhaler 6  . azithromycin (ZITHROMAX) 250 MG tablet Take 250 mg for 4 days 4 each 0  . benzonatate (TESSALON) 100 MG capsule Take 1 capsule (100 mg total) by mouth every 8 (eight) hours. 21 capsule 0  . calcium-vitamin D (OSCAL WITH D) 500-200 MG-UNIT per tablet Take 1 tablet by mouth daily.    Marland Kitchen doxycycline (VIBRAMYCIN) 100 MG capsule Take 1 capsule (100 mg total) by mouth 2 (two)  times daily. 20 capsule 0  . folic acid (FOLVITE) 160 MCG tablet Take 400 mcg by mouth daily.    Marland Kitchen guaiFENesin (MUCINEX) 600 MG 12 hr tablet Take 1 tablet (600 mg total) by mouth 2 (two) times daily. 30 tablet 1  . HYDROcodone-acetaminophen (NORCO/VICODIN) 5-325 MG per tablet Take 1 tablet by mouth every 4 (four) hours as needed for moderate pain. 30 tablet 0  . ipratropium (ATROVENT) 0.02 % nebulizer solution Take 0.5 mg by nebulization every 8 (eight) hours.    . ondansetron (ZOFRAN ODT) 8 MG disintegrating tablet Take 1 tablet (8 mg total) by mouth every 8 (eight) hours as needed for nausea or vomiting. 20 tablet 0  . predniSONE (DELTASONE) 20 MG tablet Take 1 tablet (20 mg total) by mouth 2 (two) times daily with a meal. 10 tablet 0  . PROAIR HFA 108 (90 BASE) MCG/ACT inhaler INHALE 2 PUFFS BY MOUTH EVERY 4 HOURS AS NEEDED FOR WHEEZING OR SHORTNESS OF BREATH 8.5 g 5  . prochlorperazine (COMPAZINE) 10 MG tablet Take 1 tablet (10 mg total) by mouth every 6 (six) hours as needed for nausea or vomiting. 30 tablet 0  . simvastatin (ZOCOR) 5 MG tablet Take 5 mg by mouth at bedtime.     No current facility-administered medications for this visit.    SURGICAL HISTORY:  Past Surgical History  Procedure Laterality Date  . Appendex  1962  . Video bronchoscopy  01/28/2012    Procedure: VIDEO BRONCHOSCOPY WITHOUT FLUORO;  Surgeon: Brand Males, MD;  Location: [facility] ENDOSCOPY;  Service: Endoscopy;  Laterality: Bilateral;  . Surgery on right wrist      REVIEW OF SYSTEMS:  A comprehensive review of systems was negative except for: Respiratory: positive for dyspnea on exertion and wheezing   PHYSICAL EXAMINATION: General appearance: alert, cooperative and no distress Head: Normocephalic, without obvious abnormality, atraumatic Neck: no adenopathy, no JVD, supple, symmetrical, trachea midline and thyroid not enlarged, symmetric, no tenderness/mass/nodules Lymph nodes: Cervical, supraclavicular, and  axillary nodes normal. Resp: clear to auscultation bilaterally Back: symmetric, no curvature. ROM normal. No CVA tenderness. Cardio: regular rate and rhythm, S1, S2 normal, no murmur, click, rub or gallop GI: soft, non-tender; bowel sounds normal; no masses,  no organomegaly Extremities: extremities normal, atraumatic, no cyanosis or edema Neurologic: Alert and oriented X 3, normal strength and tone. Normal symmetric reflexes. Normal coordination and gait  ECOG PERFORMANCE STATUS: 1 - Symptomatic but completely ambulatory  Blood pressure 168/70, pulse 109, temperature 98.8 F (37.1 C), temperature source Oral, resp. rate 18, height '5\' 5"'$  (1.651 m), weight 128 lb 11.2 oz (58.378 kg), SpO2 95 %.  LABORATORY DATA: Lab Results  Component Value Date   WBC 8.9 05/30/2015   HGB 13.0 05/30/2015   HCT 39.4 05/30/2015   MCV 83.3 05/30/2015   PLT 272 05/30/2015      Chemistry      Component Value Date/Time   NA 140 05/30/2015 0814   NA 139 05/25/2015 1942   K 3.8 05/30/2015 1093  K 3.9 05/25/2015 1942   CL 102 05/25/2015 1942   CL 101 01/12/2013 0810   CO2 29 05/30/2015 0814   CO2 29 05/25/2015 1942   BUN 20.3 05/30/2015 0814   BUN 10 05/25/2015 1942   CREATININE 0.9 05/30/2015 0814   CREATININE 0.89 05/25/2015 1942      Component Value Date/Time   CALCIUM 9.3 05/30/2015 0814   CALCIUM 9.7 05/25/2015 1942   ALKPHOS 69 05/30/2015 0814   ALKPHOS 75 06/24/2014 2111   AST 13 05/30/2015 0814   AST 14 06/24/2014 2111   ALT 17 05/30/2015 0814   ALT 7 06/24/2014 2111   BILITOT 0.54 05/30/2015 0814   BILITOT 0.4 06/24/2014 2111       RADIOGRAPHIC STUDIES: Dg Chest 2 View  05/25/2015   CLINICAL DATA:  Shortness of breath and severe cough. History of lung cancer.  EXAM: CHEST  2 VIEW  COMPARISON:  Single view of the chest 04/21/2015 and CT chest 04/27/2015.  FINDINGS: The lungs are markedly emphysematous with extensive bilateral scarring. Right hilar nodule is again seen. Scarring  along the aortic arch compatible with prior radiation therapy is also identified. No consolidative process, pneumothorax or effusion is identified.  IMPRESSION: No acute disease.  Right hilar nodule compatible with carcinoma. Post treatment change left lung also noted.  Emphysema.   Electronically Signed   By: Inge Rise M.D.   On: 05/25/2015 19:08   ASSESSMENT AND PLAN: This is a very pleasant 74 years old African American female with metastatic non-small cell lung cancer, squamous cell carcinoma status post several chemotherapy regimen and and completed a course of palliative radiotherapy to the right hilum in February 2015.  She is currently on treatment with immunotherapy with Nivolumab status post 6 cycles. The patient is doing fine today. We will proceed with cycle #7 today as a scheduled. She was advised to call immediately if she has any concerning symptoms in the interval.  The patient voices understanding of current disease status and treatment options and is in agreement with the current care plan.  All questions were answered. The patient knows to call the clinic with any problems, questions or concerns. We can certainly see the patient much sooner if necessary.  Disclaimer: This note was dictated with voice recognition software. Similar sounding words can inadvertently be transcribed and may not be corrected upon review.

## 2015-05-30 NOTE — Telephone Encounter (Signed)
Per staff message and POF I have scheduled appts. Advised scheduler of appts. JMW  

## 2015-05-30 NOTE — Telephone Encounter (Signed)
per pof to sch pt appt-gave pt copy of avs-sent MW email to sch trmt-pt aware of appts °

## 2015-05-30 NOTE — Patient Instructions (Signed)
[facility] Discharge Instructions for Patients Receiving Chemotherapy  Today you received the following chemotherapy agents nivolumab  To help prevent nausea and vomiting after your treatment, we encourage you to take your nausea medication   If you develop nausea and vomiting that is not controlled by your nausea medication, call the clinic.   BELOW ARE SYMPTOMS THAT SHOULD BE REPORTED IMMEDIATELY:  *FEVER GREATER THAN 100.5 F  *CHILLS WITH OR WITHOUT FEVER  NAUSEA AND VOMITING THAT IS NOT CONTROLLED WITH YOUR NAUSEA MEDICATION  *UNUSUAL SHORTNESS OF BREATH  *UNUSUAL BRUISING OR BLEEDING  TENDERNESS IN MOUTH AND THROAT WITH OR WITHOUT PRESENCE OF ULCERS  *URINARY PROBLEMS  *BOWEL PROBLEMS  UNUSUAL RASH Items with * indicate a potential emergency and should be followed up as soon as possible.  Feel free to call the clinic you have any questions or concerns. The clinic phone number is [phone].  Please show the [facility] at check-in to the Emergency Department and triage nurse.

## 2015-06-06 ENCOUNTER — Telehealth: Payer: Self-pay | Admitting: Internal Medicine

## 2015-06-06 NOTE — Telephone Encounter (Signed)
This is not a [facility] patient but [facility] pt. Called [facility] and spoke with Jeani Hawking. She reports anytime pt needs a refill she has to call them to let them know to order her medications. Called and spoke with pt. Aware of above. Nothing further needed

## 2015-06-13 ENCOUNTER — Ambulatory Visit (HOSPITAL_BASED_OUTPATIENT_CLINIC_OR_DEPARTMENT_OTHER): Payer: Medicare Other

## 2015-06-13 ENCOUNTER — Ambulatory Visit (HOSPITAL_BASED_OUTPATIENT_CLINIC_OR_DEPARTMENT_OTHER): Payer: Medicare Other | Admitting: Physician Assistant

## 2015-06-13 ENCOUNTER — Encounter: Payer: Self-pay | Admitting: Physician Assistant

## 2015-06-13 ENCOUNTER — Telehealth: Payer: Self-pay | Admitting: Physician Assistant

## 2015-06-13 ENCOUNTER — Other Ambulatory Visit (HOSPITAL_BASED_OUTPATIENT_CLINIC_OR_DEPARTMENT_OTHER): Payer: Medicare Other

## 2015-06-13 ENCOUNTER — Telehealth: Payer: Self-pay | Admitting: *Deleted

## 2015-06-13 VITALS — BP 111/62 | HR 84 | Temp 98.6°F | Resp 18 | Ht 65.0 in | Wt 128.3 lb

## 2015-06-13 DIAGNOSIS — C3412 Malignant neoplasm of upper lobe, left bronchus or lung: Secondary | ICD-10-CM

## 2015-06-13 DIAGNOSIS — Z5112 Encounter for antineoplastic immunotherapy: Secondary | ICD-10-CM | POA: Diagnosis present

## 2015-06-13 DIAGNOSIS — C3411 Malignant neoplasm of upper lobe, right bronchus or lung: Secondary | ICD-10-CM

## 2015-06-13 DIAGNOSIS — C3490 Malignant neoplasm of unspecified part of unspecified bronchus or lung: Secondary | ICD-10-CM

## 2015-06-13 LAB — CBC WITH DIFFERENTIAL/PLATELET
BASO%: 0.7 % (ref 0.0–2.0)
Basophils Absolute: 0 10*3/uL (ref 0.0–0.1)
EOS ABS: 0.3 10*3/uL (ref 0.0–0.5)
EOS%: 4.8 % (ref 0.0–7.0)
HCT: 37.9 % (ref 34.8–46.6)
HEMOGLOBIN: 12.5 g/dL (ref 11.6–15.9)
LYMPH#: 1.5 10*3/uL (ref 0.9–3.3)
LYMPH%: 27.5 % (ref 14.0–49.7)
MCH: 27.3 pg (ref 25.1–34.0)
MCHC: 33 g/dL (ref 31.5–36.0)
MCV: 82.9 fL (ref 79.5–101.0)
MONO#: 0.6 10*3/uL (ref 0.1–0.9)
MONO%: 12.2 % (ref 0.0–14.0)
NEUT%: 54.8 % (ref 38.4–76.8)
NEUTROS ABS: 2.9 10*3/uL (ref 1.5–6.5)
Platelets: 188 10*3/uL (ref 145–400)
RBC: 4.57 10*6/uL (ref 3.70–5.45)
RDW: 16.3 % — AB (ref 11.2–14.5)
WBC: 5.3 10*3/uL (ref 3.9–10.3)

## 2015-06-13 LAB — COMPREHENSIVE METABOLIC PANEL (CC13)
ALBUMIN: 3.6 g/dL (ref 3.5–5.0)
ALK PHOS: 66 U/L (ref 40–150)
ALT: 14 U/L (ref 0–55)
AST: 14 U/L (ref 5–34)
Anion Gap: 10 mEq/L (ref 3–11)
BILIRUBIN TOTAL: 0.71 mg/dL (ref 0.20–1.20)
BUN: 10.2 mg/dL (ref 7.0–26.0)
CO2: 28 mEq/L (ref 22–29)
Calcium: 9.3 mg/dL (ref 8.4–10.4)
Chloride: 103 mEq/L (ref 98–109)
Creatinine: 0.8 mg/dL (ref 0.6–1.1)
EGFR: 81 mL/min/{1.73_m2} — AB (ref >90)
GLUCOSE: 67 mg/dL — AB (ref 70–140)
Potassium: 3.8 mEq/L (ref 3.5–5.1)
SODIUM: 141 meq/L (ref 136–145)
Total Protein: 6.8 g/dL (ref 6.4–8.3)

## 2015-06-13 MED ORDER — SODIUM CHLORIDE 0.9 % IJ SOLN
10.0000 mL | INTRAMUSCULAR | Status: DC | PRN
  Administered 2015-06-13: 10 mL
  Filled 2015-06-13: qty 10

## 2015-06-13 MED ORDER — HYDROCODONE-ACETAMINOPHEN 5-325 MG PO TABS: 1.0000 | ORAL_TABLET | ORAL | Status: DC | PRN

## 2015-06-13 MED ORDER — SODIUM CHLORIDE 0.9 % IV SOLN
180.0000 mg | Freq: Once | INTRAVENOUS | Status: AC
  Administered 2015-06-13: 180 mg via INTRAVENOUS
  Filled 2015-06-13: qty 18

## 2015-06-13 MED ORDER — SODIUM CHLORIDE 0.9 % IV SOLN
Freq: Once | INTRAVENOUS | Status: AC
  Administered 2015-06-13: 14:00:00 via INTRAVENOUS

## 2015-06-13 MED ORDER — HEPARIN SOD (PORK) LOCK FLUSH 100 UNIT/ML IV SOLN
500.0000 [IU] | Freq: Once | INTRAVENOUS | Status: AC | PRN
  Administered 2015-06-13: 500 [IU]
  Filled 2015-06-13: qty 5

## 2015-06-13 NOTE — Telephone Encounter (Signed)
Per staff message and POF I have scheduled appts. Advised scheduler of appts. JMW  

## 2015-06-13 NOTE — Telephone Encounter (Signed)
Pt confirmed labs/ov per 08/23 POF, gave pt avs and calendar.Cherylann Banas, sent msg to add chemo

## 2015-06-13 NOTE — Progress Notes (Addendum)
[facility] Telephone:(336) [phone]   Fax:(336) [place], [place], [address] [place] [address]  DIAGNOSIS: Metastatic non-small cell lung cancer, squamous cell carcinoma diagnosed in March of 2013.   PRIOR THERAPY:  1. Status post palliative radiotherapy to the left lung mass under the care of Dr. Pablo Ledger completed on 03/06/2012.  2. Systemic chemotherapy with carboplatin for AUC of 6 on day 1 and Abraxane 100 mg/M2 on days 1, 8 and 15 every 3 weeks. Status post 2 cycles. From cycle 3 forward AUC will be decreased to 4.5 given on day 1 and the Abraxane will be decreased to 90 mg per meter squared on days 1, 8 and 15 every 3 weeks, Status post a total of 3 cycles.  3. Systemic chemotherapy with carboplatin for AUC of 5 on day 1 and gemcitabine 1000 mg/m2 given on day 1 and day 8 every 3 weeks,status post 1 cycle. Due to significant neutropenia she will be dosed reduced beginning cycle 2 forward to carboplatin at an AUC of 4 given on day 1 and gemcitabine at 800 mg per meter squared given on days 1 and 8 every 3 weeks. Status post 6 cycles.  4. Status post palliative radiotherapy to the right hilum under the care of Dr. Pablo Ledger completed on 11/26/2013.  CURRENT THERAPY: Immunotherapy with Nivolumab 3 MG/KG every 2 weeks. First dose expected 03/07/2015. Status post 7 cycles.  CHEMOTHERAPY INTENT: Palliative  CURRENT # OF CHEMOTHERAPY CYCLES: 8  CURRENT ANTIEMETICS: Compazine  CURRENT SMOKING STATUS: Former smoker  ORAL CHEMOTHERAPY AND CONSENT: None  CURRENT BISPHOSPHONATES USE: None  PAIN MANAGEMENT: 5/10 right shoulder currently on Norco  NARCOTICS INDUCED CONSTIPATION: None  LIVING WILL AND CODE STATUS: No CODE BLUE   INTERVAL HISTORY: Destiny Harrison 74 y.o. female returns to the clinic today for followup visit accompanied by her son. The patient is currently on immunotherapy with Nivolumab every 2  weeks and tolerating it fairly well.  She denied having any significant weight loss or night sweats. She has no chest pain, but has shortness of breath increased with exertion and dry, cough with no hemoptysis. The patient denied having any significant fever or chills, nausea or vomiting, no diarrhea or skin rashes. She reports that overall she is feeling better and thinks her breathing has improved. She is here today to start cycle #8 of her immunotherapy.  MEDICAL HISTORY: Past Medical History  Diagnosis Date  . COPD (chronic obstructive pulmonary disease)   . Neutropenia, drug-induced 05/05/2012  . Hyperlipidemia   . Rheumatoid arthritis(714.0)   . Asthma   . Hiatal hernia   . History of chemotherapy   . History of radiation therapy 03/06/2012    left hilum  . History of radiation therapy 05/10/2013-05/31/2013    Left lung/ 33/75'@2'$ .25 per fraction x 15 fractions  . Radiation 11/15/13-11/26/13    Right hilum 30 Gy in 10 fractions  . Non-small cell lung cancer dx'd 08/28/11    left lung  . Encounter for antineoplastic immunotherapy 05/22/2015    ALLERGIES:  is allergic to shellfish allergy.  MEDICATIONS:  Current Outpatient Prescriptions  Medication Sig Dispense Refill  . albuterol (PROVENTIL) (2.5 MG/3ML) 0.083% nebulizer solution Take 2.5 mg by nebulization every 8 (eight) hours.    Marland Kitchen albuterol (VENTOLIN HFA) 108 (90 BASE) MCG/ACT inhaler Inhale 2 puffs into the lungs every 3 (three) hours as needed for wheezing or shortness of breath. 1 Inhaler 6  .  azithromycin (ZITHROMAX) 250 MG tablet Take 250 mg for 4 days (Patient not taking: Reported on 06/13/2015) 4 each 0  . benzonatate (TESSALON) 100 MG capsule Take 1 capsule (100 mg total) by mouth every 8 (eight) hours. (Patient not taking: Reported on 06/13/2015) 21 capsule 0  . calcium-vitamin D (OSCAL WITH D) 500-200 MG-UNIT per tablet Take 1 tablet by mouth daily.    Marland Kitchen doxycycline (VIBRAMYCIN) 100 MG capsule Take 1 capsule (100 mg total) by  mouth 2 (two) times daily. (Patient not taking: Reported on 06/13/2015) 20 capsule 0  . folic acid (FOLVITE) 235 MCG tablet Take 400 mcg by mouth daily.    Marland Kitchen guaiFENesin (MUCINEX) 600 MG 12 hr tablet Take 1 tablet (600 mg total) by mouth 2 (two) times daily. 30 tablet 1  . HYDROcodone-acetaminophen (NORCO/VICODIN) 5-325 MG per tablet Take 1 tablet by mouth every 4 (four) hours as needed for moderate pain. 30 tablet 0  . ipratropium (ATROVENT) 0.02 % nebulizer solution Take 0.5 mg by nebulization every 8 (eight) hours.    . ondansetron (ZOFRAN ODT) 8 MG disintegrating tablet Take 1 tablet (8 mg total) by mouth every 8 (eight) hours as needed for nausea or vomiting. 20 tablet 0  . predniSONE (DELTASONE) 20 MG tablet Take 1 tablet (20 mg total) by mouth 2 (two) times daily with a meal. 10 tablet 0  . PROAIR HFA 108 (90 BASE) MCG/ACT inhaler INHALE 2 PUFFS BY MOUTH EVERY 4 HOURS AS NEEDED FOR WHEEZING OR SHORTNESS OF BREATH 8.5 g 5  . prochlorperazine (COMPAZINE) 10 MG tablet Take 1 tablet (10 mg total) by mouth every 6 (six) hours as needed for nausea or vomiting. 30 tablet 0  . simvastatin (ZOCOR) 5 MG tablet Take 5 mg by mouth at bedtime.     No current facility-administered medications for this visit.   Facility-Administered Medications Ordered in Other Visits  Medication Dose Route Frequency Provider Last Rate Last Dose  . sodium chloride 0.9 % injection 10 mL  10 mL Intracatheter PRN Curt Bears, MD   10 mL at 06/13/15 1522    SURGICAL HISTORY:  Past Surgical History  Procedure Laterality Date  . Appendex  1962  . Video bronchoscopy  01/28/2012    Procedure: VIDEO BRONCHOSCOPY WITHOUT FLUORO;  Surgeon: Brand Males, MD;  Location: [facility] ENDOSCOPY;  Service: Endoscopy;  Laterality: Bilateral;  . Surgery on right wrist      REVIEW OF SYSTEMS:  A comprehensive review of systems was negative except for: Respiratory: positive for dyspnea on exertion, wheezing and improved   PHYSICAL  EXAMINATION: General appearance: alert, cooperative and no distress Head: Normocephalic, without obvious abnormality, atraumatic Neck: no adenopathy, no JVD, supple, symmetrical, trachea midline and thyroid not enlarged, symmetric, no tenderness/mass/nodules Lymph nodes: Cervical, supraclavicular, and axillary nodes normal. Resp: clear to auscultation bilaterally Back: symmetric, no curvature. ROM normal. No CVA tenderness. Cardio: regular rate and rhythm, S1, S2 normal, no murmur, click, rub or gallop GI: soft, non-tender; bowel sounds normal; no masses,  no organomegaly Extremities: extremities normal, atraumatic, no cyanosis or edema Neurologic: Alert and oriented X 3, normal strength and tone. Normal symmetric reflexes. Normal coordination and gait  ECOG PERFORMANCE STATUS: 1 - Symptomatic but completely ambulatory  Blood pressure 111/62, pulse 84, temperature 98.6 F (37 C), temperature source Oral, resp. rate 18, height '5\' 5"'$  (1.651 m), weight 128 lb 4.8 oz (58.196 kg), SpO2 100 %.  LABORATORY DATA: Lab Results  Component Value Date   WBC 5.3 06/13/2015  HGB 12.5 06/13/2015   HCT 37.9 06/13/2015   MCV 82.9 06/13/2015   PLT 188 06/13/2015      Chemistry      Component Value Date/Time   NA 141 06/13/2015 1135   NA 139 05/25/2015 1942   K 3.8 06/13/2015 1135   K 3.9 05/25/2015 1942   CL 102 05/25/2015 1942   CL 101 01/12/2013 0810   CO2 28 06/13/2015 1135   CO2 29 05/25/2015 1942   BUN 10.2 06/13/2015 1135   BUN 10 05/25/2015 1942   CREATININE 0.8 06/13/2015 1135   CREATININE 0.89 05/25/2015 1942      Component Value Date/Time   CALCIUM 9.3 06/13/2015 1135   CALCIUM 9.7 05/25/2015 1942   ALKPHOS 66 06/13/2015 1135   ALKPHOS 75 06/24/2014 2111   AST 14 06/13/2015 1135   AST 14 06/24/2014 2111   ALT 14 06/13/2015 1135   ALT 7 06/24/2014 2111   BILITOT 0.71 06/13/2015 1135   BILITOT 0.4 06/24/2014 2111       RADIOGRAPHIC STUDIES: Dg Chest 2 View  05/25/2015    CLINICAL DATA:  Shortness of breath and severe cough. History of lung cancer.  EXAM: CHEST  2 VIEW  COMPARISON:  Single view of the chest 04/21/2015 and CT chest 04/27/2015.  FINDINGS: The lungs are markedly emphysematous with extensive bilateral scarring. Right hilar nodule is again seen. Scarring along the aortic arch compatible with prior radiation therapy is also identified. No consolidative process, pneumothorax or effusion is identified.  IMPRESSION: No acute disease.  Right hilar nodule compatible with carcinoma. Post treatment change left lung also noted.  Emphysema.   Electronically Signed   By: Inge Rise M.D.   On: 05/25/2015 19:08   ASSESSMENT AND PLAN: This is a very pleasant 74 years old African American female with metastatic non-small cell lung cancer, squamous cell carcinoma status post several chemotherapy regimen and and completed a course of palliative radiotherapy to the right hilum in February 2015.  She is currently on treatment with immunotherapy with Nivolumab status post 7 cycles. The patient is doing fine today. The patient was discussed with and also seen by Dr. Julien Nordmann. We will proceed with cycle #8 today as a scheduled. She'll follow-up in 2 weeks prior to the start of cycle #9 with a restaging CT scan of the chest with contrast to re-evaluate her disease. She was advised to call immediately if she has any concerning symptoms in the interval.  The patient voices understanding of current disease status and treatment options and is in agreement with the current care plan.  All questions were answered. The patient knows to call the clinic with any problems, questions or concerns. We can certainly see the patient much sooner if necessary.  Carlton Adam, PA-C 06/13/2015  ADDENDUM: Hematology/Oncology Attending: I had a face to face encounter with the patient. I recommended her care plan. This is a very pleasant 74 years old African-American female with metastatic  non-small cell lung cancer, squamous cell carcinoma who is currently undergoing treatment with immunotherapy with Nivolumab status post 7 cycles. She is tolerating her treatment well with no specific complaints. I recommended for her to proceed with cycle #8 today as a scheduled. The patient would come back for follow-up visit in 2 weeks for evaluation after repeating CT scan of the chest for restaging of her disease. She was advised to call immediately if she has any concerning symptoms in the interval.  Disclaimer: This note was dictated with  voice recognition software. Similar sounding words can inadvertently be transcribed and may not be corrected upon review. Eilleen Kempf., MD 06/20/2015

## 2015-06-13 NOTE — Patient Instructions (Signed)
[facility] Discharge Instructions for Patients  Today you received the following: Nivolumab   To help prevent nausea and vomiting after your treatment, we encourage you to take your nausea medication as directed.    If you develop nausea and vomiting that is not controlled by your nausea medication, call the clinic.   BELOW ARE SYMPTOMS THAT SHOULD BE REPORTED IMMEDIATELY:  *FEVER GREATER THAN 100.5 F  *CHILLS WITH OR WITHOUT FEVER  NAUSEA AND VOMITING THAT IS NOT CONTROLLED WITH YOUR NAUSEA MEDICATION  *UNUSUAL SHORTNESS OF BREATH  *UNUSUAL BRUISING OR BLEEDING  TENDERNESS IN MOUTH AND THROAT WITH OR WITHOUT PRESENCE OF ULCERS  *URINARY PROBLEMS  *BOWEL PROBLEMS  UNUSUAL RASH Items with * indicate a potential emergency and should be followed up as soon as possible.  Feel free to call the clinic you have any questions or concerns. The clinic phone number is [phone].  Please show the CHEMO ALERT CARD at check-in to the Emergency Department and triage nurse.   

## 2015-06-19 NOTE — Patient Instructions (Signed)
Follow up in 2 weeks with a restaging CT scan of our chest to re-evaluate your disease, prior to your next cycle of immunotherapy

## 2015-06-22 ENCOUNTER — Encounter (HOSPITAL_COMMUNITY): Payer: Self-pay

## 2015-06-22 ENCOUNTER — Ambulatory Visit (HOSPITAL_COMMUNITY)
Admission: RE | Admit: 2015-06-22 | Discharge: 2015-06-22 | Disposition: A | Discharge status: Home / Self Care | Payer: Medicare Other | Source: Ambulatory Visit | Attending: Physician Assistant | Admitting: Physician Assistant

## 2015-06-22 DIAGNOSIS — Z803 Family history of malignant neoplasm of breast: Secondary | ICD-10-CM

## 2015-06-22 DIAGNOSIS — Z66 Do not resuscitate: Secondary | ICD-10-CM | POA: Diagnosis present

## 2015-06-22 DIAGNOSIS — J441 Chronic obstructive pulmonary disease with (acute) exacerbation: Secondary | ICD-10-CM | POA: Diagnosis not present

## 2015-06-22 DIAGNOSIS — Z87891 Personal history of nicotine dependence: Secondary | ICD-10-CM

## 2015-06-22 DIAGNOSIS — R0602 Shortness of breath: Secondary | ICD-10-CM | POA: Diagnosis not present

## 2015-06-22 DIAGNOSIS — Z9221 Personal history of antineoplastic chemotherapy: Secondary | ICD-10-CM | POA: Diagnosis not present

## 2015-06-22 DIAGNOSIS — C3490 Malignant neoplasm of unspecified part of unspecified bronchus or lung: Secondary | ICD-10-CM | POA: Insufficient documentation

## 2015-06-22 DIAGNOSIS — J9601 Acute respiratory failure with hypoxia: Secondary | ICD-10-CM | POA: Diagnosis present

## 2015-06-22 DIAGNOSIS — Z923 Personal history of irradiation: Secondary | ICD-10-CM | POA: Diagnosis not present

## 2015-06-22 DIAGNOSIS — Z833 Family history of diabetes mellitus: Secondary | ICD-10-CM | POA: Diagnosis not present

## 2015-06-22 DIAGNOSIS — Z79899 Other long term (current) drug therapy: Secondary | ICD-10-CM

## 2015-06-22 DIAGNOSIS — R000 Tachycardia, unspecified: Secondary | ICD-10-CM | POA: Diagnosis present

## 2015-06-22 DIAGNOSIS — C342 Malignant neoplasm of middle lobe, bronchus or lung: Secondary | ICD-10-CM | POA: Diagnosis present

## 2015-06-22 DIAGNOSIS — J80 Acute respiratory distress syndrome: Secondary | ICD-10-CM | POA: Diagnosis not present

## 2015-06-22 DIAGNOSIS — D649 Anemia, unspecified: Secondary | ICD-10-CM | POA: Diagnosis not present

## 2015-06-22 DIAGNOSIS — R911 Solitary pulmonary nodule: Secondary | ICD-10-CM | POA: Diagnosis not present

## 2015-06-22 DIAGNOSIS — C3492 Malignant neoplasm of unspecified part of left bronchus or lung: Secondary | ICD-10-CM | POA: Diagnosis not present

## 2015-06-22 MED ORDER — IOHEXOL 300 MG/ML  SOLN
75.0000 mL | Freq: Once | INTRAMUSCULAR | Status: AC | PRN
  Administered 2015-06-22: 75 mL via INTRAVENOUS

## 2015-06-25 ENCOUNTER — Encounter (HOSPITAL_COMMUNITY): Payer: Self-pay | Admitting: Emergency Medicine

## 2015-06-25 ENCOUNTER — Emergency Department (HOSPITAL_COMMUNITY): Payer: Medicare Other

## 2015-06-25 ENCOUNTER — Inpatient Hospital Stay (HOSPITAL_COMMUNITY)
Admission: EM | Admit: 2015-06-25 | Discharge: 2015-06-27 | DRG: 190 | Disposition: A | Discharge status: Home / Self Care | Payer: Medicare Other | Attending: Internal Medicine | Admitting: Internal Medicine

## 2015-06-25 DIAGNOSIS — C3411 Malignant neoplasm of upper lobe, right bronchus or lung: Secondary | ICD-10-CM | POA: Diagnosis present

## 2015-06-25 DIAGNOSIS — R000 Tachycardia, unspecified: Secondary | ICD-10-CM | POA: Diagnosis present

## 2015-06-25 DIAGNOSIS — J9601 Acute respiratory failure with hypoxia: Secondary | ICD-10-CM | POA: Diagnosis not present

## 2015-06-25 DIAGNOSIS — Z79899 Other long term (current) drug therapy: Secondary | ICD-10-CM | POA: Diagnosis not present

## 2015-06-25 DIAGNOSIS — J441 Chronic obstructive pulmonary disease with (acute) exacerbation: Secondary | ICD-10-CM | POA: Diagnosis not present

## 2015-06-25 DIAGNOSIS — R0602 Shortness of breath: Secondary | ICD-10-CM | POA: Diagnosis not present

## 2015-06-25 DIAGNOSIS — Z66 Do not resuscitate: Secondary | ICD-10-CM | POA: Diagnosis present

## 2015-06-25 DIAGNOSIS — D649 Anemia, unspecified: Secondary | ICD-10-CM | POA: Diagnosis not present

## 2015-06-25 DIAGNOSIS — J80 Acute respiratory distress syndrome: Secondary | ICD-10-CM | POA: Diagnosis not present

## 2015-06-25 DIAGNOSIS — R0600 Dyspnea, unspecified: Secondary | ICD-10-CM

## 2015-06-25 DIAGNOSIS — C342 Malignant neoplasm of middle lobe, bronchus or lung: Secondary | ICD-10-CM | POA: Diagnosis present

## 2015-06-25 DIAGNOSIS — Z803 Family history of malignant neoplasm of breast: Secondary | ICD-10-CM | POA: Diagnosis not present

## 2015-06-25 DIAGNOSIS — R0603 Acute respiratory distress: Secondary | ICD-10-CM | POA: Diagnosis present

## 2015-06-25 DIAGNOSIS — I471 Supraventricular tachycardia: Secondary | ICD-10-CM

## 2015-06-25 DIAGNOSIS — Z87891 Personal history of nicotine dependence: Secondary | ICD-10-CM | POA: Diagnosis not present

## 2015-06-25 DIAGNOSIS — C3490 Malignant neoplasm of unspecified part of unspecified bronchus or lung: Secondary | ICD-10-CM

## 2015-06-25 DIAGNOSIS — Z9221 Personal history of antineoplastic chemotherapy: Secondary | ICD-10-CM | POA: Diagnosis not present

## 2015-06-25 DIAGNOSIS — C3492 Malignant neoplasm of unspecified part of left bronchus or lung: Secondary | ICD-10-CM | POA: Diagnosis not present

## 2015-06-25 DIAGNOSIS — Z923 Personal history of irradiation: Secondary | ICD-10-CM | POA: Diagnosis not present

## 2015-06-25 DIAGNOSIS — Z833 Family history of diabetes mellitus: Secondary | ICD-10-CM | POA: Diagnosis not present

## 2015-06-25 HISTORY — DX: Do not resuscitate: Z66

## 2015-06-25 LAB — BASIC METABOLIC PANEL
ANION GAP: 11 (ref 5–15)
BUN: 12 mg/dL (ref 6–20)
CALCIUM: 9.4 mg/dL (ref 8.9–10.3)
CO2: 26 mmol/L (ref 22–32)
Chloride: 101 mmol/L (ref 101–111)
Creatinine, Ser: 0.93 mg/dL (ref 0.44–1.00)
GFR calc Af Amer: >60 mL/min (ref >60)
GFR, EST NON AFRICAN AMERICAN: 59 mL/min — AB (ref >60)
GLUCOSE: 128 mg/dL — AB (ref 65–99)
Potassium: 3.6 mmol/L (ref 3.5–5.1)
Sodium: 138 mmol/L (ref 135–145)

## 2015-06-25 LAB — BLOOD GAS, ARTERIAL
Acid-Base Excess: 0.4 mmol/L (ref 0.0–2.0)
BICARBONATE: 24.1 meq/L — AB (ref 20.0–24.0)
Drawn by: 232811
O2 CONTENT: 2 L/min
O2 Saturation: 92.7 %
PATIENT TEMPERATURE: 98
PCO2 ART: 36.7 mmHg (ref 35.0–45.0)
PH ART: 7.431 (ref 7.350–7.450)
PO2 ART: 70.2 mmHg — AB (ref 80.0–100.0)
TCO2: 21.5 mmol/L (ref 0–100)

## 2015-06-25 LAB — CBC
HCT: 40.3 % (ref 36.0–46.0)
HEMOGLOBIN: 13.7 g/dL (ref 12.0–15.0)
MCH: 27.7 pg (ref 26.0–34.0)
MCHC: 34 g/dL (ref 30.0–36.0)
MCV: 81.6 fL (ref 78.0–100.0)
Platelets: 354 10*3/uL (ref 150–400)
RBC: 4.94 MIL/uL (ref 3.87–5.11)
RDW: 15 % (ref 11.5–15.5)
WBC: 7 10*3/uL (ref 4.0–10.5)

## 2015-06-25 MED ORDER — ALBUTEROL SULFATE (2.5 MG/3ML) 0.083% IN NEBU: 5.0000 mg | INHALATION_SOLUTION | Freq: Once | RESPIRATORY_TRACT | Status: DC

## 2015-06-25 MED ORDER — SODIUM CHLORIDE 0.9 % IJ SOLN
3.0000 mL | Freq: Two times a day (BID) | INTRAMUSCULAR | Status: DC
  Administered 2015-06-26 – 2015-06-27 (×3): 3 mL via INTRAVENOUS

## 2015-06-25 MED ORDER — ALBUTEROL (5 MG/ML) CONTINUOUS INHALATION SOLN
10.0000 mg/h | INHALATION_SOLUTION | Freq: Once | RESPIRATORY_TRACT | Status: AC
  Administered 2015-06-25: 10 mg/h via RESPIRATORY_TRACT
  Filled 2015-06-25: qty 20

## 2015-06-25 MED ORDER — METHYLPREDNISOLONE SODIUM SUCC 125 MG IJ SOLR
125.0000 mg | Freq: Once | INTRAMUSCULAR | Status: AC
  Administered 2015-06-25: 125 mg via INTRAVENOUS
  Filled 2015-06-25: qty 2

## 2015-06-25 MED ORDER — IPRATROPIUM-ALBUTEROL 0.5-2.5 (3) MG/3ML IN SOLN
RESPIRATORY_TRACT | Status: AC
  Filled 2015-06-25: qty 3

## 2015-06-25 MED ORDER — HYDROCODONE-ACETAMINOPHEN 5-325 MG PO TABS
1.0000 | ORAL_TABLET | ORAL | Status: DC | PRN
  Administered 2015-06-26 – 2015-06-27 (×2): 1 via ORAL
  Filled 2015-06-25 (×2): qty 1

## 2015-06-25 MED ORDER — SIMVASTATIN 5 MG PO TABS
5.0000 mg | ORAL_TABLET | Freq: Every day | ORAL | Status: DC
  Administered 2015-06-26 (×2): 5 mg via ORAL
  Filled 2015-06-25 (×5): qty 1

## 2015-06-25 MED ORDER — ACETAMINOPHEN 650 MG RE SUPP: 650.0000 mg | Freq: Four times a day (QID) | RECTAL | Status: DC | PRN

## 2015-06-25 MED ORDER — IPRATROPIUM-ALBUTEROL 0.5-2.5 (3) MG/3ML IN SOLN
3.0000 mL | Freq: Once | RESPIRATORY_TRACT | Status: AC
  Administered 2015-06-25: 3 mL via RESPIRATORY_TRACT
  Filled 2015-06-25: qty 3

## 2015-06-25 MED ORDER — METHYLPREDNISOLONE SODIUM SUCC 125 MG IJ SOLR
60.0000 mg | Freq: Four times a day (QID) | INTRAMUSCULAR | Status: DC
  Administered 2015-06-26 (×2): 60 mg via INTRAVENOUS
  Filled 2015-06-25 (×2): qty 2

## 2015-06-25 MED ORDER — IPRATROPIUM-ALBUTEROL 0.5-2.5 (3) MG/3ML IN SOLN: 3.0000 mL | Freq: Four times a day (QID) | RESPIRATORY_TRACT | Status: DC

## 2015-06-25 MED ORDER — ONDANSETRON HCL 4 MG/2ML IJ SOLN: 4.0000 mg | Freq: Four times a day (QID) | INTRAMUSCULAR | Status: DC | PRN

## 2015-06-25 MED ORDER — LEVOFLOXACIN IN D5W 750 MG/150ML IV SOLN
750.0000 mg | Freq: Once | INTRAVENOUS | Status: AC
  Administered 2015-06-25: 750 mg via INTRAVENOUS
  Filled 2015-06-25: qty 150

## 2015-06-25 MED ORDER — ENOXAPARIN SODIUM 40 MG/0.4ML ~~LOC~~ SOLN
40.0000 mg | Freq: Every day | SUBCUTANEOUS | Status: DC
  Administered 2015-06-26: 40 mg via SUBCUTANEOUS
  Filled 2015-06-25: qty 0.4

## 2015-06-25 MED ORDER — ONDANSETRON HCL 4 MG PO TABS: 4.0000 mg | ORAL_TABLET | Freq: Four times a day (QID) | ORAL | Status: DC | PRN

## 2015-06-25 MED ORDER — FOLIC ACID 1 MG PO TABS
1000.0000 ug | ORAL_TABLET | Freq: Every day | ORAL | Status: DC
  Administered 2015-06-26 – 2015-06-27 (×2): 1 mg via ORAL
  Filled 2015-06-25 (×2): qty 1

## 2015-06-25 MED ORDER — GUAIFENESIN ER 600 MG PO TB12
600.0000 mg | ORAL_TABLET | Freq: Two times a day (BID) | ORAL | Status: DC
  Administered 2015-06-25 – 2015-06-27 (×4): 600 mg via ORAL
  Filled 2015-06-25 (×5): qty 1

## 2015-06-25 MED ORDER — IPRATROPIUM-ALBUTEROL 0.5-2.5 (3) MG/3ML IN SOLN
3.0000 mL | Freq: Once | RESPIRATORY_TRACT | Status: AC
  Administered 2015-06-25: 3 mL via RESPIRATORY_TRACT

## 2015-06-25 MED ORDER — ACETAMINOPHEN 325 MG PO TABS: 650.0000 mg | ORAL_TABLET | Freq: Four times a day (QID) | ORAL | Status: DC | PRN

## 2015-06-25 NOTE — ED Notes (Signed)
Paged Dr.Patel to inform him of elevated heart rate. He reports attempt to call floor and see if they will accept patient. He believes the albuterol and tachycardia are related and feels as if it is appropriate for the 4 th floor.  Spoke to [employer] on 4 [address] and she reports that she will call me back. Will continue to monitor.

## 2015-06-25 NOTE — ED Notes (Signed)
Respiratory at bedside.

## 2015-06-25 NOTE — ED Notes (Signed)
Hospitalist at bedside 

## 2015-06-25 NOTE — ED Notes (Signed)
Lisa from, respiratory called and reports she will assess patient when continuous neb order is placed.

## 2015-06-25 NOTE — Progress Notes (Signed)
Pt has completed the CAT and placed on 2lnc.  Per Dr Posey Pronto at bedside, hold off on obtaining abg for at least 30 minutes.

## 2015-06-25 NOTE — ED Notes (Addendum)
Consulted MD Knott about little change in patient's respiratory status. He reports administer two more duonebs then continuous. Respiratory called by Charge RN Coralyn Mark to assess patient.

## 2015-06-25 NOTE — ED Notes (Addendum)
Pt from home c/o shortness of breath. Hx lung CA. Left side diminished lung sounds right sided inspiratory and expiratory wheezes. She takes immunotherapy.

## 2015-06-25 NOTE — ED Provider Notes (Signed)
CSN: [id number]     Arrival date & time 06/25/15  1950 History   First MD Initiated Contact with Patient 06/25/15 2000     Chief Complaint  Patient presents with  . Shortness of Breath     (Consider location/radiation/quality/duration/timing/severity/associated sxs/prior Treatment) Patient is a 74 y.o. female presenting with shortness of breath. The history is provided by the patient and a relative.  Shortness of Breath Severity:  Moderate Onset quality:  Gradual Duration:  3 days Timing:  Constant Progression:  Worsening Chronicity:  Recurrent Context: not URI   Relieved by:  Nothing Worsened by:  Nothing tried Ineffective treatments: home nebulizers. Associated symptoms: cough (worst last night), sputum production (increased) and wheezing   Associated symptoms: no fever, no hemoptysis and no vomiting   Risk factors: hx of cancer     Past Medical History  Diagnosis Date  . COPD (chronic obstructive pulmonary disease)   . Neutropenia, drug-induced 05/05/2012  . Hyperlipidemia   . Rheumatoid arthritis(714.0)   . Asthma   . Hiatal hernia   . History of chemotherapy   . History of radiation therapy 03/06/2012    left hilum  . History of radiation therapy 05/10/2013-05/31/2013    Left lung/ 33/75'@2'$ .25 per fraction x 15 fractions  . Radiation 11/15/13-11/26/13    Right hilum 30 Gy in 10 fractions  . Non-small cell lung cancer dx'd 08/28/11    left lung  . Encounter for antineoplastic immunotherapy 05/22/2015   Past Surgical History  Procedure Laterality Date  . Appendex  1962  . Video bronchoscopy  01/28/2012    Procedure: VIDEO BRONCHOSCOPY WITHOUT FLUORO;  Surgeon: Brand Males, MD;  Location: [facility] ENDOSCOPY;  Service: Endoscopy;  Laterality: Bilateral;  . Surgery on right wrist     Family History  Problem Relation Age of Onset  . Diabetes Mother     insulin dependent  . Breast cancer Mother    Social History  Substance Use Topics  . Smoking status: Former Smoker --  0.50 packs/day for 40 years    Types: Cigarettes    Quit date: 10/29/2009  . Smokeless tobacco: Never Used  . Alcohol Use: No   OB History    No data available     Review of Systems  Constitutional: Negative for fever.  Respiratory: Positive for cough (worst last night), sputum production (increased), shortness of breath and wheezing. Negative for hemoptysis.   Gastrointestinal: Negative for vomiting.  All other systems reviewed and are negative.     Allergies  Shellfish allergy  Home Medications   Prior to Admission medications   Medication Sig Start Date End Date Taking? Authorizing Provider  albuterol (PROVENTIL) (2.5 MG/3ML) 0.083% nebulizer solution Take 2.5 mg by nebulization every 8 (eight) hours.    Historical Provider, MD  albuterol (VENTOLIN HFA) 108 (90 BASE) MCG/ACT inhaler Inhale 2 puffs into the lungs every 3 (three) hours as needed for wheezing or shortness of breath. 02/22/14   Brand Males, MD  azithromycin (ZITHROMAX) 250 MG tablet Take 250 mg for 4 days Patient not taking: Reported on 06/13/2015 04/23/15   Robbie Lis, MD  benzonatate (TESSALON) 100 MG capsule Take 1 capsule (100 mg total) by mouth every 8 (eight) hours. Patient not taking: Reported on 06/13/2015 05/25/15   Tanna Furry, MD  calcium-vitamin D (OSCAL WITH D) 500-200 MG-UNIT per tablet Take 1 tablet by mouth daily.    Historical Provider, MD  doxycycline (VIBRAMYCIN) 100 MG capsule Take 1 capsule (100 mg total) by mouth  2 (two) times daily. Patient not taking: Reported on 06/13/2015 05/25/15   Tanna Furry, MD  folic acid (FOLVITE) 478 MCG tablet Take 400 mcg by mouth daily.    Historical Provider, MD  guaiFENesin (MUCINEX) 600 MG 12 hr tablet Take 1 tablet (600 mg total) by mouth 2 (two) times daily. 05/25/15   Tanna Furry, MD  HYDROcodone-acetaminophen (NORCO/VICODIN) 5-325 MG per tablet Take 1 tablet by mouth every 4 (four) hours as needed for moderate pain. 06/13/15   Carlton Adam, PA-C  ipratropium  (ATROVENT) 0.02 % nebulizer solution Take 0.5 mg by nebulization every 8 (eight) hours.    Historical Provider, MD  ondansetron (ZOFRAN ODT) 8 MG disintegrating tablet Take 1 tablet (8 mg total) by mouth every 8 (eight) hours as needed for nausea or vomiting. 04/04/15   Maryanna Shape, NP  predniSONE (DELTASONE) 20 MG tablet Take 1 tablet (20 mg total) by mouth 2 (two) times daily with a meal. 05/25/15   Tanna Furry, MD  PROAIR HFA 108 (90 BASE) MCG/ACT inhaler INHALE 2 PUFFS BY MOUTH EVERY 4 HOURS AS NEEDED FOR WHEEZING OR SHORTNESS OF BREATH 04/21/15   Brand Males, MD  prochlorperazine (COMPAZINE) 10 MG tablet Take 1 tablet (10 mg total) by mouth every 6 (six) hours as needed for nausea or vomiting. 03/21/15   Carlton Adam, PA-C  simvastatin (ZOCOR) 5 MG tablet Take 5 mg by mouth at bedtime.    Historical Provider, MD   BP 147/100 mmHg  Pulse 136  Temp(Src) 98 F (36.7 C) (Oral)  Resp 26  Wt 128 lb (58.06 kg)  SpO2 95% Physical Exam  Constitutional: She is oriented to person, place, and time. She appears well-developed and well-nourished. She appears toxic. She appears distressed.  HENT:  Head: Normocephalic.  Eyes: Conjunctivae are normal.  Neck: Neck supple. No tracheal deviation present.  Retractions  Cardiovascular: Regular rhythm and normal heart sounds.  Tachycardia present.   Pulmonary/Chest: Accessory muscle usage present. No stridor. Tachypnea noted. She is in respiratory distress. She has no decreased breath sounds. She has wheezes (diffuse, tight expiratory>inspiratory).  Abdominal: Soft. She exhibits no distension.  Neurological: She is alert and oriented to person, place, and time.  Skin: Skin is warm and dry.  Psychiatric: She has a normal mood and affect.    ED Course  Procedures (including critical care time) Labs Review Labs Reviewed  BASIC METABOLIC PANEL - Abnormal; Notable for the following:    Glucose, Bld 128 (*)    GFR calc non Af Amer 59 (*)    All  other components within normal limits  CBC  BLOOD GAS, ARTERIAL    Imaging Review Dg Chest Port 1 View  06/25/2015   CLINICAL DATA:  Shortness of breath for 2 days. History of left lung cancer and COPD.  EXAM: PORTABLE CHEST - 1 VIEW  COMPARISON:  06/22/2015 chest CT.  FINDINGS: Emphysema. Left suprahilar and right perihilar opacity as shown on recent chest CT due to right middle lobe nodule and left suprahilar radiation fibrosis. 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  Power injectable Port-A-Cath tip: SVC. Heart size within normal limits.  Atherosclerotic aortic arch.  IMPRESSION: 1. Relatively stable appearance of the chest, with emphysema ; a right middle lobe nodule causing the right hilar prominence; left paramediastinal fibrosis along the AP window causing density in this vicinity; and convex right upper mediastinal contour due to  a known mass in the mediastinum.   Electronically Signed   By: Van Clines M.D.   On: 06/25/2015 20:44   I have personally reviewed and evaluated these images and lab results as part of my medical decision-making.   EKG Interpretation None      MDM   Final diagnoses:  COPD with exacerbation  Malignant neoplasm of lung, unspecified laterality, unspecified part of lung    74 year old female with history of lung cancer, COPD, multiple admissions for exacerbation presents with increasing sputum production, cough, shortness of breath, and ineffectiveness of her home nebulizer treatments. On arrival she has neck and subcostal retractions with prolonged expiratory phase and appears in moderate distress. She is provided 3 successive duo nebs and had minimal improvement of her symptoms. She was given an IV dose of steroids, started on a continuous nebulization treatment, and given empiric antibiotics to cover atypical organisms given her increased sputum production. X-rays consistent  with previous studies and unchanged. Recent CT scan showed no evidence of effusion or obstruction to suggest development of new infection. Patient is afebrile and otherwise hemodynamically stable. Hospitalist was consulted for admission and will see the patient in the emergency department.     Leo Grosser, MD 06/26/15 [phone]

## 2015-06-25 NOTE — ED Notes (Addendum)
Pt reports that she feels some better however, patient still appears labored, expiratory and inspiratory wheezes bilaterally. SPO2 maintaining with 2L [place].

## 2015-06-25 NOTE — ED Notes (Addendum)
Continuous neb in process. Pt appears less labored. Heart rate decreased to 111 Sinus tachycardia. Will continue to monitor. Family remain at bedside.

## 2015-06-25 NOTE — ED Notes (Signed)
MD at bedside. 

## 2015-06-26 ENCOUNTER — Encounter (HOSPITAL_COMMUNITY): Payer: Self-pay | Admitting: Internal Medicine

## 2015-06-26 DIAGNOSIS — R000 Tachycardia, unspecified: Secondary | ICD-10-CM | POA: Diagnosis present

## 2015-06-26 DIAGNOSIS — Z66 Do not resuscitate: Secondary | ICD-10-CM

## 2015-06-26 DIAGNOSIS — J441 Chronic obstructive pulmonary disease with (acute) exacerbation: Principal | ICD-10-CM

## 2015-06-26 HISTORY — DX: Do not resuscitate: Z66

## 2015-06-26 LAB — CBC
HCT: 37.7 % (ref 36.0–46.0)
HEMOGLOBIN: 12.7 g/dL (ref 12.0–15.0)
MCH: 27.7 pg (ref 26.0–34.0)
MCHC: 33.7 g/dL (ref 30.0–36.0)
MCV: 82.1 fL (ref 78.0–100.0)
Platelets: 284 10*3/uL (ref 150–400)
RBC: 4.59 MIL/uL (ref 3.87–5.11)
RDW: 15 % (ref 11.5–15.5)
WBC: 6.4 10*3/uL (ref 4.0–10.5)

## 2015-06-26 LAB — INFLUENZA PANEL BY PCR (TYPE A & B)
H1N1FLUPCR: NOT DETECTED
Influenza A By PCR: NEGATIVE
Influenza B By PCR: NEGATIVE

## 2015-06-26 LAB — COMPREHENSIVE METABOLIC PANEL
ALK PHOS: 64 U/L (ref 38–126)
ALT: 16 U/L (ref 14–54)
ANION GAP: 10 (ref 5–15)
AST: 23 U/L (ref 15–41)
Albumin: 3.9 g/dL (ref 3.5–5.0)
BILIRUBIN TOTAL: 0.8 mg/dL (ref 0.3–1.2)
BUN: 13 mg/dL (ref 6–20)
CALCIUM: 9.4 mg/dL (ref 8.9–10.3)
CO2: 26 mmol/L (ref 22–32)
Chloride: 100 mmol/L — ABNORMAL LOW (ref 101–111)
Creatinine, Ser: 0.88 mg/dL (ref 0.44–1.00)
Glucose, Bld: 198 mg/dL — ABNORMAL HIGH (ref 65–99)
Potassium: 3.4 mmol/L — ABNORMAL LOW (ref 3.5–5.1)
SODIUM: 136 mmol/L (ref 135–145)
TOTAL PROTEIN: 7.3 g/dL (ref 6.5–8.1)

## 2015-06-26 LAB — MRSA PCR SCREENING: MRSA by PCR: NEGATIVE

## 2015-06-26 LAB — D-DIMER, QUANTITATIVE: D-Dimer, Quant: 1.92 ug/mL-FEU — ABNORMAL HIGH (ref 0.00–0.48)

## 2015-06-26 MED ORDER — ENOXAPARIN SODIUM 60 MG/0.6ML ~~LOC~~ SOLN
1.0000 mg/kg | Freq: Two times a day (BID) | SUBCUTANEOUS | Status: DC
  Administered 2015-06-27: 55 mg via SUBCUTANEOUS
  Filled 2015-06-26: qty 0.6

## 2015-06-26 MED ORDER — ENOXAPARIN SODIUM 60 MG/0.6ML ~~LOC~~ SOLN
1.0000 mg/kg | Freq: Once | SUBCUTANEOUS | Status: AC
  Administered 2015-06-26: 55 mg via SUBCUTANEOUS
  Filled 2015-06-26 (×2): qty 0.6

## 2015-06-26 MED ORDER — LEVOFLOXACIN 750 MG PO TABS
750.0000 mg | ORAL_TABLET | Freq: Every day | ORAL | Status: DC
  Administered 2015-06-26: 750 mg via ORAL
  Filled 2015-06-26 (×2): qty 1

## 2015-06-26 MED ORDER — IPRATROPIUM-ALBUTEROL 0.5-2.5 (3) MG/3ML IN SOLN
3.0000 mL | Freq: Four times a day (QID) | RESPIRATORY_TRACT | Status: DC
  Administered 2015-06-27 (×3): 3 mL via RESPIRATORY_TRACT
  Filled 2015-06-26 (×3): qty 3

## 2015-06-26 MED ORDER — POTASSIUM CHLORIDE CRYS ER 20 MEQ PO TBCR
40.0000 meq | EXTENDED_RELEASE_TABLET | Freq: Once | ORAL | Status: DC
  Filled 2015-06-26: qty 2

## 2015-06-26 MED ORDER — ENOXAPARIN SODIUM 60 MG/0.6ML ~~LOC~~ SOLN
1.0000 mg/kg | SUBCUTANEOUS | Status: AC
  Administered 2015-06-26: 55 mg via SUBCUTANEOUS
  Filled 2015-06-26: qty 0.6

## 2015-06-26 MED ORDER — LEVOFLOXACIN IN D5W 750 MG/150ML IV SOLN: 750.0000 mg | INTRAVENOUS | Status: DC

## 2015-06-26 MED ORDER — IPRATROPIUM-ALBUTEROL 0.5-2.5 (3) MG/3ML IN SOLN: 3.0000 mL | RESPIRATORY_TRACT | Status: DC | PRN

## 2015-06-26 MED ORDER — IPRATROPIUM-ALBUTEROL 0.5-2.5 (3) MG/3ML IN SOLN
3.0000 mL | RESPIRATORY_TRACT | Status: DC
  Administered 2015-06-26 (×5): 3 mL via RESPIRATORY_TRACT
  Filled 2015-06-26 (×6): qty 3

## 2015-06-26 MED ORDER — METHYLPREDNISOLONE SODIUM SUCC 125 MG IJ SOLR
60.0000 mg | Freq: Two times a day (BID) | INTRAMUSCULAR | Status: DC
  Administered 2015-06-26 – 2015-06-27 (×2): 60 mg via INTRAVENOUS
  Filled 2015-06-26 (×3): qty 0.96

## 2015-06-26 MED ORDER — HYDROCODONE-HOMATROPINE 5-1.5 MG/5ML PO SYRP
5.0000 mL | ORAL_SOLUTION | ORAL | Status: DC | PRN
  Administered 2015-06-26 – 2015-06-27 (×4): 5 mL via ORAL
  Filled 2015-06-26 (×4): qty 5

## 2015-06-26 NOTE — H&P (Signed)
[facility] History and Physical  Patient: Destiny Harrison  MRN: [id number]  DOB: November 23, 1940  DOS: the patient was seen and examined on 06/25/2015 PCP: Thressa Sheller, MD  Referring physician: Dr. Laneta Simmers Chief Complaint: Shortness of breath  HPI: Bonny Vanleeuwen is a 74 y.o. female with Past medical history of COPD, non-small cell lung cancer of the left lung, radiation therapy, history of chemotherapy, anemia. The patient is presenting with complaints of worsening shortness of breath starting today. She has baseline shortness of breath but starting today at 2 PM she started having worsening of that. She also mentions that she has been feeling diaphoretic and warm. Started having some chest tightness on deep breathing. Next and denies any chest pain or chest tightness the time of my evaluation. Patient denies having any fever or chills. She does have some cough without any expectoration. Denies having any choking episodes or recent travel recent surgery recent procedures recent leg swelling. Denies having any diarrhea constipation abdominal pain burning urination. She is currently on chemotherapy Nivolumab, last treatment was on 06/13/2015.  The patient is coming from home.  At her baseline ambulates without any support And is independent for most of her ADL manages her medication on her own.  Review of Systems: as mentioned in the history of present illness.  A comprehensive review of the other systems is negative.  Past Medical History  Diagnosis Date  . COPD (chronic obstructive pulmonary disease)   . Neutropenia, drug-induced 05/05/2012  . Hyperlipidemia   . Rheumatoid arthritis(714.0)   . Asthma   . Hiatal hernia   . History of chemotherapy   . History of radiation therapy 03/06/2012    left hilum  . History of radiation therapy 05/10/2013-05/31/2013    Left lung/ 33/75'@2'$ .25 per fraction x 15 fractions  . Radiation 11/15/13-11/26/13    Right hilum 30 Gy in 10 fractions  . Non-small  cell lung cancer dx'd 08/28/11    left lung  . Encounter for antineoplastic immunotherapy 05/22/2015   Past Surgical History  Procedure Laterality Date  . Appendex  1962  . Video bronchoscopy  01/28/2012    Procedure: VIDEO BRONCHOSCOPY WITHOUT FLUORO;  Surgeon: Brand Males, MD;  Location: [facility] ENDOSCOPY;  Service: Endoscopy;  Laterality: Bilateral;  . Surgery on right wrist     Social History:  reports that she quit smoking about 5 years ago. Her smoking use included Cigarettes. She has a 20 pack-year smoking history. She has never used smokeless tobacco. She reports that she does not drink alcohol or use illicit drugs.  Allergies  Allergen Reactions  . Shellfish Allergy Swelling    Family History  Problem Relation Age of Onset  . Diabetes Mother     insulin dependent  . Breast cancer Mother     Prior to Admission medications   Medication Sig Start Date End Date Taking? Authorizing Provider  albuterol (PROVENTIL) (2.5 MG/3ML) 0.083% nebulizer solution Take 2.5 mg by nebulization every 8 (eight) hours.   Yes Historical Provider, MD  albuterol (VENTOLIN HFA) 108 (90 BASE) MCG/ACT inhaler Inhale 2 puffs into the lungs every 3 (three) hours as needed for wheezing or shortness of breath. 02/22/14  Yes Brand Males, MD  calcium-vitamin D (OSCAL WITH D) 500-200 MG-UNIT per tablet Take 1 tablet by mouth daily.   Yes Historical Provider, MD  folic acid (FOLVITE) 481 MCG tablet Take 400 mcg by mouth daily.   Yes Historical Provider, MD  guaiFENesin (MUCINEX) 600 MG 12 hr tablet Take 1 tablet (600  mg total) by mouth 2 (two) times daily. 05/25/15  Yes Tanna Furry, MD  HYDROcodone-acetaminophen (NORCO/VICODIN) 5-325 MG per tablet Take 1 tablet by mouth every 4 (four) hours as needed for moderate pain. 06/13/15  Yes Adrena E Johnson, PA-C  ipratropium (ATROVENT) 0.02 % nebulizer solution Take 0.5 mg by nebulization every 8 (eight) hours.   Yes Historical Provider, MD  ondansetron (ZOFRAN ODT) 8 MG  disintegrating tablet Take 1 tablet (8 mg total) by mouth every 8 (eight) hours as needed for nausea or vomiting. 04/04/15  Yes Maryanna Shape, NP  predniSONE (DELTASONE) 5 MG tablet Take 1 tablet by mouth at bedtime. 06/12/15  Yes Historical Provider, MD  prochlorperazine (COMPAZINE) 10 MG tablet Take 1 tablet (10 mg total) by mouth every 6 (six) hours as needed for nausea or vomiting. 03/21/15  Yes Adrena E Johnson, PA-C  simvastatin (ZOCOR) 5 MG tablet Take 5 mg by mouth at bedtime.   Yes Historical Provider, MD  azithromycin (ZITHROMAX) 250 MG tablet Take 250 mg for 4 days Patient not taking: Reported on 06/13/2015 04/23/15   Robbie Lis, MD  benzonatate (TESSALON) 100 MG capsule Take 1 capsule (100 mg total) by mouth every 8 (eight) hours. Patient not taking: Reported on 06/13/2015 05/25/15   Tanna Furry, MD  doxycycline (VIBRAMYCIN) 100 MG capsule Take 1 capsule (100 mg total) by mouth 2 (two) times daily. Patient not taking: Reported on 06/13/2015 05/25/15   Tanna Furry, MD  predniSONE (DELTASONE) 20 MG tablet Take 1 tablet (20 mg total) by mouth 2 (two) times daily with a meal. Patient not taking: Reported on 06/25/2015 05/25/15   Tanna Furry, MD    Physical Exam: Filed Vitals:   06/25/15 2300 06/25/15 2320 06/25/15 2340 06/26/15 0000  BP: 141/72 118/62 168/71 139/77  Pulse: 126 126 136 127  Temp:      TempSrc:      Resp: '22 22 17 20  '$ Weight:      SpO2: 94% 95% 94% 93%    General: Alert, Awake and Oriented to Time, Place and Person. Appear in moderate distress, talking in full sentences Eyes: PERRL ENT: Oral Mucosa clear moist. Neck: no JVD Cardiovascular: S1 and S2 Present, no Murmur, Peripheral Pulses Present Respiratory: Bilateral Air entry equal and Decreased,  no Crackles, bilateral extensive expiratory as well as inspiratory wheezes Abdomen: Bowel Sound present, Soft and no tenderness Skin: no Rash Extremities: no Pedal edema, no calf tenderness Neurologic: Grossly no focal neuro  deficit.  Labs on Admission:  CBC:  Recent Labs Lab 06/25/15 2001  WBC 7.0  HGB 13.7  HCT 40.3  MCV 81.6  PLT 354    CMP     Component Value Date/Time   NA 138 06/25/2015 2001   NA 141 06/13/2015 1135   K 3.6 06/25/2015 2001   K 3.8 06/13/2015 1135   CL 101 06/25/2015 2001   CL 101 01/12/2013 0810   CO2 26 06/25/2015 2001   CO2 28 06/13/2015 1135   GLUCOSE 128* 06/25/2015 2001   GLUCOSE 67* 06/13/2015 1135   GLUCOSE 95 01/12/2013 0810   BUN 12 06/25/2015 2001   BUN 10.2 06/13/2015 1135   CREATININE 0.93 06/25/2015 2001   CREATININE 0.8 06/13/2015 1135   CALCIUM 9.4 06/25/2015 2001   CALCIUM 9.3 06/13/2015 1135   PROT 6.8 06/13/2015 1135   PROT 7.9 06/24/2014 2111   ALBUMIN 3.6 06/13/2015 1135   ALBUMIN 3.3* 06/24/2014 2111   AST 14 06/13/2015 1135  AST 14 06/24/2014 2111   ALT 14 06/13/2015 1135   ALT 7 06/24/2014 2111   ALKPHOS 66 06/13/2015 1135   ALKPHOS 75 06/24/2014 2111   BILITOT 0.71 06/13/2015 1135   BILITOT 0.4 06/24/2014 2111   GFRNONAA 59* 06/25/2015 2001   GFRAA >60 06/25/2015 2001    No results for input(s): LIPASE, AMYLASE in the last 168 hours.  No results for input(s): CKTOTAL, CKMB, CKMBINDEX, TROPONINI in the last 168 hours. BNP (last 3 results)  Recent Labs  04/21/15 2316  BNP 116.7*    ProBNP (last 3 results) No results for input(s): PROBNP in the last 8760 hours.   Radiological Exams on Admission: Dg Chest Port 1 View  06/25/2015   CLINICAL DATA:  Shortness of breath for 2 days. History of left lung cancer and COPD.  EXAM: PORTABLE CHEST - 1 VIEW  COMPARISON:  06/22/2015 chest CT.  FINDINGS: Emphysema. Left suprahilar and right perihilar opacity as shown on recent chest CT due to right middle lobe nodule and left suprahilar radiation fibrosis. 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  Power injectable Port-A-Cath tip: SVC. Heart size within normal  limits.  Atherosclerotic aortic arch.  IMPRESSION: 1. Relatively stable appearance of the chest, with emphysema ; a right middle lobe nodule causing the right hilar prominence; left paramediastinal fibrosis along the AP window causing density in this vicinity; and convex right upper mediastinal contour due to a known mass in the mediastinum.   Electronically Signed   By: Van Clines M.D.   On: 06/25/2015 20:44   EKG: Independently reviewed. sinus tachycardia.  Assessment/Plan Principal Problem:   COPD exacerbation Active Problems:   Lung cancer   Anemia   Acute respiratory failure with hypoxia   1. COPD exacerbation The patient is presenting with complaints of shortness of breath starting worsened today. She does not have any chest pain at time of my evaluation. She appears in mild-to-moderate respiratory distress. ABG shows mild hypoxia. No hypercarbia. With this the patient will be treated with Solu-Medrol 60 mg every 6 hours, levofloxacin, duo nebs every 4 hours. We will monitor her on telemetry. Pulse oximetry continuous as well.  2. Lung cancer. At present on outpatient chemotherapy. Recent CT scan on 06/22/2015 shows decrease in the size of the right middle lobe perihilar nodule. Although PE cannot rule out patient's examination with extensive expiratory wheezing as well as no evidence of pedal edema favors more COPD.  3. Sinus tachycardia, respiratory distress. Patient's ABG does not show any evidence of hypercarbia. At the time of my evaluation patient did not appear in any significant respiratory distress as well. But intermittently, the patient becomes tachycardic with respiratory distress and accessary muscle use, as per respiratory therapist and therefore patient will be closely monitored in stepdown units based on those concerns. BiPAP ordered when necessary.  Advance goals of care discussion: DNR/DNI as per my discussion, discussed with patient whether she would  be willing to do use BiPAP/NIV, she mentions having never used it, but she will see if the need arises.  DVT Prophylaxis: subcutaneous Heparin Nutrition: Regular diet  Family Communication: family was present at bedside, opportunity was given to ask question and all questions were answered satisfactorily at the time of interview. Disposition: Admitted as inpatient, step-down unit.  Author: Berle Mull, MD [facility] Pager: [phone] 06/25/2015  If 7PM-7AM, please contact night-coverage [email or web address] Password [email or web address]

## 2015-06-26 NOTE — Progress Notes (Signed)
Received transfer pt from ICU, VS obtained, telemetry applied, oriented to unit, call bell placed within reach

## 2015-06-26 NOTE — Progress Notes (Signed)
ANTIBIOTIC CONSULT NOTE - INITIAL  Pharmacy Consult for Levofloxacin Indication: COPD  Allergies  Allergen Reactions  . Shellfish Allergy Swelling    Patient Measurements: Height: '5\' 5"'$  (165.1 cm) Weight: 124 lb 5.4 oz (56.4 kg) IBW/kg (Calculated) : 57 Adjusted Body Weight:   Vital Signs: Temp: 97.9 F (36.6 C) (09/05 0444) Temp Source: Oral (09/05 0444) BP: 145/63 mmHg (09/05 0348) Pulse Rate: 116 (09/05 0348) Intake/Output from previous day:   Intake/Output from this shift:    Labs:  Recent Labs  06/25/15 2001 06/26/15 0334  WBC 7.0 6.4  HGB 13.7 12.7  PLT 354 284  CREATININE 0.93 0.88   Estimated Creatinine Clearance: 49.2 mL/min (by C-G formula based on Cr of 0.88). No results for input(s): VANCOTROUGH, VANCOPEAK, VANCORANDOM, GENTTROUGH, GENTPEAK, GENTRANDOM, TOBRATROUGH, TOBRAPEAK, TOBRARND, AMIKACINPEAK, AMIKACINTROU, AMIKACIN in the last 72 hours.   Microbiology: Recent Results (from the past 720 hour(s))  MRSA PCR Screening     Status: None   Collection Time: 06/26/15 12:57 AM  Result Value Ref Range Status   MRSA by PCR NEGATIVE NEGATIVE Final    Comment:        The GeneXpert MRSA Assay (FDA approved for NASAL specimens only), is one component of a comprehensive MRSA colonization surveillance program. It is not intended to diagnose MRSA infection nor to guide or monitor treatment for MRSA infections.     Medical History: Past Medical History  Diagnosis Date  . COPD (chronic obstructive pulmonary disease)   . Neutropenia, drug-induced 05/05/2012  . Hyperlipidemia   . Rheumatoid arthritis(714.0)   . Asthma   . Hiatal hernia   . History of chemotherapy   . History of radiation therapy 03/06/2012    left hilum  . History of radiation therapy 05/10/2013-05/31/2013    Left lung/ 33/75'@2'$ .25 per fraction x 15 fractions  . Radiation 11/15/13-11/26/13    Right hilum 30 Gy in 10 fractions  . Non-small cell lung cancer dx'd 08/28/11    left lung  .  Encounter for antineoplastic immunotherapy 05/22/2015  . DNR (do not resuscitate) 06/26/2015    Medications:  Anti-infectives    Start     Dose/Rate Route Frequency Ordered Stop   06/26/15 2200  levofloxacin (LEVAQUIN) IVPB 750 mg     750 mg 100 mL/hr over 90 Minutes Intravenous Every 24 hours 06/26/15 0613     06/25/15 2145  levofloxacin (LEVAQUIN) IVPB 750 mg     750 mg 100 mL/hr over 90 Minutes Intravenous  Once 06/25/15 2142 06/25/15 2335     Assessment: Patient with COPD.  First dose of antibiotics already given   Goal of Therapy:  levofloxacin dosed based on patient weight and renal function   Plan:  Follow up culture results  Levofloxacin '750mg'$  iv q24hr  Nani Skillern Crowford 06/26/2015,6:13 AM

## 2015-06-26 NOTE — ED Notes (Signed)
Attempted to call report. 2nd floor not ready awaiting call back.

## 2015-06-26 NOTE — ED Notes (Signed)
MD Patel at bedside.

## 2015-06-26 NOTE — Progress Notes (Signed)
Pt transferred to [id number] with all belongings. Patient notified family of new room. Report given to [place], RN and all questions answered.

## 2015-06-26 NOTE — Progress Notes (Addendum)
ANTICOAGULATION CONSULT NOTE - Initial Consult  Pharmacy Consult for Lovenox Indication: VTE treatment, possible PE  Allergies  Allergen Reactions  . Shellfish Allergy Swelling    Patient Measurements: Height: '5\' 5"'$  (165.1 cm) Weight: 124 lb 5.4 oz (56.4 kg) IBW/kg (Calculated) : 57 Heparin Dosing Weight:   Vital Signs: Temp: 98.3 F (36.8 C) (09/05 0800) Temp Source: Oral (09/05 0800) BP: 128/85 mmHg (09/05 1219) Pulse Rate: 108 (09/05 1219)  Labs:  Recent Labs  06/25/15 2001 06/26/15 0334  HGB 13.7 12.7  HCT 40.3 37.7  PLT 354 284  CREATININE 0.93 0.88    Estimated Creatinine Clearance: 49.2 mL/min (by C-G formula based on Cr of 0.88).   Medical History: Past Medical History  Diagnosis Date  . COPD (chronic obstructive pulmonary disease)   . Neutropenia, drug-induced 05/05/2012  . Hyperlipidemia   . Rheumatoid arthritis(714.0)   . Asthma   . Hiatal hernia   . History of chemotherapy   . History of radiation therapy 03/06/2012    left hilum  . History of radiation therapy 05/10/2013-05/31/2013    Left lung/ 33/75'@2'$ .25 per fraction x 15 fractions  . Radiation 11/15/13-11/26/13    Right hilum 30 Gy in 10 fractions  . Non-small cell lung cancer dx'd 08/28/11    left lung  . Encounter for antineoplastic immunotherapy 05/22/2015  . DNR (do not resuscitate) 06/26/2015    Assessment: 66 yoF on hx NSCLC on chemotherapy and COPD presents with SOB, sinus tachycardia, and respiratory distress.  High pretest probability for PE but unable to perform VQ scan until tomorrow.  Pharmacy consulted to start full dose lovenox.  No anticoagulants or antiplatelets noted PTA.  Wt = 56.4kg.  CrCl >30 ml/in.  No issues with CBC. Note Lovenox '40mg'$  administered at 9/5 0130.   Goal of Therapy:  Anti-Xa level 0.6-1 units/ml 4hrs after LMWH dose given Monitor platelets by anticoagulation protocol: Yes   Plan:  Start lovenox '1mg'$ /kg q12h.  Follow up results of VQ scan.    Ralene Bathe,  PharmD, BCPS 06/26/2015, 12:31 PM  Pager: [phone]

## 2015-06-26 NOTE — Progress Notes (Addendum)
[facility] PROGRESS NOTE  Destiny Harrison WUX:[id number] DOB: Jan 21, 1941 DOA: 06/25/2015 PCP: Thressa Sheller, MD  Assessment/Plan: Principal Problem:   COPD exacerbation Active Problems:   Lung cancer   Anemia   Respiratory distress   Acute respiratory failure with hypoxia   DNR (do not resuscitate)   Sinus tachycardia   1. COPD exacerbation Improved overnight, continue current treatment ABG shows mild hypoxia.No hypercarbia. Reduced dose of Solu-Medrol 60 mg every 12 hours, change levofloxacin to PO, duo nebs every 4 hours. Transfer patient to telemetry  2. Lung cancer. At present on outpatient chemotherapy. Recent CT scan on 06/22/2015 shows decrease in the size of the right middle lobe perihilar nodule. High pretest probability for PE, therefore start patient on full dose Lovenox in order a VQ scan for tomorrow, venous Doppler to rule out DVT Patient is due for chemotherapy tomorrow, will notify oncology for morning   3. Sinus tachycardia, respiratory distress. Rule out PE, could also be secondary to nebulizer treatments, monitor on telemetry    Code Status:      Code Status Orders        Start     Ordered   06/26/15 0014  Do not attempt resuscitation (DNR)   Continuous    Question Answer Comment  In the event of cardiac or respiratory ARREST Do not call a "code blue"   In the event of cardiac or respiratory ARREST Do not perform Intubation, CPR, defibrillation or ACLS   In the event of cardiac or respiratory ARREST Use medication by any route, position, wound care, and other measures to relive pain and suffering. May use oxygen, suction and manual treatment of airway obstruction as needed for comfort.      06/26/15 0013    Advance Directive Documentation        Most Recent Value   Type of Advance Directive  Living will   Pre-existing out of facility DNR order (yellow form or pink MOST form)     "MOST" Form in Place?       Family Communication:  family updated about patient's clinical progress Disposition Plan:  Transfer to telemetry    Brief narrative: Destiny Harrison is a 74 y.o. female with Past medical history of COPD, non-small cell lung cancer of the left lung, radiation therapy, history of chemotherapy, anemia. The patient is presenting with complaints of worsening shortness of breath starting today. She has baseline shortness of breath but starting today at 2 PM she started having worsening of that. She also mentions that she has been feeling diaphoretic and warm. Started having some chest tightness on deep breathing. Next and denies any chest pain or chest tightness the time of my evaluation. Patient denies having any fever or chills. She does have some cough without any expectoration. Denies having any choking episodes or recent travel recent surgery recent procedures recent leg swelling. Denies having any diarrhea constipation abdominal pain burning urination. She is currently on chemotherapy Nivolumab, last treatment was on 06/13/2015.   Consultants:  None  Procedures:  None  Antibiotics: Anti-infectives    Start     Dose/Rate Route Frequency Ordered Stop   06/26/15 2200  levofloxacin (LEVAQUIN) IVPB 750 mg  Status:  Discontinued     750 mg 100 mL/hr over 90 Minutes Intravenous Every 24 hours 06/26/15 0613 06/26/15 1105   06/26/15 1115  levofloxacin (LEVAQUIN) tablet 750 mg     750 mg Oral Daily 06/26/15 1105     06/25/15 2145  levofloxacin (LEVAQUIN) IVPB 750 mg     750 mg 100 mL/hr over 90 Minutes Intravenous  Once 06/25/15 2142 06/25/15 2335         HPI/Subjective: Patient feels better than yesterday, still tachycardic at rest heart rate in the 120s  Objective: Filed Vitals:   06/26/15 0500 06/26/15 0716 06/26/15 0724 06/26/15 0800  BP:   138/83   Pulse: 109 108 103   Temp:    98.3 F (36.8 C)  TempSrc:    Oral  Resp: '19 20 13   '$ Height:      Weight:      SpO2: 95% 94% 95%     Intake/Output  Summary (Last 24 hours) at 06/26/15 1105 Last data filed at 06/26/15 0840  Gross per 24 hour  Intake    480 ml  Output    225 ml  Net    255 ml    Exam:  General: No acute respiratory distress Lungs: Clear to auscultation bilaterally without wheezes or crackles Cardiovascular: Regular rate and rhythm without murmur gallop or rub normal S1 and S2 Abdomen: Nontender, nondistended, soft, bowel sounds positive, no rebound, no ascites, no appreciable mass Extremities: No significant cyanosis, clubbing, or edema bilateral lower extremities     Data Review   Micro Results Recent Results (from the past 240 hour(s))  MRSA PCR Screening     Status: None   Collection Time: 06/26/15 12:57 AM  Result Value Ref Range Status   MRSA by PCR NEGATIVE NEGATIVE Final    Comment:        The GeneXpert MRSA Assay (FDA approved for NASAL specimens only), is one component of a comprehensive MRSA colonization surveillance program. It is not intended to diagnose MRSA infection nor to guide or monitor treatment for MRSA infections.     Radiology Reports Ct Chest W Contrast  06/22/2015   CLINICAL DATA:  Patient with history of lung cancer. Restaging exam. On chemotherapy.  EXAM: CT CHEST WITH CONTRAST  TECHNIQUE: Multidetector CT imaging of the chest was performed during intravenous contrast administration.  CONTRAST:  15m OMNIPAQUE IOHEXOL 300 MG/ML  SOLN  COMPARISON:  Chest CT 04/27/2015  FINDINGS: Mediastinum/Nodes: Slight interval decrease in size of right supraclavicular lymph node measuring 8 mm, previously 11 mm (image 33; series 2). No significant interval change in size of 3.4 x 2.4 cm mediastinal mass within the high right peritracheal location (image 14; series 2), previously measuring 3.4 x 2.3 cm. Unchanged 1.3 cm subcarinal lymph node (image 22; series 2), previously 1.3 cm. Normal heart size. No pericardial effusion. Coronary arterial vascular calcifications. Aorta and main pulmonary  artery normal in caliber. No axillary lymphadenopathy. Right anterior chest wall Port-A-Cath is present with tip terminating in the superior vena cava.  Lungs/Pleura: Interval decrease in size of right middle lobe parahilar mass measuring 2.0 x 2.2 cm, previously 2.7 x 2.0 cm (image 27; series 2). No other suspicious pulmonary nodules or masses identified. Grossly stable left perihilar architectural distortion predominately involving the left upper lobe compatible with post radiation fibrosis. Centrilobular and paraseptal emphysematous change. Biapical pleural parenchymal thickening. No pleural effusion or pneumothorax.  Upper abdomen: Liver is normal in size and contour. Normal adrenal glands.  Musculoskeletal: No aggressive or acute appearing osseous lesions. Re- demonstrated chronic compression fracture of the T4 vertebral body with approximate 30% loss of height.  IMPRESSION: Interval decrease in size of right middle lobe perihilar nodule as well as interval decrease in size of right supraclavicular lymph node.  Additional mediastinal lymph nodes are grossly stable when compared to prior exam.  Re- demonstrated diffuse bronchial wall thickening with centrilobular and paraseptal emphysematous change.  Stable postradiation change left lung.   Electronically Signed   By: Lovey Newcomer M.D.   On: 06/22/2015 13:56   Dg Chest Port 1 View  06/25/2015   CLINICAL DATA:  Shortness of breath for 2 days. History of left lung cancer and COPD.  EXAM: PORTABLE CHEST - 1 VIEW  COMPARISON:  06/22/2015 chest CT.  FINDINGS: Emphysema. Left suprahilar and right perihilar opacity as shown on recent chest CT due to right middle lobe nodule and left suprahilar radiation fibrosis. 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  Power injectable Port-A-Cath tip: SVC. Heart size within normal limits.  Atherosclerotic aortic arch.  IMPRESSION: 1. Relatively stable  appearance of the chest, with emphysema ; a right middle lobe nodule causing the right hilar prominence; left paramediastinal fibrosis along the AP window causing density in this vicinity; and convex right upper mediastinal contour due to a known mass in the mediastinum.   Electronically Signed   By: Van Clines M.D.   On: 06/25/2015 20:44     CBC  Recent Labs Lab 06/25/15 2001 06/26/15 0334  WBC 7.0 6.4  HGB 13.7 12.7  HCT 40.3 37.7  PLT 354 284  MCV 81.6 82.1  MCH 27.7 27.7  MCHC 34.0 33.7  RDW 15.0 15.0    Chemistries   Recent Labs Lab 06/25/15 2001 06/26/15 0334  NA 138 136  K 3.6 3.4*  CL 101 100*  CO2 26 26  GLUCOSE 128* 198*  BUN 12 13  CREATININE 0.93 0.88  CALCIUM 9.4 9.4  AST  --  23  ALT  --  16  ALKPHOS  --  64  BILITOT  --  0.8   ------------------------------------------------------------------------------------------------------------------ estimated creatinine clearance is 49.2 mL/min (by C-G formula based on Cr of 0.88). ------------------------------------------------------------------------------------------------------------------ No results for input(s): HGBA1C in the last 72 hours. ------------------------------------------------------------------------------------------------------------------ No results for input(s): CHOL, HDL, LDLCALC, TRIG, CHOLHDL, LDLDIRECT in the last 72 hours. ------------------------------------------------------------------------------------------------------------------ No results for input(s): TSH, T4TOTAL, T3FREE, THYROIDAB in the last 72 hours.  Invalid input(s): FREET3 ------------------------------------------------------------------------------------------------------------------ No results for input(s): VITAMINB12, FOLATE, FERRITIN, TIBC, IRON, RETICCTPCT in the last 72 hours.  Coagulation profile No results for input(s): INR, PROTIME in the last 168 hours.   Recent Labs  06/26/15 0815  DDIMER  1.92*    Cardiac Enzymes No results for input(s): CKMB, TROPONINI, MYOGLOBIN in the last 168 hours.  Invalid input(s): CK ------------------------------------------------------------------------------------------------------------------ Invalid input(s): POCBNP   CBG: No results for input(s): GLUCAP in the last 168 hours.     Studies: Dg Chest Port 1 View  06/25/2015   CLINICAL DATA:  Shortness of breath for 2 days. History of left lung cancer and COPD.  EXAM: PORTABLE CHEST - 1 VIEW  COMPARISON:  06/22/2015 chest CT.  FINDINGS: Emphysema. Left suprahilar and right perihilar opacity as shown on recent chest CT due to right middle lobe nodule and left suprahilar radiation fibrosis. 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  Power injectable Port-A-Cath tip: SVC. Heart size within normal limits.  Atherosclerotic aortic arch.  IMPRESSION: 1. Relatively stable appearance of the chest, with emphysema ; a right middle lobe nodule causing the right hilar prominence; left paramediastinal fibrosis along the AP  window causing density in this vicinity; and convex right upper mediastinal contour due to a known mass in the mediastinum.   Electronically Signed   By: Van Clines M.D.   On: 06/25/2015 20:44      No results found for: HGBA1C Lab Results  Component Value Date   CREATININE 0.88 06/26/2015       Scheduled Meds: . folic acid  2,080 mcg Oral Daily  . guaiFENesin  600 mg Oral BID  . ipratropium-albuterol  3 mL Nebulization Q4H  . levofloxacin  750 mg Oral Daily  . methylPREDNISolone (SOLU-MEDROL) injection  60 mg Intravenous Q12H  . potassium chloride  40 mEq Oral Once  . simvastatin  5 mg Oral QHS  . sodium chloride  3 mL Intravenous Q12H   Continuous Infusions:   Principal Problem:   COPD exacerbation Active Problems:   Lung cancer   Anemia   Respiratory distress   Acute respiratory  failure with hypoxia   DNR (do not resuscitate)   Sinus tachycardia    Time spent: 45 minutes   [facility] Pager [phone]. If 7PM-7AM, please contact night-coverage at [email or web address], password [facility] 06/26/2015, 11:05 AM  LOS: 1 day

## 2015-06-27 ENCOUNTER — Ambulatory Visit: Payer: Medicare Other | Admitting: Physician Assistant

## 2015-06-27 ENCOUNTER — Ambulatory Visit: Payer: Medicare Other

## 2015-06-27 ENCOUNTER — Inpatient Hospital Stay (HOSPITAL_COMMUNITY): Payer: Medicare Other

## 2015-06-27 ENCOUNTER — Other Ambulatory Visit: Payer: Medicare Other

## 2015-06-27 DIAGNOSIS — J80 Acute respiratory distress syndrome: Secondary | ICD-10-CM

## 2015-06-27 LAB — CBC
HCT: 36.9 % (ref 36.0–46.0)
HEMOGLOBIN: 12.2 g/dL (ref 12.0–15.0)
MCH: 27.2 pg (ref 26.0–34.0)
MCHC: 33.1 g/dL (ref 30.0–36.0)
MCV: 82.2 fL (ref 78.0–100.0)
PLATELETS: 300 10*3/uL (ref 150–400)
RBC: 4.49 MIL/uL (ref 3.87–5.11)
RDW: 15.5 % (ref 11.5–15.5)
WBC: 15 10*3/uL — ABNORMAL HIGH (ref 4.0–10.5)

## 2015-06-27 MED ORDER — GUAIFENESIN ER 600 MG PO TB12: 600.0000 mg | ORAL_TABLET | Freq: Two times a day (BID) | ORAL | Status: DC

## 2015-06-27 MED ORDER — LEVOFLOXACIN 750 MG PO TABS: 750.0000 mg | ORAL_TABLET | Freq: Every day | ORAL | Status: DC

## 2015-06-27 MED ORDER — TECHNETIUM TO 99M ALBUMIN AGGREGATED
6.3000 | Freq: Once | INTRAVENOUS | Status: AC | PRN
  Administered 2015-06-27: 6.3 via INTRAVENOUS

## 2015-06-27 MED ORDER — PREDNISONE 10 MG (21) PO TBPK: ORAL_TABLET | ORAL | Status: DC

## 2015-06-27 MED ORDER — TECHNETIUM TC 99M DIETHYLENETRIAME-PENTAACETIC ACID: 41.6000 | Freq: Once | INTRAVENOUS | Status: DC | PRN

## 2015-06-27 NOTE — Discharge Summary (Signed)
Physician Discharge Summary  Destiny Harrison MRN: [id number] DOB/AGE: Sep 22, 1941 74 y.o.  PCP: Thressa Sheller, MD   Admit date: 06/25/2015 Discharge date: 06/27/2015  Discharge Diagnoses:   Principal Problem:   COPD exacerbation Active Problems:   Lung cancer   Anemia   Respiratory distress   Acute respiratory failure with hypoxia   DNR (do not resuscitate)   Sinus tachycardia    Follow-up recommendations Follow-up with PCP in 3-5 days , including all  additional recommended appointments as below Follow-up CBC, CMP in 3-5 days      Medication List    STOP taking these medications        azithromycin 250 MG tablet  Commonly known as:  ZITHROMAX     doxycycline 100 MG capsule  Commonly known as:  VIBRAMYCIN     predniSONE 20 MG tablet  Commonly known as:  DELTASONE  Replaced by:  predniSONE 10 MG (21) Tbpk tablet     predniSONE 5 MG tablet  Commonly known as:  DELTASONE      TAKE these medications        albuterol (2.5 MG/3ML) 0.083% nebulizer solution  Commonly known as:  PROVENTIL  Take 2.5 mg by nebulization every 8 (eight) hours.     albuterol 108 (90 BASE) MCG/ACT inhaler  Commonly known as:  VENTOLIN HFA  Inhale 2 puffs into the lungs every 3 (three) hours as needed for wheezing or shortness of breath.     benzonatate 100 MG capsule  Commonly known as:  TESSALON  Take 1 capsule (100 mg total) by mouth every 8 (eight) hours.     calcium-vitamin D 500-200 MG-UNIT per tablet  Commonly known as:  OSCAL WITH D  Take 1 tablet by mouth daily.     folic acid 563 MCG tablet  Commonly known as:  FOLVITE  Take 400 mcg by mouth daily.     guaiFENesin 600 MG 12 hr tablet  Commonly known as:  MUCINEX  Take 1 tablet (600 mg total) by mouth 2 (two) times daily.     HYDROcodone-acetaminophen 5-325 MG per tablet  Commonly known as:  NORCO/VICODIN  Take 1 tablet by mouth every 4 (four) hours as needed for moderate pain.     ipratropium 0.02 % nebulizer  solution  Commonly known as:  ATROVENT  Take 0.5 mg by nebulization every 8 (eight) hours.     levofloxacin 750 MG tablet  Commonly known as:  LEVAQUIN  Take 1 tablet (750 mg total) by mouth daily at 8 pm.     ondansetron 8 MG disintegrating tablet  Commonly known as:  ZOFRAN ODT  Take 1 tablet (8 mg total) by mouth every 8 (eight) hours as needed for nausea or vomiting.     predniSONE 10 MG (21) Tbpk tablet  Commonly known as:  STERAPRED UNI-PAK 21 TAB  6 tablets for 4 days, 5 tablets for 4 days, 4 tablets for 4 days, 3 tablets for 4 days, 2 tablets for 4 days, 1 tablet for 4 days then discontinue     prochlorperazine 10 MG tablet  Commonly known as:  COMPAZINE  Take 1 tablet (10 mg total) by mouth every 6 (six) hours as needed for nausea or vomiting.     simvastatin 5 MG tablet  Commonly known as:  ZOCOR  Take 5 mg by mouth at bedtime.         Discharge Condition: *Stable  Disposition: 01-Home or Self Care   Consults: None  Significant Diagnostic Studies:  Ct Chest W Contrast  06/22/2015   CLINICAL DATA:  Patient with history of lung cancer. Restaging exam. On chemotherapy.  EXAM: CT CHEST WITH CONTRAST  TECHNIQUE: Multidetector CT imaging of the chest was performed during intravenous contrast administration.  CONTRAST:  52m OMNIPAQUE IOHEXOL 300 MG/ML  SOLN  COMPARISON:  Chest CT 04/27/2015  FINDINGS: Mediastinum/Nodes: Slight interval decrease in size of right supraclavicular lymph node measuring 8 mm, previously 11 mm (image 33; series 2). No significant interval change in size of 3.4 x 2.4 cm mediastinal mass within the high right peritracheal location (image 14; series 2), previously measuring 3.4 x 2.3 cm. Unchanged 1.3 cm subcarinal lymph node (image 22; series 2), previously 1.3 cm. Normal heart size. No pericardial effusion. Coronary arterial vascular calcifications. Aorta and main pulmonary artery normal in caliber. No axillary lymphadenopathy. Right anterior chest  wall Port-A-Cath is present with tip terminating in the superior vena cava.  Lungs/Pleura: Interval decrease in size of right middle lobe parahilar mass measuring 2.0 x 2.2 cm, previously 2.7 x 2.0 cm (image 27; series 2). No other suspicious pulmonary nodules or masses identified. Grossly stable left perihilar architectural distortion predominately involving the left upper lobe compatible with post radiation fibrosis. Centrilobular and paraseptal emphysematous change. Biapical pleural parenchymal thickening. No pleural effusion or pneumothorax.  Upper abdomen: Liver is normal in size and contour. Normal adrenal glands.  Musculoskeletal: No aggressive or acute appearing osseous lesions. Re- demonstrated chronic compression fracture of the T4 vertebral body with approximate 30% loss of height.  IMPRESSION: Interval decrease in size of right middle lobe perihilar nodule as well as interval decrease in size of right supraclavicular lymph node. Additional mediastinal lymph nodes are grossly stable when compared to prior exam.  Re- demonstrated diffuse bronchial wall thickening with centrilobular and paraseptal emphysematous change.  Stable postradiation change left lung.   Electronically Signed   By: DLovey NewcomerM.D.   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 Patient began immunotherapy on 05/22/2015.  EXAM: NUCLEAR MEDICINE VENTILATION - PERFUSION LUNG SCAN  TECHNIQUE: Ventilation images were obtained in multiple projections using inhaled aerosol Tc-958mTPA. Perfusion images were obtained in multiple projections after intravenous injection of Tc-9983mA.  RADIOPHARMACEUTICALS:  41.6 Technetium-75m40mA aerosol inhalation and 6.3 Technetium-75m 32mIV   COMPARISON:  No prior nuclear imaging studies. Multiple prior CT chest examinations, most recently 02/09/2015. Portable chest x-ray 06/25/2015.  FINDINGS: Ventilation: Central deposition of the aerosol accounting for the hot spots oral centrally in both lungs. Subdiaphragmatic activity due to oral ingestion of the aerosol with gastric activity.  Ventilatory defect involving the right middle lobe. Symmetric mild diminished ventilation involving the upper lobes. No other focal ventilatory abnormalities.  Perfusion: Symmetric diminished activity in the upper lobes bilaterally related to the emphysema identified on prior imaging. Matched perfusion defect in the right middle lobe, corresponding to the previously identified atelectasis due to the right middle lobe tumor. No wedge-shaped perfusion defects elsewhere in either lung.  IMPRESSION: Low probability for pulmonary embolism according to PIOPED II criteria.   Electronically Signed   By: ThomaEvangeline Dakin   On: 06/27/2015 11:41   Dg Chest Port 1 View  06/25/2015   CLINICAL DATA:  Shortness of breath for  2 days. History of left lung cancer and COPD.  EXAM: PORTABLE CHEST - 1 VIEW  COMPARISON:  06/22/2015 chest CT.  FINDINGS: Emphysema. Left suprahilar and right perihilar opacity as shown on recent chest CT due to right middle lobe nodule and left suprahilar radiation fibrosis. 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  Power injectable Port-A-Cath tip: SVC. Heart size within normal limits.  Atherosclerotic aortic arch.  IMPRESSION: 1. Relatively stable appearance of the chest, with emphysema ; a right middle lobe nodule causing the right hilar prominence; left paramediastinal fibrosis along the AP window causing density in this vicinity; and convex right upper mediastinal contour due to a known mass in the mediastinum.   Electronically Signed   By: Van Clines M.D.   On:  06/25/2015 20:44       Filed Weights   06/25/15 1956 06/26/15 0100 06/27/15 0458  Weight: 58.06 kg (128 lb) 56.4 kg (124 lb 5.4 oz) 57.4 kg (126 lb 8.7 oz)     Microbiology: Recent Results (from the past 240 hour(s))  MRSA PCR Screening     Status: None   Collection Time: 06/26/15 12:57 AM  Result Value Ref Range Status   MRSA by PCR NEGATIVE NEGATIVE Final    Comment:        The GeneXpert MRSA Assay (FDA approved for NASAL specimens only), is one component of a comprehensive MRSA colonization surveillance program. It is not intended to diagnose MRSA infection nor to guide or monitor treatment for MRSA infections.        Blood Culture    Component Value Date/Time   SDES URINE, RANDOM 06/24/2014 2242   SPECREQUEST Immunocompromised 06/24/2014 2242   CULT  06/24/2014 2242    ESCHERICHIA COLI Performed at [facility] 06/27/2014 FINAL 06/24/2014 2242      Labs: Results for orders placed or performed during the hospital encounter of 06/25/15 (from the past 48 hour(s))  Basic metabolic panel     Status: Abnormal   Collection Time: 06/25/15  8:01 PM  Result Value Ref Range   Sodium 138 135 - 145 mmol/L   Potassium 3.6 3.5 - 5.1 mmol/L   Chloride 101 101 - 111 mmol/L   CO2 26 22 - 32 mmol/L   Glucose, Bld 128 (H) 65 - 99 mg/dL   BUN 12 6 - 20 mg/dL   Creatinine, Ser 0.93 0.44 - 1.00 mg/dL   Calcium 9.4 8.9 - 10.3 mg/dL   GFR calc non Af Amer 59 (L) >60 mL/min   GFR calc Af Amer >60 >60 mL/min    Comment: (NOTE) The eGFR has been calculated using the CKD EPI equation. This calculation has not been validated in all clinical situations. eGFR's persistently <60 mL/min signify possible Chronic Kidney Disease.    Anion gap 11 5 - 15  CBC     Status: None   Collection Time: 06/25/15  8:01 PM  Result Value Ref Range   WBC 7.0 4.0 - 10.5 K/uL   RBC 4.94 3.87 - 5.11 MIL/uL   Hemoglobin 13.7 12.0 - 15.0 g/dL   HCT 40.3 36.0 - 46.0 %   MCV  81.6 78.0 - 100.0 fL   MCH 27.7 26.0 - 34.0 pg   MCHC 34.0 30.0 - 36.0 g/dL   RDW 15.0 11.5 - 15.5 %   Platelets 354 150 - 400 K/uL  Blood gas, arterial  Status: Abnormal   Collection Time: 06/25/15 11:15 PM  Result Value Ref Range   O2 Content 2.0 L/min   Delivery systems NASAL CANNULA    pH, Arterial 7.431 7.350 - 7.450   pCO2 arterial 36.7 35.0 - 45.0 mmHg   pO2, Arterial 70.2 (L) 80.0 - 100.0 mmHg   Bicarbonate 24.1 (H) 20.0 - 24.0 mEq/L   TCO2 21.5 0 - 100 mmol/L   Acid-Base Excess 0.4 0.0 - 2.0 mmol/L   O2 Saturation 92.7 %   Patient temperature 98.0    Collection site LEFT RADIAL    Drawn by [id number]    Sample type ARTERIAL    Allens test (pass/fail) PASS PASS  MRSA PCR Screening     Status: None   Collection Time: 06/26/15 12:57 AM  Result Value Ref Range   MRSA by PCR NEGATIVE NEGATIVE    Comment:        The GeneXpert MRSA Assay (FDA approved for NASAL specimens only), is one component of a comprehensive MRSA colonization surveillance program. It is not intended to diagnose MRSA infection nor to guide or monitor treatment for MRSA infections.   Comprehensive metabolic panel     Status: Abnormal   Collection Time: 06/26/15  3:34 AM  Result Value Ref Range   Sodium 136 135 - 145 mmol/L   Potassium 3.4 (L) 3.5 - 5.1 mmol/L   Chloride 100 (L) 101 - 111 mmol/L   CO2 26 22 - 32 mmol/L   Glucose, Bld 198 (H) 65 - 99 mg/dL   BUN 13 6 - 20 mg/dL   Creatinine, Ser 0.88 0.44 - 1.00 mg/dL   Calcium 9.4 8.9 - 10.3 mg/dL   Total Protein 7.3 6.5 - 8.1 g/dL   Albumin 3.9 3.5 - 5.0 g/dL   AST 23 15 - 41 U/L   ALT 16 14 - 54 U/L   Alkaline Phosphatase 64 38 - 126 U/L   Total Bilirubin 0.8 0.3 - 1.2 mg/dL   GFR calc non Af Amer >60 >60 mL/min   GFR calc Af Amer >60 >60 mL/min    Comment: (NOTE) The eGFR has been calculated using the CKD EPI equation. This calculation has not been validated in all clinical situations. eGFR's persistently <60 mL/min signify possible  Chronic Kidney Disease.    Anion gap 10 5 - 15  CBC     Status: None   Collection Time: 06/26/15  3:34 AM  Result Value Ref Range   WBC 6.4 4.0 - 10.5 K/uL   RBC 4.59 3.87 - 5.11 MIL/uL   Hemoglobin 12.7 12.0 - 15.0 g/dL   HCT 37.7 36.0 - 46.0 %   MCV 82.1 78.0 - 100.0 fL   MCH 27.7 26.0 - 34.0 pg   MCHC 33.7 30.0 - 36.0 g/dL   RDW 15.0 11.5 - 15.5 %   Platelets 284 150 - 400 K/uL  Influenza panel by PCR (type A & B, H1N1) (Not at [facility])     Status: None   Collection Time: 06/26/15  8:00 AM  Result Value Ref Range   Influenza A By PCR NEGATIVE NEGATIVE   Influenza B By PCR NEGATIVE NEGATIVE   H1N1 flu by pcr NOT DETECTED NOT DETECTED    Comment:        The Xpert Flu assay (FDA approved for nasal aspirates or washes and nasopharyngeal swab specimens), is intended as an aid in the diagnosis of influenza and should not be used as a sole basis for treatment.  Performed at [facility]   D-dimer, quantitative (not at [facility])     Status: Abnormal   Collection Time: 06/26/15  8:15 AM  Result Value Ref Range   D-Dimer, Quant 1.92 (H) 0.00 - 0.48 ug/mL-FEU    Comment:        AT THE INHOUSE ESTABLISHED CUTOFF VALUE OF 0.48 ug/mL FEU, THIS ASSAY HAS BEEN DOCUMENTED IN THE LITERATURE TO HAVE A SENSITIVITY AND NEGATIVE PREDICTIVE VALUE OF AT LEAST 98 TO 99%.  THE TEST RESULT SHOULD BE CORRELATED WITH AN ASSESSMENT OF THE CLINICAL PROBABILITY OF DVT / VTE.   CBC     Status: Abnormal   Collection Time: 06/27/15  5:05 AM  Result Value Ref Range   WBC 15.0 (H) 4.0 - 10.5 K/uL   RBC 4.49 3.87 - 5.11 MIL/uL   Hemoglobin 12.2 12.0 - 15.0 g/dL   HCT 36.9 36.0 - 46.0 %   MCV 82.2 78.0 - 100.0 fL   MCH 27.2 26.0 - 34.0 pg   MCHC 33.1 30.0 - 36.0 g/dL   RDW 15.5 11.5 - 15.5 %   Platelets 300 150 - 400 K/uL     Lipid Panel  No results found for: CHOL, TRIG, HDL, CHOLHDL, VLDL, LDLCALC, LDLDIRECT   No results found for: HGBA1C   Lab Results  Component Value Date    CREATININE 0.88 06/26/2015     HPI :Destiny Harrison is a 74 y.o. female with Past medical history of COPD, non-small cell lung cancer of the left lung, radiation therapy, history of chemotherapy, anemia. The patient is presenting with complaints of worsening shortness of breath starting today. She has baseline shortness of breath but starting today at 2 PM she started having worsening of that. She also mentions that she has been feeling diaphoretic and warm. Started having some chest tightness on deep breathing. Next and denies any chest pain or chest tightness the time of my evaluation. Patient denies having any fever or chills. She does have some cough without any expectoration. Denies having any choking episodes or recent travel recent surgery recent procedures recent leg swelling. Denies having any diarrhea constipation abdominal pain burning urination. She is currently on chemotherapy Nivolumab, last treatment was on 06/13/2015  HOSPITAL COURSE:    1. COPD exacerbation Improved overnight, continue to taper steroids in the outpatient setting, continue by mouth Levaquin ABG shows mild hypoxia.No hypercarbia. Reduced dose of Solu-Medrol 60 mg every 12 hours, change levofloxacin to PO, duo nebs every 4 hours. Anticipate discharge today, pending PT evaluation, may need home oxygen  2. Lung cancer. At present on outpatient chemotherapy. Recent CT scan on 06/22/2015 shows decrease in the size of the right middle lobe perihilar nodule. High pretest probability for PE, therefore empirically started on Lovenox which was discontinued as the patient's VQ scan and bilateral lower extremity venous Doppler was negative for PE and DVT respectively   3. Sinus tachycardia, respiratory distress. No evidence of PE, could also be secondary to nebulizer treatments, telemetry showed sinus tachycardia otherwise uneventful  Discharge Exam:   Blood pressure 112/64, pulse 72, temperature 97.5 F (36.4 C),  temperature source Oral, resp. rate 18, height _0  (1.651 m), weight 57.4 kg (126 lb 8.7 oz), SpO2 99 %. General: Alert, Awake and Oriented to Time, Place and Person. Appear in moderate distress, talking in full sentences Eyes: PERRL ENT: Oral Mucosa clear moist. Neck: no JVD Cardiovascular: S1 and S2 Present, no Murmur, Peripheral Pulses Present Respiratory: Bilateral Air entry equal and  Decreased,  no Crackles, bilateral extensive expiratory as well as inspiratory wheezes Abdomen: Bowel Sound present, Soft and no tenderness Skin: no Rash Extremities: no Pedal edema, no calf tenderness Neurologic: Grossly no focal neuro deficit.        Discharge Instructions    Diet - low sodium heart healthy    Complete by:  As directed      Increase activity slowly    Complete by:  As directed            Follow-up Information    Follow up with Thressa Sheller, MD. Schedule an appointment as soon as possible for a visit in 3 days.   Specialty:  Internal Medicine   Contact information:   [place], [address] [facility] [place]  [id number] [phone]       Signed: Reyne Dumas 06/27/2015, 12:30 PM        Time spent >45 mins

## 2015-06-27 NOTE — Progress Notes (Signed)
SATURATION QUALIFICATIONS: (This note is used to comply with regulatory documentation for home oxygen)  Patient Saturations on Room Air at Rest = 95%  Patient Saturations on Room Air while Ambulating = 87%  Patient Saturations on 2 Liters of oxygen while Ambulating = 93%  Please briefly explain why patient needs home oxygen: to maintain oxygen saturations above 88% during physical activity such as ambulation.  Carmelia Bake, PT, DPT 06/27/2015 Pager: [phone]

## 2015-06-27 NOTE — Care Management Important Message (Addendum)
Important Message  Patient Details  Name: Destiny Harrison MRN: [id number] Date of Birth: [birth year]   Medicare Important Message Given:   Yes    Dessa Phi, RN 06/27/2015, 3:16 PM

## 2015-06-27 NOTE — Progress Notes (Signed)
VASCULAR LAB PRELIMINARY  PRELIMINARY  PRELIMINARY  PRELIMINARY  Bilateral lower extremity venous duplex  completed.    Preliminary report:  Bilateral:  No evidence of DVT, superficial thrombosis, or Baker's Cyst.    Luan Maberry, RVT 06/27/2015, 11:59 AM

## 2015-06-27 NOTE — Progress Notes (Signed)
OT Cancellation Note  Patient Details Name: Destiny Harrison MRN: [id number] DOB: 19-Apr-1941   Cancelled Treatment:    Reason Eval/Treat Not Completed: Other (comment) Note order for dopplers to r/o DVT. Will await results. Will check at later time.  [place], [place] 06/27/2015, 11:47 AM

## 2015-06-27 NOTE — Evaluation (Signed)
Physical Therapy Evaluation Patient Details Name: Destiny Harrison MRN: [id number] DOB: Jan 29, 1941 Today's Date: 06/27/2015   History of Present Illness  74 y.o. female with Past medical history of COPD, non-small cell lung cancer of the left lung, radiation therapy, history of chemotherapy, anemia admitted for COPD exacerbation  Clinical Impression  Pt admitted with above diagnosis. Pt currently with functional limitations due to the deficits listed below (see PT Problem List).  Pt will benefit from skilled PT to increase their independence and safety with mobility to allow discharge to the venue listed below.  Pt assisted with ambulating in hallway and required 2L O2 [place] during ambulation (see saturation qualification progress note).  Pt reports her spouse and son can assist her at home if needed.  Pt's HR elevated to 150s during physical activity and with coughing (increased coughing during gait) however pt reports high HR for last 3 years (RN also notified by telemetry).     Follow Up Recommendations Home health PT    Equipment Recommendations  None recommended by PT    Recommendations for Other Services       Precautions / Restrictions Precautions Precautions: Fall Precaution Comments: monitor vitals      Mobility  Bed Mobility Overal bed mobility: Modified Independent                Transfers Overall transfer level: Needs assistance Equipment used: None Transfers: Sit to/from Stand Sit to Stand: Min guard            Ambulation/Gait Ambulation/Gait assistance: Min guard Ambulation Distance (Feet): 280 Feet Assistive device: None Gait Pattern/deviations: Step-through pattern     General Gait Details: slow pace however steady, pt reports no increased dyspnea, HR elevated into 150s (RN notified by telemetry) however pt reports elevated HR with coughing and activity for last 3 years, SPO2 dropped to 87% on room air so applied 2L O2 [place] which increased SpO2 to  93%  Stairs            Wheelchair Mobility    Modified Rankin (Stroke Patients Only)       Balance                                             Pertinent Vitals/Pain Pain Assessment: No/denies pain    Home Living Family/patient expects to be discharged to:: Private residence Living Arrangements: Spouse/significant other Available Help at Discharge: Family Type of Home: House       Home Layout: One level Home Equipment: Environmental consultant - 2 wheels      Prior Function Level of Independence: Independent               Hand Dominance        Extremity/Trunk Assessment               Lower Extremity Assessment: Generalized weakness         Communication   Communication: No difficulties  Cognition Arousal/Alertness: Awake/alert Behavior During Therapy: WFL for tasks assessed/performed Overall Cognitive Status: Within Functional Limits for tasks assessed                      General Comments      Exercises        Assessment/Plan    PT Assessment Patient needs continued PT services  PT Diagnosis Difficulty walking   PT Problem List  Cardiopulmonary status limiting activity;Decreased mobility;Decreased activity tolerance  PT Treatment Interventions DME instruction;Gait training;Therapeutic exercise;Therapeutic activities;Functional mobility training;Patient/family education   PT Goals (Current goals can be found in the Care Plan section) Acute Rehab PT Goals PT Goal Formulation: With patient Time For Goal Achievement: 07/04/15 Potential to Achieve Goals: Good    Frequency Min 3X/week   Barriers to discharge        Co-evaluation               End of Session Equipment Utilized During Treatment: Oxygen Activity Tolerance: Patient tolerated treatment well Patient left: in bed;with call bell/phone within reach           Time: 1321-1336 PT Time Calculation (min) (ACUTE ONLY): 15 min   Charges:   PT  Evaluation $Initial PT Evaluation Tier I: 1 Procedure     PT G Codes:        Jebediah Macrae,KATHrine E 06/27/2015, 3:19 PM Carmelia Bake, PT, DPT 06/27/2015 Pager: [phone]

## 2015-06-27 NOTE — Care Management Note (Signed)
Case Management Note  Patient Details  Name: Brynnlee Cumpian MRN: [id number] Date of Birth: May 04, 1941  Subjective/Objective:  Patient chose [facility] for [facility].TC [facility] rep Kristen aware of [place] orders, & d/c.02 sats qualified for home 02.home 02 ordered.[facility] dme rep Lecretia aware of d/c & orders.Patient/Nsg aware to wait until 02 in rm prior d/c.                  Action/Plan:d/c plan home w/HHC/DME.   Expected Discharge Date:                  Expected Discharge Plan:  Home/Self Care  In-House Referral:     Discharge planning Services  CM Consult  Post Acute Care Choice:    Choice offered to:  Patient  DME Arranged:  Oxygen DME Agency:  [place]:  RN, PT, OT, Nurse's Aide [place] Agency:  [place]  Status of Service:  Completed, signed off  Medicare Important Message Given:    Date Medicare IM Given:    Medicare IM give by:    Date Additional Medicare IM Given:    Additional Medicare Important Message give by:     If discussed at [place] of Stay Meetings, dates discussed:    Additional Comments:  Dessa Phi, RN 06/27/2015, 2:44 PM

## 2015-06-27 NOTE — Care Management Note (Signed)
Case Management Note  Patient Details  Name: Destiny Harrison MRN: [id number] Date of Birth: Mar 14, 1941  Subjective/Objective:   74 y/o f admitted w/COPD. From home.Noted 02 sats to be checked for qualifying status. Home 02 order would need to add whether continuous or prn if she qualifies. Nsg updated.[facility] dme rep following.                 Action/Plan:d/c plan home.   Expected Discharge Date:                  Expected Discharge Plan:  Home/Self Care  In-House Referral:     Discharge planning Services  CM Consult  Post Acute Care Choice:    Choice offered to:     DME Arranged:    DME Agency:     HH Arranged:    HH Agency:     Status of Service:  In process, will continue to follow  Medicare Important Message Given:    Date Medicare IM Given:    Medicare IM give by:    Date Additional Medicare IM Given:    Additional Medicare Important Message give by:     If discussed at [place] of Stay Meetings, dates discussed:    Additional Comments:  Dessa Phi, RN 06/27/2015, 1:14 PM

## 2015-06-29 DIAGNOSIS — Z66 Do not resuscitate: Secondary | ICD-10-CM | POA: Diagnosis not present

## 2015-06-29 DIAGNOSIS — M069 Rheumatoid arthritis, unspecified: Secondary | ICD-10-CM | POA: Diagnosis not present

## 2015-06-29 DIAGNOSIS — Z87891 Personal history of nicotine dependence: Secondary | ICD-10-CM | POA: Diagnosis not present

## 2015-06-29 DIAGNOSIS — J441 Chronic obstructive pulmonary disease with (acute) exacerbation: Secondary | ICD-10-CM | POA: Diagnosis not present

## 2015-06-29 DIAGNOSIS — J45909 Unspecified asthma, uncomplicated: Secondary | ICD-10-CM | POA: Diagnosis not present

## 2015-06-29 DIAGNOSIS — E785 Hyperlipidemia, unspecified: Secondary | ICD-10-CM | POA: Diagnosis not present

## 2015-06-29 DIAGNOSIS — C3492 Malignant neoplasm of unspecified part of left bronchus or lung: Secondary | ICD-10-CM | POA: Diagnosis not present

## 2015-06-29 DIAGNOSIS — D649 Anemia, unspecified: Secondary | ICD-10-CM | POA: Diagnosis not present

## 2015-07-10 DIAGNOSIS — J441 Chronic obstructive pulmonary disease with (acute) exacerbation: Secondary | ICD-10-CM | POA: Diagnosis not present

## 2015-07-10 DIAGNOSIS — M549 Dorsalgia, unspecified: Secondary | ICD-10-CM | POA: Diagnosis not present

## 2015-07-10 DIAGNOSIS — E785 Hyperlipidemia, unspecified: Secondary | ICD-10-CM | POA: Diagnosis not present

## 2015-07-10 DIAGNOSIS — J309 Allergic rhinitis, unspecified: Secondary | ICD-10-CM | POA: Diagnosis not present

## 2015-07-11 ENCOUNTER — Other Ambulatory Visit: Payer: Self-pay | Admitting: Nurse Practitioner

## 2015-07-11 ENCOUNTER — Ambulatory Visit (HOSPITAL_BASED_OUTPATIENT_CLINIC_OR_DEPARTMENT_OTHER): Payer: Medicare Other

## 2015-07-11 ENCOUNTER — Ambulatory Visit (HOSPITAL_BASED_OUTPATIENT_CLINIC_OR_DEPARTMENT_OTHER): Payer: Medicare Other | Admitting: Nurse Practitioner

## 2015-07-11 ENCOUNTER — Other Ambulatory Visit: Payer: Self-pay | Admitting: Oncology

## 2015-07-11 ENCOUNTER — Other Ambulatory Visit (HOSPITAL_BASED_OUTPATIENT_CLINIC_OR_DEPARTMENT_OTHER): Payer: Medicare Other

## 2015-07-11 VITALS — BP 150/68 | HR 92 | Temp 98.2°F | Resp 18 | Ht 65.0 in | Wt 135.6 lb

## 2015-07-11 DIAGNOSIS — C3490 Malignant neoplasm of unspecified part of unspecified bronchus or lung: Secondary | ICD-10-CM

## 2015-07-11 DIAGNOSIS — C3412 Malignant neoplasm of upper lobe, left bronchus or lung: Secondary | ICD-10-CM

## 2015-07-11 DIAGNOSIS — C3411 Malignant neoplasm of upper lobe, right bronchus or lung: Secondary | ICD-10-CM

## 2015-07-11 DIAGNOSIS — Z79899 Other long term (current) drug therapy: Secondary | ICD-10-CM

## 2015-07-11 DIAGNOSIS — Z5112 Encounter for antineoplastic immunotherapy: Secondary | ICD-10-CM | POA: Diagnosis present

## 2015-07-11 DIAGNOSIS — R5382 Chronic fatigue, unspecified: Secondary | ICD-10-CM

## 2015-07-11 LAB — CBC WITH DIFFERENTIAL/PLATELET
BASO%: 0.5 % (ref 0.0–2.0)
BASOS ABS: 0.1 10*3/uL (ref 0.0–0.1)
EOS%: 0.1 % (ref 0.0–7.0)
Eosinophils Absolute: 0 10*3/uL (ref 0.0–0.5)
HCT: 38.2 % (ref 34.8–46.6)
HEMOGLOBIN: 12.4 g/dL (ref 11.6–15.9)
LYMPH%: 6.2 % — AB (ref 14.0–49.7)
MCH: 27.2 pg (ref 25.1–34.0)
MCHC: 32.5 g/dL (ref 31.5–36.0)
MCV: 83.6 fL (ref 79.5–101.0)
MONO#: 0.4 10*3/uL (ref 0.1–0.9)
MONO%: 3.4 % (ref 0.0–14.0)
NEUT#: 9.6 10*3/uL — ABNORMAL HIGH (ref 1.5–6.5)
NEUT%: 89.8 % — ABNORMAL HIGH (ref 38.4–76.8)
Platelets: 184 10*3/uL (ref 145–400)
RBC: 4.57 10*6/uL (ref 3.70–5.45)
RDW: 16.7 % — AB (ref 11.2–14.5)
WBC: 10.6 10*3/uL — ABNORMAL HIGH (ref 3.9–10.3)
lymph#: 0.7 10*3/uL — ABNORMAL LOW (ref 0.9–3.3)

## 2015-07-11 LAB — TSH CHCC: TSH: 0.43 m[IU]/L (ref 0.308–3.960)

## 2015-07-11 LAB — COMPREHENSIVE METABOLIC PANEL (CC13)
ALT: 17 U/L (ref 0–55)
AST: 11 U/L (ref 5–34)
Albumin: 3.3 g/dL — ABNORMAL LOW (ref 3.5–5.0)
Alkaline Phosphatase: 80 U/L (ref 40–150)
Anion Gap: 5 mEq/L (ref 3–11)
BUN: 13.9 mg/dL (ref 7.0–26.0)
CHLORIDE: 103 meq/L (ref 98–109)
CO2: 31 meq/L — AB (ref 22–29)
Calcium: 8.8 mg/dL (ref 8.4–10.4)
Creatinine: 0.9 mg/dL (ref 0.6–1.1)
EGFR: 75 mL/min/{1.73_m2} — AB (ref >90)
GLUCOSE: 125 mg/dL (ref 70–140)
POTASSIUM: 4.2 meq/L (ref 3.5–5.1)
SODIUM: 139 meq/L (ref 136–145)
TOTAL PROTEIN: 5.9 g/dL — AB (ref 6.4–8.3)
Total Bilirubin: 0.44 mg/dL (ref 0.20–1.20)

## 2015-07-11 MED ORDER — SODIUM CHLORIDE 0.9 % IV SOLN
Freq: Once | INTRAVENOUS | Status: AC
  Administered 2015-07-11: 15:00:00 via INTRAVENOUS

## 2015-07-11 MED ORDER — HEPARIN SOD (PORK) LOCK FLUSH 100 UNIT/ML IV SOLN
500.0000 [IU] | Freq: Once | INTRAVENOUS | Status: AC | PRN
  Administered 2015-07-11: 500 [IU]
  Filled 2015-07-11: qty 5

## 2015-07-11 MED ORDER — HYDROCODONE-ACETAMINOPHEN 5-325 MG PO TABS: 1.0000 | ORAL_TABLET | ORAL | Status: DC | PRN

## 2015-07-11 MED ORDER — SODIUM CHLORIDE 0.9 % IV SOLN
2.8000 mg/kg | Freq: Once | INTRAVENOUS | Status: AC
  Administered 2015-07-11: 180 mg via INTRAVENOUS
  Filled 2015-07-11: qty 14

## 2015-07-11 MED ORDER — SODIUM CHLORIDE 0.9 % IJ SOLN
10.0000 mL | INTRAMUSCULAR | Status: DC | PRN
  Administered 2015-07-11: 10 mL
  Filled 2015-07-11: qty 10

## 2015-07-11 NOTE — Progress Notes (Signed)
  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   PRIOR THERAPY:  1. Status post palliative radiotherapy to the left lung mass under the care of Dr. Pablo Ledger completed on 03/06/2012.  2. Systemic chemotherapy with carboplatin for AUC of 6 on day 1 and Abraxane 100 mg/M2 on days 1, 8 and 15 every 3 weeks. Status post 2 cycles. From cycle 3 forward AUC will be decreased to 4.5 given on day 1 and the Abraxane will be decreased to 90 mg per meter squared on days 1, 8 and 15 every 3 weeks, Status post a total of 3 cycles.  3. Systemic chemotherapy with carboplatin for AUC of 5 on day 1 and gemcitabine 1000 mg/m2 given on day 1 and day 8 every 3 weeks,status post 1 cycle. Due to significant neutropenia she will be dosed reduced beginning cycle 2 forward to carboplatin at an AUC of 4 given on day 1 and gemcitabine at 800 mg per meter squared given on days 1 and 8 every 3 weeks. Status post 6 cycles.  4. Status post palliative radiotherapy to the right hilum under the care of Dr. Pablo Ledger completed on 11/26/2013.  CURRENT THERAPY: Immunotherapy with Nivolumab 3 MG/KG every 2 weeks. First dose expected 03/07/2015. Status post 8 cycles.  INTERVAL HISTORY:   Ms. Nass returns for follow-up. She completed cycle 8 nivolumab 06/13/2015. She was hospitalized 06/25/2015 through 06/27/2015 with a COPD exacerbation. She is feeling much better. Dyspnea is markedly improved. She is now on 2 L of oxygen continuously. Cough is better. No fever. She has stable back pain. She requests a new prescription for hydrocodone. She has a good appetite. No nausea or vomiting. No mouth sores. No diarrhea. No skin rash.  Objective:  Vital signs in last 24 hours:  Blood pressure 150/68, pulse 92, temperature 98.2 F (36.8 C), temperature source Oral, resp. rate 18, height '5\' 5"'$  (1.651 m), weight 135 lb 9.6 oz (61.508  kg), SpO2 100 %.    HEENT: No thrush or ulcers. Resp: Lungs with faint bilateral rhonchi. Cardio: Regular rate and rhythm. GI: Abdomen soft and nontender. No hepatomegaly. Vascular: No leg edema. Calves soft and nontender. Skin: No rash. Port-A-Cath without erythema.    Lab Results:  Lab Results  Component Value Date   WBC 10.6* 07/11/2015   HGB 12.4 07/11/2015   HCT 38.2 07/11/2015   MCV 83.6 07/11/2015   PLT 184 07/11/2015   NEUTROABS 9.6* 07/11/2015    Imaging:  No results found.  Medications: I have reviewed the patient's current medications.  Assessment/Plan: 1. Metastatic non-small cell lung cancer, squamous cell carcinoma currently undergoing treatment with immunotherapy with Nivolumab status post 8 cycles. Restaging CT evaluation on 06/22/2015 showed improvement.   Disposition: Ms. Reitman appears stable. She has completed 8 cycles of nivolumab. Recent restaging CT scan showed improvement. Dr. Julien Nordmann recommends continuation of nivolumab every 2 weeks. Plan to proceed with cycle 9 today as scheduled. She will return for a follow-up visit in 2 weeks. She will contact the office in the interim with any problems.    Ned Card ANP/GNP-BC   07/11/2015  2:11 PM

## 2015-07-11 NOTE — Patient Instructions (Signed)
[facility] Discharge Instructions for Patients Receiving Chemotherapy  Today you received the following chemotherapy agents:  Nivolumab.  To help prevent nausea and vomiting after your treatment, we encourage you to take your nausea medication as directed.   If you develop nausea and vomiting that is not controlled by your nausea medication, call the clinic.   BELOW ARE SYMPTOMS THAT SHOULD BE REPORTED IMMEDIATELY:  *FEVER GREATER THAN 100.5 F  *CHILLS WITH OR WITHOUT FEVER  NAUSEA AND VOMITING THAT IS NOT CONTROLLED WITH YOUR NAUSEA MEDICATION  *UNUSUAL SHORTNESS OF BREATH  *UNUSUAL BRUISING OR BLEEDING  TENDERNESS IN MOUTH AND THROAT WITH OR WITHOUT PRESENCE OF ULCERS  *URINARY PROBLEMS  *BOWEL PROBLEMS  UNUSUAL RASH Items with * indicate a potential emergency and should be followed up as soon as possible.  Feel free to call the clinic you have any questions or concerns. The clinic phone number is [phone].  Please show the CHEMO ALERT CARD at check-in to the Emergency Department and triage nurse.   

## 2015-07-20 ENCOUNTER — Telehealth: Payer: Self-pay | Admitting: Internal Medicine

## 2015-07-20 DIAGNOSIS — J9601 Acute respiratory failure with hypoxia: Secondary | ICD-10-CM

## 2015-07-20 NOTE — Telephone Encounter (Signed)
That is fine. Please send an order

## 2015-07-20 NOTE — Telephone Encounter (Signed)
Spoke with pt. She was hospitalized beginning of September. She was sent home on oxygen. She uses 2 liters O2 24/7 now. She is requesting an order be sent to [facility] for a POC. Pt is scheduled to see MR on 08/31/15. Please advise thanks

## 2015-07-20 NOTE — Telephone Encounter (Signed)
Order was sent to [facility]  I called and spoke with pt and notified that this was done  Nothing further needed

## 2015-07-25 ENCOUNTER — Ambulatory Visit (HOSPITAL_BASED_OUTPATIENT_CLINIC_OR_DEPARTMENT_OTHER): Payer: Medicare Other

## 2015-07-25 ENCOUNTER — Telehealth: Payer: Self-pay | Admitting: Internal Medicine

## 2015-07-25 ENCOUNTER — Other Ambulatory Visit: Payer: Self-pay | Admitting: *Deleted

## 2015-07-25 ENCOUNTER — Ambulatory Visit (HOSPITAL_BASED_OUTPATIENT_CLINIC_OR_DEPARTMENT_OTHER): Payer: Medicare Other | Admitting: Nurse Practitioner

## 2015-07-25 VITALS — BP 136/68 | HR 91 | Temp 98.3°F | Resp 18 | Ht 65.0 in | Wt 135.9 lb

## 2015-07-25 DIAGNOSIS — Z5112 Encounter for antineoplastic immunotherapy: Secondary | ICD-10-CM

## 2015-07-25 DIAGNOSIS — Z452 Encounter for adjustment and management of vascular access device: Secondary | ICD-10-CM

## 2015-07-25 DIAGNOSIS — C3490 Malignant neoplasm of unspecified part of unspecified bronchus or lung: Secondary | ICD-10-CM

## 2015-07-25 DIAGNOSIS — C3411 Malignant neoplasm of upper lobe, right bronchus or lung: Secondary | ICD-10-CM

## 2015-07-25 LAB — CBC WITH DIFFERENTIAL/PLATELET
BASO%: 0.5 % (ref 0.0–2.0)
BASOS ABS: 0 10*3/uL (ref 0.0–0.1)
EOS ABS: 0.2 10*3/uL (ref 0.0–0.5)
EOS%: 3.1 % (ref 0.0–7.0)
HCT: 36.3 % (ref 34.8–46.6)
HEMOGLOBIN: 12.1 g/dL (ref 11.6–15.9)
LYMPH%: 21.8 % (ref 14.0–49.7)
MCH: 27.7 pg (ref 25.1–34.0)
MCHC: 33.2 g/dL (ref 31.5–36.0)
MCV: 83.6 fL (ref 79.5–101.0)
MONO#: 0.7 10*3/uL (ref 0.1–0.9)
MONO%: 13.3 % (ref 0.0–14.0)
NEUT%: 61.3 % (ref 38.4–76.8)
NEUTROS ABS: 3.2 10*3/uL (ref 1.5–6.5)
PLATELETS: 213 10*3/uL (ref 145–400)
RBC: 4.35 10*6/uL (ref 3.70–5.45)
RDW: 16.3 % — AB (ref 11.2–14.5)
WBC: 5.2 10*3/uL (ref 3.9–10.3)
lymph#: 1.1 10*3/uL (ref 0.9–3.3)

## 2015-07-25 LAB — COMPREHENSIVE METABOLIC PANEL (CC13)
ALBUMIN: 3.4 g/dL — AB (ref 3.5–5.0)
ALK PHOS: 79 U/L (ref 40–150)
ALT: 16 U/L (ref 0–55)
ANION GAP: 6 meq/L (ref 3–11)
AST: 13 U/L (ref 5–34)
BILIRUBIN TOTAL: 0.48 mg/dL (ref 0.20–1.20)
BUN: 12.1 mg/dL (ref 7.0–26.0)
CALCIUM: 9.3 mg/dL (ref 8.4–10.4)
CO2: 31 mEq/L — ABNORMAL HIGH (ref 22–29)
Chloride: 102 mEq/L (ref 98–109)
Creatinine: 0.9 mg/dL (ref 0.6–1.1)
EGFR: 76 mL/min/{1.73_m2} — AB (ref >90)
Glucose: 108 mg/dl (ref 70–140)
POTASSIUM: 4.2 meq/L (ref 3.5–5.1)
SODIUM: 140 meq/L (ref 136–145)
TOTAL PROTEIN: 6.5 g/dL (ref 6.4–8.3)

## 2015-07-25 MED ORDER — SODIUM CHLORIDE 0.9 % IJ SOLN
10.0000 mL | INTRAMUSCULAR | Status: DC | PRN
  Administered 2015-07-25: 10 mL
  Filled 2015-07-25: qty 10

## 2015-07-25 MED ORDER — HEPARIN SOD (PORK) LOCK FLUSH 100 UNIT/ML IV SOLN
500.0000 [IU] | Freq: Once | INTRAVENOUS | Status: AC | PRN
  Administered 2015-07-25: 500 [IU]
  Filled 2015-07-25: qty 5

## 2015-07-25 MED ORDER — SODIUM CHLORIDE 0.9 % IV SOLN
180.0000 mg | Freq: Once | INTRAVENOUS | Status: AC
  Administered 2015-07-25: 180 mg via INTRAVENOUS
  Filled 2015-07-25: qty 18

## 2015-07-25 MED ORDER — ALTEPLASE 2 MG IJ SOLR
2.0000 mg | Freq: Once | INTRAMUSCULAR | Status: AC
Start: 2015-07-25 — End: 2015-07-25
  Administered 2015-07-25: 2 mg
  Filled 2015-07-25: qty 2

## 2015-07-25 MED ORDER — SODIUM CHLORIDE 0.9 % IV SOLN
Freq: Once | INTRAVENOUS | Status: AC
  Administered 2015-07-25: 16:00:00 via INTRAVENOUS

## 2015-07-25 NOTE — Progress Notes (Signed)
  [place] OFFICE PROGRESS NOTE   DIAGNOSIS: Metastatic non-small cell lung cancer, squamous cell carcinoma diagnosed in March of 2013.   PRIOR THERAPY:  1. Status post palliative radiotherapy to the left lung mass under the care of Dr. Pablo Ledger completed on 03/06/2012.  2. Systemic chemotherapy with carboplatin for AUC of 6 on day 1 and Abraxane 100 mg/M2 on days 1, 8 and 15 every 3 weeks. Status post 2 cycles. From cycle 3 forward AUC will be decreased to 4.5 given on day 1 and the Abraxane will be decreased to 90 mg per meter squared on days 1, 8 and 15 every 3 weeks, Status post a total of 3 cycles.  3. Systemic chemotherapy with carboplatin for AUC of 5 on day 1 and gemcitabine 1000 mg/m2 given on day 1 and day 8 every 3 weeks,status post 1 cycle. Due to significant neutropenia she will be dosed reduced beginning cycle 2 forward to carboplatin at an AUC of 4 given on day 1 and gemcitabine at 800 mg per meter squared given on days 1 and 8 every 3 weeks. Status post 6 cycles.  4. Status post palliative radiotherapy to the right hilum under the care of Dr. Pablo Ledger completed on 11/26/2013.  CURRENT THERAPY: Immunotherapy with Nivolumab 3 MG/KG every 2 weeks. First dose  03/07/2015. Status post 9 cycles.   INTERVAL HISTORY:   Destiny Harrison returns as scheduled. She completed cycle 9 nivolumab 07/11/2015. She denies nausea/vomiting. No mouth sores. No diarrhea. No rash. She denies shortness of breath. She utilizes supplemental oxygen at 2 L/m when active. No cough. No fever. She has a good appetite. She denies pain.  Objective:  Vital signs in last 24 hours:  Blood pressure 136/68, pulse 91, temperature 98.3 F (36.8 C), temperature source Oral, resp. rate 18, height '5\' 5"'$  (1.651 m), weight 135 lb 14.4 oz (61.644 kg), SpO2 100 %.    HEENT: No thrush or ulcers. Lymphatics: No palpable cervical or supraclavicular lymph nodes. Resp: Lungs clear bilaterally. Cardio:  Regular rate and rhythm. GI: Abdomen soft and nontender. No hepatomegaly. Vascular: No leg edema. Calves soft and nontender. Neuro: Alert and oriented.  Skin: No rash. Port cath without erythema.    Lab Results:  Lab Results  Component Value Date   WBC 5.2 07/25/2015   HGB 12.1 07/25/2015   HCT 36.3 07/25/2015   MCV 83.6 07/25/2015   PLT 213 07/25/2015   NEUTROABS 3.2 07/25/2015    Imaging:  No results found.  Medications: I have reviewed the patient's current medications.  Assessment/Plan: 1. Metastatic non-small cell lung cancer, squamous cell carcinoma, currently undergoing treatment with immunotherapy with Nivolumab. Restaging CT evaluation on 06/22/2015 following 8 cycles of nivolumab showed improvement.   Disposition: Destiny Harrison appears stable. She has completed 9 cycles of nivolumab. Plan to proceed with cycle 10 today as scheduled. She will return for a follow-up visit and cycle 11 in 2 weeks. She will contact the office in the interim with any problems.    Ned Card ANP/GNP-BC   07/25/2015  1:59 PM

## 2015-07-25 NOTE — Progress Notes (Signed)
2.61m Cathflo instilled at 1520.  Dwelled until 16:05.  Positive blood return noted and infusion initiated.

## 2015-07-25 NOTE — Patient Instructions (Signed)
Nivolumab injection What is this medicine? NIVOLUMAB (nye VOL ue mab) is used to treat certain types of melanoma and lung cancer. This medicine may be used for other purposes; ask your health care provider or pharmacist if you have questions. COMMON BRAND NAME(S): Opdivo What should I tell my health care provider before I take this medicine? They need to know if you have any of these conditions: -eye disease, vision problems -history of pancreatitis -immune system problems -inflammatory bowel disease -kidney disease -liver disease -lung disease -lupus -myasthenia gravis -multiple sclerosis -organ transplant -stomach or intestine problems -thyroid disease -tingling of the fingers or toes, or other nerve disorder -an unusual or allergic reaction to nivolumab, other medicines, foods, dyes, or preservatives -pregnant or trying to get pregnant -breast-feeding How should I use this medicine? This medicine is for infusion into a vein. It is given by a health care professional in a hospital or clinic setting. A special MedGuide will be given to you before each treatment. Be sure to read this information carefully each time. Talk to your pediatrician regarding the use of this medicine in children. Special care may be needed. Overdosage: If you think you've taken too much of this medicine contact a poison control center or emergency room at once. Overdosage: If you think you have taken too much of this medicine contact a poison control center or emergency room at once. NOTE: This medicine is only for you. Do not share this medicine with others. What if I miss a dose? It is important not to miss your dose. Call your doctor or health care professional if you are unable to keep an appointment. What may interact with this medicine? Interactions have not been studied. This list may not describe all possible interactions. Give your health care provider a list of all the medicines, herbs,  non-prescription drugs, or dietary supplements you use. Also tell them if you smoke, drink alcohol, or use illegal drugs. Some items may interact with your medicine. What should I watch for while using this medicine? Tell your doctor or healthcare professional if your symptoms do not start to get better or if they get worse. Your condition will be monitored carefully while you are receiving this medicine. You may need blood work done while you are taking this medicine. What side effects may I notice from receiving this medicine? Side effects that you should report to your doctor or health care professional as soon as possible: -allergic reactions like skin rash, itching or hives, swelling of the face, lips, or tongue -black, tarry stools -bloody or watery diarrhea -changes in vision -chills -cough -depressed mood -eye pain -feeling anxious -fever -general ill feeling or flu-like symptoms -hair loss -loss of appetite -low blood counts - this medicine may decrease the number of white blood cells, red blood cells and platelets. You may be at increased risk for infections and bleeding -pain, tingling, numbness in the hands or feet -redness, blistering, peeling or loosening of the skin, including inside the mouth -red pinpoint spots on skin -signs of decreased platelets or bleeding - bruising, pinpoint red spots on the skin, black, tarry stools, blood in the urine -signs of decreased red blood cells - unusually weak or tired, feeling faint or lightheaded, falls -signs of infection - fever or chills, cough, sore throat, pain or trouble passing urine -signs and symptoms of a dangerous change in heartbeat or heart rhythm like chest pain; dizziness; fast or irregular heartbeat; palpitations; feeling faint or lightheaded, falls; breathing problems -signs   and symptoms of high blood sugar such as dizziness; dry mouth; dry skin; fruity breath; nausea; stomach pain; increased hunger or thirst; increased  urination -signs and symptoms of kidney injury like trouble passing urine or change in the amount of urine -signs and symptoms of liver injury like dark yellow or brown urine; general ill feeling or flu-like symptoms; light-colored stools; loss of appetite; nausea; right upper belly pain; unusually weak or tired; yellowing of the eyes or skin -signs and symptoms of increased potassium like muscle weakness; chest pain; or fast, irregular heartbeat -signs and symptoms of low potassium like muscle cramps or muscle pain; chest pain; dizziness; feeling faint or lightheaded, falls; palpitations; breathing problems; or fast, irregular heartbeat -swelling of the ankles, feet, hands -weight gainSide effects that usually do not require medical attention (report to your doctor or health care professional if they continue or are bothersome): -constipation -general ill feeling or flu-like symptoms -hair loss -loss of appetite -nausea, vomiting This list may not describe all possible side effects. Call your doctor for medical advice about side effects. You may report side effects to FDA at 1-800-FDA-1088. Where should I keep my medicine? This drug is given in a hospital or clinic and will not be stored at home. NOTE: This sheet is a summary. It may not cover all possible information. If you have questions about this medicine, talk to your doctor, pharmacist, or health care provider.  2015, Elsevier/Gold Standard. (2013-12-27 13:18:19)  [facility] Discharge Instructions for Patients Receiving Chemotherapy  Today you received the following chemotherapy agents Opdivo.  To help prevent nausea and vomiting after your treatment, we encourage you to take your nausea medication as directed.   If you develop nausea and vomiting that is not controlled by your nausea medication, call the clinic.   BELOW ARE SYMPTOMS THAT SHOULD BE REPORTED IMMEDIATELY:  *FEVER GREATER THAN 100.5 F  *CHILLS WITH  OR WITHOUT FEVER  NAUSEA AND VOMITING THAT IS NOT CONTROLLED WITH YOUR NAUSEA MEDICATION  *UNUSUAL SHORTNESS OF BREATH  *UNUSUAL BRUISING OR BLEEDING  TENDERNESS IN MOUTH AND THROAT WITH OR WITHOUT PRESENCE OF ULCERS  *URINARY PROBLEMS  *BOWEL PROBLEMS  UNUSUAL RASH Items with * indicate a potential emergency and should be followed up as soon as possible.  Feel free to call the clinic you have any questions or concerns. The clinic phone number is [phone].  Please show the [place] at check-in to the Emergency Department and triage nurse.

## 2015-07-25 NOTE — Telephone Encounter (Signed)
Gave patient avs report and appointments for October and November. Added additional cycles due to provider availability.

## 2015-07-27 ENCOUNTER — Telehealth: Payer: Self-pay | Admitting: Nutrition

## 2015-07-27 NOTE — Telephone Encounter (Signed)
Contacted patient after receiving message that patient wanted samples for oral nutrition supplements. Patient enjoys both ensure and boost and prefers vanilla and chocolate. Patient will pick samples up on Tuesday, October 18 when she comes for treatment.

## 2015-07-28 DIAGNOSIS — J441 Chronic obstructive pulmonary disease with (acute) exacerbation: Secondary | ICD-10-CM | POA: Diagnosis not present

## 2015-07-28 DIAGNOSIS — D649 Anemia, unspecified: Secondary | ICD-10-CM | POA: Diagnosis not present

## 2015-07-28 DIAGNOSIS — M069 Rheumatoid arthritis, unspecified: Secondary | ICD-10-CM | POA: Diagnosis not present

## 2015-07-28 DIAGNOSIS — C3492 Malignant neoplasm of unspecified part of left bronchus or lung: Secondary | ICD-10-CM | POA: Diagnosis not present

## 2015-08-08 ENCOUNTER — Other Ambulatory Visit (HOSPITAL_BASED_OUTPATIENT_CLINIC_OR_DEPARTMENT_OTHER): Payer: Medicare Other

## 2015-08-08 ENCOUNTER — Ambulatory Visit (HOSPITAL_BASED_OUTPATIENT_CLINIC_OR_DEPARTMENT_OTHER): Payer: Medicare Other | Admitting: Nurse Practitioner

## 2015-08-08 ENCOUNTER — Telehealth: Payer: Self-pay | Admitting: Nurse Practitioner

## 2015-08-08 ENCOUNTER — Ambulatory Visit (HOSPITAL_BASED_OUTPATIENT_CLINIC_OR_DEPARTMENT_OTHER): Payer: Medicare Other

## 2015-08-08 ENCOUNTER — Encounter: Payer: Self-pay | Admitting: Nurse Practitioner

## 2015-08-08 VITALS — BP 157/64 | HR 76 | Temp 98.8°F | Resp 18 | Ht 65.0 in | Wt 138.0 lb

## 2015-08-08 DIAGNOSIS — R05 Cough: Secondary | ICD-10-CM

## 2015-08-08 DIAGNOSIS — C3411 Malignant neoplasm of upper lobe, right bronchus or lung: Secondary | ICD-10-CM

## 2015-08-08 DIAGNOSIS — Z5112 Encounter for antineoplastic immunotherapy: Secondary | ICD-10-CM

## 2015-08-08 DIAGNOSIS — C3490 Malignant neoplasm of unspecified part of unspecified bronchus or lung: Secondary | ICD-10-CM

## 2015-08-08 DIAGNOSIS — R5382 Chronic fatigue, unspecified: Secondary | ICD-10-CM

## 2015-08-08 DIAGNOSIS — Z79899 Other long term (current) drug therapy: Secondary | ICD-10-CM

## 2015-08-08 LAB — COMPREHENSIVE METABOLIC PANEL (CC13)
ALT: 12 U/L (ref 0–55)
ANION GAP: 7 meq/L (ref 3–11)
AST: 14 U/L (ref 5–34)
Albumin: 3.5 g/dL (ref 3.5–5.0)
Alkaline Phosphatase: 81 U/L (ref 40–150)
BUN: 10 mg/dL (ref 7.0–26.0)
CHLORIDE: 105 meq/L (ref 98–109)
CO2: 29 meq/L (ref 22–29)
CREATININE: 0.8 mg/dL (ref 0.6–1.1)
Calcium: 9.4 mg/dL (ref 8.4–10.4)
EGFR: 79 mL/min/{1.73_m2} — AB (ref >90)
Glucose: 108 mg/dl (ref 70–140)
Potassium: 4.1 mEq/L (ref 3.5–5.1)
Sodium: 142 mEq/L (ref 136–145)
Total Bilirubin: 0.67 mg/dL (ref 0.20–1.20)
Total Protein: 6.8 g/dL (ref 6.4–8.3)

## 2015-08-08 LAB — CBC WITH DIFFERENTIAL/PLATELET
BASO%: 0.4 % (ref 0.0–2.0)
Basophils Absolute: 0 10*3/uL (ref 0.0–0.1)
EOS%: 3.3 % (ref 0.0–7.0)
Eosinophils Absolute: 0.2 10*3/uL (ref 0.0–0.5)
HCT: 33.2 % — ABNORMAL LOW (ref 34.8–46.6)
HGB: 11.2 g/dL — ABNORMAL LOW (ref 11.6–15.9)
LYMPH%: 27.2 % (ref 14.0–49.7)
MCH: 28.1 pg (ref 25.1–34.0)
MCHC: 33.7 g/dL (ref 31.5–36.0)
MCV: 83.4 fL (ref 79.5–101.0)
MONO#: 0.7 10*3/uL (ref 0.1–0.9)
MONO%: 13.7 % (ref 0.0–14.0)
NEUT#: 3 10*3/uL (ref 1.5–6.5)
NEUT%: 55.4 % (ref 38.4–76.8)
PLATELETS: 257 10*3/uL (ref 145–400)
RBC: 3.98 10*6/uL (ref 3.70–5.45)
RDW: 14.6 % — ABNORMAL HIGH (ref 11.2–14.5)
WBC: 5.4 10*3/uL (ref 3.9–10.3)
lymph#: 1.5 10*3/uL (ref 0.9–3.3)

## 2015-08-08 LAB — TSH CHCC: TSH: 0.939 m[IU]/L (ref 0.308–3.960)

## 2015-08-08 MED ORDER — SODIUM CHLORIDE 0.9 % IV SOLN
240.0000 mg | Freq: Once | INTRAVENOUS | Status: AC
  Administered 2015-08-08: 240 mg via INTRAVENOUS
  Filled 2015-08-08: qty 24

## 2015-08-08 MED ORDER — SODIUM CHLORIDE 0.9 % IJ SOLN
10.0000 mL | INTRAMUSCULAR | Status: DC | PRN
  Administered 2015-08-08: 10 mL
  Filled 2015-08-08: qty 10

## 2015-08-08 MED ORDER — HEPARIN SOD (PORK) LOCK FLUSH 100 UNIT/ML IV SOLN
500.0000 [IU] | Freq: Once | INTRAVENOUS | Status: AC | PRN
  Administered 2015-08-08: 500 [IU]
  Filled 2015-08-08: qty 5

## 2015-08-08 MED ORDER — HYDROCODONE-ACETAMINOPHEN 5-325 MG PO TABS: 1.0000 | ORAL_TABLET | ORAL | Status: DC | PRN

## 2015-08-08 MED ORDER — SODIUM CHLORIDE 0.9 % IV SOLN
Freq: Once | INTRAVENOUS | Status: AC
  Administered 2015-08-08: 12:00:00 via INTRAVENOUS

## 2015-08-08 NOTE — Patient Instructions (Signed)
[facility] Discharge Instructions for Patients Receiving Chemotherapy  Today you received the following chemotherapy agents:  Nivolumab.  To help prevent nausea and vomiting after your treatment, we encourage you to take your nausea medication as directed.   If you develop nausea and vomiting that is not controlled by your nausea medication, call the clinic.   BELOW ARE SYMPTOMS THAT SHOULD BE REPORTED IMMEDIATELY:  *FEVER GREATER THAN 100.5 F  *CHILLS WITH OR WITHOUT FEVER  NAUSEA AND VOMITING THAT IS NOT CONTROLLED WITH YOUR NAUSEA MEDICATION  *UNUSUAL SHORTNESS OF BREATH  *UNUSUAL BRUISING OR BLEEDING  TENDERNESS IN MOUTH AND THROAT WITH OR WITHOUT PRESENCE OF ULCERS  *URINARY PROBLEMS  *BOWEL PROBLEMS  UNUSUAL RASH Items with * indicate a potential emergency and should be followed up as soon as possible.  Feel free to call the clinic you have any questions or concerns. The clinic phone number is [phone].  Please show the CHEMO ALERT CARD at check-in to the Emergency Department and triage nurse.   

## 2015-08-08 NOTE — Progress Notes (Signed)
  [place] OFFICE PROGRESS NOTE   DIAGNOSIS: Metastatic non-small cell lung cancer, squamous cell carcinoma diagnosed in March of 2013.   PRIOR THERAPY:  1. Status post palliative radiotherapy to the left lung mass under the care of Dr. Pablo Ledger completed on 03/06/2012.  2. Systemic chemotherapy with carboplatin for AUC of 6 on day 1 and Abraxane 100 mg/M2 on days 1, 8 and 15 every 3 weeks. Status post 2 cycles. From cycle 3 forward AUC will be decreased to 4.5 given on day 1 and the Abraxane will be decreased to 90 mg per meter squared on days 1, 8 and 15 every 3 weeks, Status post a total of 3 cycles.  3. Systemic chemotherapy with carboplatin for AUC of 5 on day 1 and gemcitabine 1000 mg/m2 given on day 1 and day 8 every 3 weeks,status post 1 cycle. Due to significant neutropenia she will be dosed reduced beginning cycle 2 forward to carboplatin at an AUC of 4 given on day 1 and gemcitabine at 800 mg per meter squared given on days 1 and 8 every 3 weeks. Status post 6 cycles.  4. Status post palliative radiotherapy to the right hilum under the care of Dr. Pablo Ledger completed on 11/26/2013.  CURRENT THERAPY: Immunotherapy with Nivolumab 3 MG/KG every 2 weeks. First dose 03/07/2015. Status post 10 cycles.     INTERVAL HISTORY:   [name] returns as scheduled. She completed cycle 10 nivolumab 07/25/2015. She denies nausea/vomiting. No mouth sores. No diarrhea. No rash. She has stable dyspnea on exertion. She continues supplemental oxygen at 2 L when active. Cough continues to be improved. No hemoptysis. No fever. She denies pain at present. She takes Vicodin periodically for back pain. She has a good appetite.  Objective:  Vital signs in last 24 hours:  Blood pressure 157/64, pulse 76, temperature 98.8 F (37.1 C), temperature source Oral, resp. rate 18, height '5\' 5"'$  (1.651 m), weight 138 lb (62.596 kg), SpO2 99 %.    HEENT: No thrush or ulcers. Lymphatics: No  palpable cervical or supra clavicular lymph nodes. Resp: Expiratory rhonchi at the upper lung fields bilaterally. No respiratory distress. Cardio: Regular rate and rhythm. GI: Abdomen soft and nontender. No hepatomegaly. Vascular: No leg edema. Skin: No rash. Port-A-Cath without erythema.    Lab Results:  Lab Results  Component Value Date   WBC 5.4 08/08/2015   HGB 11.2* 08/08/2015   HCT 33.2* 08/08/2015   MCV 83.4 08/08/2015   PLT 257 08/08/2015   NEUTROABS 3.0 08/08/2015    Imaging:  No results found.  Medications: I have reviewed the patient's current medications.  Assessment/Plan: 1. Metastatic non-small cell lung cancer, squamous cell carcinoma, currently undergoing treatment with immunotherapy with Nivolumab. Restaging CT evaluation on 06/22/2015 following 8 cycles of nivolumab showed improvement.   Disposition: Destiny Harrison appears stable. She has completed 10 cycles of nivolumab. Plan to proceed with cycle 11 today as scheduled. She will return for follow-up visit and cycle 12 in 2 weeks. She will contact the office in the interim with any problems.  Plan reviewed with Dr. Julien Nordmann.    Ned Card ANP/GNP-BC   08/08/2015  11:31 AM

## 2015-08-08 NOTE — Telephone Encounter (Signed)
As per Owens Shark call placed to [facility]. Nutrition to inquire for Destiny Harrison about some supplements she had for her. Left Barb a message,she returned the call will see Destiny Harrison in infusion.

## 2015-08-17 ENCOUNTER — Inpatient Hospital Stay (HOSPITAL_COMMUNITY)
Admission: EM | Admit: 2015-08-17 | Discharge: 2015-08-20 | DRG: 193 | Disposition: A | Discharge status: Home / Self Care | Payer: Medicare Other | Attending: Internal Medicine | Admitting: Internal Medicine

## 2015-08-17 DIAGNOSIS — C3490 Malignant neoplasm of unspecified part of unspecified bronchus or lung: Secondary | ICD-10-CM | POA: Diagnosis not present

## 2015-08-17 DIAGNOSIS — K449 Diaphragmatic hernia without obstruction or gangrene: Secondary | ICD-10-CM | POA: Diagnosis present

## 2015-08-17 DIAGNOSIS — M069 Rheumatoid arthritis, unspecified: Secondary | ICD-10-CM | POA: Diagnosis present

## 2015-08-17 DIAGNOSIS — Z91013 Allergy to seafood: Secondary | ICD-10-CM

## 2015-08-17 DIAGNOSIS — C3412 Malignant neoplasm of upper lobe, left bronchus or lung: Secondary | ICD-10-CM | POA: Diagnosis not present

## 2015-08-17 DIAGNOSIS — E785 Hyperlipidemia, unspecified: Secondary | ICD-10-CM | POA: Diagnosis present

## 2015-08-17 DIAGNOSIS — Z515 Encounter for palliative care: Secondary | ICD-10-CM | POA: Diagnosis not present

## 2015-08-17 DIAGNOSIS — Z803 Family history of malignant neoplasm of breast: Secondary | ICD-10-CM

## 2015-08-17 DIAGNOSIS — Z923 Personal history of irradiation: Secondary | ICD-10-CM | POA: Diagnosis not present

## 2015-08-17 DIAGNOSIS — Y95 Nosocomial condition: Secondary | ICD-10-CM | POA: Diagnosis present

## 2015-08-17 DIAGNOSIS — Z87891 Personal history of nicotine dependence: Secondary | ICD-10-CM

## 2015-08-17 DIAGNOSIS — D630 Anemia in neoplastic disease: Secondary | ICD-10-CM | POA: Diagnosis present

## 2015-08-17 DIAGNOSIS — Z66 Do not resuscitate: Secondary | ICD-10-CM | POA: Diagnosis present

## 2015-08-17 DIAGNOSIS — J189 Pneumonia, unspecified organism: Secondary | ICD-10-CM | POA: Diagnosis not present

## 2015-08-17 DIAGNOSIS — G8929 Other chronic pain: Secondary | ICD-10-CM | POA: Diagnosis present

## 2015-08-17 DIAGNOSIS — R1310 Dysphagia, unspecified: Secondary | ICD-10-CM | POA: Diagnosis present

## 2015-08-17 DIAGNOSIS — J440 Chronic obstructive pulmonary disease with acute lower respiratory infection: Secondary | ICD-10-CM | POA: Diagnosis present

## 2015-08-17 DIAGNOSIS — Z9981 Dependence on supplemental oxygen: Secondary | ICD-10-CM | POA: Diagnosis not present

## 2015-08-17 DIAGNOSIS — Z9221 Personal history of antineoplastic chemotherapy: Secondary | ICD-10-CM | POA: Diagnosis not present

## 2015-08-17 DIAGNOSIS — R000 Tachycardia, unspecified: Secondary | ICD-10-CM | POA: Diagnosis present

## 2015-08-17 DIAGNOSIS — Z79899 Other long term (current) drug therapy: Secondary | ICD-10-CM | POA: Diagnosis not present

## 2015-08-17 DIAGNOSIS — J9601 Acute respiratory failure with hypoxia: Secondary | ICD-10-CM | POA: Diagnosis present

## 2015-08-17 DIAGNOSIS — Z79891 Long term (current) use of opiate analgesic: Secondary | ICD-10-CM

## 2015-08-17 DIAGNOSIS — J45909 Unspecified asthma, uncomplicated: Secondary | ICD-10-CM | POA: Diagnosis present

## 2015-08-17 DIAGNOSIS — Z833 Family history of diabetes mellitus: Secondary | ICD-10-CM | POA: Diagnosis not present

## 2015-08-17 DIAGNOSIS — J441 Chronic obstructive pulmonary disease with (acute) exacerbation: Secondary | ICD-10-CM | POA: Diagnosis not present

## 2015-08-17 DIAGNOSIS — R0602 Shortness of breath: Secondary | ICD-10-CM | POA: Diagnosis not present

## 2015-08-17 DIAGNOSIS — Z7952 Long term (current) use of systemic steroids: Secondary | ICD-10-CM

## 2015-08-17 DIAGNOSIS — C3411 Malignant neoplasm of upper lobe, right bronchus or lung: Secondary | ICD-10-CM | POA: Diagnosis present

## 2015-08-17 DIAGNOSIS — C799 Secondary malignant neoplasm of unspecified site: Secondary | ICD-10-CM | POA: Diagnosis present

## 2015-08-17 DIAGNOSIS — R918 Other nonspecific abnormal finding of lung field: Secondary | ICD-10-CM | POA: Diagnosis not present

## 2015-08-18 ENCOUNTER — Encounter (HOSPITAL_COMMUNITY): Payer: Self-pay

## 2015-08-18 ENCOUNTER — Emergency Department (HOSPITAL_COMMUNITY): Payer: Medicare Other

## 2015-08-18 DIAGNOSIS — M069 Rheumatoid arthritis, unspecified: Secondary | ICD-10-CM | POA: Diagnosis present

## 2015-08-18 DIAGNOSIS — Z79891 Long term (current) use of opiate analgesic: Secondary | ICD-10-CM | POA: Diagnosis not present

## 2015-08-18 DIAGNOSIS — C3490 Malignant neoplasm of unspecified part of unspecified bronchus or lung: Secondary | ICD-10-CM | POA: Diagnosis not present

## 2015-08-18 DIAGNOSIS — J441 Chronic obstructive pulmonary disease with (acute) exacerbation: Secondary | ICD-10-CM | POA: Diagnosis not present

## 2015-08-18 DIAGNOSIS — R1310 Dysphagia, unspecified: Secondary | ICD-10-CM | POA: Diagnosis present

## 2015-08-18 DIAGNOSIS — Z515 Encounter for palliative care: Secondary | ICD-10-CM | POA: Diagnosis not present

## 2015-08-18 DIAGNOSIS — R918 Other nonspecific abnormal finding of lung field: Secondary | ICD-10-CM | POA: Diagnosis not present

## 2015-08-18 DIAGNOSIS — E785 Hyperlipidemia, unspecified: Secondary | ICD-10-CM | POA: Diagnosis present

## 2015-08-18 DIAGNOSIS — J440 Chronic obstructive pulmonary disease with acute lower respiratory infection: Secondary | ICD-10-CM | POA: Diagnosis present

## 2015-08-18 DIAGNOSIS — Z833 Family history of diabetes mellitus: Secondary | ICD-10-CM | POA: Diagnosis not present

## 2015-08-18 DIAGNOSIS — C799 Secondary malignant neoplasm of unspecified site: Secondary | ICD-10-CM | POA: Diagnosis present

## 2015-08-18 DIAGNOSIS — Z9981 Dependence on supplemental oxygen: Secondary | ICD-10-CM | POA: Diagnosis not present

## 2015-08-18 DIAGNOSIS — D630 Anemia in neoplastic disease: Secondary | ICD-10-CM | POA: Diagnosis present

## 2015-08-18 DIAGNOSIS — J189 Pneumonia, unspecified organism: Secondary | ICD-10-CM | POA: Diagnosis present

## 2015-08-18 DIAGNOSIS — K449 Diaphragmatic hernia without obstruction or gangrene: Secondary | ICD-10-CM | POA: Diagnosis present

## 2015-08-18 DIAGNOSIS — J9601 Acute respiratory failure with hypoxia: Secondary | ICD-10-CM

## 2015-08-18 DIAGNOSIS — Z923 Personal history of irradiation: Secondary | ICD-10-CM | POA: Diagnosis not present

## 2015-08-18 DIAGNOSIS — Z91013 Allergy to seafood: Secondary | ICD-10-CM | POA: Diagnosis not present

## 2015-08-18 DIAGNOSIS — G8929 Other chronic pain: Secondary | ICD-10-CM | POA: Diagnosis present

## 2015-08-18 DIAGNOSIS — Z87891 Personal history of nicotine dependence: Secondary | ICD-10-CM | POA: Diagnosis not present

## 2015-08-18 DIAGNOSIS — C3412 Malignant neoplasm of upper lobe, left bronchus or lung: Secondary | ICD-10-CM | POA: Diagnosis not present

## 2015-08-18 DIAGNOSIS — Z9221 Personal history of antineoplastic chemotherapy: Secondary | ICD-10-CM | POA: Diagnosis not present

## 2015-08-18 DIAGNOSIS — J45909 Unspecified asthma, uncomplicated: Secondary | ICD-10-CM | POA: Diagnosis present

## 2015-08-18 DIAGNOSIS — Y95 Nosocomial condition: Secondary | ICD-10-CM | POA: Diagnosis present

## 2015-08-18 DIAGNOSIS — R0602 Shortness of breath: Secondary | ICD-10-CM | POA: Diagnosis not present

## 2015-08-18 DIAGNOSIS — Z79899 Other long term (current) drug therapy: Secondary | ICD-10-CM | POA: Diagnosis not present

## 2015-08-18 DIAGNOSIS — Z7952 Long term (current) use of systemic steroids: Secondary | ICD-10-CM | POA: Diagnosis not present

## 2015-08-18 DIAGNOSIS — R000 Tachycardia, unspecified: Secondary | ICD-10-CM | POA: Diagnosis present

## 2015-08-18 DIAGNOSIS — Z66 Do not resuscitate: Secondary | ICD-10-CM | POA: Diagnosis present

## 2015-08-18 DIAGNOSIS — Z803 Family history of malignant neoplasm of breast: Secondary | ICD-10-CM | POA: Diagnosis not present

## 2015-08-18 LAB — CBC WITH DIFFERENTIAL/PLATELET
BASOS PCT: 0 %
Basophils Absolute: 0.1 10*3/uL (ref 0.0–0.1)
Basophils Absolute: 0 10*3/uL (ref 0.0–0.1)
Basophils Relative: 1 %
EOS ABS: 0.5 10*3/uL (ref 0.0–0.7)
EOS ABS: 0 10*3/uL (ref 0.0–0.7)
Eosinophils Relative: 7 %
Eosinophils Relative: 0 %
HEMATOCRIT: 33.4 % — AB (ref 36.0–46.0)
HEMATOCRIT: 30.3 % — AB (ref 36.0–46.0)
HEMOGLOBIN: 11.3 g/dL — AB (ref 12.0–15.0)
Hemoglobin: 10.2 g/dL — ABNORMAL LOW (ref 12.0–15.0)
LYMPHS ABS: 1.6 10*3/uL (ref 0.7–4.0)
LYMPHS ABS: 0.4 10*3/uL — AB (ref 0.7–4.0)
Lymphocytes Relative: 21 %
Lymphocytes Relative: 7 %
MCH: 28.1 pg (ref 26.0–34.0)
MCH: 28.1 pg (ref 26.0–34.0)
MCHC: 33.8 g/dL (ref 30.0–36.0)
MCHC: 33.7 g/dL (ref 30.0–36.0)
MCV: 83.1 fL (ref 78.0–100.0)
MCV: 83.5 fL (ref 78.0–100.0)
MONO ABS: 1 10*3/uL (ref 0.1–1.0)
MONO ABS: 0 10*3/uL — AB (ref 0.1–1.0)
MONOS PCT: 13 %
MONOS PCT: 0 %
NEUTROS ABS: 4.5 10*3/uL (ref 1.7–7.7)
NEUTROS PCT: 58 %
Neutro Abs: 4.6 10*3/uL (ref 1.7–7.7)
Neutrophils Relative %: 93 %
Platelets: 295 10*3/uL (ref 150–400)
Platelets: 256 10*3/uL (ref 150–400)
RBC: 4.02 MIL/uL (ref 3.87–5.11)
RBC: 3.63 MIL/uL — ABNORMAL LOW (ref 3.87–5.11)
RDW: 14.3 % (ref 11.5–15.5)
RDW: 14.4 % (ref 11.5–15.5)
WBC: 7.7 10*3/uL (ref 4.0–10.5)
WBC: 5 10*3/uL (ref 4.0–10.5)

## 2015-08-18 LAB — BASIC METABOLIC PANEL
Anion gap: 7 (ref 5–15)
BUN: 11 mg/dL (ref 6–20)
CALCIUM: 8.4 mg/dL — AB (ref 8.9–10.3)
CHLORIDE: 104 mmol/L (ref 101–111)
CO2: 29 mmol/L (ref 22–32)
CREATININE: 0.83 mg/dL (ref 0.44–1.00)
GFR calc Af Amer: >60 mL/min (ref >60)
GFR calc non Af Amer: >60 mL/min (ref >60)
GLUCOSE: 173 mg/dL — AB (ref 65–99)
Potassium: 3.7 mmol/L (ref 3.5–5.1)
Sodium: 140 mmol/L (ref 135–145)

## 2015-08-18 LAB — URINALYSIS, ROUTINE W REFLEX MICROSCOPIC
Bilirubin Urine: NEGATIVE
Glucose, UA: NEGATIVE mg/dL
Hgb urine dipstick: NEGATIVE
Ketones, ur: NEGATIVE mg/dL
Leukocytes, UA: NEGATIVE
NITRITE: NEGATIVE
Protein, ur: NEGATIVE mg/dL
SPECIFIC GRAVITY, URINE: 1.006 (ref 1.005–1.030)
UROBILINOGEN UA: 0.2 mg/dL (ref 0.0–1.0)
pH: 6 (ref 5.0–8.0)

## 2015-08-18 LAB — COMPREHENSIVE METABOLIC PANEL
ALBUMIN: 3.9 g/dL (ref 3.5–5.0)
ALT: 14 U/L (ref 14–54)
AST: 22 U/L (ref 15–41)
Alkaline Phosphatase: 72 U/L (ref 38–126)
Anion gap: 8 (ref 5–15)
BUN: 13 mg/dL (ref 6–20)
CHLORIDE: 100 mmol/L — AB (ref 101–111)
CO2: 30 mmol/L (ref 22–32)
CREATININE: 0.9 mg/dL (ref 0.44–1.00)
Calcium: 9.2 mg/dL (ref 8.9–10.3)
GFR calc non Af Amer: >60 mL/min (ref >60)
GLUCOSE: 102 mg/dL — AB (ref 65–99)
Potassium: 3.6 mmol/L (ref 3.5–5.1)
SODIUM: 138 mmol/L (ref 135–145)
Total Bilirubin: 0.9 mg/dL (ref 0.3–1.2)
Total Protein: 7.6 g/dL (ref 6.5–8.1)

## 2015-08-18 LAB — PROTIME-INR
INR: 1.04 (ref 0.00–1.49)
Prothrombin Time: 13.8 seconds (ref 11.6–15.2)

## 2015-08-18 LAB — I-STAT CG4 LACTIC ACID, ED
LACTIC ACID, VENOUS: 1.59 mmol/L (ref 0.5–2.0)
Lactic Acid, Venous: 1.19 mmol/L (ref 0.5–2.0)

## 2015-08-18 MED ORDER — ONDANSETRON HCL 4 MG/2ML IJ SOLN: 4.0000 mg | Freq: Four times a day (QID) | INTRAMUSCULAR | Status: DC | PRN

## 2015-08-18 MED ORDER — HYDROCOD POLST-CPM POLST ER 10-8 MG/5ML PO SUER
5.0000 mL | Freq: Three times a day (TID) | ORAL | Status: DC | PRN
  Administered 2015-08-18 – 2015-08-20 (×5): 5 mL via ORAL
  Filled 2015-08-18 (×5): qty 5

## 2015-08-18 MED ORDER — BUDESONIDE 0.25 MG/2ML IN SUSP
0.2500 mg | Freq: Two times a day (BID) | RESPIRATORY_TRACT | Status: DC
  Administered 2015-08-18 – 2015-08-20 (×5): 0.25 mg via RESPIRATORY_TRACT
  Filled 2015-08-18 (×5): qty 2

## 2015-08-18 MED ORDER — ACETAMINOPHEN 650 MG RE SUPP: 650.0000 mg | Freq: Four times a day (QID) | RECTAL | Status: DC | PRN

## 2015-08-18 MED ORDER — PIPERACILLIN-TAZOBACTAM 3.375 G IVPB 30 MIN
3.3750 g | Freq: Once | INTRAVENOUS | Status: AC
  Administered 2015-08-18: 3.375 g via INTRAVENOUS
  Filled 2015-08-18: qty 50

## 2015-08-18 MED ORDER — LEVALBUTEROL HCL 0.63 MG/3ML IN NEBU
INHALATION_SOLUTION | RESPIRATORY_TRACT | Status: AC
  Filled 2015-08-18: qty 3

## 2015-08-18 MED ORDER — FOLIC ACID 1 MG PO TABS
1000.0000 ug | ORAL_TABLET | Freq: Every day | ORAL | Status: DC
  Administered 2015-08-18 – 2015-08-20 (×3): 1 mg via ORAL
  Filled 2015-08-18 (×3): qty 1

## 2015-08-18 MED ORDER — HYDROCODONE-ACETAMINOPHEN 5-325 MG PO TABS
1.0000 | ORAL_TABLET | ORAL | Status: DC | PRN
  Administered 2015-08-18 – 2015-08-19 (×2): 1 via ORAL
  Filled 2015-08-18 (×3): qty 1

## 2015-08-18 MED ORDER — METHYLPREDNISOLONE SODIUM SUCC 40 MG IJ SOLR
40.0000 mg | Freq: Three times a day (TID) | INTRAMUSCULAR | Status: DC
  Administered 2015-08-18 – 2015-08-19 (×4): 40 mg via INTRAVENOUS
  Filled 2015-08-18 (×4): qty 1

## 2015-08-18 MED ORDER — LEVALBUTEROL HCL 0.63 MG/3ML IN NEBU
0.6300 mg | INHALATION_SOLUTION | Freq: Four times a day (QID) | RESPIRATORY_TRACT | Status: DC
  Administered 2015-08-18 – 2015-08-19 (×7): 0.63 mg via RESPIRATORY_TRACT
  Filled 2015-08-18 (×7): qty 3

## 2015-08-18 MED ORDER — SODIUM CHLORIDE 0.9 % IV BOLUS (SEPSIS): 1000.0000 mL | Freq: Once | INTRAVENOUS | Status: DC

## 2015-08-18 MED ORDER — PIPERACILLIN-TAZOBACTAM 3.375 G IVPB
3.3750 g | Freq: Three times a day (TID) | INTRAVENOUS | Status: DC
  Administered 2015-08-18 – 2015-08-20 (×6): 3.375 g via INTRAVENOUS
  Filled 2015-08-18 (×5): qty 50

## 2015-08-18 MED ORDER — GUAIFENESIN ER 600 MG PO TB12
600.0000 mg | ORAL_TABLET | Freq: Two times a day (BID) | ORAL | Status: DC
  Administered 2015-08-18 – 2015-08-20 (×5): 600 mg via ORAL
  Filled 2015-08-18 (×5): qty 1

## 2015-08-18 MED ORDER — ALBUTEROL (5 MG/ML) CONTINUOUS INHALATION SOLN
7.5000 mg/h | INHALATION_SOLUTION | Freq: Once | RESPIRATORY_TRACT | Status: AC
  Administered 2015-08-18: 7.5 mg/h via RESPIRATORY_TRACT
  Filled 2015-08-18: qty 20

## 2015-08-18 MED ORDER — IPRATROPIUM BROMIDE 0.02 % IN SOLN
0.5000 mg | Freq: Four times a day (QID) | RESPIRATORY_TRACT | Status: DC
Start: 2015-08-18 — End: 2015-08-20
  Administered 2015-08-18 – 2015-08-19 (×7): 0.5 mg via RESPIRATORY_TRACT
  Filled 2015-08-18 (×7): qty 2.5

## 2015-08-18 MED ORDER — ONDANSETRON 4 MG PO TBDP
8.0000 mg | ORAL_TABLET | Freq: Three times a day (TID) | ORAL | Status: DC | PRN
Start: 2015-08-18 — End: 2015-08-20

## 2015-08-18 MED ORDER — SODIUM CHLORIDE 0.9 % IJ SOLN: 3.0000 mL | Freq: Two times a day (BID) | INTRAMUSCULAR | Status: DC

## 2015-08-18 MED ORDER — METHYLPREDNISOLONE SODIUM SUCC 125 MG IJ SOLR
125.0000 mg | Freq: Once | INTRAMUSCULAR | Status: AC
  Administered 2015-08-18: 125 mg via INTRAVENOUS
  Filled 2015-08-18: qty 2

## 2015-08-18 MED ORDER — PIPERACILLIN-TAZOBACTAM 3.375 G IVPB 30 MIN: 3.3750 g | Freq: Three times a day (TID) | INTRAVENOUS | Status: DC

## 2015-08-18 MED ORDER — ENOXAPARIN SODIUM 40 MG/0.4ML ~~LOC~~ SOLN
40.0000 mg | SUBCUTANEOUS | Status: DC
Start: 2015-08-18 — End: 2015-08-20
  Administered 2015-08-18 – 2015-08-19 (×2): 40 mg via SUBCUTANEOUS
  Filled 2015-08-18 (×2): qty 0.4

## 2015-08-18 MED ORDER — PROCHLORPERAZINE MALEATE 10 MG PO TABS: 10.0000 mg | ORAL_TABLET | Freq: Four times a day (QID) | ORAL | Status: DC | PRN

## 2015-08-18 MED ORDER — SODIUM CHLORIDE 0.9 % IJ SOLN: 10.0000 mL | Freq: Two times a day (BID) | INTRAMUSCULAR | Status: DC

## 2015-08-18 MED ORDER — VANCOMYCIN HCL 500 MG IV SOLR
500.0000 mg | Freq: Two times a day (BID) | INTRAVENOUS | Status: DC
  Administered 2015-08-18 – 2015-08-20 (×4): 500 mg via INTRAVENOUS
  Filled 2015-08-18 (×5): qty 500

## 2015-08-18 MED ORDER — ALBUTEROL SULFATE (2.5 MG/3ML) 0.083% IN NEBU
5.0000 mg | INHALATION_SOLUTION | Freq: Once | RESPIRATORY_TRACT | Status: AC
  Administered 2015-08-18: 5 mg via RESPIRATORY_TRACT
  Filled 2015-08-18: qty 6

## 2015-08-18 MED ORDER — VANCOMYCIN HCL IN DEXTROSE 1-5 GM/200ML-% IV SOLN
1000.0000 mg | Freq: Once | INTRAVENOUS | Status: AC
  Administered 2015-08-18: 1000 mg via INTRAVENOUS
  Filled 2015-08-18: qty 200

## 2015-08-18 MED ORDER — SIMVASTATIN 10 MG PO TABS
10.0000 mg | ORAL_TABLET | Freq: Every day | ORAL | Status: DC
  Administered 2015-08-18 – 2015-08-19 (×2): 10 mg via ORAL
  Filled 2015-08-18 (×2): qty 1

## 2015-08-18 MED ORDER — SODIUM CHLORIDE 0.9 % IV BOLUS (SEPSIS)
1000.0000 mL | Freq: Once | INTRAVENOUS | Status: AC
  Administered 2015-08-18: 1000 mL via INTRAVENOUS

## 2015-08-18 MED ORDER — SODIUM CHLORIDE 0.9 % IJ SOLN
10.0000 mL | INTRAMUSCULAR | Status: DC | PRN
  Administered 2015-08-18 – 2015-08-19 (×3): 10 mL
  Filled 2015-08-18 (×2): qty 40

## 2015-08-18 MED ORDER — LEVALBUTEROL HCL 0.63 MG/3ML IN NEBU
0.6300 mg | INHALATION_SOLUTION | Freq: Four times a day (QID) | RESPIRATORY_TRACT | Status: DC | PRN
  Administered 2015-08-20: 0.63 mg via RESPIRATORY_TRACT
  Filled 2015-08-18: qty 3

## 2015-08-18 MED ORDER — ONDANSETRON HCL 4 MG PO TABS: 4.0000 mg | ORAL_TABLET | Freq: Four times a day (QID) | ORAL | Status: DC | PRN

## 2015-08-18 MED ORDER — ACETAMINOPHEN 325 MG PO TABS: 650.0000 mg | ORAL_TABLET | Freq: Four times a day (QID) | ORAL | Status: DC | PRN

## 2015-08-18 NOTE — Evaluation (Signed)
Clinical/Bedside Swallow Evaluation Patient Details  Name: Erminia Mcnew MRN: [id number] Date of Birth: 10-Apr-1941  Today's Date: 08/18/2015 Time: SLP Start Time (ACUTE ONLY): 1100 SLP Stop Time (ACUTE ONLY): 1120 SLP Time Calculation (min) (ACUTE ONLY): 20 min  Past Medical History:  Past Medical History  Diagnosis Date  . COPD (chronic obstructive pulmonary disease) ([place])   . Neutropenia, drug-induced ([place]) 05/05/2012  . Hyperlipidemia   . Rheumatoid arthritis(714.0)   . Asthma   . Hiatal hernia   . History of chemotherapy   . History of radiation therapy 03/06/2012    left hilum  . History of radiation therapy 05/10/2013-05/31/2013    Left lung/ 33/75'@2'$ .25 per fraction x 15 fractions  . Radiation 11/15/13-11/26/13    Right hilum 30 Gy in 10 fractions  . Non-small cell lung cancer ([place]) dx'd 08/28/11    left lung  . Encounter for antineoplastic immunotherapy 05/22/2015  . DNR (do not resuscitate) 06/26/2015   Past Surgical History:  Past Surgical History  Procedure Laterality Date  . Appendex  1962  . Video bronchoscopy  01/28/2012    Procedure: VIDEO BRONCHOSCOPY WITHOUT FLUORO;  Surgeon: Brand Males, MD;  Location: [facility] ENDOSCOPY;  Service: Endoscopy;  Laterality: Bilateral;  . Surgery on right wrist     HPI:  Drina Jobst is a 74 y.o. female with history of metastatic non-small cell lung cancer and immunotherapy, COPD presents to the ER because of worsening shortness of breath over the last few days. Patient also has been noticing increasing productive cough. Denies any chest pain. Denies any fever or chills. In the ER chest x-ray shows features concerning for pneumonia and also on exam patient had bilateral expiratory wheeze. Patient has been admitted for COPD exacerbation and healthcare associated pneumonia. Patient states she has had some problem with swallowing over the last couple of days.    Assessment / Plan / Recommendation Clinical Impression  Pt demonstrates normal swallow  fucntion with no signs of aspiration with 3 oz Water Test. Pt report of temporary trouble swallowing coincided with respiratory distress and was described as globus. It has resolved and pt is eating and swallowing functionally. No SLP f/u needed, pt may continue diet.     Aspiration Risk  Mild    Diet Recommendation Age appropriate regular solids;Thin   Medication Administration: Whole meds with liquid    Other  Recommendations Oral Care Recommendations: Patient independent with oral care   Follow Up Recommendations       Frequency and Duration        Pertinent Vitals/Pain NA    SLP Swallow Goals     Swallow Study Prior Functional Status       General Other Pertinent Information: Tennelle Taflinger is a 74 y.o. female with history of metastatic non-small cell lung cancer and immunotherapy, COPD presents to the ER because of worsening shortness of breath over the last few days. Patient also has been noticing increasing productive cough. Denies any chest pain. Denies any fever or chills. In the ER chest x-ray shows features concerning for pneumonia and also on exam patient had bilateral expiratory wheeze. Patient has been admitted for COPD exacerbation and healthcare associated pneumonia. Patient states she has had some problem with swallowing over the last couple of days.  Type of Study: Bedside swallow evaluation Diet Prior to this Study: Regular;NPO Temperature Spikes Noted: No Respiratory Status: Supplemental O2 delivered via (comment) History of Recent Intubation: No Behavior/Cognition: Alert;Cooperative;Pleasant mood Oral Cavity - Dentition:  (dentures) Self-Feeding Abilities: Able  to feed self Patient Positioning: Upright in bed Baseline Vocal Quality: Normal Volitional Cough: Strong;Congested Volitional Swallow: Able to elicit    Oral/Motor/Sensory Function Overall Oral Motor/Sensory Function: Appears within functional limits for tasks assessed   Ice Chips     Thin Liquid  Thin Liquid: Within functional limits    Nectar Thick     Honey Thick     Puree Puree: Within functional limits   Solid   GO    Solid: Within functional limits      Herbie Baltimore, MA CCC-SLP [phone]  Acsa Estey, Katherene Ponto 08/18/2015,11:27 AM

## 2015-08-18 NOTE — Progress Notes (Signed)
[facility] PROGRESS NOTE  Destiny Harrison W[facility]:[id number] DOB: 1941/04/11 DOA: 08/17/2015 PCP: Thressa Sheller, MD  Assessment/Plan:  Acute respiratory failure secondary to pneumonia and COPD exacerbation;  Continue with nebulizer, solumedrol and IV antibiotics.   Metastatic lung cancer - on immunotherapy per oncology. Follow with Dr Julien Nordmann.  Chronic anemia probably related to cancer - follow CBC.  Hyperlipidemia on statins.  Sinus tachycardia;  TSH was normal o 10-18.   Code Status: DNR Family Communication: care discussed with patient.  Disposition Plan: remain inpatient for treatment of PNA   Consultants:  Dr Julien Nordmann added to rounding team   Procedures:  none  Antibiotics:  Vancomycin 10-28  Zosyn 10-28  HPI/Subjective: She is feeling better, she has been having productive cough, clear phlegm.  She denies cheat pain.   Objective: Filed Vitals:   08/18/15 0632  BP: 154/88  Pulse: 117  Temp: 98 F (36.7 C)  Resp: 22    Intake/Output Summary (Last 24 hours) at 08/18/15 0834 Last data filed at 08/18/15 0746  Gross per 24 hour  Intake     50 ml  Output    300 ml  Net   -250 ml   Filed Weights   08/18/15 3536  Weight: 62.052 kg (136 lb 12.8 oz)    Exam:   General:  Alert in no distress  Cardiovascular: S 1, S 2 RRR  Respiratory: bilateral ronchus, wheezing  Abdomen: Bs present, soft, nt  Musculoskeletal: no edema.     Data Reviewed: Basic Metabolic Panel:  Recent Labs Lab 08/18/15 0100  NA 138  K 3.6  CL 100*  CO2 30  GLUCOSE 102*  BUN 13  CREATININE 0.90  CALCIUM 9.2   Liver Function Tests:  Recent Labs Lab 08/18/15 0100  AST 22  ALT 14  ALKPHOS 72  BILITOT 0.9  PROT 7.6  ALBUMIN 3.9   No results for input(s): LIPASE, AMYLASE in the last 168 hours. No results for input(s): AMMONIA in the last 168 hours. CBC:  Recent Labs Lab 08/18/15 0100  WBC 7.7  NEUTROABS 4.5  HGB 11.3*  HCT 33.4*  MCV 83.1  PLT  295   Cardiac Enzymes: No results for input(s): CKTOTAL, CKMB, CKMBINDEX, TROPONINI in the last 168 hours. BNP (last 3 results)  Recent Labs  04/21/15 2316  BNP 116.7*    ProBNP (last 3 results) No results for input(s): PROBNP in the last 8760 hours.  CBG: No results for input(s): GLUCAP in the last 168 hours.  No results found for this or any previous visit (from the past 240 hour(s)).   Studies: Dg Chest 2 View  08/18/2015  CLINICAL DATA:  Known stage IV lung cancer. Acute onset of shortness of breath. Initial encounter. EXAM: CHEST  2 VIEW COMPARISON:  CT of the chest performed 06/22/2015, and chest radiograph performed 06/25/2015 FINDINGS: The patient's known right perihilar mass is perhaps slightly more prominent. The right paratracheal mass is also slightly more prominent. 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 Additional patchy opacity at the right lung base most likely reflects mild pneumonia, though local extension of tumor cannot be entirely excluded. This is new from the prior study. No pleural effusion or pneumothorax is seen. The cardiomediastinal silhouette remains normal in size. A right-sided chest port is noted ending about the mid SVC. No acute osseous abnormalities are identified. IMPRESSION: 1. New patchy opacity at the right lung base most likely reflects mild pneumonia, given the patient's symptoms. 2. Known right perihilar mass  and right paratracheal mass are slightly more prominent than on the prior study. Left-sided paramediastinal fibrosis again noted. Electronically Signed   By: Garald Balding M.D.   On: 08/18/2015 01:41    Scheduled Meds: . budesonide (PULMICORT) nebulizer solution  0.25 mg Nebulization BID  . enoxaparin (LOVENOX) injection  40 mg Subcutaneous Q24H  . folic acid  4,034 mcg Oral Daily  . guaiFENesin  600 mg Oral BID  . ipratropium  0.5 mg Nebulization Q6H  . levalbuterol  0.63 mg Nebulization Q6H  .  methylPREDNISolone (SOLU-MEDROL) injection  40 mg Intravenous 3 times per day  . piperacillin-tazobactam (ZOSYN)  IV  3.375 g Intravenous 3 times per day  . simvastatin  10 mg Oral QHS  . sodium chloride  3 mL Intravenous Q12H  . vancomycin  500 mg Intravenous Q12H   Continuous Infusions:   Principal Problem:   Acute respiratory failure with hypoxia (HCC) Active Problems:   Lung cancer (HCC)   COPD exacerbation (HCC)   Sinus tachycardia (HCC)   HCAP (healthcare-associated pneumonia)    Time spent: 35 Minutes.     Niel Hummer A  [facility] Pager [phone]. If 7PM-7AM, please contact night-coverage at [email or web address], password [facility] 08/18/2015, 8:34 AM  LOS: 0 days

## 2015-08-18 NOTE — ED Notes (Signed)
Pt feels short of breath since earlier today, partly because of the weather change and they had the gas lines changed out today and she thinks that may have irritated her also, she wears 2 liters at home all the time.

## 2015-08-18 NOTE — ED Notes (Signed)
Respiratory was called to give neb

## 2015-08-18 NOTE — Consult Note (Signed)
Consultation Note Date: 08/18/2015   Patient Name: Destiny Harrison  DOB: [birth year]  MRN: [id number]  Age / Sex: 74 y.o., female  PCP: Thressa Sheller, MD Referring Physician: Elmarie Shiley, MD  Reason for Consultation: Establishing goals of care    Clinical Assessment/Narrative: Pt is a 74 yo female with non-small cell lung ca dx in 2012 admitted with increased [place]. Per CXR new RLL opacity. Pt on antibiotics. She has a severe cough although she reports it is better. She seems to have a good understanding of her disease process. She is still undergoing chemotherapy and it is her goal to continue as long as she is able. Pt is very driven to be there for her husband who has prostate cancer and CVA, as well as her son who is on HD, on transplant list. She reports she has made all of her arrangements. That she is not afraid to die. Her faith is her major source of support in her life as well as coping with her illness. We did discuss role of opioids as well as other medical mgt tools available as her disease progresses to help with her breathing.  She feels that God will continue to support her and "she will know when it's her time". She is a DNR/DNI  Contacts/Participants in Discussion: Primary Decision Maker: Pt Relationship to Patient self HCPOA: unclear. Husband is not able  But not clear if she has stipulated one of her 3 children    SUMMARY OF RECOMMENDATIONS DNR/DNI Continue chemotherapy as long as she can tolerate or showing benefit She would continue in-pt hospitalization if it could enable her to return home to current clinical level. Her husband is of limited help but 2 children live nearby  Code Status/Advance Care Planning: DNR    Code Status Orders        Start     Ordered   08/18/15 0656  Do not attempt resuscitation (DNR)   Continuous    Question Answer Comment  In the event of cardiac or  respiratory ARREST Do not call a "code blue"   In the event of cardiac or respiratory ARREST Do not perform Intubation, CPR, defibrillation or ACLS   In the event of cardiac or respiratory ARREST Use medication by any route, position, wound care, and other measures to relive pain and suffering. May use oxygen, suction and manual treatment of airway obstruction as needed for comfort.      08/18/15 0656      Other Directives:n/a  Symptom Management:   SHOB: Educated on the role of opioids to mange dyspnea in addition to targeted pulmonary treatments. Cont PRN vicodin  Chronic pain: Cont prn vicodin  Palliative Prophylaxis:   Bowel Regimen   Psycho-social/Spiritual:  Support System: Strong Desire for further Chaplaincy support:n  Prognosis: < 6 months. I did not share this with pt but without chemo she would qualify for hospice in-home services  Discharge Planning: Home with Home Health   Chief Complaint/ Primary Diagnoses: Present on Admission:  . HCAP (healthcare-associated pneumonia) . Acute respiratory failure with hypoxia ([place]) . COPD exacerbation ([place]) . Lung cancer ([place]) . Sinus tachycardia ([place])  I have reviewed the medical record, interviewed the patient and family, and examined the patient. The following aspects are pertinent.  Past Medical History  Diagnosis Date  . COPD (chronic obstructive pulmonary disease) ([place])   . Neutropenia, drug-induced ([employer]) 05/05/2012  . Hyperlipidemia   . Rheumatoid arthritis(714.0)   . Asthma   . Hiatal hernia   .  History of chemotherapy   . History of radiation therapy 03/06/2012    left hilum  . History of radiation therapy 05/10/2013-05/31/2013    Left lung/ 33/75'@2'$ .25 per fraction x 15 fractions  . Radiation 11/15/13-11/26/13    Right hilum 30 Gy in 10 fractions  . Non-small cell lung cancer ([place]) dx'd 08/28/11    left lung  . Encounter for antineoplastic immunotherapy 05/22/2015  . DNR (do not resuscitate) 06/26/2015   Social  History   Social History  . Marital Status: Married    Spouse Name: N/A  . Number of Children: 4  . Years of Education: N/A   Occupational History  . retired      Civil engineer, contracting  .     Social History Main Topics  . Smoking status: Former Smoker -- 0.50 packs/day for 40 years    Types: Cigarettes    Quit date: 10/29/2009  . Smokeless tobacco: Never Used  . Alcohol Use: No  . Drug Use: No  . Sexual Activity: Not Asked   Other Topics Concern  . None   Social History Narrative   Spiritual person. Member of [employer] at [employer] where she lives since age 75. Never missed a sermon. Sings in choir. CHildren go to same church but husband does not. Stated 10/30/11 she is not afraid of lung cancer diagnosis or prognosis because it is in "god's hand" and "god's will".    Family History  Problem Relation Age of Onset  . Diabetes Mother     insulin dependent  . Breast cancer Mother    Scheduled Meds: . budesonide (PULMICORT) nebulizer solution  0.25 mg Nebulization BID  . enoxaparin (LOVENOX) injection  40 mg Subcutaneous Q24H  . folic acid  6,295 mcg Oral Daily  . guaiFENesin  600 mg Oral BID  . ipratropium  0.5 mg Nebulization Q6H  . levalbuterol      . levalbuterol  0.63 mg Nebulization Q6H  . methylPREDNISolone (SOLU-MEDROL) injection  40 mg Intravenous 3 times per day  . piperacillin-tazobactam (ZOSYN)  IV  3.375 g Intravenous 3 times per day  . simvastatin  10 mg Oral QHS  . sodium chloride  10-40 mL Intracatheter Q12H  . sodium chloride  3 mL Intravenous Q12H  . vancomycin  500 mg Intravenous Q12H   Continuous Infusions:  PRN Meds:.acetaminophen **OR** acetaminophen, HYDROcodone-acetaminophen, levalbuterol, [DISCONTINUED] ondansetron **OR** ondansetron (ZOFRAN) IV, ondansetron, prochlorperazine, sodium chloride Medications Prior to Admission:  Prior to Admission medications   Medication Sig Start Date End Date Taking? Authorizing Provider  albuterol  (PROVENTIL) (2.5 MG/3ML) 0.083% nebulizer solution Take 2.5 mg by nebulization every 8 (eight) hours.   Yes Historical Provider, MD  albuterol (VENTOLIN HFA) 108 (90 BASE) MCG/ACT inhaler Inhale 2 puffs into the lungs every 3 (three) hours as needed for wheezing or shortness of breath. 02/22/14  Yes Brand Males, MD  calcium-vitamin D (OSCAL WITH D) 500-200 MG-UNIT per tablet Take 1 tablet by mouth daily.   Yes Historical Provider, MD  folic acid (FOLVITE) 284 MCG tablet Take 400 mcg by mouth daily.   Yes Historical Provider, MD  guaiFENesin (MUCINEX) 600 MG 12 hr tablet Take 1 tablet (600 mg total) by mouth 2 (two) times daily. 06/27/15  Yes Reyne Dumas, MD  HYDROcodone-acetaminophen (NORCO/VICODIN) 5-325 MG tablet Take 1 tablet by mouth every 4 (four) hours as needed for moderate pain. 08/08/15  Yes Owens Shark, NP  ipratropium (ATROVENT) 0.02 % nebulizer solution Take 0.5 mg by  nebulization every 8 (eight) hours.   Yes Historical Provider, MD  ondansetron (ZOFRAN ODT) 8 MG disintegrating tablet Take 1 tablet (8 mg total) by mouth every 8 (eight) hours as needed for nausea or vomiting. 04/04/15  Yes Maryanna Shape, NP  predniSONE (DELTASONE) 10 MG tablet Take 1 tablet by mouth at bedtime. 06/28/15  Yes Historical Provider, MD  prochlorperazine (COMPAZINE) 10 MG tablet Take 1 tablet (10 mg total) by mouth every 6 (six) hours as needed for nausea or vomiting. 03/21/15  Yes Adrena E Johnson, PA-C  simvastatin (ZOCOR) 5 MG tablet Take 10 mg by mouth at bedtime.    Yes Historical Provider, MD   Allergies  Allergen Reactions  . Shellfish Allergy Swelling   CBC:    Component Value Date/Time   WBC 5.0 08/18/2015 0930   WBC 5.4 08/08/2015 1024   HGB 10.2* 08/18/2015 0930   HGB 11.2* 08/08/2015 1024   HCT 30.3* 08/18/2015 0930   HCT 33.2* 08/08/2015 1024   PLT 256 08/18/2015 0930   PLT 257 08/08/2015 1024   MCV 83.5 08/18/2015 0930   MCV 83.4 08/08/2015 1024   NEUTROABS 4.6 08/18/2015 0930    NEUTROABS 3.0 08/08/2015 1024   LYMPHSABS 0.4* 08/18/2015 0930   LYMPHSABS 1.5 08/08/2015 1024   MONOABS 0.0* 08/18/2015 0930   MONOABS 0.7 08/08/2015 1024   EOSABS 0.0 08/18/2015 0930   EOSABS 0.2 08/08/2015 1024   BASOSABS 0.0 08/18/2015 0930   BASOSABS 0.0 08/08/2015 1024   Comprehensive Metabolic Panel:    Component Value Date/Time   NA 140 08/18/2015 0930   NA 142 08/08/2015 1025   K 3.7 08/18/2015 0930   K 4.1 08/08/2015 1025   CL 104 08/18/2015 0930   CL 101 01/12/2013 0810   CO2 29 08/18/2015 0930   CO2 29 08/08/2015 1025   BUN 11 08/18/2015 0930   BUN 10.0 08/08/2015 1025   CREATININE 0.83 08/18/2015 0930   CREATININE 0.8 08/08/2015 1025   GLUCOSE 173* 08/18/2015 0930   GLUCOSE 108 08/08/2015 1025   GLUCOSE 95 01/12/2013 0810   CALCIUM 8.4* 08/18/2015 0930   CALCIUM 9.4 08/08/2015 1025   AST 22 08/18/2015 0100   AST 14 08/08/2015 1025   ALT 14 08/18/2015 0100   ALT 12 08/08/2015 1025   ALKPHOS 72 08/18/2015 0100   ALKPHOS 81 08/08/2015 1025   BILITOT 0.9 08/18/2015 0100   BILITOT 0.67 08/08/2015 1025   PROT 7.6 08/18/2015 0100   PROT 6.8 08/08/2015 1025   ALBUMIN 3.9 08/18/2015 0100   ALBUMIN 3.5 08/08/2015 1025    Review of Systems  Constitutional: Positive for activity change and fatigue.  Eyes: Negative.   Respiratory: Positive for cough and shortness of breath.   Cardiovascular: Negative.   Endocrine: Negative.   Genitourinary: Negative.   Allergic/Immunologic: Negative.   Neurological: Positive for weakness.  Hematological: Positive for adenopathy.  Psychiatric/Behavioral: Negative.     Physical Exam  Constitutional: She is oriented to person, place, and time. She appears well-developed.  HENT:  Head: Normocephalic.  Eyes:  glasses  Neck:  Increased work of breathing with conversation and cough  Cardiovascular:  tachy  Respiratory: She is in respiratory distress.  Musculoskeletal: Normal range of motion.  Neurological: She is alert  and oriented to person, place, and time.  Skin: Skin is warm and dry.  Psychiatric: She has a normal mood and affect. Her behavior is normal. Judgment and thought content normal.    Vital Signs: BP 154/88 mmHg  Pulse 117  Temp(Src) 98 F (36.7 C) (Oral)  Resp 22  Ht '5\' 5"'$  (1.651 m)  Wt 62.052 kg (136 lb 12.8 oz)  BMI 22.76 kg/m2  SpO2 94% SpO2: Last BM Date: 08/17/15  O2 Device:SpO2: 94 % O2 Flow Rate: .O2 Flow Rate (L/min): 3 L/min Intake/output summary:  Intake/Output Summary (Last 24 hours) at 08/18/15 1254 Last data filed at 08/18/15 0934  Gross per 24 hour  Intake    300 ml  Output    300 ml  Net      0 ml   LBM:  BMP Latest Ref Rng 08/18/2015 08/18/2015 08/08/2015  Glucose 65 - 99 mg/dL 173(H) 102(H) 108  BUN 6 - 20 mg/dL 11 13 10.0  Creatinine 0.44 - 1.00 mg/dL 0.83 0.90 0.8  Sodium 135 - 145 mmol/L 140 138 142  Potassium 3.5 - 5.1 mmol/L 3.7 3.6 4.1  Chloride 101 - 111 mmol/L 104 100(L) -  CO2 22 - 32 mmol/L '29 30 29  '$ Calcium 8.9 - 10.3 mg/dL 8.4(L) 9.2 9.4    Baseline Weight: Weight: 62.052 kg (136 lb 12.8 oz) Most recent weight: Weight: 62.052 kg (136 lb 12.8 oz)      Palliative Assessment/Data:  Flowsheet Rows        Most Recent Value   Intake Tab    Referral Department  Hospitalist   Unit at Time of Referral  Med/Surg Unit   Palliative Care Primary Diagnosis  Cancer   Date Notified  08/18/15   Palliative Care Type  New Palliative care   Date of Admission  08/17/15   Date first seen by Palliative Care  08/18/15   # of days Palliative referral response time  0 Day(s)   # of days IP prior to Palliative referral  1   Clinical Assessment    Palliative Performance Scale Score  50%   Pain Max last 24 hours  4   Pain Min Last 24 hours  2   Dyspnea Max Last 24 Hours  7   Dyspnea Min Last 24 hours  5   Nausea Max Last 24 Hours  0   Nausea Min Last 24 Hours  0   Anxiety Max Last 24 Hours  0   Anxiety Min Last 24 Hours  0   Other Max Last 24 Hours  Not  able to report   Psychosocial & Spiritual Assessment    Palliative Care Outcomes    Patient/Family meeting held?  Yes   Who was at the meeting?  pt   Palliative Care Outcomes  Improved non-pain symptom therapy   Palliative Care follow-up planned  No      Additional Data Reviewed: Recent Labs     08/18/15  0100  08/18/15  0930  WBC  7.7  5.0  HGB  11.3*  10.2*  PLT  295  256  NA  138  140  BUN  13  11  CREATININE  0.90  0.83    Time In: 1200 Time Out: 1310 Time Total: 70 min Greater than 50%  of this time was spent counseling and coordinating care related to the above assessment and plan.  Signed by: Dory Horn, NP  Dory Horn, NP  08/18/2015, 12:54 PM  Please contact Palliative Medicine Team phone at [phone] for questions and concerns.

## 2015-08-18 NOTE — ED Provider Notes (Addendum)
CSN: [id number]     Arrival date & time 08/17/15  2350 History  By signing my name below, I, Destiny Harrison, attest that this documentation has been prepared under the direction and in the presence of Travia Onstad Julio Alm, MD . Electronically Signed: Evelene Harrison, Scribe. 08/18/2015. 2:21 AM.  Chief Complaint  Patient presents with  . Shortness of Breath   The history is provided by the patient. No language interpreter was used.   HPI Comments:  Destiny Harrison is a 74 y.o. female with a history of metastatic non-small cell lung cancer, squamous cell carcinoma, and COPD, who presents to the Emergency Department complaining of productive cough, wheezing and SOB since 08/15/2015 which has progressively worsened since onset. She has received palliative lung therapy to left lung mass and is currently on chemo; her next treatment is 08/22/2015. She is also on 2L home O2. No alleviating factors noted. Pt notes she often recieves breathing treating and steroids with these episodes.   Dr. Earlie Server - Romie Minus Past Medical History  Diagnosis Date  . COPD (chronic obstructive pulmonary disease) ([place])   . Neutropenia, drug-induced ([place]) 05/05/2012  . Hyperlipidemia   . Rheumatoid arthritis(714.0)   . Asthma   . Hiatal hernia   . History of chemotherapy   . History of radiation therapy 03/06/2012    left hilum  . History of radiation therapy 05/10/2013-05/31/2013    Left lung/ 33/75'@2'$ .25 per fraction x 15 fractions  . Radiation 11/15/13-11/26/13    Right hilum 30 Gy in 10 fractions  . Non-small cell lung cancer ([place]) dx'd 08/28/11    left lung  . Encounter for antineoplastic immunotherapy 05/22/2015  . DNR (do not resuscitate) 06/26/2015   Past Surgical History  Procedure Laterality Date  . Appendex  1962  . Video bronchoscopy  01/28/2012    Procedure: VIDEO BRONCHOSCOPY WITHOUT FLUORO;  Surgeon: Brand Males, MD;  Location: [facility] ENDOSCOPY;  Service: Endoscopy;  Laterality: Bilateral;  . Surgery on right wrist      Family History  Problem Relation Age of Onset  . Diabetes Mother     insulin dependent  . Breast cancer Mother    Social History  Substance Use Topics  . Smoking status: Former Smoker -- 0.50 packs/day for 40 years    Types: Cigarettes    Quit date: 10/29/2009  . Smokeless tobacco: Never Used  . Alcohol Use: No   OB History    No data available     Review of Systems  Constitutional: Negative for fever and chills.  Respiratory: Positive for cough, shortness of breath and wheezing.   Cardiovascular: Negative for chest pain.  All other systems reviewed and are negative.  Allergies  Shellfish allergy  Home Medications   Prior to Admission medications   Medication Sig Start Date End Date Taking? Authorizing Provider  albuterol (PROVENTIL) (2.5 MG/3ML) 0.083% nebulizer solution Take 2.5 mg by nebulization every 8 (eight) hours.   Yes Historical Provider, MD  albuterol (VENTOLIN HFA) 108 (90 BASE) MCG/ACT inhaler Inhale 2 puffs into the lungs every 3 (three) hours as needed for wheezing or shortness of breath. 02/22/14  Yes Brand Males, MD  calcium-vitamin D (OSCAL WITH D) 500-200 MG-UNIT per tablet Take 1 tablet by mouth daily.   Yes Historical Provider, MD  folic acid (FOLVITE) 706 MCG tablet Take 400 mcg by mouth daily.   Yes Historical Provider, MD  guaiFENesin (MUCINEX) 600 MG 12 hr tablet Take 1 tablet (600 mg total) by mouth 2 (two) times  daily. 06/27/15  Yes Reyne Dumas, MD  HYDROcodone-acetaminophen (NORCO/VICODIN) 5-325 MG tablet Take 1 tablet by mouth every 4 (four) hours as needed for moderate pain. 08/08/15  Yes Owens Shark, NP  ipratropium (ATROVENT) 0.02 % nebulizer solution Take 0.5 mg by nebulization every 8 (eight) hours.   Yes Historical Provider, MD  ondansetron (ZOFRAN ODT) 8 MG disintegrating tablet Take 1 tablet (8 mg total) by mouth every 8 (eight) hours as needed for nausea or vomiting. 04/04/15  Yes Maryanna Shape, NP  predniSONE (DELTASONE) 10 MG  tablet Take 1 tablet by mouth at bedtime. 06/28/15  Yes Historical Provider, MD  prochlorperazine (COMPAZINE) 10 MG tablet Take 1 tablet (10 mg total) by mouth every 6 (six) hours as needed for nausea or vomiting. 03/21/15  Yes Adrena E Johnson, PA-C  simvastatin (ZOCOR) 5 MG tablet Take 10 mg by mouth at bedtime.    Yes Historical Provider, MD   BP 144/52 mmHg  Pulse 117  Temp(Src) 98 F (36.7 C) (Oral)  Resp 16  SpO2 100% Physical Exam  Constitutional: She is oriented to person, place, and time. She appears well-developed and well-nourished. No distress.  HENT:  Head: Normocephalic and atraumatic.  Eyes: Conjunctivae are normal.  Cardiovascular: Normal rate.   Pulmonary/Chest: Effort normal. She has wheezes (bilaterally). She has rhonchi (diffuse).  Abdominal: She exhibits no distension.  Musculoskeletal: She exhibits no edema.  Port right chest  Neurological: She is alert and oriented to person, place, and time.  Skin: Skin is warm and dry.  Psychiatric: She has a normal mood and affect.  Nursing note and vitals reviewed.   ED Course  Procedures   DIAGNOSTIC STUDIES:  Oxygen Saturation is 100% on RA, normal by my interpretation.    COORDINATION OF CARE:  1:45 AM Will order breathing treatment and steroids. Discussed treatment plan with pt at bedside and pt agreed to plan.  Labs Review Labs Reviewed  COMPREHENSIVE METABOLIC PANEL - Abnormal; Notable for the following:    Chloride 100 (*)    Glucose, Bld 102 (*)    All other components within normal limits  CBC WITH DIFFERENTIAL/PLATELET - Abnormal; Notable for the following:    Hemoglobin 11.3 (*)    HCT 33.4 (*)    All other components within normal limits  URINE CULTURE  PROTIME-INR  URINALYSIS, ROUTINE W REFLEX MICROSCOPIC (NOT AT [facility])  I-STAT CG4 LACTIC ACID, ED  I-STAT CG4 LACTIC ACID, ED    Imaging Review Dg Chest 2 View  08/18/2015  CLINICAL DATA:  Known stage IV lung cancer. Acute onset of shortness of  breath. Initial encounter. EXAM: CHEST  2 VIEW COMPARISON:  CT of the chest performed 06/22/2015, and chest radiograph performed 06/25/2015 FINDINGS: The patient's known right perihilar mass is perhaps slightly more prominent. The right paratracheal mass is also slightly more prominent. 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 Additional patchy opacity at the right lung base most likely reflects mild pneumonia, though local extension of tumor cannot be entirely excluded. This is new from the prior study. No pleural effusion or pneumothorax is seen. The cardiomediastinal silhouette remains normal in size. A right-sided chest port is noted ending about the mid SVC. No acute osseous abnormalities are identified. IMPRESSION: 1. New patchy opacity at the right lung base most likely reflects mild pneumonia, given the patient's symptoms. 2. Known right perihilar mass and right paratracheal mass are slightly more prominent than on the prior study. Left-sided paramediastinal fibrosis again noted.  Electronically Signed   By: Garald Balding M.D.   On: 08/18/2015 01:41   I have personally reviewed and evaluated these images and lab results as part of my medical decision-making.   EKG Interpretation   Date/Time:  Friday August 18 2015 00:14:56 EDT Ventricular Rate:  123 PR Interval:    QRS Duration: 80 QT Interval:  377 QTC Calculation: 539 R Axis:   94 Text Interpretation:  Atrial flutter with predominant 2:1 AV block  Probable lateral infarct, old Prolonged QT interval Baseline wander in  lead(s) V6 no acute ischemia No significant change since last tracing  Confirmed by [place] ([id number]) on 08/18/2015 12:50:16 AM      HR 127, sinus tachy   MDM   Final diagnoses:  None    Patient is a pleasant 74 year old female with metastatic non-small cell lung cancer currently undergoing chemotherapy. She is presenting today with new cough sputum production and  shortness of breath. Initially thought this was a COPD exacerbation however they the chest x-ray showed a new pneumonia. She was treated with methylpred, continuous do nebs. After finding the pneumonia on chest x-ray we gave her H CAP abx.   Patient has been intermittently tachycardic. EKG sinus. We will repeat EKG to make sure she is not in flutter.  Patient initially thought she wanted to go home. However she became very hypoxic on walking to the bathroom. Will admit.  5:18 AM HR 127 on EKG, sinus tach.  This was right after her excursion to the bathroom. Now back down to 114 sinus. Will give another 500 cc of fluid, admit to tele.    I personally performed the services described in this documentation, which was scribed in my presence. The recorded information has been reviewed and is accurate.     Laneice Meneely Julio Alm, MD 08/18/15 [place], MD 08/18/15 [id number]

## 2015-08-18 NOTE — ED Notes (Signed)
Respiratory at bedside.

## 2015-08-18 NOTE — ED Notes (Signed)
Will administer Zosyn after Vanc is completed.

## 2015-08-18 NOTE — Progress Notes (Signed)
ANTIBIOTIC CONSULT NOTE - INITIAL  Pharmacy Consult for Vancomycin and Zosyn  Indication: HCAP  Allergies  Allergen Reactions  . Shellfish Allergy Swelling    Patient Measurements: Height: '5\' 5"'$  (165.1 cm) Weight: 136 lb 12.8 oz (62.052 kg) IBW/kg (Calculated) : 57 Adjusted Body Weight:   Vital Signs: Temp: 98 F (36.7 C) (10/28 0632) Temp Source: Oral (10/28 0623) BP: 154/88 mmHg (10/28 7628) Pulse Rate: 117 (10/28 3151) Intake/Output from previous day: 10/27 0701 - 10/28 0700 In: 50 [IV Piggyback:50] Out: -  Intake/Output from this shift: Total I/O In: 50 [IV Piggyback:50] Out: -   Labs:  Recent Labs  08/18/15 0100  WBC 7.7  HGB 11.3*  PLT 295  CREATININE 0.90   Estimated Creatinine Clearance: 48.6 mL/min (by C-G formula based on Cr of 0.9). No results for input(s): VANCOTROUGH, VANCOPEAK, VANCORANDOM, GENTTROUGH, GENTPEAK, GENTRANDOM, TOBRATROUGH, TOBRAPEAK, TOBRARND, AMIKACINPEAK, AMIKACINTROU, AMIKACIN in the last 72 hours.   Microbiology: No results found for this or any previous visit (from the past 720 hour(s)).  Medical History: Past Medical History  Diagnosis Date  . COPD (chronic obstructive pulmonary disease) ([place])   . Neutropenia, drug-induced ([place]) 05/05/2012  . Hyperlipidemia   . Rheumatoid arthritis(714.0)   . Asthma   . Hiatal hernia   . History of chemotherapy   . History of radiation therapy 03/06/2012    left hilum  . History of radiation therapy 05/10/2013-05/31/2013    Left lung/ 33/75'@2'$ .25 per fraction x 15 fractions  . Radiation 11/15/13-11/26/13    Right hilum 30 Gy in 10 fractions  . Non-small cell lung cancer ([place]) dx'd 08/28/11    left lung  . Encounter for antineoplastic immunotherapy 05/22/2015  . DNR (do not resuscitate) 06/26/2015    Medications:  Anti-infectives    Start     Dose/Rate Route Frequency Ordered Stop   08/18/15 1800  vancomycin (VANCOCIN) 500 mg in sodium chloride 0.9 % 100 mL IVPB     500 mg 100 mL/hr over 60  Minutes Intravenous Every 12 hours 08/18/15 0647     08/18/15 1400  piperacillin-tazobactam (ZOSYN) IVPB 3.375 g     3.375 g 12.5 mL/hr over 240 Minutes Intravenous 3 times per day 08/18/15 0647     08/18/15 0600  piperacillin-tazobactam (ZOSYN) IVPB 3.375 g  Status:  Discontinued     3.375 g 100 mL/hr over 30 Minutes Intravenous 3 times per day 08/18/15 0148 08/18/15 0157   08/18/15 0200  vancomycin (VANCOCIN) IVPB 1000 mg/200 mL premix     1,000 mg 200 mL/hr over 60 Minutes Intravenous  Once 08/18/15 0148 08/18/15 0310   08/18/15 0200  piperacillin-tazobactam (ZOSYN) IVPB 3.375 g     3.375 g 100 mL/hr over 30 Minutes Intravenous  Once 08/18/15 0157 08/18/15 0507     Assessment: Patient with HCAP.   First dose of antibiotics already given.  Goal of Therapy:  Vancomycin trough level 15-20 mcg/ml  Zosyn based on renal function Appropriate antibiotic dosing for renal function; eradication of infection   Plan:  Measure antibiotic drug levels at steady state Follow up culture results Vancomycin '500mg'$  iv q12hr  Zosyn 3.375g IV Q8H infused over 4hrs.   Tyler Deis, Shea Stakes [place] 08/18/2015,6:50 AM

## 2015-08-18 NOTE — H&P (Signed)
[facility] History and Physical  Destiny Harrison QVZ:[id number] DOB: 1940/11/09 DOA: 08/17/2015  Referring physician: Dr.Mckuen. PCP: Thressa Sheller, MD  Specialists: Dr. Julien Nordmann.  Chief Complaint: Shortness of breath.  HPI: Destiny Harrison is a 74 y.o. female with history of metastatic non-small cell lung cancer and immunotherapy, COPD presents to the ER because of worsening shortness of breath over the last few days. Patient also has been noticing increasing productive cough. Denies any chest pain. Denies any fever or chills. In the ER chest x-ray shows features concerning for pneumonia and also on exam patient had bilateral expiratory wheeze. Patient has been admitted for COPD exacerbation and healthcare associated pneumonia. Patient states she has had some problem with swallowing over the last couple of days. Denies any nausea vomiting abdominal pain or diarrhea.   Review of Systems: As presented in the history of presenting illness, rest negative.  Past Medical History  Diagnosis Date  . COPD (chronic obstructive pulmonary disease) ([place])   . Neutropenia, drug-induced ([place]) 05/05/2012  . Hyperlipidemia   . Rheumatoid arthritis(714.0)   . Asthma   . Hiatal hernia   . History of chemotherapy   . History of radiation therapy 03/06/2012    left hilum  . History of radiation therapy 05/10/2013-05/31/2013    Left lung/ 33/75'@2'$ .25 per fraction x 15 fractions  . Radiation 11/15/13-11/26/13    Right hilum 30 Gy in 10 fractions  . Non-small cell lung cancer ([place]) dx'd 08/28/11    left lung  . Encounter for antineoplastic immunotherapy 05/22/2015  . DNR (do not resuscitate) 06/26/2015   Past Surgical History  Procedure Laterality Date  . Appendex  1962  . Video bronchoscopy  01/28/2012    Procedure: VIDEO BRONCHOSCOPY WITHOUT FLUORO;  Surgeon: Brand Males, MD;  Location: [facility] ENDOSCOPY;  Service: Endoscopy;  Laterality: Bilateral;  . Surgery on right wrist     Social History:  reports that  she quit smoking about 5 years ago. Her smoking use included Cigarettes. She has a 20 pack-year smoking history. She has never used smokeless tobacco. She reports that she does not drink alcohol or use illicit drugs. Where does patient live home. Can patient participate in ADLs? Yes.  Allergies  Allergen Reactions  . Shellfish Allergy Swelling    Family History:  Family History  Problem Relation Age of Onset  . Diabetes Mother     insulin dependent  . Breast cancer Mother       Prior to Admission medications   Medication Sig Start Date End Date Taking? Authorizing Provider  albuterol (PROVENTIL) (2.5 MG/3ML) 0.083% nebulizer solution Take 2.5 mg by nebulization every 8 (eight) hours.   Yes Historical Provider, MD  albuterol (VENTOLIN HFA) 108 (90 BASE) MCG/ACT inhaler Inhale 2 puffs into the lungs every 3 (three) hours as needed for wheezing or shortness of breath. 02/22/14  Yes Brand Males, MD  calcium-vitamin D (OSCAL WITH D) 500-200 MG-UNIT per tablet Take 1 tablet by mouth daily.   Yes Historical Provider, MD  folic acid (FOLVITE) 329 MCG tablet Take 400 mcg by mouth daily.   Yes Historical Provider, MD  guaiFENesin (MUCINEX) 600 MG 12 hr tablet Take 1 tablet (600 mg total) by mouth 2 (two) times daily. 06/27/15  Yes Reyne Dumas, MD  HYDROcodone-acetaminophen (NORCO/VICODIN) 5-325 MG tablet Take 1 tablet by mouth every 4 (four) hours as needed for moderate pain. 08/08/15  Yes Owens Shark, NP  ipratropium (ATROVENT) 0.02 % nebulizer solution Take 0.5 mg by nebulization every 8 (eight)  hours.   Yes Historical Provider, MD  ondansetron (ZOFRAN ODT) 8 MG disintegrating tablet Take 1 tablet (8 mg total) by mouth every 8 (eight) hours as needed for nausea or vomiting. 04/04/15  Yes Maryanna Shape, NP  predniSONE (DELTASONE) 10 MG tablet Take 1 tablet by mouth at bedtime. 06/28/15  Yes Historical Provider, MD  prochlorperazine (COMPAZINE) 10 MG tablet Take 1 tablet (10 mg total) by mouth  every 6 (six) hours as needed for nausea or vomiting. 03/21/15  Yes Adrena E Johnson, PA-C  simvastatin (ZOCOR) 5 MG tablet Take 10 mg by mouth at bedtime.    Yes Historical Provider, MD    Physical Exam: Filed Vitals:   08/18/15 0430 08/18/15 0500 08/18/15 0545 08/18/15 0632  BP: 144/65 119/56 129/58 154/88  Pulse:  123 113 117  Temp:    98 F (36.7 C)  TempSrc:    Oral  Resp: '24 29 21 22  '$ Height:    '5\' 5"'$  (1.651 m)  Weight:    62.052 kg (136 lb 12.8 oz)  SpO2:  95% 97% 97%     General:  Moderately built and nourished.  Eyes: Anicteric no pallor.  ENT: No discharge from the ears eyes nose and mouth.  Neck: No mass felt.  Cardiovascular: S1 and S2 heard tachycardic.  Respiratory: Bilateral expiratory wheezes heard. No crepitations.  Abdomen: Soft nontender bowel sounds present.  Skin: No rash.  Musculoskeletal: Cannot flex the right wrist. No edema.  Psychiatric: Appears normal.  Neurologic: Alert awake oriented to time place and person. Moves all extremities.  Labs on Admission:  Basic Metabolic Panel:  Recent Labs Lab 08/18/15 0100  NA 138  K 3.6  CL 100*  CO2 30  GLUCOSE 102*  BUN 13  CREATININE 0.90  CALCIUM 9.2   Liver Function Tests:  Recent Labs Lab 08/18/15 0100  AST 22  ALT 14  ALKPHOS 72  BILITOT 0.9  PROT 7.6  ALBUMIN 3.9   No results for input(s): LIPASE, AMYLASE in the last 168 hours. No results for input(s): AMMONIA in the last 168 hours. CBC:  Recent Labs Lab 08/18/15 0100  WBC 7.7  NEUTROABS 4.5  HGB 11.3*  HCT 33.4*  MCV 83.1  PLT 295   Cardiac Enzymes: No results for input(s): CKTOTAL, CKMB, CKMBINDEX, TROPONINI in the last 168 hours.  BNP (last 3 results)  Recent Labs  04/21/15 2316  BNP 116.7*    ProBNP (last 3 results) No results for input(s): PROBNP in the last 8760 hours.  CBG: No results for input(s): GLUCAP in the last 168 hours.  Radiological Exams on Admission: Dg Chest 2 View  08/18/2015   CLINICAL DATA:  Known stage IV lung cancer. Acute onset of shortness of breath. Initial encounter. EXAM: CHEST  2 VIEW COMPARISON:  CT of the chest performed 06/22/2015, and chest radiograph performed 06/25/2015 FINDINGS: The patient's known right perihilar mass is perhaps slightly more prominent. The right paratracheal mass is also slightly more prominent. 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 Additional patchy opacity at the right lung base most likely reflects mild pneumonia, though local extension of tumor cannot be entirely excluded. This is new from the prior study. No pleural effusion or pneumothorax is seen. The cardiomediastinal silhouette remains normal in size. A right-sided chest port is noted ending about the mid SVC. No acute osseous abnormalities are identified. IMPRESSION: 1. New patchy opacity at the right lung base most likely reflects mild pneumonia, given the patient's  symptoms. 2. Known right perihilar mass and right paratracheal mass are slightly more prominent than on the prior study. Left-sided paramediastinal fibrosis again noted. Electronically Signed   By: Garald Balding M.D.   On: 08/18/2015 01:41    EKG: Independently reviewed. Sinus tachycardia. (Is read as atrial flutter but is tachycardic and patient has chronic tachycardia. Presently moderate shows tachycardia.).  Assessment/Plan Principal Problem:   Acute respiratory failure with hypoxia (HCC) Active Problems:   Lung cancer (HCC)   COPD exacerbation (HCC)   Sinus tachycardia (HCC)   HCAP (healthcare-associated pneumonia)   1. Acute respiratory failure secondary to pneumonia and COPD exacerbation - patient has been placed on vancomycin and Zosyn for healthcare associated pneumonia. Patient probably has postobstructive pneumonia from a cancer also probably some aspiration, and since patient has stated she has some difficulty swallowing for which I have also consulted the therapist.  Patient also has bilateral wheeze for which patient has been placed on IV Solu-Medrol nebulizer Pulmicort. Will keep patient on Xopenex since patient has sinus tachycardia. 2. Sinus tachycardia - mostly observe in telemetry. 3. Metastatic lung cancer - on immunotherapy per oncology. 4. Chronic anemia probably related to cancer - follow CBC. 5. Hyperlipidemia on statins.  I have reviewed patient's old charts and labs. Personal reviewed patient's chest x-ray and EKG. We'll consult palliative team.   DVT Prophylaxis Lovenox.  Code Status: DO NOT RESUSCITATE.  Family Communication: Discussed with patient.  Disposition Plan: Admit to inpatient.    Jakarius Flamenco N. [facility] Pager [phone],  If 7PM-7AM, please contact night-coverage [email or web address] Password [facility] 08/18/2015, 6:56 AM

## 2015-08-18 NOTE — ED Notes (Signed)
Pt returned from [place]. She reports that she feels better after breathing treatment. Pt still appears to have labored breathing.

## 2015-08-18 NOTE — ED Notes (Signed)
Pt aware of need for urine sample.  

## 2015-08-18 NOTE — Care Management Important Message (Signed)
Important Message  Patient Details  Name: Destiny Harrison MRN: [id number] Date of Birth: [birth year]   Medicare Important Message Given:  Yes-second notification given    [place], [place] 08/18/2015, 3:25 [place] Message  Patient Details  Name: Destiny Harrison MRN: [id number] Date of Birth: Nov 01, 1940   Medicare Important Message Given:  Yes-second notification given    [place], [place] 08/18/2015, 3:24 PM

## 2015-08-19 DIAGNOSIS — J189 Pneumonia, unspecified organism: Principal | ICD-10-CM

## 2015-08-19 DIAGNOSIS — J441 Chronic obstructive pulmonary disease with (acute) exacerbation: Secondary | ICD-10-CM

## 2015-08-19 LAB — BASIC METABOLIC PANEL
ANION GAP: 5 (ref 5–15)
BUN: 16 mg/dL (ref 6–20)
CALCIUM: 8.9 mg/dL (ref 8.9–10.3)
CO2: 31 mmol/L (ref 22–32)
Chloride: 103 mmol/L (ref 101–111)
Creatinine, Ser: 0.82 mg/dL (ref 0.44–1.00)
GLUCOSE: 147 mg/dL — AB (ref 65–99)
Potassium: 4.1 mmol/L (ref 3.5–5.1)
SODIUM: 139 mmol/L (ref 135–145)

## 2015-08-19 LAB — URINE CULTURE: Culture: NO GROWTH

## 2015-08-19 LAB — CBC
HEMATOCRIT: 30.3 % — AB (ref 36.0–46.0)
Hemoglobin: 10.1 g/dL — ABNORMAL LOW (ref 12.0–15.0)
MCH: 27.7 pg (ref 26.0–34.0)
MCHC: 33.3 g/dL (ref 30.0–36.0)
MCV: 83 fL (ref 78.0–100.0)
PLATELETS: 245 10*3/uL (ref 150–400)
RBC: 3.65 MIL/uL — ABNORMAL LOW (ref 3.87–5.11)
RDW: 14.4 % (ref 11.5–15.5)
WBC: 7.1 10*3/uL (ref 4.0–10.5)

## 2015-08-19 MED ORDER — HYDROCOD POLST-CPM POLST ER 10-8 MG/5ML PO SUER: 5.0000 mL | Freq: Three times a day (TID) | ORAL | Status: DC | PRN

## 2015-08-19 MED ORDER — METHYLPREDNISOLONE SODIUM SUCC 40 MG IJ SOLR
40.0000 mg | Freq: Two times a day (BID) | INTRAMUSCULAR | Status: DC
  Administered 2015-08-19 – 2015-08-20 (×2): 40 mg via INTRAVENOUS
  Filled 2015-08-19 (×2): qty 1

## 2015-08-19 MED ORDER — METHYLPREDNISOLONE SODIUM SUCC 40 MG IJ SOLR: 40.0000 mg | Freq: Two times a day (BID) | INTRAMUSCULAR | Status: DC

## 2015-08-19 NOTE — Progress Notes (Signed)
[facility] PROGRESS NOTE  Destiny [place] WSF:[id number] DOB: Jun 09, 1941 DOA: 08/17/2015 PCP: Thressa Sheller, MD  Assessment/Plan:  Acute respiratory failure secondary to pneumonia and COPD exacerbation;  Continue with nebulizer, solumedrol and IV antibiotics.  Day 2 IV antibiotics.  Feeling better, cough has improved.   Metastatic lung cancer - on immunotherapy per oncology. Follow with Dr Julien Nordmann.  Chronic anemia probably related to cancer - follow CBC.  Hyperlipidemia on statins.  Sinus tachycardia; in setting of hypoxemia. Improving.  TSH was normal o 10-18.   Code Status: DNR Family Communication: care discussed with patient.  Disposition Plan: home in 24 hours/    Consultants:  Dr Julien Nordmann added to rounding team   Procedures:  none  Antibiotics:  Vancomycin 10-28  Zosyn 10-28  HPI/Subjective: She is feeling much better. Cough has improved. tusionex helps a lot/   Objective: Filed Vitals:   08/19/15 0740  BP: 138/72  Pulse: 99  Temp: 97.9 F (36.6 C)  Resp: 20    Intake/Output Summary (Last 24 hours) at 08/19/15 0749 Last data filed at 08/19/15 0700  Gross per 24 hour  Intake    920 ml  Output      0 ml  Net    920 ml   Filed Weights   08/18/15 0174  Weight: 62.052 kg (136 lb 12.8 oz)    Exam:   General:  Alert in no distress  Cardiovascular: S 1, S 2 RRR  Respiratory: CTA  Abdomen: Bs present, soft, nt  Musculoskeletal: no edema.     Data Reviewed: Basic Metabolic Panel:  Recent Labs Lab 08/18/15 0100 08/18/15 0930 08/19/15 0440  NA 138 140 139  K 3.6 3.7 4.1  CL 100* 104 103  CO2 '30 29 31  '$ GLUCOSE 102* 173* 147*  BUN '13 11 16  '$ CREATININE 0.90 0.83 0.82  CALCIUM 9.2 8.4* 8.9   Liver Function Tests:  Recent Labs Lab 08/18/15 0100  AST 22  ALT 14  ALKPHOS 72  BILITOT 0.9  PROT 7.6  ALBUMIN 3.9   No results for input(s): LIPASE, AMYLASE in the last 168 hours. No results for input(s): AMMONIA in the  last 168 hours. CBC:  Recent Labs Lab 08/18/15 0100 08/18/15 0930 08/19/15 0440  WBC 7.7 5.0 7.1  NEUTROABS 4.5 4.6  --   HGB 11.3* 10.2* 10.1*  HCT 33.4* 30.3* 30.3*  MCV 83.1 83.5 83.0  PLT 295 256 245   Cardiac Enzymes: No results for input(s): CKTOTAL, CKMB, CKMBINDEX, TROPONINI in the last 168 hours. BNP (last 3 results)  Recent Labs  04/21/15 2316  BNP 116.7*    ProBNP (last 3 results) No results for input(s): PROBNP in the last 8760 hours.  CBG: No results for input(s): GLUCAP in the last 168 hours.  No results found for this or any previous visit (from the past 240 hour(s)).   Studies: Dg Chest 2 View  08/18/2015  CLINICAL DATA:  Known stage IV lung cancer. Acute onset of shortness of breath. Initial encounter. EXAM: CHEST  2 VIEW COMPARISON:  CT of the chest performed 06/22/2015, and chest radiograph performed 06/25/2015 FINDINGS: The patient's known right perihilar mass is perhaps slightly more prominent. The right paratracheal mass is also slightly more prominent. 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 Additional patchy opacity at the right lung base most likely reflects mild pneumonia, though local extension of tumor cannot be entirely excluded. This is new from the prior study. No pleural effusion or pneumothorax is seen.  The cardiomediastinal silhouette remains normal in size. A right-sided chest port is noted ending about the mid SVC. No acute osseous abnormalities are identified. IMPRESSION: 1. New patchy opacity at the right lung base most likely reflects mild pneumonia, given the patient's symptoms. 2. Known right perihilar mass and right paratracheal mass are slightly more prominent than on the prior study. Left-sided paramediastinal fibrosis again noted. Electronically Signed   By: Garald Balding M.D.   On: 08/18/2015 01:41    Scheduled Meds: . budesonide (PULMICORT) nebulizer solution  0.25 mg Nebulization BID  .  enoxaparin (LOVENOX) injection  40 mg Subcutaneous Q24H  . folic acid  7,948 mcg Oral Daily  . guaiFENesin  600 mg Oral BID  . ipratropium  0.5 mg Nebulization Q6H  . levalbuterol  0.63 mg Nebulization Q6H  . methylPREDNISolone (SOLU-MEDROL) injection  40 mg Intravenous 3 times per day  . piperacillin-tazobactam (ZOSYN)  IV  3.375 g Intravenous 3 times per day  . simvastatin  10 mg Oral QHS  . sodium chloride  10-40 mL Intracatheter Q12H  . sodium chloride  3 mL Intravenous Q12H  . vancomycin  500 mg Intravenous Q12H   Continuous Infusions:   Principal Problem:   Acute respiratory failure with hypoxia (HCC) Active Problems:   Lung cancer (HCC)   COPD exacerbation (HCC)   Sinus tachycardia (HCC)   HCAP (healthcare-associated pneumonia)   Palliative care encounter    Time spent: 35 Minutes.     Niel Hummer A  [facility] Pager [phone]. If 7PM-7AM, please contact night-coverage at [email or web address], password [facility] 08/19/2015, 7:49 AM  LOS: 1 day

## 2015-08-20 MED ORDER — IPRATROPIUM BROMIDE 0.02 % IN SOLN
0.5000 mg | Freq: Four times a day (QID) | RESPIRATORY_TRACT | Status: DC
  Administered 2015-08-20: 0.5 mg via RESPIRATORY_TRACT
  Filled 2015-08-20: qty 2.5

## 2015-08-20 MED ORDER — LEVOFLOXACIN 750 MG PO TABS: 750.0000 mg | ORAL_TABLET | Freq: Every day | ORAL | Status: DC

## 2015-08-20 MED ORDER — HEPARIN SOD (PORK) LOCK FLUSH 100 UNIT/ML IV SOLN
500.0000 [IU] | INTRAVENOUS | Status: AC | PRN
  Administered 2015-08-20: 500 [IU]

## 2015-08-20 MED ORDER — BUDESONIDE 0.25 MG/2ML IN SUSP: 0.2500 mg | Freq: Two times a day (BID) | RESPIRATORY_TRACT | Status: AC

## 2015-08-20 MED ORDER — PREDNISONE 10 MG PO TABS: ORAL_TABLET | ORAL | Status: DC

## 2015-08-20 MED ORDER — LEVALBUTEROL HCL 0.63 MG/3ML IN NEBU
0.6300 mg | INHALATION_SOLUTION | Freq: Four times a day (QID) | RESPIRATORY_TRACT | Status: DC
  Administered 2015-08-20: 0.63 mg via RESPIRATORY_TRACT
  Filled 2015-08-20: qty 3

## 2015-08-20 NOTE — Discharge Summary (Signed)
Physician Discharge Summary  Destiny Harrison UUE:[id number] DOB: 12/09/1940 DOA: 08/17/2015  PCP: Thressa Sheller, MD  Admit date: 08/17/2015 Discharge date: 08/20/2015  Time spent: 35 minutes  Recommendations for Outpatient Follow-up:  Follow up with Dr Julien Nordmann for further care of lung cancer.   Discharge Diagnoses:    Acute respiratory failure with hypoxia ([place])   HCAP (healthcare-associated pneumonia)   Lung cancer ([place])   COPD exacerbation ([place])   Sinus tachycardia ([place])   Palliative care encounter   Discharge Condition: stable  Diet recommendation: heart healthy  Filed Weights   08/18/15 9150  Weight: 62.052 kg (136 lb 12.8 oz)    History of present illness:  Destiny Harrison is a 74 y.o. female with history of metastatic non-small cell lung cancer and immunotherapy, COPD presents to the ER because of worsening shortness of breath over the last few days. Patient also has been noticing increasing productive cough. Denies any chest pain. Denies any fever or chills. In the ER chest x-ray shows features concerning for pneumonia and also on exam patient had bilateral expiratory wheeze. Patient has been admitted for COPD exacerbation and healthcare associated pneumonia. Patient states she has had some problem with swallowing over the last couple of days. Denies any nausea vomiting abdominal pain or diarrhea.   Hospital Course:  Acute respiratory failure secondary to pneumonia and COPD exacerbation;  Continue with nebulizer, solumedrol and IV antibiotics.  Received 3 days of  IV antibiotics.  Feeling better, cough has improved.  She will be discharge on Levaquin for 5 days, prednisone taper and nebulizer treatments.  tusionex for cough, I have advised not to take vicodin with tusionex.   Metastatic lung cancer - on immunotherapy per oncology. Follow with Dr Julien Nordmann.  Chronic anemia probably related to cancer - follow CBC.  Hyperlipidemia on statins.  Sinus tachycardia; in setting  of hypoxemia. Improving.  TSH was normal o 10-18.   Procedures:  none  Consultations:  none  Discharge Exam: Filed Vitals:   08/20/15 0542  BP: 145/68  Pulse: 96  Temp: 98 F (36.7 C)  Resp: 20    General: NAD Cardiovascular: S 1, S 2 RRR Respiratory: CTA  Discharge Instructions   Discharge Instructions    Diet - low sodium heart healthy    Complete by:  As directed      Increase activity slowly    Complete by:  As directed           Current Discharge Medication List    START taking these medications   Details  budesonide (PULMICORT) 0.25 MG/2ML nebulizer solution Take 2 mLs (0.25 mg total) by nebulization 2 (two) times daily. Qty: 60 mL, Refills: 12    chlorpheniramine-HYDROcodone (TUSSIONEX) 10-8 MG/5ML SUER Take 5 mLs by mouth every 8 (eight) hours as needed for cough. Qty: 140 mL, Refills: 0    levofloxacin (LEVAQUIN) 750 MG tablet Take 1 tablet (750 mg total) by mouth daily. Qty: 5 tablet, Refills: 0      CONTINUE these medications which have CHANGED   Details  predniSONE (DELTASONE) 10 MG tablet Take 4 tablets for 2 days follow by 3 tablets for 2 days follow by 1 tablet daily Qty: 30 tablet, Refills: 0      CONTINUE these medications which have NOT CHANGED   Details  albuterol (PROVENTIL) (2.5 MG/3ML) 0.083% nebulizer solution Take 2.5 mg by nebulization every 8 (eight) hours.    albuterol (VENTOLIN HFA) 108 (90 BASE) MCG/ACT inhaler Inhale 2 puffs into the lungs every  3 (three) hours as needed for wheezing or shortness of breath. Qty: 1 Inhaler, Refills: 6    calcium-vitamin D (OSCAL WITH D) 500-200 MG-UNIT per tablet Take 1 tablet by mouth daily.    folic acid (FOLVITE) 462 MCG tablet Take 400 mcg by mouth daily.    guaiFENesin (MUCINEX) 600 MG 12 hr tablet Take 1 tablet (600 mg total) by mouth 2 (two) times daily. Qty: 30 tablet, Refills: 1    HYDROcodone-acetaminophen (NORCO/VICODIN) 5-325 MG tablet Take 1 tablet by mouth every 4  (four) hours as needed for moderate pain. Qty: 30 tablet, Refills: 0   Associated Diagnoses: Malignant neoplasm of lung, unspecified laterality, unspecified part of lung (HCC)    ipratropium (ATROVENT) 0.02 % nebulizer solution Take 0.5 mg by nebulization every 8 (eight) hours.    ondansetron (ZOFRAN ODT) 8 MG disintegrating tablet Take 1 tablet (8 mg total) by mouth every 8 (eight) hours as needed for nausea or vomiting. Qty: 20 tablet, Refills: 0    prochlorperazine (COMPAZINE) 10 MG tablet Take 1 tablet (10 mg total) by mouth every 6 (six) hours as needed for nausea or vomiting. Qty: 30 tablet, Refills: 0    simvastatin (ZOCOR) 5 MG tablet Take 10 mg by mouth at bedtime.        Allergies  Allergen Reactions  . Shellfish Allergy Swelling   Follow-up Information    Follow up with Thressa Sheller, MD.   Specialty:  Internal Medicine   Contact information:   [address], [place] [address] [phone]       Follow up with Eilleen Kempf., MD In 1 week.   Specialty:  Oncology   Contact information:   [address] [place] [address] [phone]        The results of significant diagnostics from this hospitalization (including imaging, microbiology, ancillary and laboratory) are listed below for reference.    Significant Diagnostic Studies: Dg Chest 2 View  08/18/2015  CLINICAL DATA:  Known stage IV lung cancer. Acute onset of shortness of breath. Initial encounter. EXAM: CHEST  2 VIEW COMPARISON:  CT of the chest performed 06/22/2015, and chest radiograph performed 06/25/2015 FINDINGS: The patient's known right perihilar mass is perhaps slightly more prominent. The right paratracheal mass is also slightly more prominent. 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 Additional patchy opacity at the right lung base most likely reflects mild pneumonia, though local extension of tumor cannot be entirely excluded. This  is new from the prior study. No pleural effusion or pneumothorax is seen. The cardiomediastinal silhouette remains normal in size. A right-sided chest port is noted ending about the mid SVC. No acute osseous abnormalities are identified. IMPRESSION: 1. New patchy opacity at the right lung base most likely reflects mild pneumonia, given the patient's symptoms. 2. Known right perihilar mass and right paratracheal mass are slightly more prominent than on the prior study. Left-sided paramediastinal fibrosis again noted. Electronically Signed   By: Garald Balding M.D.   On: 08/18/2015 01:41    Microbiology: Recent Results (from the past 240 hour(s))  Urine culture     Status: None   Collection Time: 08/18/15  7:30 AM  Result Value Ref Range Status   Specimen Description URINE, RANDOM  Final   Special Requests NONE  Final   Culture   Final    NO GROWTH 1 DAY Performed at [facility]    Report Status 08/19/2015 FINAL  Final     Labs:  Basic Metabolic Panel:  Recent Labs Lab 08/18/15 0100 08/18/15 0930 08/19/15 0440  NA 138 140 139  K 3.6 3.7 4.1  CL 100* 104 103  CO2 '30 29 31  '$ GLUCOSE 102* 173* 147*  BUN '13 11 16  '$ CREATININE 0.90 0.83 0.82  CALCIUM 9.2 8.4* 8.9   Liver Function Tests:  Recent Labs Lab 08/18/15 0100  AST 22  ALT 14  ALKPHOS 72  BILITOT 0.9  PROT 7.6  ALBUMIN 3.9   No results for input(s): LIPASE, AMYLASE in the last 168 hours. No results for input(s): AMMONIA in the last 168 hours. CBC:  Recent Labs Lab 08/18/15 0100 08/18/15 0930 08/19/15 0440  WBC 7.7 5.0 7.1  NEUTROABS 4.5 4.6  --   HGB 11.3* 10.2* 10.1*  HCT 33.4* 30.3* 30.3*  MCV 83.1 83.5 83.0  PLT 295 256 245   Cardiac Enzymes: No results for input(s): CKTOTAL, CKMB, CKMBINDEX, TROPONINI in the last 168 hours. BNP: BNP (last 3 results)  Recent Labs  04/21/15 2316  BNP 116.7*    ProBNP (last 3 results) No results for input(s): PROBNP in the last 8760 hours.  CBG: No  results for input(s): GLUCAP in the last 168 hours.     Signed:  Niel Hummer A  [facility] 08/20/2015, 7:53 AM

## 2015-08-20 NOTE — Progress Notes (Signed)
Patient asleep at this time. Appears comfortable. NAD, no SOB noted.

## 2015-08-21 ENCOUNTER — Telehealth: Payer: Self-pay

## 2015-08-21 NOTE — Telephone Encounter (Signed)
I told pt to keep her appts tomorrow.

## 2015-08-21 NOTE — Telephone Encounter (Signed)
lvm with pt that she should keep her appt tomorrow with lab/Dr Mohamed/infusion unless she hears otherwise. The message was forwarded to Dr Julien Nordmann that she is questioning the need to see Dr Julien Nordmann b/c she was discharged from the hospital on Sunday.

## 2015-08-22 ENCOUNTER — Ambulatory Visit (HOSPITAL_BASED_OUTPATIENT_CLINIC_OR_DEPARTMENT_OTHER): Payer: Medicare Other | Admitting: Internal Medicine

## 2015-08-22 ENCOUNTER — Other Ambulatory Visit (HOSPITAL_BASED_OUTPATIENT_CLINIC_OR_DEPARTMENT_OTHER): Payer: Medicare Other

## 2015-08-22 ENCOUNTER — Telehealth: Payer: Self-pay | Admitting: Internal Medicine

## 2015-08-22 ENCOUNTER — Ambulatory Visit: Payer: Medicare Other

## 2015-08-22 ENCOUNTER — Telehealth: Payer: Self-pay | Admitting: *Deleted

## 2015-08-22 ENCOUNTER — Encounter: Payer: Self-pay | Admitting: Internal Medicine

## 2015-08-22 VITALS — BP 155/67 | HR 116 | Temp 98.2°F | Resp 17 | Ht 65.0 in | Wt 136.2 lb

## 2015-08-22 DIAGNOSIS — R0609 Other forms of dyspnea: Secondary | ICD-10-CM

## 2015-08-22 DIAGNOSIS — J189 Pneumonia, unspecified organism: Secondary | ICD-10-CM

## 2015-08-22 DIAGNOSIS — C3411 Malignant neoplasm of upper lobe, right bronchus or lung: Secondary | ICD-10-CM

## 2015-08-22 DIAGNOSIS — R05 Cough: Secondary | ICD-10-CM | POA: Diagnosis not present

## 2015-08-22 DIAGNOSIS — Z5112 Encounter for antineoplastic immunotherapy: Secondary | ICD-10-CM

## 2015-08-22 DIAGNOSIS — C3490 Malignant neoplasm of unspecified part of unspecified bronchus or lung: Secondary | ICD-10-CM | POA: Diagnosis not present

## 2015-08-22 LAB — CBC WITH DIFFERENTIAL/PLATELET
BASO%: 0.4 % (ref 0.0–2.0)
BASOS ABS: 0 10*3/uL (ref 0.0–0.1)
EOS ABS: 0.1 10*3/uL (ref 0.0–0.5)
EOS%: 1.7 % (ref 0.0–7.0)
HEMATOCRIT: 37.6 % (ref 34.8–46.6)
HEMOGLOBIN: 12.4 g/dL (ref 11.6–15.9)
LYMPH%: 18.5 % (ref 14.0–49.7)
MCH: 28 pg (ref 25.1–34.0)
MCHC: 32.9 g/dL (ref 31.5–36.0)
MCV: 85.2 fL (ref 79.5–101.0)
MONO#: 1 10*3/uL — AB (ref 0.1–0.9)
MONO%: 12 % (ref 0.0–14.0)
NEUT%: 67.4 % (ref 38.4–76.8)
NEUTROS ABS: 5.6 10*3/uL (ref 1.5–6.5)
PLATELETS: 281 10*3/uL (ref 145–400)
RBC: 4.41 10*6/uL (ref 3.70–5.45)
RDW: 15.6 % — AB (ref 11.2–14.5)
WBC: 8.3 10*3/uL (ref 3.9–10.3)
lymph#: 1.5 10*3/uL (ref 0.9–3.3)

## 2015-08-22 LAB — COMPREHENSIVE METABOLIC PANEL (CC13)
ALBUMIN: 3.6 g/dL (ref 3.5–5.0)
ALK PHOS: 62 U/L (ref 40–150)
ALT: 13 U/L (ref 0–55)
ANION GAP: 10 meq/L (ref 3–11)
AST: 13 U/L (ref 5–34)
BILIRUBIN TOTAL: 0.77 mg/dL (ref 0.20–1.20)
BUN: 19.5 mg/dL (ref 7.0–26.0)
CALCIUM: 9.7 mg/dL (ref 8.4–10.4)
CO2: 31 mEq/L — ABNORMAL HIGH (ref 22–29)
Chloride: 100 mEq/L (ref 98–109)
Creatinine: 1.2 mg/dL — ABNORMAL HIGH (ref 0.6–1.1)
EGFR: 52 mL/min/{1.73_m2} — AB (ref >90)
GLUCOSE: 165 mg/dL — AB (ref 70–140)
Potassium: 3.3 mEq/L — ABNORMAL LOW (ref 3.5–5.1)
Sodium: 141 mEq/L (ref 136–145)
TOTAL PROTEIN: 6.8 g/dL (ref 6.4–8.3)

## 2015-08-22 NOTE — Telephone Encounter (Signed)
Per staff message and POF I have scheduled appts. Advised scheduler of appts. JMW  

## 2015-08-22 NOTE — Progress Notes (Signed)
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   PRIOR THERAPY:  1. Status post palliative radiotherapy to the left lung mass under the care of Dr. Pablo Ledger completed on 03/06/2012.  2. Systemic chemotherapy with carboplatin for AUC of 6 on day 1 and Abraxane 100 mg/M2 on days 1, 8 and 15 every 3 weeks. Status post 2 cycles. From cycle 3 forward AUC will be decreased to 4.5 given on day 1 and the Abraxane will be decreased to 90 mg per meter squared on days 1, 8 and 15 every 3 weeks, Status post a total of 3 cycles.  3. Systemic chemotherapy with carboplatin for AUC of 5 on day 1 and gemcitabine 1000 mg/m2 given on day 1 and day 8 every 3 weeks,status post 1 cycle. Due to significant neutropenia she will be dosed reduced beginning cycle 2 forward to carboplatin at an AUC of 4 given on day 1 and gemcitabine at 800 mg per meter squared given on days 1 and 8 every 3 weeks. Status post 6 cycles.  4. Status post palliative radiotherapy to the right hilum under the care of Dr. Pablo Ledger completed on 11/26/2013.  CURRENT THERAPY: Immunotherapy with Nivolumab 3 MG/KG every 2 weeks. First dose expected 03/07/2015. Status post 11 cycles.  CHEMOTHERAPY INTENT: Palliative  CURRENT # OF CHEMOTHERAPY CYCLES: 12  CURRENT ANTIEMETICS: Compazine  CURRENT SMOKING STATUS: Former smoker  ORAL CHEMOTHERAPY AND CONSENT: None  CURRENT BISPHOSPHONATES USE: None  PAIN MANAGEMENT: 5/10 right shoulder currently on Norco  NARCOTICS INDUCED CONSTIPATION: None  LIVING WILL AND CODE STATUS: No CODE BLUE   INTERVAL HISTORY: Destiny Harrison 74 y.o. female returns to the clinic today for followup visit accompanied by her son. The patient is currently on immunotherapy with Nivolumab every 2  weeks and tolerating it fairly well. Admitted to [facility] complaining of cough and shortness of breath and she was treated for pneumonia. The patient did not have any fever or chills at that time. She is currently on treatment with Levaquin and tapering doses of prednisone. She is feeling a little bit better. She denied having any significant weight loss or night sweats. She has no chest pain, but has shortness of breath increased with exertion and cough with no hemoptysis. The patient denied having any significant fever or chills, nausea or vomiting. She was supposed to start cycle #12 of her immunotherapy today.  MEDICAL HISTORY: Past Medical History  Diagnosis Date  . COPD (chronic obstructive pulmonary disease) ([place])   . Neutropenia, drug-induced ([place]) 05/05/2012  . Hyperlipidemia   . Rheumatoid arthritis(714.0)   . Asthma   . Hiatal hernia   . History of chemotherapy   . History of radiation therapy 03/06/2012    left hilum  . History of radiation therapy 05/10/2013-05/31/2013    Left lung/ 33/75'@2'$ .25 per fraction x 15 fractions  . Radiation 11/15/13-11/26/13    Right hilum 30 Gy in 10 fractions  . Non-small cell lung cancer ([place]) dx'd 08/28/11    left lung  . Encounter for antineoplastic immunotherapy 05/22/2015  . DNR (do not resuscitate) 06/26/2015    ALLERGIES:  is allergic to shellfish allergy.  MEDICATIONS:  Current Outpatient Prescriptions  Medication Sig Dispense Refill  . albuterol (PROVENTIL) (2.5 MG/3ML) 0.083% nebulizer solution Take 2.5 mg by  nebulization every 8 (eight) hours.    Marland Kitchen albuterol (VENTOLIN HFA) 108 (90 BASE) MCG/ACT inhaler Inhale 2 puffs into the lungs every 3 (three) hours as needed for wheezing or shortness of breath. 1 Inhaler 6  . budesonide (PULMICORT) 0.25 MG/2ML nebulizer solution Take 2 mLs (0.25 mg total) by nebulization 2 (two) times daily. 60 mL 12  . calcium-vitamin D (OSCAL WITH D) 500-200 MG-UNIT per tablet Take 1 tablet by mouth daily.      . chlorpheniramine-HYDROcodone (TUSSIONEX) 10-8 MG/5ML SUER Take 5 mLs by mouth every 8 (eight) hours as needed for cough. 956 mL 0  . folic acid (FOLVITE) 213 MCG tablet Take 400 mcg by mouth daily.    Marland Kitchen ipratropium (ATROVENT) 0.02 % nebulizer solution Take 0.5 mg by nebulization every 8 (eight) hours.    Marland Kitchen levofloxacin (LEVAQUIN) 750 MG tablet Take 1 tablet (750 mg total) by mouth daily. 5 tablet 0  . OXYGEN Inhale 2 L into the lungs continuous.    . predniSONE (DELTASONE) 10 MG tablet Take 4 tablets for 2 days follow by 3 tablets for 2 days follow by 1 tablet daily 30 tablet 0  . simvastatin (ZOCOR) 5 MG tablet Take 10 mg by mouth at bedtime.     Marland Kitchen guaiFENesin (MUCINEX) 600 MG 12 hr tablet Take 1 tablet (600 mg total) by mouth 2 (two) times daily. 30 tablet 1  . HYDROcodone-acetaminophen (NORCO/VICODIN) 5-325 MG tablet Take 1 tablet by mouth every 4 (four) hours as needed for moderate pain. (Patient not taking: Reported on 08/22/2015) 30 tablet 0  . ondansetron (ZOFRAN ODT) 8 MG disintegrating tablet Take 1 tablet (8 mg total) by mouth every 8 (eight) hours as needed for nausea or vomiting. (Patient not taking: Reported on 08/22/2015) 20 tablet 0  . prochlorperazine (COMPAZINE) 10 MG tablet Take 1 tablet (10 mg total) by mouth every 6 (six) hours as needed for nausea or vomiting. (Patient not taking: Reported on 08/22/2015) 30 tablet 0   No current facility-administered medications for this visit.    SURGICAL HISTORY:  Past Surgical History  Procedure Laterality Date  . Appendex  1962  . Video bronchoscopy  01/28/2012    Procedure: VIDEO BRONCHOSCOPY WITHOUT FLUORO;  Surgeon: Brand Males, MD;  Location: [facility] ENDOSCOPY;  Service: Endoscopy;  Laterality: Bilateral;  . Surgery on right wrist      REVIEW OF SYSTEMS:  A comprehensive review of systems was negative except for: Respiratory: positive for cough, dyspnea on exertion, sputum and wheezing   PHYSICAL EXAMINATION: General appearance:  alert, cooperative and no distress Head: Normocephalic, without obvious abnormality, atraumatic Neck: no adenopathy, no JVD, supple, symmetrical, trachea midline and thyroid not enlarged, symmetric, no tenderness/mass/nodules Lymph nodes: Cervical, supraclavicular, and axillary nodes normal. Resp: clear to auscultation bilaterally Back: symmetric, no curvature. ROM normal. No CVA tenderness. Cardio: regular rate and rhythm, S1, S2 normal, no murmur, click, rub or gallop GI: soft, non-tender; bowel sounds normal; no masses,  no organomegaly Extremities: extremities normal, atraumatic, no cyanosis or edema Neurologic: Alert and oriented X 3, normal strength and tone. Normal symmetric reflexes. Normal coordination and gait  ECOG PERFORMANCE STATUS: 1 - Symptomatic but completely ambulatory  Blood pressure 155/67, pulse 116, temperature 98.2 F (36.8 C), temperature source Oral, resp. rate 17, height '5\' 5"'$  (1.651 m), weight 136 lb 3.2 oz (61.78 kg), SpO2 94 %.  LABORATORY DATA: Lab Results  Component Value Date   WBC 8.3 08/22/2015   HGB 12.4 08/22/2015   HCT  37.6 08/22/2015   MCV 85.2 08/22/2015   PLT 281 08/22/2015      Chemistry      Component Value Date/Time   NA 141 08/22/2015 1126   NA 139 08/19/2015 0440   K 3.3* 08/22/2015 1126   K 4.1 08/19/2015 0440   CL 103 08/19/2015 0440   CL 101 01/12/2013 0810   CO2 31* 08/22/2015 1126   CO2 31 08/19/2015 0440   BUN 19.5 08/22/2015 1126   BUN 16 08/19/2015 0440   CREATININE 1.2* 08/22/2015 1126   CREATININE 0.82 08/19/2015 0440      Component Value Date/Time   CALCIUM 9.7 08/22/2015 1126   CALCIUM 8.9 08/19/2015 0440   ALKPHOS 62 08/22/2015 1126   ALKPHOS 72 08/18/2015 0100   AST 13 08/22/2015 1126   AST 22 08/18/2015 0100   ALT 13 08/22/2015 1126   ALT 14 08/18/2015 0100   BILITOT 0.77 08/22/2015 1126   BILITOT 0.9 08/18/2015 0100       RADIOGRAPHIC STUDIES: Dg Chest 2 View  08/18/2015  CLINICAL DATA:  Known  stage IV lung cancer. Acute onset of shortness of breath. Initial encounter. EXAM: CHEST  2 VIEW COMPARISON:  CT of the chest performed 06/22/2015, and chest radiograph performed 06/25/2015 FINDINGS: The patient's known right perihilar mass is perhaps slightly more prominent. The right paratracheal mass is also slightly more prominent. 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 Additional patchy opacity at the right lung base most likely reflects mild pneumonia, though local extension of tumor cannot be entirely excluded. This is new from the prior study. No pleural effusion or pneumothorax is seen. The cardiomediastinal silhouette remains normal in size. A right-sided chest port is noted ending about the mid SVC. No acute osseous abnormalities are identified. IMPRESSION: 1. New patchy opacity at the right lung base most likely reflects mild pneumonia, given the patient's symptoms. 2. Known right perihilar mass and right paratracheal mass are slightly more prominent than on the prior study. Left-sided paramediastinal fibrosis again noted. Electronically Signed   By: Garald Balding M.D.   On: 08/18/2015 01:41   ASSESSMENT AND PLAN: This is a very pleasant 74 years old African American female with metastatic non-small cell lung cancer, squamous cell carcinoma status post several chemotherapy regimen and and completed a course of palliative radiotherapy to the right hilum in February 2015.  She is currently on treatment with immunotherapy with Nivolumab status post 11 cycles. She was recently admitted to [facility] and treated for questionable pneumonia. She still on treatment with Levaquin and tapering doses of prednisone. I recommended for the patient to delay the start of cycle #12 until next week. She would come back for follow-up visit in 3 weeks for evaluation after repeating CT scan of the chest, abdomen and pelvis for restaging of her disease. She was advised to  call immediately if she has any concerning symptoms in the interval.  The patient voices understanding of current disease status and treatment options and is in agreement with the current care plan.  All questions were answered. The patient knows to call the clinic with any problems, questions or concerns. We can certainly see the patient much sooner if necessary.  Disclaimer: This note was dictated with voice recognition software. Similar sounding words can inadvertently be transcribed and may not be corrected upon review.

## 2015-08-22 NOTE — Telephone Encounter (Signed)
per pof to sch pt appt-sent MW email to sch trmt-pt to get update sch on MY CHART-will call after reply for 11/8

## 2015-08-28 DIAGNOSIS — Z87891 Personal history of nicotine dependence: Secondary | ICD-10-CM | POA: Diagnosis not present

## 2015-08-28 DIAGNOSIS — E785 Hyperlipidemia, unspecified: Secondary | ICD-10-CM | POA: Diagnosis not present

## 2015-08-28 DIAGNOSIS — J441 Chronic obstructive pulmonary disease with (acute) exacerbation: Secondary | ICD-10-CM | POA: Diagnosis not present

## 2015-08-28 DIAGNOSIS — D649 Anemia, unspecified: Secondary | ICD-10-CM | POA: Diagnosis not present

## 2015-08-28 DIAGNOSIS — M069 Rheumatoid arthritis, unspecified: Secondary | ICD-10-CM | POA: Diagnosis not present

## 2015-08-28 DIAGNOSIS — Z8701 Personal history of pneumonia (recurrent): Secondary | ICD-10-CM | POA: Diagnosis not present

## 2015-08-28 DIAGNOSIS — Z66 Do not resuscitate: Secondary | ICD-10-CM | POA: Diagnosis not present

## 2015-08-28 DIAGNOSIS — J45909 Unspecified asthma, uncomplicated: Secondary | ICD-10-CM | POA: Diagnosis not present

## 2015-08-28 DIAGNOSIS — C3492 Malignant neoplasm of unspecified part of left bronchus or lung: Secondary | ICD-10-CM | POA: Diagnosis not present

## 2015-08-29 ENCOUNTER — Telehealth: Payer: Self-pay | Admitting: Internal Medicine

## 2015-08-29 ENCOUNTER — Ambulatory Visit (HOSPITAL_BASED_OUTPATIENT_CLINIC_OR_DEPARTMENT_OTHER): Payer: Medicare Other

## 2015-08-29 ENCOUNTER — Other Ambulatory Visit: Payer: Medicare Other

## 2015-08-29 ENCOUNTER — Other Ambulatory Visit (HOSPITAL_BASED_OUTPATIENT_CLINIC_OR_DEPARTMENT_OTHER): Payer: Medicare Other

## 2015-08-29 VITALS — BP 137/64 | HR 86 | Temp 98.8°F | Resp 18

## 2015-08-29 DIAGNOSIS — C3490 Malignant neoplasm of unspecified part of unspecified bronchus or lung: Secondary | ICD-10-CM | POA: Diagnosis not present

## 2015-08-29 DIAGNOSIS — Z5112 Encounter for antineoplastic immunotherapy: Secondary | ICD-10-CM

## 2015-08-29 DIAGNOSIS — C3411 Malignant neoplasm of upper lobe, right bronchus or lung: Secondary | ICD-10-CM

## 2015-08-29 LAB — COMPREHENSIVE METABOLIC PANEL (CC13)
ALBUMIN: 3.5 g/dL (ref 3.5–5.0)
ALK PHOS: 87 U/L (ref 40–150)
ALT: 16 U/L (ref 0–55)
AST: 13 U/L (ref 5–34)
Anion Gap: 8 mEq/L (ref 3–11)
BUN: 13.8 mg/dL (ref 7.0–26.0)
CALCIUM: 9.2 mg/dL (ref 8.4–10.4)
CHLORIDE: 103 meq/L (ref 98–109)
CO2: 29 mEq/L (ref 22–29)
CREATININE: 0.9 mg/dL (ref 0.6–1.1)
EGFR: 75 mL/min/{1.73_m2} — ABNORMAL LOW (ref >90)
GLUCOSE: 177 mg/dL — AB (ref 70–140)
POTASSIUM: 4.1 meq/L (ref 3.5–5.1)
SODIUM: 139 meq/L (ref 136–145)
Total Bilirubin: 0.53 mg/dL (ref 0.20–1.20)
Total Protein: 6.4 g/dL (ref 6.4–8.3)

## 2015-08-29 LAB — CBC WITH DIFFERENTIAL/PLATELET
BASO%: 0.4 % (ref 0.0–2.0)
Basophils Absolute: 0 10*3/uL (ref 0.0–0.1)
EOS%: 0.7 % (ref 0.0–7.0)
Eosinophils Absolute: 0 10*3/uL (ref 0.0–0.5)
HEMATOCRIT: 35.6 % (ref 34.8–46.6)
HEMOGLOBIN: 11.6 g/dL (ref 11.6–15.9)
LYMPH#: 0.6 10*3/uL — AB (ref 0.9–3.3)
LYMPH%: 9.7 % — ABNORMAL LOW (ref 14.0–49.7)
MCH: 28 pg (ref 25.1–34.0)
MCHC: 32.7 g/dL (ref 31.5–36.0)
MCV: 85.8 fL (ref 79.5–101.0)
MONO#: 0.2 10*3/uL (ref 0.1–0.9)
MONO%: 3.7 % (ref 0.0–14.0)
NEUT%: 85.5 % — ABNORMAL HIGH (ref 38.4–76.8)
NEUTROS ABS: 5.7 10*3/uL (ref 1.5–6.5)
Platelets: 259 10*3/uL (ref 145–400)
RBC: 4.15 10*6/uL (ref 3.70–5.45)
RDW: 15.7 % — AB (ref 11.2–14.5)
WBC: 6.7 10*3/uL (ref 3.9–10.3)

## 2015-08-29 MED ORDER — SODIUM CHLORIDE 0.9 % IJ SOLN
10.0000 mL | INTRAMUSCULAR | Status: DC | PRN
  Administered 2015-08-29: 10 mL
  Filled 2015-08-29: qty 10

## 2015-08-29 MED ORDER — SODIUM CHLORIDE 0.9 % IV SOLN
240.0000 mg | Freq: Once | INTRAVENOUS | Status: AC
  Administered 2015-08-29: 240 mg via INTRAVENOUS
  Filled 2015-08-29: qty 24

## 2015-08-29 MED ORDER — SODIUM CHLORIDE 0.9 % IV SOLN
Freq: Once | INTRAVENOUS | Status: AC
  Administered 2015-08-29: 13:00:00 via INTRAVENOUS

## 2015-08-29 MED ORDER — HEPARIN SOD (PORK) LOCK FLUSH 100 UNIT/ML IV SOLN
500.0000 [IU] | Freq: Once | INTRAVENOUS | Status: AC | PRN
  Administered 2015-08-29: 500 [IU]
  Filled 2015-08-29: qty 5

## 2015-08-29 NOTE — Patient Instructions (Signed)
[facility] Discharge Instructions for Patients Receiving Chemotherapy  Today you received the following chemotherapy agents:  Nivolumab.  To help prevent nausea and vomiting after your treatment, we encourage you to take your nausea medication as directed.   If you develop nausea and vomiting that is not controlled by your nausea medication, call the clinic.   BELOW ARE SYMPTOMS THAT SHOULD BE REPORTED IMMEDIATELY:  *FEVER GREATER THAN 100.5 F  *CHILLS WITH OR WITHOUT FEVER  NAUSEA AND VOMITING THAT IS NOT CONTROLLED WITH YOUR NAUSEA MEDICATION  *UNUSUAL SHORTNESS OF BREATH  *UNUSUAL BRUISING OR BLEEDING  TENDERNESS IN MOUTH AND THROAT WITH OR WITHOUT PRESENCE OF ULCERS  *URINARY PROBLEMS  *BOWEL PROBLEMS  UNUSUAL RASH Items with * indicate a potential emergency and should be followed up as soon as possible.  Feel free to call the clinic you have any questions or concerns. The clinic phone number is [phone].  Please show the CHEMO ALERT CARD at check-in to the Emergency Department and triage nurse.   

## 2015-08-31 ENCOUNTER — Encounter: Payer: Self-pay | Admitting: Internal Medicine

## 2015-08-31 ENCOUNTER — Ambulatory Visit (INDEPENDENT_AMBULATORY_CARE_PROVIDER_SITE_OTHER): Payer: Medicare Other | Admitting: Internal Medicine

## 2015-08-31 VITALS — BP 148/70 | HR 120 | Ht 65.0 in | Wt 137.6 lb

## 2015-08-31 DIAGNOSIS — J449 Chronic obstructive pulmonary disease, unspecified: Secondary | ICD-10-CM

## 2015-08-31 DIAGNOSIS — Z23 Encounter for immunization: Secondary | ICD-10-CM | POA: Diagnosis not present

## 2015-08-31 MED ORDER — ALBUTEROL SULFATE HFA 108 (90 BASE) MCG/ACT IN AERS: 2.0000 | INHALATION_SPRAY | Freq: Four times a day (QID) | RESPIRATORY_TRACT | Status: DC | PRN

## 2015-08-31 NOTE — Progress Notes (Signed)
Subjective:     Patient ID: Destiny Harrison, female   DOB: 08-Sep-1941, 74 y.o.   MRN: [id number]  HPI \  OV 08/31/2015  Chief Complaint  Patient presents with  . Follow-up    Pt states overall her breathing is unchanged. Pt states she has good days and bad days. Pt c/o prod cough with clear mucus. Pt denies CP/tightness .   Follow-up moderate COPD  I have not seen Destiny Harrison in a year. She has moderate COPD. She is compliant with her nebulizers. Most recently 08/20/2015 she was discharged after admission for COPD exacerbation. I discussed this restarted her on oxygen which she says is helping her shortness of breath. Currently she feels baseline and healthy relatively speaking. CP Score is at baseline 27 which is high symptom burden. There are no new issues. She's never had a Prevnar vaccine and she is open to having it today  Past medical history  - She has advanced lung cancer. She's currently on immunotherapy Nivolumab since May 2016 fusion every 3 weeks. She is aware of pulmonary toxicity efects. Most recent CT scan chest September 2016 is reported as shrinkage of tumor without evidence of pulmonary toxicity from immunotherapy. Nxt CT scan of the chest is  November 2016. Her oncologist is following this  CAT COPD Symptom & Quality of Life Score ([place]) 0 is no burden. 5 is highest burden 08/31/2015   Never Cough -> Cough all the time 2  No phlegm in chest -> Chest is full of phlegm 3  No chest tightness -> Chest feels very tight 3  No dyspnea for 1 flight stairs/hill -> Very dyspneic for 1 flight of stairs 3  No limitations for ADL at home -> Very limited with ADL at home 4  Confident leaving home -> Not at all confident leaving home 5  Sleep soundly -> Do not sleep soundly because of lung condition 3  Lots of Energy -> No energy at all 4  TOTAL Score (max 40)  27      Immunization History  Administered Date(s) Administered  . Influenza Split 07/23/2012  . Influenza  Whole 09/25/2005, 09/23/2011  . Influenza,inj,Quad PF,36+ Mos 06/29/2013, 08/16/2014  . Influenza-Unspecified 06/22/2015  . Pneumococcal Polysaccharide-23 11/18/2011    Medications Discontinued During This Encounter  Medication Reason  . levofloxacin (LEVAQUIN) 750 MG tablet Completed Course  . predniSONE (DELTASONE) 10 MG tablet Completed Course   Scheduled Meds: Continuous Infusions: PRN Meds:.   Current outpatient prescriptions:  .  albuterol (PROVENTIL) (2.5 MG/3ML) 0.083% nebulizer solution, Take 2.5 mg by nebulization every 8 (eight) hours., Disp: , Rfl:  .  albuterol (VENTOLIN HFA) 108 (90 BASE) MCG/ACT inhaler, Inhale 2 puffs into the lungs every 3 (three) hours as needed for wheezing or shortness of breath., Disp: 1 Inhaler, Rfl: 6 .  budesonide (PULMICORT) 0.25 MG/2ML nebulizer solution, Take 2 mLs (0.25 mg total) by nebulization 2 (two) times daily., Disp: 60 mL, Rfl: 12 .  calcium-vitamin D (OSCAL WITH D) 500-200 MG-UNIT per tablet, Take 1 tablet by mouth daily., Disp: , Rfl:  .  chlorpheniramine-HYDROcodone (TUSSIONEX) 10-8 MG/5ML SUER, Take 5 mLs by mouth every 8 (eight) hours as needed for cough., Disp: 140 mL, Rfl: 0 .  folic acid (FOLVITE) 694 MCG tablet, Take 400 mcg by mouth daily., Disp: , Rfl:  .  guaiFENesin (MUCINEX) 600 MG 12 hr tablet, Take 1 tablet (600 mg total) by mouth 2 (two) times daily., Disp: 30 tablet, Rfl: 1 .  HYDROcodone-acetaminophen (NORCO/VICODIN) 5-325 MG tablet, Take 1 tablet by mouth every 4 (four) hours as needed for moderate pain., Disp: 30 tablet, Rfl: 0 .  ipratropium (ATROVENT) 0.02 % nebulizer solution, Take 0.5 mg by nebulization every 8 (eight) hours., Disp: , Rfl:  .  ondansetron (ZOFRAN ODT) 8 MG disintegrating tablet, Take 1 tablet (8 mg total) by mouth every 8 (eight) hours as needed for nausea or vomiting., Disp: 20 tablet, Rfl: 0 .  OXYGEN, Inhale 2 L into the lungs continuous., Disp: , Rfl:  .  prochlorperazine (COMPAZINE) 10 MG  tablet, Take 1 tablet (10 mg total) by mouth every 6 (six) hours as needed for nausea or vomiting., Disp: 30 tablet, Rfl: 0 .  simvastatin (ZOCOR) 5 MG tablet, Take 10 mg by mouth at bedtime. , Disp: , Rfl:    Review of Systems   According to history of present illness     Objective:   Physical Exam  Constitutional: She is oriented to person, place, and time. She appears well-developed and well-nourished. No distress.  Looks healthier than before  HENT:  Head: Normocephalic and atraumatic.  Right Ear: External ear normal.  Left Ear: External ear normal.  Mouth/Throat: Oropharynx is clear and moist. No oropharyngeal exudate.  Oxygen on  Eyes: Conjunctivae and EOM are normal. Pupils are equal, round, and reactive to light. Right eye exhibits no discharge. Left eye exhibits no discharge. No scleral icterus.  Neck: Normal range of motion. Neck supple. No JVD present. No tracheal deviation present. No thyromegaly present.  Cardiovascular: Normal rate, regular rhythm, normal heart sounds and intact distal pulses.  Exam reveals no gallop and no friction rub.   No murmur heard. Pulmonary/Chest: Effort normal and breath sounds normal. No respiratory distress. She has no wheezes. She has no rales. She exhibits no tenderness.  No wheeze  Abdominal: Soft. Bowel sounds are normal. She exhibits no distension and no mass. There is no tenderness. There is no rebound and no guarding.  Musculoskeletal: Normal range of motion. She exhibits no edema or tenderness.  Lymphadenopathy:    She has no cervical adenopathy.  Neurological: She is alert and oriented to person, place, and time. She has normal reflexes. No cranial nerve deficit. She exhibits normal muscle tone. Coordination normal.  Skin: Skin is warm and dry. No rash noted. She is not diaphoretic. No erythema. No pallor.  Psychiatric: She has a normal mood and affect. Her behavior is normal. Judgment and thought content normal.  Vitals  reviewed.   Filed Vitals:   08/31/15 0902  BP: 148/70  Pulse: 120  Height: '5\' 5"'$  (1.651 m)  Weight: 137 lb 9.6 oz (62.415 kg)  SpO2: 96%        Assessment:       ICD-9-CM ICD-10-CM   1. COPD, moderate ([place]) 496 J44.9    oxygen requirement appears to be new and related to recent COPD exacerbation. No evidence of pulmonary toxicity from immunotherapy. Therefore currently able deem her as having  Stable disease. Recovered from recent exacerbation and of October 2016    Plan:     Stable disease  Plan - Dr. Julien Nordmann will monitor her CT scan in November 2016 for any evidence of pulmonary toxicity -Continue nebulizers and oxygen - Prevnar vaccine today in the office  Follow-up 6 months or sooner if needed  oxygen desaturation test at follow-up in   Dr. Brand Males, M.D., [facility].C.P Pulmonary and Critical Care Medicine Staff Physician [facility] Pager: [phone], If no answer or between  15:00h - 7:00h: call [phone]  08/31/2015 9:22 AM

## 2015-08-31 NOTE — Addendum Note (Signed)
Addended by: Maurice March on: 08/31/2015 09:33 AM   Modules accepted: Orders, Medications

## 2015-08-31 NOTE — Patient Instructions (Addendum)
ICD-9-CM ICD-10-CM   1. COPD, moderate ([place]) 496 J44.9    #COPD  - stable diseaese - continue o2  - continue duoneb 4 times daily - use albuterol HFA as needed;  - PREVNAR vaccine 08/31/2015   #Folllowup 6 months or sooner if needed;  Oxygen desaturation test at follow-up

## 2015-09-01 DIAGNOSIS — C3492 Malignant neoplasm of unspecified part of left bronchus or lung: Secondary | ICD-10-CM | POA: Diagnosis not present

## 2015-09-01 DIAGNOSIS — D649 Anemia, unspecified: Secondary | ICD-10-CM | POA: Diagnosis not present

## 2015-09-01 DIAGNOSIS — Z8701 Personal history of pneumonia (recurrent): Secondary | ICD-10-CM | POA: Diagnosis not present

## 2015-09-01 DIAGNOSIS — J441 Chronic obstructive pulmonary disease with (acute) exacerbation: Secondary | ICD-10-CM | POA: Diagnosis not present

## 2015-09-01 DIAGNOSIS — J45909 Unspecified asthma, uncomplicated: Secondary | ICD-10-CM | POA: Diagnosis not present

## 2015-09-01 DIAGNOSIS — M069 Rheumatoid arthritis, unspecified: Secondary | ICD-10-CM | POA: Diagnosis not present

## 2015-09-05 ENCOUNTER — Ambulatory Visit: Payer: Medicare Other

## 2015-09-05 ENCOUNTER — Other Ambulatory Visit: Payer: Medicare Other

## 2015-09-05 ENCOUNTER — Ambulatory Visit: Payer: Medicare Other | Admitting: Internal Medicine

## 2015-09-05 DIAGNOSIS — Z8701 Personal history of pneumonia (recurrent): Secondary | ICD-10-CM | POA: Diagnosis not present

## 2015-09-05 DIAGNOSIS — J45909 Unspecified asthma, uncomplicated: Secondary | ICD-10-CM | POA: Diagnosis not present

## 2015-09-05 DIAGNOSIS — J441 Chronic obstructive pulmonary disease with (acute) exacerbation: Secondary | ICD-10-CM | POA: Diagnosis not present

## 2015-09-05 DIAGNOSIS — D649 Anemia, unspecified: Secondary | ICD-10-CM | POA: Diagnosis not present

## 2015-09-05 DIAGNOSIS — C3492 Malignant neoplasm of unspecified part of left bronchus or lung: Secondary | ICD-10-CM | POA: Diagnosis not present

## 2015-09-05 DIAGNOSIS — M069 Rheumatoid arthritis, unspecified: Secondary | ICD-10-CM | POA: Diagnosis not present

## 2015-09-11 ENCOUNTER — Other Ambulatory Visit: Payer: Self-pay | Admitting: *Deleted

## 2015-09-11 ENCOUNTER — Telehealth: Payer: Self-pay | Admitting: Internal Medicine

## 2015-09-11 NOTE — Telephone Encounter (Signed)
Aware of added lab appointment

## 2015-09-12 ENCOUNTER — Other Ambulatory Visit (HOSPITAL_BASED_OUTPATIENT_CLINIC_OR_DEPARTMENT_OTHER): Payer: Medicare Other

## 2015-09-12 ENCOUNTER — Ambulatory Visit (HOSPITAL_COMMUNITY)
Admission: RE | Admit: 2015-09-12 | Discharge: 2015-09-12 | Disposition: A | Discharge status: Home / Self Care | Payer: Medicare Other | Source: Ambulatory Visit | Attending: Internal Medicine | Admitting: Internal Medicine

## 2015-09-12 ENCOUNTER — Ambulatory Visit (HOSPITAL_BASED_OUTPATIENT_CLINIC_OR_DEPARTMENT_OTHER): Payer: Medicare Other

## 2015-09-12 ENCOUNTER — Encounter (HOSPITAL_COMMUNITY): Payer: Self-pay

## 2015-09-12 VITALS — BP 116/79 | HR 98 | Temp 97.8°F

## 2015-09-12 DIAGNOSIS — C3411 Malignant neoplasm of upper lobe, right bronchus or lung: Secondary | ICD-10-CM

## 2015-09-12 DIAGNOSIS — Z5112 Encounter for antineoplastic immunotherapy: Secondary | ICD-10-CM

## 2015-09-12 DIAGNOSIS — Z8701 Personal history of pneumonia (recurrent): Secondary | ICD-10-CM | POA: Diagnosis not present

## 2015-09-12 DIAGNOSIS — C3492 Malignant neoplasm of unspecified part of left bronchus or lung: Secondary | ICD-10-CM | POA: Diagnosis not present

## 2015-09-12 DIAGNOSIS — J189 Pneumonia, unspecified organism: Secondary | ICD-10-CM

## 2015-09-12 DIAGNOSIS — R918 Other nonspecific abnormal finding of lung field: Secondary | ICD-10-CM | POA: Diagnosis not present

## 2015-09-12 DIAGNOSIS — Z9225 Personal history of immunosupression therapy: Secondary | ICD-10-CM | POA: Insufficient documentation

## 2015-09-12 DIAGNOSIS — J441 Chronic obstructive pulmonary disease with (acute) exacerbation: Secondary | ICD-10-CM | POA: Diagnosis not present

## 2015-09-12 DIAGNOSIS — R05 Cough: Secondary | ICD-10-CM | POA: Diagnosis not present

## 2015-09-12 DIAGNOSIS — Z9889 Other specified postprocedural states: Secondary | ICD-10-CM | POA: Diagnosis not present

## 2015-09-12 DIAGNOSIS — D649 Anemia, unspecified: Secondary | ICD-10-CM | POA: Diagnosis not present

## 2015-09-12 LAB — COMPREHENSIVE METABOLIC PANEL (CC13)
ALT: 11 U/L (ref 0–55)
AST: 15 U/L (ref 5–34)
Albumin: 3.4 g/dL — ABNORMAL LOW (ref 3.5–5.0)
Alkaline Phosphatase: 68 U/L (ref 40–150)
Anion Gap: 9 mEq/L (ref 3–11)
BUN: 15.2 mg/dL (ref 7.0–26.0)
CHLORIDE: 98 meq/L (ref 98–109)
CO2: 29 meq/L (ref 22–29)
CREATININE: 0.9 mg/dL (ref 0.6–1.1)
Calcium: 9.6 mg/dL (ref 8.4–10.4)
EGFR: 71 mL/min/{1.73_m2} — ABNORMAL LOW (ref >90)
GLUCOSE: 117 mg/dL (ref 70–140)
Potassium: 4.6 mEq/L (ref 3.5–5.1)
SODIUM: 136 meq/L (ref 136–145)
Total Bilirubin: 0.77 mg/dL (ref 0.20–1.20)
Total Protein: 7.1 g/dL (ref 6.4–8.3)

## 2015-09-12 LAB — CBC WITH DIFFERENTIAL/PLATELET
BASO%: 0.2 % (ref 0.0–2.0)
Basophils Absolute: 0 10*3/uL (ref 0.0–0.1)
EOS%: 2.6 % (ref 0.0–7.0)
Eosinophils Absolute: 0.1 10*3/uL (ref 0.0–0.5)
HCT: 34.3 % — ABNORMAL LOW (ref 34.8–46.6)
HGB: 11.7 g/dL (ref 11.6–15.9)
LYMPH%: 23.7 % (ref 14.0–49.7)
MCH: 28.7 pg (ref 25.1–34.0)
MCHC: 34.1 g/dL (ref 31.5–36.0)
MCV: 84.1 fL (ref 79.5–101.0)
MONO#: 0.5 10*3/uL (ref 0.1–0.9)
MONO%: 9.6 % (ref 0.0–14.0)
NEUT%: 63.9 % (ref 38.4–76.8)
NEUTROS ABS: 3.4 10*3/uL (ref 1.5–6.5)
Platelets: 248 10*3/uL (ref 145–400)
RBC: 4.08 10*6/uL (ref 3.70–5.45)
RDW: 13.4 % (ref 11.2–14.5)
WBC: 5.3 10*3/uL (ref 3.9–10.3)
lymph#: 1.3 10*3/uL (ref 0.9–3.3)

## 2015-09-12 MED ORDER — HEPARIN SOD (PORK) LOCK FLUSH 100 UNIT/ML IV SOLN
500.0000 [IU] | Freq: Once | INTRAVENOUS | Status: AC | PRN
  Administered 2015-09-12: 500 [IU]
  Filled 2015-09-12: qty 5

## 2015-09-12 MED ORDER — SODIUM CHLORIDE 0.9 % IJ SOLN
10.0000 mL | INTRAMUSCULAR | Status: DC | PRN
Start: 2015-09-12 — End: 2015-09-12
  Administered 2015-09-12: 10 mL
  Filled 2015-09-12: qty 10

## 2015-09-12 MED ORDER — SODIUM CHLORIDE 0.9 % IV SOLN
Freq: Once | INTRAVENOUS | Status: AC
  Administered 2015-09-12: 11:00:00 via INTRAVENOUS

## 2015-09-12 MED ORDER — SODIUM CHLORIDE 0.9 % IV SOLN
240.0000 mg | Freq: Once | INTRAVENOUS | Status: AC
  Administered 2015-09-12: 240 mg via INTRAVENOUS
  Filled 2015-09-12: qty 20

## 2015-09-12 MED ORDER — IOHEXOL 300 MG/ML  SOLN
100.0000 mL | Freq: Once | INTRAMUSCULAR | Status: AC | PRN
  Administered 2015-09-12: 100 mL via INTRAVENOUS

## 2015-09-12 NOTE — Patient Instructions (Signed)
[facility] Discharge Instructions for Patients Receiving Chemotherapy  Today you received the following chemotherapy agents:  Nivolumab.  To help prevent nausea and vomiting after your treatment, we encourage you to take your nausea medication as directed.   If you develop nausea and vomiting that is not controlled by your nausea medication, call the clinic.   BELOW ARE SYMPTOMS THAT SHOULD BE REPORTED IMMEDIATELY:  *FEVER GREATER THAN 100.5 F  *CHILLS WITH OR WITHOUT FEVER  NAUSEA AND VOMITING THAT IS NOT CONTROLLED WITH YOUR NAUSEA MEDICATION  *UNUSUAL SHORTNESS OF BREATH  *UNUSUAL BRUISING OR BLEEDING  TENDERNESS IN MOUTH AND THROAT WITH OR WITHOUT PRESENCE OF ULCERS  *URINARY PROBLEMS  *BOWEL PROBLEMS  UNUSUAL RASH Items with * indicate a potential emergency and should be followed up as soon as possible.  Feel free to call the clinic you have any questions or concerns. The clinic phone number is [phone].  Please show the CHEMO ALERT CARD at check-in to the Emergency Department and triage nurse.   

## 2015-09-15 DIAGNOSIS — M069 Rheumatoid arthritis, unspecified: Secondary | ICD-10-CM | POA: Diagnosis not present

## 2015-09-15 DIAGNOSIS — C3492 Malignant neoplasm of unspecified part of left bronchus or lung: Secondary | ICD-10-CM | POA: Diagnosis not present

## 2015-09-15 DIAGNOSIS — D649 Anemia, unspecified: Secondary | ICD-10-CM | POA: Diagnosis not present

## 2015-09-15 DIAGNOSIS — J45909 Unspecified asthma, uncomplicated: Secondary | ICD-10-CM | POA: Diagnosis not present

## 2015-09-15 DIAGNOSIS — J441 Chronic obstructive pulmonary disease with (acute) exacerbation: Secondary | ICD-10-CM | POA: Diagnosis not present

## 2015-09-15 DIAGNOSIS — Z8701 Personal history of pneumonia (recurrent): Secondary | ICD-10-CM | POA: Diagnosis not present

## 2015-09-19 ENCOUNTER — Telehealth: Payer: Self-pay | Admitting: *Deleted

## 2015-09-19 ENCOUNTER — Ambulatory Visit (HOSPITAL_BASED_OUTPATIENT_CLINIC_OR_DEPARTMENT_OTHER): Payer: Medicare Other | Admitting: Internal Medicine

## 2015-09-19 ENCOUNTER — Ambulatory Visit: Payer: Medicare Other

## 2015-09-19 ENCOUNTER — Encounter: Payer: Self-pay | Admitting: Internal Medicine

## 2015-09-19 ENCOUNTER — Other Ambulatory Visit (HOSPITAL_BASED_OUTPATIENT_CLINIC_OR_DEPARTMENT_OTHER): Payer: Medicare Other

## 2015-09-19 ENCOUNTER — Telehealth: Payer: Self-pay | Admitting: Internal Medicine

## 2015-09-19 VITALS — BP 116/52 | HR 112 | Temp 98.6°F | Resp 16 | Ht 65.0 in | Wt 135.0 lb

## 2015-09-19 DIAGNOSIS — R05 Cough: Secondary | ICD-10-CM | POA: Diagnosis not present

## 2015-09-19 DIAGNOSIS — C3490 Malignant neoplasm of unspecified part of unspecified bronchus or lung: Secondary | ICD-10-CM

## 2015-09-19 DIAGNOSIS — R0609 Other forms of dyspnea: Secondary | ICD-10-CM

## 2015-09-19 DIAGNOSIS — C3411 Malignant neoplasm of upper lobe, right bronchus or lung: Secondary | ICD-10-CM

## 2015-09-19 LAB — COMPREHENSIVE METABOLIC PANEL (CC13)
ALBUMIN: 3.2 g/dL — AB (ref 3.5–5.0)
ALK PHOS: 66 U/L (ref 40–150)
ALT: 10 U/L (ref 0–55)
ANION GAP: 8 meq/L (ref 3–11)
AST: 13 U/L (ref 5–34)
BILIRUBIN TOTAL: 0.59 mg/dL (ref 0.20–1.20)
BUN: 10.6 mg/dL (ref 7.0–26.0)
CO2: 30 mEq/L — ABNORMAL HIGH (ref 22–29)
Calcium: 9.8 mg/dL (ref 8.4–10.4)
Chloride: 100 mEq/L (ref 98–109)
Creatinine: 0.9 mg/dL (ref 0.6–1.1)
EGFR: 69 mL/min/{1.73_m2} — AB (ref >90)
GLUCOSE: 103 mg/dL (ref 70–140)
POTASSIUM: 4.4 meq/L (ref 3.5–5.1)
SODIUM: 138 meq/L (ref 136–145)
TOTAL PROTEIN: 7.4 g/dL (ref 6.4–8.3)

## 2015-09-19 LAB — CBC WITH DIFFERENTIAL/PLATELET
BASO%: 0.3 % (ref 0.0–2.0)
Basophils Absolute: 0 10*3/uL (ref 0.0–0.1)
EOS%: 1.1 % (ref 0.0–7.0)
Eosinophils Absolute: 0.1 10*3/uL (ref 0.0–0.5)
HCT: 32.8 % — ABNORMAL LOW (ref 34.8–46.6)
HGB: 11.2 g/dL — ABNORMAL LOW (ref 11.6–15.9)
LYMPH%: 21.3 % (ref 14.0–49.7)
MCH: 28.9 pg (ref 25.1–34.0)
MCHC: 34.1 g/dL (ref 31.5–36.0)
MCV: 84.5 fL (ref 79.5–101.0)
MONO#: 0.6 10*3/uL (ref 0.1–0.9)
MONO%: 9.9 % (ref 0.0–14.0)
NEUT%: 67.4 % (ref 38.4–76.8)
NEUTROS ABS: 4.2 10*3/uL (ref 1.5–6.5)
Platelets: 376 10*3/uL (ref 145–400)
RBC: 3.88 10*6/uL (ref 3.70–5.45)
RDW: 13.3 % (ref 11.2–14.5)
WBC: 6.2 10*3/uL (ref 3.9–10.3)
lymph#: 1.3 10*3/uL (ref 0.9–3.3)

## 2015-09-19 MED ORDER — HYDROCODONE-ACETAMINOPHEN 5-325 MG PO TABS: 1.0000 | ORAL_TABLET | ORAL | Status: DC | PRN

## 2015-09-19 MED ORDER — HYDROCOD POLST-CPM POLST ER 10-8 MG/5ML PO SUER: 5.0000 mL | Freq: Three times a day (TID) | ORAL | Status: DC | PRN

## 2015-09-19 NOTE — Telephone Encounter (Signed)
Per staff message and POF I have scheduled appts. Advised scheduler of appts. JMW  

## 2015-09-19 NOTE — Progress Notes (Signed)
[facility] [phone]   Fax:([phone]) [place], [place], [address] [place] [address]  DIAGNOSIS: Metastatic non-small cell lung cancer, squamous cell carcinoma diagnosed in March of 2013.   PRIOR THERAPY:  1. Status post palliative radiotherapy to the left lung mass under the care of Dr. Pablo Ledger completed on 03/06/2012.  2. Systemic chemotherapy with carboplatin for AUC of 6 on day 1 and Abraxane 100 mg/M2 on days 1, 8 and 15 every 3 weeks. Status post 2 cycles. From cycle 3 forward AUC will be decreased to 4.5 given on day 1 and the Abraxane will be decreased to 90 mg per meter squared on days 1, 8 and 15 every 3 weeks, Status post a total of 3 cycles.  3. Systemic chemotherapy with carboplatin for AUC of 5 on day 1 and gemcitabine 1000 mg/m2 given on day 1 and day 8 every 3 weeks,status post 1 cycle. Due to significant neutropenia she will be dosed reduced beginning cycle 2 forward to carboplatin at an AUC of 4 given on day 1 and gemcitabine at 800 mg per meter squared given on days 1 and 8 every 3 weeks. Status post 6 cycles.  4. Status post palliative radiotherapy to the right hilum under the care of Dr. Pablo Ledger completed on 11/26/2013.  CURRENT THERAPY: Immunotherapy with Nivolumab 3 MG/KG every 2 weeks. First dose expected 03/07/2015. Status post 13 cycles.  CHEMOTHERAPY INTENT: Palliative  CURRENT # OF CHEMOTHERAPY CYCLES: 14  CURRENT ANTIEMETICS: Compazine  CURRENT SMOKING STATUS: Former smoker  ORAL CHEMOTHERAPY AND CONSENT: None  CURRENT BISPHOSPHONATES USE: None  PAIN MANAGEMENT: 5/10 right shoulder currently on Norco  NARCOTICS INDUCED CONSTIPATION: None  LIVING WILL AND CODE STATUS: No CODE BLUE   INTERVAL HISTORY: Destiny Harrison 74 y.o. female returns to the clinic today for followup visit accompanied by her son. The patient is currently on immunotherapy with Nivolumab every 2  weeks and tolerating it fairly well. The patient has no complaints today. She denied having any significant weight loss or night sweats. She has no chest pain, but has shortness of breath increased with exertion and cough with no hemoptysis. The patient denied having any significant fever or chills, nausea or vomiting. She had repeat CT scan of the chest, abdomen and pelvis performed recently and she is here for evaluation and discussion of her scan results before starting cycle #14 of her immunotherapy today.  MEDICAL HISTORY: Past Medical History  Diagnosis Date  . COPD (chronic obstructive pulmonary disease) ([facility])   . Neutropenia, drug-induced ([facility]) 05/05/2012  . Hyperlipidemia   . Rheumatoid arthritis(714.0)   . Asthma   . Hiatal hernia   . History of chemotherapy   . History of radiation therapy 03/06/2012    left hilum  . History of radiation therapy 05/10/2013-05/31/2013    Left lung/ 33/75'@2'$ .25 per fraction x 15 fractions  . Radiation 11/15/13-11/26/13    Right hilum 30 Gy in 10 fractions  . Non-small cell lung cancer ([facility]) dx'd 08/28/11    left lung  . Encounter for antineoplastic immunotherapy 05/22/2015  . DNR (do not resuscitate) 06/26/2015    ALLERGIES:  is allergic to shellfish allergy.  MEDICATIONS:  Current Outpatient Prescriptions  Medication Sig Dispense Refill  . albuterol (PROAIR HFA) 108 (90 BASE) MCG/ACT inhaler Inhale 2 puffs into the lungs every 6 (six) hours as needed for wheezing or shortness of breath. 1 Inhaler 5  . albuterol (  PROVENTIL) (2.5 MG/3ML) 0.083% nebulizer solution Take 2.5 mg by nebulization every 8 (eight) hours.    . budesonide (PULMICORT) 0.25 MG/2ML nebulizer solution Take 2 mLs (0.25 mg total) by nebulization 2 (two) times daily. 60 mL 12  . calcium-vitamin D (OSCAL WITH D) 500-200 MG-UNIT per tablet Take 1 tablet by mouth daily.    . chlorpheniramine-HYDROcodone (TUSSIONEX) 10-8 MG/5ML SUER Take 5 mLs by mouth every 8 (eight) hours as needed for  cough. 338 mL 0  . folic acid (FOLVITE) 250 MCG tablet Take 400 mcg by mouth daily.    Marland Kitchen guaiFENesin (MUCINEX) 600 MG 12 hr tablet Take 1 tablet (600 mg total) by mouth 2 (two) times daily. 30 tablet 1  . HYDROcodone-acetaminophen (NORCO/VICODIN) 5-325 MG tablet Take 1 tablet by mouth every 4 (four) hours as needed for moderate pain. 30 tablet 0  . ipratropium (ATROVENT) 0.02 % nebulizer solution Take 0.5 mg by nebulization every 8 (eight) hours.    . ondansetron (ZOFRAN ODT) 8 MG disintegrating tablet Take 1 tablet (8 mg total) by mouth every 8 (eight) hours as needed for nausea or vomiting. 20 tablet 0  . OXYGEN Inhale 2 L into the lungs continuous.    . predniSONE (DELTASONE) 10 MG tablet Take 5 mg by mouth daily.  0  . prochlorperazine (COMPAZINE) 10 MG tablet Take 1 tablet (10 mg total) by mouth every 6 (six) hours as needed for nausea or vomiting. (Patient not taking: Reported on 09/19/2015) 30 tablet 0  . simvastatin (ZOCOR) 5 MG tablet Take 10 mg by mouth at bedtime.      No current facility-administered medications for this visit.    SURGICAL HISTORY:  Past Surgical History  Procedure Laterality Date  . Appendex  1962  . Video bronchoscopy  01/28/2012    Procedure: VIDEO BRONCHOSCOPY WITHOUT FLUORO;  Surgeon: Brand Males, MD;  Location: [facility] ENDOSCOPY;  Service: Endoscopy;  Laterality: Bilateral;  . Surgery on right wrist      REVIEW OF SYSTEMS:  Constitutional: negative Eyes: negative Ears, nose, mouth, throat, and face: negative Respiratory: positive for cough and dyspnea on exertion Cardiovascular: negative Gastrointestinal: negative Genitourinary:negative Integument/breast: negative Hematologic/lymphatic: negative Musculoskeletal:negative Neurological: negative Behavioral/Psych: negative Endocrine: negative Allergic/Immunologic: negative   PHYSICAL EXAMINATION: General appearance: alert, cooperative and no distress Head: Normocephalic, without obvious abnormality,  atraumatic Neck: no adenopathy, no JVD, supple, symmetrical, trachea midline and thyroid not enlarged, symmetric, no tenderness/mass/nodules Lymph nodes: Cervical, supraclavicular, and axillary nodes normal. Resp: clear to auscultation bilaterally Back: symmetric, no curvature. ROM normal. No CVA tenderness. Cardio: regular rate and rhythm, S1, S2 normal, no murmur, click, rub or gallop GI: soft, non-tender; bowel sounds normal; no masses,  no organomegaly Extremities: extremities normal, atraumatic, no cyanosis or edema Neurologic: Alert and oriented X 3, normal strength and tone. Normal symmetric reflexes. Normal coordination and gait  ECOG PERFORMANCE STATUS: 1 - Symptomatic but completely ambulatory  Blood pressure 116/52, pulse 112, temperature 98.6 F (37 C), temperature source Oral, resp. rate 16, height '5\' 5"'$  (1.651 m), weight 135 lb (61.236 kg), SpO2 99 %.  LABORATORY DATA: Lab Results  Component Value Date   WBC 6.2 09/19/2015   HGB 11.2* 09/19/2015   HCT 32.8* 09/19/2015   MCV 84.5 09/19/2015   PLT 376 09/19/2015      Chemistry      Component Value Date/Time   NA 138 09/19/2015 1014   NA 139 08/19/2015 0440   K 4.4 09/19/2015 1014   K 4.1 08/19/2015 0440  CL 103 08/19/2015 0440   CL 101 01/12/2013 0810   CO2 30* 09/19/2015 1014   CO2 31 08/19/2015 0440   BUN 10.6 09/19/2015 1014   BUN 16 08/19/2015 0440   CREATININE 0.9 09/19/2015 1014   CREATININE 0.82 08/19/2015 0440      Component Value Date/Time   CALCIUM 9.8 09/19/2015 1014   CALCIUM 8.9 08/19/2015 0440   ALKPHOS 66 09/19/2015 1014   ALKPHOS 72 08/18/2015 0100   AST 13 09/19/2015 1014   AST 22 08/18/2015 0100   ALT 10 09/19/2015 1014   ALT 14 08/18/2015 0100   BILITOT 0.59 09/19/2015 1014   BILITOT 0.9 08/18/2015 0100       RADIOGRAPHIC STUDIES: Ct Chest W Contrast  09/12/2015  CLINICAL DATA:  Right lung cancer diagnosed 2012, chemotherapy and XRT complete. Immunotherapy in progress. Dry  cough. Prior appendectomy. EXAM: CT CHEST, ABDOMEN, AND PELVIS WITH CONTRAST TECHNIQUE: Multidetector CT imaging of the chest, abdomen and pelvis was performed following the standard protocol during bolus administration of intravenous contrast. CONTRAST:  176m OMNIPAQUE IOHEXOL 300 MG/ML  SOLN COMPARISON:  CT chest dated 06/22/2015. CT chest abdomen pelvis dated 02/09/2015. FINDINGS: CT CHEST FINDINGS Mediastinum/Nodes: The heart is normal in size. No pericardial effusion. Coronary atherosclerosis. Atherosclerotic calcifications of the aortic arch. Right chest port terminates at the cavoatrial junction Thoracic lymphadenopathy, including: --4 mm short axis right supraclavicular node (series 2/ image 3), previously 8 mm --3.0 cm short axis high right paratracheal node (series 2/ image 10), previously 2.4 cm --9 mm short axis subcarinal node (series 2/ image 21), previously 13 mm Visualized thyroid is mildly nodular. Lungs/Pleura: 3.5 x 2.6 cm right middle lobe/perihilar mass (series 4/ image 26), previously 2.2 x 2.0 cm. Associated radiation changes. Additional radiation changes in the left paramediastinal region. Underlying moderate centrilobular and paraseptal emphysematous changes. Biapical pleural-parenchymal scarring. No focal consolidation. No pleural effusion or pneumothorax. Musculoskeletal: Moderate degenerative changes of the mid thoracic spine. Mild to moderate superior endplate compression fracture deformity at T4, unchanged. Mild superior endplate compression fracture deformity at T5, stable versus mildly increased. 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 No suspicious hepatic lesions. Gallbladder is unremarkable. No intrahepatic or extrahepatic ductal dilatation. Pancreas: Within normal limits. Spleen: Within normal limits. Adrenals/Urinary Tract: Adrenal glands are within normal limits. Mild scarring of the anterior left lower  kidney. No enhancing renal lesions. No hydronephrosis. Bladder is within normal limits. Stomach/Bowel: Stomach is within normal limits. No evidence of bowel obstruction. Prior appendectomy. Mild to moderate left colonic stool burden. Vascular/Lymphatic: Atherosclerotic calcifications of the abdominal aorta and branch vessels. No suspicious abdominopelvic lymphadenopathy. Reproductive: Uterus is notable for a 10 mm subserosal fibroid in the anterior fundus (series 2/image 92). Bilateral ovaries are within normal limits Other: No abdominopelvic ascites. Musculoskeletal: Degenerative changes of the lumbar spine, most prominent at L4-5. Stable mixed lytic/sclerotic process with coarse trabeculations involving the right proximal humerus, suggesting Paget's disease. IMPRESSION: 3.5 x 2.6 cm right middle lobe/perihilar mass, mildly increased. Mixed response of thoracic lymphadenopathy. Dominant 3.0 cm short axis high right paratracheal node is mildly increased. No evidence of metastatic disease in the abdomen/pelvis. Additional ancillary findings as above. Electronically Signed   By: SJulian HyM.D.   On: 09/12/2015 09:38   Ct Abdomen Pelvis W Contrast  09/12/2015  CLINICAL DATA:  Right lung cancer diagnosed 2012, chemotherapy and XRT complete. Immunotherapy in progress. Dry cough. Prior appendectomy. EXAM: CT CHEST, ABDOMEN, AND PELVIS  WITH CONTRAST TECHNIQUE: Multidetector CT imaging of the chest, abdomen and pelvis was performed following the standard protocol during bolus administration of intravenous contrast. CONTRAST:  151m OMNIPAQUE IOHEXOL 300 MG/ML  SOLN COMPARISON:  CT chest dated 06/22/2015. CT chest abdomen pelvis dated 02/09/2015. FINDINGS: CT CHEST FINDINGS Mediastinum/Nodes: The heart is normal in size. No pericardial effusion. Coronary atherosclerosis. Atherosclerotic calcifications of the aortic arch. Right chest port terminates at the cavoatrial junction Thoracic lymphadenopathy, including:  --4 mm short axis right supraclavicular node (series 2/ image 3), previously 8 mm --3.0 cm short axis high right paratracheal node (series 2/ image 10), previously 2.4 cm --9 mm short axis subcarinal node (series 2/ image 21), previously 13 mm Visualized thyroid is mildly nodular. Lungs/Pleura: 3.5 x 2.6 cm right middle lobe/perihilar mass (series 4/ image 26), previously 2.2 x 2.0 cm. Associated radiation changes. Additional radiation changes in the left paramediastinal region. Underlying moderate centrilobular and paraseptal emphysematous changes. Biapical pleural-parenchymal scarring. No focal consolidation. No pleural effusion or pneumothorax. Musculoskeletal: Moderate degenerative changes of the mid thoracic spine. Mild to moderate superior endplate compression fracture deformity at T4, unchanged. Mild superior endplate compression fracture deformity at T5, stable versus mildly increased. 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 No suspicious hepatic lesions. Gallbladder is unremarkable. No intrahepatic or extrahepatic ductal dilatation. Pancreas: Within normal limits. Spleen: Within normal limits. Adrenals/Urinary Tract: Adrenal glands are within normal limits. Mild scarring of the anterior left lower kidney. No enhancing renal lesions. No hydronephrosis. Bladder is within normal limits. Stomach/Bowel: Stomach is within normal limits. No evidence of bowel obstruction. Prior appendectomy. Mild to moderate left colonic stool burden. Vascular/Lymphatic: Atherosclerotic calcifications of the abdominal aorta and branch vessels. No suspicious abdominopelvic lymphadenopathy. Reproductive: Uterus is notable for a 10 mm subserosal fibroid in the anterior fundus (series 2/image 92). Bilateral ovaries are within normal limits Other: No abdominopelvic ascites. Musculoskeletal: Degenerative changes of the lumbar spine, most prominent at L4-5. Stable  mixed lytic/sclerotic process with coarse trabeculations involving the right proximal humerus, suggesting Paget's disease. IMPRESSION: 3.5 x 2.6 cm right middle lobe/perihilar mass, mildly increased. Mixed response of thoracic lymphadenopathy. Dominant 3.0 cm short axis high right paratracheal node is mildly increased. No evidence of metastatic disease in the abdomen/pelvis. Additional ancillary findings as above. Electronically Signed   By: SJulian HyM.D.   On: 09/12/2015 09:38   ASSESSMENT AND PLAN: This is a very pleasant 74years old African American female with metastatic non-small cell lung cancer, squamous cell carcinoma status post several chemotherapy regimen and and completed a course of palliative radiotherapy to the right hilum in February 2015.  She is currently on treatment with immunotherapy with Nivolumab status post 13 cycles. The patient is doing fine and tolerating her treatment fairly well. The recent CT scan of the chest, abdomen and pelvis showed mild increase in the right middle lobe and mediastinal lymphadenopathy but not enough to meet criteria for disease progression. I personally reviewed the scan images. I recommended for the patient to continue with her current treatment with Nivolumab. She will receive cycle #14 as scheduled next week. The patient would come back for follow-up visit in 3 weeks for reevaluation before starting cycle #15. She was giving a refill for Percocet and Tussinox She was advised to call immediately if she has any concerning symptoms in the interval.  The patient voices understanding of current disease status and treatment options and is in agreement with the current care  plan.  All questions were answered. The patient knows to call the clinic with any problems, questions or concerns. We can certainly see the patient much sooner if necessary.  Disclaimer: This note was dictated with voice recognition software. Similar sounding words can  inadvertently be transcribed and may not be corrected upon review.

## 2015-09-19 NOTE — Telephone Encounter (Signed)
per pof to sch pt trmt -sent Mw email to sch pt trmt-willcall pt after reply

## 2015-09-26 ENCOUNTER — Other Ambulatory Visit (HOSPITAL_BASED_OUTPATIENT_CLINIC_OR_DEPARTMENT_OTHER): Payer: Medicare Other

## 2015-09-26 DIAGNOSIS — C3411 Malignant neoplasm of upper lobe, right bronchus or lung: Secondary | ICD-10-CM | POA: Diagnosis present

## 2015-09-26 LAB — CBC WITH DIFFERENTIAL/PLATELET
BASO%: 0.6 % (ref 0.0–2.0)
BASOS ABS: 0 10*3/uL (ref 0.0–0.1)
EOS ABS: 0.1 10*3/uL (ref 0.0–0.5)
EOS%: 1 % (ref 0.0–7.0)
HCT: 32.9 % — ABNORMAL LOW (ref 34.8–46.6)
HGB: 11 g/dL — ABNORMAL LOW (ref 11.6–15.9)
LYMPH%: 19.6 % (ref 14.0–49.7)
MCH: 28.6 pg (ref 25.1–34.0)
MCHC: 33.4 g/dL (ref 31.5–36.0)
MCV: 85.5 fL (ref 79.5–101.0)
MONO#: 0.7 10*3/uL (ref 0.1–0.9)
MONO%: 10.3 % (ref 0.0–14.0)
NEUT%: 68.5 % (ref 38.4–76.8)
NEUTROS ABS: 4.5 10*3/uL (ref 1.5–6.5)
PLATELETS: 390 10*3/uL (ref 145–400)
RBC: 3.85 10*6/uL (ref 3.70–5.45)
RDW: 14.1 % (ref 11.2–14.5)
WBC: 6.5 10*3/uL (ref 3.9–10.3)
lymph#: 1.3 10*3/uL (ref 0.9–3.3)

## 2015-09-26 LAB — COMPREHENSIVE METABOLIC PANEL
ANION GAP: 9 meq/L (ref 3–11)
AST: 13 U/L (ref 5–34)
Albumin: 3.3 g/dL — ABNORMAL LOW (ref 3.5–5.0)
Alkaline Phosphatase: 76 U/L (ref 40–150)
BILIRUBIN TOTAL: 0.41 mg/dL (ref 0.20–1.20)
BUN: 10.9 mg/dL (ref 7.0–26.0)
CO2: 28 meq/L (ref 22–29)
Calcium: 9.5 mg/dL (ref 8.4–10.4)
Chloride: 102 mEq/L (ref 98–109)
Creatinine: 0.9 mg/dL (ref 0.6–1.1)
EGFR: 71 mL/min/{1.73_m2} — ABNORMAL LOW (ref >90)
GLUCOSE: 138 mg/dL (ref 70–140)
Potassium: 4.1 mEq/L (ref 3.5–5.1)
Sodium: 139 mEq/L (ref 136–145)
TOTAL PROTEIN: 7.3 g/dL (ref 6.4–8.3)

## 2015-10-10 ENCOUNTER — Encounter: Payer: Self-pay | Admitting: Physician Assistant

## 2015-10-10 ENCOUNTER — Other Ambulatory Visit (HOSPITAL_BASED_OUTPATIENT_CLINIC_OR_DEPARTMENT_OTHER): Payer: Medicare Other

## 2015-10-10 ENCOUNTER — Telehealth: Payer: Self-pay | Admitting: Internal Medicine

## 2015-10-10 ENCOUNTER — Ambulatory Visit (HOSPITAL_BASED_OUTPATIENT_CLINIC_OR_DEPARTMENT_OTHER): Payer: Medicare Other | Admitting: Physician Assistant

## 2015-10-10 ENCOUNTER — Ambulatory Visit (HOSPITAL_BASED_OUTPATIENT_CLINIC_OR_DEPARTMENT_OTHER): Payer: Medicare Other

## 2015-10-10 ENCOUNTER — Other Ambulatory Visit: Payer: Self-pay | Admitting: Physician Assistant

## 2015-10-10 VITALS — BP 126/59 | HR 100 | Temp 98.3°F | Resp 17 | Ht 65.0 in | Wt 137.9 lb

## 2015-10-10 DIAGNOSIS — Z5112 Encounter for antineoplastic immunotherapy: Secondary | ICD-10-CM

## 2015-10-10 DIAGNOSIS — Z79899 Other long term (current) drug therapy: Secondary | ICD-10-CM | POA: Diagnosis not present

## 2015-10-10 DIAGNOSIS — C3490 Malignant neoplasm of unspecified part of unspecified bronchus or lung: Secondary | ICD-10-CM

## 2015-10-10 DIAGNOSIS — C3411 Malignant neoplasm of upper lobe, right bronchus or lung: Secondary | ICD-10-CM

## 2015-10-10 DIAGNOSIS — R0609 Other forms of dyspnea: Secondary | ICD-10-CM

## 2015-10-10 DIAGNOSIS — E032 Hypothyroidism due to medicaments and other exogenous substances: Secondary | ICD-10-CM

## 2015-10-10 LAB — CBC WITH DIFFERENTIAL/PLATELET
BASO%: 0.5 % (ref 0.0–2.0)
Basophils Absolute: 0 10*3/uL (ref 0.0–0.1)
EOS%: 2.3 % (ref 0.0–7.0)
Eosinophils Absolute: 0.1 10*3/uL (ref 0.0–0.5)
HEMATOCRIT: 34.2 % — AB (ref 34.8–46.6)
HGB: 11.2 g/dL — ABNORMAL LOW (ref 11.6–15.9)
LYMPH%: 23.5 % (ref 14.0–49.7)
MCH: 27.6 pg (ref 25.1–34.0)
MCHC: 32.6 g/dL (ref 31.5–36.0)
MCV: 84.6 fL (ref 79.5–101.0)
MONO#: 0.6 10*3/uL (ref 0.1–0.9)
MONO%: 11.1 % (ref 0.0–14.0)
NEUT#: 3.7 10*3/uL (ref 1.5–6.5)
NEUT%: 62.6 % (ref 38.4–76.8)
PLATELETS: 250 10*3/uL (ref 145–400)
RBC: 4.04 10*6/uL (ref 3.70–5.45)
RDW: 14.5 % (ref 11.2–14.5)
WBC: 5.8 10*3/uL (ref 3.9–10.3)
lymph#: 1.4 10*3/uL (ref 0.9–3.3)

## 2015-10-10 LAB — COMPREHENSIVE METABOLIC PANEL
ALT: 10 U/L (ref 0–55)
ANION GAP: 9 meq/L (ref 3–11)
AST: 15 U/L (ref 5–34)
Albumin: 3.6 g/dL (ref 3.5–5.0)
Alkaline Phosphatase: 67 U/L (ref 40–150)
BILIRUBIN TOTAL: 0.56 mg/dL (ref 0.20–1.20)
BUN: 10.9 mg/dL (ref 7.0–26.0)
CALCIUM: 9.5 mg/dL (ref 8.4–10.4)
CHLORIDE: 100 meq/L (ref 98–109)
CO2: 29 meq/L (ref 22–29)
CREATININE: 1 mg/dL (ref 0.6–1.1)
EGFR: 67 mL/min/{1.73_m2} — AB (ref >90)
Glucose: 107 mg/dl (ref 70–140)
Potassium: 3.9 mEq/L (ref 3.5–5.1)
Sodium: 138 mEq/L (ref 136–145)
Total Protein: 7.5 g/dL (ref 6.4–8.3)

## 2015-10-10 LAB — TSH: TSH: 1.191 m[IU]/L (ref 0.308–3.960)

## 2015-10-10 MED ORDER — SODIUM CHLORIDE 0.9 % IV SOLN
Freq: Once | INTRAVENOUS | Status: AC
  Administered 2015-10-10: 11:00:00 via INTRAVENOUS

## 2015-10-10 MED ORDER — HYDROCOD POLST-CPM POLST ER 10-8 MG/5ML PO SUER: 5.0000 mL | Freq: Three times a day (TID) | ORAL | Status: DC | PRN

## 2015-10-10 MED ORDER — SODIUM CHLORIDE 0.9 % IJ SOLN
10.0000 mL | INTRAMUSCULAR | Status: DC | PRN
  Administered 2015-10-10: 10 mL
  Filled 2015-10-10: qty 10

## 2015-10-10 MED ORDER — HYDROCODONE-ACETAMINOPHEN 5-325 MG PO TABS: 1.0000 | ORAL_TABLET | ORAL | Status: DC | PRN

## 2015-10-10 MED ORDER — SODIUM CHLORIDE 0.9 % IV SOLN
240.0000 mg | Freq: Once | INTRAVENOUS | Status: AC
  Administered 2015-10-10: 240 mg via INTRAVENOUS
  Filled 2015-10-10: qty 24

## 2015-10-10 MED ORDER — HEPARIN SOD (PORK) LOCK FLUSH 100 UNIT/ML IV SOLN
500.0000 [IU] | Freq: Once | INTRAVENOUS | Status: AC | PRN
  Administered 2015-10-10: 500 [IU]
  Filled 2015-10-10: qty 5

## 2015-10-10 NOTE — Telephone Encounter (Signed)
Gave patient avs report and appointments for January. Added additional cycles due to provider availability.

## 2015-10-10 NOTE — Progress Notes (Signed)
[place] [phone]   Fax:([phone]) 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   PRIOR THERAPY:  1. Status post palliative radiotherapy to the left lung mass under the care of Dr. Pablo Ledger completed on 03/06/2012.  2. Systemic chemotherapy with carboplatin for AUC of 6 on day 1 and Abraxane 100 mg/M2 on days 1, 8 and 15 every 3 weeks. Status post 2 cycles. From cycle 3 forward AUC will be decreased to 4.5 given on day 1 and the Abraxane will be decreased to 90 mg per meter squared on days 1, 8 and 15 every 3 weeks, Status post a total of 3 cycles.  3. Systemic chemotherapy with carboplatin for AUC of 5 on day 1 and gemcitabine 1000 mg/m2 given on day 1 and day 8 every 3 weeks,status post 1 cycle. Due to significant neutropenia she will be dosed reduced beginning cycle 2 forward to carboplatin at an AUC of 4 given on day 1 and gemcitabine at 800 mg per meter squared given on days 1 and 8 every 3 weeks. Status post 6 cycles.  4. Status post palliative radiotherapy to the right hilum under the care of Dr. Pablo Ledger completed on 11/26/2013.  CURRENT THERAPY: Immunotherapy with Nivolumab 3 MG/KG every 2 weeks. First dose expected 03/07/2015. Status post 14 cycles.  CHEMOTHERAPY INTENT: Palliative  CURRENT # OF CHEMOTHERAPY CYCLES: 14  CURRENT ANTIEMETICS: Compazine  CURRENT SMOKING STATUS: Former smoker  ORAL CHEMOTHERAPY AND CONSENT: None  CURRENT BISPHOSPHONATES USE: None  PAIN MANAGEMENT: 5/10 right shoulder currently on Norco  NARCOTICS INDUCED CONSTIPATION: None  LIVING WILL AND CODE STATUS: No CODE BLUE   INTERVAL HISTORY: Destiny Harrison 74 y.o. female returns to the clinic today for followup visit accompanied by her son. The patient is currently on immunotherapy with Nivolumab every 2  weeks and tolerating it fairly well. The patient has no complaints today. She denied having any significant weight loss or night sweats. She has no chest pain, but has shortness of breath increased with exertion and cough with no hemoptysis. The patient denied having any significant fever or chills, nausea or vomiting. Denies any bleeding issues. She is here for cycle #14 of her immunotherapy today.  MEDICAL HISTORY: Past Medical History  Diagnosis Date  . COPD (chronic obstructive pulmonary disease) ([place])   . Neutropenia, drug-induced ([place]) 05/05/2012  . Hyperlipidemia   . Rheumatoid arthritis(714.0)   . Asthma   . Hiatal hernia   . History of chemotherapy   . History of radiation therapy 03/06/2012    left hilum  . History of radiation therapy 05/10/2013-05/31/2013    Left lung/ 33/75'@2'$ .25 per fraction x 15 fractions  . Radiation 11/15/13-11/26/13    Right hilum 30 Gy in 10 fractions  . Non-small cell lung cancer ([place]) dx'd 08/28/11    left lung  . Encounter for antineoplastic immunotherapy 05/22/2015  . DNR (do not resuscitate) 06/26/2015    ALLERGIES:  is allergic to shellfish allergy.  MEDICATIONS:  Current Outpatient Prescriptions  Medication Sig Dispense Refill  . albuterol (PROAIR HFA) 108 (90 BASE) MCG/ACT inhaler Inhale 2 puffs into the lungs every 6 (six) hours as needed for wheezing or shortness of breath. 1 Inhaler 5  . albuterol (PROVENTIL) (2.5 MG/3ML) 0.083% nebulizer solution Take 2.5 mg by nebulization every 8 (eight) hours.    Marland Kitchen  budesonide (PULMICORT) 0.25 MG/2ML nebulizer solution Take 2 mLs (0.25 mg total) by nebulization 2 (two) times daily. 60 mL 12  . calcium-vitamin D (OSCAL WITH D) 500-200 MG-UNIT per tablet Take 1 tablet by mouth daily.    . chlorpheniramine-HYDROcodone (TUSSIONEX) 10-8 MG/5ML SUER Take 5 mLs by mouth every 8 (eight) hours as needed for cough. 315 mL 0  . folic acid (FOLVITE) 400 MCG tablet Take 400 mcg by mouth daily.    Marland Kitchen guaiFENesin (MUCINEX) 600  MG 12 hr tablet Take 1 tablet (600 mg total) by mouth 2 (two) times daily. 30 tablet 1  . HYDROcodone-acetaminophen (NORCO/VICODIN) 5-325 MG tablet Take 1 tablet by mouth every 4 (four) hours as needed for moderate pain. 30 tablet 0  . ipratropium (ATROVENT) 0.02 % nebulizer solution Take 0.5 mg by nebulization every 8 (eight) hours.    . ondansetron (ZOFRAN ODT) 8 MG disintegrating tablet Take 1 tablet (8 mg total) by mouth every 8 (eight) hours as needed for nausea or vomiting. 20 tablet 0  . OXYGEN Inhale 2 L into the lungs continuous.    . predniSONE (DELTASONE) 10 MG tablet Take 5 mg by mouth daily.  0  . prochlorperazine (COMPAZINE) 10 MG tablet Take 1 tablet (10 mg total) by mouth every 6 (six) hours as needed for nausea or vomiting. (Patient not taking: Reported on 09/19/2015) 30 tablet 0  . simvastatin (ZOCOR) 5 MG tablet Take 10 mg by mouth at bedtime.      No current facility-administered medications for this visit.    SURGICAL HISTORY:  Past Surgical History  Procedure Laterality Date  . Appendex  1962  . Video bronchoscopy  01/28/2012    Procedure: VIDEO BRONCHOSCOPY WITHOUT FLUORO;  Surgeon: Brand Males, MD;  Location: [facility] ENDOSCOPY;  Service: Endoscopy;  Laterality: Bilateral;  . Surgery on right wrist      REVIEW OF SYSTEMS:  Constitutional: negative Eyes: negative Ears, nose, mouth, throat, and face: negative Respiratory: positive for cough and dyspnea on exertion Cardiovascular: negative Gastrointestinal: negative Genitourinary:negative Integument/breast: negative Hematologic/lymphatic: negative Musculoskeletal:negative Neurological: negative Behavioral/Psych: negative Endocrine: negative Allergic/Immunologic: negative   PHYSICAL EXAMINATION: General appearance: alert, cooperative and no distress Head: Normocephalic, without obvious abnormality, atraumatic Neck: no adenopathy, no JVD, supple, symmetrical, trachea midline and thyroid not enlarged, symmetric,  no tenderness/mass/nodules Lymph nodes: Cervical, supraclavicular, and axillary nodes normal. Resp: clear to auscultation bilaterally Back: symmetric, no curvature. ROM normal. No CVA tenderness. Cardio: regular rate and rhythm, S1, S2 normal, no murmur, click, rub or gallop GI: soft, non-tender; bowel sounds normal; no masses,  no organomegaly Extremities: extremities normal, atraumatic, no cyanosis or edema Neurologic: Alert and oriented X 3, normal strength and tone. Normal symmetric reflexes. Normal coordination and gait  ECOG PERFORMANCE STATUS: 1 - Symptomatic but completely ambulatory  Blood pressure 126/59, pulse 100, temperature 98.3 F (36.8 C), temperature source Oral, resp. rate 17, height '5\' 5"'$  (1.651 m), weight 137 lb 14.4 oz (62.551 kg), SpO2 99 %.  LABORATORY DATA: CBC Latest Ref Rng 10/10/2015 09/26/2015 09/19/2015  WBC 3.9 - 10.3 10e3/uL 5.8 6.5 6.2  Hemoglobin 11.6 - 15.9 g/dL 11.2(L) 11.0(L) 11.2(L)  Hematocrit 34.8 - 46.6 % 34.2(L) 32.9(L) 32.8(L)  Platelets 145 - 400 10e3/uL 250 390 376   CMP Latest Ref Rng 09/26/2015 09/19/2015 09/12/2015  Glucose 70 - 140 mg/dl 138 103 117  BUN 7.0 - 26.0 mg/dL 10.9 10.6 15.2  Creatinine 0.6 - 1.1 mg/dL 0.9 0.9 0.9  Sodium 136 - 145 mEq/L 139  138 136  Potassium 3.5 - 5.1 mEq/L 4.1 4.4 4.6  Chloride 101 - 111 mmol/L - - -  CO2 22 - 29 mEq/L 28 30(H) 29  Calcium 8.4 - 10.4 mg/dL 9.5 9.8 9.6  Total Protein 6.4 - 8.3 g/dL 7.3 7.4 7.1  Total Bilirubin 0.20 - 1.20 mg/dL 0.41 0.59 0.77  Alkaline Phos 40 - 150 U/L 76 66 68  AST 5 - 34 U/L '13 13 15  '$ ALT 0 - 55 U/L '9 10 11    '$ RADIOGRAPHIC STUDIES: Ct Chest W Contrast  09/12/2015  CLINICAL DATA:  Right lung cancer diagnosed 2012, chemotherapy and XRT complete. Immunotherapy in progress. Dry cough. Prior appendectomy. EXAM: CT CHEST, ABDOMEN, AND PELVIS WITH CONTRAST TECHNIQUE: Multidetector CT imaging of the chest, abdomen and pelvis was performed following the standard protocol  during bolus administration of intravenous contrast. CONTRAST:  158m OMNIPAQUE IOHEXOL 300 MG/ML  SOLN COMPARISON:  CT chest dated 06/22/2015. CT chest abdomen pelvis dated 02/09/2015. FINDINGS: CT CHEST FINDINGS Mediastinum/Nodes: The heart is normal in size. No pericardial effusion. Coronary atherosclerosis. Atherosclerotic calcifications of the aortic arch. Right chest port terminates at the cavoatrial junction Thoracic lymphadenopathy, including: --4 mm short axis right supraclavicular node (series 2/ image 3), previously 8 mm --3.0 cm short axis high right paratracheal node (series 2/ image 10), previously 2.4 cm --9 mm short axis subcarinal node (series 2/ image 21), previously 13 mm Visualized thyroid is mildly nodular. Lungs/Pleura: 3.5 x 2.6 cm right middle lobe/perihilar mass (series 4/ image 26), previously 2.2 x 2.0 cm. Associated radiation changes. Additional radiation changes in the left paramediastinal region. Underlying moderate centrilobular and paraseptal emphysematous changes. Biapical pleural-parenchymal scarring. No focal consolidation. No pleural effusion or pneumothorax. Musculoskeletal: Moderate degenerative changes of the mid thoracic spine. Mild to moderate superior endplate compression fracture deformity at T4, unchanged. Mild superior endplate compression fracture deformity at T5, stable versus mildly increased. 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 No suspicious hepatic lesions. Gallbladder is unremarkable. No intrahepatic or extrahepatic ductal dilatation. Pancreas: Within normal limits. Spleen: Within normal limits. Adrenals/Urinary Tract: Adrenal glands are within normal limits. Mild scarring of the anterior left lower kidney. No enhancing renal lesions. No hydronephrosis. Bladder is within normal limits. Stomach/Bowel: Stomach is within normal limits. No evidence of bowel obstruction. Prior appendectomy. Mild  to moderate left colonic stool burden. Vascular/Lymphatic: Atherosclerotic calcifications of the abdominal aorta and branch vessels. No suspicious abdominopelvic lymphadenopathy. Reproductive: Uterus is notable for a 10 mm subserosal fibroid in the anterior fundus (series 2/image 92). Bilateral ovaries are within normal limits Other: No abdominopelvic ascites. Musculoskeletal: Degenerative changes of the lumbar spine, most prominent at L4-5. Stable mixed lytic/sclerotic process with coarse trabeculations involving the right proximal humerus, suggesting Paget's disease. IMPRESSION: 3.5 x 2.6 cm right middle lobe/perihilar mass, mildly increased. Mixed response of thoracic lymphadenopathy. Dominant 3.0 cm short axis high right paratracheal node is mildly increased. No evidence of metastatic disease in the abdomen/pelvis. Additional ancillary findings as above. Electronically Signed   By: SJulian HyM.D.   On: 09/12/2015 09:38   Ct Abdomen Pelvis W Contrast  09/12/2015  CLINICAL DATA:  Right lung cancer diagnosed 2012, chemotherapy and XRT complete. Immunotherapy in progress. Dry cough. Prior appendectomy. EXAM: CT CHEST, ABDOMEN, AND PELVIS WITH CONTRAST TECHNIQUE: Multidetector CT imaging of the chest, abdomen and pelvis was performed following the standard protocol during bolus administration of intravenous contrast. CONTRAST:  1074mOMNIPAQUE IOHEXOL 300 MG/ML  SOLN COMPARISON:  CT chest dated 06/22/2015. CT chest abdomen pelvis dated 02/09/2015. FINDINGS: CT CHEST FINDINGS Mediastinum/Nodes: The heart is normal in size. No pericardial effusion. Coronary atherosclerosis. Atherosclerotic calcifications of the aortic arch. Right chest port terminates at the cavoatrial junction Thoracic lymphadenopathy, including: --4 mm short axis right supraclavicular node (series 2/ image 3), previously 8 mm --3.0 cm short axis high right paratracheal node (series 2/ image 10), previously 2.4 cm --9 mm short axis  subcarinal node (series 2/ image 21), previously 13 mm Visualized thyroid is mildly nodular. Lungs/Pleura: 3.5 x 2.6 cm right middle lobe/perihilar mass (series 4/ image 26), previously 2.2 x 2.0 cm. Associated radiation changes. Additional radiation changes in the left paramediastinal region. Underlying moderate centrilobular and paraseptal emphysematous changes. Biapical pleural-parenchymal scarring. No focal consolidation. No pleural effusion or pneumothorax. Musculoskeletal: Moderate degenerative changes of the mid thoracic spine. Mild to moderate superior endplate compression fracture deformity at T4, unchanged. Mild superior endplate compression fracture deformity at T5, stable versus mildly increased. 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 No suspicious hepatic lesions. Gallbladder is unremarkable. No intrahepatic or extrahepatic ductal dilatation. Pancreas: Within normal limits. Spleen: Within normal limits. Adrenals/Urinary Tract: Adrenal glands are within normal limits. Mild scarring of the anterior left lower kidney. No enhancing renal lesions. No hydronephrosis. Bladder is within normal limits. Stomach/Bowel: Stomach is within normal limits. No evidence of bowel obstruction. Prior appendectomy. Mild to moderate left colonic stool burden. Vascular/Lymphatic: Atherosclerotic calcifications of the abdominal aorta and branch vessels. No suspicious abdominopelvic lymphadenopathy. Reproductive: Uterus is notable for a 10 mm subserosal fibroid in the anterior fundus (series 2/image 92). Bilateral ovaries are within normal limits Other: No abdominopelvic ascites. Musculoskeletal: Degenerative changes of the lumbar spine, most prominent at L4-5. Stable mixed lytic/sclerotic process with coarse trabeculations involving the right proximal humerus, suggesting Paget's disease. IMPRESSION: 3.5 x 2.6 cm right middle lobe/perihilar mass, mildly  increased. Mixed response of thoracic lymphadenopathy. Dominant 3.0 cm short axis high right paratracheal node is mildly increased. No evidence of metastatic disease in the abdomen/pelvis. Additional ancillary findings as above. Electronically Signed   By: Julian Hy M.D.   On: 09/12/2015 09:38   ASSESSMENT AND PLAN: This is a very pleasant 74 years old African American female with metastatic non-small cell lung cancer, squamous cell carcinoma status post several chemotherapy regimen and and completed a course of palliative radiotherapy to the right hilum in February 2015.  She is currently on treatment with immunotherapy with Nivolumab status post 13 cycles. The patient is doing fine and tolerating her treatment fairly well. The recent CT scan of the chest, abdomen and pelvis showed mild increase in the right middle lobe and mediastinal lymphadenopathy but not enough to meet criteria for disease progression. Reommended for the patient to continue with her current treatment with Nivolumab. She will receive cycle #14 today as scheduled The patient would come back for follow-up visit in 2 weeks for cycle #15. A prescription for Vicodin and Tussionex have been refilled today She was advised to call immediately if she has any concerning symptoms in the interval.  The patient voices understanding of current disease status and treatment options and is in agreement with the current care plan.  All questions were answered. The patient knows to call the clinic with any problems, questions or concerns. We can certainly see the patient much sooner if necessary.  ADDENDUM: Hematology/Oncology Attending: I had a face to face encounter with the  patient. I recommended her care plan. This is a very pleasant 74 years old African-American female was metastatic non-small cell lung cancer, squamous cell carcinoma. The patient is currently undergoing treatment with immunotherapy with Nivolumab status post 13  cycles and she has been treating her treatment fairly well. She is feeling fine today and I recommended for the patient to proceed with cycle #14 as a scheduled. She will come back for follow-up visit in 2 weeks for reevaluation before starting cycle #15. The patient was given a refill for Vicodin and Tussionex next today. She was advised to call immediately if she has any concerning symptoms in the interval.  Disclaimer: This note was dictated with voice recognition software. Similar sounding words can inadvertently be transcribed and may be missed upon review. Destiny Harrison., MD 10/10/2015

## 2015-10-10 NOTE — Patient Instructions (Signed)
[facility] Discharge Instructions for Patients Receiving Chemotherapy  Today you received the following chemotherapy agents Nivolumab  To help prevent nausea and vomiting after your treatment, we encourage you to take your nausea medication     If you develop nausea and vomiting that is not controlled by your nausea medication, call the clinic.   BELOW ARE SYMPTOMS THAT SHOULD BE REPORTED IMMEDIATELY:  *FEVER GREATER THAN 100.5 F  *CHILLS WITH OR WITHOUT FEVER  NAUSEA AND VOMITING THAT IS NOT CONTROLLED WITH YOUR NAUSEA MEDICATION  *UNUSUAL SHORTNESS OF BREATH  *UNUSUAL BRUISING OR BLEEDING  TENDERNESS IN MOUTH AND THROAT WITH OR WITHOUT PRESENCE OF ULCERS  *URINARY PROBLEMS  *BOWEL PROBLEMS  UNUSUAL RASH Items with * indicate a potential emergency and should be followed up as soon as possible.  Feel free to call the clinic you have any questions or concerns. The clinic phone number is [phone].  Please show the CHEMO ALERT CARD at check-in to the Emergency Department and triage nurse.   

## 2015-10-24 ENCOUNTER — Ambulatory Visit (HOSPITAL_BASED_OUTPATIENT_CLINIC_OR_DEPARTMENT_OTHER): Payer: Medicare Other

## 2015-10-24 ENCOUNTER — Other Ambulatory Visit (HOSPITAL_BASED_OUTPATIENT_CLINIC_OR_DEPARTMENT_OTHER): Payer: Medicare Other

## 2015-10-24 ENCOUNTER — Ambulatory Visit (HOSPITAL_BASED_OUTPATIENT_CLINIC_OR_DEPARTMENT_OTHER): Payer: Medicare Other | Admitting: Physician Assistant

## 2015-10-24 VITALS — BP 158/81 | HR 114 | Temp 98.6°F | Resp 18 | Ht 65.0 in | Wt 136.5 lb

## 2015-10-24 DIAGNOSIS — R0609 Other forms of dyspnea: Secondary | ICD-10-CM | POA: Diagnosis not present

## 2015-10-24 DIAGNOSIS — C3411 Malignant neoplasm of upper lobe, right bronchus or lung: Secondary | ICD-10-CM | POA: Diagnosis present

## 2015-10-24 DIAGNOSIS — C3490 Malignant neoplasm of unspecified part of unspecified bronchus or lung: Secondary | ICD-10-CM

## 2015-10-24 DIAGNOSIS — Z5112 Encounter for antineoplastic immunotherapy: Secondary | ICD-10-CM | POA: Diagnosis present

## 2015-10-24 LAB — CBC WITH DIFFERENTIAL/PLATELET
BASO%: 0.6 % (ref 0.0–2.0)
Basophils Absolute: 0 10*3/uL (ref 0.0–0.1)
EOS%: 3.6 % (ref 0.0–7.0)
Eosinophils Absolute: 0.2 10*3/uL (ref 0.0–0.5)
HCT: 34.9 % (ref 34.8–46.6)
HEMOGLOBIN: 11.6 g/dL (ref 11.6–15.9)
LYMPH%: 20.5 % (ref 14.0–49.7)
MCH: 28 pg (ref 25.1–34.0)
MCHC: 33.2 g/dL (ref 31.5–36.0)
MCV: 84.4 fL (ref 79.5–101.0)
MONO#: 0.7 10*3/uL (ref 0.1–0.9)
MONO%: 11.1 % (ref 0.0–14.0)
NEUT%: 64.2 % (ref 38.4–76.8)
NEUTROS ABS: 4.1 10*3/uL (ref 1.5–6.5)
Platelets: 312 10*3/uL (ref 145–400)
RBC: 4.14 10*6/uL (ref 3.70–5.45)
RDW: 13.8 % (ref 11.2–14.5)
WBC: 6.3 10*3/uL (ref 3.9–10.3)
lymph#: 1.3 10*3/uL (ref 0.9–3.3)

## 2015-10-24 LAB — COMPREHENSIVE METABOLIC PANEL
ALBUMIN: 3.5 g/dL (ref 3.5–5.0)
ALK PHOS: 89 U/L (ref 40–150)
ALT: 11 U/L (ref 0–55)
AST: 15 U/L (ref 5–34)
Anion Gap: 9 mEq/L (ref 3–11)
BILIRUBIN TOTAL: 0.34 mg/dL (ref 0.20–1.20)
BUN: 12.8 mg/dL (ref 7.0–26.0)
CO2: 29 meq/L (ref 22–29)
Calcium: 9.6 mg/dL (ref 8.4–10.4)
Chloride: 102 mEq/L (ref 98–109)
Creatinine: 0.9 mg/dL (ref 0.6–1.1)
EGFR: 75 mL/min/{1.73_m2} — AB (ref >90)
GLUCOSE: 93 mg/dL (ref 70–140)
POTASSIUM: 4.2 meq/L (ref 3.5–5.1)
SODIUM: 140 meq/L (ref 136–145)
TOTAL PROTEIN: 7.6 g/dL (ref 6.4–8.3)

## 2015-10-24 MED ORDER — HEPARIN SOD (PORK) LOCK FLUSH 100 UNIT/ML IV SOLN
500.0000 [IU] | Freq: Once | INTRAVENOUS | Status: AC | PRN
  Administered 2015-10-24: 500 [IU]
  Filled 2015-10-24: qty 5

## 2015-10-24 MED ORDER — SODIUM CHLORIDE 0.9 % IV SOLN
Freq: Once | INTRAVENOUS | Status: AC
  Administered 2015-10-24: 11:00:00 via INTRAVENOUS

## 2015-10-24 MED ORDER — SODIUM CHLORIDE 0.9 % IV SOLN
240.0000 mg | Freq: Once | INTRAVENOUS | Status: AC
  Administered 2015-10-24: 240 mg via INTRAVENOUS
  Filled 2015-10-24: qty 20

## 2015-10-24 MED ORDER — SODIUM CHLORIDE 0.9 % IJ SOLN
10.0000 mL | INTRAMUSCULAR | Status: DC | PRN
  Administered 2015-10-24: 10 mL
  Filled 2015-10-24: qty 10

## 2015-10-24 NOTE — Patient Instructions (Signed)
[facility] Discharge Instructions for Patients Receiving Chemotherapy  Today you received the following chemotherapy agents Nivolumab  To help prevent nausea and vomiting after your treatment, we encourage you to take your nausea medication     If you develop nausea and vomiting that is not controlled by your nausea medication, call the clinic.   BELOW ARE SYMPTOMS THAT SHOULD BE REPORTED IMMEDIATELY:  *FEVER GREATER THAN 100.5 F  *CHILLS WITH OR WITHOUT FEVER  NAUSEA AND VOMITING THAT IS NOT CONTROLLED WITH YOUR NAUSEA MEDICATION  *UNUSUAL SHORTNESS OF BREATH  *UNUSUAL BRUISING OR BLEEDING  TENDERNESS IN MOUTH AND THROAT WITH OR WITHOUT PRESENCE OF ULCERS  *URINARY PROBLEMS  *BOWEL PROBLEMS  UNUSUAL RASH Items with * indicate a potential emergency and should be followed up as soon as possible.  Feel free to call the clinic you have any questions or concerns. The clinic phone number is [phone].  Please show the CHEMO ALERT CARD at check-in to the Emergency Department and triage nurse.   

## 2015-10-24 NOTE — Progress Notes (Signed)
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   PRIOR THERAPY:  1. Status post palliative radiotherapy to the left lung mass under the care of Dr. Pablo Ledger completed on 03/06/2012.  2. Systemic chemotherapy with carboplatin for AUC of 6 on day 1 and Abraxane 100 mg/M2 on days 1, 8 and 15 every 3 weeks. Status post 2 cycles. From cycle 3 forward AUC will be decreased to 4.5 given on day 1 and the Abraxane will be decreased to 90 mg per meter squared on days 1, 8 and 15 every 3 weeks, Status post a total of 3 cycles.  3. Systemic chemotherapy with carboplatin for AUC of 5 on day 1 and gemcitabine 1000 mg/m2 given on day 1 and day 8 every 3 weeks,status post 1 cycle. Due to significant neutropenia she will be dosed reduced beginning cycle 2 forward to carboplatin at an AUC of 4 given on day 1 and gemcitabine at 800 mg per meter squared given on days 1 and 8 every 3 weeks. Status post 6 cycles.  4. Status post palliative radiotherapy to the right hilum under the care of Dr. Pablo Ledger completed on 11/26/2013.  CURRENT THERAPY: Immunotherapy with Nivolumab 3 MG/KG every 2 weeks. First dose 03/07/2015. Status post 14 cycles.  CHEMOTHERAPY INTENT: Palliative  CURRENT # OF CHEMOTHERAPY CYCLES: 14  CURRENT ANTIEMETICS: Compazine  CURRENT SMOKING STATUS: Former smoker  ORAL CHEMOTHERAPY AND CONSENT: None  CURRENT BISPHOSPHONATES USE: None  PAIN MANAGEMENT: 5/10 right shoulder currently on Norco  NARCOTICS INDUCED CONSTIPATION: None  LIVING WILL AND CODE STATUS: No CODE BLUE   INTERVAL HISTORY: Destiny Harrison 75 y.o. female returns to the clinic today for followup visit accompanied by her son. The patient is currently on immunotherapy with Nivolumab every 2 weeks  and tolerating it fairly well. The patient has no complaints today. She denied having any significant weight loss or night sweats. She has no chest pain, but has shortness of breath increased with exertion and cough with no hemoptysis. The patient denied having any significant fever or chills, nausea or vomiting. Denies any bleeding issues. She is here for cycle #15 of her immunotherapy today.  MEDICAL HISTORY: Past Medical History  Diagnosis Date  . COPD (chronic obstructive pulmonary disease) ([facility])   . Neutropenia, drug-induced ([facility]) 05/05/2012  . Hyperlipidemia   . Rheumatoid arthritis(714.0)   . Asthma   . Hiatal hernia   . History of chemotherapy   . History of radiation therapy 03/06/2012    left hilum  . History of radiation therapy 05/10/2013-05/31/2013    Left lung/ 33/75'@2'$ .25 per fraction x 15 fractions  . Radiation 11/15/13-11/26/13    Right hilum 30 Gy in 10 fractions  . Non-small cell lung cancer ([facility]) dx'd 08/28/11    left lung  . Encounter for antineoplastic immunotherapy 05/22/2015  . DNR (do not resuscitate) 06/26/2015  . Primary cancer of right upper lobe of lung ([name]) 02/06/2012    ALLERGIES:  is allergic to shellfish allergy.  MEDICATIONS:  Current Outpatient Prescriptions  Medication Sig Dispense Refill  . albuterol (PROAIR HFA) 108 (90 BASE) MCG/ACT inhaler Inhale 2 puffs into the lungs every 6 (six) hours as needed for wheezing or shortness of breath. 1 Inhaler 5  . albuterol (PROVENTIL) (2.5 MG/3ML) 0.083% nebulizer solution Take 2.5  mg by nebulization every 8 (eight) hours.    . budesonide (PULMICORT) 0.25 MG/2ML nebulizer solution Take 2 mLs (0.25 mg total) by nebulization 2 (two) times daily. 60 mL 12  . calcium-vitamin D (OSCAL WITH D) 500-200 MG-UNIT per tablet Take 1 tablet by mouth daily.    . chlorpheniramine-HYDROcodone (TUSSIONEX) 10-8 MG/5ML SUER Take 5 mLs by mouth every 8 (eight) hours as needed for cough. 454 mL 0  . folic acid (FOLVITE) 098 MCG tablet Take  400 mcg by mouth daily.    Marland Kitchen guaiFENesin (MUCINEX) 600 MG 12 hr tablet Take 1 tablet (600 mg total) by mouth 2 (two) times daily. 30 tablet 1  . HYDROcodone-acetaminophen (NORCO/VICODIN) 5-325 MG tablet Take 1 tablet by mouth every 4 (four) hours as needed for moderate pain. 30 tablet 0  . ipratropium (ATROVENT) 0.02 % nebulizer solution Take 0.5 mg by nebulization every 8 (eight) hours.    . ondansetron (ZOFRAN ODT) 8 MG disintegrating tablet Take 1 tablet (8 mg total) by mouth every 8 (eight) hours as needed for nausea or vomiting. 20 tablet 0  . OXYGEN Inhale 2 L into the lungs continuous.    . predniSONE (DELTASONE) 10 MG tablet Take 5 mg by mouth daily.  0  . prochlorperazine (COMPAZINE) 10 MG tablet Take 1 tablet (10 mg total) by mouth every 6 (six) hours as needed for nausea or vomiting. (Patient not taking: Reported on 09/19/2015) 30 tablet 0  . simvastatin (ZOCOR) 5 MG tablet Take 10 mg by mouth at bedtime.      No current facility-administered medications for this visit.    SURGICAL HISTORY:  Past Surgical History  Procedure Laterality Date  . Appendex  1962  . Video bronchoscopy  01/28/2012    Procedure: VIDEO BRONCHOSCOPY WITHOUT FLUORO;  Surgeon: Brand Males, MD;  Location: [facility] ENDOSCOPY;  Service: Endoscopy;  Laterality: Bilateral;  . Surgery on right wrist      REVIEW OF SYSTEMS:  Constitutional: negative Eyes: negative Ears, nose, mouth, throat, and face: negative Respiratory: positive for cough and dyspnea on exertion Cardiovascular: negative Gastrointestinal: negative Genitourinary:negative Integument/breast: negative Hematologic/lymphatic: negative Musculoskeletal:negative Neurological: negative Behavioral/Psych: negative Endocrine: negative Allergic/Immunologic: negative   PHYSICAL EXAMINATION: General appearance: alert, cooperative and no distress Head: Normocephalic, without obvious abnormality, atraumatic Neck: no adenopathy, no JVD, supple,  symmetrical, trachea midline and thyroid not enlarged, symmetric, no tenderness/mass/nodules Lymph nodes: Cervical, supraclavicular, and axillary nodes normal. Resp: clear to auscultation bilaterally Back: symmetric, no curvature. ROM normal. No CVA tenderness. Cardio: regular rate and rhythm, S1, S2 normal, no murmur, click, rub or gallop GI: soft, non-tender; bowel sounds normal; no masses,  no organomegaly Extremities: extremities normal, atraumatic, no cyanosis or edema Neurologic: Alert and oriented X 3, normal strength and tone. Normal symmetric reflexes. Normal coordination and gait  ECOG PERFORMANCE STATUS: 1 - Symptomatic but completely ambulatory  Blood pressure 158/81, pulse 114, temperature 98.6 F (37 C), temperature source Oral, resp. rate 18, height '5\' 5"'$  (1.651 m), weight 136 lb 8 oz (61.916 kg), SpO2 97 %.  LABORATORY DATA: CBC Latest Ref Rng 10/24/2015 10/10/2015 09/26/2015  WBC 3.9 - 10.3 10e3/uL 6.3 5.8 6.5  Hemoglobin 11.6 - 15.9 g/dL 11.6 11.2(L) 11.0(L)  Hematocrit 34.8 - 46.6 % 34.9 34.2(L) 32.9(L)  Platelets 145 - 400 10e3/uL 312 250 390   CMP Latest Ref Rng 10/24/2015 10/10/2015 09/26/2015  Glucose 70 - 140 mg/dl 93 107 138  BUN 7.0 - 26.0 mg/dL 12.8 10.9 10.9  Creatinine 0.6 - 1.1  mg/dL 0.9 1.0 0.9  Sodium 136 - 145 mEq/L 140 138 139  Potassium 3.5 - 5.1 mEq/L 4.2 3.9 4.1  Chloride 101 - 111 mmol/L - - -  CO2 22 - 29 mEq/L '29 29 28  '$ Calcium 8.4 - 10.4 mg/dL 9.6 9.5 9.5  Total Protein 6.4 - 8.3 g/dL 7.6 7.5 7.3  Total Bilirubin 0.20 - 1.20 mg/dL 0.34 0.56 0.41  Alkaline Phos 40 - 150 U/L 89 67 76  AST 5 - 34 U/L '15 15 13  '$ ALT 0 - 55 U/L 11 10 <9    RADIOGRAPHIC STUDIES: No results found.   ASSESSMENT AND PLAN: This is a very pleasant 75 years old African American female with metastatic non-small cell lung cancer, squamous cell carcinoma status post several chemotherapy regimen and and completed a course of palliative radiotherapy to the right hilum in  February 2015.  She is currently on treatment with immunotherapy with Nivolumab status post 14 cycles. The patient is doing fine and tolerating her treatment fairly well. The recent CT scan of the chest, abdomen and pelvis showed mild increase in the right middle lobe and mediastinal lymphadenopathy but not enough to meet criteria for disease progression. Reommended for the patient to continue with her current treatment with Nivolumab. She will receive cycle #15 today as scheduled The patient would come back for follow-up visit in 2 weeks for cycle #16. After that will proceed with restaging CTs to monitor response to therapy  She was advised to call immediately if she has any concerning symptoms in the interval.  The patient voices understanding of current disease status and treatment options and is in agreement with the current care plan.  All questions were answered. The patient knows to call the clinic with any problems, questions or concerns. We can certainly see the patient much sooner if necessary.    ADDENDUM: Hematology/Oncology Attending: I had a face to face encounter with the patient today. I recommended her care plan. This is a very pleasant 75 years old African-American female with metastatic non-small cell lung cancer, squamous cell carcinoma who is currently undergoing treatment with immunotherapy with Nivolumab status post 14 cycles. The patient has been treating her treatment fairly well with no significant adverse effects. I recommended for her to proceed with cycle #15 today as a scheduled. The patient would come back for follow-up visit in 2 weeks for reevaluation before starting cycle #16. She was advised to call immediately if she has any concerning symptoms in the interval.  Disclaimer: This note was dictated with voice recognition software. Similar sounding words can inadvertently be transcribed and may be missed upon review.  Eilleen Kempf., MD 10/24/2015

## 2015-11-07 ENCOUNTER — Other Ambulatory Visit (HOSPITAL_BASED_OUTPATIENT_CLINIC_OR_DEPARTMENT_OTHER): Payer: Medicare Other

## 2015-11-07 ENCOUNTER — Ambulatory Visit (HOSPITAL_BASED_OUTPATIENT_CLINIC_OR_DEPARTMENT_OTHER): Payer: Medicare Other

## 2015-11-07 ENCOUNTER — Telehealth: Payer: Self-pay | Admitting: Internal Medicine

## 2015-11-07 ENCOUNTER — Encounter: Payer: Self-pay | Admitting: Internal Medicine

## 2015-11-07 ENCOUNTER — Ambulatory Visit (HOSPITAL_BASED_OUTPATIENT_CLINIC_OR_DEPARTMENT_OTHER): Payer: Medicare Other | Admitting: Internal Medicine

## 2015-11-07 VITALS — BP 138/67 | HR 122 | Temp 98.3°F | Resp 17 | Ht 65.0 in | Wt 139.0 lb

## 2015-11-07 DIAGNOSIS — R05 Cough: Secondary | ICD-10-CM

## 2015-11-07 DIAGNOSIS — C3411 Malignant neoplasm of upper lobe, right bronchus or lung: Secondary | ICD-10-CM | POA: Diagnosis not present

## 2015-11-07 DIAGNOSIS — C3490 Malignant neoplasm of unspecified part of unspecified bronchus or lung: Secondary | ICD-10-CM

## 2015-11-07 DIAGNOSIS — Z5112 Encounter for antineoplastic immunotherapy: Secondary | ICD-10-CM | POA: Diagnosis present

## 2015-11-07 DIAGNOSIS — R0609 Other forms of dyspnea: Secondary | ICD-10-CM | POA: Diagnosis not present

## 2015-11-07 LAB — COMPREHENSIVE METABOLIC PANEL
ALT: 10 U/L (ref 0–55)
ANION GAP: 9 meq/L (ref 3–11)
AST: 14 U/L (ref 5–34)
Albumin: 3.6 g/dL (ref 3.5–5.0)
Alkaline Phosphatase: 70 U/L (ref 40–150)
BUN: 10 mg/dL (ref 7.0–26.0)
CHLORIDE: 102 meq/L (ref 98–109)
CO2: 28 meq/L (ref 22–29)
CREATININE: 1 mg/dL (ref 0.6–1.1)
Calcium: 9.7 mg/dL (ref 8.4–10.4)
EGFR: 66 mL/min/{1.73_m2} — ABNORMAL LOW (ref >90)
Glucose: 95 mg/dl (ref 70–140)
POTASSIUM: 4.6 meq/L (ref 3.5–5.1)
Sodium: 139 mEq/L (ref 136–145)
Total Bilirubin: 0.44 mg/dL (ref 0.20–1.20)
Total Protein: 7.5 g/dL (ref 6.4–8.3)

## 2015-11-07 LAB — CBC WITH DIFFERENTIAL/PLATELET
BASO%: 0.3 % (ref 0.0–2.0)
Basophils Absolute: 0 10*3/uL (ref 0.0–0.1)
EOS%: 3.6 % (ref 0.0–7.0)
Eosinophils Absolute: 0.2 10*3/uL (ref 0.0–0.5)
HCT: 35.7 % (ref 34.8–46.6)
HGB: 11.6 g/dL (ref 11.6–15.9)
LYMPH%: 21.1 % (ref 14.0–49.7)
MCH: 27.3 pg (ref 25.1–34.0)
MCHC: 32.6 g/dL (ref 31.5–36.0)
MCV: 83.8 fL (ref 79.5–101.0)
MONO#: 0.7 10*3/uL (ref 0.1–0.9)
MONO%: 11.2 % (ref 0.0–14.0)
NEUT#: 4.1 10*3/uL (ref 1.5–6.5)
NEUT%: 63.8 % (ref 38.4–76.8)
PLATELETS: 271 10*3/uL (ref 145–400)
RBC: 4.26 10*6/uL (ref 3.70–5.45)
RDW: 14 % (ref 11.2–14.5)
WBC: 6.5 10*3/uL (ref 3.9–10.3)
lymph#: 1.4 10*3/uL (ref 0.9–3.3)

## 2015-11-07 MED ORDER — SODIUM CHLORIDE 0.9 % IJ SOLN
10.0000 mL | INTRAMUSCULAR | Status: DC | PRN
  Administered 2015-11-07: 10 mL
  Filled 2015-11-07: qty 10

## 2015-11-07 MED ORDER — SODIUM CHLORIDE 0.9 % IV SOLN
240.0000 mg | Freq: Once | INTRAVENOUS | Status: AC
  Administered 2015-11-07: 240 mg via INTRAVENOUS
  Filled 2015-11-07: qty 20

## 2015-11-07 MED ORDER — HEPARIN SOD (PORK) LOCK FLUSH 100 UNIT/ML IV SOLN
500.0000 [IU] | Freq: Once | INTRAVENOUS | Status: AC | PRN
  Administered 2015-11-07: 500 [IU]
  Filled 2015-11-07: qty 5

## 2015-11-07 MED ORDER — HYDROCOD POLST-CPM POLST ER 10-8 MG/5ML PO SUER: 5.0000 mL | Freq: Three times a day (TID) | ORAL | Status: DC | PRN

## 2015-11-07 MED ORDER — SODIUM CHLORIDE 0.9 % IV SOLN
Freq: Once | INTRAVENOUS | Status: AC
  Administered 2015-11-07: 11:00:00 via INTRAVENOUS

## 2015-11-07 MED ORDER — HYDROCODONE-ACETAMINOPHEN 5-325 MG PO TABS: 1.0000 | ORAL_TABLET | ORAL | Status: DC | PRN

## 2015-11-07 NOTE — Progress Notes (Signed)
[facility] [phone]) [phone]   Fax:(336) [place], [place], [address] [place] [address]  DIAGNOSIS: Metastatic non-small cell lung cancer, squamous cell carcinoma diagnosed in March of 2013.   PRIOR THERAPY:  1. Status post palliative radiotherapy to the left lung mass under the care of Dr. Pablo Ledger completed on 03/06/2012.  2. Systemic chemotherapy with carboplatin for AUC of 6 on day 1 and Abraxane 100 mg/M2 on days 1, 8 and 15 every 3 weeks. Status post 2 cycles. From cycle 3 forward AUC will be decreased to 4.5 given on day 1 and the Abraxane will be decreased to 90 mg per meter squared on days 1, 8 and 15 every 3 weeks, Status post a total of 3 cycles.  3. Systemic chemotherapy with carboplatin for AUC of 5 on day 1 and gemcitabine 1000 mg/m2 given on day 1 and day 8 every 3 weeks,status post 1 cycle. Due to significant neutropenia she will be dosed reduced beginning cycle 2 forward to carboplatin at an AUC of 4 given on day 1 and gemcitabine at 800 mg per meter squared given on days 1 and 8 every 3 weeks. Status post 6 cycles.  4. Status post palliative radiotherapy to the right hilum under the care of Dr. Pablo Ledger completed on 11/26/2013.  CURRENT THERAPY: Immunotherapy with Nivolumab 3 MG/KG every 2 weeks. First dose expected 03/07/2015. Status post 15 cycles.  CHEMOTHERAPY INTENT: Palliative  CURRENT # OF CHEMOTHERAPY CYCLES: 16  CURRENT ANTIEMETICS: Compazine  CURRENT SMOKING STATUS: Former smoker  ORAL CHEMOTHERAPY AND CONSENT: None  CURRENT BISPHOSPHONATES USE: None  PAIN MANAGEMENT: 5/10 right shoulder currently on Norco  NARCOTICS INDUCED CONSTIPATION: None  LIVING WILL AND CODE STATUS: No CODE BLUE   INTERVAL HISTORY: Destiny Harrison 75 y.o. female returns to the clinic today for followup visit accompanied by her son. The patient is currently on immunotherapy with Nivolumab every 2  weeks and tolerating it fairly well. The patient has no complaints today except for the persistent dry cough with occasional whitish sputum. She denied having any significant weight loss or night sweats. She has no chest pain, but has shortness of breath increased with exertion and cough with no hemoptysis. The patient denied having any significant fever or chills, nausea or vomiting. She is here today to start cycle #16 of her treatment.   MEDICAL HISTORY: Past Medical History  Diagnosis Date  . COPD (chronic obstructive pulmonary disease) ([facility])   . Neutropenia, drug-induced ([facility]) 05/05/2012  . Hyperlipidemia   . Rheumatoid arthritis(714.0)   . Asthma   . Hiatal hernia   . History of chemotherapy   . History of radiation therapy 03/06/2012    left hilum  . History of radiation therapy 05/10/2013-05/31/2013    Left lung/ 33/75'@2'$ .25 per fraction x 15 fractions  . Radiation 11/15/13-11/26/13    Right hilum 30 Gy in 10 fractions  . Non-small cell lung cancer ([place]) dx'd 08/28/11    left lung  . Encounter for antineoplastic immunotherapy 05/22/2015  . DNR (do not resuscitate) 06/26/2015  . Primary cancer of right upper lobe of lung ([facility]) 02/06/2012    ALLERGIES:  is allergic to shellfish allergy.  MEDICATIONS:  Current Outpatient Prescriptions  Medication Sig Dispense Refill  . albuterol (PROAIR HFA) 108 (90 BASE) MCG/ACT inhaler Inhale 2 puffs into the lungs every 6 (six) hours as needed for wheezing or shortness of breath. 1 Inhaler 5  .  albuterol (PROVENTIL) (2.5 MG/3ML) 0.083% nebulizer solution Take 2.5 mg by nebulization every 8 (eight) hours.    . budesonide (PULMICORT) 0.25 MG/2ML nebulizer solution Take 2 mLs (0.25 mg total) by nebulization 2 (two) times daily. 60 mL 12  . calcium-vitamin D (OSCAL WITH D) 500-200 MG-UNIT per tablet Take 1 tablet by mouth daily.    . chlorpheniramine-HYDROcodone (TUSSIONEX) 10-8 MG/5ML SUER Take 5 mLs by mouth every 8 (eight) hours as needed for cough. 970  mL 0  . folic acid (FOLVITE) 263 MCG tablet Take 400 mcg by mouth daily.    Marland Kitchen guaiFENesin (MUCINEX) 600 MG 12 hr tablet Take 1 tablet (600 mg total) by mouth 2 (two) times daily. 30 tablet 1  . HYDROcodone-acetaminophen (NORCO/VICODIN) 5-325 MG tablet Take 1 tablet by mouth every 4 (four) hours as needed for moderate pain. 30 tablet 0  . ipratropium (ATROVENT) 0.02 % nebulizer solution Take 0.5 mg by nebulization every 8 (eight) hours.    . OXYGEN Inhale 2 L into the lungs continuous.    . predniSONE (DELTASONE) 5 MG tablet Take 5 mg by mouth daily.    . simvastatin (ZOCOR) 5 MG tablet Take 10 mg by mouth at bedtime.     . ondansetron (ZOFRAN ODT) 8 MG disintegrating tablet Take 1 tablet (8 mg total) by mouth every 8 (eight) hours as needed for nausea or vomiting. (Patient not taking: Reported on 11/07/2015) 20 tablet 0  . prochlorperazine (COMPAZINE) 10 MG tablet Take 1 tablet (10 mg total) by mouth every 6 (six) hours as needed for nausea or vomiting. (Patient not taking: Reported on 09/19/2015) 30 tablet 0   No current facility-administered medications for this visit.    SURGICAL HISTORY:  Past Surgical History  Procedure Laterality Date  . Appendex  1962  . Video bronchoscopy  01/28/2012    Procedure: VIDEO BRONCHOSCOPY WITHOUT FLUORO;  Surgeon: Brand Males, MD;  Location: [facility] ENDOSCOPY;  Service: Endoscopy;  Laterality: Bilateral;  . Surgery on right wrist      REVIEW OF SYSTEMS:  A comprehensive review of systems was negative except for: Respiratory: positive for cough and dyspnea on exertion   PHYSICAL EXAMINATION: General appearance: alert, cooperative and no distress Head: Normocephalic, without obvious abnormality, atraumatic Neck: no adenopathy, no JVD, supple, symmetrical, trachea midline and thyroid not enlarged, symmetric, no tenderness/mass/nodules Lymph nodes: Cervical, supraclavicular, and axillary nodes normal. Resp: clear to auscultation bilaterally Back: symmetric,  no curvature. ROM normal. No CVA tenderness. Cardio: regular rate and rhythm, S1, S2 normal, no murmur, click, rub or gallop GI: soft, non-tender; bowel sounds normal; no masses,  no organomegaly Extremities: extremities normal, atraumatic, no cyanosis or edema Neurologic: Alert and oriented X 3, normal strength and tone. Normal symmetric reflexes. Normal coordination and gait  ECOG PERFORMANCE STATUS: 1 - Symptomatic but completely ambulatory  Blood pressure 138/67, pulse 122, temperature 98.3 F (36.8 C), temperature source Oral, resp. rate 17, height '5\' 5"'$  (1.651 m), weight 139 lb (63.05 kg), SpO2 95 %.  LABORATORY DATA: Lab Results  Component Value Date   WBC 6.5 11/07/2015   HGB 11.6 11/07/2015   HCT 35.7 11/07/2015   MCV 83.8 11/07/2015   PLT 271 11/07/2015      Chemistry      Component Value Date/Time   NA 139 11/07/2015 0852   NA 139 08/19/2015 0440   K 4.6 11/07/2015 0852   K 4.1 08/19/2015 0440   CL 103 08/19/2015 0440   CL 101 01/12/2013 0810  CO2 28 11/07/2015 0852   CO2 31 08/19/2015 0440   BUN 10.0 11/07/2015 0852   BUN 16 08/19/2015 0440   CREATININE 1.0 11/07/2015 0852   CREATININE 0.82 08/19/2015 0440      Component Value Date/Time   CALCIUM 9.7 11/07/2015 0852   CALCIUM 8.9 08/19/2015 0440   ALKPHOS 70 11/07/2015 0852   ALKPHOS 72 08/18/2015 0100   AST 14 11/07/2015 0852   AST 22 08/18/2015 0100   ALT 10 11/07/2015 0852   ALT 14 08/18/2015 0100   BILITOT 0.44 11/07/2015 0852   BILITOT 0.9 08/18/2015 0100       RADIOGRAPHIC STUDIES: No results found. ASSESSMENT AND PLAN: This is a very pleasant 75 years old African American female with metastatic non-small cell lung cancer, squamous cell carcinoma status post several chemotherapy regimen and and completed a course of palliative radiotherapy to the right hilum in February 2015.  She is currently on treatment with immunotherapy with Nivolumab status post 15 cycles. The patient is doing fine and  tolerating her treatment fairly well.  I recommended for the patient to proceed with cycle #16 today as a scheduled. The patient would come back for follow-up visit in 3 weeks for reevaluation before starting cycle #17 after repeating CT scan of the chest for restaging of her disease. She was giving a refill for Vicodin and Tussinox She was advised to call immediately if she has any concerning symptoms in the interval.  The patient voices understanding of current disease status and treatment options and is in agreement with the current care plan.  All questions were answered. The patient knows to call the clinic with any problems, questions or concerns. We can certainly see the patient much sooner if necessary.  Disclaimer: This note was dictated with voice recognition software. Similar sounding words can inadvertently be transcribed and may not be corrected upon review.

## 2015-11-07 NOTE — Patient Instructions (Signed)
[facility] Discharge Instructions for Patients Receiving Chemotherapy  Today you received the following chemotherapy agents Nivolumab  To help prevent nausea and vomiting after your treatment, we encourage you to take your nausea medication     If you develop nausea and vomiting that is not controlled by your nausea medication, call the clinic.   BELOW ARE SYMPTOMS THAT SHOULD BE REPORTED IMMEDIATELY:  *FEVER GREATER THAN 100.5 F  *CHILLS WITH OR WITHOUT FEVER  NAUSEA AND VOMITING THAT IS NOT CONTROLLED WITH YOUR NAUSEA MEDICATION  *UNUSUAL SHORTNESS OF BREATH  *UNUSUAL BRUISING OR BLEEDING  TENDERNESS IN MOUTH AND THROAT WITH OR WITHOUT PRESENCE OF ULCERS  *URINARY PROBLEMS  *BOWEL PROBLEMS  UNUSUAL RASH Items with * indicate a potential emergency and should be followed up as soon as possible.  Feel free to call the clinic you have any questions or concerns. The clinic phone number is [phone].  Please show the CHEMO ALERT CARD at check-in to the Emergency Department and triage nurse.   

## 2015-11-07 NOTE — Telephone Encounter (Signed)
Talked to patient here in office. Scheduled appt.       [facility]. °

## 2015-11-20 ENCOUNTER — Encounter (HOSPITAL_COMMUNITY): Payer: Self-pay

## 2015-11-20 ENCOUNTER — Ambulatory Visit (HOSPITAL_COMMUNITY)
Admission: RE | Admit: 2015-11-20 | Discharge: 2015-11-20 | Disposition: A | Discharge status: Home / Self Care | Payer: Medicare Other | Source: Ambulatory Visit | Attending: Internal Medicine | Admitting: Internal Medicine

## 2015-11-20 DIAGNOSIS — C3492 Malignant neoplasm of unspecified part of left bronchus or lung: Secondary | ICD-10-CM | POA: Diagnosis not present

## 2015-11-20 DIAGNOSIS — Z9221 Personal history of antineoplastic chemotherapy: Secondary | ICD-10-CM | POA: Insufficient documentation

## 2015-11-20 DIAGNOSIS — J432 Centrilobular emphysema: Secondary | ICD-10-CM | POA: Diagnosis not present

## 2015-11-20 DIAGNOSIS — R222 Localized swelling, mass and lump, trunk: Secondary | ICD-10-CM | POA: Diagnosis not present

## 2015-11-20 DIAGNOSIS — M4854XA Collapsed vertebra, not elsewhere classified, thoracic region, initial encounter for fracture: Secondary | ICD-10-CM | POA: Diagnosis not present

## 2015-11-20 DIAGNOSIS — Z923 Personal history of irradiation: Secondary | ICD-10-CM | POA: Diagnosis not present

## 2015-11-20 DIAGNOSIS — C3490 Malignant neoplasm of unspecified part of unspecified bronchus or lung: Secondary | ICD-10-CM | POA: Diagnosis not present

## 2015-11-20 DIAGNOSIS — I2510 Atherosclerotic heart disease of native coronary artery without angina pectoris: Secondary | ICD-10-CM | POA: Insufficient documentation

## 2015-11-20 MED ORDER — IOHEXOL 300 MG/ML  SOLN
75.0000 mL | Freq: Once | INTRAMUSCULAR | Status: AC | PRN
  Administered 2015-11-20: 75 mL via INTRAVENOUS

## 2015-11-21 ENCOUNTER — Other Ambulatory Visit (HOSPITAL_BASED_OUTPATIENT_CLINIC_OR_DEPARTMENT_OTHER): Payer: Medicare Other

## 2015-11-21 ENCOUNTER — Ambulatory Visit (HOSPITAL_BASED_OUTPATIENT_CLINIC_OR_DEPARTMENT_OTHER): Payer: Medicare Other

## 2015-11-21 ENCOUNTER — Encounter: Payer: Self-pay | Admitting: Internal Medicine

## 2015-11-21 ENCOUNTER — Ambulatory Visit (HOSPITAL_BASED_OUTPATIENT_CLINIC_OR_DEPARTMENT_OTHER): Payer: Medicare Other | Admitting: Internal Medicine

## 2015-11-21 VITALS — BP 115/69 | HR 120 | Temp 98.5°F | Resp 19 | Ht 65.0 in | Wt 133.2 lb

## 2015-11-21 DIAGNOSIS — C3411 Malignant neoplasm of upper lobe, right bronchus or lung: Secondary | ICD-10-CM

## 2015-11-21 DIAGNOSIS — R053 Chronic cough: Secondary | ICD-10-CM

## 2015-11-21 DIAGNOSIS — R0609 Other forms of dyspnea: Secondary | ICD-10-CM

## 2015-11-21 DIAGNOSIS — R05 Cough: Secondary | ICD-10-CM

## 2015-11-21 DIAGNOSIS — Z5112 Encounter for antineoplastic immunotherapy: Secondary | ICD-10-CM | POA: Diagnosis present

## 2015-11-21 DIAGNOSIS — C3490 Malignant neoplasm of unspecified part of unspecified bronchus or lung: Secondary | ICD-10-CM | POA: Diagnosis not present

## 2015-11-21 LAB — CBC WITH DIFFERENTIAL/PLATELET
BASO%: 0.5 % (ref 0.0–2.0)
BASOS ABS: 0 10*3/uL (ref 0.0–0.1)
EOS ABS: 0.2 10*3/uL (ref 0.0–0.5)
EOS%: 2.9 % (ref 0.0–7.0)
HEMATOCRIT: 36.9 % (ref 34.8–46.6)
HEMOGLOBIN: 12 g/dL (ref 11.6–15.9)
LYMPH#: 1.6 10*3/uL (ref 0.9–3.3)
LYMPH%: 25 % (ref 14.0–49.7)
MCH: 26.8 pg (ref 25.1–34.0)
MCHC: 32.6 g/dL (ref 31.5–36.0)
MCV: 82.1 fL (ref 79.5–101.0)
MONO#: 0.8 10*3/uL (ref 0.1–0.9)
MONO%: 11.9 % (ref 0.0–14.0)
NEUT%: 59.7 % (ref 38.4–76.8)
NEUTROS ABS: 3.8 10*3/uL (ref 1.5–6.5)
Platelets: 297 10*3/uL (ref 145–400)
RBC: 4.49 10*6/uL (ref 3.70–5.45)
RDW: 13.8 % (ref 11.2–14.5)
WBC: 6.4 10*3/uL (ref 3.9–10.3)

## 2015-11-21 LAB — COMPREHENSIVE METABOLIC PANEL
ALBUMIN: 3.8 g/dL (ref 3.5–5.0)
ALK PHOS: 69 U/L (ref 40–150)
ALT: 12 U/L (ref 0–55)
AST: 14 U/L (ref 5–34)
Anion Gap: 12 mEq/L — ABNORMAL HIGH (ref 3–11)
BILIRUBIN TOTAL: 0.57 mg/dL (ref 0.20–1.20)
BUN: 10.8 mg/dL (ref 7.0–26.0)
CALCIUM: 10 mg/dL (ref 8.4–10.4)
CO2: 27 mEq/L (ref 22–29)
CREATININE: 1 mg/dL (ref 0.6–1.1)
Chloride: 101 mEq/L (ref 98–109)
EGFR: 66 mL/min/{1.73_m2} — ABNORMAL LOW (ref >90)
GLUCOSE: 94 mg/dL (ref 70–140)
POTASSIUM: 4.1 meq/L (ref 3.5–5.1)
Sodium: 139 mEq/L (ref 136–145)
TOTAL PROTEIN: 7.9 g/dL (ref 6.4–8.3)

## 2015-11-21 MED ORDER — SODIUM CHLORIDE 0.9 % IV SOLN
Freq: Once | INTRAVENOUS | Status: AC
  Administered 2015-11-21: 11:00:00 via INTRAVENOUS

## 2015-11-21 MED ORDER — SODIUM CHLORIDE 0.9 % IV SOLN
240.0000 mg | Freq: Once | INTRAVENOUS | Status: AC
  Administered 2015-11-21: 240 mg via INTRAVENOUS
  Filled 2015-11-21: qty 20

## 2015-11-21 MED ORDER — HEPARIN SOD (PORK) LOCK FLUSH 100 UNIT/ML IV SOLN
500.0000 [IU] | Freq: Once | INTRAVENOUS | Status: AC | PRN
  Administered 2015-11-21: 500 [IU]
  Filled 2015-11-21: qty 5

## 2015-11-21 MED ORDER — SODIUM CHLORIDE 0.9 % IJ SOLN
10.0000 mL | INTRAMUSCULAR | Status: DC | PRN
  Administered 2015-11-21: 10 mL
  Filled 2015-11-21: qty 10

## 2015-11-21 NOTE — Patient Instructions (Signed)
[facility] Discharge Instructions for Patients Receiving Chemotherapy  Today you received the following chemotherapy agents:  Nivolumab.  To help prevent nausea and vomiting after your treatment, we encourage you to take your nausea medication as directed.   If you develop nausea and vomiting that is not controlled by your nausea medication, call the clinic.   BELOW ARE SYMPTOMS THAT SHOULD BE REPORTED IMMEDIATELY:  *FEVER GREATER THAN 100.5 F  *CHILLS WITH OR WITHOUT FEVER  NAUSEA AND VOMITING THAT IS NOT CONTROLLED WITH YOUR NAUSEA MEDICATION  *UNUSUAL SHORTNESS OF BREATH  *UNUSUAL BRUISING OR BLEEDING  TENDERNESS IN MOUTH AND THROAT WITH OR WITHOUT PRESENCE OF ULCERS  *URINARY PROBLEMS  *BOWEL PROBLEMS  UNUSUAL RASH Items with * indicate a potential emergency and should be followed up as soon as possible.  Feel free to call the clinic you have any questions or concerns. The clinic phone number is [phone].  Please show the CHEMO ALERT CARD at check-in to the Emergency Department and triage nurse.   

## 2015-11-21 NOTE — Progress Notes (Signed)
[facility] [phone]   Fax:(336) [place], [place], [address] [place] [address]  DIAGNOSIS: Metastatic non-small cell lung cancer, squamous cell carcinoma diagnosed in March of 2013.   PRIOR THERAPY:  1. Status post palliative radiotherapy to the left lung mass under the care of Dr. Pablo Ledger completed on 03/06/2012.  2. Systemic chemotherapy with carboplatin for AUC of 6 on day 1 and Abraxane 100 mg/M2 on days 1, 8 and 15 every 3 weeks. Status post 2 cycles. From cycle 3 forward AUC will be decreased to 4.5 given on day 1 and the Abraxane will be decreased to 90 mg per meter squared on days 1, 8 and 15 every 3 weeks, Status post a total of 3 cycles.  3. Systemic chemotherapy with carboplatin for AUC of 5 on day 1 and gemcitabine 1000 mg/m2 given on day 1 and day 8 every 3 weeks,status post 1 cycle. Due to significant neutropenia she will be dosed reduced beginning cycle 2 forward to carboplatin at an AUC of 4 given on day 1 and gemcitabine at 800 mg per meter squared given on days 1 and 8 every 3 weeks. Status post 6 cycles.  4. Status post palliative radiotherapy to the right hilum under the care of Dr. Pablo Ledger completed on 11/26/2013.  CURRENT THERAPY: Immunotherapy with Nivolumab 240 MG every 2 weeks. First dose expected 03/07/2015. Status post 16 cycles.  CHEMOTHERAPY INTENT: Palliative  CURRENT # OF CHEMOTHERAPY CYCLES: 17  CURRENT ANTIEMETICS: Compazine  CURRENT SMOKING STATUS: Former smoker  ORAL CHEMOTHERAPY AND CONSENT: None  CURRENT BISPHOSPHONATES USE: None  PAIN MANAGEMENT: 5/10 right shoulder currently on Norco  NARCOTICS INDUCED CONSTIPATION: None  LIVING WILL AND CODE STATUS: No CODE BLUE   INTERVAL HISTORY: Destiny Harrison 75 y.o. female returns to the clinic today for followup visit accompanied by her son. The patient is currently on immunotherapy with Nivolumab every 2  weeks and tolerating it fairly well. The patient has no complaints today except for the persistent dry cough. She denied having any significant weight loss or night sweats. She has no chest pain, but has shortness of breath increased with exertion and cough with no hemoptysis. The patient denied having any significant fever or chills, nausea or vomiting. She had repeat CT scan of the chest performed yesterday and she is here today for evaluation and discussion of her scan results before starting cycle #17 of her immunotherapy.    MEDICAL HISTORY: Past Medical History  Diagnosis Date  . COPD (chronic obstructive pulmonary disease) ([place])   . Neutropenia, drug-induced ([place]) 05/05/2012  . Hyperlipidemia   . Rheumatoid arthritis(714.0)   . Asthma   . Hiatal hernia   . History of chemotherapy   . History of radiation therapy 03/06/2012    left hilum  . History of radiation therapy 05/10/2013-05/31/2013    Left lung/ 33/75'@2'$ .25 per fraction x 15 fractions  . Radiation 11/15/13-11/26/13    Right hilum 30 Gy in 10 fractions  . Non-small cell lung cancer ([place]) dx'd 08/28/11    left lung  . Encounter for antineoplastic immunotherapy 05/22/2015  . DNR (do not resuscitate) 06/26/2015  . Primary cancer of right upper lobe of lung ([place]) 02/06/2012    ALLERGIES:  is allergic to shellfish allergy.  MEDICATIONS:  Current Outpatient Prescriptions  Medication Sig Dispense Refill  . albuterol (PROAIR HFA) 108 (90 BASE) MCG/ACT inhaler Inhale 2 puffs into the lungs every  6 (six) hours as needed for wheezing or shortness of breath. 1 Inhaler 5  . albuterol (PROVENTIL) (2.5 MG/3ML) 0.083% nebulizer solution Take 2.5 mg by nebulization every 8 (eight) hours.    . budesonide (PULMICORT) 0.25 MG/2ML nebulizer solution Take 2 mLs (0.25 mg total) by nebulization 2 (two) times daily. 60 mL 12  . calcium-vitamin D (OSCAL WITH D) 500-200 MG-UNIT per tablet Take 1 tablet by mouth daily.    . chlorpheniramine-HYDROcodone  (TUSSIONEX) 10-8 MG/5ML SUER Take 5 mLs by mouth every 8 (eight) hours as needed for cough. 194 mL 0  . folic acid (FOLVITE) 174 MCG tablet Take 400 mcg by mouth daily.    Marland Kitchen guaiFENesin (MUCINEX) 600 MG 12 hr tablet Take 1 tablet (600 mg total) by mouth 2 (two) times daily. 30 tablet 1  . HYDROcodone-acetaminophen (NORCO/VICODIN) 5-325 MG tablet Take 1 tablet by mouth every 4 (four) hours as needed for moderate pain. 30 tablet 0  . ipratropium (ATROVENT) 0.02 % nebulizer solution Take 0.5 mg by nebulization every 8 (eight) hours.    . ondansetron (ZOFRAN ODT) 8 MG disintegrating tablet Take 1 tablet (8 mg total) by mouth every 8 (eight) hours as needed for nausea or vomiting. (Patient not taking: Reported on 11/07/2015) 20 tablet 0  . OXYGEN Inhale 2 L into the lungs continuous.    . predniSONE (DELTASONE) 5 MG tablet Take 5 mg by mouth daily.    . prochlorperazine (COMPAZINE) 10 MG tablet Take 1 tablet (10 mg total) by mouth every 6 (six) hours as needed for nausea or vomiting. (Patient not taking: Reported on 09/19/2015) 30 tablet 0  . simvastatin (ZOCOR) 5 MG tablet Take 10 mg by mouth at bedtime.      No current facility-administered medications for this visit.    SURGICAL HISTORY:  Past Surgical History  Procedure Laterality Date  . Appendex  1962  . Video bronchoscopy  01/28/2012    Procedure: VIDEO BRONCHOSCOPY WITHOUT FLUORO;  Surgeon: Brand Males, MD;  Location: [facility] ENDOSCOPY;  Service: Endoscopy;  Laterality: Bilateral;  . Surgery on right wrist      REVIEW OF SYSTEMS:  Constitutional: negative Eyes: negative Ears, nose, mouth, throat, and face: negative Respiratory: positive for cough Cardiovascular: negative Gastrointestinal: negative Genitourinary:negative Integument/breast: negative Hematologic/lymphatic: negative Musculoskeletal:negative Neurological: negative Behavioral/Psych: negative Endocrine: negative Allergic/Immunologic: negative   PHYSICAL EXAMINATION:  General appearance: alert, cooperative and no distress Head: Normocephalic, without obvious abnormality, atraumatic Neck: no adenopathy, no JVD, supple, symmetrical, trachea midline and thyroid not enlarged, symmetric, no tenderness/mass/nodules Lymph nodes: Cervical, supraclavicular, and axillary nodes normal. Resp: clear to auscultation bilaterally Back: symmetric, no curvature. ROM normal. No CVA tenderness. Cardio: regular rate and rhythm, S1, S2 normal, no murmur, click, rub or gallop GI: soft, non-tender; bowel sounds normal; no masses,  no organomegaly Extremities: extremities normal, atraumatic, no cyanosis or edema Neurologic: Alert and oriented X 3, normal strength and tone. Normal symmetric reflexes. Normal coordination and gait  ECOG PERFORMANCE STATUS: 1 - Symptomatic but completely ambulatory  Blood pressure 115/69, pulse 120, temperature 98.5 F (36.9 C), temperature source Oral, resp. rate 19, height '5\' 5"'$  (1.651 m), weight 133 lb 3.2 oz (60.419 kg), SpO2 97 %.  LABORATORY DATA: Lab Results  Component Value Date   WBC 6.4 11/21/2015   HGB 12.0 11/21/2015   HCT 36.9 11/21/2015   MCV 82.1 11/21/2015   PLT 297 11/21/2015      Chemistry      Component Value Date/Time   NA  139 11/07/2015 0852   NA 139 08/19/2015 0440   K 4.6 11/07/2015 0852   K 4.1 08/19/2015 0440   CL 103 08/19/2015 0440   CL 101 01/12/2013 0810   CO2 28 11/07/2015 0852   CO2 31 08/19/2015 0440   BUN 10.0 11/07/2015 0852   BUN 16 08/19/2015 0440   CREATININE 1.0 11/07/2015 0852   CREATININE 0.82 08/19/2015 0440      Component Value Date/Time   CALCIUM 9.7 11/07/2015 0852   CALCIUM 8.9 08/19/2015 0440   ALKPHOS 70 11/07/2015 0852   ALKPHOS 72 08/18/2015 0100   AST 14 11/07/2015 0852   AST 22 08/18/2015 0100   ALT 10 11/07/2015 0852   ALT 14 08/18/2015 0100   BILITOT 0.44 11/07/2015 0852   BILITOT 0.9 08/18/2015 0100       RADIOGRAPHIC STUDIES: Ct Chest W Contrast  11/20/2015   CLINICAL DATA:  Squamous cell lung carcinoma of the left upper lobe diagnosed on 01/28/2012, status post radiation therapy completed 03/06/2012, status post chemotherapy has status post right hilar palliative radiation therapy completed 11/26/2013, currently on immunotherapy. EXAM: CT CHEST WITH CONTRAST TECHNIQUE: Multidetector CT imaging of the chest was performed during intravenous contrast administration. CONTRAST:  9m OMNIPAQUE IOHEXOL 300 MG/ML  SOLN COMPARISON:  09/12/2015 chest CT. FINDINGS: Mediastinum/Nodes: Normal heart size. No pericardial fluid/thickening. Coronary atherosclerosis. Right internal jugular MediPort terminates at the cavoatrial junction. Great vessels are normal in course and caliber. No central pulmonary emboli. Stable subcentimeter hypodense bilateral thyroid lobe nodules. Normal esophagus. No axillary adenopathy. Bulky high right paratracheal 3.6 cm node (series 2/ image 11) is increased from 3.0 cm on 09/12/2015. No additional pathologically enlarged mediastinal nodes. No left hilar adenopathy. Lungs/Pleura: No pneumothorax. No pleural effusion. Central right middle lobe/ right infrahilar 4.0 x 2.3 cm mass (series 5/ image 29) is increased from 3.5 x 2.2 cm on 09/12/2015 using similar measurement technique. Stable complete right middle lobe atelectasis. Stable sharply marginated consolidation, distortion and volume loss in the parahilar left upper lobe and parahilar superior segment left lower lobe. Moderate centrilobular emphysema and diffuse bronchial wall thickening. No acute consolidative airspace disease or new significant pulmonary nodules. Upper abdomen: Unremarkable. Musculoskeletal: No aggressive appearing focal osseous lesions. Stable moderate T4 and mild T5 compression fractures. Moderate degenerative changes in the mid thoracic spine. IMPRESSION: 1. Interval growth of bulky high right paratracheal nodal metastasis. 2. Mild interval growth of central right middle  lobe/right infrahilar mass with complete right middle lobe atelectasis. 3. No additional sites of metastatic disease in the chest. 4. 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 5. Coronary atherosclerosis, moderate centrilobular emphysema and stable moderate T4 mild T5 compression fractures. Electronically Signed   By: JIlona SorrelM.D.   On: 11/20/2015 16:26   ASSESSMENT AND PLAN: This is a very pleasant 75years old African American female with metastatic non-small cell lung cancer, squamous cell carcinoma status post several chemotherapy regimen and and completed a course of palliative radiotherapy to the right hilum in February 2015.  She is currently on treatment with immunotherapy with Nivolumab status post 16 cycles. The patient is doing fine and tolerating her treatment fairly well.  His recent CT scan of the chest showed mild increase in the central right upper lobe/right infrahilar mass as well as a right paratracheal nodal metastasis and stable disease otherwise. I discussed the scan results and showed the images to the patient today.  She is currently asymptomatic, I recommended for her to continue her current treatment with immunotherapy with Nivolumab as a scheduled. I recommended for the patient to proceed with cycle #17 today. I will discuss with Dr. Pablo Ledger consideration of palliative radiotherapy to this area of possible. The patient would come back for follow-up visit in 2 weeks for reevaluation before starting cycle #18.  For the cough, she will continue on Tussinox She was advised to call immediately if she has any concerning symptoms in the interval.  The patient voices understanding of current disease status and treatment options and is in agreement with the current care plan.  All questions were answered. The patient knows to call the clinic with any problems, questions or concerns. We can certainly  see the patient much sooner if necessary.  Disclaimer: This note was dictated with voice recognition software. Similar sounding words can inadvertently be transcribed and may not be corrected upon review.

## 2015-12-06 ENCOUNTER — Other Ambulatory Visit: Payer: Self-pay

## 2015-12-06 NOTE — Patient Outreach (Signed)
[facility]) Care Management  12/06/2015  Aulani Shipton 1941-04-26 [id number]  SUBJECTIVE: Telephone call to patient regarding [facility] high risk referral. HIPAA verified with patient. Discussed and offered [facility] care management services. Patient refused services at this time. Patient states she feels she is getting what she needs at this time. Patient states she is followed by Dr. Earlie Server with [facility]. Patient states she also has a nurse that calls and checks up with her every 6 weeks.  Patient denies having any needs at this time.  PLAN:  RNCM will refer patient to [facility] Pigg to close due to refusal of services.  RNCM contacted Dr. Doristine Devoid office and notified referral nurse by voicemail of patients refusal of services. RNCM requested assistance with engaging patient with [facility] care management services.   Quinn Plowman RN,BSN,CCM [facility] Telephonic  [phone]

## 2015-12-07 ENCOUNTER — Telehealth: Payer: Self-pay | Admitting: Medical Oncology

## 2015-12-07 ENCOUNTER — Telehealth: Payer: Self-pay | Admitting: *Deleted

## 2015-12-07 ENCOUNTER — Encounter: Payer: Self-pay | Admitting: Internal Medicine

## 2015-12-07 ENCOUNTER — Other Ambulatory Visit (HOSPITAL_BASED_OUTPATIENT_CLINIC_OR_DEPARTMENT_OTHER): Payer: Medicare Other

## 2015-12-07 ENCOUNTER — Other Ambulatory Visit: Payer: Self-pay | Admitting: Pharmacist

## 2015-12-07 ENCOUNTER — Ambulatory Visit (HOSPITAL_BASED_OUTPATIENT_CLINIC_OR_DEPARTMENT_OTHER): Payer: Medicare Other | Admitting: Internal Medicine

## 2015-12-07 ENCOUNTER — Telehealth: Payer: Self-pay | Admitting: Internal Medicine

## 2015-12-07 ENCOUNTER — Ambulatory Visit (HOSPITAL_BASED_OUTPATIENT_CLINIC_OR_DEPARTMENT_OTHER): Payer: Medicare Other

## 2015-12-07 VITALS — BP 154/72 | HR 100 | Temp 98.2°F | Resp 22

## 2015-12-07 VITALS — BP 129/86 | HR 97 | Temp 98.2°F | Resp 18 | Ht 65.0 in | Wt 133.0 lb

## 2015-12-07 DIAGNOSIS — R0609 Other forms of dyspnea: Secondary | ICD-10-CM

## 2015-12-07 DIAGNOSIS — Z5112 Encounter for antineoplastic immunotherapy: Secondary | ICD-10-CM

## 2015-12-07 DIAGNOSIS — R1319 Other dysphagia: Secondary | ICD-10-CM

## 2015-12-07 DIAGNOSIS — C3490 Malignant neoplasm of unspecified part of unspecified bronchus or lung: Secondary | ICD-10-CM | POA: Diagnosis not present

## 2015-12-07 DIAGNOSIS — R05 Cough: Secondary | ICD-10-CM | POA: Diagnosis not present

## 2015-12-07 DIAGNOSIS — C3411 Malignant neoplasm of upper lobe, right bronchus or lung: Secondary | ICD-10-CM | POA: Diagnosis present

## 2015-12-07 DIAGNOSIS — E038 Other specified hypothyroidism: Secondary | ICD-10-CM

## 2015-12-07 DIAGNOSIS — R1310 Dysphagia, unspecified: Secondary | ICD-10-CM

## 2015-12-07 HISTORY — DX: Dysphagia, unspecified: R13.10

## 2015-12-07 LAB — CBC WITH DIFFERENTIAL/PLATELET
BASO%: 0.6 % (ref 0.0–2.0)
BASOS ABS: 0 10*3/uL (ref 0.0–0.1)
EOS%: 3.1 % (ref 0.0–7.0)
Eosinophils Absolute: 0.2 10*3/uL (ref 0.0–0.5)
HEMATOCRIT: 35.6 % (ref 34.8–46.6)
HGB: 11.6 g/dL (ref 11.6–15.9)
LYMPH%: 21.9 % (ref 14.0–49.7)
MCH: 26.6 pg (ref 25.1–34.0)
MCHC: 32.6 g/dL (ref 31.5–36.0)
MCV: 81.6 fL (ref 79.5–101.0)
MONO#: 0.6 10*3/uL (ref 0.1–0.9)
MONO%: 10.7 % (ref 0.0–14.0)
NEUT#: 3.7 10*3/uL (ref 1.5–6.5)
NEUT%: 63.7 % (ref 38.4–76.8)
Platelets: 291 10*3/uL (ref 145–400)
RBC: 4.37 10*6/uL (ref 3.70–5.45)
RDW: 14 % (ref 11.2–14.5)
WBC: 5.8 10*3/uL (ref 3.9–10.3)
lymph#: 1.3 10*3/uL (ref 0.9–3.3)

## 2015-12-07 LAB — COMPREHENSIVE METABOLIC PANEL
ALT: 11 U/L (ref 0–55)
AST: 15 U/L (ref 5–34)
Albumin: 3.5 g/dL (ref 3.5–5.0)
Alkaline Phosphatase: 71 U/L (ref 40–150)
Anion Gap: 10 mEq/L (ref 3–11)
BUN: 10.2 mg/dL (ref 7.0–26.0)
CALCIUM: 9.6 mg/dL (ref 8.4–10.4)
CHLORIDE: 100 meq/L (ref 98–109)
CO2: 29 meq/L (ref 22–29)
Creatinine: 0.9 mg/dL (ref 0.6–1.1)
EGFR: 71 mL/min/{1.73_m2} — ABNORMAL LOW (ref >90)
Glucose: 123 mg/dl (ref 70–140)
POTASSIUM: 3.9 meq/L (ref 3.5–5.1)
SODIUM: 139 meq/L (ref 136–145)
Total Bilirubin: 0.48 mg/dL (ref 0.20–1.20)
Total Protein: 7.4 g/dL (ref 6.4–8.3)

## 2015-12-07 MED ORDER — SODIUM CHLORIDE 0.9 % IV SOLN
240.0000 mg | Freq: Once | INTRAVENOUS | Status: AC
  Administered 2015-12-07: 240 mg via INTRAVENOUS
  Filled 2015-12-07: qty 8

## 2015-12-07 MED ORDER — SODIUM CHLORIDE 0.9 % IV SOLN
Freq: Once | INTRAVENOUS | Status: AC
  Administered 2015-12-07: 15:00:00 via INTRAVENOUS

## 2015-12-07 MED ORDER — HEPARIN SOD (PORK) LOCK FLUSH 100 UNIT/ML IV SOLN
500.0000 [IU] | Freq: Once | INTRAVENOUS | Status: AC | PRN
  Administered 2015-12-07: 500 [IU]
  Filled 2015-12-07: qty 5

## 2015-12-07 MED ORDER — HYDROCODONE-ACETAMINOPHEN 5-325 MG PO TABS
1.0000 | ORAL_TABLET | ORAL | Status: DC | PRN
Start: 2015-12-07 — End: 2015-12-21

## 2015-12-07 MED ORDER — SODIUM CHLORIDE 0.9 % IJ SOLN
10.0000 mL | INTRAMUSCULAR | Status: DC | PRN
  Administered 2015-12-07: 10 mL
  Filled 2015-12-07: qty 10

## 2015-12-07 NOTE — Patient Instructions (Addendum)
[facility] Discharge Instructions for Patients Receiving Chemotherapy  Today you received the following chemotherapy agents: Nivolumab  To help prevent nausea and vomiting after your treatment, we encourage you to take your nausea medication: Compazine. Take one every 6 hours as needed.   If you develop nausea and vomiting that is not controlled by your nausea medication, call the clinic.   BELOW ARE SYMPTOMS THAT SHOULD BE REPORTED IMMEDIATELY:  *FEVER GREATER THAN 100.5 F  *CHILLS WITH OR WITHOUT FEVER  NAUSEA AND VOMITING THAT IS NOT CONTROLLED WITH YOUR NAUSEA MEDICATION  *UNUSUAL SHORTNESS OF BREATH  *UNUSUAL BRUISING OR BLEEDING  TENDERNESS IN MOUTH AND THROAT WITH OR WITHOUT PRESENCE OF ULCERS  *URINARY PROBLEMS  *BOWEL PROBLEMS  UNUSUAL RASH Items with * indicate a potential emergency and should be followed up as soon as possible.  Feel free to call the clinic should you have any questions or concerns. The clinic phone number is [phone].  Please show the [facility] at check-in to the Emergency Department and triage nurse.

## 2015-12-07 NOTE — Progress Notes (Signed)
[facility] [phone]   Fax:(336) [facility], [place], [address] [place] [address]  DIAGNOSIS: Metastatic non-small cell lung cancer, squamous cell carcinoma diagnosed in March of 2013.   PRIOR THERAPY:  1. Status post palliative radiotherapy to the left lung mass under the care of Dr. Pablo Ledger completed on 03/06/2012.  2. Systemic chemotherapy with carboplatin for AUC of 6 on day 1 and Abraxane 100 mg/M2 on days 1, 8 and 15 every 3 weeks. Status post 2 cycles. From cycle 3 forward AUC will be decreased to 4.5 given on day 1 and the Abraxane will be decreased to 90 mg per meter squared on days 1, 8 and 15 every 3 weeks, Status post a total of 3 cycles.  3. Systemic chemotherapy with carboplatin for AUC of 5 on day 1 and gemcitabine 1000 mg/m2 given on day 1 and day 8 every 3 weeks,status post 1 cycle. Due to significant neutropenia she will be dosed reduced beginning cycle 2 forward to carboplatin at an AUC of 4 given on day 1 and gemcitabine at 800 mg per meter squared given on days 1 and 8 every 3 weeks. Status post 6 cycles.  4. Status post palliative radiotherapy to the right hilum under the care of Dr. Pablo Ledger completed on 11/26/2013.  CURRENT THERAPY: Immunotherapy with Nivolumab 240 MG every 2 weeks. First dose expected 03/07/2015. Status post 17 cycles.  CHEMOTHERAPY INTENT: Palliative  CURRENT # OF CHEMOTHERAPY CYCLES: 18  CURRENT ANTIEMETICS: Compazine  CURRENT SMOKING STATUS: Former smoker  ORAL CHEMOTHERAPY AND CONSENT: None  CURRENT BISPHOSPHONATES USE: None  PAIN MANAGEMENT: 5/10 right shoulder currently on Norco  NARCOTICS INDUCED CONSTIPATION: None  LIVING WILL AND CODE STATUS: No CODE BLUE   INTERVAL HISTORY: Destiny Harrison 75 y.o. female returns to the clinic today for followup visit accompanied by her son. The patient is currently on immunotherapy with Nivolumab every 2  weeks and tolerating it fairly well. The patient has no complaints today except for the persistent dry cough and some difficulty swallowing in the neck area. She denied having any significant weight loss or night sweats. She has no chest pain, but has shortness of breath increased with exertion and cough with no hemoptysis. The patient denied having any significant fever or chills, nausea or vomiting. She is here today to start cycle #18 of her immunotherapy.  MEDICAL HISTORY: Past Medical History  Diagnosis Date  . COPD (chronic obstructive pulmonary disease) ([facility])   . Neutropenia, drug-induced ([facility]) 05/05/2012  . Hyperlipidemia   . Rheumatoid arthritis(714.0)   . Asthma   . Hiatal hernia   . History of chemotherapy   . History of radiation therapy 03/06/2012    left hilum  . History of radiation therapy 05/10/2013-05/31/2013    Left lung/ 33/75'@2'$ .25 per fraction x 15 fractions  . Radiation 11/15/13-11/26/13    Right hilum 30 Gy in 10 fractions  . Non-small cell lung cancer ([facility]) dx'd 08/28/11    left lung  . Encounter for antineoplastic immunotherapy 05/22/2015  . DNR (do not resuscitate) 06/26/2015  . Primary cancer of right upper lobe of lung ([facility]) 02/06/2012    ALLERGIES:  is allergic to shellfish allergy.  MEDICATIONS:  Current Outpatient Prescriptions  Medication Sig Dispense Refill  . albuterol (PROAIR HFA) 108 (90 BASE) MCG/ACT inhaler Inhale 2 puffs into the lungs every 6 (six) hours as needed for wheezing or shortness of breath. 1 Inhaler  5  . albuterol (PROVENTIL) (2.5 MG/3ML) 0.083% nebulizer solution Take 2.5 mg by nebulization every 8 (eight) hours.    . budesonide (PULMICORT) 0.25 MG/2ML nebulizer solution Take 2 mLs (0.25 mg total) by nebulization 2 (two) times daily. 60 mL 12  . calcium-vitamin D (OSCAL WITH D) 500-200 MG-UNIT per tablet Take 1 tablet by mouth daily.    . chlorpheniramine-HYDROcodone (TUSSIONEX) 10-8 MG/5ML SUER Take 5 mLs by mouth every 8 (eight) hours as  needed for cough. 161 mL 0  . folic acid (FOLVITE) 096 MCG tablet Take 400 mcg by mouth daily.    Marland Kitchen guaiFENesin (MUCINEX) 600 MG 12 hr tablet Take 1 tablet (600 mg total) by mouth 2 (two) times daily. 30 tablet 1  . HYDROcodone-acetaminophen (NORCO/VICODIN) 5-325 MG tablet Take 1 tablet by mouth every 4 (four) hours as needed for moderate pain. 30 tablet 0  . ipratropium (ATROVENT) 0.02 % nebulizer solution Take 0.5 mg by nebulization every 8 (eight) hours.    . OXYGEN Inhale 2 L into the lungs continuous.    . predniSONE (DELTASONE) 5 MG tablet Take 5 mg by mouth daily.    . simvastatin (ZOCOR) 5 MG tablet Take 10 mg by mouth at bedtime.     . ondansetron (ZOFRAN ODT) 8 MG disintegrating tablet Take 1 tablet (8 mg total) by mouth every 8 (eight) hours as needed for nausea or vomiting. (Patient not taking: Reported on 11/07/2015) 20 tablet 0  . prochlorperazine (COMPAZINE) 10 MG tablet Take 1 tablet (10 mg total) by mouth every 6 (six) hours as needed for nausea or vomiting. (Patient not taking: Reported on 09/19/2015) 30 tablet 0   No current facility-administered medications for this visit.    SURGICAL HISTORY:  Past Surgical History  Procedure Laterality Date  . Appendex  1962  . Video bronchoscopy  01/28/2012    Procedure: VIDEO BRONCHOSCOPY WITHOUT FLUORO;  Surgeon: Brand Males, MD;  Location: [facility] ENDOSCOPY;  Service: Endoscopy;  Laterality: Bilateral;  . Surgery on right wrist      REVIEW OF SYSTEMS:  A comprehensive review of systems was negative except for: Respiratory: positive for cough Gastrointestinal: positive for dysphagia   PHYSICAL EXAMINATION: General appearance: alert, cooperative and no distress Head: Normocephalic, without obvious abnormality, atraumatic Neck: no adenopathy, no JVD, supple, symmetrical, trachea midline and thyroid not enlarged, symmetric, no tenderness/mass/nodules Lymph nodes: Cervical, supraclavicular, and axillary nodes normal. Resp: clear to  auscultation bilaterally Back: symmetric, no curvature. ROM normal. No CVA tenderness. Cardio: regular rate and rhythm, S1, S2 normal, no murmur, click, rub or gallop GI: soft, non-tender; bowel sounds normal; no masses,  no organomegaly Extremities: extremities normal, atraumatic, no cyanosis or edema Neurologic: Alert and oriented X 3, normal strength and tone. Normal symmetric reflexes. Normal coordination and gait  ECOG PERFORMANCE STATUS: 1 - Symptomatic but completely ambulatory  Blood pressure 129/86, pulse 97, temperature 98.2 F (36.8 C), temperature source Oral, resp. rate 18, height '5\' 5"'$  (1.651 m), weight 133 lb (60.328 kg), SpO2 100 %.  LABORATORY DATA: Lab Results  Component Value Date   WBC 5.8 12/07/2015   HGB 11.6 12/07/2015   HCT 35.6 12/07/2015   MCV 81.6 12/07/2015   PLT 291 12/07/2015      Chemistry      Component Value Date/Time   NA 139 11/21/2015 0905   NA 139 08/19/2015 0440   K 4.1 11/21/2015 0905   K 4.1 08/19/2015 0440   CL 103 08/19/2015 0440   CL 101  01/12/2013 0810   CO2 27 11/21/2015 0905   CO2 31 08/19/2015 0440   BUN 10.8 11/21/2015 0905   BUN 16 08/19/2015 0440   CREATININE 1.0 11/21/2015 0905   CREATININE 0.82 08/19/2015 0440      Component Value Date/Time   CALCIUM 10.0 11/21/2015 0905   CALCIUM 8.9 08/19/2015 0440   ALKPHOS 69 11/21/2015 0905   ALKPHOS 72 08/18/2015 0100   AST 14 11/21/2015 0905   AST 22 08/18/2015 0100   ALT 12 11/21/2015 0905   ALT 14 08/18/2015 0100   BILITOT 0.57 11/21/2015 0905   BILITOT 0.9 08/18/2015 0100       RADIOGRAPHIC STUDIES: Ct Chest W Contrast  11/20/2015  CLINICAL DATA:  Squamous cell lung carcinoma of the left upper lobe diagnosed on 01/28/2012, status post radiation therapy completed 03/06/2012, status post chemotherapy has status post right hilar palliative radiation therapy completed 11/26/2013, currently on immunotherapy. EXAM: CT CHEST WITH CONTRAST TECHNIQUE: Multidetector CT imaging  of the chest was performed during intravenous contrast administration. CONTRAST:  37m OMNIPAQUE IOHEXOL 300 MG/ML  SOLN COMPARISON:  09/12/2015 chest CT. FINDINGS: Mediastinum/Nodes: Normal heart size. No pericardial fluid/thickening. Coronary atherosclerosis. Right internal jugular MediPort terminates at the cavoatrial junction. Great vessels are normal in course and caliber. No central pulmonary emboli. Stable subcentimeter hypodense bilateral thyroid lobe nodules. Normal esophagus. No axillary adenopathy. Bulky high right paratracheal 3.6 cm node (series 2/ image 11) is increased from 3.0 cm on 09/12/2015. No additional pathologically enlarged mediastinal nodes. No left hilar adenopathy. Lungs/Pleura: No pneumothorax. No pleural effusion. Central right middle lobe/ right infrahilar 4.0 x 2.3 cm mass (series 5/ image 29) is increased from 3.5 x 2.2 cm on 09/12/2015 using similar measurement technique. Stable complete right middle lobe atelectasis. Stable sharply marginated consolidation, distortion and volume loss in the parahilar left upper lobe and parahilar superior segment left lower lobe. Moderate centrilobular emphysema and diffuse bronchial wall thickening. No acute consolidative airspace disease or new significant pulmonary nodules. Upper abdomen: Unremarkable. Musculoskeletal: No aggressive appearing focal osseous lesions. Stable moderate T4 and mild T5 compression fractures. Moderate degenerative changes in the mid thoracic spine. IMPRESSION: 1. Interval growth of bulky high right paratracheal nodal metastasis. 2. Mild interval growth of central right middle lobe/right infrahilar mass with complete right middle lobe atelectasis. 3. No additional sites of metastatic disease in the chest. 4. 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 5. Coronary atherosclerosis, moderate centrilobular emphysema and stable moderate T4 mild T5  compression fractures. Electronically Signed   By: JIlona SorrelM.D.   On: 11/20/2015 16:26   ASSESSMENT AND PLAN: This is a very pleasant 75years old African American female with metastatic non-small cell lung cancer, squamous cell carcinoma status post several chemotherapy regimen and and completed a course of palliative radiotherapy to the right hilum in February 2015.  She is currently on treatment with immunotherapy with Nivolumab status post 17 cycles. The patient is doing fine and tolerating her treatment fairly well.  She has mild upper thoracic dysphagia.  I will discuss with Dr. WPablo Ledgerconsideration of palliative radiotherapy to this area of possible. I recommended for the patient to continue her treatment with Nivolumab as a scheduled. She will receive cycle #18 today. The patient would come back for follow-up visit in 2 weeks for reevaluation before starting cycle #19..  For the cough, she will continue on Tussinox. She was given a refill of  her pain medication today. She was advised to call immediately if she has any concerning symptoms in the interval.  The patient voices understanding of current disease status and treatment options and is in agreement with the current care plan.  All questions were answered. The patient knows to call the clinic with any problems, questions or concerns. We can certainly see the patient much sooner if necessary.  Disclaimer: This note was dictated with voice recognition software. Similar sounding words can inadvertently be transcribed and may not be corrected upon review.

## 2015-12-07 NOTE — Telephone Encounter (Signed)
Per staff message and POF I have scheduled appts. Advised scheduler of appts. JMW  

## 2015-12-07 NOTE — Telephone Encounter (Signed)
per pof to sch pt appt-sent MW email to sch pt trmt-adv pt willc all after reply

## 2015-12-07 NOTE — Telephone Encounter (Signed)
cld pt and left messsage of appt time & date for 3/2

## 2015-12-07 NOTE — Telephone Encounter (Signed)
"  I just spoke with scheduler five minutes ago.  I can come back in this afternoon for treatment."  Call transferred to collaborative.

## 2015-12-21 ENCOUNTER — Other Ambulatory Visit (HOSPITAL_BASED_OUTPATIENT_CLINIC_OR_DEPARTMENT_OTHER): Payer: Medicare Other

## 2015-12-21 ENCOUNTER — Encounter: Payer: Self-pay | Admitting: Nurse Practitioner

## 2015-12-21 ENCOUNTER — Ambulatory Visit (HOSPITAL_BASED_OUTPATIENT_CLINIC_OR_DEPARTMENT_OTHER): Payer: Medicare Other | Admitting: Nurse Practitioner

## 2015-12-21 ENCOUNTER — Ambulatory Visit (HOSPITAL_BASED_OUTPATIENT_CLINIC_OR_DEPARTMENT_OTHER): Payer: Medicare Other

## 2015-12-21 VITALS — BP 161/73 | HR 109 | Temp 97.7°F | Resp 18 | Ht 65.0 in | Wt 132.0 lb

## 2015-12-21 DIAGNOSIS — Z79899 Other long term (current) drug therapy: Secondary | ICD-10-CM

## 2015-12-21 DIAGNOSIS — C3411 Malignant neoplasm of upper lobe, right bronchus or lung: Secondary | ICD-10-CM

## 2015-12-21 DIAGNOSIS — R52 Pain, unspecified: Secondary | ICD-10-CM | POA: Diagnosis not present

## 2015-12-21 DIAGNOSIS — C3412 Malignant neoplasm of upper lobe, left bronchus or lung: Secondary | ICD-10-CM | POA: Diagnosis not present

## 2015-12-21 DIAGNOSIS — R5383 Other fatigue: Secondary | ICD-10-CM

## 2015-12-21 DIAGNOSIS — R1319 Other dysphagia: Secondary | ICD-10-CM | POA: Diagnosis not present

## 2015-12-21 DIAGNOSIS — R110 Nausea: Secondary | ICD-10-CM | POA: Diagnosis not present

## 2015-12-21 DIAGNOSIS — C3490 Malignant neoplasm of unspecified part of unspecified bronchus or lung: Secondary | ICD-10-CM

## 2015-12-21 DIAGNOSIS — G8929 Other chronic pain: Secondary | ICD-10-CM | POA: Diagnosis not present

## 2015-12-21 DIAGNOSIS — R530 Neoplastic (malignant) related fatigue: Secondary | ICD-10-CM

## 2015-12-21 DIAGNOSIS — M545 Low back pain: Secondary | ICD-10-CM | POA: Diagnosis not present

## 2015-12-21 DIAGNOSIS — R0609 Other forms of dyspnea: Secondary | ICD-10-CM | POA: Diagnosis not present

## 2015-12-21 DIAGNOSIS — Z5112 Encounter for antineoplastic immunotherapy: Secondary | ICD-10-CM

## 2015-12-21 DIAGNOSIS — E038 Other specified hypothyroidism: Secondary | ICD-10-CM

## 2015-12-21 LAB — CBC WITH DIFFERENTIAL/PLATELET
BASO%: 0.7 % (ref 0.0–2.0)
BASOS ABS: 0 10*3/uL (ref 0.0–0.1)
EOS ABS: 0.2 10*3/uL (ref 0.0–0.5)
EOS%: 3 % (ref 0.0–7.0)
HEMATOCRIT: 35.2 % (ref 34.8–46.6)
HGB: 11.7 g/dL (ref 11.6–15.9)
LYMPH#: 1.1 10*3/uL (ref 0.9–3.3)
LYMPH%: 18.5 % (ref 14.0–49.7)
MCH: 26.9 pg (ref 25.1–34.0)
MCHC: 33.1 g/dL (ref 31.5–36.0)
MCV: 81.1 fL (ref 79.5–101.0)
MONO#: 0.7 10*3/uL (ref 0.1–0.9)
MONO%: 12.4 % (ref 0.0–14.0)
NEUT#: 3.8 10*3/uL (ref 1.5–6.5)
NEUT%: 65.4 % (ref 38.4–76.8)
PLATELETS: 304 10*3/uL (ref 145–400)
RBC: 4.34 10*6/uL (ref 3.70–5.45)
RDW: 14.4 % (ref 11.2–14.5)
WBC: 5.8 10*3/uL (ref 3.9–10.3)

## 2015-12-21 LAB — COMPREHENSIVE METABOLIC PANEL
ALK PHOS: 67 U/L (ref 40–150)
ALT: <9 U/L (ref 0–55)
ANION GAP: 11 meq/L (ref 3–11)
AST: 16 U/L (ref 5–34)
Albumin: 3.5 g/dL (ref 3.5–5.0)
BUN: 13.9 mg/dL (ref 7.0–26.0)
CALCIUM: 9.7 mg/dL (ref 8.4–10.4)
CHLORIDE: 100 meq/L (ref 98–109)
CO2: 27 mEq/L (ref 22–29)
Creatinine: 0.9 mg/dL (ref 0.6–1.1)
EGFR: 70 mL/min/{1.73_m2} — ABNORMAL LOW (ref >90)
Glucose: 107 mg/dl (ref 70–140)
POTASSIUM: 3.8 meq/L (ref 3.5–5.1)
Sodium: 138 mEq/L (ref 136–145)
Total Bilirubin: 0.51 mg/dL (ref 0.20–1.20)
Total Protein: 7.8 g/dL (ref 6.4–8.3)

## 2015-12-21 LAB — TSH: TSH: 0.893 m[IU]/L (ref 0.308–3.960)

## 2015-12-21 MED ORDER — HYDROCODONE-ACETAMINOPHEN 5-325 MG PO TABS: 1.0000 | ORAL_TABLET | ORAL | Status: DC | PRN

## 2015-12-21 MED ORDER — HEPARIN SOD (PORK) LOCK FLUSH 100 UNIT/ML IV SOLN
500.0000 [IU] | Freq: Once | INTRAVENOUS | Status: AC | PRN
  Administered 2015-12-21: 500 [IU]
  Filled 2015-12-21: qty 5

## 2015-12-21 MED ORDER — SODIUM CHLORIDE 0.9 % IV SOLN
Freq: Once | INTRAVENOUS | Status: AC
  Administered 2015-12-21: 10:00:00 via INTRAVENOUS

## 2015-12-21 MED ORDER — SODIUM CHLORIDE 0.9 % IJ SOLN
10.0000 mL | INTRAMUSCULAR | Status: DC | PRN
  Administered 2015-12-21: 10 mL
  Filled 2015-12-21: qty 10

## 2015-12-21 MED ORDER — SODIUM CHLORIDE 0.9 % IV SOLN
240.0000 mg | Freq: Once | INTRAVENOUS | Status: AC
  Administered 2015-12-21: 240 mg via INTRAVENOUS
  Filled 2015-12-21: qty 20

## 2015-12-21 NOTE — Assessment & Plan Note (Signed)
Patient presents to the [facility] today to receive cycle 19 of her Opdivo immunotherapy.  Patient states she's been tolerating the immunotherapy treatment fairly well; with her major complaints being mild, generalized fatigue and some occasional nausea.  She has not vomited.  She denies any other GI issues.  She states that her appetite is good.  She denies any recent fevers or chills.  Dr. Julien Nordmann in to see patient as well.  He states that he has followed up with Dr. Pablo Ledger radiation oncologist; to discuss option of radiation therapy to the esophageal area.  Unfortunately, Dr. Pablo Ledger feels that patient is not a candidate for any further radiation to this area.  The patient is scheduled to return on 01/04/2016 for labs, physical, and her next immunotherapy infusion.  Following this next cycle of her immunotherapy; patient will then need to be scheduled for restaging scan.

## 2015-12-21 NOTE — Patient Instructions (Signed)
[facility] Discharge Instructions for Patients Receiving Chemotherapy  Today you received the following chemotherapy agents: Nivolumab  To help prevent nausea and vomiting after your treatment, we encourage you to take your nausea medication: Compazine. Take one every 6 hours as needed.   If you develop nausea and vomiting that is not controlled by your nausea medication, call the clinic.   BELOW ARE SYMPTOMS THAT SHOULD BE REPORTED IMMEDIATELY:  *FEVER GREATER THAN 100.5 F  *CHILLS WITH OR WITHOUT FEVER  NAUSEA AND VOMITING THAT IS NOT CONTROLLED WITH YOUR NAUSEA MEDICATION  *UNUSUAL SHORTNESS OF BREATH  *UNUSUAL BRUISING OR BLEEDING  TENDERNESS IN MOUTH AND THROAT WITH OR WITHOUT PRESENCE OF ULCERS  *URINARY PROBLEMS  *BOWEL PROBLEMS  UNUSUAL RASH Items with * indicate a potential emergency and should be followed up as soon as possible.  Feel free to call the clinic should you have any questions or concerns. The clinic phone number is [phone].  Please show the [facility] at check-in to the Emergency Department and triage nurse.

## 2015-12-21 NOTE — Progress Notes (Signed)
SYMPTOM MANAGEMENT CLINIC   HPI: Destiny Harrison 75 y.o. female diagnosed with lung cancer.  Patient is status post multiple chemotherapy regimens in the past and radiation treatments.  Currently undergoing chemotherapy immunotherapy.  Patient presents to the [facility] today to receive cycle 19 of her Opdivo immunotherapy.  Patient states she's been tolerating the immunotherapy treatment fairly well; with her major complaints being mild, generalized fatigue and some occasional nausea.  She has not vomited.  She denies any other GI issues.  She states that her appetite is good.  She denies any recent fevers or chills.  Dr. Julien Nordmann in to see patient as well.  He states that he has followed up with Dr. Pablo Ledger radiation oncologist; to discuss option of radiation therapy to the esophageal area.  Unfortunately, Dr. Pablo Ledger feels that patient is not a candidate for any further radiation to this area.  The patient is scheduled to return on 01/04/2016 for labs, physical, and her next immunotherapy infusion.  Following this next cycle of her immunotherapy; patient will then need to be scheduled for restaging scan.  HPI  Review of Systems  Constitutional: Positive for malaise/fatigue. Negative for fever and chills.  Respiratory: Positive for shortness of breath.        Wears chronic O2 via nasal cannula at 2 L.  Musculoskeletal: Positive for back pain.  All other systems reviewed and are negative.   Past Medical History  Diagnosis Date  . COPD (chronic obstructive pulmonary disease) ([place])   . Neutropenia, drug-induced ([place]) 05/05/2012  . Hyperlipidemia   . Rheumatoid arthritis(714.0)   . Asthma   . Hiatal hernia   . History of chemotherapy   . History of radiation therapy 03/06/2012    left hilum  . History of radiation therapy 05/10/2013-05/31/2013    Left lung/ 33/75@2 .25 per fraction x 15 fractions  . Radiation 11/15/13-11/26/13    Right hilum 30 Gy in 10 fractions  . Non-small cell  lung cancer ([place]) dx'd 08/28/11    left lung  . Encounter for antineoplastic immunotherapy 05/22/2015  . DNR (do not resuscitate) 06/26/2015  . Primary cancer of right upper lobe of lung ([place]) 02/06/2012  . Dysphagia 12/07/2015    Past Surgical History  Procedure Laterality Date  . Appendex  1962  . Video bronchoscopy  01/28/2012    Procedure: VIDEO BRONCHOSCOPY WITHOUT FLUORO;  Surgeon: Brand Males, MD;  Location: [facility] ENDOSCOPY;  Service: Endoscopy;  Laterality: Bilateral;  . Surgery on right wrist      has Chest mass; Dyspnea; COPD, moderate ([place]); Primary cancer of right upper lobe of lung ([place]); Community acquired pneumonia; Anemia; Chronic cough; Encounter for immunization; Fatigue; COPD exacerbation ([place]); Acute respiratory failure with hypoxia ([place]); Encounter for antineoplastic immunotherapy; DNR (do not resuscitate); Sinus tachycardia ([place]); HCAP (healthcare-associated pneumonia); Palliative care encounter; Dysphagia; Chronic pain; and Nausea without vomiting on her problem list.    is allergic to shellfish allergy.    Medication List       This list is accurate as of: 12/21/15  9:47 AM.  Always use your most recent med list.               albuterol (2.5 MG/3ML) 0.083% nebulizer solution  Commonly known as:  PROVENTIL  Take 2.5 mg by nebulization every 8 (eight) hours.     albuterol 108 (90 Base) MCG/ACT inhaler  Commonly known as:  PROAIR HFA  Inhale 2 puffs into the lungs every 6 (six) hours as needed for wheezing or  shortness of breath.     budesonide 0.25 MG/2ML nebulizer solution  Commonly known as:  PULMICORT  Take 2 mLs (0.25 mg total) by nebulization 2 (two) times daily.     calcium-vitamin D 500-200 MG-UNIT tablet  Commonly known as:  OSCAL WITH D  Take 1 tablet by mouth daily.     chlorpheniramine-HYDROcodone 10-8 MG/5ML Suer  Commonly known as:  TUSSIONEX  Take 5 mLs by mouth every 8 (eight) hours as needed for cough.     folic acid 505 MCG tablet    Commonly known as:  FOLVITE  Take 400 mcg by mouth daily.     guaiFENesin 600 MG 12 hr tablet  Commonly known as:  MUCINEX  Take 1 tablet (600 mg total) by mouth 2 (two) times daily.     HYDROcodone-acetaminophen 5-325 MG tablet  Commonly known as:  NORCO/VICODIN  Take 1 tablet by mouth every 4 (four) hours as needed for moderate pain.     ipratropium 0.02 % nebulizer solution  Commonly known as:  ATROVENT  Take 0.5 mg by nebulization every 8 (eight) hours.     ondansetron 8 MG disintegrating tablet  Commonly known as:  ZOFRAN ODT  Take 1 tablet (8 mg total) by mouth every 8 (eight) hours as needed for nausea or vomiting.     OXYGEN  Inhale 2 L into the lungs continuous.     predniSONE 5 MG tablet  Commonly known as:  DELTASONE  Take 5 mg by mouth daily.     prochlorperazine 10 MG tablet  Commonly known as:  COMPAZINE  Take 1 tablet (10 mg total) by mouth every 6 (six) hours as needed for nausea or vomiting.     simvastatin 5 MG tablet  Commonly known as:  ZOCOR  Take 10 mg by mouth at bedtime.         PHYSICAL EXAMINATION  Oncology Vitals 12/21/2015 12/07/2015  Height 165 cm -  Weight 59.875 kg -  Weight (lbs) 132 lbs -  BMI (kg/m2) 21.97 kg/m2 -  Temp 97.7 98.2  Pulse 109 100  Resp 18 22  SpO2 100 100  BSA (m2) 1.66 m2 -   BP Readings from Last 2 Encounters:  12/21/15 161/73  12/07/15 154/72    Physical Exam  Constitutional: She is oriented to person, place, and time. Vital signs are normal. She appears malnourished. She appears unhealthy. She appears cachectic.  HENT:  Head: Normocephalic and atraumatic.  Mouth/Throat: Oropharynx is clear and moist.  Eyes: Conjunctivae and EOM are normal. Pupils are equal, round, and reactive to light. Right eye exhibits no discharge. Left eye exhibits no discharge. No scleral icterus.  Neck: Normal range of motion. Neck supple. No JVD present. No tracheal deviation present. No thyromegaly present.  Cardiovascular:  Normal rate, regular rhythm, normal heart sounds and intact distal pulses.   Pulmonary/Chest: Effort normal and breath sounds normal. No respiratory distress. She has no wheezes. She has no rales. She exhibits no tenderness.  02 at 2 L nasal cannula.  Abdominal: Soft. Bowel sounds are normal. She exhibits no distension and no mass. There is no tenderness. There is no rebound and no guarding.  Musculoskeletal: Normal range of motion. She exhibits no edema or tenderness.  Lymphadenopathy:    She has no cervical adenopathy.  Neurological: She is alert and oriented to person, place, and time. Gait normal.  Skin: Skin is warm and dry. No rash noted. No erythema. No pallor.  Psychiatric: Affect normal.  LABORATORY DATA:. Appointment on 12/21/2015  Component Date Value Ref Range Status  . WBC 12/21/2015 5.8  3.9 - 10.3 10e3/uL Final  . NEUT# 12/21/2015 3.8  1.5 - 6.5 10e3/uL Final  . HGB 12/21/2015 11.7  11.6 - 15.9 g/dL Final  . HCT 12/21/2015 35.2  34.8 - 46.6 % Final  . Platelets 12/21/2015 304  145 - 400 10e3/uL Final  . MCV 12/21/2015 81.1  79.5 - 101.0 fL Final  . MCH 12/21/2015 26.9  25.1 - 34.0 pg Final  . MCHC 12/21/2015 33.1  31.5 - 36.0 g/dL Final  . RBC 12/21/2015 4.34  3.70 - 5.45 10e6/uL Final  . RDW 12/21/2015 14.4  11.2 - 14.5 % Final  . lymph# 12/21/2015 1.1  0.9 - 3.3 10e3/uL Final  . MONO# 12/21/2015 0.7  0.1 - 0.9 10e3/uL Final  . Eosinophils Absolute 12/21/2015 0.2  0.0 - 0.5 10e3/uL Final  . Basophils Absolute 12/21/2015 0.0  0.0 - 0.1 10e3/uL Final  . NEUT% 12/21/2015 65.4  38.4 - 76.8 % Final  . LYMPH% 12/21/2015 18.5  14.0 - 49.7 % Final  . MONO% 12/21/2015 12.4  0.0 - 14.0 % Final  . EOS% 12/21/2015 3.0  0.0 - 7.0 % Final  . BASO% 12/21/2015 0.7  0.0 - 2.0 % Final  . Sodium 12/21/2015 138  136 - 145 mEq/L Final  . Potassium 12/21/2015 3.8  3.5 - 5.1 mEq/L Final  . Chloride 12/21/2015 100  98 - 109 mEq/L Final  . CO2 12/21/2015 27  22 - 29 mEq/L Final  .  Glucose 12/21/2015 107  70 - 140 mg/dl Final   Glucose reference range is for nonfasting patients. Fasting glucose reference range is 70- 100.  Marland Kitchen BUN 12/21/2015 13.9  7.0 - 26.0 mg/dL Final  . Creatinine 12/21/2015 0.9  0.6 - 1.1 mg/dL Final  . Total Bilirubin 12/21/2015 0.51  0.20 - 1.20 mg/dL Final  . Alkaline Phosphatase 12/21/2015 67  40 - 150 U/L Final  . AST 12/21/2015 16  5 - 34 U/L Final  . ALT 12/21/2015 <9 Repeated and Verified  0 - 55 U/L Final  . Total Protein 12/21/2015 7.8  6.4 - 8.3 g/dL Final  . Albumin 12/21/2015 3.5  3.5 - 5.0 g/dL Final  . Calcium 12/21/2015 9.7  8.4 - 10.4 mg/dL Final  . Anion Gap 12/21/2015 11  3 - 11 mEq/L Final  . EGFR 12/21/2015 70* >90 ml/min/1.73 m2 Final   eGFR is calculated using the CKD-EPI Creatinine Equation (2009)     RADIOGRAPHIC STUDIES: No results found.  ASSESSMENT/PLAN:    Primary cancer of right upper lobe of lung [facility]) Patient presents to the [facility] today to receive cycle 19 of her Opdivo immunotherapy.  Patient states she's been tolerating the immunotherapy treatment fairly well; with her major complaints being mild, generalized fatigue and some occasional nausea.  She has not vomited.  She denies any other GI issues.  She states that her appetite is good.  She denies any recent fevers or chills.  Dr. Julien Nordmann in to see patient as well.  He states that he has followed up with Dr. Pablo Ledger radiation oncologist; to discuss option of radiation therapy to the esophageal area.  Unfortunately, Dr. Pablo Ledger feels that patient is not a candidate for any further radiation to this area.  The patient is scheduled to return on 01/04/2016 for labs, physical, and her next immunotherapy infusion.  Following this next cycle of her immunotherapy; patient will then need  to be scheduled for restaging scan.  Nausea without vomiting Patient reports some chronic, mild nausea; but no vomiting.  She states that she has anti--nausea medications at  home she takes on an as-needed basis.  Fatigue Patient reports some chronic issues with fatigue; but states that she remains active in her home.  She states that she is still able to clean her house, wash her dishes, and Cook as she did before.  Chronic pain Patient states that she has suffered with chronic low back pain for quite some time.  Patient states that she has been taking the hydrocodone 5/325 only 1 pill twice per day.  She states that this is only barely managing her chronic low back pain; but she is afraid that she will run out of her medications-so is using the medication very sparingly.  Patient states that her chronic low back pain is stable.  She denies any worsening pain.  She denies any known injury or trauma to the area recently.  She denies any UTI symptoms, no numbness, and no weakness.  She also denies any urine or stool incontinence.  Long discussion with the patient regarding the need to most likely take her pain medication slightly more frequently.  Gave patient a prescription refill of the hydrocodone and increase the number of pills per the prescription from 30 up to 60.  Patient was advised to continue taking the hydrocodone 1 tablet every 4-6 hours as needed.  Patient stated understanding of all instructions; and was in agreement with this plan of care. The patient knows to call the clinic with any problems, questions or concerns.   This was a shared visit with Dr. Julien Nordmann today.   Total time spent with patient was 25 minutes;  with greater than 75 percent of that time spent in face to face counseling regarding patient's symptoms,  and coordination of care and follow up.  Disclaimer:This dictation was prepared with Dragon/digital dictation along with [employer]. Any transcriptional errors that result from this process are unintentional.  Drue Second, NP 12/21/2015   ADDENDUM: Hematology/Oncology Attending: I had a face to face encounter with the  patient today. I recommended her care plan. She is a very pleasant 75 years old African-American female with metastatic non-small cell lung cancer, squamous cell carcinoma who is currently undergoing treatment with immunotherapy with Nivolumab status post 18 cycles. She has been tolerating her treatment fairly well with no significant adverse effects. The patient had enlarged upper right paratracheal lymph node and I discussed with Dr. Pablo Ledger consideration of palliative radiotherapy to this area but unfortunately it was on the previous report of radiotherapy and the patient is not candidate for any further treatment to that area of the lung. I discussed this with the patient today. I recommended for her to continue her current treatment with immunotherapy for now. She will receive cycle #19 today. The patient would come back for follow-up visit in 2 weeks for reevaluation before starting cycle #20. She was advised to call immediately if she has any concerning symptoms in the interval.  Disclaimer: This note was dictated with voice recognition software. Similar sounding words can inadvertently be transcribed and may be missed upon review. Eilleen Kempf., MD 12/21/2015

## 2015-12-21 NOTE — Assessment & Plan Note (Signed)
Patient reports some chronic issues with fatigue; but states that she remains active in her home.  She states that she is still able to clean her house, wash her dishes, and Cook as she did before.

## 2015-12-21 NOTE — Assessment & Plan Note (Signed)
Patient reports some chronic, mild nausea; but no vomiting.  She states that she has anti--nausea medications at home she takes on an as-needed basis.

## 2015-12-21 NOTE — Assessment & Plan Note (Signed)
Patient states that she has suffered with chronic low back pain for quite some time.  Patient states that she has been taking the hydrocodone 5/325 only 1 pill twice per day.  She states that this is only barely managing her chronic low back pain; but she is afraid that she will run out of her medications-so is using the medication very sparingly.  Patient states that her chronic low back pain is stable.  She denies any worsening pain.  She denies any known injury or trauma to the area recently.  She denies any UTI symptoms, no numbness, and no weakness.  She also denies any urine or stool incontinence.  Long discussion with the patient regarding the need to most likely take her pain medication slightly more frequently.  Gave patient a prescription refill of the hydrocodone and increase the number of pills per the prescription from 30 up to 60.  Patient was advised to continue taking the hydrocodone 1 tablet every 4-6 hours as needed.

## 2015-12-27 ENCOUNTER — Other Ambulatory Visit: Payer: Self-pay | Admitting: Internal Medicine

## 2015-12-27 ENCOUNTER — Encounter: Payer: Self-pay | Admitting: *Deleted

## 2015-12-27 DIAGNOSIS — J449 Chronic obstructive pulmonary disease, unspecified: Secondary | ICD-10-CM

## 2016-01-04 ENCOUNTER — Telehealth: Payer: Self-pay | Admitting: *Deleted

## 2016-01-04 ENCOUNTER — Other Ambulatory Visit (HOSPITAL_BASED_OUTPATIENT_CLINIC_OR_DEPARTMENT_OTHER): Payer: Medicare Other

## 2016-01-04 ENCOUNTER — Ambulatory Visit (HOSPITAL_BASED_OUTPATIENT_CLINIC_OR_DEPARTMENT_OTHER): Payer: Medicare Other | Admitting: Internal Medicine

## 2016-01-04 ENCOUNTER — Ambulatory Visit (HOSPITAL_BASED_OUTPATIENT_CLINIC_OR_DEPARTMENT_OTHER): Payer: Medicare Other

## 2016-01-04 ENCOUNTER — Encounter: Payer: Self-pay | Admitting: Internal Medicine

## 2016-01-04 ENCOUNTER — Telehealth: Payer: Self-pay | Admitting: Internal Medicine

## 2016-01-04 VITALS — BP 148/75 | HR 112 | Temp 98.8°F | Resp 18 | Ht 65.0 in | Wt 130.6 lb

## 2016-01-04 DIAGNOSIS — R053 Chronic cough: Secondary | ICD-10-CM

## 2016-01-04 DIAGNOSIS — Z5112 Encounter for antineoplastic immunotherapy: Secondary | ICD-10-CM | POA: Diagnosis present

## 2016-01-04 DIAGNOSIS — R0609 Other forms of dyspnea: Secondary | ICD-10-CM | POA: Diagnosis not present

## 2016-01-04 DIAGNOSIS — R1319 Other dysphagia: Secondary | ICD-10-CM

## 2016-01-04 DIAGNOSIS — C3411 Malignant neoplasm of upper lobe, right bronchus or lung: Secondary | ICD-10-CM

## 2016-01-04 DIAGNOSIS — R52 Pain, unspecified: Secondary | ICD-10-CM

## 2016-01-04 DIAGNOSIS — C3412 Malignant neoplasm of upper lobe, left bronchus or lung: Secondary | ICD-10-CM

## 2016-01-04 DIAGNOSIS — R5382 Chronic fatigue, unspecified: Secondary | ICD-10-CM

## 2016-01-04 DIAGNOSIS — R05 Cough: Secondary | ICD-10-CM

## 2016-01-04 LAB — CBC WITH DIFFERENTIAL/PLATELET
BASO%: 0.6 % (ref 0.0–2.0)
BASOS ABS: 0 10*3/uL (ref 0.0–0.1)
EOS%: 3.1 % (ref 0.0–7.0)
Eosinophils Absolute: 0.2 10*3/uL (ref 0.0–0.5)
HEMATOCRIT: 35.9 % (ref 34.8–46.6)
HEMOGLOBIN: 11.7 g/dL (ref 11.6–15.9)
LYMPH#: 1.5 10*3/uL (ref 0.9–3.3)
LYMPH%: 22.6 % (ref 14.0–49.7)
MCH: 26.2 pg (ref 25.1–34.0)
MCHC: 32.5 g/dL (ref 31.5–36.0)
MCV: 80.4 fL (ref 79.5–101.0)
MONO#: 0.9 10*3/uL (ref 0.1–0.9)
MONO%: 13.6 % (ref 0.0–14.0)
NEUT#: 4 10*3/uL (ref 1.5–6.5)
NEUT%: 60.1 % (ref 38.4–76.8)
Platelets: 383 10*3/uL (ref 145–400)
RBC: 4.47 10*6/uL (ref 3.70–5.45)
RDW: 14.2 % (ref 11.2–14.5)
WBC: 6.7 10*3/uL (ref 3.9–10.3)

## 2016-01-04 LAB — COMPREHENSIVE METABOLIC PANEL
ALBUMIN: 3.6 g/dL (ref 3.5–5.0)
ALK PHOS: 65 U/L (ref 40–150)
ALT: 12 U/L (ref 0–55)
ANION GAP: 10 meq/L (ref 3–11)
AST: 15 U/L (ref 5–34)
BILIRUBIN TOTAL: 0.59 mg/dL (ref 0.20–1.20)
BUN: 11.1 mg/dL (ref 7.0–26.0)
CALCIUM: 9.9 mg/dL (ref 8.4–10.4)
CO2: 29 meq/L (ref 22–29)
Chloride: 102 mEq/L (ref 98–109)
Creatinine: 0.9 mg/dL (ref 0.6–1.1)
EGFR: 73 mL/min/{1.73_m2} — AB (ref >90)
Glucose: 92 mg/dl (ref 70–140)
Potassium: 3.7 mEq/L (ref 3.5–5.1)
Sodium: 140 mEq/L (ref 136–145)
TOTAL PROTEIN: 8 g/dL (ref 6.4–8.3)

## 2016-01-04 MED ORDER — SODIUM CHLORIDE 0.9 % IV SOLN
240.0000 mg | Freq: Once | INTRAVENOUS | Status: AC
  Administered 2016-01-04: 240 mg via INTRAVENOUS
  Filled 2016-01-04: qty 20

## 2016-01-04 MED ORDER — SODIUM CHLORIDE 0.9 % IJ SOLN
10.0000 mL | INTRAMUSCULAR | Status: DC | PRN
  Administered 2016-01-04: 10 mL
  Filled 2016-01-04: qty 10

## 2016-01-04 MED ORDER — SODIUM CHLORIDE 0.9 % IV SOLN
Freq: Once | INTRAVENOUS | Status: AC
  Administered 2016-01-04: 13:00:00 via INTRAVENOUS

## 2016-01-04 MED ORDER — HEPARIN SOD (PORK) LOCK FLUSH 100 UNIT/ML IV SOLN
500.0000 [IU] | Freq: Once | INTRAVENOUS | Status: AC | PRN
  Administered 2016-01-04: 500 [IU]
  Filled 2016-01-04: qty 5

## 2016-01-04 NOTE — Progress Notes (Signed)
[facility] Telephone:(336) [phone]   Fax:(336) [place], [place], [address] [place] [address]  DIAGNOSIS: Metastatic non-small cell lung cancer, squamous cell carcinoma diagnosed in March of 2013.   PRIOR THERAPY:  1. Status post palliative radiotherapy to the left lung mass under the care of Dr. Pablo Ledger completed on 03/06/2012.  2. Systemic chemotherapy with carboplatin for AUC of 6 on day 1 and Abraxane 100 mg/M2 on days 1, 8 and 15 every 3 weeks. Status post 2 cycles. From cycle 3 forward AUC will be decreased to 4.5 given on day 1 and the Abraxane will be decreased to 90 mg per meter squared on days 1, 8 and 15 every 3 weeks, Status post a total of 3 cycles.  3. Systemic chemotherapy with carboplatin for AUC of 5 on day 1 and gemcitabine 1000 mg/m2 given on day 1 and day 8 every 3 weeks,status post 1 cycle. Due to significant neutropenia she will be dosed reduced beginning cycle 2 forward to carboplatin at an AUC of 4 given on day 1 and gemcitabine at 800 mg per meter squared given on days 1 and 8 every 3 weeks. Status post 6 cycles.  4. Status post palliative radiotherapy to the right hilum under the care of Dr. Pablo Ledger completed on 11/26/2013.  CURRENT THERAPY: Immunotherapy with Nivolumab 240 MG every 2 weeks. First dose expected 03/07/2015. Status post 19 cycles.  CHEMOTHERAPY INTENT: Palliative  CURRENT # OF CHEMOTHERAPY CYCLES: 20  CURRENT ANTIEMETICS: Compazine  CURRENT SMOKING STATUS: Former smoker  ORAL CHEMOTHERAPY AND CONSENT: None  CURRENT BISPHOSPHONATES USE: None  PAIN MANAGEMENT: 5/10 right shoulder currently on Norco  NARCOTICS INDUCED CONSTIPATION: None  LIVING WILL AND CODE STATUS: No CODE BLUE   INTERVAL HISTORY: Destiny Harrison 75 y.o. female returns to the clinic today for followup visit accompanied by her daughter-in-law. The patient is currently on immunotherapy with  Nivolumab every 2 weeks and tolerating it fairly well. She continues to have dry cough. She lost 3 pounds since her last visit because of the cough. Her swallowing is better. She has no chest pain, but has shortness of breath increased with exertion and cough with no hemoptysis. The patient denied having any significant fever or chills, nausea or vomiting. She is here today to start cycle #20 of her immunotherapy.  MEDICAL HISTORY: Past Medical History  Diagnosis Date  . COPD (chronic obstructive pulmonary disease) ([place])   . Neutropenia, drug-induced ([place]) 05/05/2012  . Hyperlipidemia   . Rheumatoid arthritis(714.0)   . Asthma   . Hiatal hernia   . History of chemotherapy   . History of radiation therapy 03/06/2012    left hilum  . History of radiation therapy 05/10/2013-05/31/2013    Left lung/ 33/75'@2'$ .25 per fraction x 15 fractions  . Radiation 11/15/13-11/26/13    Right hilum 30 Gy in 10 fractions  . Non-small cell lung cancer ([place]) dx'd 08/28/11    left lung  . Encounter for antineoplastic immunotherapy 05/22/2015  . DNR (do not resuscitate) 06/26/2015  . Primary cancer of right upper lobe of lung ([place]) 02/06/2012  . Dysphagia 12/07/2015    ALLERGIES:  is allergic to shellfish allergy.  MEDICATIONS:  Current Outpatient Prescriptions  Medication Sig Dispense Refill  . albuterol (PROAIR HFA) 108 (90 BASE) MCG/ACT inhaler Inhale 2 puffs into the lungs every 6 (six) hours as needed for wheezing or shortness of breath. 1 Inhaler 5  . albuterol (  PROVENTIL) (2.5 MG/3ML) 0.083% nebulizer solution Take 2.5 mg by nebulization every 8 (eight) hours.    . budesonide (PULMICORT) 0.25 MG/2ML nebulizer solution Take 2 mLs (0.25 mg total) by nebulization 2 (two) times daily. 60 mL 12  . calcium-vitamin D (OSCAL WITH D) 500-200 MG-UNIT per tablet Take 1 tablet by mouth daily.    . chlorpheniramine-HYDROcodone (TUSSIONEX) 10-8 MG/5ML SUER Take 5 mLs by mouth every 8 (eight) hours as needed for cough. 474 mL 0   . folic acid (FOLVITE) 259 MCG tablet Take 400 mcg by mouth daily.    Marland Kitchen guaiFENesin (MUCINEX) 600 MG 12 hr tablet Take 1 tablet (600 mg total) by mouth 2 (two) times daily. 30 tablet 1  . HYDROcodone-acetaminophen (NORCO/VICODIN) 5-325 MG tablet Take 1 tablet by mouth every 4 (four) hours as needed for moderate pain. 60 tablet 0  . ipratropium (ATROVENT) 0.02 % nebulizer solution Take 0.5 mg by nebulization every 8 (eight) hours.    . ondansetron (ZOFRAN ODT) 8 MG disintegrating tablet Take 1 tablet (8 mg total) by mouth every 8 (eight) hours as needed for nausea or vomiting. 20 tablet 0  . OXYGEN Inhale 2 L into the lungs continuous.    . predniSONE (DELTASONE) 5 MG tablet Take 5 mg by mouth daily.    . prochlorperazine (COMPAZINE) 10 MG tablet Take 1 tablet (10 mg total) by mouth every 6 (six) hours as needed for nausea or vomiting. (Patient not taking: Reported on 09/19/2015) 30 tablet 0  . simvastatin (ZOCOR) 5 MG tablet Take 10 mg by mouth at bedtime.      No current facility-administered medications for this visit.    SURGICAL HISTORY:  Past Surgical History  Procedure Laterality Date  . Appendex  1962  . Video bronchoscopy  01/28/2012    Procedure: VIDEO BRONCHOSCOPY WITHOUT FLUORO;  Surgeon: Brand Males, MD;  [facility] END[facility]COPY;  Service: Endoscopy;  Laterality: Bilateral;  . Surgery on right wrist      REVIEW OF SYSTEMS:  A comprehensive review of systems was negative except for: Respiratory: positive for cough Gastrointestinal: positive for dysphagia   PHYSICAL EXAMINATION: General appearance: alert, cooperative and no distress Head: Normocephalic, without obvious abnormality, atraumatic Neck: no adenopathy, no JVD, supple, symmetrical, trachea midline and thyroid not enlarged, symmetric, no tenderness/mass/nodules Lymph nodes: Cervical, supraclavicular, and axillary nodes normal. Resp: clear to auscultation bilaterally Back: symmetric, no curvature. ROM normal. No  CVA tenderness. Cardio: regular rate and rhythm, S1, S2 normal, no murmur, click, rub or gallop GI: soft, non-tender; bowel sounds normal; no masses,  no organomegaly Extremities: extremities normal, atraumatic, no cyanosis or edema Neurologic: Alert and oriented X 3, normal strength and tone. Normal symmetric reflexes. Normal coordination and gait  ECOG PERFORMANCE STATUS: 1 - Symptomatic but completely ambulatory  Blood pressure 148/75, pulse 112, temperature 98.8 F (37.1 C), temperature source Oral, resp. rate 18, height '5\' 5"'$  (1.651 m), weight 130 lb 9.6 oz (59.24 kg), SpO2 97 %.  LABORATORY DATA: Lab Results  Component Value Date   WBC 6.7 01/04/2016   HGB 11.7 01/04/2016   HCT 35.9 01/04/2016   MCV 80.4 01/04/2016   PLT 383 01/04/2016      Chemistry      Component Value Date/Time   NA 140 01/04/2016 1111   NA 139 08/19/2015 0440   K 3.7 01/04/2016 1111   K 4.1 08/19/2015 0440   CL 103 08/19/2015 0440   CL 101 01/12/2013 0810   CO2 29 01/04/2016 1111  CO2 31 08/19/2015 0440   BUN 11.1 01/04/2016 1111   BUN 16 08/19/2015 0440   CREATININE 0.9 01/04/2016 1111   CREATININE 0.82 08/19/2015 0440      Component Value Date/Time   CALCIUM 9.9 01/04/2016 1111   CALCIUM 8.9 08/19/2015 0440   ALKPHOS 65 01/04/2016 1111   ALKPHOS 72 08/18/2015 0100   AST 15 01/04/2016 1111   AST 22 08/18/2015 0100   ALT 12 01/04/2016 1111   ALT 14 08/18/2015 0100   BILITOT 0.59 01/04/2016 1111   BILITOT 0.9 08/18/2015 0100       RADIOGRAPHIC STUDIES: No results found. ASSESSMENT AND PLAN: This is a very pleasant 75 years old African American female with metastatic non-small cell lung cancer, squamous cell carcinoma status post several chemotherapy regimen and and completed a course of palliative radiotherapy to the right hilum in February 2015.  She is currently on treatment with immunotherapy with Nivolumab status post 19 cycles. The patient is doing fine and tolerating her  treatment fairly well.  She has mild upper thoracic dysphagia. I discussed her case with Dr. Pablo Ledger and she indicated that the patient is not a candidate for any further radiotherapy to this area. I recommended for the patient to continue her treatment with Nivolumab as a scheduled. She will receive cycle #20 today. The patient would come back for follow-up visit in 2 weeks for reevaluation before starting cycle #21..  For the cough, she will continue on Tussinox. She was given a refill of her pain medication today. She was advised to call immediately if she has any concerning symptoms in the interval.  The patient voices understanding of current disease status and treatment options and is in agreement with the current care plan.  All questions were answered. The patient knows to call the clinic with any problems, questions or concerns. We can certainly see the patient much sooner if necessary.  Disclaimer: This note was dictated with voice recognition software. Similar sounding words can inadvertently be transcribed and may not be corrected upon review.

## 2016-01-04 NOTE — Telephone Encounter (Signed)
Per staff message and POF I have scheduled appts. Advised scheduler of appts. JMW  

## 2016-01-04 NOTE — Patient Instructions (Signed)
[facility] Discharge Instructions for Patients Receiving Chemotherapy  Today you received the following chemotherapy agents Nivolumab.  To help prevent nausea and vomiting after your treatment, we encourage you to take your nausea medication as prescribed.   If you develop nausea and vomiting that is not controlled by your nausea medication, call the clinic.   BELOW ARE SYMPTOMS THAT SHOULD BE REPORTED IMMEDIATELY:  *FEVER GREATER THAN 100.5 F  *CHILLS WITH OR WITHOUT FEVER  NAUSEA AND VOMITING THAT IS NOT CONTROLLED WITH YOUR NAUSEA MEDICATION  *UNUSUAL SHORTNESS OF BREATH  *UNUSUAL BRUISING OR BLEEDING  TENDERNESS IN MOUTH AND THROAT WITH OR WITHOUT PRESENCE OF ULCERS  *URINARY PROBLEMS  *BOWEL PROBLEMS  UNUSUAL RASH Items with * indicate a potential emergency and should be followed up as soon as possible.  Feel free to call the clinic you have any questions or concerns. The clinic phone number is [phone].  Please show the CHEMO ALERT CARD at check-in to the Emergency Department and triage nurse.   

## 2016-01-04 NOTE — Telephone Encounter (Signed)
per pof to sch pt appt-sent MW email to sch trmt-pt to get updated copy b4 leaving trmt

## 2016-01-10 DIAGNOSIS — C3411 Malignant neoplasm of upper lobe, right bronchus or lung: Secondary | ICD-10-CM | POA: Diagnosis present

## 2016-01-10 DIAGNOSIS — J80 Acute respiratory distress syndrome: Secondary | ICD-10-CM | POA: Diagnosis not present

## 2016-01-10 DIAGNOSIS — J440 Chronic obstructive pulmonary disease with acute lower respiratory infection: Secondary | ICD-10-CM | POA: Diagnosis present

## 2016-01-10 DIAGNOSIS — J441 Chronic obstructive pulmonary disease with (acute) exacerbation: Secondary | ICD-10-CM | POA: Diagnosis not present

## 2016-01-10 DIAGNOSIS — J189 Pneumonia, unspecified organism: Secondary | ICD-10-CM | POA: Diagnosis not present

## 2016-01-10 DIAGNOSIS — J9621 Acute and chronic respiratory failure with hypoxia: Secondary | ICD-10-CM | POA: Diagnosis not present

## 2016-01-10 DIAGNOSIS — Z66 Do not resuscitate: Secondary | ICD-10-CM | POA: Diagnosis present

## 2016-01-10 DIAGNOSIS — A419 Sepsis, unspecified organism: Secondary | ICD-10-CM | POA: Diagnosis not present

## 2016-01-10 DIAGNOSIS — J90 Pleural effusion, not elsewhere classified: Secondary | ICD-10-CM | POA: Diagnosis not present

## 2016-01-10 DIAGNOSIS — R0602 Shortness of breath: Secondary | ICD-10-CM | POA: Diagnosis not present

## 2016-01-10 DIAGNOSIS — J9601 Acute respiratory failure with hypoxia: Secondary | ICD-10-CM | POA: Diagnosis not present

## 2016-01-10 DIAGNOSIS — R0682 Tachypnea, not elsewhere classified: Secondary | ICD-10-CM | POA: Diagnosis not present

## 2016-01-10 DIAGNOSIS — Z9981 Dependence on supplemental oxygen: Secondary | ICD-10-CM | POA: Diagnosis not present

## 2016-01-10 DIAGNOSIS — N3000 Acute cystitis without hematuria: Secondary | ICD-10-CM | POA: Diagnosis not present

## 2016-01-10 DIAGNOSIS — B9620 Unspecified Escherichia coli [E. coli] as the cause of diseases classified elsewhere: Secondary | ICD-10-CM | POA: Diagnosis present

## 2016-01-10 DIAGNOSIS — M069 Rheumatoid arthritis, unspecified: Secondary | ICD-10-CM | POA: Diagnosis not present

## 2016-01-10 DIAGNOSIS — Z780 Asymptomatic menopausal state: Secondary | ICD-10-CM | POA: Diagnosis not present

## 2016-01-10 DIAGNOSIS — R069 Unspecified abnormalities of breathing: Secondary | ICD-10-CM | POA: Diagnosis not present

## 2016-01-10 DIAGNOSIS — C3490 Malignant neoplasm of unspecified part of unspecified bronchus or lung: Secondary | ICD-10-CM | POA: Diagnosis not present

## 2016-01-10 DIAGNOSIS — R0600 Dyspnea, unspecified: Secondary | ICD-10-CM | POA: Diagnosis not present

## 2016-01-10 DIAGNOSIS — C3491 Malignant neoplasm of unspecified part of right bronchus or lung: Secondary | ICD-10-CM | POA: Diagnosis not present

## 2016-01-10 DIAGNOSIS — Z87891 Personal history of nicotine dependence: Secondary | ICD-10-CM | POA: Diagnosis not present

## 2016-01-10 DIAGNOSIS — Z91013 Allergy to seafood: Secondary | ICD-10-CM | POA: Diagnosis not present

## 2016-01-10 DIAGNOSIS — J9620 Acute and chronic respiratory failure, unspecified whether with hypoxia or hypercapnia: Secondary | ICD-10-CM | POA: Diagnosis not present

## 2016-01-10 DIAGNOSIS — C771 Secondary and unspecified malignant neoplasm of intrathoracic lymph nodes: Secondary | ICD-10-CM | POA: Diagnosis present

## 2016-01-10 DIAGNOSIS — Z4682 Encounter for fitting and adjustment of non-vascular catheter: Secondary | ICD-10-CM | POA: Diagnosis not present

## 2016-01-10 DIAGNOSIS — R000 Tachycardia, unspecified: Secondary | ICD-10-CM | POA: Diagnosis not present

## 2016-01-10 DIAGNOSIS — N390 Urinary tract infection, site not specified: Secondary | ICD-10-CM | POA: Diagnosis not present

## 2016-01-17 ENCOUNTER — Telehealth: Payer: Self-pay | Admitting: Medical Oncology

## 2016-01-17 NOTE — Telephone Encounter (Signed)
Pt admitted to [facility]- 3/22/-3/28 for "hypoxia,infection in blood stream UTI, Chronic bronchitis". I told pt to keep appt for tomorrow.

## 2016-01-18 ENCOUNTER — Ambulatory Visit: Payer: Medicare Other

## 2016-01-18 ENCOUNTER — Other Ambulatory Visit (HOSPITAL_BASED_OUTPATIENT_CLINIC_OR_DEPARTMENT_OTHER): Payer: Medicare Other

## 2016-01-18 ENCOUNTER — Encounter: Payer: Self-pay | Admitting: Internal Medicine

## 2016-01-18 ENCOUNTER — Telehealth: Payer: Self-pay | Admitting: Internal Medicine

## 2016-01-18 ENCOUNTER — Ambulatory Visit (HOSPITAL_BASED_OUTPATIENT_CLINIC_OR_DEPARTMENT_OTHER): Payer: Medicare Other | Admitting: Internal Medicine

## 2016-01-18 VITALS — BP 145/66 | HR 118 | Temp 98.1°F | Resp 17 | Ht 65.0 in | Wt 127.5 lb

## 2016-01-18 DIAGNOSIS — M25511 Pain in right shoulder: Secondary | ICD-10-CM

## 2016-01-18 DIAGNOSIS — C3411 Malignant neoplasm of upper lobe, right bronchus or lung: Secondary | ICD-10-CM

## 2016-01-18 DIAGNOSIS — R5382 Chronic fatigue, unspecified: Secondary | ICD-10-CM

## 2016-01-18 DIAGNOSIS — J449 Chronic obstructive pulmonary disease, unspecified: Secondary | ICD-10-CM | POA: Diagnosis not present

## 2016-01-18 DIAGNOSIS — R05 Cough: Secondary | ICD-10-CM

## 2016-01-18 DIAGNOSIS — R0609 Other forms of dyspnea: Secondary | ICD-10-CM | POA: Diagnosis not present

## 2016-01-18 DIAGNOSIS — R1310 Dysphagia, unspecified: Secondary | ICD-10-CM

## 2016-01-18 DIAGNOSIS — Z5112 Encounter for antineoplastic immunotherapy: Secondary | ICD-10-CM

## 2016-01-18 LAB — CBC WITH DIFFERENTIAL/PLATELET
BASO%: 0 % (ref 0.0–2.0)
BASOS ABS: 0 10*3/uL (ref 0.0–0.1)
EOS%: 1 % (ref 0.0–7.0)
Eosinophils Absolute: 0.1 10*3/uL (ref 0.0–0.5)
HEMATOCRIT: 36.3 % (ref 34.8–46.6)
HGB: 11.9 g/dL (ref 11.6–15.9)
LYMPH#: 1.1 10*3/uL (ref 0.9–3.3)
LYMPH%: 12.9 % — AB (ref 14.0–49.7)
MCH: 26.3 pg (ref 25.1–34.0)
MCHC: 32.8 g/dL (ref 31.5–36.0)
MCV: 80.1 fL (ref 79.5–101.0)
MONO#: 1.5 10*3/uL — ABNORMAL HIGH (ref 0.1–0.9)
MONO%: 17.5 % — ABNORMAL HIGH (ref 0.0–14.0)
NEUT#: 5.9 10*3/uL (ref 1.5–6.5)
NEUT%: 68.6 % (ref 38.4–76.8)
PLATELETS: 329 10*3/uL (ref 145–400)
RBC: 4.53 10*6/uL (ref 3.70–5.45)
RDW: 14.3 % (ref 11.2–14.5)
WBC: 8.7 10*3/uL (ref 3.9–10.3)

## 2016-01-18 LAB — COMPREHENSIVE METABOLIC PANEL
ALT: 29 U/L (ref 0–55)
ANION GAP: 8 meq/L (ref 3–11)
AST: 19 U/L (ref 5–34)
Albumin: 3.3 g/dL — ABNORMAL LOW (ref 3.5–5.0)
Alkaline Phosphatase: 59 U/L (ref 40–150)
BUN: 18.5 mg/dL (ref 7.0–26.0)
CALCIUM: 9.4 mg/dL (ref 8.4–10.4)
CHLORIDE: 97 meq/L — AB (ref 98–109)
CO2: 33 meq/L — AB (ref 22–29)
Creatinine: 1 mg/dL (ref 0.6–1.1)
EGFR: 66 mL/min/{1.73_m2} — ABNORMAL LOW (ref >90)
Glucose: 163 mg/dl — ABNORMAL HIGH (ref 70–140)
Potassium: 3.8 mEq/L (ref 3.5–5.1)
Sodium: 139 mEq/L (ref 136–145)
Total Bilirubin: 0.6 mg/dL (ref 0.20–1.20)
Total Protein: 6.6 g/dL (ref 6.4–8.3)

## 2016-01-18 NOTE — Telephone Encounter (Signed)
Gave and printed appt sched and avs for pt for April adn may

## 2016-01-18 NOTE — Progress Notes (Signed)
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   PRIOR THERAPY:  1. Status post palliative radiotherapy to the left lung mass under the care of Dr. Pablo Ledger completed on 03/06/2012.  2. Systemic chemotherapy with carboplatin for AUC of 6 on day 1 and Abraxane 100 mg/M2 on days 1, 8 and 15 every 3 weeks. Status post 2 cycles. From cycle 3 forward AUC will be decreased to 4.5 given on day 1 and the Abraxane will be decreased to 90 mg per meter squared on days 1, 8 and 15 every 3 weeks, Status post a total of 3 cycles.  3. Systemic chemotherapy with carboplatin for AUC of 5 on day 1 and gemcitabine 1000 mg/m2 given on day 1 and day 8 every 3 weeks,status post 1 cycle. Due to significant neutropenia she will be dosed reduced beginning cycle 2 forward to carboplatin at an AUC of 4 given on day 1 and gemcitabine at 800 mg per meter squared given on days 1 and 8 every 3 weeks. Status post 6 cycles.  4. Status post palliative radiotherapy to the right hilum under the care of Dr. Pablo Ledger completed on 11/26/2013.  CURRENT THERAPY: Immunotherapy with Nivolumab 240 MG every 2 weeks. First dose expected 03/07/2015. Status post 20 cycles.  CHEMOTHERAPY INTENT: Palliative  CURRENT # OF CHEMOTHERAPY CYCLES: 21  CURRENT ANTIEMETICS: Compazine  CURRENT SMOKING STATUS: Former smoker  ORAL CHEMOTHERAPY AND CONSENT: None  CURRENT BISPHOSPHONATES USE: None  PAIN MANAGEMENT: 5/10 right shoulder currently on Norco  NARCOTICS INDUCED CONSTIPATION: None  LIVING WILL AND CODE STATUS: No CODE BLUE   INTERVAL HISTORY: Destiny Harrison 75 y.o. female returns to the clinic today for followup visit accompanied by her son. The patient was recently admitted to [facility]  with acute respiratory distress and was found to have questionable pneumonia with COPD exacerbation. She was started on a course of antibiotics as well as taper dose of prednisone. She is feeling a little bit better but not completely recovered from her breathing problem. Her oxygen saturation today is 99% on 2 L nasal cannula. She is currently on immunotherapy with Nivolumab every 2 weeks and tolerating it fairly well. She continues to have dysphagia but it is a little bit better. She has no chest pain, but has shortness of breath increased with exertion and cough with no hemoptysis. The patient denied having any significant fever or chills, nausea or vomiting. She was supposed to start cycle #21 of treatment today.  MEDICAL HISTORY: Past Medical History  Diagnosis Date  . COPD (chronic obstructive pulmonary disease) ([place])   . Neutropenia, drug-induced ([place]) 05/05/2012  . Hyperlipidemia   . Rheumatoid arthritis(714.0)   . Asthma   . Hiatal hernia   . History of chemotherapy   . History of radiation therapy 03/06/2012    left hilum  . History of radiation therapy 05/10/2013-05/31/2013    Left lung/ 33/75'@2'$ .25 per fraction x 15 fractions  . Radiation 11/15/13-11/26/13    Right hilum 30 Gy in 10 fractions  . Non-small cell lung cancer ([place]) dx'd 08/28/11    left lung  . Encounter for antineoplastic immunotherapy 05/22/2015  . DNR (do not resuscitate) 06/26/2015  . Primary cancer of right upper lobe of lung ([place]) 02/06/2012  . Dysphagia 12/07/2015  ALLERGIES:  is allergic to shellfish allergy.  MEDICATIONS:  Current Outpatient Prescriptions  Medication Sig Dispense Refill  . albuterol (PROAIR HFA) 108 (90 BASE) MCG/ACT inhaler Inhale 2 puffs into the lungs every 6 (six) hours as needed for wheezing or shortness of breath. 1 Inhaler 5  . albuterol (PROVENTIL) (2.5 MG/3ML) 0.083% nebulizer solution Take 2.5 mg by nebulization every 8 (eight) hours.    Marland Kitchen azithromycin (ZITHROMAX) 500 MG tablet Take 500  mg by mouth daily.    . benzonatate (TESSALON) 100 MG capsule Take 100 mg by mouth every 8 (eight) hours as needed.  1  . budesonide (PULMICORT) 0.25 MG/2ML nebulizer solution Take 2 mLs (0.25 mg total) by nebulization 2 (two) times daily. 60 mL 12  . calcium-vitamin D (OSCAL WITH D) 500-200 MG-UNIT per tablet Take 1 tablet by mouth daily.    . cefdinir (OMNICEF) 300 MG capsule Take 300 mg by mouth 2 (two) times daily.    . chlorpheniramine-HYDROcodone (TUSSIONEX) 10-8 MG/5ML SUER Take 5 mLs by mouth every 8 (eight) hours as needed for cough. 140 mL 0  . diltiazem (CARDIZEM CD) 180 MG 24 hr capsule Take 180 mg by mouth daily.    . folic acid (FOLVITE) 299 MCG tablet Take 400 mcg by mouth daily.    Marland Kitchen guaiFENesin (MUCINEX) 600 MG 12 hr tablet Take 1 tablet (600 mg total) by mouth 2 (two) times daily. 30 tablet 1  . HYDROcodone-acetaminophen (NORCO/VICODIN) 5-325 MG tablet Take 1 tablet by mouth every 4 (four) hours as needed for moderate pain. 60 tablet 0  . ipratropium (ATROVENT) 0.02 % nebulizer solution Take 0.5 mg by nebulization every 8 (eight) hours.    Marland Kitchen morphine (MSIR) 15 MG tablet Take 15 mg by mouth. Every 4 hours prn    . ondansetron (ZOFRAN) 8 MG tablet Take 8 mg by mouth every 8 (eight) hours as needed.    . OXYGEN Inhale 2 L into the lungs continuous.    . pantoprazole (PROTONIX) 40 MG tablet Take 40 mg by mouth daily.    . predniSONE (DELTASONE) 10 MG tablet Take 10 mg by mouth daily. Reported on 01/18/2016    . prochlorperazine (COMPAZINE) 10 MG tablet Take 1 tablet (10 mg total) by mouth every 6 (six) hours as needed for nausea or vomiting. 30 tablet 0  . simvastatin (ZOCOR) 5 MG tablet Take 10 mg by mouth at bedtime.     . ondansetron (ZOFRAN ODT) 8 MG disintegrating tablet Take 1 tablet (8 mg total) by mouth every 8 (eight) hours as needed for nausea or vomiting. (Patient not taking: Reported on 01/18/2016) 20 tablet 0  . predniSONE (DELTASONE) 5 MG tablet Take 5 mg by mouth daily.  Reported on 01/18/2016     No current facility-administered medications for this visit.    SURGICAL HISTORY:  Past Surgical History  Procedure Laterality Date  . Appendex  1962  . Video bronchoscopy  01/28/2012    Procedure: VIDEO BRONCHOSCOPY WITHOUT FLUORO;  Surgeon: Brand Males, MD;  Location: [facility];  Service: Endoscopy;  Laterality: Bilateral;  . Surgery on right wrist      REVIEW OF SYSTEMS:  A comprehensive review of systems was negative except for: Respiratory: positive for cough and dyspnea on exertion Gastrointestinal: positive for dysphagia   PHYSICAL EXAMINATION: General appearance: alert, cooperative and no distress Head: Normocephalic, without obvious abnormality, atraumatic Neck: no adenopathy, no JVD, supple, symmetrical, trachea midline and thyroid not enlarged, symmetric, no tenderness/mass/nodules Lymph nodes: Cervical,  supraclavicular, and axillary nodes normal. Resp: clear to auscultation bilaterally Back: symmetric, no curvature. ROM normal. No CVA tenderness. Cardio: regular rate and rhythm, S1, S2 normal, no murmur, click, rub or gallop GI: soft, non-tender; bowel sounds normal; no masses,  no organomegaly Extremities: extremities normal, atraumatic, no cyanosis or edema Neurologic: Alert and oriented X 3, normal strength and tone. Normal symmetric reflexes. Normal coordination and gait  ECOG PERFORMANCE STATUS: 1 - Symptomatic but completely ambulatory  Blood pressure 145/66, pulse 118, temperature 98.1 F (36.7 C), temperature source Oral, resp. rate 17, height '5\' 5"'$  (1.651 m), weight 127 lb 8 oz (57.834 kg), SpO2 99 %.  LABORATORY DATA: Lab Results  Component Value Date   WBC 8.7 01/18/2016   HGB 11.9 01/18/2016   HCT 36.3 01/18/2016   MCV 80.1 01/18/2016   PLT 329 01/18/2016      Chemistry      Component Value Date/Time   NA 139 01/18/2016 0954   NA 139 08/19/2015 0440   K 3.8 01/18/2016 0954   K 4.1 08/19/2015 0440   CL 103  08/19/2015 0440   CL 101 01/12/2013 0810   CO2 33* 01/18/2016 0954   CO2 31 08/19/2015 0440   BUN 18.5 01/18/2016 0954   BUN 16 08/19/2015 0440   CREATININE 1.0 01/18/2016 0954   CREATININE 0.82 08/19/2015 0440      Component Value Date/Time   CALCIUM 9.4 01/18/2016 0954   CALCIUM 8.9 08/19/2015 0440   ALKPHOS 59 01/18/2016 0954   ALKPHOS 72 08/18/2015 0100   AST 19 01/18/2016 0954   AST 22 08/18/2015 0100   ALT 29 01/18/2016 0954   ALT 14 08/18/2015 0100   BILITOT 0.60 01/18/2016 0954   BILITOT 0.9 08/18/2015 0100       RADIOGRAPHIC STUDIES: No results found. ASSESSMENT AND PLAN: This is a very pleasant 75 years old African American female with metastatic non-small cell lung cancer, squamous cell carcinoma status post several chemotherapy regimen and and completed a course of palliative radiotherapy to the right hilum in February 2015.  She is currently on treatment with immunotherapy with Nivolumab status post 20 cycles. The patient is doing fine and tolerating her treatment fairly well.  She was recently treated for questionable pneumonia and COPD exacerbation. She is currently on a taper dose of prednisone. I recommended for the patient to delay the start of cycle #21 by 2 more weeks. She will come back for follow-up visit in 2 weeks for evaluation after repeating CT scan of the chest, abdomen and pelvis for restaging of her disease for starting the next dose of her treatment. For the cough, she will continue on Tussinox. She was given a refill of her pain medication today. She was advised to call immediately if she has any concerning symptoms in the interval.  The patient voices understanding of current disease status and treatment options and is in agreement with the current care plan.  All questions were answered. The patient knows to call the clinic with any problems, questions or concerns. We can certainly see the patient much sooner if necessary.  Disclaimer: This  note was dictated with voice recognition software. Similar sounding words can inadvertently be transcribed and may not be corrected upon review.

## 2016-01-30 ENCOUNTER — Other Ambulatory Visit: Payer: Medicare Other

## 2016-01-30 ENCOUNTER — Encounter (HOSPITAL_COMMUNITY): Payer: Self-pay

## 2016-01-30 ENCOUNTER — Ambulatory Visit (HOSPITAL_COMMUNITY)
Admission: RE | Admit: 2016-01-30 | Discharge: 2016-01-30 | Disposition: A | Discharge status: Home / Self Care | Payer: Medicare Other | Source: Ambulatory Visit | Attending: Internal Medicine | Admitting: Internal Medicine

## 2016-01-30 ENCOUNTER — Ambulatory Visit: Payer: Medicare Other | Admitting: Internal Medicine

## 2016-01-30 DIAGNOSIS — R591 Generalized enlarged lymph nodes: Secondary | ICD-10-CM | POA: Insufficient documentation

## 2016-01-30 DIAGNOSIS — Z923 Personal history of irradiation: Secondary | ICD-10-CM | POA: Diagnosis not present

## 2016-01-30 DIAGNOSIS — C3411 Malignant neoplasm of upper lobe, right bronchus or lung: Secondary | ICD-10-CM | POA: Diagnosis not present

## 2016-01-30 DIAGNOSIS — R918 Other nonspecific abnormal finding of lung field: Secondary | ICD-10-CM | POA: Insufficient documentation

## 2016-01-30 DIAGNOSIS — Z9221 Personal history of antineoplastic chemotherapy: Secondary | ICD-10-CM | POA: Diagnosis present

## 2016-01-30 DIAGNOSIS — I700 Atherosclerosis of aorta: Secondary | ICD-10-CM | POA: Insufficient documentation

## 2016-01-30 DIAGNOSIS — R5382 Chronic fatigue, unspecified: Secondary | ICD-10-CM | POA: Insufficient documentation

## 2016-01-30 DIAGNOSIS — I2510 Atherosclerotic heart disease of native coronary artery without angina pectoris: Secondary | ICD-10-CM | POA: Insufficient documentation

## 2016-01-30 DIAGNOSIS — Z5112 Encounter for antineoplastic immunotherapy: Secondary | ICD-10-CM

## 2016-01-30 DIAGNOSIS — C3490 Malignant neoplasm of unspecified part of unspecified bronchus or lung: Secondary | ICD-10-CM | POA: Diagnosis not present

## 2016-01-30 MED ORDER — IOPAMIDOL (ISOVUE-300) INJECTION 61%
100.0000 mL | Freq: Once | INTRAVENOUS | Status: AC | PRN
  Administered 2016-01-30: 100 mL via INTRAVENOUS

## 2016-02-01 ENCOUNTER — Other Ambulatory Visit (HOSPITAL_BASED_OUTPATIENT_CLINIC_OR_DEPARTMENT_OTHER): Payer: Medicare Other

## 2016-02-01 ENCOUNTER — Ambulatory Visit (HOSPITAL_BASED_OUTPATIENT_CLINIC_OR_DEPARTMENT_OTHER): Payer: Medicare Other | Admitting: Internal Medicine

## 2016-02-01 ENCOUNTER — Telehealth: Payer: Self-pay | Admitting: *Deleted

## 2016-02-01 ENCOUNTER — Ambulatory Visit: Payer: Medicare Other

## 2016-02-01 ENCOUNTER — Encounter: Payer: Self-pay | Admitting: Internal Medicine

## 2016-02-01 ENCOUNTER — Encounter: Payer: Self-pay | Admitting: *Deleted

## 2016-02-01 ENCOUNTER — Telehealth: Payer: Self-pay | Admitting: Internal Medicine

## 2016-02-01 VITALS — BP 143/54 | HR 109 | Temp 98.3°F | Resp 20 | Ht 65.0 in | Wt 123.2 lb

## 2016-02-01 DIAGNOSIS — Z79899 Other long term (current) drug therapy: Secondary | ICD-10-CM | POA: Diagnosis not present

## 2016-02-01 DIAGNOSIS — Z5112 Encounter for antineoplastic immunotherapy: Secondary | ICD-10-CM

## 2016-02-01 DIAGNOSIS — R599 Enlarged lymph nodes, unspecified: Secondary | ICD-10-CM | POA: Diagnosis not present

## 2016-02-01 DIAGNOSIS — C3411 Malignant neoplasm of upper lobe, right bronchus or lung: Secondary | ICD-10-CM | POA: Diagnosis not present

## 2016-02-01 DIAGNOSIS — R05 Cough: Secondary | ICD-10-CM

## 2016-02-01 DIAGNOSIS — R0602 Shortness of breath: Secondary | ICD-10-CM | POA: Diagnosis not present

## 2016-02-01 DIAGNOSIS — R5382 Chronic fatigue, unspecified: Secondary | ICD-10-CM

## 2016-02-01 DIAGNOSIS — C3490 Malignant neoplasm of unspecified part of unspecified bronchus or lung: Secondary | ICD-10-CM

## 2016-02-01 LAB — COMPREHENSIVE METABOLIC PANEL
ALBUMIN: 3 g/dL — AB (ref 3.5–5.0)
ALK PHOS: 85 U/L (ref 40–150)
ALT: 12 U/L (ref 0–55)
AST: 15 U/L (ref 5–34)
Anion Gap: 10 mEq/L (ref 3–11)
BUN: 7.8 mg/dL (ref 7.0–26.0)
CO2: 26 meq/L (ref 22–29)
Calcium: 9.6 mg/dL (ref 8.4–10.4)
Chloride: 100 mEq/L (ref 98–109)
Creatinine: 0.9 mg/dL (ref 0.6–1.1)
EGFR: 76 mL/min/{1.73_m2} — ABNORMAL LOW (ref >90)
GLUCOSE: 162 mg/dL — AB (ref 70–140)
POTASSIUM: 3.9 meq/L (ref 3.5–5.1)
SODIUM: 137 meq/L (ref 136–145)
TOTAL PROTEIN: 7.2 g/dL (ref 6.4–8.3)
Total Bilirubin: 0.41 mg/dL (ref 0.20–1.20)

## 2016-02-01 LAB — CBC WITH DIFFERENTIAL/PLATELET
BASO%: 0.2 % (ref 0.0–2.0)
Basophils Absolute: 0 10*3/uL (ref 0.0–0.1)
EOS%: 1.4 % (ref 0.0–7.0)
Eosinophils Absolute: 0.1 10*3/uL (ref 0.0–0.5)
HCT: 30.9 % — ABNORMAL LOW (ref 34.8–46.6)
HEMOGLOBIN: 10.5 g/dL — AB (ref 11.6–15.9)
LYMPH%: 16.3 % (ref 14.0–49.7)
MCH: 26.6 pg (ref 25.1–34.0)
MCHC: 34 g/dL (ref 31.5–36.0)
MCV: 78.2 fL — ABNORMAL LOW (ref 79.5–101.0)
MONO#: 0.3 10*3/uL (ref 0.1–0.9)
MONO%: 6.3 % (ref 0.0–14.0)
NEUT%: 75.8 % (ref 38.4–76.8)
NEUTROS ABS: 3.7 10*3/uL (ref 1.5–6.5)
Platelets: 268 10*3/uL (ref 145–400)
RBC: 3.95 10*6/uL (ref 3.70–5.45)
RDW: 14.2 % (ref 11.2–14.5)
WBC: 4.9 10*3/uL (ref 3.9–10.3)
lymph#: 0.8 10*3/uL — ABNORMAL LOW (ref 0.9–3.3)

## 2016-02-01 LAB — TSH: TSH: 0.982 m(IU)/L (ref 0.308–3.960)

## 2016-02-01 MED ORDER — MORPHINE SULFATE ER 15 MG PO TBCR: 15.0000 mg | EXTENDED_RELEASE_TABLET | Freq: Two times a day (BID) | ORAL | Status: DC

## 2016-02-01 MED ORDER — HYDROCODONE-ACETAMINOPHEN 5-325 MG PO TABS: 1.0000 | ORAL_TABLET | ORAL | Status: DC | PRN

## 2016-02-01 MED ORDER — DEXAMETHASONE 4 MG PO TABS: ORAL_TABLET | ORAL | Status: DC

## 2016-02-01 NOTE — Telephone Encounter (Signed)
Gave and printed appt Destiny Harrison will sched tx

## 2016-02-01 NOTE — Progress Notes (Signed)
[facility] [phone]   Fax:([phone]) [place], [place], [address] [place] [address]  DIAGNOSIS: Metastatic non-small cell lung cancer, squamous cell carcinoma diagnosed in March of 2013.   PRIOR THERAPY:  1. Status post palliative radiotherapy to the left lung mass under the care of Dr. Pablo Ledger completed on 03/06/2012.  2. Systemic chemotherapy with carboplatin for AUC of 6 on day 1 and Abraxane 100 mg/M2 on days 1, 8 and 15 every 3 weeks. Status post 2 cycles. From cycle 3 forward AUC will be decreased to 4.5 given on day 1 and the Abraxane will be decreased to 90 mg per meter squared on days 1, 8 and 15 every 3 weeks, Status post a total of 3 cycles.  3. Systemic chemotherapy with carboplatin for AUC of 5 on day 1 and gemcitabine 1000 mg/m2 given on day 1 and day 8 every 3 weeks,status post 1 cycle. Due to significant neutropenia she will be dosed reduced beginning cycle 2 forward to carboplatin at an AUC of 4 given on day 1 and gemcitabine at 800 mg per meter squared given on days 1 and 8 every 3 weeks. Status post 6 cycles.  4. Status post palliative radiotherapy to the right hilum under the care of Dr. Pablo Ledger completed on 11/26/2013. 5. Immunotherapy with Nivolumab 240 MG every 2 weeks. First dose expected 03/07/2015. Status post 20 cycles, last dose was given 01/18/2016 discontinued secondary to disease progression.  CURRENT THERAPY: Systemic chemotherapy with docetaxel 75 MG/M2 and Cyramza 10 mg/KG every 3 weeks. First dose 02/08/2016.  CHEMOTHERAPY INTENT: Palliative  CURRENT # OF CHEMOTHERAPY CYCLES: 1  CURRENT ANTIEMETICS: Compazine  CURRENT SMOKING STATUS: Former smoker  ORAL CHEMOTHERAPY AND CONSENT: None  CURRENT BISPHOSPHONATES USE: None  PAIN MANAGEMENT: 5/10 right shoulder currently on Norco  NARCOTICS INDUCED CONSTIPATION: None  LIVING WILL AND CODE STATUS: No CODE  BLUE   INTERVAL HISTORY: Destiny Harrison 75 y.o. female returns to the clinic today for followup visit accompanied by her son. The patient is feeling fine today except for the baseline shortness of breath and she is currently on home oxygen. She denied having any worsening of her swallowing. She denied having any significant chest pain but continues to have cough with no hemoptysis. She is currently on immunotherapy with Nivolumab every 2 weeks and tolerating it fairly well. She has no significant weight loss or night sweats. The patient denied having any significant fever or chills, nausea or vomiting. She had repeat CT scan of the chest performed recently and she is here for evaluation and discussion of her scan results.  MEDICAL HISTORY: Past Medical History  Diagnosis Date  . COPD (chronic obstructive pulmonary disease) ([place])   . Neutropenia, drug-induced ([place]) 05/05/2012  . Hyperlipidemia   . Rheumatoid arthritis(714.0)   . Asthma   . Hiatal hernia   . History of chemotherapy   . History of radiation therapy 03/06/2012    left hilum  . History of radiation therapy 05/10/2013-05/31/2013    Left lung/ 33/75'@2'$ .25 per fraction x 15 fractions  . Radiation 11/15/13-11/26/13    Right hilum 30 Gy in 10 fractions  . Non-small cell lung cancer ([place]) dx'd 08/28/11    left lung  . Encounter for antineoplastic immunotherapy 05/22/2015  . DNR (do not resuscitate) 06/26/2015  . Primary cancer of right upper lobe of lung ([place]) 02/06/2012  . Dysphagia 12/07/2015    ALLERGIES:  is  allergic to shellfish allergy.  MEDICATIONS:  Current Outpatient Prescriptions  Medication Sig Dispense Refill  . albuterol (PROAIR HFA) 108 (90 BASE) MCG/ACT inhaler Inhale 2 puffs into the lungs every 6 (six) hours as needed for wheezing or shortness of breath. 1 Inhaler 5  . albuterol (PROVENTIL) (2.5 MG/3ML) 0.083% nebulizer solution Take 2.5 mg by nebulization every 8 (eight) hours.    . benzonatate (TESSALON) 100 MG capsule Take  100 mg by mouth every 8 (eight) hours as needed.  1  . budesonide (PULMICORT) 0.25 MG/2ML nebulizer solution Take 2 mLs (0.25 mg total) by nebulization 2 (two) times daily. 60 mL 12  . calcium-vitamin D (OSCAL WITH D) 500-200 MG-UNIT per tablet Take 1 tablet by mouth daily.    Marland Kitchen diltiazem (CARDIZEM CD) 180 MG 24 hr capsule Take 180 mg by mouth daily.    . folic acid (FOLVITE) 191 MCG tablet Take 400 mcg by mouth daily.    Marland Kitchen guaiFENesin (MUCINEX) 600 MG 12 hr tablet Take 1 tablet (600 mg total) by mouth 2 (two) times daily. 30 tablet 1  . HYDROcodone-acetaminophen (NORCO/VICODIN) 5-325 MG tablet Take 1 tablet by mouth every 4 (four) hours as needed for moderate pain. 60 tablet 0  . ipratropium (ATROVENT) 0.02 % nebulizer solution Take 0.5 mg by nebulization every 8 (eight) hours.    . OXYGEN Inhale 2 L into the lungs continuous.    . pantoprazole (PROTONIX) 40 MG tablet Take 40 mg by mouth daily.    . predniSONE (DELTASONE) 5 MG tablet Take 5 mg by mouth daily. Reported on 01/18/2016    . simvastatin (ZOCOR) 5 MG tablet Take 10 mg by mouth at bedtime.     . chlorpheniramine-HYDROcodone (TUSSIONEX) 10-8 MG/5ML SUER Take 5 mLs by mouth every 8 (eight) hours as needed for cough. (Patient not taking: Reported on 02/01/2016) 140 mL 0  . morphine (MSIR) 15 MG tablet Take 15 mg by mouth. Every 4 hours prn    . ondansetron (ZOFRAN ODT) 8 MG disintegrating tablet Take 1 tablet (8 mg total) by mouth every 8 (eight) hours as needed for nausea or vomiting. (Patient not taking: Reported on 01/18/2016) 20 tablet 0  . ondansetron (ZOFRAN) 8 MG tablet Take 8 mg by mouth every 8 (eight) hours as needed. Reported on 02/01/2016    . prochlorperazine (COMPAZINE) 10 MG tablet Take 1 tablet (10 mg total) by mouth every 6 (six) hours as needed for nausea or vomiting. (Patient not taking: Reported on 02/01/2016) 30 tablet 0   No current facility-administered medications for this visit.    SURGICAL HISTORY:  Past Surgical  History  Procedure Laterality Date  . Appendex  1962  . Video bronchoscopy  01/28/2012    Procedure: VIDEO BRONCHOSCOPY WITHOUT FLUORO;  Surgeon: Brand Males, MD;  Location: [facility] ENDOSCOPY;  Service: Endoscopy;  Laterality: Bilateral;  . Surgery on right wrist      REVIEW OF SYSTEMS:  Constitutional: positive for fatigue Eyes: negative Ears, nose, mouth, throat, and face: negative Respiratory: positive for cough and dyspnea on exertion Cardiovascular: negative Gastrointestinal: negative Genitourinary:negative Integument/breast: negative Hematologic/lymphatic: negative Musculoskeletal:negative Neurological: negative Behavioral/Psych: negative Endocrine: negative Allergic/Immunologic: negative   PHYSICAL EXAMINATION: General appearance: alert, cooperative and no distress Head: Normocephalic, without obvious abnormality, atraumatic Neck: no adenopathy, no JVD, supple, symmetrical, trachea midline and thyroid not enlarged, symmetric, no tenderness/mass/nodules Lymph nodes: Cervical, supraclavicular, and axillary nodes normal. Resp: clear to auscultation bilaterally Back: symmetric, no curvature. ROM normal. No CVA tenderness. Cardio: regular  rate and rhythm, S1, S2 normal, no murmur, click, rub or gallop GI: soft, non-tender; bowel sounds normal; no masses,  no organomegaly Extremities: extremities normal, atraumatic, no cyanosis or edema Neurologic: Alert and oriented X 3, normal strength and tone. Normal symmetric reflexes. Normal coordination and gait  ECOG PERFORMANCE STATUS: 1 - Symptomatic but completely ambulatory  Blood pressure 143/54, pulse 109, temperature 98.3 F (36.8 C), temperature source Oral, resp. rate 20, height '5\' 5"'$  (1.651 m), weight 123 lb 3.2 oz (55.883 kg), SpO2 97 %.  LABORATORY DATA: Lab Results  Component Value Date   WBC 4.9 02/01/2016   HGB 10.5* 02/01/2016   HCT 30.9* 02/01/2016   MCV 78.2* 02/01/2016   PLT 268 02/01/2016      Chemistry       Component Value Date/Time   NA 137 02/01/2016 1010   NA 139 08/19/2015 0440   K 3.9 02/01/2016 1010   K 4.1 08/19/2015 0440   CL 103 08/19/2015 0440   CL 101 01/12/2013 0810   CO2 26 02/01/2016 1010   CO2 31 08/19/2015 0440   BUN 7.8 02/01/2016 1010   BUN 16 08/19/2015 0440   CREATININE 0.9 02/01/2016 1010   CREATININE 0.82 08/19/2015 0440      Component Value Date/Time   CALCIUM 9.6 02/01/2016 1010   CALCIUM 8.9 08/19/2015 0440   ALKPHOS 85 02/01/2016 1010   ALKPHOS 72 08/18/2015 0100   AST 15 02/01/2016 1010   AST 22 08/18/2015 0100   ALT 12 02/01/2016 1010   ALT 14 08/18/2015 0100   BILITOT 0.41 02/01/2016 1010   BILITOT 0.9 08/18/2015 0100       RADIOGRAPHIC STUDIES: Ct Chest W Contrast  01/30/2016  CLINICAL DATA:  Lung cancer followup EXAM: CT CHEST, ABDOMEN, AND PELVIS WITH CONTRAST TECHNIQUE: Multidetector CT imaging of the chest, abdomen and pelvis was performed following the standard protocol during bolus administration of intravenous contrast. CONTRAST:  156m ISOVUE-300 IOPAMIDOL (ISOVUE-300) INJECTION 61% COMPARISON:  09/12/2015 FINDINGS: CT CHEST FINDINGS Mediastinum/Lymph Nodes: The heart size is normal. There is no pericardial effusion. Aortic atherosclerosis noted. Calcification in the LAD coronary artery noted. The trachea appears patent. Normal appearance of the esophagus. There is a large right paratracheal lymph node mass which measures 5.8 x 4.4 cm, image 16 of series 2. On the previous exam this measured 4.2 by 3.0 cm. Lower right paratracheal lymph node measures 1 cm, image 21 of series 2. Previously 0.9 cm. Sub- carinal lymph node measures 1 cm, image 26 of series 2. Previously 0.9 cm. Lungs/Pleura: No pleural fluid. Paramediastinal radiation change within the left lung is identified and appears stable. The right upper lobe perihilar mass is again noted. This measures 2.2 x 3.8 cm, image 51 of series 5. Not significantly changed in size from previous exam.  Moderate changes of centrilobular and paraseptal emphysema identified. New cluster of nodules within the periphery of the right lower lobe identified, image 59 of series 5. Musculoskeletal: The bones appear osteopenic. No aggressive lytic or sclerotic bone lesions identified. CT ABDOMEN PELVIS FINDINGS Hepatobiliary: The hyper attenuating structure within the inferior right lobe of liver anteriorly is identified and appears unchanged, image 60 of series 2. This is favored to represent a benign vascular abnormality. Gallbladder is normal. No biliary dilatation. Pancreas: Normal appearance of the pancreas. Spleen: The spleen appears normal. Adrenals/Urinary Tract: Normal adrenal glands. No worrisome kidney abnormality noted. There is scarring involving the inferior pole of the left kidney. Stomach/Bowel: The stomach is within  normal limits. The small bowel loops have a normal course and caliber. No obstruction. Normal appearance of the colon. Vascular/Lymphatic: Calcified atherosclerotic disease involves the abdominal aorta. No aneurysm. No enlarged retroperitoneal or mesenteric adenopathy. No enlarged pelvic or inguinal lymph nodes. Reproductive: No mass or other significant abnormality. Other: There is no ascites or focal fluid collections within the abdomen or pelvis. 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 IMPRESSION: 1. Interval progression of disease. The large right paratracheal lymph node has increased in size when compared with previous exam. 2. Stable right perihilar lung mass. 3. New clustered nodules in the periphery of the right lower lobe are favored to represent sequelae of inflammation/infection. 4. Aortic atherosclerosis and coronary artery calcification. Electronically Signed   By: Kerby Moors M.D.   On: 01/30/2016 09:29   Ct Abdomen Pelvis W Contrast  01/30/2016  CLINICAL DATA:  Lung cancer followup EXAM: CT CHEST, ABDOMEN, AND PELVIS WITH CONTRAST  TECHNIQUE: Multidetector CT imaging of the chest, abdomen and pelvis was performed following the standard protocol during bolus administration of intravenous contrast. CONTRAST:  124m ISOVUE-300 IOPAMIDOL (ISOVUE-300) INJECTION 61% COMPARISON:  09/12/2015 FINDINGS: CT CHEST FINDINGS Mediastinum/Lymph Nodes: The heart size is normal. There is no pericardial effusion. Aortic atherosclerosis noted. Calcification in the LAD coronary artery noted. The trachea appears patent. Normal appearance of the esophagus. There is a large right paratracheal lymph node mass which measures 5.8 x 4.4 cm, image 16 of series 2. On the previous exam this measured 4.2 by 3.0 cm. Lower right paratracheal lymph node measures 1 cm, image 21 of series 2. Previously 0.9 cm. Sub- carinal lymph node measures 1 cm, image 26 of series 2. Previously 0.9 cm. Lungs/Pleura: No pleural fluid. Paramediastinal radiation change within the left lung is identified and appears stable. The right upper lobe perihilar mass is again noted. This measures 2.2 x 3.8 cm, image 51 of series 5. Not significantly changed in size from previous exam. Moderate changes of centrilobular and paraseptal emphysema identified. New cluster of nodules within the periphery of the right lower lobe identified, image 59 of series 5. Musculoskeletal: The bones appear osteopenic. No aggressive lytic or sclerotic bone lesions identified. CT ABDOMEN PELVIS FINDINGS Hepatobiliary: The hyper attenuating structure within the inferior right lobe of liver anteriorly is identified and appears unchanged, image 60 of series 2. This is favored to represent a benign vascular abnormality. Gallbladder is normal. No biliary dilatation. Pancreas: Normal appearance of the pancreas. Spleen: The spleen appears normal. Adrenals/Urinary Tract: Normal adrenal glands. No worrisome kidney abnormality noted. There is scarring involving the inferior pole of the left kidney. Stomach/Bowel: The stomach is within  normal limits. The small bowel loops have a normal course and caliber. No obstruction. Normal appearance of the colon. Vascular/Lymphatic: Calcified atherosclerotic disease involves the abdominal aorta. No aneurysm. No enlarged retroperitoneal or mesenteric adenopathy. No enlarged pelvic or inguinal lymph nodes. Reproductive: No mass or other significant abnormality. Other: There is no ascites or focal fluid collections within the abdomen or pelvis. 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 IMPRESSION: 1. Interval progression of disease. The large right paratracheal lymph node has increased in size when compared with previous exam. 2. Stable right perihilar lung mass. 3. New clustered nodules in the periphery of the right lower lobe are favored to represent sequelae of inflammation/infection. 4. Aortic atherosclerosis and coronary artery calcification. Electronically Signed   By: TKerby MoorsM.D.   On: 01/30/2016 09:29   ASSESSMENT  AND PLAN: This is a very pleasant 75 years old African American female with metastatic non-small cell lung cancer, squamous cell carcinoma status post several chemotherapy regimen and and completed a course of palliative radiotherapy to the right hilum in February 2015.  She completed a course of treatment with immunotherapy with Nivolumab status post 20 cycles. This was discontinued today secondary to disease progression with further enlargement of the large right paratracheal lymph node. I discussed the scan results and showed the images to the patient and her son. I recommended for her to discontinue her current treatment with Nivolumab at this point. I discussed with the patient other treatment options including systemic chemotherapy with docetaxel 75 MG/M2 and Cyramza 10 MG/KG every 3 weeks with Neulasta support. I discussed with the patient the adverse effect of this treatment including but not limited to alopecia, myelosuppression,  nausea and vomiting, peripheral neuropathy, liver or renal dysfunction in addition to increased risk of pulmonary hemorrhage from treatment with Cyramza. The patient would like to proceed with treatment as planned. She is expected to start the first dose of this treatment next week. I will call her pharmacy with prescription for Decadron 8 mg by mouth twice a day the day before, day of and day after the chemotherapy every 3 weeks For pain management she will was given refills of MS Contin in addition to Vicodin. For the cough, she will continue on Tussinox. She was advised to call immediately if she has any concerning symptoms in the interval.  The patient voices understanding of current disease status and treatment options and is in agreement with the current care plan.  All questions were answered. The patient knows to call the clinic with any problems, questions or concerns. We can certainly see the patient much sooner if necessary.  Disclaimer: This note was dictated with voice recognition software. Similar sounding words can inadvertently be transcribed and may not be corrected upon review.

## 2016-02-01 NOTE — Progress Notes (Signed)
Oncology Nurse Navigator Documentation  Oncology Nurse Navigator Flowsheets 02/01/2016  Navigator Location [facility] Onc  Patient Visit Type MedOnc  Treatment Phase Treatment  Barriers/Navigation Needs Education  Education Other  Interventions Education Method  Education Method Verbal;Written  Acuity Level 2  Acuity Level 2 Educational needs  Time Spent with Patient 22   Spoke with patient and son today at [facility].  Dr. Julien Nordmann noted patient has disease progression.  She will be starting new tx.  I gave and explained information on new tx.  Dr. Julien Nordmann gave patient prescription of pain medication.  I gave and explained prescription to patient

## 2016-02-01 NOTE — Telephone Encounter (Signed)
Per staff message and POF I have scheduled appts. Advised scheduler of appts and to move labs. JMW  

## 2016-02-06 ENCOUNTER — Ambulatory Visit (HOSPITAL_BASED_OUTPATIENT_CLINIC_OR_DEPARTMENT_OTHER): Payer: Medicare Other

## 2016-02-06 ENCOUNTER — Telehealth: Payer: Self-pay | Admitting: *Deleted

## 2016-02-06 ENCOUNTER — Other Ambulatory Visit (HOSPITAL_BASED_OUTPATIENT_CLINIC_OR_DEPARTMENT_OTHER): Payer: Medicare Other

## 2016-02-06 VITALS — BP 134/82 | HR 112 | Temp 97.7°F | Resp 16

## 2016-02-06 DIAGNOSIS — Z5112 Encounter for antineoplastic immunotherapy: Secondary | ICD-10-CM

## 2016-02-06 DIAGNOSIS — C3411 Malignant neoplasm of upper lobe, right bronchus or lung: Secondary | ICD-10-CM

## 2016-02-06 DIAGNOSIS — Z5111 Encounter for antineoplastic chemotherapy: Secondary | ICD-10-CM | POA: Diagnosis not present

## 2016-02-06 LAB — CBC WITH DIFFERENTIAL/PLATELET
BASO%: 0.5 % (ref 0.0–2.0)
BASOS ABS: 0 10*3/uL (ref 0.0–0.1)
EOS%: 0 % (ref 0.0–7.0)
Eosinophils Absolute: 0 10*3/uL (ref 0.0–0.5)
HCT: 31.5 % — ABNORMAL LOW (ref 34.8–46.6)
HGB: 10.6 g/dL — ABNORMAL LOW (ref 11.6–15.9)
LYMPH%: 12.2 % — ABNORMAL LOW (ref 14.0–49.7)
MCH: 26.8 pg (ref 25.1–34.0)
MCHC: 33.6 g/dL (ref 31.5–36.0)
MCV: 79.7 fL (ref 79.5–101.0)
MONO#: 0 10*3/uL — ABNORMAL LOW (ref 0.1–0.9)
MONO%: 1.1 % (ref 0.0–14.0)
NEUT#: 3.6 10*3/uL (ref 1.5–6.5)
NEUT%: 86.2 % — AB (ref 38.4–76.8)
Platelets: 461 10*3/uL — ABNORMAL HIGH (ref 145–400)
RBC: 3.95 10*6/uL (ref 3.70–5.45)
RDW: 15.7 % — ABNORMAL HIGH (ref 11.2–14.5)
WBC: 4.1 10*3/uL (ref 3.9–10.3)
lymph#: 0.5 10*3/uL — ABNORMAL LOW (ref 0.9–3.3)

## 2016-02-06 LAB — COMPREHENSIVE METABOLIC PANEL
ALK PHOS: 78 U/L (ref 40–150)
ALT: 12 U/L (ref 0–55)
ANION GAP: 10 meq/L (ref 3–11)
AST: 15 U/L (ref 5–34)
Albumin: 3.2 g/dL — ABNORMAL LOW (ref 3.5–5.0)
BUN: 9.7 mg/dL (ref 7.0–26.0)
CHLORIDE: 101 meq/L (ref 98–109)
CO2: 27 meq/L (ref 22–29)
Calcium: 9.8 mg/dL (ref 8.4–10.4)
Creatinine: 0.8 mg/dL (ref 0.6–1.1)
EGFR: 80 mL/min/{1.73_m2} — ABNORMAL LOW (ref >90)
Glucose: 133 mg/dl (ref 70–140)
POTASSIUM: 4.8 meq/L (ref 3.5–5.1)
Sodium: 139 mEq/L (ref 136–145)
Total Bilirubin: 0.35 mg/dL (ref 0.20–1.20)
Total Protein: 7.5 g/dL (ref 6.4–8.3)

## 2016-02-06 LAB — UA PROTEIN, DIPSTICK - CHCC: Protein, ur: <30 mg/dL

## 2016-02-06 MED ORDER — SODIUM CHLORIDE 0.9% FLUSH
10.0000 mL | INTRAVENOUS | Status: DC | PRN
  Administered 2016-02-06: 10 mL
  Filled 2016-02-06: qty 10

## 2016-02-06 MED ORDER — DIPHENHYDRAMINE HCL 50 MG/ML IJ SOLN
50.0000 mg | Freq: Once | INTRAMUSCULAR | Status: AC
  Administered 2016-02-06: 50 mg via INTRAVENOUS

## 2016-02-06 MED ORDER — ACETAMINOPHEN 325 MG PO TABS
ORAL_TABLET | ORAL | Status: AC
  Filled 2016-02-06: qty 2

## 2016-02-06 MED ORDER — DIPHENHYDRAMINE HCL 50 MG/ML IJ SOLN
INTRAMUSCULAR | Status: AC
  Filled 2016-02-06: qty 1

## 2016-02-06 MED ORDER — SODIUM CHLORIDE 0.9 % IV SOLN
75.0000 mg/m2 | Freq: Once | INTRAVENOUS | Status: AC
  Administered 2016-02-06: 120 mg via INTRAVENOUS
  Filled 2016-02-06: qty 12

## 2016-02-06 MED ORDER — SODIUM CHLORIDE 0.9 % IV SOLN
10.0000 mg | Freq: Once | INTRAVENOUS | Status: AC
  Administered 2016-02-06: 10 mg via INTRAVENOUS
  Filled 2016-02-06: qty 1

## 2016-02-06 MED ORDER — ACETAMINOPHEN 325 MG PO TABS
650.0000 mg | ORAL_TABLET | Freq: Once | ORAL | Status: AC
  Administered 2016-02-06: 650 mg via ORAL

## 2016-02-06 MED ORDER — SODIUM CHLORIDE 0.9 % IV SOLN
10.0000 mg/kg | Freq: Once | INTRAVENOUS | Status: AC
  Administered 2016-02-06: 600 mg via INTRAVENOUS
  Filled 2016-02-06: qty 50

## 2016-02-06 MED ORDER — SODIUM CHLORIDE 0.9 % IV SOLN
Freq: Once | INTRAVENOUS | Status: AC
  Administered 2016-02-06: 09:00:00 via INTRAVENOUS

## 2016-02-06 MED ORDER — HEPARIN SOD (PORK) LOCK FLUSH 100 UNIT/ML IV SOLN
500.0000 [IU] | Freq: Once | INTRAVENOUS | Status: AC | PRN
  Administered 2016-02-06: 500 [IU]
  Filled 2016-02-06: qty 5

## 2016-02-06 NOTE — Telephone Encounter (Signed)
"  I need to know when to come in tomorrow for my injection."  Advised next appointment will be Thursday 02-08-2016 at 10:00 am.  Teach back method used.  Will see [place] Thursday for injection.

## 2016-02-06 NOTE — Patient Instructions (Signed)
[facility] Discharge Instructions for Patients Receiving Chemotherapy  Today you received the following chemotherapy agents Cyramza/Taxotere.  To help prevent nausea and vomiting after your treatment, we encourage you to take your nausea medication as prescribed.   If you develop nausea and vomiting that is not controlled by your nausea medication, call the clinic.   BELOW ARE SYMPTOMS THAT SHOULD BE REPORTED IMMEDIATELY:  *FEVER GREATER THAN 100.5 F  *CHILLS WITH OR WITHOUT FEVER  NAUSEA AND VOMITING THAT IS NOT CONTROLLED WITH YOUR NAUSEA MEDICATION  *UNUSUAL SHORTNESS OF BREATH  *UNUSUAL BRUISING OR BLEEDING  TENDERNESS IN MOUTH AND THROAT WITH OR WITHOUT PRESENCE OF ULCERS  *URINARY PROBLEMS  *BOWEL PROBLEMS  UNUSUAL RASH Items with * indicate a potential emergency and should be followed up as soon as possible.  Feel free to call the clinic you have any questions or concerns. The clinic phone number is [phone].  Please show the [facility] at check-in to the Emergency Department and triage nurse.

## 2016-02-07 ENCOUNTER — Telehealth: Payer: Self-pay

## 2016-02-07 NOTE — Telephone Encounter (Signed)
Called Destiny Harrison for chemotherapy F/U.  Patient is doing well.  Denies n/v.  Denies any new side effects or symptoms.  Bladder is functioning well. Pt c/o diarrhea, advised to call if symptoms worsen.  Eating and drinking well and I instructed to drink 64 oz minimum daily or at least the day before, of and after treatment.  Denies questions at this time and encouraged to call if needed.  Reviewed how to call after hours in the case of an emergency.

## 2016-02-07 NOTE — Telephone Encounter (Signed)
-----   Message from Renford Dills, RN sent at 02/06/2016 10:07 AM EDT ----- Regarding: chemo f/u Mohamed 1st Cyramza/Taxotere

## 2016-02-08 ENCOUNTER — Ambulatory Visit (HOSPITAL_BASED_OUTPATIENT_CLINIC_OR_DEPARTMENT_OTHER): Payer: Medicare Other

## 2016-02-08 DIAGNOSIS — Z5189 Encounter for other specified aftercare: Secondary | ICD-10-CM

## 2016-02-08 DIAGNOSIS — C3411 Malignant neoplasm of upper lobe, right bronchus or lung: Secondary | ICD-10-CM | POA: Diagnosis present

## 2016-02-08 MED ORDER — PEGFILGRASTIM INJECTION 6 MG/0.6ML ~~LOC~~
6.0000 mg | PREFILLED_SYRINGE | Freq: Once | SUBCUTANEOUS | Status: AC
  Administered 2016-02-08: 6 mg via SUBCUTANEOUS
  Filled 2016-02-08: qty 0.6

## 2016-02-12 ENCOUNTER — Inpatient Hospital Stay (HOSPITAL_COMMUNITY)
Admission: EM | Admit: 2016-02-12 | Discharge: 2016-02-18 | DRG: 871 | Disposition: A | Discharge status: Home Health | Payer: Medicare Other | Attending: Internal Medicine | Admitting: Internal Medicine

## 2016-02-12 ENCOUNTER — Emergency Department (HOSPITAL_COMMUNITY): Payer: Medicare Other

## 2016-02-12 ENCOUNTER — Encounter (HOSPITAL_COMMUNITY): Payer: Self-pay | Admitting: Emergency Medicine

## 2016-02-12 DIAGNOSIS — J45909 Unspecified asthma, uncomplicated: Secondary | ICD-10-CM | POA: Diagnosis present

## 2016-02-12 DIAGNOSIS — R739 Hyperglycemia, unspecified: Secondary | ICD-10-CM | POA: Diagnosis not present

## 2016-02-12 DIAGNOSIS — K5900 Constipation, unspecified: Secondary | ICD-10-CM | POA: Diagnosis not present

## 2016-02-12 DIAGNOSIS — C3490 Malignant neoplasm of unspecified part of unspecified bronchus or lung: Secondary | ICD-10-CM | POA: Insufficient documentation

## 2016-02-12 DIAGNOSIS — R5081 Fever presenting with conditions classified elsewhere: Secondary | ICD-10-CM | POA: Diagnosis present

## 2016-02-12 DIAGNOSIS — R1032 Left lower quadrant pain: Secondary | ICD-10-CM | POA: Diagnosis not present

## 2016-02-12 DIAGNOSIS — Z9981 Dependence on supplemental oxygen: Secondary | ICD-10-CM

## 2016-02-12 DIAGNOSIS — Z87891 Personal history of nicotine dependence: Secondary | ICD-10-CM

## 2016-02-12 DIAGNOSIS — J449 Chronic obstructive pulmonary disease, unspecified: Secondary | ICD-10-CM | POA: Diagnosis not present

## 2016-02-12 DIAGNOSIS — Z7952 Long term (current) use of systemic steroids: Secondary | ICD-10-CM

## 2016-02-12 DIAGNOSIS — D649 Anemia, unspecified: Secondary | ICD-10-CM | POA: Diagnosis present

## 2016-02-12 DIAGNOSIS — G893 Neoplasm related pain (acute) (chronic): Secondary | ICD-10-CM | POA: Diagnosis present

## 2016-02-12 DIAGNOSIS — C799 Secondary malignant neoplasm of unspecified site: Secondary | ICD-10-CM | POA: Diagnosis not present

## 2016-02-12 DIAGNOSIS — A419 Sepsis, unspecified organism: Secondary | ICD-10-CM | POA: Diagnosis not present

## 2016-02-12 DIAGNOSIS — Y95 Nosocomial condition: Secondary | ICD-10-CM | POA: Diagnosis present

## 2016-02-12 DIAGNOSIS — R0602 Shortness of breath: Secondary | ICD-10-CM | POA: Diagnosis not present

## 2016-02-12 DIAGNOSIS — C3411 Malignant neoplasm of upper lobe, right bronchus or lung: Secondary | ICD-10-CM | POA: Diagnosis present

## 2016-02-12 DIAGNOSIS — J9621 Acute and chronic respiratory failure with hypoxia: Secondary | ICD-10-CM | POA: Diagnosis present

## 2016-02-12 DIAGNOSIS — Z9221 Personal history of antineoplastic chemotherapy: Secondary | ICD-10-CM

## 2016-02-12 DIAGNOSIS — T380X5A Adverse effect of glucocorticoids and synthetic analogues, initial encounter: Secondary | ICD-10-CM | POA: Diagnosis not present

## 2016-02-12 DIAGNOSIS — R05 Cough: Secondary | ICD-10-CM | POA: Diagnosis not present

## 2016-02-12 DIAGNOSIS — R000 Tachycardia, unspecified: Secondary | ICD-10-CM | POA: Diagnosis present

## 2016-02-12 DIAGNOSIS — D709 Neutropenia, unspecified: Secondary | ICD-10-CM

## 2016-02-12 DIAGNOSIS — J189 Pneumonia, unspecified organism: Secondary | ICD-10-CM | POA: Diagnosis not present

## 2016-02-12 DIAGNOSIS — Z923 Personal history of irradiation: Secondary | ICD-10-CM

## 2016-02-12 DIAGNOSIS — Z91013 Allergy to seafood: Secondary | ICD-10-CM

## 2016-02-12 DIAGNOSIS — J440 Chronic obstructive pulmonary disease with acute lower respiratory infection: Secondary | ICD-10-CM | POA: Diagnosis not present

## 2016-02-12 DIAGNOSIS — Z66 Do not resuscitate: Secondary | ICD-10-CM | POA: Diagnosis present

## 2016-02-12 DIAGNOSIS — Z7951 Long term (current) use of inhaled steroids: Secondary | ICD-10-CM

## 2016-02-12 DIAGNOSIS — Z79891 Long term (current) use of opiate analgesic: Secondary | ICD-10-CM

## 2016-02-12 DIAGNOSIS — E785 Hyperlipidemia, unspecified: Secondary | ICD-10-CM | POA: Diagnosis present

## 2016-02-12 DIAGNOSIS — R0600 Dyspnea, unspecified: Secondary | ICD-10-CM

## 2016-02-12 DIAGNOSIS — Z79899 Other long term (current) drug therapy: Secondary | ICD-10-CM

## 2016-02-12 DIAGNOSIS — N3289 Other specified disorders of bladder: Secondary | ICD-10-CM | POA: Diagnosis not present

## 2016-02-12 DIAGNOSIS — E860 Dehydration: Secondary | ICD-10-CM | POA: Diagnosis present

## 2016-02-12 LAB — CBC WITH DIFFERENTIAL/PLATELET
BASOS ABS: 0 10*3/uL (ref 0.0–0.1)
Basophils Relative: 1 %
EOS ABS: 0 10*3/uL (ref 0.0–0.7)
Eosinophils Relative: 3 %
HCT: 31.9 % — ABNORMAL LOW (ref 36.0–46.0)
Hemoglobin: 10.8 g/dL — ABNORMAL LOW (ref 12.0–15.0)
LYMPHS ABS: 0.5 10*3/uL — AB (ref 0.7–4.0)
LYMPHS PCT: 33 %
MCH: 26.4 pg (ref 26.0–34.0)
MCHC: 33.9 g/dL (ref 30.0–36.0)
MCV: 78 fL (ref 78.0–100.0)
Monocytes Absolute: 0.4 10*3/uL (ref 0.1–1.0)
Monocytes Relative: 29 %
NEUTROS ABS: 0.6 10*3/uL — AB (ref 1.7–7.7)
Neutrophils Relative %: 34 %
Platelets: 363 10*3/uL (ref 150–400)
RBC: 4.09 MIL/uL (ref 3.87–5.11)
RDW: 14.6 % (ref 11.5–15.5)
WBC: 1.5 10*3/uL — AB (ref 4.0–10.5)

## 2016-02-12 LAB — URINALYSIS, ROUTINE W REFLEX MICROSCOPIC
BILIRUBIN URINE: NEGATIVE
GLUCOSE, UA: NEGATIVE mg/dL
HGB URINE DIPSTICK: NEGATIVE
KETONES UR: NEGATIVE mg/dL
Leukocytes, UA: NEGATIVE
Nitrite: NEGATIVE
PROTEIN: NEGATIVE mg/dL
Specific Gravity, Urine: 1.015 (ref 1.005–1.030)
pH: 7 (ref 5.0–8.0)

## 2016-02-12 LAB — PROTIME-INR
INR: 1.23 (ref 0.00–1.49)
Prothrombin Time: 15.7 seconds — ABNORMAL HIGH (ref 11.6–15.2)

## 2016-02-12 LAB — I-STAT CG4 LACTIC ACID, ED: LACTIC ACID, VENOUS: 2.06 mmol/L — AB (ref 0.5–2.0)

## 2016-02-12 LAB — COMPREHENSIVE METABOLIC PANEL
ALBUMIN: 3.3 g/dL — AB (ref 3.5–5.0)
ALK PHOS: 67 U/L (ref 38–126)
ALT: 19 U/L (ref 14–54)
AST: 17 U/L (ref 15–41)
Anion gap: 12 (ref 5–15)
BUN: 14 mg/dL (ref 6–20)
CALCIUM: 9 mg/dL (ref 8.9–10.3)
CO2: 26 mmol/L (ref 22–32)
CREATININE: 0.7 mg/dL (ref 0.44–1.00)
Chloride: 93 mmol/L — ABNORMAL LOW (ref 101–111)
GFR calc Af Amer: >60 mL/min (ref >60)
GFR calc non Af Amer: >60 mL/min (ref >60)
GLUCOSE: 252 mg/dL — AB (ref 65–99)
Potassium: 3.6 mmol/L (ref 3.5–5.1)
Sodium: 131 mmol/L — ABNORMAL LOW (ref 135–145)
TOTAL PROTEIN: 6.5 g/dL (ref 6.5–8.1)
Total Bilirubin: 1.1 mg/dL (ref 0.3–1.2)

## 2016-02-12 LAB — PROCALCITONIN: Procalcitonin: 1.02 ng/mL

## 2016-02-12 LAB — APTT: APTT: 36 s (ref 24–37)

## 2016-02-12 LAB — I-STAT TROPONIN, ED: TROPONIN I, POC: 0.01 ng/mL (ref 0.00–0.08)

## 2016-02-12 LAB — TROPONIN I: Troponin I: 0.03 ng/mL (ref <0.031)

## 2016-02-12 LAB — STREP PNEUMONIAE URINARY ANTIGEN: STREP PNEUMO URINARY ANTIGEN: NEGATIVE

## 2016-02-12 LAB — MAGNESIUM: MAGNESIUM: 1.2 mg/dL — AB (ref 1.7–2.4)

## 2016-02-12 MED ORDER — FOLIC ACID 0.5 MG HALF TAB
500.0000 ug | ORAL_TABLET | Freq: Every day | ORAL | Status: DC
  Administered 2016-02-13 – 2016-02-18 (×6): 0.5 mg via ORAL
  Filled 2016-02-12 (×6): qty 1

## 2016-02-12 MED ORDER — SIMVASTATIN 10 MG PO TABS
10.0000 mg | ORAL_TABLET | Freq: Every day | ORAL | Status: DC
  Administered 2016-02-12 – 2016-02-17 (×6): 10 mg via ORAL
  Filled 2016-02-12 (×6): qty 1

## 2016-02-12 MED ORDER — HYDROCODONE-ACETAMINOPHEN 5-325 MG PO TABS
1.0000 | ORAL_TABLET | ORAL | Status: DC | PRN
  Administered 2016-02-15 – 2016-02-17 (×4): 1 via ORAL
  Filled 2016-02-12 (×4): qty 1

## 2016-02-12 MED ORDER — HYDROMORPHONE HCL 1 MG/ML IJ SOLN
1.0000 mg | Freq: Once | INTRAMUSCULAR | Status: AC
  Administered 2016-02-12: 1 mg via INTRAVENOUS
  Filled 2016-02-12: qty 1

## 2016-02-12 MED ORDER — BENZONATATE 100 MG PO CAPS: 100.0000 mg | ORAL_CAPSULE | Freq: Three times a day (TID) | ORAL | Status: DC | PRN

## 2016-02-12 MED ORDER — DEXTROSE 5 % IV SOLN
2.0000 g | Freq: Three times a day (TID) | INTRAVENOUS | Status: DC
  Administered 2016-02-12 – 2016-02-15 (×9): 2 g via INTRAVENOUS
  Filled 2016-02-12 (×9): qty 2

## 2016-02-12 MED ORDER — SODIUM CHLORIDE 0.9 % IV SOLN
INTRAVENOUS | Status: DC
  Administered 2016-02-12 – 2016-02-13 (×2): via INTRAVENOUS

## 2016-02-12 MED ORDER — HYDROMORPHONE HCL 1 MG/ML IJ SOLN
1.0000 mg | INTRAMUSCULAR | Status: DC | PRN
  Administered 2016-02-13 – 2016-02-17 (×8): 1 mg via INTRAVENOUS
  Filled 2016-02-12 (×8): qty 1

## 2016-02-12 MED ORDER — SODIUM CHLORIDE 0.9% FLUSH
10.0000 mL | INTRAVENOUS | Status: DC | PRN
  Administered 2016-02-16 – 2016-02-18 (×4): 10 mL
  Filled 2016-02-12 (×4): qty 40

## 2016-02-12 MED ORDER — GUAIFENESIN ER 600 MG PO TB12
600.0000 mg | ORAL_TABLET | Freq: Two times a day (BID) | ORAL | Status: DC
  Administered 2016-02-12 – 2016-02-18 (×12): 600 mg via ORAL
  Filled 2016-02-12 (×12): qty 1

## 2016-02-12 MED ORDER — DEXTROSE 5 % IV SOLN
2.0000 g | Freq: Once | INTRAVENOUS | Status: AC
  Administered 2016-02-12: 2 g via INTRAVENOUS
  Filled 2016-02-12: qty 2

## 2016-02-12 MED ORDER — IOPAMIDOL (ISOVUE-370) INJECTION 76%
100.0000 mL | Freq: Once | INTRAVENOUS | Status: AC | PRN
  Administered 2016-02-12: 100 mL via INTRAVENOUS

## 2016-02-12 MED ORDER — IPRATROPIUM-ALBUTEROL 0.5-2.5 (3) MG/3ML IN SOLN
3.0000 mL | RESPIRATORY_TRACT | Status: DC | PRN
  Filled 2016-02-12: qty 3

## 2016-02-12 MED ORDER — FAMOTIDINE 20 MG PO TABS
20.0000 mg | ORAL_TABLET | Freq: Two times a day (BID) | ORAL | Status: DC
  Administered 2016-02-12 – 2016-02-18 (×12): 20 mg via ORAL
  Filled 2016-02-12 (×12): qty 1

## 2016-02-12 MED ORDER — ACETAMINOPHEN 325 MG PO TABS
650.0000 mg | ORAL_TABLET | Freq: Four times a day (QID) | ORAL | Status: DC | PRN
  Administered 2016-02-12: 650 mg via ORAL
  Filled 2016-02-12: qty 2

## 2016-02-12 MED ORDER — SODIUM CHLORIDE 0.9% FLUSH
10.0000 mL | Freq: Two times a day (BID) | INTRAVENOUS | Status: DC
  Administered 2016-02-16 – 2016-02-17 (×3): 10 mL

## 2016-02-12 MED ORDER — MAGNESIUM SULFATE 2 GM/50ML IV SOLN
2.0000 g | Freq: Once | INTRAVENOUS | Status: AC
  Administered 2016-02-12: 2 g via INTRAVENOUS
  Filled 2016-02-12: qty 50

## 2016-02-12 MED ORDER — SODIUM CHLORIDE 0.9% FLUSH
3.0000 mL | Freq: Two times a day (BID) | INTRAVENOUS | Status: DC
  Administered 2016-02-12 – 2016-02-14 (×3): 3 mL via INTRAVENOUS

## 2016-02-12 MED ORDER — MAGNESIUM OXIDE 400 (241.3 MG) MG PO TABS
400.0000 mg | ORAL_TABLET | Freq: Two times a day (BID) | ORAL | Status: DC
  Administered 2016-02-12 – 2016-02-18 (×12): 400 mg via ORAL
  Filled 2016-02-12 (×12): qty 1

## 2016-02-12 MED ORDER — ENOXAPARIN SODIUM 40 MG/0.4ML ~~LOC~~ SOLN
40.0000 mg | SUBCUTANEOUS | Status: DC
  Administered 2016-02-12 – 2016-02-17 (×6): 40 mg via SUBCUTANEOUS
  Filled 2016-02-12 (×6): qty 0.4

## 2016-02-12 MED ORDER — VANCOMYCIN HCL IN DEXTROSE 1-5 GM/200ML-% IV SOLN
1000.0000 mg | Freq: Once | INTRAVENOUS | Status: AC
  Administered 2016-02-12: 1000 mg via INTRAVENOUS
  Filled 2016-02-12: qty 200

## 2016-02-12 MED ORDER — SODIUM CHLORIDE 0.9 % IV BOLUS (SEPSIS)
1000.0000 mL | INTRAVENOUS | Status: AC
  Administered 2016-02-12 (×2): 1000 mL via INTRAVENOUS

## 2016-02-12 MED ORDER — MORPHINE SULFATE ER 15 MG PO TBCR
15.0000 mg | EXTENDED_RELEASE_TABLET | Freq: Two times a day (BID) | ORAL | Status: DC
  Administered 2016-02-12 – 2016-02-18 (×12): 15 mg via ORAL
  Filled 2016-02-12 (×12): qty 1

## 2016-02-12 MED ORDER — IPRATROPIUM-ALBUTEROL 0.5-2.5 (3) MG/3ML IN SOLN
3.0000 mL | RESPIRATORY_TRACT | Status: DC
  Administered 2016-02-12 – 2016-02-14 (×9): 3 mL via RESPIRATORY_TRACT
  Filled 2016-02-12 (×10): qty 3

## 2016-02-12 MED ORDER — BUDESONIDE 0.25 MG/2ML IN SUSP
0.2500 mg | Freq: Two times a day (BID) | RESPIRATORY_TRACT | Status: DC
  Administered 2016-02-12 – 2016-02-18 (×12): 0.25 mg via RESPIRATORY_TRACT
  Filled 2016-02-12 (×13): qty 2

## 2016-02-12 MED ORDER — DILTIAZEM HCL ER COATED BEADS 180 MG PO CP24
180.0000 mg | ORAL_CAPSULE | Freq: Every day | ORAL | Status: DC
  Administered 2016-02-13 – 2016-02-18 (×6): 180 mg via ORAL
  Filled 2016-02-12 (×6): qty 1

## 2016-02-12 MED ORDER — SODIUM CHLORIDE 0.9 % IV BOLUS (SEPSIS)
1000.0000 mL | Freq: Once | INTRAVENOUS | Status: AC
  Administered 2016-02-12: 1000 mL via INTRAVENOUS

## 2016-02-12 MED ORDER — VANCOMYCIN HCL 500 MG IV SOLR
500.0000 mg | Freq: Two times a day (BID) | INTRAVENOUS | Status: DC
  Administered 2016-02-13 – 2016-02-16 (×8): 500 mg via INTRAVENOUS
  Filled 2016-02-12 (×8): qty 500

## 2016-02-12 MED ORDER — METHYLPREDNISOLONE SODIUM SUCC 125 MG IJ SOLR
60.0000 mg | Freq: Four times a day (QID) | INTRAMUSCULAR | Status: DC
Start: 2016-02-12 — End: 2016-02-13
  Administered 2016-02-12 – 2016-02-13 (×3): 60 mg via INTRAVENOUS
  Filled 2016-02-12 (×3): qty 2

## 2016-02-12 MED ORDER — CALCIUM CARBONATE-VITAMIN D 500-200 MG-UNIT PO TABS
1.0000 | ORAL_TABLET | Freq: Every day | ORAL | Status: DC
  Administered 2016-02-13 – 2016-02-18 (×6): 1 via ORAL
  Filled 2016-02-12 (×6): qty 1

## 2016-02-12 NOTE — ED Notes (Signed)
Pt complaint of increasing SOB onset 4/22; hx of stage 4 lung cancer and "lump in throat."

## 2016-02-12 NOTE — ED Provider Notes (Signed)
CSN: [id number]     Arrival date & time 02/12/16  1058 History   First MD Initiated Contact with Patient 02/12/16 1123     Chief Complaint  Patient presents with  . Shortness of Breath     (Consider location/radiation/quality/duration/timing/severity/associated sxs/prior Treatment) Patient is a 75 y.o. female presenting with shortness of breath.  Shortness of Breath Severity:  Moderate Onset quality:  Gradual Duration: it started "2 yrs ago" but worsening over last year and in last day. Timing:  Constant Progression:  Worsening Chronicity:  New Associated symptoms: abdominal pain and cough (3 days nonproductive)   Associated symptoms: no chest pain, no fever, no headaches, no neck pain, no rash, no sore throat and no vomiting     Past Medical History  Diagnosis Date  . COPD (chronic obstructive pulmonary disease) ([place])   . Neutropenia, drug-induced ([place]) 05/05/2012  . Hyperlipidemia   . Rheumatoid arthritis(714.0)   . Asthma   . Hiatal hernia   . History of chemotherapy   . History of radiation therapy 03/06/2012    left hilum  . History of radiation therapy 05/10/2013-05/31/2013    Left lung/ 33/75'@2'$ .25 per fraction x 15 fractions  . Radiation 11/15/13-11/26/13    Right hilum 30 Gy in 10 fractions  . Non-small cell lung cancer ([place]) dx'd 08/28/11    left lung  . Encounter for antineoplastic immunotherapy 05/22/2015  . DNR (do not resuscitate) 06/26/2015  . Primary cancer of right upper lobe of lung ([place]) 02/06/2012  . Dysphagia 12/07/2015   Past Surgical History  Procedure Laterality Date  . Appendex  1962  . Video bronchoscopy  01/28/2012    Procedure: VIDEO BRONCHOSCOPY WITHOUT FLUORO;  Surgeon: Brand Males, MD;  Location: [facility] ENDOSCOPY;  Service: Endoscopy;  Laterality: Bilateral;  . Surgery on right wrist     Family History  Problem Relation Age of Onset  . Diabetes Mother     insulin dependent  . Breast cancer Mother    Social History  Substance Use Topics  .  Smoking status: Former Smoker -- 0.50 packs/day for 40 years    Types: Cigarettes    Quit date: 10/29/2009  . Smokeless tobacco: Never Used  . Alcohol Use: No   OB History    No data available     Review of Systems  Constitutional: Positive for appetite change and fatigue. Negative for fever.  HENT: Negative for sore throat.   Eyes: Negative for visual disturbance.  Respiratory: Positive for cough (3 days nonproductive) and shortness of breath.   Cardiovascular: Negative for chest pain and leg swelling.  Gastrointestinal: Positive for nausea, abdominal pain and constipation (reports she hasn't had a BM since "March 22"). Negative for vomiting, diarrhea and blood in stool.  Genitourinary: Negative for difficulty urinating.  Musculoskeletal: Negative for back pain and neck pain.  Skin: Negative for rash.  Neurological: Negative for syncope and headaches.      Allergies  Shellfish allergy  Home Medications   Prior to Admission medications   Medication Sig Start Date End Date Taking? Authorizing Provider  albuterol (PROAIR HFA) 108 (90 BASE) MCG/ACT inhaler Inhale 2 puffs into the lungs every 6 (six) hours as needed for wheezing or shortness of breath. 08/31/15  Yes Brand Males, MD  albuterol (PROVENTIL) (2.5 MG/3ML) 0.083% nebulizer solution Take 2.5 mg by nebulization every 8 (eight) hours.   Yes Historical Provider, MD  benzonatate (TESSALON) 100 MG capsule Take 100 mg by mouth every 8 (eight) hours as needed for  cough.  01/05/16  Yes Historical Provider, MD  budesonide (PULMICORT) 0.25 MG/2ML nebulizer solution Take 2 mLs (0.25 mg total) by nebulization 2 (two) times daily. 08/20/15  Yes Belkys A Regalado, MD  calcium-vitamin D (OSCAL WITH D) 500-200 MG-UNIT per tablet Take 1 tablet by mouth daily.   Yes Historical Provider, MD  dexamethasone (DECADRON) 4 MG tablet 2 tablets by mouth twice a day the day before, day of and day after the chemotherapy every 3 weeks. 02/01/16  Yes  Curt Bears, MD  diltiazem (CARDIZEM CD) 180 MG 24 hr capsule Take 180 mg by mouth daily. 01/16/16 02/15/16 Yes Historical Provider, MD  folic acid (FOLVITE) 063 MCG tablet Take 400 mcg by mouth daily.   Yes Historical Provider, MD  guaiFENesin (MUCINEX) 600 MG 12 hr tablet Take 1 tablet (600 mg total) by mouth 2 (two) times daily. 06/27/15  Yes Reyne Dumas, MD  HYDROcodone-acetaminophen (NORCO/VICODIN) 5-325 MG tablet Take 1 tablet by mouth every 4 (four) hours as needed for moderate pain. 02/01/16  Yes Curt Bears, MD  ipratropium (ATROVENT) 0.02 % nebulizer solution Take 0.5 mg by nebulization every 8 (eight) hours.   Yes Historical Provider, MD  morphine (MS CONTIN) 15 MG 12 hr tablet Take 1 tablet (15 mg total) by mouth every 12 (twelve) hours. 02/01/16  Yes Curt Bears, MD  ondansetron (ZOFRAN ODT) 8 MG disintegrating tablet Take 1 tablet (8 mg total) by mouth every 8 (eight) hours as needed for nausea or vomiting. 04/04/15  Yes Maryanna Shape, NP  ondansetron (ZOFRAN) 8 MG tablet Take 8 mg by mouth every 8 (eight) hours as needed. Reported on 02/01/2016   Yes Historical Provider, MD  pantoprazole (PROTONIX) 40 MG tablet Take 40 mg by mouth daily as needed (acid reflux).  01/16/16 02/15/16 Yes Historical Provider, MD  predniSONE (DELTASONE) 5 MG tablet Take 5 mg by mouth daily. Reported on 01/18/2016 10/09/15  Yes Historical Provider, MD  prochlorperazine (COMPAZINE) 10 MG tablet Take 1 tablet (10 mg total) by mouth every 6 (six) hours as needed for nausea or vomiting. 03/21/15  Yes Adrena E Johnson, PA-C  simvastatin (ZOCOR) 5 MG tablet Take 10 mg by mouth at bedtime.    Yes Historical Provider, MD  OXYGEN Inhale 2 L into the lungs continuous.    Historical Provider, MD   BP 123/62 mmHg  Pulse 124  Temp(Src) 97.8 F (36.6 C) (Oral)  Resp 24  Ht '5\' 4"'$  (1.626 m)  Wt 114 lb 10.2 oz (52 kg)  BMI 19.67 kg/m2  SpO2 99% Physical Exam  Constitutional: She is oriented to person, place, and  time. She appears well-developed. She appears cachectic. She appears ill. No distress.  HENT:  Head: Normocephalic and atraumatic.  Eyes: Conjunctivae and EOM are normal.  Neck: Normal range of motion.  Cardiovascular: Normal rate, regular rhythm, normal heart sounds and intact distal pulses.  Exam reveals no gallop and no friction rub.   No murmur heard. Pulmonary/Chest: Effort normal. Tachypnea noted. No respiratory distress. Decreased breath sounds: LUL. She has wheezes (occasional). She has no rales.  Abdominal: Soft. She exhibits no distension. There is tenderness in the left lower quadrant. There is guarding.  Musculoskeletal: She exhibits no edema or tenderness.  Neurological: She is alert and oriented to person, place, and time.  Skin: Skin is warm and dry. No rash noted. She is not diaphoretic. No erythema.  Nursing note and vitals reviewed.   ED Course  Procedures (including critical care time) Labs  Review Labs Reviewed  COMPREHENSIVE METABOLIC PANEL - Abnormal; Notable for the following:    Sodium 131 (*)    Chloride 93 (*)    Glucose, Bld 252 (*)    Albumin 3.3 (*)    All other components within normal limits  CBC WITH DIFFERENTIAL/PLATELET - Abnormal; Notable for the following:    WBC 1.5 (*)    Hemoglobin 10.8 (*)    HCT 31.9 (*)    Neutro Abs 0.6 (*)    Lymphs Abs 0.5 (*)    All other components within normal limits  MAGNESIUM - Abnormal; Notable for the following:    Magnesium 1.2 (*)    All other components within normal limits  PROTIME-INR - Abnormal; Notable for the following:    Prothrombin Time 15.7 (*)    All other components within normal limits  I-STAT CG4 LACTIC ACID, ED - Abnormal; Notable for the following:    Lactic Acid, Venous 2.06 (*)    All other components within normal limits  CULTURE, BLOOD (ROUTINE X 2)  CULTURE, BLOOD (ROUTINE X 2)  URINE CULTURE  URINALYSIS, ROUTINE W REFLEX MICROSCOPIC (NOT AT [facility])  PROCALCITONIN  APTT  TROPONIN I   STREP PNEUMONIAE URINARY ANTIGEN  CBC WITH DIFFERENTIAL/PLATELET  BASIC METABOLIC PANEL  TROPONIN I  TROPONIN I  MAGNESIUM  I-STAT TROPOININ, ED  I-STAT CG4 LACTIC ACID, ED    Imaging Review Dg Chest 2 View  02/12/2016  CLINICAL DATA:  Increasing shortness of breath beginning 02/10/2016. Stage IV lung cancer. EXAM: CHEST  2 VIEW COMPARISON:  Chest x-ray 01/10/2016.  Chest CT 01/30/2016. FINDINGS: Large right paratracheal mass again noted as seen on CT. Right perihilar opacity also appears stable. Probable postradiation changes in the medial left upper lobe, stable. Heart is normal size. No effusions. No acute bony abnormality. IMPRESSION: Stable right paratracheal mass and right perihilar opacity. Stable postradiation changes in the left upper lobe. Electronically Signed   By: Rolm Baptise M.D.   On: 02/12/2016 12:17   Ct Angio Chest Pe W/cm &/or Wo Cm  02/12/2016  CLINICAL DATA:  Shortness of breath, current history of lung cancer. EXAM: CT ANGIOGRAPHY CHEST CT ABDOMEN AND PELVIS WITH CONTRAST TECHNIQUE: Multidetector CT imaging of the chest was performed using the standard protocol during bolus administration of intravenous contrast. Multiplanar CT image reconstructions and MIPs were obtained to evaluate the vascular anatomy. Multidetector CT imaging of the abdomen and pelvis was performed using the standard protocol during bolus administration of intravenous contrast. CONTRAST:  100 mL of Isovue 370 intravenously. COMPARISON:  CT scan of January 30, 2016. FINDINGS: CTA CHEST FINDINGS There is no definite evidence of pulmonary embolus. There is no evidence of thoracic aortic dissection or aneurysm. Atherosclerosis of thoracic aorta is noted. No pneumothorax is noted. Stable atelectasis or scarring is seen involving the right middle lobe. No significant pleural effusion is noted. Stable left perihilar post radiation changes are noted. Mild biapical scarring is noted. 6 mm nodule is noted posteriorly  in the right lower which was present on prior exam is concerning for metastatic disease. This is best seen on image number 45 of series 12. 6.4 x 4.5 cm right peritracheal mass is noted which may be slightly enlarged compared to prior exam. Right internal jugular Port-A-Cath is noted with tip in expected position of the SVC. Stable 9 mm precarinal lymph node is noted. 12 mm sub carinal lymph node is noted which is slightly enlarged compared to prior exam. CT ABDOMEN and  PELVIS FINDINGS Moderate degenerative disc disease is noted at L3-4 and L4-5. 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 No gallstones are noted. Staple enhancing abnormality is noted anteriorly in the right hepatic lobe most consistent with benign vascular abnormality. No other definite hepatic lesions are noted. The spleen and pancreas appear normal. Adrenal glands and kidneys appear normal. No hydronephrosis or renal obstruction is noted. Atherosclerosis of abdominal aorta is noted without aneurysm formation. Uterus and ovaries are unremarkable. There is no evidence of bowel obstruction. No abnormal fluid collection is noted. Moderate urinary bladder distention is noted. There is no definite evidence of adenopathy seen in the abdomen or pelvis. Review of the MIP images confirms the above findings. IMPRESSION: No definite evidence of pulmonary embolus. Atherosclerosis of thoracic aorta is noted without aneurysm or dissection. Stable atelectasis or scarring seen in the right middle lobe. Stable left perihilar radiation changes. Mildly enlarged right peritracheal mass is noted consistent with malignancy or metastatic disease. Stable mediastinal adenopathy and right lower lobe pulmonary nodule is noted. Atherosclerosis of abdominal aorta without aneurysm formation. Moderate distention of urinary bladder is noted. No definite metastatic disease seen in the abdomen or pelvis. Electronically Signed   By: Marijo Conception, M.D.    On: 02/12/2016 14:25   Ct Abdomen Pelvis W Contrast  02/12/2016  CLINICAL DATA:  Shortness of breath, current history of lung cancer. EXAM: CT ANGIOGRAPHY CHEST CT ABDOMEN AND PELVIS WITH CONTRAST TECHNIQUE: Multidetector CT imaging of the chest was performed using the standard protocol during bolus administration of intravenous contrast. Multiplanar CT image reconstructions and MIPs were obtained to evaluate the vascular anatomy. Multidetector CT imaging of the abdomen and pelvis was performed using the standard protocol during bolus administration of intravenous contrast. CONTRAST:  100 mL of Isovue 370 intravenously. COMPARISON:  CT scan of January 30, 2016. FINDINGS: CTA CHEST FINDINGS There is no definite evidence of pulmonary embolus. There is no evidence of thoracic aortic dissection or aneurysm. Atherosclerosis of thoracic aorta is noted. No pneumothorax is noted. Stable atelectasis or scarring is seen involving the right middle lobe. No significant pleural effusion is noted. Stable left perihilar post radiation changes are noted. Mild biapical scarring is noted. 6 mm nodule is noted posteriorly in the right lower which was present on prior exam is concerning for metastatic disease. This is best seen on image number 45 of series 12. 6.4 x 4.5 cm right peritracheal mass is noted which may be slightly enlarged compared to prior exam. Right internal jugular Port-A-Cath is noted with tip in expected position of the SVC. Stable 9 mm precarinal lymph node is noted. 12 mm sub carinal lymph node is noted which is slightly enlarged compared to prior exam. CT ABDOMEN and PELVIS FINDINGS Moderate degenerative disc disease is noted at L3-4 and L4-5. 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 No gallstones are noted. Staple enhancing abnormality is noted anteriorly in the right hepatic lobe most consistent with benign vascular abnormality. No other definite hepatic lesions are noted. The  spleen and pancreas appear normal. Adrenal glands and kidneys appear normal. No hydronephrosis or renal obstruction is noted. Atherosclerosis of abdominal aorta is noted without aneurysm formation. Uterus and ovaries are unremarkable. There is no evidence of bowel obstruction. No abnormal fluid collection is noted. Moderate urinary bladder distention is noted. There is no definite evidence of adenopathy seen in the abdomen or pelvis. Review of the MIP images confirms the above findings. IMPRESSION: No definite  evidence of pulmonary embolus. Atherosclerosis of thoracic aorta is noted without aneurysm or dissection. Stable atelectasis or scarring seen in the right middle lobe. Stable left perihilar radiation changes. Mildly enlarged right peritracheal mass is noted consistent with malignancy or metastatic disease. Stable mediastinal adenopathy and right lower lobe pulmonary nodule is noted. Atherosclerosis of abdominal aorta without aneurysm formation. Moderate distention of urinary bladder is noted. No definite metastatic disease seen in the abdomen or pelvis. Electronically Signed   By: Marijo Conception, M.D.   On: 02/12/2016 14:25   I have personally reviewed and evaluated these images and lab results as part of my medical decision-making.   EKG Interpretation   Date/Time:  Monday February 12 2016 15:24:45 EDT Ventricular Rate:  143 PR Interval:  111 QRS Duration: 75 QT Interval:  345 QTC Calculation: 532 R Axis:   85 Text Interpretation:  Sinus tachycardia Consider right atrial enlargement  Borderline right axis deviation Nonspecific T abnormalities, inferior  leads Prolonged QT interval No significant change since last tracing  Confirmed by [facility] MD, [place] ([id number]) on 02/12/2016 9:28:24 PM      MDM   Final diagnoses:  None    66 her old female with a history of metastatic non-small cell lung cancer with progression of disease despite chemo/radiation on 2L of O2 at home presents with  concern for shortness of breath, cough, and abdominal pain with constipation.  Patient tachycardic, tachypneic with oral temp of 99.8, mild elevation in lactic acid,and given cough/tachycardia/tachypnea she was given vanc/cefepime for concern for HCAP and 30cc/kg normal saline.  ANC of 600. Given dyspnea and tachycardia, CT PE study ordered showing no sign of PE and no CT evidence of pneumonia.  Pt with abdominal tenderness and report of no BM and CT abd/pelvis was obtained which showed no acute findings.  Patient had BM in ED and feels abdominal pain has improved.   Despite IV fluids, patient remains tachycardic. (Baseline tachycardia is 120s, however pt 140s at this time.) EKG shows sinus tachycardia. Ordered 3rd liter of NS.  Suspect dehydration and/or sepsis secondary to pneumonia with possible occult infection given neutropenia.    Gareth Morgan, MD 02/12/16 2129

## 2016-02-12 NOTE — H&P (Signed)
[facility] History and Physical  Destiny Harrison MIW:[id number] DOB: 11/03/1940 DOA: 02/12/2016  PCP: Thressa Sheller, MD  Specialists: Dr. Julien Nordmann, oncology  Chief Complaint: Shortness of breath  HPI: Destiny Harrison is a 75 y.o. woman with a history of metastatic NSCLC, diagnosed in 2013, who has had progression of disease despite prior radiation and chemotherapy.  She just started a new palliative chemotherapy regimen last week.  She says that she felt fine going into the weekend, but over the past 24 hours, she has had progressive shortness of breath.  She reports substernal chest pain that radiates to her back.  She has had some light-headedness but no LOC.  Her appetite has been down.   She has had nausea and dry heaves.  She has had intermittent abdominal pain that improved after large bowel movements.  No documented fever prior to arrival to the ED today.  CT of her chest, abdomen, and pelvis were obtained; PE has been ruled out.  She has persistent sinus tachycardia after aggressive volume resuscitation (sepsis protocol).  Empiric antibiotics have been initiated (the patient reports being hospitalized in [facility] in late March for pneumonia).  Hospitalist asked to admit for further evaluation and treatment.  Review of Systems: She denies reflux symptoms.  She wears oxygen 2L [place] at baseline.  She had self-limited diarrhea after chemotherapy last week.  Otherwise, 12 systems reviewed and negative except as stated in the HPI.  Past Medical History  Diagnosis Date  . COPD (chronic obstructive pulmonary disease) ([place])   . Neutropenia, drug-induced ([place]) 05/05/2012  . Hyperlipidemia   . Rheumatoid arthritis(714.0)   . Asthma   . Hiatal hernia   . History of chemotherapy   . History of radiation therapy 03/06/2012    left hilum  . History of radiation therapy 05/10/2013-05/31/2013    Left lung/ 33/75'@2'$ .25 per fraction x 15 fractions  . Radiation 11/15/13-11/26/13    Right hilum 30 Gy in 10  fractions  . Non-small cell lung cancer ([place]) dx'd 08/28/11    left lung  . Encounter for antineoplastic immunotherapy 05/22/2015  . DNR (do not resuscitate) 06/26/2015  . Primary cancer of right upper lobe of lung ([place]) 02/06/2012  . Dysphagia 12/07/2015   Past Surgical History  Procedure Laterality Date  . Appendex  1962  . Video bronchoscopy  01/28/2012    Procedure: VIDEO BRONCHOSCOPY WITHOUT FLUORO;  Surgeon: Brand Males, MD;  Location: [facility] ENDOSCOPY;  Service: Endoscopy;  Laterality: Bilateral;  . Surgery on right wrist     Social History:  Social History   Social History Narrative   Spiritual person. Member of [employer] at [employer] where she lives since age 32. Never missed a sermon. Sings in choir. CHildren go to same church but husband does not. Stated 10/30/11 she is not afraid of lung cancer diagnosis or prognosis because it is in "god's hand" and "god's will".   She is married.  She has four children. No tobacco, EtOH, or illicit drug use.  Allergies  Allergen Reactions  . Shellfish Allergy Swelling    Family History  Problem Relation Age of Onset  . Diabetes Mother     insulin dependent  . Breast cancer Mother    Prior to Admission medications   Medication Sig Start Date End Date Taking? Authorizing Provider  albuterol (PROAIR HFA) 108 (90 BASE) MCG/ACT inhaler Inhale 2 puffs into the lungs every 6 (six) hours as needed for wheezing or shortness of breath. 08/31/15  Yes Murali  Ramaswamy, MD  albuterol (PROVENTIL) (2.5 MG/3ML) 0.083% nebulizer solution Take 2.5 mg by nebulization every 8 (eight) hours.   Yes Historical Provider, MD  benzonatate (TESSALON) 100 MG capsule Take 100 mg by mouth every 8 (eight) hours as needed for cough.  01/05/16  Yes Historical Provider, MD  budesonide (PULMICORT) 0.25 MG/2ML nebulizer solution Take 2 mLs (0.25 mg total) by nebulization 2 (two) times daily. 08/20/15  Yes Belkys A Regalado, MD  calcium-vitamin D (OSCAL WITH D)  500-200 MG-UNIT per tablet Take 1 tablet by mouth daily.   Yes Historical Provider, MD  dexamethasone (DECADRON) 4 MG tablet 2 tablets by mouth twice a day the day before, day of and day after the chemotherapy every 3 weeks. 02/01/16  Yes Curt Bears, MD  diltiazem (CARDIZEM CD) 180 MG 24 hr capsule Take 180 mg by mouth daily. 01/16/16 02/15/16 Yes Historical Provider, MD  folic acid (FOLVITE) 650 MCG tablet Take 400 mcg by mouth daily.   Yes Historical Provider, MD  guaiFENesin (MUCINEX) 600 MG 12 hr tablet Take 1 tablet (600 mg total) by mouth 2 (two) times daily. 06/27/15  Yes Reyne Dumas, MD  HYDROcodone-acetaminophen (NORCO/VICODIN) 5-325 MG tablet Take 1 tablet by mouth every 4 (four) hours as needed for moderate pain. 02/01/16  Yes Curt Bears, MD  ipratropium (ATROVENT) 0.02 % nebulizer solution Take 0.5 mg by nebulization every 8 (eight) hours.   Yes Historical Provider, MD  morphine (MS CONTIN) 15 MG 12 hr tablet Take 1 tablet (15 mg total) by mouth every 12 (twelve) hours. 02/01/16  Yes Curt Bears, MD  ondansetron (ZOFRAN ODT) 8 MG disintegrating tablet Take 1 tablet (8 mg total) by mouth every 8 (eight) hours as needed for nausea or vomiting. 04/04/15  Yes Maryanna Shape, NP  ondansetron (ZOFRAN) 8 MG tablet Take 8 mg by mouth every 8 (eight) hours as needed. Reported on 02/01/2016   Yes Historical Provider, MD  pantoprazole (PROTONIX) 40 MG tablet Take 40 mg by mouth daily as needed (acid reflux).  01/16/16 02/15/16 Yes Historical Provider, MD  predniSONE (DELTASONE) 5 MG tablet Take 5 mg by mouth daily. Reported on 01/18/2016 10/09/15  Yes Historical Provider, MD  prochlorperazine (COMPAZINE) 10 MG tablet Take 1 tablet (10 mg total) by mouth every 6 (six) hours as needed for nausea or vomiting. 03/21/15  Yes Adrena E Johnson, PA-C  simvastatin (ZOCOR) 5 MG tablet Take 10 mg by mouth at bedtime.    Yes Historical Provider, MD  OXYGEN Inhale 2 L into the lungs continuous.    Historical  Provider, MD   Physical Exam: Filed Vitals:   02/12/16 1600 02/12/16 1630 02/12/16 1701 02/12/16 1740  BP: 126/77 137/75 123/65 119/53  Pulse: 142 141 138 125  Temp:  100.4 F (38 C) 99.7 F (37.6 C) 98.4 F (36.9 C)  TempSrc:  Oral Oral Oral  Resp: '23 21 24 25  '$ Height:    '5\' 4"'$  (1.626 m)  Weight:    52 kg (114 lb 10.2 oz)  SpO2: 100% 100% 100% 99%     General:  Awake and alert.  Oriented to person, place, time and situation but breathing is labored.  Head: [place]/AT  Eyes: PERRL bilaterally, conjunctiva are pink.  EOMI.  ENT: Mucous membranes are moist.  No nasal drainage.  Neck is supple.  Cardiovascular: Tachycardic but regular.  No LE edema.  Respiratory: Diminished bilaterally but no significant wheeze  GI: Abdomen is soft/NT/ND.  Bowel sounds are present.  No  guarding.  Skin: Very dry.  Musculoskeletal: Moves all four extremities spontaneously.  Psychiatric: Normal affect.  Neurologic: No focal deficits.  Labs on Admission:  Basic Metabolic Panel:  Recent Labs Lab 02/06/16 0820 02/12/16 1132  NA 139 131*  K 4.8 3.6  CL  --  93*  CO2 27 26  GLUCOSE 133 252*  BUN 9.7 14  CREATININE 0.8 0.70  CALCIUM 9.8 9.0  MG  --  1.2*   Liver Function Tests:  Recent Labs Lab 02/06/16 0820 02/12/16 1132  AST 15 17  ALT 12 19  ALKPHOS 78 67  BILITOT 0.35 1.1  PROT 7.5 6.5  ALBUMIN 3.2* 3.3*   CBC:  Recent Labs Lab 02/06/16 0820 02/12/16 1132  WBC 4.1 1.5*  NEUTROABS 3.6 0.6*  HGB 10.6* 10.8*  HCT 31.5* 31.9*  MCV 79.7 78.0  PLT 461* 363   BNP (last 3 results)  Recent Labs  04/21/15 2316  BNP 116.7*    Radiological Exams on Admission: Dg Chest 2 View  02/12/2016  CLINICAL DATA:  Increasing shortness of breath beginning 02/10/2016. Stage IV lung cancer. EXAM: CHEST  2 VIEW COMPARISON:  Chest x-ray 01/10/2016.  Chest CT 01/30/2016. FINDINGS: Large right paratracheal mass again noted as seen on CT. Right perihilar opacity also appears stable.  Probable postradiation changes in the medial left upper lobe, stable. Heart is normal size. No effusions. No acute bony abnormality. IMPRESSION: Stable right paratracheal mass and right perihilar opacity. Stable postradiation changes in the left upper lobe. Electronically Signed   By: Rolm Baptise M.D.   On: 02/12/2016 12:17   Ct Angio Chest Pe W/cm &/or Wo Cm  02/12/2016  CLINICAL DATA:  Shortness of breath, current history of lung cancer. EXAM: CT ANGIOGRAPHY CHEST CT ABDOMEN AND PELVIS WITH CONTRAST TECHNIQUE: Multidetector CT imaging of the chest was performed using the standard protocol during bolus administration of intravenous contrast. Multiplanar CT image reconstructions and MIPs were obtained to evaluate the vascular anatomy. Multidetector CT imaging of the abdomen and pelvis was performed using the standard protocol during bolus administration of intravenous contrast. CONTRAST:  100 mL of Isovue 370 intravenously. COMPARISON:  CT scan of January 30, 2016. FINDINGS: CTA CHEST FINDINGS There is no definite evidence of pulmonary embolus. There is no evidence of thoracic aortic dissection or aneurysm. Atherosclerosis of thoracic aorta is noted. No pneumothorax is noted. Stable atelectasis or scarring is seen involving the right middle lobe. No significant pleural effusion is noted. Stable left perihilar post radiation changes are noted. Mild biapical scarring is noted. 6 mm nodule is noted posteriorly in the right lower which was present on prior exam is concerning for metastatic disease. This is best seen on image number 45 of series 12. 6.4 x 4.5 cm right peritracheal mass is noted which may be slightly enlarged compared to prior exam. Right internal jugular Port-A-Cath is noted with tip in expected position of the SVC. Stable 9 mm precarinal lymph node is noted. 12 mm sub carinal lymph node is noted which is slightly enlarged compared to prior exam. CT ABDOMEN and PELVIS FINDINGS Moderate degenerative  disc disease is noted at L3-4 and L4-5. 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 No gallstones are noted. Staple enhancing abnormality is noted anteriorly in the right hepatic lobe most consistent with benign vascular abnormality. No other definite hepatic lesions are noted. The spleen and pancreas appear normal. Adrenal glands and kidneys appear normal. No hydronephrosis or renal obstruction is noted. Atherosclerosis  of abdominal aorta is noted without aneurysm formation. Uterus and ovaries are unremarkable. There is no evidence of bowel obstruction. No abnormal fluid collection is noted. Moderate urinary bladder distention is noted. There is no definite evidence of adenopathy seen in the abdomen or pelvis. Review of the MIP images confirms the above findings. IMPRESSION: No definite evidence of pulmonary embolus. Atherosclerosis of thoracic aorta is noted without aneurysm or dissection. Stable atelectasis or scarring seen in the right middle lobe. Stable left perihilar radiation changes. Mildly enlarged right peritracheal mass is noted consistent with malignancy or metastatic disease. Stable mediastinal adenopathy and right lower lobe pulmonary nodule is noted. Atherosclerosis of abdominal aorta without aneurysm formation. Moderate distention of urinary bladder is noted. No definite metastatic disease seen in the abdomen or pelvis. Electronically Signed   By: Marijo Conception, M.D.   On: 02/12/2016 14:25   Ct Abdomen Pelvis W Contrast  02/12/2016  CLINICAL DATA:  Shortness of breath, current history of lung cancer. EXAM: CT ANGIOGRAPHY CHEST CT ABDOMEN AND PELVIS WITH CONTRAST TECHNIQUE: Multidetector CT imaging of the chest was performed using the standard protocol during bolus administration of intravenous contrast. Multiplanar CT image reconstructions and MIPs were obtained to evaluate the vascular anatomy. Multidetector CT imaging of the abdomen and pelvis was performed using the  standard protocol during bolus administration of intravenous contrast. CONTRAST:  100 mL of Isovue 370 intravenously. COMPARISON:  CT scan of January 30, 2016. FINDINGS: CTA CHEST FINDINGS There is no definite evidence of pulmonary embolus. There is no evidence of thoracic aortic dissection or aneurysm. Atherosclerosis of thoracic aorta is noted. No pneumothorax is noted. Stable atelectasis or scarring is seen involving the right middle lobe. No significant pleural effusion is noted. Stable left perihilar post radiation changes are noted. Mild biapical scarring is noted. 6 mm nodule is noted posteriorly in the right lower which was present on prior exam is concerning for metastatic disease. This is best seen on image number 45 of series 12. 6.4 x 4.5 cm right peritracheal mass is noted which may be slightly enlarged compared to prior exam. Right internal jugular Port-A-Cath is noted with tip in expected position of the SVC. Stable 9 mm precarinal lymph node is noted. 12 mm sub carinal lymph node is noted which is slightly enlarged compared to prior exam. CT ABDOMEN and PELVIS FINDINGS Moderate degenerative disc disease is noted at L3-4 and L4-5. 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 No gallstones are noted. Staple enhancing abnormality is noted anteriorly in the right hepatic lobe most consistent with benign vascular abnormality. No other definite hepatic lesions are noted. The spleen and pancreas appear normal. Adrenal glands and kidneys appear normal. No hydronephrosis or renal obstruction is noted. Atherosclerosis of abdominal aorta is noted without aneurysm formation. Uterus and ovaries are unremarkable. There is no evidence of bowel obstruction. No abnormal fluid collection is noted. Moderate urinary bladder distention is noted. There is no definite evidence of adenopathy seen in the abdomen or pelvis. Review of the MIP images confirms the above findings. IMPRESSION: No definite  evidence of pulmonary embolus. Atherosclerosis of thoracic aorta is noted without aneurysm or dissection. Stable atelectasis or scarring seen in the right middle lobe. Stable left perihilar radiation changes. Mildly enlarged right peritracheal mass is noted consistent with malignancy or metastatic disease. Stable mediastinal adenopathy and right lower lobe pulmonary nodule is noted. Atherosclerosis of abdominal aorta without aneurysm formation. Moderate distention of urinary bladder is noted. No definite metastatic  disease seen in the abdomen or pelvis. Electronically Signed   By: Marijo Conception, M.D.   On: 02/12/2016 14:25    EKG: Independently reviewed. Sinus tachycardia.  Assessment/Plan Principal Problem:   Neutropenic fever (HCC) Active Problems:   COPD, moderate ([place])   Primary cancer of right upper lobe of lung (HCC)   Sinus tachycardia ([place])   HCAP (healthcare-associated pneumonia)  Admit to telemetry, observation status for now  Neutropenia with low grade fever documented in the ED --Agree with ED impression that this could be early sepsis.  Agree with broad spectrum IV antibiotics for now.  --Blood and urine cultures since she is neutropenic --Neutropenic precautions --CBC with diff in the AM  Sinus tachycardia --I believe it is multifactorial (early sepsis, dehydration, pain, respiratory distress) --Continue maintenance fluids for now --Liberal analgesics  Shortness of breath --She has a history of COPD and chronic steroid use so I will try low dose IV solumedrol and nebulizer treatments in addition to treating for possible HCAP.  However, I am concerned that symptoms are driven by her progressive lung cancer.  History of metastatic NSCLC, progression of disease despite prior therapies, just started new regimen of Cyramza/Taxotere --Will need to consult Dr. Julien Nordmann in the AM  Hypomagnesemia --Replacement ordered.  Repeat level in the AM.  Code Status: DNR Family  Communication: Husband and son at bedside Disposition Plan: To be determined based on response to initial therapy  Time spent: 45 minutes  [employer] [facility]  02/12/2016, 7:37 PM

## 2016-02-12 NOTE — Progress Notes (Signed)
[facility] sent message to Santiago Glad of [facility] to inform her of patient's admission.

## 2016-02-12 NOTE — ED Notes (Signed)
Critical i stat Result given to Dr Billy Fischer

## 2016-02-12 NOTE — Progress Notes (Signed)
Pharmacy Antibiotic Note  Destiny Harrison is a 75 y.o. female admitted on 02/12/2016 with increasing SOB and "lump in throat". Hx stage 4 lung cancer. Received chemotherapy 4/18 and Neulasta 4/20. WBCs are low, neutrophils are pending at this time. Pharmacy has been consulted for Cefepime and Vancomcyin dosing for suspected HCAP. First doses given in the ED.  Plan: Vancomycin '500mg'$  g IV every 12 hours.  Goal trough 15-20 mcg/mL.  Cefepime 2g IV q8h. Dosing for febrile neutropenia. Measure Vanc trough at steady state. Follow up renal fxn, culture results, and clinical course.   Height: '5\' 4"'$  (162.6 cm) Weight: 123 lb (55.792 kg) IBW/kg (Calculated) : 54.7  Temp (24hrs), Avg:99.4 F (37.4 C), Min:98.9 F (37.2 C), Max:99.8 F (37.7 C)   Recent Labs Lab 02/06/16 0820 02/06/16 0820 02/12/16 1132  WBC 4.1  --  1.5*  CREATININE  --  0.8  --   LATICACIDVEN  --   --  2.06*    Estimated Creatinine Clearance: 51.7 mL/min (by C-G formula based on Cr of 0.8).    Allergies  Allergen Reactions  . Shellfish Allergy Swelling    Antimicrobials this admission: Cefepime 4/24 >>  Vanc 4/24 >>   Dose adjustments this admission:  Microbiology results: 4/24 BCx: 4/24 UCx:    Thank you for allowing pharmacy to be a part of this patient's care.  Romeo Rabon, PharmD, pager [phone]. 02/12/2016,12:07 PM.

## 2016-02-12 NOTE — Progress Notes (Signed)
EDCM spoke to patient at bedside.  Patient reports she lives at home with her husband.  She reports she has a strong support system with family and neighbors who live near by.  Patient confirms she has home health services with  [facility] for a visiting RN only.  Patient reports she is able to complete her ADL's on her own.  She wears oxygen at home 2 liters supplied by [facility].  Patient has a wlaker, bedside commode, shower bench and guard rails in the bathroom at home.  Patient is currently on neutropenic precautions.  No further EDCM needs at this time.  EDCM received message back from [place] reporting that patient is no longer active with [facility].

## 2016-02-12 NOTE — ED Notes (Signed)
17:17 pt can go up.

## 2016-02-12 NOTE — ED Notes (Signed)
Destiny Harrison transferring pt to floor/bedside report.

## 2016-02-13 ENCOUNTER — Ambulatory Visit: Payer: Medicare Other

## 2016-02-13 ENCOUNTER — Other Ambulatory Visit: Payer: Medicare Other

## 2016-02-13 DIAGNOSIS — J449 Chronic obstructive pulmonary disease, unspecified: Secondary | ICD-10-CM | POA: Diagnosis not present

## 2016-02-13 DIAGNOSIS — J440 Chronic obstructive pulmonary disease with acute lower respiratory infection: Secondary | ICD-10-CM | POA: Diagnosis present

## 2016-02-13 DIAGNOSIS — G893 Neoplasm related pain (acute) (chronic): Secondary | ICD-10-CM | POA: Diagnosis present

## 2016-02-13 DIAGNOSIS — R0602 Shortness of breath: Secondary | ICD-10-CM | POA: Diagnosis not present

## 2016-02-13 DIAGNOSIS — Z9221 Personal history of antineoplastic chemotherapy: Secondary | ICD-10-CM | POA: Diagnosis not present

## 2016-02-13 DIAGNOSIS — J45909 Unspecified asthma, uncomplicated: Secondary | ICD-10-CM | POA: Diagnosis present

## 2016-02-13 DIAGNOSIS — C799 Secondary malignant neoplasm of unspecified site: Secondary | ICD-10-CM | POA: Diagnosis not present

## 2016-02-13 DIAGNOSIS — Z9981 Dependence on supplemental oxygen: Secondary | ICD-10-CM | POA: Diagnosis not present

## 2016-02-13 DIAGNOSIS — Z79891 Long term (current) use of opiate analgesic: Secondary | ICD-10-CM | POA: Diagnosis not present

## 2016-02-13 DIAGNOSIS — K5900 Constipation, unspecified: Secondary | ICD-10-CM | POA: Diagnosis present

## 2016-02-13 DIAGNOSIS — Y95 Nosocomial condition: Secondary | ICD-10-CM | POA: Diagnosis present

## 2016-02-13 DIAGNOSIS — Z79899 Other long term (current) drug therapy: Secondary | ICD-10-CM | POA: Diagnosis not present

## 2016-02-13 DIAGNOSIS — E860 Dehydration: Secondary | ICD-10-CM | POA: Diagnosis present

## 2016-02-13 DIAGNOSIS — Z7951 Long term (current) use of inhaled steroids: Secondary | ICD-10-CM | POA: Diagnosis not present

## 2016-02-13 DIAGNOSIS — C3411 Malignant neoplasm of upper lobe, right bronchus or lung: Secondary | ICD-10-CM | POA: Diagnosis present

## 2016-02-13 DIAGNOSIS — T380X5A Adverse effect of glucocorticoids and synthetic analogues, initial encounter: Secondary | ICD-10-CM | POA: Diagnosis not present

## 2016-02-13 DIAGNOSIS — J189 Pneumonia, unspecified organism: Secondary | ICD-10-CM | POA: Diagnosis not present

## 2016-02-13 DIAGNOSIS — J9621 Acute and chronic respiratory failure with hypoxia: Secondary | ICD-10-CM | POA: Diagnosis not present

## 2016-02-13 DIAGNOSIS — R5081 Fever presenting with conditions classified elsewhere: Secondary | ICD-10-CM | POA: Diagnosis not present

## 2016-02-13 DIAGNOSIS — Z87891 Personal history of nicotine dependence: Secondary | ICD-10-CM | POA: Diagnosis not present

## 2016-02-13 DIAGNOSIS — E785 Hyperlipidemia, unspecified: Secondary | ICD-10-CM | POA: Diagnosis present

## 2016-02-13 DIAGNOSIS — R739 Hyperglycemia, unspecified: Secondary | ICD-10-CM | POA: Diagnosis not present

## 2016-02-13 DIAGNOSIS — D709 Neutropenia, unspecified: Secondary | ICD-10-CM | POA: Diagnosis not present

## 2016-02-13 DIAGNOSIS — R0600 Dyspnea, unspecified: Secondary | ICD-10-CM | POA: Diagnosis not present

## 2016-02-13 DIAGNOSIS — Z66 Do not resuscitate: Secondary | ICD-10-CM | POA: Diagnosis present

## 2016-02-13 DIAGNOSIS — A419 Sepsis, unspecified organism: Secondary | ICD-10-CM | POA: Diagnosis not present

## 2016-02-13 DIAGNOSIS — Z91013 Allergy to seafood: Secondary | ICD-10-CM | POA: Diagnosis not present

## 2016-02-13 DIAGNOSIS — R000 Tachycardia, unspecified: Secondary | ICD-10-CM | POA: Diagnosis not present

## 2016-02-13 DIAGNOSIS — D649 Anemia, unspecified: Secondary | ICD-10-CM | POA: Diagnosis present

## 2016-02-13 DIAGNOSIS — Z7952 Long term (current) use of systemic steroids: Secondary | ICD-10-CM | POA: Diagnosis not present

## 2016-02-13 DIAGNOSIS — C3490 Malignant neoplasm of unspecified part of unspecified bronchus or lung: Secondary | ICD-10-CM | POA: Diagnosis not present

## 2016-02-13 DIAGNOSIS — Z923 Personal history of irradiation: Secondary | ICD-10-CM | POA: Diagnosis not present

## 2016-02-13 LAB — CBC WITH DIFFERENTIAL/PLATELET
Basophils Absolute: 0 10*3/uL (ref 0.0–0.1)
Basophils Relative: 1 %
EOS PCT: 0 %
Eosinophils Absolute: 0 10*3/uL (ref 0.0–0.7)
HEMATOCRIT: 30.7 % — AB (ref 36.0–46.0)
HEMOGLOBIN: 10.4 g/dL — AB (ref 12.0–15.0)
LYMPHS PCT: 18 %
Lymphs Abs: 0.6 10*3/uL — ABNORMAL LOW (ref 0.7–4.0)
MCH: 26.2 pg (ref 26.0–34.0)
MCHC: 33.9 g/dL (ref 30.0–36.0)
MCV: 77.3 fL — AB (ref 78.0–100.0)
MONOS PCT: 27 %
Monocytes Absolute: 0.9 10*3/uL (ref 0.1–1.0)
NEUTROS PCT: 54 %
Neutro Abs: 2 10*3/uL (ref 1.7–7.7)
PLATELETS: 349 10*3/uL (ref 150–400)
RBC: 3.97 MIL/uL (ref 3.87–5.11)
RDW: 14.5 % (ref 11.5–15.5)
WBC: 3.5 10*3/uL — AB (ref 4.0–10.5)

## 2016-02-13 LAB — BASIC METABOLIC PANEL
ANION GAP: 6 (ref 5–15)
BUN: 10 mg/dL (ref 6–20)
CO2: 27 mmol/L (ref 22–32)
Calcium: 8.8 mg/dL — ABNORMAL LOW (ref 8.9–10.3)
Chloride: 105 mmol/L (ref 101–111)
Creatinine, Ser: 0.7 mg/dL (ref 0.44–1.00)
GFR calc Af Amer: >60 mL/min (ref >60)
GLUCOSE: 169 mg/dL — AB (ref 65–99)
POTASSIUM: 4.2 mmol/L (ref 3.5–5.1)
Sodium: 138 mmol/L (ref 135–145)

## 2016-02-13 LAB — TROPONIN I

## 2016-02-13 LAB — MAGNESIUM: MAGNESIUM: 2 mg/dL (ref 1.7–2.4)

## 2016-02-13 LAB — GLUCOSE, CAPILLARY: Glucose-Capillary: 224 mg/dL — ABNORMAL HIGH (ref 65–99)

## 2016-02-13 MED ORDER — SODIUM CHLORIDE 0.9 % IV SOLN
INTRAVENOUS | Status: AC
  Administered 2016-02-13: 17:00:00 via INTRAVENOUS

## 2016-02-13 MED ORDER — METHYLPREDNISOLONE SODIUM SUCC 125 MG IJ SOLR
60.0000 mg | Freq: Two times a day (BID) | INTRAMUSCULAR | Status: DC
  Administered 2016-02-13 – 2016-02-14 (×2): 60 mg via INTRAVENOUS
  Filled 2016-02-13 (×2): qty 2

## 2016-02-13 NOTE — Progress Notes (Addendum)
PROGRESS NOTE  Destiny Harrison  EAV:[id number] DOB: 13-Dec-1940  DOA: 02/12/2016 PCP: Thressa Sheller, MD  Outpatient Specialists:  Medical Oncology: Dr. Curt Bears Pulmonology: Dr. Brand Males  Brief Narrative:  75 year old female with history of metastatic NSCLC, diagnosed in 2013, who has disease progression despite prior radiation and chemotherapy, started new palliative chemotherapy regimen last week, COPD/asthma, chronic respiratory failure on home oxygen 2 L/m, HLD, presented to the ED with 1 day history of progressive dyspnea, has chronic mid substernal chest pain >1 year, some lightheadedness, decreased appetite, nausea, dry heaves and intermittent abdominal pain which improved after a large BM in the hospital. No fevers. CT chest, abdomen and pelvis in ED ruled out PE. Admitted for neutropenic low-grade fever, early sepsis secondary to possible HCAP.   Assessment & Plan:   Principal Problem:   Neutropenic fever ([place]) Active Problems:   COPD, moderate ([place])   Primary cancer of right upper lobe of lung (HCC)   Sinus tachycardia (HCC)   HCAP (healthcare-associated pneumonia)   Neutropenic fever/early sepsis/possible HCAP - Blood cultures 2: Negative to date. Urine microscopy: Not suggestive of UTI. Neutropenia has improved. - Treated per sepsis protocol with IV fluids and broad-spectrum IV antibiotics. Continue antibiotics. - Sepsis physiology improved.  Sinus tachycardia - Multifactorial related to early sepsis, dehydration, pain and dyspnea - No arrhythmias on telemetry. Treat underlying cause. Monitor. - Continue diltiazem-unclear why she is on this.  Acute on chronic hypoxic respiratory failure/COPD - Multifactorial related to COPD, lung cancer/mass and possible healthcare associated pneumonia - Treated with oxygen, IV Solu-Medrol, broad-spectrum IV antibiotics and bronchodilators. - Improving. Reduce IV Solu-Medrol. - CTA chest: No obvious pneumonia reported. CT  abdomen and pelvis without definitive metastatic disease. Pro calcitonin elevated at 1.02.  Constipation - Bowel regimen. Had multiple BMs since admission.  Hypomagnesemia -Replaced.   Metastatic non-small cell lung cancer/chronic chest pain related to malignancy - Progression despite prior therapies. Just started new palliative chemotherapy regimen of Cyramza/Taxotere - We will alert Dr. Julien Nordmann the patient's hospital admission via Epic and defer palliative care consultation to him during outpatient follow-up.  Hyperlipidemia - Continue statins.  Anemia - Stable. Follow CBCs. Transfuse if hemoglobin <7 g per DL.  Hypoglycemia - Monitor CBGs. Check A1c. Consider SSI if persistently elevated CBGs.   DVT prophylaxis: Lovenox  Code Status: DO NOT RESUSCITATE  Family Communication: Discussed in detail with patient. No family at bedside.  Disposition Plan: DC home when medically stable.  Consultants:   None   Procedures:   None  Antimicrobials:   IV cefepime 4/24 >  IV vancomycin 4/24 >    Subjective: Feels much per her. Had 4-5 large BM since admission. No abdominal pain. Reports chronic lower midsternal chest pain ongoing for >1 year and indicates that it is due to her cancer. Dyspnea improved. No cough reported.  Objective:  Filed Vitals:   02/12/16 2025 02/13/16 0606 02/13/16 0854 02/13/16 1005  BP: 123/62 123/71  115/54  Pulse: 124 103    Temp: 97.8 F (36.6 C) 97.9 F (36.6 C)    TempSrc: Oral Oral    Resp: 24 22    Height:      Weight:      SpO2: 99% 99% 98%     Intake/Output Summary (Last 24 hours) at 02/13/16 1147 Last data filed at 02/13/16 0500  Gross per 24 hour  Intake   1480 ml  Output   1340 ml  Net    140 ml   [employer]  02/12/16 1113 02/12/16 1149 02/12/16 1740  Weight: 55.792 kg (123 lb) 55.792 kg (123 lb) 52 kg (114 lb 10.2 oz)    Examination:  General exam: Pleasant elderly female lying comfortably propped up in bed.  Appears calm and comfortable  Respiratory system: Reduced breath sounds diffusely and more so in the bases. Occasional expiratory rhonchi. No obvious crackles. Respiratory effort normal. Cardiovascular system: S1 & S2 heard,mild regular tachycardia. No JVD, murmurs, rubs, gallops or clicks. No pedal edema. telemetry: Sinus tachycardia in the 110s-120s.  Gastrointestinal system: Abdomen is nondistended, soft and nontender. No organomegaly or masses felt. Normal bowel sounds heard. Central nervous system: Alert and oriented. No focal neurological deficits. Extremities: Symmetric 5 x 5 power. Skin: No rashes, lesions or ulcers Psychiatry: Judgement and insight appear normal. Mood & affect appropriate.     Data Reviewed: I have personally reviewed following labs and imaging studies  CBC:  Recent Labs Lab 02/12/16 1132 02/13/16 0121  WBC 1.5* 3.5*  NEUTROABS 0.6* 2.0  HGB 10.8* 10.4*  HCT 31.9* 30.7*  MCV 78.0 77.3*  PLT 363 706   Basic Metabolic Panel:  Recent Labs Lab 02/12/16 1132 02/13/16 0121  NA 131* 138  K 3.6 4.2  CL 93* 105  CO2 26 27  GLUCOSE 252* 169*  BUN 14 10  CREATININE 0.70 0.70  CALCIUM 9.0 8.8*  MG 1.2* 2.0   GFR: Estimated Creatinine Clearance: 49.1 mL/min (by C-G formula based on Cr of 0.7). Liver Function Tests:  Recent Labs Lab 02/12/16 1132  AST 17  ALT 19  ALKPHOS 67  BILITOT 1.1  PROT 6.5  ALBUMIN 3.3*   No results for input(s): LIPASE, AMYLASE in the last 168 hours. No results for input(s): AMMONIA in the last 168 hours. Coagulation Profile:  Recent Labs Lab 02/12/16 1920  INR 1.23   Cardiac Enzymes:  Recent Labs Lab 02/12/16 1920 02/13/16 0121  TROPONINI 0.03 <0.03   BNP (last 3 results) No results for input(s): PROBNP in the last 8760 hours. HbA1C: No results for input(s): HGBA1C in the last 72 hours. CBG: No results for input(s): GLUCAP in the last 168 hours. Lipid Profile: No results for input(s): CHOL, HDL,  LDLCALC, TRIG, CHOLHDL, LDLDIRECT in the last 72 hours. Thyroid Function Tests: No results for input(s): TSH, T4TOTAL, FREET4, T3FREE, THYROIDAB in the last 72 hours. Anemia Panel: No results for input(s): VITAMINB12, FOLATE, FERRITIN, TIBC, IRON, RETICCTPCT in the last 72 hours. Urine analysis:    Component Value Date/Time   COLORURINE YELLOW 02/12/2016 [place] 02/12/2016 1407   LABSPEC 1.015 02/12/2016 1407   PHURINE 7.0 02/12/2016 1407   GLUCOSEU NEGATIVE 02/12/2016 1407   HGBUR NEGATIVE 02/12/2016 1407   BILIRUBINUR NEGATIVE 02/12/2016 1407   KETONESUR NEGATIVE 02/12/2016 1407   PROTEINUR NEGATIVE 02/12/2016 1407   UROBILINOGEN 0.2 08/18/2015 0730   NITRITE NEGATIVE 02/12/2016 1407   LEUKOCYTESUR NEGATIVE 02/12/2016 1407    Recent Results (from the past 240 hour(s))  Culture, blood (routine x 2)     Status: None (Preliminary result)   Collection Time: 02/12/16 11:20 AM  Result Value Ref Range Status   Specimen Description BLOOD PORTA CATH  Final   Special Requests BOTTLES DRAWN AEROBIC AND ANAEROBIC 5ML  Final   Culture   Final    NO GROWTH < 24 HOURS Performed at [facility]    Report Status PENDING  Incomplete  Culture, blood (routine x 2)     Status: None (Preliminary result)  Collection Time: 02/12/16 11:47 AM  Result Value Ref Range Status   Specimen Description BLOOD RIGHT ANTECUBITAL  Final   Special Requests BOTTLES DRAWN AEROBIC AND ANAEROBIC 5ML  Final   Culture   Final    NO GROWTH < 24 HOURS Performed at [facility]    Report Status PENDING  Incomplete         Radiology Studies: Dg Chest 2 View  02/12/2016  CLINICAL DATA:  Increasing shortness of breath beginning 02/10/2016. Stage IV lung cancer. EXAM: CHEST  2 VIEW COMPARISON:  Chest x-ray 01/10/2016.  Chest CT 01/30/2016. FINDINGS: Large right paratracheal mass again noted as seen on CT. Right perihilar opacity also appears stable. Probable postradiation changes  in the medial left upper lobe, stable. Heart is normal size. No effusions. No acute bony abnormality. IMPRESSION: Stable right paratracheal mass and right perihilar opacity. Stable postradiation changes in the left upper lobe. Electronically Signed   By: Rolm Baptise M.D.   On: 02/12/2016 12:17   Ct Angio Chest Pe W/cm &/or Wo Cm  02/12/2016  CLINICAL DATA:  Shortness of breath, current history of lung cancer. EXAM: CT ANGIOGRAPHY CHEST CT ABDOMEN AND PELVIS WITH CONTRAST TECHNIQUE: Multidetector CT imaging of the chest was performed using the standard protocol during bolus administration of intravenous contrast. Multiplanar CT image reconstructions and MIPs were obtained to evaluate the vascular anatomy. Multidetector CT imaging of the abdomen and pelvis was performed using the standard protocol during bolus administration of intravenous contrast. CONTRAST:  100 mL of Isovue 370 intravenously. COMPARISON:  CT scan of January 30, 2016. FINDINGS: CTA CHEST FINDINGS There is no definite evidence of pulmonary embolus. There is no evidence of thoracic aortic dissection or aneurysm. Atherosclerosis of thoracic aorta is noted. No pneumothorax is noted. Stable atelectasis or scarring is seen involving the right middle lobe. No significant pleural effusion is noted. Stable left perihilar post radiation changes are noted. Mild biapical scarring is noted. 6 mm nodule is noted posteriorly in the right lower which was present on prior exam is concerning for metastatic disease. This is best seen on image number 45 of series 12. 6.4 x 4.5 cm right peritracheal mass is noted which may be slightly enlarged compared to prior exam. Right internal jugular Port-A-Cath is noted with tip in expected position of the SVC. Stable 9 mm precarinal lymph node is noted. 12 mm sub carinal lymph node is noted which is slightly enlarged compared to prior exam. CT ABDOMEN and PELVIS FINDINGS Moderate degenerative disc disease is noted at L3-4 and  L4-5. 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 No gallstones are noted. Staple enhancing abnormality is noted anteriorly in the right hepatic lobe most consistent with benign vascular abnormality. No other definite hepatic lesions are noted. The spleen and pancreas appear normal. Adrenal glands and kidneys appear normal. No hydronephrosis or renal obstruction is noted. Atherosclerosis of abdominal aorta is noted without aneurysm formation. Uterus and ovaries are unremarkable. There is no evidence of bowel obstruction. No abnormal fluid collection is noted. Moderate urinary bladder distention is noted. There is no definite evidence of adenopathy seen in the abdomen or pelvis. Review of the MIP images confirms the above findings. IMPRESSION: No definite evidence of pulmonary embolus. Atherosclerosis of thoracic aorta is noted without aneurysm or dissection. Stable atelectasis or scarring seen in the right middle lobe. Stable left perihilar radiation changes. Mildly enlarged right peritracheal mass is noted consistent with malignancy or metastatic disease. Stable mediastinal adenopathy  and right lower lobe pulmonary nodule is noted. Atherosclerosis of abdominal aorta without aneurysm formation. Moderate distention of urinary bladder is noted. No definite metastatic disease seen in the abdomen or pelvis. Electronically Signed   By: Marijo Conception, M.D.   On: 02/12/2016 14:25   Ct Abdomen Pelvis W Contrast  02/12/2016  CLINICAL DATA:  Shortness of breath, current history of lung cancer. EXAM: CT ANGIOGRAPHY CHEST CT ABDOMEN AND PELVIS WITH CONTRAST TECHNIQUE: Multidetector CT imaging of the chest was performed using the standard protocol during bolus administration of intravenous contrast. Multiplanar CT image reconstructions and MIPs were obtained to evaluate the vascular anatomy. Multidetector CT imaging of the abdomen and pelvis was performed using the standard protocol during bolus  administration of intravenous contrast. CONTRAST:  100 mL of Isovue 370 intravenously. COMPARISON:  CT scan of January 30, 2016. FINDINGS: CTA CHEST FINDINGS There is no definite evidence of pulmonary embolus. There is no evidence of thoracic aortic dissection or aneurysm. Atherosclerosis of thoracic aorta is noted. No pneumothorax is noted. Stable atelectasis or scarring is seen involving the right middle lobe. No significant pleural effusion is noted. Stable left perihilar post radiation changes are noted. Mild biapical scarring is noted. 6 mm nodule is noted posteriorly in the right lower which was present on prior exam is concerning for metastatic disease. This is best seen on image number 45 of series 12. 6.4 x 4.5 cm right peritracheal mass is noted which may be slightly enlarged compared to prior exam. Right internal jugular Port-A-Cath is noted with tip in expected position of the SVC. Stable 9 mm precarinal lymph node is noted. 12 mm sub carinal lymph node is noted which is slightly enlarged compared to prior exam. CT ABDOMEN and PELVIS FINDINGS Moderate degenerative disc disease is noted at L3-4 and L4-5. 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 No gallstones are noted. Staple enhancing abnormality is noted anteriorly in the right hepatic lobe most consistent with benign vascular abnormality. No other definite hepatic lesions are noted. The spleen and pancreas appear normal. Adrenal glands and kidneys appear normal. No hydronephrosis or renal obstruction is noted. Atherosclerosis of abdominal aorta is noted without aneurysm formation. Uterus and ovaries are unremarkable. There is no evidence of bowel obstruction. No abnormal fluid collection is noted. Moderate urinary bladder distention is noted. There is no definite evidence of adenopathy seen in the abdomen or pelvis. Review of the MIP images confirms the above findings. IMPRESSION: No definite evidence of pulmonary embolus.  Atherosclerosis of thoracic aorta is noted without aneurysm or dissection. Stable atelectasis or scarring seen in the right middle lobe. Stable left perihilar radiation changes. Mildly enlarged right peritracheal mass is noted consistent with malignancy or metastatic disease. Stable mediastinal adenopathy and right lower lobe pulmonary nodule is noted. Atherosclerosis of abdominal aorta without aneurysm formation. Moderate distention of urinary bladder is noted. No definite metastatic disease seen in the abdomen or pelvis. Electronically Signed   By: Marijo Conception, M.D.   On: 02/12/2016 14:25        Scheduled Meds: . budesonide  0.25 mg Nebulization BID  . calcium-vitamin D  1 tablet Oral Daily  . ceFEPime (MAXIPIME) IV  2 g Intravenous Q8H  . diltiazem  180 mg Oral Daily  . enoxaparin (LOVENOX) injection  40 mg Subcutaneous Q24H  . famotidine  20 mg Oral BID  . folic acid  269 mcg Oral Daily  . guaiFENesin  600 mg Oral BID  .  ipratropium-albuterol  3 mL Nebulization Q4H WA  . magnesium oxide  400 mg Oral BID  . methylPREDNISolone (SOLU-MEDROL) injection  60 mg Intravenous Q6H  . morphine  15 mg Oral Q12H  . simvastatin  10 mg Oral QHS  . sodium chloride flush  10-40 mL Intracatheter Q12H  . sodium chloride flush  3 mL Intravenous Q12H  . vancomycin  500 mg Intravenous Q12H   Continuous Infusions: . sodium chloride 100 mL/hr at 02/13/16 0446        Time spent: 40 minutes.    [facility], MD [facility] Pager [phone]  If 7PM-7AM, please contact night-coverage [email or web address] Password [facility] 02/13/2016, 11:47 AM

## 2016-02-13 NOTE — Care Management Obs Status (Signed)
[place] NOTIFICATION   Patient Details  Name: Destiny Harrison MRN: [id number] Date of Birth: [birth year]   Medicare Observation Status Notification Given:  Yes    Purcell Mouton, RN 02/13/2016, 3:58 PM

## 2016-02-14 LAB — CBC WITH DIFFERENTIAL/PLATELET
BAND NEUTROPHILS: 23 %
BLASTS: 0 %
Basophils Absolute: 0 10*3/uL (ref 0.0–0.1)
Basophils Relative: 0 %
EOS ABS: 0 10*3/uL (ref 0.0–0.7)
Eosinophils Relative: 0 %
HEMATOCRIT: 26 % — AB (ref 36.0–46.0)
HEMOGLOBIN: 8.9 g/dL — AB (ref 12.0–15.0)
LYMPHS PCT: 7 %
Lymphs Abs: 2.3 10*3/uL (ref 0.7–4.0)
MCH: 26.6 pg (ref 26.0–34.0)
MCHC: 34.2 g/dL (ref 30.0–36.0)
MCV: 77.6 fL — AB (ref 78.0–100.0)
MONOS PCT: 7 %
Metamyelocytes Relative: 4 %
Monocytes Absolute: 2.3 10*3/uL — ABNORMAL HIGH (ref 0.1–1.0)
Myelocytes: 2 %
NEUTROS ABS: 27.6 10*3/uL — AB (ref 1.7–7.7)
NEUTROS PCT: 57 %
NRBC: 0 /100{WBCs}
OTHER: 0 %
Platelets: 398 10*3/uL (ref 150–400)
Promyelocytes Absolute: 0 %
RBC: 3.35 MIL/uL — ABNORMAL LOW (ref 3.87–5.11)
RDW: 15.1 % (ref 11.5–15.5)
WBC Morphology: INCREASED
WBC: 32.2 10*3/uL — ABNORMAL HIGH (ref 4.0–10.5)

## 2016-02-14 LAB — BASIC METABOLIC PANEL
Anion gap: 11 (ref 5–15)
BUN: 13 mg/dL (ref 6–20)
CHLORIDE: 105 mmol/L (ref 101–111)
CO2: 24 mmol/L (ref 22–32)
CREATININE: 0.68 mg/dL (ref 0.44–1.00)
Calcium: 8.7 mg/dL — ABNORMAL LOW (ref 8.9–10.3)
GFR calc Af Amer: >60 mL/min (ref >60)
GFR calc non Af Amer: >60 mL/min (ref >60)
GLUCOSE: 187 mg/dL — AB (ref 65–99)
Potassium: 3.9 mmol/L (ref 3.5–5.1)
SODIUM: 140 mmol/L (ref 135–145)

## 2016-02-14 LAB — URINE CULTURE

## 2016-02-14 LAB — GLUCOSE, CAPILLARY: Glucose-Capillary: 191 mg/dL — ABNORMAL HIGH (ref 65–99)

## 2016-02-14 LAB — HEMOGLOBIN A1C
Hgb A1c MFr Bld: 6.6 % — ABNORMAL HIGH (ref 4.8–5.6)
MEAN PLASMA GLUCOSE: 143 mg/dL

## 2016-02-14 MED ORDER — IPRATROPIUM-ALBUTEROL 0.5-2.5 (3) MG/3ML IN SOLN
3.0000 mL | Freq: Four times a day (QID) | RESPIRATORY_TRACT | Status: DC
  Administered 2016-02-14 – 2016-02-18 (×16): 3 mL via RESPIRATORY_TRACT
  Filled 2016-02-14 (×16): qty 3

## 2016-02-14 MED ORDER — METHYLPREDNISOLONE SODIUM SUCC 40 MG IJ SOLR
40.0000 mg | INTRAMUSCULAR | Status: DC
  Administered 2016-02-15: 40 mg via INTRAVENOUS
  Filled 2016-02-14: qty 1

## 2016-02-14 NOTE — Progress Notes (Signed)
PROGRESS NOTE  Destiny Harrison  XNA:[id number] DOB: 07-Oct-1941  DOA: 02/12/2016 PCP: Thressa Sheller, MD  Outpatient Specialists:  Medical Oncology: Dr. Curt Bears Pulmonology: Dr. Brand Males  Brief Narrative:  75 year old female with history of metastatic NSCLC, diagnosed in 2013, who has disease progression despite prior radiation and chemotherapy, started new palliative chemotherapy regimen last week, COPD/asthma, chronic respiratory failure on home oxygen 2 L/m, HLD, presented to the ED with 1 day history of progressive dyspnea, has chronic mid substernal chest pain >1 year, some lightheadedness, decreased appetite, nausea, dry heaves and intermittent abdominal pain which improved after a large BM in the hospital. No fevers. CT chest, abdomen and pelvis in ED ruled out PE. Admitted for neutropenic low-grade fever, early sepsis secondary to possible HCAP.   Assessment & Plan:   Principal Problem:   Neutropenic fever ([place]) Active Problems:   COPD, moderate ([place])   Primary cancer of right upper lobe of lung (HCC)   Sinus tachycardia (HCC)   HCAP (healthcare-associated pneumonia)   Neutropenic fever/early sepsis/possible HCAP - Blood cultures 2: Negative to date. Urine microscopy: Not suggestive of UTI. Neutropenia has improved. - Treated per sepsis protocol with IV fluids and broad-spectrum IV antibiotics. Continue antibiotics. -now with leukocytosis, suspect related to nelusta and steroids.   Sinus tachycardia - Multifactorial related to early sepsis, dehydration, pain and dyspnea - No arrhythmias on telemetry. Treat underlying cause. Monitor. - Continue diltiazem-unclear why she is on this.  Acute on chronic hypoxic respiratory failure/COPD - Multifactorial related to COPD, lung cancer/mass and possible healthcare associated pneumonia - Treated with oxygen, IV Solu-Medrol, broad-spectrum IV antibiotics and bronchodilators. - Improving. Reduce IV Solu-Medrol. - CTA  chest: No obvious pneumonia reported. CT abdomen and pelvis without definitive metastatic disease. Pro calcitonin elevated at 1.02. -taper steroids. Continue with IV antibiotics.   Constipation - Bowel regimen. Had multiple BMs since admission.  Hypomagnesemia -Replaced.   Metastatic non-small cell lung cancer/chronic chest pain related to malignancy - Progression despite prior therapies. Just started new palliative chemotherapy regimen of Cyramza/Taxotere -Follows with Dr Julien Nordmann.   Hyperlipidemia - Continue statins.  Anemia - Stable. Follow CBCs. Transfuse if hemoglobin <7 g per DL.  Hypoglycemia - Monitor CBGs. Check A1c. Consider SSI if persistently elevated CBGs.   DVT prophylaxis: Lovenox  Code Status: DO NOT RESUSCITATE  Family Communication: Discussed in detail with patient. No family at bedside.  Disposition Plan: DC home when medically stable. In 24 to 48 hours.    Consultants:   None   Procedures:   None  Antimicrobials:   IV cefepime 4/24 >  IV vancomycin 4/24 >    Subjective: Feeling better, dyspnea has improved.   Objective:  Filed Vitals:   02/14/16 0750 02/14/16 0753 02/14/16 1226 02/14/16 1403  BP:    142/61  Pulse:    100  Temp:    97.8 F (36.6 C)  TempSrc:    Oral  Resp:    20  Height:      Weight:      SpO2: 97% 97% 99% 100%    Intake/Output Summary (Last 24 hours) at 02/14/16 1507 Last data filed at 02/14/16 1105  Gross per 24 hour  Intake 561.67 ml  Output   2325 ml  Net -1763.33 ml   Filed Weights   02/12/16 1113 02/12/16 1149 02/12/16 1740  Weight: 55.792 kg (123 lb) 55.792 kg (123 lb) 52 kg (114 lb 10.2 oz)    Examination:  General exam: Pleasant elderly female lying comfortably propped  up in bed. Appears calm and comfortable  Respiratory system: no wheezing.  Cardiovascular system: S1 & S2 heard,mild regular tachycardia. No JVD, murmurs, rubs, gallops or clicks. No pedal edema. telemetry: Sinus tachycardia in the  110s-120s.  Gastrointestinal system: Abdomen is nondistended, soft and nontender. No organomegaly or masses felt. Normal bowel sounds heard. Central nervous system: Alert and oriented. No focal neurological deficits. Extremities: Symmetric 5 x 5 power.     Data Reviewed: I have personally reviewed following labs and imaging studies  CBC:  Recent Labs Lab 02/12/16 1132 02/13/16 0121 02/14/16 0440  WBC 1.5* 3.5* 32.2*  NEUTROABS 0.6* 2.0 27.6*  HGB 10.8* 10.4* 8.9*  HCT 31.9* 30.7* 26.0*  MCV 78.0 77.3* 77.6*  PLT 363 349 546   Basic Metabolic Panel:  Recent Labs Lab 02/12/16 1132 02/13/16 0121 02/14/16 0440  NA 131* 138 140  K 3.6 4.2 3.9  CL 93* 105 105  CO2 '26 27 24  '$ GLUCOSE 252* 169* 187*  BUN '14 10 13  '$ CREATININE 0.70 0.70 0.68  CALCIUM 9.0 8.8* 8.7*  MG 1.2* 2.0  --    GFR: Estimated Creatinine Clearance: 49.1 mL/min (by C-G formula based on Cr of 0.68). Liver Function Tests:  Recent Labs Lab 02/12/16 1132  AST 17  ALT 19  ALKPHOS 67  BILITOT 1.1  PROT 6.5  ALBUMIN 3.3*   No results for input(s): LIPASE, AMYLASE in the last 168 hours. No results for input(s): AMMONIA in the last 168 hours. Coagulation Profile:  Recent Labs Lab 02/12/16 1920  INR 1.23   Cardiac Enzymes:  Recent Labs Lab 02/12/16 1920 02/13/16 0121 02/13/16 0845  TROPONINI 0.03 <0.03 <0.03   BNP (last 3 results) No results for input(s): PROBNP in the last 8760 hours. HbA1C:  Recent Labs  02/13/16 0129  HGBA1C 6.6*   CBG:  Recent Labs Lab 02/13/16 1709  GLUCAP 224*   Lipid Profile: No results for input(s): CHOL, HDL, LDLCALC, TRIG, CHOLHDL, LDLDIRECT in the last 72 hours. Thyroid Function Tests: No results for input(s): TSH, T4TOTAL, FREET4, T3FREE, THYROIDAB in the last 72 hours. Anemia Panel: No results for input(s): VITAMINB12, FOLATE, FERRITIN, TIBC, IRON, RETICCTPCT in the last 72 hours. Urine analysis:    Component Value Date/Time   COLORURINE  YELLOW 02/12/2016 1407   APPEARANCEUR CLEAR 02/12/2016 1407   LABSPEC 1.015 02/12/2016 1407   PHURINE 7.0 02/12/2016 1407   GLUCOSEU NEGATIVE 02/12/2016 1407   HGBUR NEGATIVE 02/12/2016 1407   [place] 02/12/2016 1407   KETONESUR NEGATIVE 02/12/2016 1407   PROTEINUR NEGATIVE 02/12/2016 1407   UROBILINOGEN 0.2 08/18/2015 0730   NITRITE NEGATIVE 02/12/2016 1407   [place] 02/12/2016 1407    Recent Results (from the past 240 hour(s))  Culture, blood (routine x 2)     Status: None (Preliminary result)   Collection Time: 02/12/16 11:20 AM  Result Value Ref Range Status   Specimen Description BLOOD PORTA CATH  Final   Special Requests BOTTLES DRAWN AEROBIC AND ANAEROBIC 5ML  Final   Culture   Final    NO GROWTH < 24 HOURS Performed at [facility]    Report Status PENDING  Incomplete  Culture, blood (routine x 2)     Status: None (Preliminary result)   Collection Time: 02/12/16 11:47 AM  Result Value Ref Range Status   Specimen Description BLOOD RIGHT ANTECUBITAL  Final   Special Requests BOTTLES DRAWN AEROBIC AND ANAEROBIC 5ML  Final   Culture   Final  NO GROWTH < 24 HOURS Performed at [facility]    Report Status PENDING  Incomplete  Urine culture     Status: Abnormal   Collection Time: 02/12/16  2:07 PM  Result Value Ref Range Status   Specimen Description URINE, CLEAN CATCH  Final   Special Requests NONE  Final   Culture (A)  Final    9,000 COLONIES/mL INSIGNIFICANT GROWTH Performed at [facility]    Report Status 02/14/2016 FINAL  Final         Radiology Studies: No results found.      Scheduled Meds: . budesonide  0.25 mg Nebulization BID  . calcium-vitamin D  1 tablet Oral Daily  . ceFEPime (MAXIPIME) IV  2 g Intravenous Q8H  . diltiazem  180 mg Oral Daily  . enoxaparin (LOVENOX) injection  40 mg Subcutaneous Q24H  . famotidine  20 mg Oral BID  . folic acid  201 mcg Oral Daily  . guaiFENesin  600 mg  Oral BID  . ipratropium-albuterol  3 mL Nebulization QID  . magnesium oxide  400 mg Oral BID  . [START ON 02/15/2016] methylPREDNISolone (SOLU-MEDROL) injection  40 mg Intravenous Q24H  . morphine  15 mg Oral Q12H  . simvastatin  10 mg Oral QHS  . sodium chloride flush  10-40 mL Intracatheter Q12H  . sodium chloride flush  3 mL Intravenous Q12H  . vancomycin  500 mg Intravenous Q12H   Continuous Infusions:     LOS: 1 day    Time spent: 35 minutes.    Elmarie Shiley, MD [facility] Pager [phone]  If 7PM-7AM, please contact night-coverage [email or web address] Password [facility] 02/14/2016, 3:07 PM

## 2016-02-15 ENCOUNTER — Other Ambulatory Visit: Payer: Medicare Other

## 2016-02-15 ENCOUNTER — Ambulatory Visit: Payer: Medicare Other

## 2016-02-15 ENCOUNTER — Ambulatory Visit: Payer: Medicare Other | Admitting: Internal Medicine

## 2016-02-15 LAB — CBC
HEMATOCRIT: 26.3 % — AB (ref 36.0–46.0)
HEMOGLOBIN: 9 g/dL — AB (ref 12.0–15.0)
MCH: 26.8 pg (ref 26.0–34.0)
MCHC: 34.2 g/dL (ref 30.0–36.0)
MCV: 78.3 fL (ref 78.0–100.0)
Platelets: 401 10*3/uL — ABNORMAL HIGH (ref 150–400)
RBC: 3.36 MIL/uL — AB (ref 3.87–5.11)
RDW: 15.4 % (ref 11.5–15.5)
WBC: 79.7 10*3/uL (ref 4.0–10.5)

## 2016-02-15 LAB — GLUCOSE, CAPILLARY
GLUCOSE-CAPILLARY: 218 mg/dL — AB (ref 65–99)
Glucose-Capillary: 148 mg/dL — ABNORMAL HIGH (ref 65–99)
Glucose-Capillary: 142 mg/dL — ABNORMAL HIGH (ref 65–99)

## 2016-02-15 MED ORDER — DEXTROSE 5 % IV SOLN
2.0000 g | Freq: Two times a day (BID) | INTRAVENOUS | Status: DC
  Administered 2016-02-15 – 2016-02-18 (×6): 2 g via INTRAVENOUS
  Filled 2016-02-15 (×7): qty 2

## 2016-02-15 MED ORDER — SODIUM CHLORIDE 0.9 % IV SOLN
INTRAVENOUS | Status: DC
  Administered 2016-02-15 – 2016-02-18 (×6): via INTRAVENOUS

## 2016-02-15 MED ORDER — LIVING WELL WITH DIABETES BOOK
Freq: Once | Status: DC
  Filled 2016-02-15: qty 1

## 2016-02-15 NOTE — Progress Notes (Signed)
CRITICAL VALUE ALERT  Critical value received:  WBC 79.7  Date of notification:  02/15/2016  Time of notification:  0815  Critical value read back:Yes.    Nurse who received alert:  K. Hardin Negus RN   MD notified (1st page):  [place]  Time of first page: 0820  MD notified (2nd page):  Time of second page:  Responding MD:  Tyrell Antonio  Time MD responded:  [phone]

## 2016-02-15 NOTE — Progress Notes (Signed)
PROGRESS NOTE  Destiny Harrison  PPI:[id number] DOB: [birth year]  DOA: 02/12/2016 PCP: Thressa Sheller, MD  Outpatient Specialists:  Medical Oncology: Dr. Curt Bears Pulmonology: Dr. Brand Males  Brief Narrative:  75 year old female with history of metastatic NSCLC, diagnosed in 2013, who has disease progression despite prior radiation and chemotherapy, started new palliative chemotherapy regimen last week, COPD/asthma, chronic respiratory failure on home oxygen 2 L/m, HLD, presented to the ED with 1 day history of progressive dyspnea, has chronic mid substernal chest pain >1 year, some lightheadedness, decreased appetite, nausea, dry heaves and intermittent abdominal pain which improved after a large BM in the hospital. No fevers. CT chest, abdomen and pelvis in ED ruled out PE. Admitted for neutropenic low-grade fever, early sepsis secondary to possible HCAP.   Assessment & Plan:   Principal Problem:   Neutropenic fever ([place]) Active Problems:   COPD, moderate ([place])   Primary cancer of right upper lobe of lung (HCC)   Sinus tachycardia (HCC)   HCAP (healthcare-associated pneumonia)   Neutropenic fever/early sepsis/possible HCAP - Blood cultures 2: Negative to date. Urine microscopy: Not suggestive of UTI. Neutropenia has resolved.  - Treated per sepsis protocol with IV fluids and broad-spectrum IV antibiotics. Continue antibiotics. -continue with IV antibiotics.   Leukocytosis, suspect related to nelusta and steroids.  IV fluids. Hold steroids.  Continue with IV antibiotics.   Sinus tachycardia - Multifactorial related to early sepsis, dehydration, pain and dyspnea - No arrhythmias on telemetry. Treat underlying cause. Monitor. - Continue diltiazem-  Acute on chronic hypoxic respiratory failure/COPD - Multifactorial related to COPD, lung cancer/mass and possible healthcare associated pneumonia - Treated with oxygen, IV Solu-Medrol, broad-spectrum IV antibiotics and  bronchodilators. - CTA chest: No obvious pneumonia reported. CT abdomen and pelvis without definitive metastatic disease. Pro calcitonin elevated at 1.02. - Continue with IV antibiotics.  -hold steroids, WBC at 79./   Constipation - Bowel regimen. Had multiple BMs since admission.  Hypomagnesemia -Replaced.   Metastatic non-small cell lung cancer/chronic chest pain related to malignancy - Progression despite prior therapies. Just started new palliative chemotherapy regimen of Cyramza/Taxotere -Follows with Dr Julien Nordmann.   Hyperlipidemia - Continue statins.  Anemia - Stable. Follow CBCs. Transfuse if hemoglobin <7 g per DL.  Hyperglycemia - Monitor CBGs.  A1c 6.6. Consider SSI if persistently elevated CBGs.   DVT prophylaxis: Lovenox  Code Status: DO NOT RESUSCITATE  Family Communication: Discussed in detail with patient. No family at bedside.  Disposition Plan: DC home when medically stable. In 24 to 48 hours.    Consultants:   None   Procedures:   None  Antimicrobials:   IV cefepime 4/24 >  IV vancomycin 4/24 >    Subjective: Feeling better, dyspnea has improved. Denies pain   Objective:  Filed Vitals:   02/14/16 1403 02/14/16 1952 02/14/16 2128 02/15/16 0502  BP: 142/61  131/54 135/69  Pulse: 100  102 106  Temp: 97.8 F (36.6 C)  98.3 F (36.8 C) 98.9 F (37.2 C)  TempSrc: Oral  Oral Oral  Resp: '20  20 20  '$ Height:      Weight:      SpO2: 100% 98% 98% 96%    Intake/Output Summary (Last 24 hours) at 02/15/16 0816 Last data filed at 02/15/16 0650  Gross per 24 hour  Intake    710 ml  Output   1600 ml  Net   -890 ml   Filed Weights   02/12/16 1113 02/12/16 1149 02/12/16 1740  Weight: 55.792 kg (123  lb) 55.792 kg (123 lb) 52 kg (114 lb 10.2 oz)    Examination:  General exam: Pleasant elderly female lying comfortably propped up in bed. Appears calm and comfortable  Respiratory system: no wheezing.  Cardiovascular system: S1 & S2 heard,mild  regular tachycardia. No JVD, murmurs, rubs, gallops or clicks. No pedal edema. telemetry: Sinus tachycardia in the 110s-120s.  Gastrointestinal system: Abdomen is nondistended, soft and nontender. No organomegaly or masses felt. Normal bowel sounds heard. Central nervous system: Alert and oriented. No focal neurological deficits. Extremities: Symmetric 5 x 5 power.     Data Reviewed: I have personally reviewed following labs and imaging studies  CBC:  Recent Labs Lab 02/12/16 1132 02/13/16 0121 02/14/16 0440 02/15/16 0750  WBC 1.5* 3.5* 32.2* 79.7*  NEUTROABS 0.6* 2.0 27.6*  --   HGB 10.8* 10.4* 8.9* 9.0*  HCT 31.9* 30.7* 26.0* 26.3*  MCV 78.0 77.3* 77.6* 78.3  PLT 363 349 398 557*   Basic Metabolic Panel:  Recent Labs Lab 02/12/16 1132 02/13/16 0121 02/14/16 0440  NA 131* 138 140  K 3.6 4.2 3.9  CL 93* 105 105  CO2 '26 27 24  '$ GLUCOSE 252* 169* 187*  BUN '14 10 13  '$ CREATININE 0.70 0.70 0.68  CALCIUM 9.0 8.8* 8.7*  MG 1.2* 2.0  --    GFR: Estimated Creatinine Clearance: 49.1 mL/min (by C-G formula based on Cr of 0.68). Liver Function Tests:  Recent Labs Lab 02/12/16 1132  AST 17  ALT 19  ALKPHOS 67  BILITOT 1.1  PROT 6.5  ALBUMIN 3.3*   No results for input(s): LIPASE, AMYLASE in the last 168 hours. No results for input(s): AMMONIA in the last 168 hours. Coagulation Profile:  Recent Labs Lab 02/12/16 1920  INR 1.23   Cardiac Enzymes:  Recent Labs Lab 02/12/16 1920 02/13/16 0121 02/13/16 0845  TROPONINI 0.03 <0.03 <0.03   BNP (last 3 results) No results for input(s): PROBNP in the last 8760 hours. HbA1C:  Recent Labs  02/13/16 0129  HGBA1C 6.6*   CBG:  Recent Labs Lab 02/13/16 1709 02/14/16 1809 02/15/16 0638  GLUCAP 224* 191* 148*   Lipid Profile: No results for input(s): CHOL, HDL, LDLCALC, TRIG, CHOLHDL, LDLDIRECT in the last 72 hours. Thyroid Function Tests: No results for input(s): TSH, T4TOTAL, FREET4, T3FREE, THYROIDAB  in the last 72 hours. Anemia Panel: No results for input(s): VITAMINB12, FOLATE, FERRITIN, TIBC, IRON, RETICCTPCT in the last 72 hours. Urine analysis:    Component Value Date/Time   COLORURINE YELLOW 02/12/2016 [place] 02/12/2016 1407   LABSPEC 1.015 02/12/2016 1407   PHURINE 7.0 02/12/2016 1407   GLUCOSEU NEGATIVE 02/12/2016 1407   HGBUR NEGATIVE 02/12/2016 1407   [place] 02/12/2016 1407   KETONESUR NEGATIVE 02/12/2016 1407   PROTEINUR NEGATIVE 02/12/2016 1407   UROBILINOGEN 0.2 08/18/2015 0730   NITRITE NEGATIVE 02/12/2016 1407   LEUKOCYTESUR NEGATIVE 02/12/2016 1407    Recent Results (from the past 240 hour(s))  Culture, blood (routine x 2)     Status: None (Preliminary result)   Collection Time: 02/12/16 11:20 AM  Result Value Ref Range Status   Specimen Description BLOOD PORTA CATH  Final   Special Requests BOTTLES DRAWN AEROBIC AND ANAEROBIC 5ML  Final   Culture   Final    NO GROWTH 2 DAYS Performed at [facility]    Report Status PENDING  Incomplete  Culture, blood (routine x 2)     Status: None (Preliminary result)   Collection  Time: 02/12/16 11:47 AM  Result Value Ref Range Status   Specimen Description BLOOD RIGHT ANTECUBITAL  Final   Special Requests BOTTLES DRAWN AEROBIC AND ANAEROBIC 5ML  Final   Culture   Final    NO GROWTH 2 DAYS Performed at [facility]    Report Status PENDING  Incomplete  Urine culture     Status: Abnormal   Collection Time: 02/12/16  2:07 PM  Result Value Ref Range Status   Specimen Description URINE, CLEAN CATCH  Final   Special Requests NONE  Final   Culture (A)  Final    9,000 COLONIES/mL INSIGNIFICANT GROWTH Performed at [facility]    Report Status 02/14/2016 FINAL  Final         Radiology Studies: No results found.      Scheduled Meds: . budesonide  0.25 mg Nebulization BID  . calcium-vitamin D  1 tablet Oral Daily  . ceFEPime (MAXIPIME) IV  2 g  Intravenous Q8H  . diltiazem  180 mg Oral Daily  . enoxaparin (LOVENOX) injection  40 mg Subcutaneous Q24H  . famotidine  20 mg Oral BID  . folic acid  062 mcg Oral Daily  . guaiFENesin  600 mg Oral BID  . ipratropium-albuterol  3 mL Nebulization QID  . magnesium oxide  400 mg Oral BID  . morphine  15 mg Oral Q12H  . simvastatin  10 mg Oral QHS  . sodium chloride flush  10-40 mL Intracatheter Q12H  . sodium chloride flush  3 mL Intravenous Q12H  . vancomycin  500 mg Intravenous Q12H   Continuous Infusions: . sodium chloride       LOS: 2 days    Time spent: 35 minutes.    Elmarie Shiley, MD [facility] Pager [phone]  If 7PM-7AM, please contact night-coverage [email or web address] Password [email or web address] 02/15/2016, 8:16 AM

## 2016-02-15 NOTE — Progress Notes (Signed)
Inpatient Diabetes Program Recommendations  AACE/ADA: New Consensus Statement on Inpatient Glycemic Control (2015)  Target Ranges:  Prepandial:   less than 140 mg/dL      Peak postprandial:   less than 180 mg/dL (1-2 hours)      Critically ill patients:  140 - 180 mg/dL   Review of Glycemic Control  Diabetes history: None Outpatient Diabetes medications: N/A Current orders for Inpatient glycemic control: None  Results for RAIGAN, BARIA (MRN [id number]) as of 02/15/2016 15:44  Ref. Range 02/13/2016 17:09 02/14/2016 18:09 02/15/2016 06:38 02/15/2016 12:28  Glucose-Capillary Latest Ref Range: 65-99 mg/dL 224 (H) 191 (H) 148 (H) 142 (H)  Results for POLLY, BARNER (MRN [id number]) as of 02/15/2016 15:44  Ref. Range 02/13/2016 01:29  Hemoglobin A1C Latest Ref Range: 4.8-5.6 % 6.6 (H)  CBGs < '200mg'$ /dL. HgbA1C 6.6%  Spoke with pt regarding her glycemic control. States she has never had a problem with blood sugars previously. Has been on steroids and has low H/H. HgbA1C likely not accurate for a diabetes diagnosis. Ordered Living Well With Diabetes book. Encouraged pt to continue eating a healthy diet. Would not recommend Novolog correction insulin unless CBGs > 200 mg/dL on a consistent basis.   Discussed with RN. Pt very appreciative of consult. Will continue to follow for needs.  Thank you. Lorenda Peck, RD, LDN, CDE Inpatient Diabetes Coordinator [phone]

## 2016-02-15 NOTE — Progress Notes (Addendum)
Pharmacy Antibiotic Note  Destiny Harrison is a 75 y.o. female admitted on 02/12/2016 with increasing SOB and "lump in throat". Hx stage 4 lung cancer. Received chemotherapy 4/18 and Neulasta 4/20. Pharmacy has been consulted for Cefepime and Vancomcyin dosing for suspected HCAP.  Patient remains afebrile and is no longer neutropenic, in fact, WBC has significantly increased now, potentially in response to Neulasta + steroids.  Vancomycin trough ordered for this afternoon was entered late and IV Team was unable to obtain trough level in a reasonable time.  Patient's labs are collected by IV Team via port.  Today is day #4 vancomycin and cefepime.  Plan:  Reschedule trough level for tomorrow when IV Team can collect.  Continue Vancomycin '500mg'$  IV every 12 hours.  Since cultures are negative and patient is afebrile, question if vancomycin can be discontinued.  Adjust Cefepime to 2g IV q12h for CrCl<60 ml/min.   Height: '5\' 4"'$  (162.6 cm) Weight: 114 lb 10.2 oz (52 kg) IBW/kg (Calculated) : 54.7  Temp (24hrs), Avg:98.3 F (36.8 C), Min:97.8 F (36.6 C), Max:98.9 F (37.2 C)   Recent Labs Lab 02/12/16 1132 02/13/16 0121 02/14/16 0440 02/15/16 0750  WBC 1.5* 3.5* 32.2* 79.7*  CREATININE 0.70 0.70 0.68  --   LATICACIDVEN 2.06*  --   --   --     Estimated Creatinine Clearance: 49.1 mL/min (by C-G formula based on Cr of 0.68).    Allergies  Allergen Reactions  . Shellfish Allergy Swelling    Antimicrobials this admission: Cefepime 4/24 >>  Vanc 4/24 >>   Dose adjustments this admission: 4/27 Reduce Cefepime 2g q8h to 2g q12h for CrCl<60 4/28 VT: ___ on 500 mg q12h  Microbiology results: 4/24 BCx x2: ngtd 4/24 UCx: 9k/ml insignificant growth  Thank you for allowing pharmacy to be a part of this patient's care.  Hershal Coria, PharmD, BCPS Pager: [phone] 02/15/2016 1:02 PM

## 2016-02-16 LAB — CBC
HEMATOCRIT: 27 % — AB (ref 36.0–46.0)
Hemoglobin: 9.2 g/dL — ABNORMAL LOW (ref 12.0–15.0)
MCH: 26.4 pg (ref 26.0–34.0)
MCHC: 34.1 g/dL (ref 30.0–36.0)
MCV: 77.6 fL — AB (ref 78.0–100.0)
Platelets: 377 10*3/uL (ref 150–400)
RBC: 3.48 MIL/uL — ABNORMAL LOW (ref 3.87–5.11)
RDW: 15.6 % — AB (ref 11.5–15.5)
WBC: 84.2 10*3/uL (ref 4.0–10.5)

## 2016-02-16 LAB — CBC WITH DIFFERENTIAL/PLATELET
BAND NEUTROPHILS: 5 %
BASOS ABS: 0 10*3/uL (ref 0.0–0.1)
BLASTS: 0 %
Basophils Relative: 0 %
EOS ABS: 0 10*3/uL (ref 0.0–0.7)
Eosinophils Relative: 0 %
HEMATOCRIT: 26.9 % — AB (ref 36.0–46.0)
HEMOGLOBIN: 9.2 g/dL — AB (ref 12.0–15.0)
LYMPHS PCT: 0 %
Lymphs Abs: 0 10*3/uL — ABNORMAL LOW (ref 0.7–4.0)
MCH: 26.6 pg (ref 26.0–34.0)
MCHC: 34.2 g/dL (ref 30.0–36.0)
MCV: 77.7 fL — AB (ref 78.0–100.0)
METAMYELOCYTES PCT: 3 %
Monocytes Absolute: 0.8 10*3/uL (ref 0.1–1.0)
Monocytes Relative: 1 %
Myelocytes: 4 %
Neutro Abs: 75.6 10*3/uL — ABNORMAL HIGH (ref 1.7–7.7)
Neutrophils Relative %: 87 %
Other: 0 %
PROMYELOCYTES ABS: 0 %
Platelets: 358 10*3/uL (ref 150–400)
RBC: 3.46 MIL/uL — AB (ref 3.87–5.11)
RDW: 15.7 % — ABNORMAL HIGH (ref 11.5–15.5)
WBC: 76.4 10*3/uL — AB (ref 4.0–10.5)
nRBC: 0 /100 WBC

## 2016-02-16 LAB — BASIC METABOLIC PANEL
Anion gap: 9 (ref 5–15)
BUN: 14 mg/dL (ref 6–20)
CALCIUM: 8.9 mg/dL (ref 8.9–10.3)
CHLORIDE: 102 mmol/L (ref 101–111)
CO2: 30 mmol/L (ref 22–32)
CREATININE: 0.63 mg/dL (ref 0.44–1.00)
GFR calc Af Amer: >60 mL/min (ref >60)
GFR calc non Af Amer: >60 mL/min (ref >60)
Glucose, Bld: 144 mg/dL — ABNORMAL HIGH (ref 65–99)
Potassium: 3.8 mmol/L (ref 3.5–5.1)
Sodium: 141 mmol/L (ref 135–145)

## 2016-02-16 LAB — SAVE SMEAR

## 2016-02-16 LAB — GLUCOSE, CAPILLARY
GLUCOSE-CAPILLARY: 112 mg/dL — AB (ref 65–99)
GLUCOSE-CAPILLARY: 124 mg/dL — AB (ref 65–99)
Glucose-Capillary: 184 mg/dL — ABNORMAL HIGH (ref 65–99)

## 2016-02-16 LAB — VANCOMYCIN, TROUGH: VANCOMYCIN TR: 10 ug/mL (ref 10.0–20.0)

## 2016-02-16 NOTE — Care Management Important Message (Signed)
Important Message  Patient Details IM Letter given to Cookie/Case Manager to present to Patient Name: Destiny Harrison MRN: [id number] Date of Birth: 1941/08/25   Medicare Important Message Given:  Yes    Camillo Flaming 02/16/2016, 10:33 AMImportant Message  Patient Details  Name: Destiny Harrison MRN: [id number] Date of Birth: 1940-12-08   Medicare Important Message Given:  Yes    Camillo Flaming 02/16/2016, 10:33 AM

## 2016-02-16 NOTE — Progress Notes (Signed)
PROGRESS NOTE  Destiny Harrison  DUK:[id number] DOB: 1941/05/30  DOA: 02/12/2016 PCP: Thressa Sheller, MD  Outpatient Specialists:  Medical Oncology: Dr. Curt Bears Pulmonology: Dr. Brand Males  Brief Narrative:  75 year old female with history of metastatic NSCLC, diagnosed in 2013, who has disease progression despite prior radiation and chemotherapy, started new palliative chemotherapy regimen last week, COPD/asthma, chronic respiratory failure on home oxygen 2 L/m, HLD, presented to the ED with 1 day history of progressive dyspnea, has chronic mid substernal chest pain >1 year, some lightheadedness, decreased appetite, nausea, dry heaves and intermittent abdominal pain which improved after a large BM in the hospital. No fevers. CT chest, abdomen and pelvis in ED ruled out PE. Admitted for neutropenic low-grade fever, early sepsis secondary to possible HCAP.   Assessment & Plan:   Principal Problem:   Neutropenic fever ([place]) Active Problems:   COPD, moderate ([place])   Primary cancer of right upper lobe of lung (HCC)   Sinus tachycardia (HCC)   HCAP (healthcare-associated pneumonia)   Neutropenic fever/early sepsis/possible HCAP - Blood cultures 2: Negative to date. Urine microscopy: Not suggestive of UTI. Neutropenia has resolved.  - Treated per sepsis protocol with IV fluids and broad-spectrum IV antibiotics. Continue antibiotics. -continue with IV antibiotics.   Leukocytosis, suspect related to nelusta and steroids.  IV fluids. Hold steroids.  Continue with IV antibiotics.  Repeat labs in am.   Sinus tachycardia - Multifactorial related to early sepsis, dehydration, pain and dyspnea - No arrhythmias on telemetry. Treat underlying cause. Monitor. - Continue diltiazem-  Acute on chronic hypoxic respiratory failure/COPD - Multifactorial related to COPD, lung cancer/mass and possible healthcare associated pneumonia - Treated with oxygen, IV Solu-Medrol, broad-spectrum IV  antibiotics and bronchodilators. - CTA chest: No obvious pneumonia reported. CT abdomen and pelvis without definitive metastatic disease. Pro calcitonin elevated at 1.02. - Continue with IV antibiotics.  -hold steroids, WBC at 79./   Constipation - Bowel regimen. Had multiple BMs since admission.  Hypomagnesemia -Replaced.   Metastatic non-small cell lung cancer/chronic chest pain related to malignancy - Progression despite prior therapies. Just started new palliative chemotherapy regimen of Cyramza/Taxotere -Follows with Dr Julien Nordmann.   Hyperlipidemia - Continue statins.  Anemia - Stable. Follow CBCs. Transfuse if hemoglobin <7 g per DL.  Hyperglycemia - Monitor CBGs.  A1c 6.6. Consider SSI if persistently elevated CBGs.   DVT prophylaxis: Lovenox  Code Status: DO NOT RESUSCITATE  Family Communication: Discussed in detail with patient. No family at bedside.  Disposition Plan: DC home when medically stable. In 24 to 48 hours.    Consultants:   None   Procedures:   None  Antimicrobials:   IV cefepime 4/24 >  IV vancomycin 4/24 >    Subjective: She is feeling better, dyspnea improved.   Objective:  Filed Vitals:   02/15/16 2144 02/16/16 0545 02/16/16 0818 02/16/16 1239  BP: 149/84 131/87    Pulse: 104 99    Temp: 98.1 F (36.7 C) 98.2 F (36.8 C)    TempSrc: Oral Oral    Resp: 19 15    Height:      Weight:      SpO2: 100% 100% 98% 97%    Intake/Output Summary (Last 24 hours) at 02/16/16 1240 Last data filed at 02/16/16 0742  Gross per 24 hour  Intake 1391.25 ml  Output   2300 ml  Net -908.75 ml   Filed Weights   02/12/16 1113 02/12/16 1149 02/12/16 1740  Weight: 55.792 kg (123 lb) 55.792 kg (123  lb) 52 kg (114 lb 10.2 oz)    Examination:  General exam: Pleasant elderly female lying comfortably propped up in bed. Appears calm and comfortable  Respiratory system: no wheezing.  Cardiovascular system: S1 & S2 heard,mild regular tachycardia. No  JVD, murmurs, rubs, gallops or clicks. No pedal edema. telemetry: Sinus tachycardia in the 110s-120s.  Gastrointestinal system: Abdomen is nondistended, soft and nontender. No organomegaly or masses felt. Normal bowel sounds heard. Central nervous system: Alert and oriented. No focal neurological deficits. Extremities: Symmetric 5 x 5 power.     Data Reviewed: I have personally reviewed following labs and imaging studies  CBC:  Recent Labs Lab 02/12/16 1132 02/13/16 0121 02/14/16 0440 02/15/16 0750 02/16/16 0520 02/16/16 0757  WBC 1.5* 3.5* 32.2* 79.7* 84.2* 76.4*  NEUTROABS 0.6* 2.0 27.6*  --   --  75.6*  HGB 10.8* 10.4* 8.9* 9.0* 9.2* 9.2*  HCT 31.9* 30.7* 26.0* 26.3* 27.0* 26.9*  MCV 78.0 77.3* 77.6* 78.3 77.6* 77.7*  PLT 363 349 398 401* 377 811   Basic Metabolic Panel:  Recent Labs Lab 02/12/16 1132 02/13/16 0121 02/14/16 0440 02/16/16 0520  NA 131* 138 140 141  K 3.6 4.2 3.9 3.8  CL 93* 105 105 102  CO2 '26 27 24 30  '$ GLUCOSE 252* 169* 187* 144*  BUN '14 10 13 14  '$ CREATININE 0.70 0.70 0.68 0.63  CALCIUM 9.0 8.8* 8.7* 8.9  MG 1.2* 2.0  --   --    GFR: Estimated Creatinine Clearance: 49.1 mL/min (by C-G formula based on Cr of 0.63). Liver Function Tests:  Recent Labs Lab 02/12/16 1132  AST 17  ALT 19  ALKPHOS 67  BILITOT 1.1  PROT 6.5  ALBUMIN 3.3*   No results for input(s): LIPASE, AMYLASE in the last 168 hours. No results for input(s): AMMONIA in the last 168 hours. Coagulation Profile:  Recent Labs Lab 02/12/16 1920  INR 1.23   Cardiac Enzymes:  Recent Labs Lab 02/12/16 1920 02/13/16 0121 02/13/16 0845  TROPONINI 0.03 <0.03 <0.03   BNP (last 3 results) No results for input(s): PROBNP in the last 8760 hours. HbA1C: No results for input(s): HGBA1C in the last 72 hours. CBG:  Recent Labs Lab 02/15/16 0638 02/15/16 1228 02/15/16 1737 02/16/16 0744 02/16/16 1153  GLUCAP 148* 142* 218* 184* 112*   Lipid Profile: No results for  input(s): CHOL, HDL, LDLCALC, TRIG, CHOLHDL, LDLDIRECT in the last 72 hours. Thyroid Function Tests: No results for input(s): TSH, T4TOTAL, FREET4, T3FREE, THYROIDAB in the last 72 hours. Anemia Panel: No results for input(s): VITAMINB12, FOLATE, FERRITIN, TIBC, IRON, RETICCTPCT in the last 72 hours. Urine analysis:    Component Value Date/Time   COLORURINE YELLOW 02/12/2016 1407   APPEARANCEUR CLEAR 02/12/2016 1407   LABSPEC 1.015 02/12/2016 1407   PHURINE 7.0 02/12/2016 1407   GLUCOSEU NEGATIVE 02/12/2016 1407   HGBUR NEGATIVE 02/12/2016 1407   BILIRUBINUR NEGATIVE 02/12/2016 1407   KETONESUR NEGATIVE 02/12/2016 1407   PROTEINUR NEGATIVE 02/12/2016 1407   UROBILINOGEN 0.2 08/18/2015 0730   NITRITE NEGATIVE 02/12/2016 1407   LEUKOCYTESUR NEGATIVE 02/12/2016 1407    Recent Results (from the past 240 hour(s))  Culture, blood (routine x 2)     Status: None (Preliminary result)   Collection Time: 02/12/16 11:20 AM  Result Value Ref Range Status   Specimen Description BLOOD PORTA CATH  Final   Special Requests BOTTLES DRAWN AEROBIC AND ANAEROBIC 5ML  Final   Culture   Final    NO GROWTH  3 DAYS Performed at [facility]    Report Status PENDING  Incomplete  Culture, blood (routine x 2)     Status: None (Preliminary result)   Collection Time: 02/12/16 11:47 AM  Result Value Ref Range Status   Specimen Description BLOOD RIGHT ANTECUBITAL  Final   Special Requests BOTTLES DRAWN AEROBIC AND ANAEROBIC 5ML  Final   Culture   Final    NO GROWTH 3 DAYS Performed at [facility]    Report Status PENDING  Incomplete  Urine culture     Status: Abnormal   Collection Time: 02/12/16  2:07 PM  Result Value Ref Range Status   Specimen Description URINE, CLEAN CATCH  Final   Special Requests NONE  Final   Culture (A)  Final    9,000 COLONIES/mL INSIGNIFICANT GROWTH Performed at [facility]    Report Status 02/14/2016 FINAL  Final         Radiology  Studies: No results found.      Scheduled Meds: . budesonide  0.25 mg Nebulization BID  . calcium-vitamin D  1 tablet Oral Daily  . ceFEPime (MAXIPIME) IV  2 g Intravenous Q12H  . diltiazem  180 mg Oral Daily  . enoxaparin (LOVENOX) injection  40 mg Subcutaneous Q24H  . famotidine  20 mg Oral BID  . folic acid  110 mcg Oral Daily  . guaiFENesin  600 mg Oral BID  . ipratropium-albuterol  3 mL Nebulization QID  . living well with diabetes book   Does not apply Once  . magnesium oxide  400 mg Oral BID  . morphine  15 mg Oral Q12H  . simvastatin  10 mg Oral QHS  . sodium chloride flush  10-40 mL Intracatheter Q12H  . sodium chloride flush  3 mL Intravenous Q12H  . vancomycin  500 mg Intravenous Q12H   Continuous Infusions: . sodium chloride 75 mL/hr at 02/16/16 1159     LOS: 3 days    Time spent: 35 minutes.    Elmarie Shiley, MD [facility] Pager [phone]  If 7PM-7AM, please contact night-coverage [email or web address] Password [facility] 02/16/2016, 12:40 PM

## 2016-02-17 ENCOUNTER — Inpatient Hospital Stay (HOSPITAL_COMMUNITY): Payer: Medicare Other

## 2016-02-17 LAB — CULTURE, BLOOD (ROUTINE X 2)
Culture: NO GROWTH
Culture: NO GROWTH

## 2016-02-17 LAB — GLUCOSE, CAPILLARY
GLUCOSE-CAPILLARY: 77 mg/dL (ref 65–99)
GLUCOSE-CAPILLARY: 107 mg/dL — AB (ref 65–99)
GLUCOSE-CAPILLARY: 240 mg/dL — AB (ref 65–99)
Glucose-Capillary: 118 mg/dL — ABNORMAL HIGH (ref 65–99)

## 2016-02-17 LAB — TROPONIN I: Troponin I: 0.03 ng/mL (ref <0.031)

## 2016-02-17 LAB — CBC
HCT: 27 % — ABNORMAL LOW (ref 36.0–46.0)
Hemoglobin: 9 g/dL — ABNORMAL LOW (ref 12.0–15.0)
MCH: 26.2 pg (ref 26.0–34.0)
MCHC: 33.3 g/dL (ref 30.0–36.0)
MCV: 78.5 fL (ref 78.0–100.0)
Platelets: 293 10*3/uL (ref 150–400)
RBC: 3.44 MIL/uL — AB (ref 3.87–5.11)
RDW: 15.7 % — ABNORMAL HIGH (ref 11.5–15.5)
WBC: 45.3 10*3/uL — AB (ref 4.0–10.5)

## 2016-02-17 MED ORDER — NITROGLYCERIN 0.4 MG SL SUBL: 0.4000 mg | SUBLINGUAL_TABLET | SUBLINGUAL | Status: DC | PRN

## 2016-02-17 MED ORDER — METHYLPREDNISOLONE SODIUM SUCC 40 MG IJ SOLR
40.0000 mg | Freq: Every day | INTRAMUSCULAR | Status: DC
  Administered 2016-02-17 – 2016-02-18 (×2): 40 mg via INTRAVENOUS
  Filled 2016-02-17 (×2): qty 1

## 2016-02-17 NOTE — Progress Notes (Signed)
PROGRESS NOTE  Destiny Harrison  TIW:[id number] DOB: Sep 20, 1941  DOA: 02/12/2016 PCP: Thressa Sheller, MD  Outpatient Specialists:  Medical Oncology: Dr. Curt Bears Pulmonology: Dr. Brand Males  Brief Narrative:  75 year old female with history of metastatic NSCLC, diagnosed in 2013, who has disease progression despite prior radiation and chemotherapy, started new palliative chemotherapy regimen last week, COPD/asthma, chronic respiratory failure on home oxygen 2 L/m, HLD, presented to the ED with 1 day history of progressive dyspnea, has chronic mid substernal chest pain >1 year, some lightheadedness, decreased appetite, nausea, dry heaves and intermittent abdominal pain which improved after a large BM in the hospital. No fevers. CT chest, abdomen and pelvis in ED ruled out PE. Admitted for neutropenic low-grade fever, early sepsis secondary to possible HCAP.  Patient was improving clinically, until 4-29 when she develops recurrence of chest pain and dyspnea. Leukocytosis presume to nelusta and steroids. Now trending down.   Assessment & Plan:   Principal Problem:   Neutropenic fever ([place]) Active Problems:   COPD, moderate ([place])   Primary cancer of right upper lobe of lung ([facility])   Sinus tachycardia ([facility])   HCAP (healthcare-associated pneumonia)   Neutropenic fever/early sepsis/possible HCAP - Blood cultures 2: Negative to date. Urine microscopy: Not suggestive of UTI. Neutropenia has resolved.  - Treated per sepsis protocol with IV fluids and broad-spectrum IV antibiotics. Continue antibiotics. -continue with IV antibiotics.   Leukocytosis, suspect related to nelusta and steroids.  IV fluids. Hold steroids.  Continue with IV antibiotics.  WBC decreased today.   Sinus tachycardia - Multifactorial related to early sepsis, dehydration, pain and dyspnea - No arrhythmias on telemetry. Treat underlying cause. Monitor. - Continue diltiazem-  Acute on chronic hypoxic respiratory  failure/COPD - Multifactorial related to COPD, lung cancer/mass and possible healthcare associated pneumonia - Treated with oxygen, IV Solu-Medrol, broad-spectrum IV antibiotics and bronchodilators. - CTA chest: No obvious pneumonia reported. CT abdomen and pelvis without definitive metastatic disease. Pro calcitonin elevated at 1.02. - Continue with IV antibiotics.  -having an episode of dyspnea, chest pain today, similar to chest pain that she had on admission. Will repeat chest x ray, extra nebulizer, will resume solumedrol.   Constipation - Bowel regimen. Had multiple BMs since admission.  Hypomagnesemia -Replaced.   Metastatic non-small cell lung cancer/chronic chest pain related to malignancy - Progression despite prior therapies. Just started new palliative chemotherapy regimen of Cyramza/Taxotere -Follows with Dr Julien Nordmann.   Hyperlipidemia - Continue statins.  Anemia - Stable. Follow CBCs. Transfuse if hemoglobin <7 g per DL.  Hyperglycemia - Monitor CBGs.  A1c 6.6. Consider SSI if persistently elevated CBGs.   DVT prophylaxis: Lovenox  Code Status: DO NOT RESUSCITATE  Family Communication: Discussed in detail with patient. No family at bedside.  Disposition Plan: DC home when medically stable. In 24 to 48 hours.     Consultants:   None   Procedures:   None  Antimicrobials:   IV cefepime 4/24 >  IV vancomycin 4/24 >    Subjective: She is having an episode of chest pain, dyspnea, similar to what brought her here. Ct angio on admission negative for PE.    Objective:  Filed Vitals:   02/16/16 2120 02/17/16 0512 02/17/16 0821 02/17/16 1008  BP: 142/61 127/63  130/85  Pulse: 86 94  117  Temp: 98.3 F (36.8 C) 98 F (36.7 C)    TempSrc: Oral Oral    Resp: 18 18    Height:      Weight:  SpO2: 100% 99% 99%     Intake/Output Summary (Last 24 hours) at 02/17/16 1022 Last data filed at 02/17/16 0839  Gross per 24 hour  Intake 2712.5 ml  Output    2851 ml  Net -138.5 ml   Filed Weights   02/12/16 1113 02/12/16 1149 02/12/16 1740  Weight: 55.792 kg (123 lb) 55.792 kg (123 lb) 52 kg (114 lb 10.2 oz)    Examination:  General exam: Pleasant elderly female in mild distress Respiratory system: no wheezing.  Cardiovascular system: S1 & S2 heard,mild regular tachycardia. No JVD, murmurs, rubs, gallops or clicks. No pedal edema. telemetry: Sinus tachycardia in the 110s-120s.  Gastrointestinal system: Abdomen is nondistended, soft and nontender. No organomegaly or masses felt. Normal bowel sounds heard. Central nervous system: Alert and oriented. No focal neurological deficits. Extremities: Symmetric 5 x 5 power.     Data Reviewed: I have personally reviewed following labs and imaging studies  CBC:  Recent Labs Lab 02/12/16 1132 02/13/16 0121 02/14/16 0440 02/15/16 0750 02/16/16 0520 02/16/16 0757 02/17/16 0430  WBC 1.5* 3.5* 32.2* 79.7* 84.2* 76.4* 45.3*  NEUTROABS 0.6* 2.0 27.6*  --   --  75.6*  --   HGB 10.8* 10.4* 8.9* 9.0* 9.2* 9.2* 9.0*  HCT 31.9* 30.7* 26.0* 26.3* 27.0* 26.9* 27.0*  MCV 78.0 77.3* 77.6* 78.3 77.6* 77.7* 78.5  PLT 363 349 398 401* 377 358 177   Basic Metabolic Panel:  Recent Labs Lab 02/12/16 1132 02/13/16 0121 02/14/16 0440 02/16/16 0520  NA 131* 138 140 141  K 3.6 4.2 3.9 3.8  CL 93* 105 105 102  CO2 '26 27 24 30  '$ GLUCOSE 252* 169* 187* 144*  BUN '14 10 13 14  '$ CREATININE 0.70 0.70 0.68 0.63  CALCIUM 9.0 8.8* 8.7* 8.9  MG 1.2* 2.0  --   --    GFR: Estimated Creatinine Clearance: 49.1 mL/min (by C-G formula based on Cr of 0.63). Liver Function Tests:  Recent Labs Lab 02/12/16 1132  AST 17  ALT 19  ALKPHOS 67  BILITOT 1.1  PROT 6.5  ALBUMIN 3.3*   No results for input(s): LIPASE, AMYLASE in the last 168 hours. No results for input(s): AMMONIA in the last 168 hours. Coagulation Profile:  Recent Labs Lab 02/12/16 1920  INR 1.23   Cardiac Enzymes:  Recent Labs Lab  02/12/16 1920 02/13/16 0121 02/13/16 0845  TROPONINI 0.03 <0.03 <0.03   BNP (last 3 results) No results for input(s): PROBNP in the last 8760 hours. HbA1C: No results for input(s): HGBA1C in the last 72 hours. CBG:  Recent Labs Lab 02/16/16 0744 02/16/16 1153 02/16/16 1650 02/17/16 0720 02/17/16 1004  GLUCAP 184* 112* 124* 77 118*   Lipid Profile: No results for input(s): CHOL, HDL, LDLCALC, TRIG, CHOLHDL, LDLDIRECT in the last 72 hours. Thyroid Function Tests: No results for input(s): TSH, T4TOTAL, FREET4, T3FREE, THYROIDAB in the last 72 hours. Anemia Panel: No results for input(s): VITAMINB12, FOLATE, FERRITIN, TIBC, IRON, RETICCTPCT in the last 72 hours. Urine analysis:    Component Value Date/Time   COLORURINE YELLOW 02/12/2016 1407   APPEARANCEUR CLEAR 02/12/2016 1407   LABSPEC 1.015 02/12/2016 1407   PHURINE 7.0 02/12/2016 1407   GLUCOSEU NEGATIVE 02/12/2016 1407   HGBUR NEGATIVE 02/12/2016 1407   [place] 02/12/2016 1407   KETONESUR NEGATIVE 02/12/2016 1407   PROTEINUR NEGATIVE 02/12/2016 1407   UROBILINOGEN 0.2 08/18/2015 0730   NITRITE NEGATIVE 02/12/2016 1407   LEUKOCYTESUR NEGATIVE 02/12/2016 1407    Recent Results (  from the past 240 hour(s))  Culture, blood (routine x 2)     Status: None (Preliminary result)   Collection Time: 02/12/16 11:20 AM  Result Value Ref Range Status   Specimen Description BLOOD PORTA CATH  Final   Special Requests BOTTLES DRAWN AEROBIC AND ANAEROBIC 5ML  Final   Culture   Final    NO GROWTH 4 DAYS Performed at [facility]    Report Status PENDING  Incomplete  Culture, blood (routine x 2)     Status: None (Preliminary result)   Collection Time: 02/12/16 11:47 AM  Result Value Ref Range Status   Specimen Description BLOOD RIGHT ANTECUBITAL  Final   Special Requests BOTTLES DRAWN AEROBIC AND ANAEROBIC 5ML  Final   Culture   Final    NO GROWTH 4 DAYS Performed at [facility]    Report Status  PENDING  Incomplete  Urine culture     Status: Abnormal   Collection Time: 02/12/16  2:07 PM  Result Value Ref Range Status   Specimen Description URINE, CLEAN CATCH  Final   Special Requests NONE  Final   Culture (A)  Final    9,000 COLONIES/mL INSIGNIFICANT GROWTH Performed at [facility]    Report Status 02/14/2016 FINAL  Final         Radiology Studies: No results found.      Scheduled Meds: . budesonide  0.25 mg Nebulization BID  . calcium-vitamin D  1 tablet Oral Daily  . ceFEPime (MAXIPIME) IV  2 g Intravenous Q12H  . diltiazem  180 mg Oral Daily  . enoxaparin (LOVENOX) injection  40 mg Subcutaneous Q24H  . famotidine  20 mg Oral BID  . folic acid  003 mcg Oral Daily  . guaiFENesin  600 mg Oral BID  . ipratropium-albuterol  3 mL Nebulization QID  . living well with diabetes book   Does not apply Once  . magnesium oxide  400 mg Oral BID  . methylPREDNISolone (SOLU-MEDROL) injection  40 mg Intravenous Daily  . morphine  15 mg Oral Q12H  . simvastatin  10 mg Oral QHS  . sodium chloride flush  10-40 mL Intracatheter Q12H  . sodium chloride flush  3 mL Intravenous Q12H   Continuous Infusions: . sodium chloride 75 mL/hr at 02/17/16 0301     LOS: 4 days    Time spent: 35 minutes.    Elmarie Shiley, MD [facility] Pager [phone]  If 7PM-7AM, please contact night-coverage [email or web address] Password [facility] 02/17/2016, 10:22 AM

## 2016-02-18 DIAGNOSIS — D709 Neutropenia, unspecified: Secondary | ICD-10-CM

## 2016-02-18 DIAGNOSIS — C3490 Malignant neoplasm of unspecified part of unspecified bronchus or lung: Secondary | ICD-10-CM | POA: Insufficient documentation

## 2016-02-18 DIAGNOSIS — J189 Pneumonia, unspecified organism: Secondary | ICD-10-CM

## 2016-02-18 DIAGNOSIS — R5081 Fever presenting with conditions classified elsewhere: Secondary | ICD-10-CM

## 2016-02-18 DIAGNOSIS — C3411 Malignant neoplasm of upper lobe, right bronchus or lung: Secondary | ICD-10-CM

## 2016-02-18 DIAGNOSIS — J449 Chronic obstructive pulmonary disease, unspecified: Secondary | ICD-10-CM

## 2016-02-18 DIAGNOSIS — R000 Tachycardia, unspecified: Secondary | ICD-10-CM

## 2016-02-18 DIAGNOSIS — R0600 Dyspnea, unspecified: Secondary | ICD-10-CM

## 2016-02-18 LAB — CBC WITH DIFFERENTIAL/PLATELET
BASOS PCT: 1 %
Basophils Absolute: 0.4 10*3/uL — ABNORMAL HIGH (ref 0.0–0.1)
EOS ABS: 0 10*3/uL (ref 0.0–0.7)
EOS PCT: 0 %
HEMATOCRIT: 27.2 % — AB (ref 36.0–46.0)
HEMOGLOBIN: 9.3 g/dL — AB (ref 12.0–15.0)
LYMPHS PCT: 3 %
Lymphs Abs: 1.3 10*3/uL (ref 0.7–4.0)
MCH: 26.9 pg (ref 26.0–34.0)
MCHC: 34.2 g/dL (ref 30.0–36.0)
MCV: 78.6 fL (ref 78.0–100.0)
MONOS PCT: 4 %
Monocytes Absolute: 1.7 10*3/uL — ABNORMAL HIGH (ref 0.1–1.0)
NEUTROS PCT: 92 %
Neutro Abs: 39.8 10*3/uL — ABNORMAL HIGH (ref 1.7–7.7)
Platelets: 257 10*3/uL (ref 150–400)
RBC: 3.46 MIL/uL — ABNORMAL LOW (ref 3.87–5.11)
RDW: 15.8 % — ABNORMAL HIGH (ref 11.5–15.5)
WBC MORPHOLOGY: INCREASED
WBC: 43.2 10*3/uL — ABNORMAL HIGH (ref 4.0–10.5)

## 2016-02-18 LAB — GLUCOSE, CAPILLARY: Glucose-Capillary: 101 mg/dL — ABNORMAL HIGH (ref 65–99)

## 2016-02-18 MED ORDER — HEPARIN SOD (PORK) LOCK FLUSH 100 UNIT/ML IV SOLN
500.0000 [IU] | INTRAVENOUS | Status: AC | PRN
  Administered 2016-02-18: 500 [IU]

## 2016-02-18 MED ORDER — PREDNISONE 10 MG (21) PO TBPK: 10.0000 mg | ORAL_TABLET | Freq: Every day | ORAL | Status: DC

## 2016-02-18 MED ORDER — MAGNESIUM OXIDE 400 (241.3 MG) MG PO TABS: 400.0000 mg | ORAL_TABLET | Freq: Two times a day (BID) | ORAL | Status: AC

## 2016-02-18 NOTE — Care Management Note (Signed)
Case Management Note  Patient Details  Name: Destiny Harrison MRN: [id number] Date of Birth: 1941-01-29  Subjective/Objective:      Neutropenic fever/early sepsis/questionable HCAP              Action/Plan: Discharge Planning:  NCM spoke to pt and dtr-in-law at bedside. Pt states she had [facility] in the past for [facility]. Contacted [facility] with new referral. Pt has oxygen and nebulizer machine at home.   Geronimo Boot MD  Expected Discharge Date:  02/18/2016            Expected Discharge Plan:  [place]  In-House Referral:  NA  Discharge planning Services  CM Consult  Post Acute Care Choice:  Home Health Choice offered to:  Patient  DME Arranged:  N/A DME Agency:  NA  HH Arranged:  RN [place] Agency:  [place]  Status of Service:  Completed, signed off  Medicare Important Message Given:  Yes Date Medicare IM Given:    Medicare IM give by:    Date Additional Medicare IM Given:    Additional Medicare Important Message give by:     If discussed at [place] of Stay Meetings, dates discussed:    Additional Comments:  Erenest Rasher, RN 02/18/2016, 12:42 PM

## 2016-02-18 NOTE — Discharge Summary (Signed)
Physician Discharge Summary  Destiny Harrison ZOX:[id number] DOB: [birth year] DOA: 02/12/2016  PCP: Thressa Sheller, MD  Admit date: 02/12/2016 Discharge date: 02/18/2016  Time spent: 45 minutes  Recommendations for Outpatient Follow-up:  Patient will be discharged to home.  Patient will need to follow up with primary care provider within one week of discharge.  Patient should continue medications as prescribed.  Patient should follow a regular diet.   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 Lily Kocher Destiny Harrison is a 75 y.o. woman with a history of metastatic NSCLC, diagnosed in 2013, who has had progression of disease despite prior radiation and chemotherapy. She just started a new palliative chemotherapy regimen last week. She says that she felt fine going into the weekend, but over the past 24 hours, she has had progressive shortness of breath. She reports substernal chest pain that radiates to her back. She has had some light-headedness but no LOC. Her appetite has been down. She has had nausea and dry heaves. She has had intermittent abdominal pain that improved after large bowel movements. No documented fever prior to arrival to the ED today. CT of her chest, abdomen, and pelvis were obtained; PE has been ruled out. She has persistent sinus tachycardia after aggressive volume resuscitation (sepsis protocol). Empiric antibiotics have been initiated (the patient reports  being hospitalized in [facility] in late March for pneumonia). Hospitalist asked to admit for further evaluation and treatment.  Hospital Course:  Neutropenic fever/early sepsis/possible HCAP -Blood cultures 2: Negative to date. Urine microscopy: Not suggestive of UTI. Neutropenia has resolved.  -Treated per sepsis protocol with IV fluids and broad-spectrum IV antibiotics -continue with IV antibiotics.   Leukocytosis -suspect related to nelusta and steroids.  -Placed on IVF and antibiotics  -WBC decreased today.   Sinus tachycardia -Multifactorial related to early sepsis, dehydration, pain and dyspnea -No arrhythmias on telemetry. Treating underlying cause.  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 -CTA chest: No obvious pneumonia reported. CT abdomen and pelvis without definitive metastatic disease. Pro calcitonin elevated at 1.02. -No longer having chest pain or dyspnea.   -CXR 4/29: No evidence of acute cardiopulmonary disease, unchanged compared to prior study. -Was placed on IV antibiotics, vanc and cefepime -Will discharge with prednisone taper  -Patient completed 5 days of antibiotics during hospital course  Constipation -Bowel regimen. Had multiple BMs since admission.  Hypomagnesemia -Replaced.   Metastatic non-small cell lung cancer/chronic chest pain related to malignancy -Progression despite prior therapies. Just started new palliative chemotherapy regimen of Cyramza/Taxotere -Follows with Dr Julien Nordmann.   Hyperlipidemia -Continue statins.  Chronic Anemia -Stable. Baseline Hemoglobin 9-10 -Currently 9  Hyperglycemia -A1c 6.6.  -Secondary to steroids  Procedures:  None  Consultations:  none  Discharge Exam: Filed Vitals:   02/18/16  0442 02/18/16 1100  BP: 125/62   Pulse: 124 105  Temp: 98.3 F (36.8 C)   Resp: 22      General: Well developed,  well nourished, NAD, appears stated age  75: NCAT, mucous membranes moist.  Neck: Supple, no JVD, no masses  Cardiovascular: S1 S2 auscultated, tachycardic.   Respiratory: Clear to auscultation bilaterally. No wheezing  Abdomen: Soft, nontender, nondistended, + bowel sounds  Extremities: warm dry without cyanosis clubbing or edema  Neuro: AAOx3, nonfocal  Psych: Normal affect and demeanor  Discharge Instructions      Discharge Instructions    Discharge instructions    Complete by:  As directed   Patient will be discharged to home.  Patient will need to follow up with primary care provider within one week of discharge.  Patient should continue medications as prescribed.  Patient should follow a regular diet.            Medication List    STOP taking these medications        dexamethasone 4 MG tablet  Commonly known as:  DECADRON     predniSONE 5 MG tablet  Commonly known as:  DELTASONE  Replaced by:  predniSONE 10 MG (21) Tbpk tablet      TAKE these medications        albuterol (2.5 MG/3ML) 0.083% nebulizer solution  Commonly known as:  PROVENTIL  Take 2.5 mg by nebulization every 8 (eight) hours.     albuterol 108 (90 Base) MCG/ACT inhaler  Commonly known as:  PROAIR HFA  Inhale 2 puffs into the lungs every 6 (six) hours as needed for wheezing or shortness of breath.     benzonatate 100 MG capsule  Commonly known as:  TESSALON  Take 100 mg by mouth every 8 (eight) hours as needed for cough.     budesonide 0.25 MG/2ML nebulizer solution  Commonly known as:  PULMICORT  Take 2 mLs (0.25 mg total) by nebulization 2 (two) times daily.     calcium-vitamin D 500-200 MG-UNIT tablet  Commonly known as:  OSCAL WITH D  Take 1 tablet by mouth daily.     CARDIZEM CD 180 MG 24 hr capsule  Generic drug:  diltiazem  Take 180 mg by mouth daily.       folic acid 220 MCG tablet  Commonly known as:  FOLVITE  Take 400 mcg by mouth daily.     guaiFENesin 600 MG 12 hr tablet  Commonly known as:  MUCINEX  Take 1 tablet (600 mg total) by mouth 2 (two) times daily.     HYDROcodone-acetaminophen 5-325 MG tablet  Commonly known as:  NORCO/VICODIN  Take 1 tablet by mouth every 4 (four) hours as needed for moderate pain.     ipratropium 0.02 % nebulizer solution  Commonly known as:  ATROVENT  Take 0.5 mg by nebulization every 8 (eight) hours.     magnesium oxide 400 (241.3 Mg) MG tablet  Commonly known as:  MAG-OX  Take 1 tablet (400 mg total) by mouth 2 (two) times daily.     morphine 15 MG 12 hr tablet  Commonly known as:  MS CONTIN  Take 1 tablet (15 mg total) by mouth every 12 (twelve) hours.     ondansetron 8 MG disintegrating tablet  Commonly known as:  ZOFRAN ODT  Take 1 tablet (8 mg total) by mouth every 8 (eight) hours as needed for nausea or vomiting.     ondansetron 8 MG tablet  Commonly known as:  ZOFRAN  Take 8 mg by mouth every 8 (eight) hours as needed. Reported on 02/01/2016     OXYGEN  Inhale 2 L into  the lungs continuous.     pantoprazole 40 MG tablet  Commonly known as:  PROTONIX  Take 40 mg by mouth daily as needed (acid reflux).     predniSONE 10 MG (21) Tbpk tablet  Commonly known as:  STERAPRED UNI-PAK 21 TAB  Take 1 tablet (10 mg total) by mouth daily. Use as directed     prochlorperazine 10 MG tablet  Commonly known as:  COMPAZINE  Take 1 tablet (10 mg total) by mouth every 6 (six) hours as needed for nausea or vomiting.     simvastatin 5 MG tablet  Commonly known as:  ZOCOR  Take 10 mg by mouth at bedtime.       Allergies  Allergen Reactions  . Shellfish Allergy Swelling   Follow-up Information    Follow up with Thressa Sheller, MD In 1 week.   Specialty:  Internal Medicine   Contact information:   [address], [address] [place] [address] [phone]        The  results of significant diagnostics from this hospitalization (including imaging, microbiology, ancillary and laboratory) are listed below for reference.    Significant Diagnostic Studies: Dg Chest 2 View  02/12/2016  CLINICAL DATA:  Increasing shortness of breath beginning 02/10/2016. Stage IV lung cancer. EXAM: CHEST  2 VIEW COMPARISON:  Chest x-ray 01/10/2016.  Chest CT 01/30/2016. FINDINGS: Large right paratracheal mass again noted as seen on CT. Right perihilar opacity also appears stable. Probable postradiation changes in the medial left upper lobe, stable. Heart is normal size. No effusions. No acute bony abnormality. IMPRESSION: Stable right paratracheal mass and right perihilar opacity. Stable postradiation changes in the left upper lobe. Electronically Signed   By: Rolm Baptise M.D.   On: 02/12/2016 12:17   Ct Chest W Contrast  01/30/2016  CLINICAL DATA:  Lung cancer followup EXAM: CT CHEST, ABDOMEN, AND PELVIS WITH CONTRAST TECHNIQUE: Multidetector CT imaging of the chest, abdomen and pelvis was performed following the standard protocol during bolus administration of intravenous contrast. CONTRAST:  169m ISOVUE-300 IOPAMIDOL (ISOVUE-300) INJECTION 61% COMPARISON:  09/12/2015 FINDINGS: CT CHEST FINDINGS Mediastinum/Lymph Nodes: The heart size is normal. There is no pericardial effusion. Aortic atherosclerosis noted. Calcification in the LAD coronary artery noted. The trachea appears patent. Normal appearance of the esophagus. There is a large right paratracheal lymph node mass which measures 5.8 x 4.4 cm, image 16 of series 2. On the previous exam this measured 4.2 by 3.0 cm. Lower right paratracheal lymph node measures 1 cm, image 21 of series 2. Previously 0.9 cm. Sub- carinal lymph node measures 1 cm, image 26 of series 2. Previously 0.9 cm. Lungs/Pleura: No pleural fluid. Paramediastinal radiation change within the left lung is identified and appears stable. The right upper lobe perihilar mass  is again noted. This measures 2.2 x 3.8 cm, image 51 of series 5. Not significantly changed in size from previous exam. Moderate changes of centrilobular and paraseptal emphysema identified. New cluster of nodules within the periphery of the right lower lobe identified, image 59 of series 5. Musculoskeletal: The bones appear osteopenic. No aggressive lytic or sclerotic bone lesions identified. CT ABDOMEN PELVIS FINDINGS Hepatobiliary: The hyper attenuating structure within the inferior right lobe of liver anteriorly is identified and appears unchanged, image 60 of series 2. This is favored to represent a benign vascular abnormality. Gallbladder is normal. No biliary dilatation. Pancreas: Normal appearance of the pancreas. Spleen: The spleen appears normal. Adrenals/Urinary Tract: Normal adrenal glands. No worrisome kidney abnormality noted.  There is scarring involving the inferior pole of the left kidney. Stomach/Bowel: The stomach is within normal limits. The small bowel loops have a normal course and caliber. No obstruction. Normal appearance of the colon. Vascular/Lymphatic: Calcified atherosclerotic disease involves the abdominal aorta. No aneurysm. No enlarged retroperitoneal or mesenteric adenopathy. No enlarged pelvic or inguinal lymph nodes. Reproductive: No mass or other significant abnormality. Other: There is no ascites or focal fluid collections within the abdomen or pelvis. 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 IMPRESSION: 1. Interval progression of disease. The large right paratracheal lymph node has increased in size when compared with previous exam. 2. Stable right perihilar lung mass. 3. New clustered nodules in the periphery of the right lower lobe are favored to represent sequelae of inflammation/infection. 4. Aortic atherosclerosis and coronary artery calcification. Electronically Signed   By: Kerby Moors M.D.   On: 01/30/2016 09:29   Ct Angio Chest  Pe W/cm &/or Wo Cm  02/12/2016  CLINICAL DATA:  Shortness of breath, current history of lung cancer. EXAM: CT ANGIOGRAPHY CHEST CT ABDOMEN AND PELVIS WITH CONTRAST TECHNIQUE: Multidetector CT imaging of the chest was performed using the standard protocol during bolus administration of intravenous contrast. Multiplanar CT image reconstructions and MIPs were obtained to evaluate the vascular anatomy. Multidetector CT imaging of the abdomen and pelvis was performed using the standard protocol during bolus administration of intravenous contrast. CONTRAST:  100 mL of Isovue 370 intravenously. COMPARISON:  CT scan of January 30, 2016. FINDINGS: CTA CHEST FINDINGS There is no definite evidence of pulmonary embolus. There is no evidence of thoracic aortic dissection or aneurysm. Atherosclerosis of thoracic aorta is noted. No pneumothorax is noted. Stable atelectasis or scarring is seen involving the right middle lobe. No significant pleural effusion is noted. Stable left perihilar post radiation changes are noted. Mild biapical scarring is noted. 6 mm nodule is noted posteriorly in the right lower which was present on prior exam is concerning for metastatic disease. This is best seen on image number 45 of series 12. 6.4 x 4.5 cm right peritracheal mass is noted which may be slightly enlarged compared to prior exam. Right internal jugular Port-A-Cath is noted with tip in expected position of the SVC. Stable 9 mm precarinal lymph node is noted. 12 mm sub carinal lymph node is noted which is slightly enlarged compared to prior exam. CT ABDOMEN and PELVIS FINDINGS Moderate degenerative disc disease is noted at L3-4 and L4-5. 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 No gallstones are noted. Staple enhancing abnormality is noted anteriorly in the right hepatic lobe most consistent with benign vascular abnormality. No other definite hepatic lesions are noted. The spleen and pancreas appear normal.  Adrenal glands and kidneys appear normal. No hydronephrosis or renal obstruction is noted. Atherosclerosis of abdominal aorta is noted without aneurysm formation. Uterus and ovaries are unremarkable. There is no evidence of bowel obstruction. No abnormal fluid collection is noted. Moderate urinary bladder distention is noted. There is no definite evidence of adenopathy seen in the abdomen or pelvis. Review of the MIP images confirms the above findings. IMPRESSION: No definite evidence of pulmonary embolus. Atherosclerosis of thoracic aorta is noted without aneurysm or dissection. Stable atelectasis or scarring seen in the right middle lobe. Stable left perihilar radiation changes. Mildly enlarged right peritracheal mass is noted consistent with malignancy or metastatic disease. Stable mediastinal adenopathy and right lower lobe pulmonary nodule is noted. Atherosclerosis of abdominal aorta without aneurysm formation.  Moderate distention of urinary bladder is noted. No definite metastatic disease seen in the abdomen or pelvis. Electronically Signed   By: Marijo Conception, M.D.   On: 02/12/2016 14:25   Ct Abdomen Pelvis W Contrast  02/12/2016  CLINICAL DATA:  Shortness of breath, current history of lung cancer. EXAM: CT ANGIOGRAPHY CHEST CT ABDOMEN AND PELVIS WITH CONTRAST TECHNIQUE: Multidetector CT imaging of the chest was performed using the standard protocol during bolus administration of intravenous contrast. Multiplanar CT image reconstructions and MIPs were obtained to evaluate the vascular anatomy. Multidetector CT imaging of the abdomen and pelvis was performed using the standard protocol during bolus administration of intravenous contrast. CONTRAST:  100 mL of Isovue 370 intravenously. COMPARISON:  CT scan of January 30, 2016. FINDINGS: CTA CHEST FINDINGS There is no definite evidence of pulmonary embolus. There is no evidence of thoracic aortic dissection or aneurysm. Atherosclerosis of thoracic aorta is  noted. No pneumothorax is noted. Stable atelectasis or scarring is seen involving the right middle lobe. No significant pleural effusion is noted. Stable left perihilar post radiation changes are noted. Mild biapical scarring is noted. 6 mm nodule is noted posteriorly in the right lower which was present on prior exam is concerning for metastatic disease. This is best seen on image number 45 of series 12. 6.4 x 4.5 cm right peritracheal mass is noted which may be slightly enlarged compared to prior exam. Right internal jugular Port-A-Cath is noted with tip in expected position of the SVC. Stable 9 mm precarinal lymph node is noted. 12 mm sub carinal lymph node is noted which is slightly enlarged compared to prior exam. CT ABDOMEN and PELVIS FINDINGS Moderate degenerative disc disease is noted at L3-4 and L4-5. 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 No gallstones are noted. Staple enhancing abnormality is noted anteriorly in the right hepatic lobe most consistent with benign vascular abnormality. No other definite hepatic lesions are noted. The spleen and pancreas appear normal. Adrenal glands and kidneys appear normal. No hydronephrosis or renal obstruction is noted. Atherosclerosis of abdominal aorta is noted without aneurysm formation. Uterus and ovaries are unremarkable. There is no evidence of bowel obstruction. No abnormal fluid collection is noted. Moderate urinary bladder distention is noted. There is no definite evidence of adenopathy seen in the abdomen or pelvis. Review of the MIP images confirms the above findings. IMPRESSION: No definite evidence of pulmonary embolus. Atherosclerosis of thoracic aorta is noted without aneurysm or dissection. Stable atelectasis or scarring seen in the right middle lobe. Stable left perihilar radiation changes. Mildly enlarged right peritracheal mass is noted consistent with malignancy or metastatic disease. Stable mediastinal adenopathy and  right lower lobe pulmonary nodule is noted. Atherosclerosis of abdominal aorta without aneurysm formation. Moderate distention of urinary bladder is noted. No definite metastatic disease seen in the abdomen or pelvis. Electronically Signed   By: Marijo Conception, M.D.   On: 02/12/2016 14:25   Ct Abdomen Pelvis W Contrast  01/30/2016  CLINICAL DATA:  Lung cancer followup EXAM: CT CHEST, ABDOMEN, AND PELVIS WITH CONTRAST TECHNIQUE: Multidetector CT imaging of the chest, abdomen and pelvis was performed following the standard protocol during bolus administration of intravenous contrast. CONTRAST:  141m ISOVUE-300 IOPAMIDOL (ISOVUE-300) INJECTION 61% COMPARISON:  09/12/2015 FINDINGS: CT CHEST FINDINGS Mediastinum/Lymph Nodes: The heart size is normal. There is no pericardial effusion. Aortic atherosclerosis noted. Calcification in the LAD coronary artery noted. The trachea appears patent. Normal appearance of the esophagus. There is a  large right paratracheal lymph node mass which measures 5.8 x 4.4 cm, image 16 of series 2. On the previous exam this measured 4.2 by 3.0 cm. Lower right paratracheal lymph node measures 1 cm, image 21 of series 2. Previously 0.9 cm. Sub- carinal lymph node measures 1 cm, image 26 of series 2. Previously 0.9 cm. Lungs/Pleura: No pleural fluid. Paramediastinal radiation change within the left lung is identified and appears stable. The right upper lobe perihilar mass is again noted. This measures 2.2 x 3.8 cm, image 51 of series 5. Not significantly changed in size from previous exam. Moderate changes of centrilobular and paraseptal emphysema identified. New cluster of nodules within the periphery of the right lower lobe identified, image 59 of series 5. Musculoskeletal: The bones appear osteopenic. No aggressive lytic or sclerotic bone lesions identified. CT ABDOMEN PELVIS FINDINGS Hepatobiliary: The hyper attenuating structure within the inferior right lobe of liver anteriorly is  identified and appears unchanged, image 60 of series 2. This is favored to represent a benign vascular abnormality. Gallbladder is normal. No biliary dilatation. Pancreas: Normal appearance of the pancreas. Spleen: The spleen appears normal. Adrenals/Urinary Tract: Normal adrenal glands. No worrisome kidney abnormality noted. There is scarring involving the inferior pole of the left kidney. Stomach/Bowel: The stomach is within normal limits. The small bowel loops have a normal course and caliber. No obstruction. Normal appearance of the colon. Vascular/Lymphatic: Calcified atherosclerotic disease involves the abdominal aorta. No aneurysm. No enlarged retroperitoneal or mesenteric adenopathy. No enlarged pelvic or inguinal lymph nodes. Reproductive: No mass or other significant abnormality. Other: There is no ascites or focal fluid collections within the abdomen or pelvis. 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 IMPRESSION: 1. Interval progression of disease. The large right paratracheal lymph node has increased in size when compared with previous exam. 2. Stable right perihilar lung mass. 3. New clustered nodules in the periphery of the right lower lobe are favored to represent sequelae of inflammation/infection. 4. Aortic atherosclerosis and coronary artery calcification. Electronically Signed   By: Kerby Moors M.D.   On: 01/30/2016 09:29   Dg Chest Port 1 View  02/17/2016  CLINICAL DATA:  75 year old female with dyspnea this morning. Stage IV lung cancer diagnosed 2012. EXAM: PORTABLE CHEST 1 VIEW COMPARISON:  Multiple priors, most recently chest x-ray 02/12/2016. Chest CT 02/12/2016. FINDINGS: Right internal jugular single-lumen power porta cath with tip terminating in the distal superior vena cava. Large right upper lobe mass again noted, estimated to measure approximately 5.2 x 6.0 cm on today's plain film examination. This exerts some mass effect upon the adjacent  trachea which is deviated to the left. Lung volumes appear slightly increased in their emphysematous changes which are most pronounced throughout the mid to upper lungs. Architectural distortion in the paramediastinal aspect of the left upper lobe, similar to prior studies, presumably from prior radiation therapy. No acute consolidative airspace disease. No pleural effusions. No evidence of pulmonary edema. Heart size is normal. Atherosclerosis in the thoracic aorta. IMPRESSION: 1. No radiographic evidence of acute cardiopulmonary disease. 2. The appearance of the chest is essentially unchanged compared to recent prior studies, as above, again demonstrating a large right upper lobe mass which appears to infiltrate the mediastinum. Electronically Signed   By: Vinnie Langton M.D.   On: 02/17/2016 11:48    Microbiology: Recent Results (from the past 240 hour(s))  Culture, blood (routine x 2)     Status: None   Collection Time: 02/12/16 11:20 AM  Result Value Ref Range Status   Specimen Description BLOOD PORTA CATH  Final   Special Requests BOTTLES DRAWN AEROBIC AND ANAEROBIC 5ML  Final   Culture   Final    NO GROWTH 5 DAYS Performed at [facility]    Report Status 02/17/2016 FINAL  Final  Culture, blood (routine x 2)     Status: None   Collection Time: 02/12/16 11:47 AM  Result Value Ref Range Status   Specimen Description BLOOD RIGHT ANTECUBITAL  Final   Special Requests BOTTLES DRAWN AEROBIC AND ANAEROBIC 5ML  Final   Culture   Final    NO GROWTH 5 DAYS Performed at [facility]    Report Status 02/17/2016 FINAL  Final  Urine culture     Status: Abnormal   Collection Time: 02/12/16  2:07 PM  Result Value Ref Range Status   Specimen Description URINE, CLEAN CATCH  Final   Special Requests NONE  Final   Culture (A)  Final    9,000 COLONIES/mL INSIGNIFICANT GROWTH Performed at [facility]    Report Status 02/14/2016 FINAL  Final     Labs: Basic Metabolic  Panel:  Recent Labs Lab 02/12/16 1132 02/13/16 0121 02/14/16 0440 02/16/16 0520  NA 131* 138 140 141  K 3.6 4.2 3.9 3.8  CL 93* 105 105 102  CO2 '26 27 24 30  '$ GLUCOSE 252* 169* 187* 144*  BUN '14 10 13 14  '$ CREATININE 0.70 0.70 0.68 0.63  CALCIUM 9.0 8.8* 8.7* 8.9  MG 1.2* 2.0  --   --    Liver Function Tests:  Recent Labs Lab 02/12/16 1132  AST 17  ALT 19  ALKPHOS 67  BILITOT 1.1  PROT 6.5  ALBUMIN 3.3*   No results for input(s): LIPASE, AMYLASE in the last 168 hours. No results for input(s): AMMONIA in the last 168 hours. CBC:  Recent Labs Lab 02/12/16 1132 02/13/16 0121 02/14/16 0440 02/15/16 0750 02/16/16 0520 02/16/16 0757 02/17/16 0430  WBC 1.5* 3.5* 32.2* 79.7* 84.2* 76.4* 45.3*  NEUTROABS 0.6* 2.0 27.6*  --   --  75.6*  --   HGB 10.8* 10.4* 8.9* 9.0* 9.2* 9.2* 9.0*  HCT 31.9* 30.7* 26.0* 26.3* 27.0* 26.9* 27.0*  MCV 78.0 77.3* 77.6* 78.3 77.6* 77.7* 78.5  PLT 363 349 398 401* 377 358 293   Cardiac Enzymes:  Recent Labs Lab 02/13/16 0121 02/13/16 0845 02/17/16 1040 02/17/16 1625 02/17/16 2245  TROPONINI <0.03 <0.03 0.03 <0.03 <0.03   BNP: BNP (last 3 results)  Recent Labs  04/21/15 2316  BNP 116.7*    ProBNP (last 3 results) No results for input(s): PROBNP in the last 8760 hours.  CBG:  Recent Labs Lab 02/17/16 0720 02/17/16 1004 02/17/16 1125 02/17/16 1633 02/18/16 0716  GLUCAP 77 118* 107* 240* 101*       Signed:  Avarae Zwart  [facility] 02/18/2016, 11:10 AM

## 2016-02-19 ENCOUNTER — Telehealth: Payer: Self-pay | Admitting: *Deleted

## 2016-02-19 ENCOUNTER — Telehealth: Payer: Self-pay | Admitting: Internal Medicine

## 2016-02-19 NOTE — Telephone Encounter (Signed)
Pt discharged from hospital on 4/30. Reviewed pt's status with MD.  Per MD, pt is to have a lab, md f/u and infusion appt 5/11. PPOF to scheduling. Called notified pt to expect a call from scheduling regarding appt time on 5/11. Discussed with pt 5/2 appts, 5/9 lab appt and 5/16 lab/MD/Infusion appt will be cancelled.  Pt is to have lab/MD/Chemo on 5/11 Pt verbalized understanding no further concerns.

## 2016-02-19 NOTE — Telephone Encounter (Signed)
s.w. pt and adivsed on appts....pt ok and aware

## 2016-02-20 ENCOUNTER — Other Ambulatory Visit: Payer: Medicare Other

## 2016-02-20 ENCOUNTER — Ambulatory Visit: Payer: Medicare Other

## 2016-02-22 DIAGNOSIS — K219 Gastro-esophageal reflux disease without esophagitis: Secondary | ICD-10-CM | POA: Diagnosis not present

## 2016-02-22 DIAGNOSIS — J189 Pneumonia, unspecified organism: Secondary | ICD-10-CM | POA: Diagnosis not present

## 2016-02-22 DIAGNOSIS — K5900 Constipation, unspecified: Secondary | ICD-10-CM | POA: Diagnosis not present

## 2016-02-22 DIAGNOSIS — Z09 Encounter for follow-up examination after completed treatment for conditions other than malignant neoplasm: Secondary | ICD-10-CM | POA: Diagnosis not present

## 2016-02-24 DIAGNOSIS — J449 Chronic obstructive pulmonary disease, unspecified: Secondary | ICD-10-CM | POA: Diagnosis not present

## 2016-02-24 DIAGNOSIS — Z9981 Dependence on supplemental oxygen: Secondary | ICD-10-CM | POA: Diagnosis not present

## 2016-02-24 DIAGNOSIS — C3411 Malignant neoplasm of upper lobe, right bronchus or lung: Secondary | ICD-10-CM | POA: Diagnosis not present

## 2016-02-24 DIAGNOSIS — G893 Neoplasm related pain (acute) (chronic): Secondary | ICD-10-CM | POA: Diagnosis not present

## 2016-02-24 DIAGNOSIS — Z9221 Personal history of antineoplastic chemotherapy: Secondary | ICD-10-CM | POA: Diagnosis not present

## 2016-02-24 DIAGNOSIS — J9621 Acute and chronic respiratory failure with hypoxia: Secondary | ICD-10-CM | POA: Diagnosis not present

## 2016-02-24 DIAGNOSIS — E0965 Drug or chemical induced diabetes mellitus with hyperglycemia: Secondary | ICD-10-CM | POA: Diagnosis not present

## 2016-02-24 DIAGNOSIS — Z923 Personal history of irradiation: Secondary | ICD-10-CM | POA: Diagnosis not present

## 2016-02-27 ENCOUNTER — Encounter: Payer: Self-pay | Admitting: Internal Medicine

## 2016-02-27 ENCOUNTER — Ambulatory Visit: Payer: Medicare Other

## 2016-02-27 ENCOUNTER — Ambulatory Visit (INDEPENDENT_AMBULATORY_CARE_PROVIDER_SITE_OTHER): Payer: Medicare Other | Admitting: Internal Medicine

## 2016-02-27 ENCOUNTER — Other Ambulatory Visit: Payer: Medicare Other

## 2016-02-27 VITALS — BP 142/68 | HR 105 | Ht 64.0 in | Wt 122.2 lb

## 2016-02-27 DIAGNOSIS — J449 Chronic obstructive pulmonary disease, unspecified: Secondary | ICD-10-CM | POA: Diagnosis not present

## 2016-02-27 NOTE — Progress Notes (Signed)
Subjective:     Patient ID: Destiny Harrison, female   DOB: 1941-09-07, 75 y.o.   MRN: [id number]  HPI  OV 08/31/2015  Chief Complaint  Patient presents with  . Follow-up    Pt states overall her breathing is unchanged. Pt states she has good days and bad days. Pt c/o prod cough with clear mucus. Pt denies CP/tightness .   Follow-up moderate COPD  I have not seen Destiny Harrison in a year. She has moderate COPD. She is compliant with her nebulizers. Most recently 08/20/2015 she was discharged after admission for COPD exacerbation. I discussed this restarted her on oxygen which she says is helping her shortness of breath. Currently she feels baseline and healthy relatively speaking. CP Score is at baseline 27 which is high symptom burden. There are no new issues. She's never had a Prevnar vaccine and she is open to having it today  Past medical history  - She has advanced lung cancer. She's currently on immunotherapy Nivolumab since May 2016 fusion every 3 weeks. She is aware of pulmonary toxicity efects. Most recent CT scan chest September 2016 is reported as shrinkage of tumor without evidence of pulmonary toxicity from immunotherapy. Nxt CT scan of the chest is  November 2016. Her oncologist is following this   OV 02/27/2016  Chief Complaint  Patient presents with  . Follow-up    Pt states her SOB is unchanged since. Pt c/o mild prod cough with clear mucus. Pt denies CP/tightness.    Follow-up oxygen dependent COPD  - COPD itself is stable without any exacerbations. She continues to have high symptom burden because of her cancer and fatigue. She is on oxygen and triple inhaler/no malaise therapy.  Past medical history she has advanced lung cancer she has failed immunotherapy according to the chart review.she is now going to be on a third line agent. CT chest was in April 2017 which I only reviewed the report I did not visualize.   CAT COPD Symptom & Quality of Life Score (GSK trademark) 0 is  no burden. 5 is highest burden 08/31/2015   Never Cough -> Cough all the time 2  No phlegm in chest -> Chest is full of phlegm 3  No chest tightness -> Chest feels very tight 3  No dyspnea for 1 flight stairs/hill -> Very dyspneic for 1 flight of stairs 3  No limitations for ADL at home -> Very limited with ADL at home 4  Confident leaving home -> Not at all confident leaving home 5  Sleep soundly -> Do not sleep soundly because of lung condition 3  Lots of Energy -> No energy at all 4  TOTAL Score (max 40)  27       has a past medical history of COPD (chronic obstructive pulmonary disease) ([place]); Neutropenia, drug-induced ([place]) (05/05/2012); Hyperlipidemia; Rheumatoid arthritis(714.0); Asthma; Hiatal hernia; History of chemotherapy; History of radiation therapy (03/06/2012); History of radiation therapy (05/10/2013-05/31/2013); Radiation (11/15/13-11/26/13); Non-small cell lung cancer ([place]) (dx'd 08/28/11); Encounter for antineoplastic immunotherapy (05/22/2015); DNR (do not resuscitate) (06/26/2015); Primary cancer of right upper lobe of lung ([place]) (02/06/2012); and Dysphagia (12/07/2015).   reports that she quit smoking about 6 years ago. Her smoking use included Cigarettes. She has a 20 pack-year smoking history. She has never used smokeless tobacco.  Past Surgical History  Procedure Laterality Date  . Appendex  1962  . Video bronchoscopy  01/28/2012    Procedure: VIDEO BRONCHOSCOPY WITHOUT FLUORO;  Surgeon: Brand Males, MD;  Location:  [facility];  Service: Endoscopy;  Laterality: Bilateral;  . Surgery on right wrist      Allergies  Allergen Reactions  . Shellfish Allergy Swelling    Immunization History  Administered Date(s) Administered  . Influenza Split 07/23/2012  . Influenza Whole 09/25/2005, 09/23/2011  . Influenza,inj,Quad PF,36+ Mos 06/29/2013, 08/16/2014  . Influenza-Unspecified 06/22/2015  . Pneumococcal Conjugate-13 08/31/2015  . Pneumococcal Polysaccharide-23 11/18/2011     Family History  Problem Relation Age of Onset  . Diabetes Mother     insulin dependent  . Breast cancer Mother      Current outpatient prescriptions:  .  albuterol (PROAIR HFA) 108 (90 BASE) MCG/ACT inhaler, Inhale 2 puffs into the lungs every 6 (six) hours as needed for wheezing or shortness of breath., Disp: 1 Inhaler, Rfl: 5 .  albuterol (PROVENTIL) (2.5 MG/3ML) 0.083% nebulizer solution, Take 2.5 mg by nebulization every 8 (eight) hours., Disp: , Rfl:  .  benzonatate (TESSALON) 100 MG capsule, Take 100 mg by mouth every 8 (eight) hours as needed for cough. , Disp: , Rfl: 1 .  budesonide (PULMICORT) 0.25 MG/2ML nebulizer solution, Take 2 mLs (0.25 mg total) by nebulization 2 (two) times daily., Disp: 60 mL, Rfl: 12 .  calcium-vitamin D (OSCAL WITH D) 500-200 MG-UNIT per tablet, Take 1 tablet by mouth daily., Disp: , Rfl:  .  folic acid (FOLVITE) 956 MCG tablet, Take 400 mcg by mouth daily., Disp: , Rfl:  .  guaiFENesin (MUCINEX) 600 MG 12 hr tablet, Take 1 tablet (600 mg total) by mouth 2 (two) times daily., Disp: 30 tablet, Rfl: 1 .  HYDROcodone-acetaminophen (NORCO/VICODIN) 5-325 MG tablet, Take 1 tablet by mouth every 4 (four) hours as needed for moderate pain., Disp: 60 tablet, Rfl: 0 .  ipratropium (ATROVENT) 0.02 % nebulizer solution, Take 0.5 mg by nebulization every 8 (eight) hours., Disp: , Rfl:  .  magnesium oxide (MAG-OX) 400 (241.3 Mg) MG tablet, Take 1 tablet (400 mg total) by mouth 2 (two) times daily., Disp: 60 tablet, Rfl: 0 .  morphine (MS CONTIN) 15 MG 12 hr tablet, Take 1 tablet (15 mg total) by mouth every 12 (twelve) hours., Disp: 60 tablet, Rfl: 0 .  ondansetron (ZOFRAN ODT) 8 MG disintegrating tablet, Take 1 tablet (8 mg total) by mouth every 8 (eight) hours as needed for nausea or vomiting., Disp: 20 tablet, Rfl: 0 .  ondansetron (ZOFRAN) 8 MG tablet, Take 8 mg by mouth every 8 (eight) hours as needed. Reported on 02/01/2016, Disp: , Rfl:  .  OXYGEN, Inhale 2 L  into the lungs continuous., Disp: , Rfl:  .  prochlorperazine (COMPAZINE) 10 MG tablet, Take 1 tablet (10 mg total) by mouth every 6 (six) hours as needed for nausea or vomiting., Disp: 30 tablet, Rfl: 0 .  simvastatin (ZOCOR) 5 MG tablet, Take 10 mg by mouth at bedtime. , Disp: , Rfl:  .  diltiazem (CARDIZEM CD) 180 MG 24 hr capsule, Take 180 mg by mouth daily., Disp: , Rfl:  .  pantoprazole (PROTONIX) 40 MG tablet, Take 40 mg by mouth daily as needed (acid reflux). , Disp: , Rfl:     Review of Systems     Objective:   Physical Exam  Constitutional: She is oriented to person, place, and time. She appears well-developed and well-nourished. No distress.  HENT:  Head: Normocephalic and atraumatic.  Right Ear: External ear normal.  Left Ear: External ear normal.  Mouth/Throat: Oropharynx is clear and moist. No oropharyngeal exudate.  Eyes: Conjunctivae and EOM are normal. Pupils are equal, round, and reactive to light. Right eye exhibits no discharge. Left eye exhibits no discharge. No scleral icterus.  Neck: Normal range of motion. Neck supple. No JVD present. No tracheal deviation present. No thyromegaly present.  Cardiovascular: Normal rate, regular rhythm, normal heart sounds and intact distal pulses.  Exam reveals no gallop and no friction rub.   No murmur heard. Pulmonary/Chest: Effort normal and breath sounds normal. No respiratory distress. She has no wheezes. She has no rales. She exhibits no tenderness.  Abdominal: Soft. Bowel sounds are normal. She exhibits no distension and no mass. There is no tenderness. There is no rebound and no guarding.  Musculoskeletal: Normal range of motion. She exhibits no edema or tenderness.  Lymphadenopathy:    She has no cervical adenopathy.  Neurological: She is alert and oriented to person, place, and time. She has normal reflexes. No cranial nerve deficit. She exhibits normal muscle tone. Coordination normal.  Skin: Skin is warm and dry. No rash  noted. She is not diaphoretic. No erythema. No pallor.  Psychiatric: She has a normal mood and affect. Her behavior is normal. Judgment and thought content normal.  Vitals reviewed.   Filed Vitals:   02/27/16 1125  BP: 142/68  Pulse: 105  Height: '5\' 4"'$  (1.626 m)  Weight: 122 lb 3.2 oz (55.43 kg)  SpO2: 98%        Assessment:       ICD-9-CM ICD-10-CM   1. COPD, moderate ([place]) 496 J44.9        Plan:       #COPD  - stable diseaese - continue o2  - continue duoneb 4 times daily - use albuterol HFA as needed;  - PREVNAR vaccine 08/31/2015   #Folllowup 4 months or sooner if needed;   - oxygen test on room air at rest at followup  (> 50% of this 15 min visit spent in face to face counseling or/and coordination of care)   Dr. Brand Males, M.D., [facility].C.P Pulmonary and Critical Care Medicine Staff Physician [facility] Pager: [phone], If no answer or between  15:00h - 7:00h: call [phone]  02/27/2016 11:41 AM

## 2016-02-27 NOTE — Patient Instructions (Signed)
ICD-9-CM ICD-10-CM   1. COPD, moderate ([place]) 496 J44.9      #COPD  - stable diseaese - continue o2  - continue duoneb 4 times daily - use albuterol HFA as needed;  - PREVNAR vaccine 08/31/2015   #Folllowup 4 months or sooner if needed;   - oxygen test on room air at rest at followup

## 2016-02-29 ENCOUNTER — Other Ambulatory Visit: Payer: Medicare Other

## 2016-02-29 ENCOUNTER — Ambulatory Visit (HOSPITAL_BASED_OUTPATIENT_CLINIC_OR_DEPARTMENT_OTHER): Payer: Medicare Other | Admitting: Nurse Practitioner

## 2016-02-29 ENCOUNTER — Telehealth: Payer: Self-pay | Admitting: Internal Medicine

## 2016-02-29 ENCOUNTER — Ambulatory Visit: Payer: Medicare Other

## 2016-02-29 ENCOUNTER — Ambulatory Visit: Payer: Medicare Other | Admitting: Internal Medicine

## 2016-02-29 ENCOUNTER — Ambulatory Visit (HOSPITAL_BASED_OUTPATIENT_CLINIC_OR_DEPARTMENT_OTHER): Payer: Medicare Other

## 2016-02-29 ENCOUNTER — Other Ambulatory Visit: Payer: Self-pay | Admitting: Oncology

## 2016-02-29 ENCOUNTER — Other Ambulatory Visit (HOSPITAL_BASED_OUTPATIENT_CLINIC_OR_DEPARTMENT_OTHER): Payer: Medicare Other

## 2016-02-29 VITALS — BP 148/67 | HR 95 | Temp 97.9°F | Resp 19 | Ht 64.0 in | Wt 119.5 lb

## 2016-02-29 DIAGNOSIS — Z5112 Encounter for antineoplastic immunotherapy: Secondary | ICD-10-CM | POA: Diagnosis present

## 2016-02-29 DIAGNOSIS — Z5111 Encounter for antineoplastic chemotherapy: Secondary | ICD-10-CM

## 2016-02-29 DIAGNOSIS — C3492 Malignant neoplasm of unspecified part of left bronchus or lung: Secondary | ICD-10-CM

## 2016-02-29 DIAGNOSIS — C3411 Malignant neoplasm of upper lobe, right bronchus or lung: Secondary | ICD-10-CM

## 2016-02-29 DIAGNOSIS — C3490 Malignant neoplasm of unspecified part of unspecified bronchus or lung: Secondary | ICD-10-CM

## 2016-02-29 LAB — CBC WITH DIFFERENTIAL/PLATELET
BASO%: 0 % (ref 0.0–2.0)
Basophils Absolute: 0 10*3/uL (ref 0.0–0.1)
EOS%: 0 % (ref 0.0–7.0)
Eosinophils Absolute: 0 10*3/uL (ref 0.0–0.5)
HEMATOCRIT: 28.4 % — AB (ref 34.8–46.6)
HEMOGLOBIN: 9.4 g/dL — AB (ref 11.6–15.9)
LYMPH#: 0.7 10*3/uL — AB (ref 0.9–3.3)
LYMPH%: 6.5 % — ABNORMAL LOW (ref 14.0–49.7)
MCH: 26.6 pg (ref 25.1–34.0)
MCHC: 33.1 g/dL (ref 31.5–36.0)
MCV: 80.5 fL (ref 79.5–101.0)
MONO#: 0.2 10*3/uL (ref 0.1–0.9)
MONO%: 1.7 % (ref 0.0–14.0)
NEUT#: 10 10*3/uL — ABNORMAL HIGH (ref 1.5–6.5)
NEUT%: 91.8 % — AB (ref 38.4–76.8)
PLATELETS: 365 10*3/uL (ref 145–400)
RBC: 3.53 10*6/uL — ABNORMAL LOW (ref 3.70–5.45)
RDW: 18.3 % — AB (ref 11.2–14.5)
WBC: 10.8 10*3/uL — ABNORMAL HIGH (ref 3.9–10.3)

## 2016-02-29 LAB — COMPREHENSIVE METABOLIC PANEL
ALBUMIN: 3.6 g/dL (ref 3.5–5.0)
ALK PHOS: 87 U/L (ref 40–150)
ALT: 24 U/L (ref 0–55)
ANION GAP: 8 meq/L (ref 3–11)
AST: 11 U/L (ref 5–34)
BILIRUBIN TOTAL: 0.48 mg/dL (ref 0.20–1.20)
BUN: 20 mg/dL (ref 7.0–26.0)
CALCIUM: 9.7 mg/dL (ref 8.4–10.4)
CHLORIDE: 103 meq/L (ref 98–109)
CO2: 28 mEq/L (ref 22–29)
CREATININE: 0.8 mg/dL (ref 0.6–1.1)
EGFR: 84 mL/min/{1.73_m2} — ABNORMAL LOW (ref >90)
Glucose: 153 mg/dl — ABNORMAL HIGH (ref 70–140)
Potassium: 4.8 mEq/L (ref 3.5–5.1)
Sodium: 140 mEq/L (ref 136–145)
TOTAL PROTEIN: 6.8 g/dL (ref 6.4–8.3)

## 2016-02-29 LAB — UA PROTEIN, DIPSTICK - CHCC: PROTEIN: NEGATIVE mg/dL

## 2016-02-29 MED ORDER — ACETAMINOPHEN 325 MG PO TABS
ORAL_TABLET | ORAL | Status: AC
  Filled 2016-02-29: qty 2

## 2016-02-29 MED ORDER — SODIUM CHLORIDE 0.9 % IV SOLN
10.0000 mg/kg | Freq: Once | INTRAVENOUS | Status: AC
  Administered 2016-02-29: 600 mg via INTRAVENOUS
  Filled 2016-02-29: qty 60

## 2016-02-29 MED ORDER — DIPHENHYDRAMINE HCL 50 MG/ML IJ SOLN
INTRAMUSCULAR | Status: AC
  Filled 2016-02-29: qty 1

## 2016-02-29 MED ORDER — DIPHENHYDRAMINE HCL 50 MG/ML IJ SOLN
50.0000 mg | Freq: Once | INTRAMUSCULAR | Status: AC
  Administered 2016-02-29: 50 mg via INTRAVENOUS

## 2016-02-29 MED ORDER — SODIUM CHLORIDE 0.9 % IV SOLN
10.0000 mg | Freq: Once | INTRAVENOUS | Status: AC
  Administered 2016-02-29: 10 mg via INTRAVENOUS
  Filled 2016-02-29: qty 1

## 2016-02-29 MED ORDER — SODIUM CHLORIDE 0.9 % IV SOLN
Freq: Once | INTRAVENOUS | Status: AC
  Administered 2016-02-29: 14:00:00 via INTRAVENOUS

## 2016-02-29 MED ORDER — HYDROCODONE-ACETAMINOPHEN 5-325 MG PO TABS: 1.0000 | ORAL_TABLET | ORAL | Status: DC | PRN

## 2016-02-29 MED ORDER — SODIUM CHLORIDE 0.9 % IV SOLN
60.0000 mg/m2 | Freq: Once | INTRAVENOUS | Status: AC
  Administered 2016-02-29: 100 mg via INTRAVENOUS
  Filled 2016-02-29: qty 10

## 2016-02-29 MED ORDER — HEPARIN SOD (PORK) LOCK FLUSH 100 UNIT/ML IV SOLN
500.0000 [IU] | Freq: Once | INTRAVENOUS | Status: AC | PRN
  Administered 2016-02-29: 500 [IU]
  Filled 2016-02-29: qty 5

## 2016-02-29 MED ORDER — SODIUM CHLORIDE 0.9% FLUSH
10.0000 mL | INTRAVENOUS | Status: DC | PRN
  Administered 2016-02-29: 10 mL
  Filled 2016-02-29: qty 10

## 2016-02-29 MED ORDER — ACETAMINOPHEN 325 MG PO TABS
650.0000 mg | ORAL_TABLET | Freq: Once | ORAL | Status: AC
  Administered 2016-02-29: 650 mg via ORAL

## 2016-02-29 NOTE — Patient Instructions (Signed)
[facility] Discharge Instructions for Patients Receiving Chemotherapy  Today you received the following chemotherapy agents Cyramza andTaxotere.  To help prevent nausea and vomiting after your treatment, we encourage you to take your nausea medication as prescribed.   If you develop nausea and vomiting that is not controlled by your nausea medication, call the clinic.   BELOW ARE SYMPTOMS THAT SHOULD BE REPORTED IMMEDIATELY:  *FEVER GREATER THAN 100.5 F  *CHILLS WITH OR WITHOUT FEVER  NAUSEA AND VOMITING THAT IS NOT CONTROLLED WITH YOUR NAUSEA MEDICATION  *UNUSUAL SHORTNESS OF BREATH  *UNUSUAL BRUISING OR BLEEDING  TENDERNESS IN MOUTH AND THROAT WITH OR WITHOUT PRESENCE OF ULCERS  *URINARY PROBLEMS  *BOWEL PROBLEMS  UNUSUAL RASH Items with * indicate a potential emergency and should be followed up as soon as possible.  Feel free to call the clinic you have any questions or concerns. The clinic phone number is [phone].  Please show the [facility] at check-in to the Emergency Department and triage nurse.

## 2016-02-29 NOTE — Progress Notes (Signed)
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   PRIOR THERAPY:  1. Status post palliative radiotherapy to the left lung mass under the care of Dr. Pablo Ledger completed on 03/06/2012.  2. Systemic chemotherapy with carboplatin for AUC of 6 on day 1 and Abraxane 100 mg/M2 on days 1, 8 and 15 every 3 weeks. Status post 2 cycles. From cycle 3 forward AUC will be decreased to 4.5 given on day 1 and the Abraxane will be decreased to 90 mg per meter squared on days 1, 8 and 15 every 3 weeks, Status post a total of 3 cycles.  3. Systemic chemotherapy with carboplatin for AUC of 5 on day 1 and gemcitabine 1000 mg/m2 given on day 1 and day 8 every 3 weeks,status post 1 cycle. Due to significant neutropenia she will be dosed reduced beginning cycle 2 forward to carboplatin at an AUC of 4 given on day 1 and gemcitabine at 800 mg per meter squared given on days 1 and 8 every 3 weeks. Status post 6 cycles.  4. Status post palliative radiotherapy to the right hilum under the care of Dr. Pablo Ledger completed on 11/26/2013. 5. Immunotherapy with Nivolumab 240 MG every 2 weeks. First dose expected 03/07/2015. Status post 20 cycles, last dose was given 01/18/2016 discontinued secondary to disease progression.  CURRENT THERAPY: Systemic chemotherapy with docetaxel 75 MG/M2 and Cyramza 10 mg/KG every 3 weeks. First dose 02/06/2016.    INTERVAL HISTORY:   Ms. Dozier returns for follow-up. She completed cycle 1 docetaxel/cyramza 02/06/2016. She was hospitalized 02/12/2016 with shortness of breath. She was found to be febrile in the setting of neutropenia. She was felt to possibly have pneumonia. She was discharged 02/18/2016.  Dyspnea has returned to baseline. No fevers. She has an occasional cough. No hemoptysis. She denies nausea/vomiting. She has a single mouth sore at the left lower gumline. The mouth sore  was present prior to the first cycle of treatment. She had some numbness/tingling in her fingertips for 2-3 days following the last treatment. This has completely resolved.  Objective:  Vital signs in last 24 hours:  Blood pressure 148/67, pulse 95, temperature 97.9 F (36.6 C), temperature source Oral, resp. rate 19, height '5\' 4"'$  (1.626 m), weight 119 lb 8 oz (54.205 kg), SpO2 100 %.    HEENT: Single ulcer left lower anterior gumline. No thrush. Resp: Lungs clear bilaterally. Cardio: Regular rate and rhythm. GI: Abdomen soft and nontender. No hepatomegaly. Vascular: No leg edema. Calves soft and nontender. Port-A-Cath without erythema.   Lab Results:  Lab Results  Component Value Date   WBC 10.8* 02/29/2016   HGB 9.4* 02/29/2016   HCT 28.4* 02/29/2016   MCV 80.5 02/29/2016   PLT 365 02/29/2016   NEUTROABS 10.0* 02/29/2016    Imaging:  No results found.  Medications: I have reviewed the patient's current medications.  Assessment/Plan: 1. Metastatic non-small cell lung cancer, squamous cell carcinoma, currently on active treatment with Taxotere/cyramza. She completed cycle 1 02/06/2016. 2. Hospitalization with febrile neutropenia, possible pneumonia 02/12/2016 through 02/18/2016.   Disposition: Ms. Pulley appears stable. She has completed 1 cycle of Taxotere/cyramza. Plan to proceed with cycle 2 today as scheduled. She will again receive Neulasta support.   She was hospitalized with febrile neutropenia following cycle 1. The Taxotere will be dose reduced to 60 mg/m beginning with today's treatment per Dr. Julien Nordmann.  She will continue weekly labs. She  will return for a follow-up visit and cycle 3 Taxotere/cyramza in 3 weeks. She will contact the office in the interim with any problems.  Plan reviewed with Dr. Julien Nordmann via telephone.    Ned Card ANP/GNP-BC   02/29/2016  12:19 PM

## 2016-02-29 NOTE — Telephone Encounter (Signed)
Gave and printed appt sched and avs for pt for may adn June

## 2016-03-01 ENCOUNTER — Telehealth: Payer: Self-pay | Admitting: Internal Medicine

## 2016-03-01 ENCOUNTER — Telehealth: Payer: Self-pay | Admitting: *Deleted

## 2016-03-01 NOTE — Telephone Encounter (Signed)
"  I was in for treatment yesterday and ned an injection.  No one has told me what time I need to come in for my injection."  Informed her to come in tomorrow at 0800 am.

## 2016-03-01 NOTE — Telephone Encounter (Signed)
returned call and lvm for pt confirming appt °

## 2016-03-02 ENCOUNTER — Ambulatory Visit (HOSPITAL_BASED_OUTPATIENT_CLINIC_OR_DEPARTMENT_OTHER): Payer: Medicare Other

## 2016-03-02 VITALS — BP 157/74 | HR 98 | Temp 98.1°F | Resp 22

## 2016-03-02 DIAGNOSIS — D701 Agranulocytosis secondary to cancer chemotherapy: Secondary | ICD-10-CM

## 2016-03-02 DIAGNOSIS — C3492 Malignant neoplasm of unspecified part of left bronchus or lung: Secondary | ICD-10-CM

## 2016-03-02 DIAGNOSIS — C3411 Malignant neoplasm of upper lobe, right bronchus or lung: Secondary | ICD-10-CM

## 2016-03-02 MED ORDER — PEGFILGRASTIM INJECTION 6 MG/0.6ML ~~LOC~~
6.0000 mg | PREFILLED_SYRINGE | Freq: Once | SUBCUTANEOUS | Status: AC
  Administered 2016-03-02: 6 mg via SUBCUTANEOUS

## 2016-03-02 NOTE — Patient Instructions (Signed)
Pegfilgrastim injection What is this medicine? PEGFILGRASTIM (PEG fil gra stim) is a long-acting granulocyte colony-stimulating factor that stimulates the growth of neutrophils, a type of white blood cell important in the body's fight against infection. It is used to reduce the incidence of fever and infection in patients with certain types of cancer who are receiving chemotherapy that affects the bone marrow, and to increase survival after being exposed to high doses of radiation. This medicine may be used for other purposes; ask your health care provider or pharmacist if you have questions. What should I tell my health care provider before I take this medicine? They need to know if you have any of these conditions: -kidney disease -latex allergy -ongoing radiation therapy -sickle cell disease -skin reactions to acrylic adhesives (On-Body Injector only) -an unusual or allergic reaction to pegfilgrastim, filgrastim, other medicines, foods, dyes, or preservatives -pregnant or trying to get pregnant -breast-feeding How should I use this medicine? This medicine is for injection under the skin. If you get this medicine at home, you will be taught how to prepare and give the pre-filled syringe or how to use the On-body Injector. Refer to the patient Instructions for Use for detailed instructions. Use exactly as directed. Take your medicine at regular intervals. Do not take your medicine more often than directed. It is important that you put your used needles and syringes in a special sharps container. Do not put them in a trash can. If you do not have a sharps container, call your pharmacist or healthcare provider to get one. Talk to your pediatrician regarding the use of this medicine in children. While this drug may be prescribed for selected conditions, precautions do apply. Overdosage: If you think you have taken too much of this medicine contact a poison control center or emergency room at  once. NOTE: This medicine is only for you. Do not share this medicine with others. What if I miss a dose? It is important not to miss your dose. Call your doctor or health care professional if you miss your dose. If you miss a dose due to an On-body Injector failure or leakage, a new dose should be administered as soon as possible using a single prefilled syringe for manual use. What may interact with this medicine? Interactions have not been studied. Give your health care provider a list of all the medicines, herbs, non-prescription drugs, or dietary supplements you use. Also tell them if you smoke, drink alcohol, or use illegal drugs. Some items may interact with your medicine. This list may not describe all possible interactions. Give your health care provider a list of all the medicines, herbs, non-prescription drugs, or dietary supplements you use. Also tell them if you smoke, drink alcohol, or use illegal drugs. Some items may interact with your medicine. What should I watch for while using this medicine? You may need blood work done while you are taking this medicine. If you are going to need a MRI, CT scan, or other procedure, tell your doctor that you are using this medicine (On-Body Injector only). What side effects may I notice from receiving this medicine? Side effects that you should report to your doctor or health care professional as soon as possible: -allergic reactions like skin rash, itching or hives, swelling of the face, lips, or tongue -dizziness -fever -pain, redness, or irritation at site where injected -pinpoint red spots on the skin -red or dark-brown urine -shortness of breath or breathing problems -stomach or side pain, or pain   at the shoulder -swelling -tiredness -trouble passing urine or change in the amount of urine Side effects that usually do not require medical attention (report to your doctor or health care professional if they continue or are  bothersome): -bone pain -muscle pain This list may not describe all possible side effects. Call your doctor for medical advice about side effects. You may report side effects to FDA at 1-800-FDA-1088. Where should I keep my medicine? Keep out of the reach of children. Store pre-filled syringes in a refrigerator between 2 and 8 degrees C (36 and 46 degrees F). Do not freeze. Keep in carton to protect from light. Throw away this medicine if it is left out of the refrigerator for more than 48 hours. Throw away any unused medicine after the expiration date. NOTE: This sheet is a summary. It may not cover all possible information. If you have questions about this medicine, talk to your doctor, pharmacist, or health care provider.    2016, Elsevier/Gold Standard. (2014-10-27 14:30:14)  

## 2016-03-05 ENCOUNTER — Other Ambulatory Visit: Payer: Medicare Other

## 2016-03-05 ENCOUNTER — Ambulatory Visit: Payer: Medicare Other

## 2016-03-05 ENCOUNTER — Ambulatory Visit: Payer: Medicare Other | Admitting: Internal Medicine

## 2016-03-05 DIAGNOSIS — J9621 Acute and chronic respiratory failure with hypoxia: Secondary | ICD-10-CM | POA: Diagnosis not present

## 2016-03-05 DIAGNOSIS — J449 Chronic obstructive pulmonary disease, unspecified: Secondary | ICD-10-CM | POA: Diagnosis not present

## 2016-03-05 DIAGNOSIS — C3411 Malignant neoplasm of upper lobe, right bronchus or lung: Secondary | ICD-10-CM | POA: Diagnosis not present

## 2016-03-05 DIAGNOSIS — G893 Neoplasm related pain (acute) (chronic): Secondary | ICD-10-CM | POA: Diagnosis not present

## 2016-03-05 DIAGNOSIS — Z9981 Dependence on supplemental oxygen: Secondary | ICD-10-CM | POA: Diagnosis not present

## 2016-03-05 DIAGNOSIS — E0965 Drug or chemical induced diabetes mellitus with hyperglycemia: Secondary | ICD-10-CM | POA: Diagnosis not present

## 2016-03-07 DIAGNOSIS — R05 Cough: Secondary | ICD-10-CM | POA: Diagnosis not present

## 2016-03-07 DIAGNOSIS — I10 Essential (primary) hypertension: Secondary | ICD-10-CM | POA: Diagnosis not present

## 2016-03-12 DIAGNOSIS — J9621 Acute and chronic respiratory failure with hypoxia: Secondary | ICD-10-CM | POA: Diagnosis not present

## 2016-03-12 DIAGNOSIS — C3411 Malignant neoplasm of upper lobe, right bronchus or lung: Secondary | ICD-10-CM | POA: Diagnosis not present

## 2016-03-12 DIAGNOSIS — G893 Neoplasm related pain (acute) (chronic): Secondary | ICD-10-CM | POA: Diagnosis not present

## 2016-03-12 DIAGNOSIS — J449 Chronic obstructive pulmonary disease, unspecified: Secondary | ICD-10-CM | POA: Diagnosis not present

## 2016-03-12 DIAGNOSIS — Z9981 Dependence on supplemental oxygen: Secondary | ICD-10-CM | POA: Diagnosis not present

## 2016-03-12 DIAGNOSIS — E0965 Drug or chemical induced diabetes mellitus with hyperglycemia: Secondary | ICD-10-CM | POA: Diagnosis not present

## 2016-03-19 ENCOUNTER — Other Ambulatory Visit: Payer: Self-pay | Admitting: *Deleted

## 2016-03-19 ENCOUNTER — Telehealth: Payer: Self-pay | Admitting: *Deleted

## 2016-03-19 MED ORDER — MORPHINE SULFATE ER 15 MG PO TBCR: 15.0000 mg | EXTENDED_RELEASE_TABLET | Freq: Two times a day (BID) | ORAL | Status: DC

## 2016-03-19 NOTE — Telephone Encounter (Signed)
Called to inquire status of refill.  Instructed to pick up at 4:00 today per collaborative nurse.

## 2016-03-19 NOTE — Telephone Encounter (Signed)
"  I need a refill on morphine.  I've run out.  Call me at [phone]."

## 2016-03-19 NOTE — Telephone Encounter (Signed)
Inbasket message received from Triage pt requesting refill on pain meds- Morphine. Reviewed with MD, opk to refill pt notified.

## 2016-03-21 ENCOUNTER — Other Ambulatory Visit (HOSPITAL_BASED_OUTPATIENT_CLINIC_OR_DEPARTMENT_OTHER): Payer: Medicare Other

## 2016-03-21 ENCOUNTER — Ambulatory Visit (HOSPITAL_BASED_OUTPATIENT_CLINIC_OR_DEPARTMENT_OTHER): Payer: Medicare Other

## 2016-03-21 ENCOUNTER — Telehealth: Payer: Self-pay | Admitting: Internal Medicine

## 2016-03-21 ENCOUNTER — Encounter: Payer: Self-pay | Admitting: Internal Medicine

## 2016-03-21 ENCOUNTER — Ambulatory Visit (HOSPITAL_BASED_OUTPATIENT_CLINIC_OR_DEPARTMENT_OTHER): Payer: Medicare Other | Admitting: Internal Medicine

## 2016-03-21 VITALS — BP 148/61 | HR 118 | Temp 98.3°F | Resp 16 | Ht 64.0 in | Wt 119.4 lb

## 2016-03-21 DIAGNOSIS — Z5112 Encounter for antineoplastic immunotherapy: Secondary | ICD-10-CM

## 2016-03-21 DIAGNOSIS — C3411 Malignant neoplasm of upper lobe, right bronchus or lung: Secondary | ICD-10-CM

## 2016-03-21 DIAGNOSIS — C3491 Malignant neoplasm of unspecified part of right bronchus or lung: Secondary | ICD-10-CM

## 2016-03-21 DIAGNOSIS — Z5111 Encounter for antineoplastic chemotherapy: Secondary | ICD-10-CM

## 2016-03-21 LAB — COMPREHENSIVE METABOLIC PANEL
ALBUMIN: 3.2 g/dL — AB (ref 3.5–5.0)
ALK PHOS: 89 U/L (ref 40–150)
ALT: 11 U/L (ref 0–55)
ANION GAP: 8 meq/L (ref 3–11)
AST: 10 U/L (ref 5–34)
BUN: 20.2 mg/dL (ref 7.0–26.0)
CALCIUM: 9.9 mg/dL (ref 8.4–10.4)
CHLORIDE: 103 meq/L (ref 98–109)
CO2: 30 mEq/L — ABNORMAL HIGH (ref 22–29)
Creatinine: 0.8 mg/dL (ref 0.6–1.1)
EGFR: 83 mL/min/{1.73_m2} — AB (ref >90)
Glucose: 134 mg/dl (ref 70–140)
POTASSIUM: 5.1 meq/L (ref 3.5–5.1)
Sodium: 140 mEq/L (ref 136–145)
Total Bilirubin: 0.49 mg/dL (ref 0.20–1.20)
Total Protein: 7.2 g/dL (ref 6.4–8.3)

## 2016-03-21 LAB — CBC WITH DIFFERENTIAL/PLATELET
BASO%: 0.5 % (ref 0.0–2.0)
BASOS ABS: 0.1 10*3/uL (ref 0.0–0.1)
EOS ABS: 0 10*3/uL (ref 0.0–0.5)
EOS%: 0.2 % (ref 0.0–7.0)
HEMATOCRIT: 29.9 % — AB (ref 34.8–46.6)
HEMOGLOBIN: 9.7 g/dL — AB (ref 11.6–15.9)
LYMPH#: 0.8 10*3/uL — AB (ref 0.9–3.3)
LYMPH%: 4.9 % — ABNORMAL LOW (ref 14.0–49.7)
MCH: 27.1 pg (ref 25.1–34.0)
MCHC: 32.3 g/dL (ref 31.5–36.0)
MCV: 83.9 fL (ref 79.5–101.0)
MONO#: 0.4 10*3/uL (ref 0.1–0.9)
MONO%: 2.1 % (ref 0.0–14.0)
NEUT#: 15.4 10*3/uL — ABNORMAL HIGH (ref 1.5–6.5)
NEUT%: 92.3 % — AB (ref 38.4–76.8)
Platelets: 574 10*3/uL — ABNORMAL HIGH (ref 145–400)
RBC: 3.56 10*6/uL — ABNORMAL LOW (ref 3.70–5.45)
RDW: 21.5 % — AB (ref 11.2–14.5)
WBC: 16.7 10*3/uL — ABNORMAL HIGH (ref 3.9–10.3)

## 2016-03-21 LAB — UA PROTEIN, DIPSTICK - CHCC

## 2016-03-21 MED ORDER — SODIUM CHLORIDE 0.9% FLUSH
10.0000 mL | INTRAVENOUS | Status: DC | PRN
  Administered 2016-03-21: 10 mL
  Filled 2016-03-21: qty 10

## 2016-03-21 MED ORDER — DIPHENHYDRAMINE HCL 50 MG/ML IJ SOLN
50.0000 mg | Freq: Once | INTRAMUSCULAR | Status: AC
  Administered 2016-03-21: 50 mg via INTRAVENOUS

## 2016-03-21 MED ORDER — SODIUM CHLORIDE 0.9 % IV SOLN
60.0000 mg/m2 | Freq: Once | INTRAVENOUS | Status: AC
  Administered 2016-03-21: 100 mg via INTRAVENOUS
  Filled 2016-03-21: qty 10

## 2016-03-21 MED ORDER — HEPARIN SOD (PORK) LOCK FLUSH 100 UNIT/ML IV SOLN
500.0000 [IU] | Freq: Once | INTRAVENOUS | Status: AC | PRN
  Administered 2016-03-21: 500 [IU]
  Filled 2016-03-21: qty 5

## 2016-03-21 MED ORDER — DIPHENHYDRAMINE HCL 50 MG/ML IJ SOLN
INTRAMUSCULAR | Status: AC
  Filled 2016-03-21: qty 1

## 2016-03-21 MED ORDER — SODIUM CHLORIDE 0.9 % IV SOLN
10.0000 mg | Freq: Once | INTRAVENOUS | Status: AC
  Administered 2016-03-21: 10 mg via INTRAVENOUS
  Filled 2016-03-21: qty 1

## 2016-03-21 MED ORDER — SODIUM CHLORIDE 0.9 % IV SOLN
10.0000 mg/kg | Freq: Once | INTRAVENOUS | Status: AC
  Administered 2016-03-21: 600 mg via INTRAVENOUS
  Filled 2016-03-21: qty 60

## 2016-03-21 MED ORDER — ACETAMINOPHEN 325 MG PO TABS
ORAL_TABLET | ORAL | Status: AC
  Filled 2016-03-21: qty 2

## 2016-03-21 MED ORDER — ACETAMINOPHEN 325 MG PO TABS
650.0000 mg | ORAL_TABLET | Freq: Once | ORAL | Status: AC
  Administered 2016-03-21: 650 mg via ORAL

## 2016-03-21 MED ORDER — DIPHENHYDRAMINE HCL 25 MG PO CAPS
ORAL_CAPSULE | ORAL | Status: AC
  Filled 2016-03-21: qty 2

## 2016-03-21 MED ORDER — SODIUM CHLORIDE 0.9 % IV SOLN
Freq: Once | INTRAVENOUS | Status: AC
  Administered 2016-03-21: 13:00:00 via INTRAVENOUS

## 2016-03-21 NOTE — Progress Notes (Signed)
[facility] [phone]   Fax:(336) [place], [place], [address] [place] [address]  DIAGNOSIS: Metastatic non-small cell lung cancer, squamous cell carcinoma diagnosed in March of 2013.   PRIOR THERAPY:  1. Status post palliative radiotherapy to the left lung mass under the care of Dr. Pablo Ledger completed on 03/06/2012.  2. Systemic chemotherapy with carboplatin for AUC of 6 on day 1 and Abraxane 100 mg/M2 on days 1, 8 and 15 every 3 weeks. Status post 2 cycles. From cycle 3 forward AUC will be decreased to 4.5 given on day 1 and the Abraxane will be decreased to 90 mg per meter squared on days 1, 8 and 15 every 3 weeks, Status post a total of 3 cycles.  3. Systemic chemotherapy with carboplatin for AUC of 5 on day 1 and gemcitabine 1000 mg/m2 given on day 1 and day 8 every 3 weeks,status post 1 cycle. Due to significant neutropenia she will be dosed reduced beginning cycle 2 forward to carboplatin at an AUC of 4 given on day 1 and gemcitabine at 800 mg per meter squared given on days 1 and 8 every 3 weeks. Status post 6 cycles.  4. Status post palliative radiotherapy to the right hilum under the care of Dr. Pablo Ledger completed on 11/26/2013. 5. Immunotherapy with Nivolumab 240 MG every 2 weeks. First dose expected 03/07/2015. Status post 20 cycles, last dose was given 01/18/2016 discontinued secondary to disease progression.  CURRENT THERAPY: Systemic chemotherapy with docetaxel 75 MG/M2 and Cyramza 10 mg/KG every 3 weeks. First dose 02/08/2016. Status post 2 cycles.  CHEMOTHERAPY INTENT: Palliative  CURRENT # OF CHEMOTHERAPY CYCLES: 3  CURRENT ANTIEMETICS: Compazine  CURRENT SMOKING STATUS: Former smoker  ORAL CHEMOTHERAPY AND CONSENT: None  CURRENT BISPHOSPHONATES USE: None  PAIN MANAGEMENT: 5/10 right shoulder currently on Norco  NARCOTICS INDUCED CONSTIPATION: None  LIVING WILL AND CODE  STATUS: No CODE BLUE   INTERVAL HISTORY: Baby Destiny Harrison 75 y.o. female returns to the clinic today for followup visit accompanied by her daughter-in-law. The patient is feeling fine today except for the baseline shortness of breath and she is currently on home oxygen. She is tolerating her current treatment with docetaxel and Cyramza fairly well with no significant complaints. She noticed improvement in her swallowing since starting the chemotherapy. She denied having any significant chest pain but continues to have cough with no hemoptysis. She has no significant weight loss or night sweats. The patient denied having any significant fever or chills, nausea or vomiting. She is here today to start cycle #3 of her chemotherapy.  MEDICAL HISTORY: Past Medical History  Diagnosis Date  . COPD (chronic obstructive pulmonary disease) ([facility])   . Neutropenia, drug-induced ([facility]) 05/05/2012  . Hyperlipidemia   . Rheumatoid arthritis(714.0)   . Asthma   . Hiatal hernia   . History of chemotherapy   . History of radiation therapy 03/06/2012    left hilum  . History of radiation therapy 05/10/2013-05/31/2013    Left lung/ 33/75'@2'$ .25 per fraction x 15 fractions  . Radiation 11/15/13-11/26/13    Right hilum 30 Gy in 10 fractions  . Non-small cell lung cancer ([facility]) dx'd 08/28/11    left lung  . Encounter for antineoplastic immunotherapy 05/22/2015  . DNR (do not resuscitate) 06/26/2015  . Primary cancer of right upper lobe of lung ([facility]) 02/06/2012  . Dysphagia 12/07/2015    ALLERGIES:  is allergic to shellfish allergy.  MEDICATIONS:  Current Outpatient Prescriptions  Medication Sig Dispense Refill  . albuterol (PROAIR HFA) 108 (90 BASE) MCG/ACT inhaler Inhale 2 puffs into the lungs every 6 (six) hours as needed for wheezing or shortness of breath. 1 Inhaler 5  . albuterol (PROVENTIL) (2.5 MG/3ML) 0.083% nebulizer solution Take 2.5 mg by nebulization every 8 (eight) hours.    . benzonatate (TESSALON) 100 MG capsule  Take 100 mg by mouth every 8 (eight) hours as needed for cough.   1  . budesonide (PULMICORT) 0.25 MG/2ML nebulizer solution Take 2 mLs (0.25 mg total) by nebulization 2 (two) times daily. 60 mL 12  . calcium-vitamin D (OSCAL WITH D) 500-200 MG-UNIT per tablet Take 1 tablet by mouth daily.    Marland Kitchen dexamethasone (DECADRON) 4 MG tablet   2  . folic acid (FOLVITE) 628 MCG tablet Take 400 mcg by mouth daily.    Marland Kitchen guaiFENesin (MUCINEX) 600 MG 12 hr tablet Take 1 tablet (600 mg total) by mouth 2 (two) times daily. 30 tablet 1  . HYDROcodone-acetaminophen (NORCO/VICODIN) 5-325 MG tablet Take 1 tablet by mouth every 4 (four) hours as needed for moderate pain. 60 tablet 0  . ipratropium (ATROVENT) 0.02 % nebulizer solution Take 0.5 mg by nebulization every 8 (eight) hours.    . magnesium oxide (MAG-OX) 400 (241.3 Mg) MG tablet Take 1 tablet (400 mg total) by mouth 2 (two) times daily. 60 tablet 0  . morphine (MS CONTIN) 15 MG 12 hr tablet Take 1 tablet (15 mg total) by mouth every 12 (twelve) hours. 60 tablet 0  . OXYGEN Inhale 2 L into the lungs continuous.    . polyethylene glycol (MIRALAX / GLYCOLAX) packet MX AND DRK 1 PACKET PO QD MIXED WITH 8 OUNCES OF FLUID  0  . predniSONE (DELTASONE) 5 MG tablet Take 5 mg by mouth daily.    . simvastatin (ZOCOR) 5 MG tablet Take 10 mg by mouth at bedtime.     Marland Kitchen diltiazem (CARDIZEM CD) 180 MG 24 hr capsule Take 180 mg by mouth daily.    . ondansetron (ZOFRAN ODT) 8 MG disintegrating tablet Take 1 tablet (8 mg total) by mouth every 8 (eight) hours as needed for nausea or vomiting. (Patient not taking: Reported on 03/21/2016) 20 tablet 0  . ondansetron (ZOFRAN) 8 MG tablet Take 8 mg by mouth every 8 (eight) hours as needed. Reported on 02/01/2016    . pantoprazole (PROTONIX) 40 MG tablet Take 40 mg by mouth daily as needed (acid reflux).     . prochlorperazine (COMPAZINE) 10 MG tablet Take 1 tablet (10 mg total) by mouth every 6 (six) hours as needed for nausea or vomiting.  (Patient not taking: Reported on 03/21/2016) 30 tablet 0   No current facility-administered medications for this visit.    SURGICAL HISTORY:  Past Surgical History  Procedure Laterality Date  . Appendex  1962  . Video bronchoscopy  01/28/2012    Procedure: VIDEO BRONCHOSCOPY WITHOUT FLUORO;  Surgeon: Brand Males, MD;  Location[facility] ENDOSCOPY;  Service: Endoscopy;  Laterality: Bilateral;  . Surgery on right wrist      REVIEW OF SYSTEMS:  A comprehensive review of systems was negative except for: Constitutional: positive for fatigue Respiratory: positive for dyspnea on exertion   PHYSICAL EXAMINATION: General appearance: alert, cooperative and no distress Head: Normocephalic, without obvious abnormality, atraumatic Neck: no adenopathy, no JVD, supple, symmetrical, trachea midline and thyroid not enlarged, symmetric, no tenderness/mass/nodules Lymph nodes: Cervical, supraclavicular, and axillary nodes normal.  Resp: clear to auscultation bilaterally Back: symmetric, no curvature. ROM normal. No CVA tenderness. Cardio: regular rate and rhythm, S1, S2 normal, no murmur, click, rub or gallop GI: soft, non-tender; bowel sounds normal; no masses,  no organomegaly Extremities: extremities normal, atraumatic, no cyanosis or edema Neurologic: Alert and oriented X 3, normal strength and tone. Normal symmetric reflexes. Normal coordination and gait  ECOG PERFORMANCE STATUS: 1 - Symptomatic but completely ambulatory  Blood pressure 148/61, pulse 118, temperature 98.3 F (36.8 C), temperature source Oral, resp. rate 16, height '5\' 4"'$  (1.626 m), weight 119 lb 6.4 oz (54.159 kg), SpO2 100 %.  LABORATORY DATA: Lab Results  Component Value Date   WBC 16.7* 03/21/2016   HGB 9.7* 03/21/2016   HCT 29.9* 03/21/2016   MCV 83.9 03/21/2016   PLT 574* 03/21/2016      Chemistry      Component Value Date/Time   NA 140 02/29/2016 1044   NA 141 02/16/2016 0520   K 4.8 02/29/2016 1044   K 3.8  02/16/2016 0520   CL 102 02/16/2016 0520   CL 101 01/12/2013 0810   CO2 28 02/29/2016 1044   CO2 30 02/16/2016 0520   BUN 20.0 02/29/2016 1044   BUN 14 02/16/2016 0520   CREATININE 0.8 02/29/2016 1044   CREATININE 0.63 02/16/2016 0520      Component Value Date/Time   CALCIUM 9.7 02/29/2016 1044   CALCIUM 8.9 02/16/2016 0520   ALKPHOS 87 02/29/2016 1044   ALKPHOS 67 02/12/2016 1132   AST 11 02/29/2016 1044   AST 17 02/12/2016 1132   ALT 24 02/29/2016 1044   ALT 19 02/12/2016 1132   BILITOT 0.48 02/29/2016 1044   BILITOT 1.1 02/12/2016 1132       RADIOGRAPHIC STUDIES: No results found. ASSESSMENT AND PLAN: This is a very pleasant 75 years old African American female with metastatic non-small cell lung cancer, squamous cell carcinoma status post several chemotherapy regimen and and completed a course of palliative radiotherapy to the right hilum in February 2015.  She completed a course of treatment with immunotherapy with Nivolumab status post 20 cycles. This was discontinued today secondary to disease progression with further enlargement of the large right paratracheal lymph node. I discussed the scan results and showed the images to the patient and her son. I recommended for her to discontinue her current treatment with Nivolumab at this point. She is currently on systemic chemotherapy with docetaxel 75 MG/M2 and Cyramza 10 MG/KG every 3 weeks with Neulasta support. Status post 2 cycles and tolerating her treatment well. I recommended for the patient to proceed with cycle #3 today as a scheduled. She would come back for follow-up visit in 3 weeks for evaluation with repeat CT scan of the chest, abdomen and pelvis for restaging of her disease before starting cycle #4. For pain management, she will continue on MS Contin in addition to Vicodin. For the cough, she will continue on Tussinox. She was advised to call immediately if she has any concerning symptoms in the interval.  The  patient voices understanding of current disease status and treatment options and is in agreement with the current care plan.  All questions were answered. The patient knows to call the clinic with any problems, questions or concerns. We can certainly see the patient much sooner if necessary.  Disclaimer: This note was dictated with voice recognition software. Similar sounding words can inadvertently be transcribed and may not be corrected upon review.

## 2016-03-21 NOTE — Telephone Encounter (Signed)
Gave and printed appt sched and avs fo rpt for June...gv barium

## 2016-03-22 ENCOUNTER — Telehealth: Payer: Self-pay | Admitting: Internal Medicine

## 2016-03-22 NOTE — Telephone Encounter (Signed)
Spoke with pt. She needs to be requalified for oxygen. I have scheduled her to see SG on 03/27/16 at 9:30am. Nothing further was needed.

## 2016-03-23 ENCOUNTER — Ambulatory Visit (HOSPITAL_BASED_OUTPATIENT_CLINIC_OR_DEPARTMENT_OTHER): Payer: Medicare Other

## 2016-03-23 VITALS — BP 143/72 | HR 95 | Temp 98.6°F | Resp 19 | Ht 64.0 in

## 2016-03-23 DIAGNOSIS — C3491 Malignant neoplasm of unspecified part of right bronchus or lung: Secondary | ICD-10-CM | POA: Diagnosis present

## 2016-03-23 DIAGNOSIS — C3411 Malignant neoplasm of upper lobe, right bronchus or lung: Secondary | ICD-10-CM

## 2016-03-23 MED ORDER — PEGFILGRASTIM INJECTION 6 MG/0.6ML ~~LOC~~
6.0000 mg | PREFILLED_SYRINGE | Freq: Once | SUBCUTANEOUS | Status: AC
  Administered 2016-03-23: 6 mg via SUBCUTANEOUS
  Filled 2016-03-23: qty 0.6

## 2016-03-26 ENCOUNTER — Other Ambulatory Visit: Payer: Self-pay

## 2016-03-26 ENCOUNTER — Encounter (HOSPITAL_COMMUNITY): Payer: Self-pay

## 2016-03-26 ENCOUNTER — Emergency Department (HOSPITAL_COMMUNITY)
Admission: EM | Admit: 2016-03-26 | Discharge: 2016-03-26 | Disposition: A | Discharge status: Home / Self Care | Payer: Medicare Other | Attending: Emergency Medicine | Admitting: Emergency Medicine

## 2016-03-26 ENCOUNTER — Emergency Department (HOSPITAL_COMMUNITY): Payer: Medicare Other

## 2016-03-26 DIAGNOSIS — C3490 Malignant neoplasm of unspecified part of unspecified bronchus or lung: Secondary | ICD-10-CM | POA: Insufficient documentation

## 2016-03-26 DIAGNOSIS — R1110 Vomiting, unspecified: Secondary | ICD-10-CM | POA: Diagnosis not present

## 2016-03-26 DIAGNOSIS — C7800 Secondary malignant neoplasm of unspecified lung: Secondary | ICD-10-CM | POA: Diagnosis not present

## 2016-03-26 DIAGNOSIS — C3492 Malignant neoplasm of unspecified part of left bronchus or lung: Secondary | ICD-10-CM | POA: Diagnosis not present

## 2016-03-26 DIAGNOSIS — J449 Chronic obstructive pulmonary disease, unspecified: Secondary | ICD-10-CM | POA: Insufficient documentation

## 2016-03-26 DIAGNOSIS — R109 Unspecified abdominal pain: Secondary | ICD-10-CM | POA: Diagnosis not present

## 2016-03-26 DIAGNOSIS — M069 Rheumatoid arthritis, unspecified: Secondary | ICD-10-CM | POA: Diagnosis not present

## 2016-03-26 DIAGNOSIS — Z87891 Personal history of nicotine dependence: Secondary | ICD-10-CM | POA: Diagnosis not present

## 2016-03-26 DIAGNOSIS — Z79899 Other long term (current) drug therapy: Secondary | ICD-10-CM | POA: Insufficient documentation

## 2016-03-26 DIAGNOSIS — R0602 Shortness of breath: Secondary | ICD-10-CM | POA: Diagnosis present

## 2016-03-26 DIAGNOSIS — K5900 Constipation, unspecified: Secondary | ICD-10-CM | POA: Diagnosis not present

## 2016-03-26 LAB — CBC
HEMATOCRIT: 29.9 % — AB (ref 36.0–46.0)
HEMOGLOBIN: 9.9 g/dL — AB (ref 12.0–15.0)
MCH: 27.2 pg (ref 26.0–34.0)
MCHC: 33.1 g/dL (ref 30.0–36.0)
MCV: 82.1 fL (ref 78.0–100.0)
Platelets: 323 10*3/uL (ref 150–400)
RBC: 3.64 MIL/uL — ABNORMAL LOW (ref 3.87–5.11)
RDW: 18.8 % — ABNORMAL HIGH (ref 11.5–15.5)
WBC: 11.4 10*3/uL — AB (ref 4.0–10.5)

## 2016-03-26 LAB — HEPATIC FUNCTION PANEL
ALT: 13 U/L — ABNORMAL LOW (ref 14–54)
AST: 14 U/L — ABNORMAL LOW (ref 15–41)
Albumin: 3.5 g/dL (ref 3.5–5.0)
Alkaline Phosphatase: 88 U/L (ref 38–126)
BILIRUBIN DIRECT: 0.1 mg/dL (ref 0.1–0.5)
BILIRUBIN INDIRECT: 1 mg/dL — AB (ref 0.3–0.9)
BILIRUBIN TOTAL: 1.1 mg/dL (ref 0.3–1.2)
Total Protein: 6.2 g/dL — ABNORMAL LOW (ref 6.5–8.1)

## 2016-03-26 LAB — BASIC METABOLIC PANEL
Anion gap: 8 (ref 5–15)
BUN: 18 mg/dL (ref 6–20)
CHLORIDE: 100 mmol/L — AB (ref 101–111)
CO2: 28 mmol/L (ref 22–32)
Calcium: 8.9 mg/dL (ref 8.9–10.3)
Creatinine, Ser: 0.73 mg/dL (ref 0.44–1.00)
GFR calc Af Amer: >60 mL/min (ref >60)
GLUCOSE: 122 mg/dL — AB (ref 65–99)
Potassium: 3.4 mmol/L — ABNORMAL LOW (ref 3.5–5.1)
Sodium: 136 mmol/L (ref 135–145)

## 2016-03-26 LAB — I-STAT TROPONIN, ED: Troponin i, poc: 0 ng/mL (ref 0.00–0.08)

## 2016-03-26 LAB — LIPASE, BLOOD: Lipase: 22 U/L (ref 11–51)

## 2016-03-26 LAB — BRAIN NATRIURETIC PEPTIDE: B NATRIURETIC PEPTIDE 5: 183.6 pg/mL — AB (ref 0.0–100.0)

## 2016-03-26 LAB — APTT: APTT: 33 s (ref 24–37)

## 2016-03-26 LAB — PROTIME-INR
INR: 1.09 (ref 0.00–1.49)
Prothrombin Time: 14.3 seconds (ref 11.6–15.2)

## 2016-03-26 MED ORDER — HEPARIN SOD (PORK) LOCK FLUSH 100 UNIT/ML IV SOLN
500.0000 [IU] | Freq: Once | INTRAVENOUS | Status: AC
  Administered 2016-03-26: 500 [IU]
  Filled 2016-03-26: qty 5

## 2016-03-26 MED ORDER — MORPHINE SULFATE (PF) 4 MG/ML IV SOLN
4.0000 mg | INTRAVENOUS | Status: DC | PRN
Start: 2016-03-26 — End: 2016-03-26
  Administered 2016-03-26: 4 mg via INTRAVENOUS
  Filled 2016-03-26: qty 1

## 2016-03-26 MED ORDER — IPRATROPIUM-ALBUTEROL 0.5-2.5 (3) MG/3ML IN SOLN
3.0000 mL | Freq: Once | RESPIRATORY_TRACT | Status: AC
  Administered 2016-03-26: 3 mL via RESPIRATORY_TRACT
  Filled 2016-03-26: qty 3

## 2016-03-26 MED ORDER — ONDANSETRON HCL 4 MG/2ML IJ SOLN
4.0000 mg | Freq: Once | INTRAMUSCULAR | Status: AC
  Administered 2016-03-26: 4 mg via INTRAVENOUS
  Filled 2016-03-26: qty 2

## 2016-03-26 MED ORDER — SODIUM CHLORIDE 0.9 % IV SOLN
INTRAVENOUS | Status: DC
  Administered 2016-03-26: 14:00:00 via INTRAVENOUS

## 2016-03-26 MED ORDER — HYDROCODONE-ACETAMINOPHEN 5-325 MG PO TABS
1.0000 | ORAL_TABLET | Freq: Once | ORAL | Status: AC
  Administered 2016-03-26: 1 via ORAL
  Filled 2016-03-26: qty 1

## 2016-03-26 NOTE — ED Notes (Signed)
Patient reports that she has lung cancer. Patient c/o increased SOB, vomiting, and chest pain. Patient states she has been unable to take pain medication due to vomiting that started yesterday. Patient wears continuous home O2 at 2L/min via [place].

## 2016-03-26 NOTE — Discharge Instructions (Signed)
Lung Cancer °Lung cancer occurs when abnormal cells in the lung grow out of control and form a mass (tumor). There are several types of lung cancer. The two most common types are: °· Non-small cell. In this type of lung cancer, abnormal cells are larger and grow more slowly than those of small cell lung cancer. °· Small cell. In this type of lung cancer, abnormal cells are smaller than those of non-small cell lung cancer. Small cell lung cancer gets worse faster than non-small cell lung cancer. °CAUSES  °The leading cause of lung cancer is smoking tobacco. The second leading cause is radon exposure. °RISK FACTORS °· Smoking tobacco. °· Exposure to secondhand tobacco smoke. °· Exposure to radon gas. °· Exposure to asbestos. °· Exposure to arsenic in drinking water. °· Air pollution. °· Family or personal history of lung cancer. °· Lung radiation therapy. °· Being older than 65 years. °SIGNS AND SYMPTOMS  °In the early stages, symptoms may not be present. As the cancer progresses, symptoms may include: °· A lasting cough, possibly with blood. °· Fatigue. °· Unexplained weight loss. °· Shortness of breath. °· Wheezing. °· Chest pain. °· Loss of appetite. °Symptoms of advanced lung cancer include: °· Hoarseness. °· Bone or joint pain. °· Weakness. °· Nail problems. °· Face or arm swelling. °· Paralysis of the face. °· Drooping eyelids. °DIAGNOSIS  °Lung cancer can be identified with a physical exam and with tests such as: °· A chest X-ray. °· A CT scan. °· Blood tests. °· A biopsy. °After a diagnosis is made, you will have more tests to determine the stage of the cancer. The stages of non-small cell lung cancer are: °· Stage 0, also called carcinoma in situ. At this stage, abnormal cells are found in the inner lining of your lung or lungs. °· Stage I. At this stage, abnormal cells have grown into a tumor that is no larger than 5 cm across. The cancer has entered the deeper lung tissue but has not yet entered the lymph  nodes or other parts of the body. °· Stage II. At this stage, the tumor is 7 cm across or smaller and has entered nearby lymph nodes. Or, the tumor is 5 cm across or smaller and has invaded surrounding tissue but is not found in nearby lymph nodes. There may be more than one tumor present. °· Stage III. At this stage, the tumor may be any size. There may be more than one tumor in the lungs. The cancer cells have spread to the lymph nodes and possibly to other organs. °· Stage IV. At this stage, there are tumors in both lungs and the cancer has spread to other areas of the body. °The stages of small cell lung cancer are: °· Limited. At this stage, the cancer is found only on one side of the chest. °· Extensive. At this stage, the cancer is in the lungs and in tissues on the other side of the chest. The cancer has spread to other organs or is found in the fluid between the layers of your lungs. °TREATMENT  °Depending on the type and stage of your lung cancer, you may be treated with: °· Surgery. This is done to remove a tumor. °· Radiation therapy. This treatment destroys cancer cells using X-rays or other types of radiation. °· Chemotherapy. This treatment uses medicines to destroy cancer cells. °· Targeted therapy. This treatment aims to destroy only cancer cells instead of all cells as other therapies do. °You may   also have a combination of treatments. °HOME CARE INSTRUCTIONS  °· Do not use any tobacco products. This includes cigarettes, chewing tobacco, and electronic cigarettes. If you need help quitting, ask your health care provider. °· Take medicines only as directed by your health care provider. °· Eat a healthy diet. Work with a dietitian to make sure you are getting the nutrition you need. °· Consider joining a support group or seeking counseling to help you cope with the stress of having lung cancer. °· Let your cancer specialist (oncologist) know if you are admitted to the hospital. °· Keep all follow-up  visits as directed by your health care provider. This is important. °SEEK MEDICAL CARE IF:  °· You lose weight without trying. °· You have a persistent cough and wheezing. °· You feel short of breath. °· You tire easily. °· You experience bone or joint pain. °· You have difficulty swallowing. °· You feel hoarse or notice your voice changing. °· Your pain medicine is not helping. °SEEK IMMEDIATE MEDICAL CARE IF:  °· You cough up blood. °· You have new breathing problems. °· You develop chest pain. °· You develop swelling in: °¨ One or both ankles or legs. °¨ Your face, neck, or arms. °· You are confused. °· You experience paralysis in your face or a drooping eyelid. °  °This information is not intended to replace advice given to you by your health care provider. Make sure you discuss any questions you have with your health care provider. °  °Document Released: 01/13/2001 Document Revised: 06/28/2015 Document Reviewed: 02/10/2014 °[employer]vier Interactive Patient Education ©2016 [employer] ° °

## 2016-03-26 NOTE — ED Provider Notes (Signed)
CSN: [id number]     Arrival date & time 03/26/16  1213 History   First MD Initiated Contact with Patient 03/26/16 1247     Chief Complaint  Patient presents with  . Shortness of Breath  . Emesis  . Chest Pain   HPI Pt has a history of stage 4 lung CA, currently receiving palliative chemo.  She oncologist last week and at that time was doing well.  Her pain was managed and she was breathing normally on her nasal cannula oxygen.  Her last treatment was June 1st.  Yesterday she began having increasing pain in her chest and abdomen.  She vomited twice yesterday and this am.  She tried her pain medication but she vomited.    No fever that she is aware of.  No diarrhea or dysuria. Past Medical History  Diagnosis Date  . COPD (chronic obstructive pulmonary disease) ([place])   . Neutropenia, drug-induced ([place]) 05/05/2012  . Hyperlipidemia   . Rheumatoid arthritis(714.0)   . Asthma   . Hiatal hernia   . History of chemotherapy   . History of radiation therapy 03/06/2012    left hilum  . History of radiation therapy 05/10/2013-05/31/2013    Left lung/ 33/75'@2'$ .25 per fraction x 15 fractions  . Radiation 11/15/13-11/26/13    Right hilum 30 Gy in 10 fractions  . Non-small cell lung cancer ([place]) dx'd 08/28/11    left lung  . Encounter for antineoplastic immunotherapy 05/22/2015  . DNR (do not resuscitate) 06/26/2015  . Primary cancer of right upper lobe of lung ([place]) 02/06/2012  . Dysphagia 12/07/2015   Past Surgical History  Procedure Laterality Date  . Appendex  1962  . Video bronchoscopy  01/28/2012    Procedure: VIDEO BRONCHOSCOPY WITHOUT FLUORO;  Surgeon: Brand Males, MD;  Location: [facility] ENDOSCOPY;  Service: Endoscopy;  Laterality: Bilateral;  . Surgery on right wrist     Family History  Problem Relation Age of Onset  . Diabetes Mother     insulin dependent  . Breast cancer Mother    Social History  Substance Use Topics  . Smoking status: Former Smoker -- 0.50 packs/day for 40 years    Types:  Cigarettes    Quit date: 10/29/2009  . Smokeless tobacco: Never Used  . Alcohol Use: No   OB History    No data available     Review of Systems  All other systems reviewed and are negative.     Allergies  Shellfish allergy  Home Medications   Prior to Admission medications   Medication Sig Start Date End Date Taking? Authorizing Provider  albuterol (PROAIR HFA) 108 (90 BASE) MCG/ACT inhaler Inhale 2 puffs into the lungs every 6 (six) hours as needed for wheezing or shortness of breath. 08/31/15   Brand Males, MD  albuterol (PROVENTIL) (2.5 MG/3ML) 0.083% nebulizer solution Take 2.5 mg by nebulization every 8 (eight) hours.    Historical Provider, MD  benzonatate (TESSALON) 100 MG capsule Take 100 mg by mouth every 8 (eight) hours as needed for cough.  01/05/16   Historical Provider, MD  budesonide (PULMICORT) 0.25 MG/2ML nebulizer solution Take 2 mLs (0.25 mg total) by nebulization 2 (two) times daily. 08/20/15   Belkys A Regalado, MD  calcium-vitamin D (OSCAL WITH D) 500-200 MG-UNIT per tablet Take 1 tablet by mouth daily.    Historical Provider, MD  dexamethasone (DECADRON) 4 MG tablet  02/01/16   Historical Provider, MD  diltiazem (CARDIZEM CD) 180 MG 24 hr capsule Take 180  mg by mouth daily. 01/16/16 02/15/16  Historical Provider, MD  folic acid (FOLVITE) 161 MCG tablet Take 400 mcg by mouth daily.    Historical Provider, MD  guaiFENesin (MUCINEX) 600 MG 12 hr tablet Take 1 tablet (600 mg total) by mouth 2 (two) times daily. 06/27/15   Reyne Dumas, MD  HYDROcodone-acetaminophen (NORCO/VICODIN) 5-325 MG tablet Take 1 tablet by mouth every 4 (four) hours as needed for moderate pain. 02/29/16   Owens Shark, NP  ipratropium (ATROVENT) 0.02 % nebulizer solution Take 0.5 mg by nebulization every 8 (eight) hours.    Historical Provider, MD  magnesium oxide (MAG-OX) 400 (241.3 Mg) MG tablet Take 1 tablet (400 mg total) by mouth 2 (two) times daily. 02/18/16   Maryann Mikhail, DO   morphine (MS CONTIN) 15 MG 12 hr tablet Take 1 tablet (15 mg total) by mouth every 12 (twelve) hours. 03/19/16   Curt Bears, MD  ondansetron (ZOFRAN ODT) 8 MG disintegrating tablet Take 1 tablet (8 mg total) by mouth every 8 (eight) hours as needed for nausea or vomiting. Patient not taking: Reported on 03/21/2016 04/04/15   Maryanna Shape, NP  ondansetron (ZOFRAN) 8 MG tablet Take 8 mg by mouth every 8 (eight) hours as needed. Reported on 02/01/2016    Historical Provider, MD  OXYGEN Inhale 2 L into the lungs continuous.    Historical Provider, MD  pantoprazole (PROTONIX) 40 MG tablet Take 40 mg by mouth daily as needed (acid reflux).  01/16/16 02/15/16  Historical Provider, MD  polyethylene glycol (MIRALAX / GLYCOLAX) packet MX AND DRK 1 PACKET PO QD MIXED WITH 8 OUNCES OF FLUID 02/22/16   Historical Provider, MD  predniSONE (DELTASONE) 5 MG tablet Take 5 mg by mouth daily. 02/28/16   Historical Provider, MD  prochlorperazine (COMPAZINE) 10 MG tablet Take 1 tablet (10 mg total) by mouth every 6 (six) hours as needed for nausea or vomiting. Patient not taking: Reported on 03/21/2016 03/21/15   Carlton Adam, PA-C  simvastatin (ZOCOR) 5 MG tablet Take 10 mg by mouth at bedtime.     Historical Provider, MD   BP 110/78 mmHg  Pulse 118  Temp(Src) 99.5 F (37.5 C) (Oral)  Resp 21  Ht 5' 4.5" (1.638 m)  Wt 54.035 kg  BMI 20.14 kg/m2  SpO2 100% Physical Exam  Constitutional: She appears distressed.  HENT:  Head: Normocephalic and atraumatic.  Right Ear: External ear normal.  Left Ear: External ear normal.  Eyes: Conjunctivae are normal. Right eye exhibits no discharge. Left eye exhibits no discharge. No scleral icterus.  Neck: Neck supple. No tracheal deviation present.  Cardiovascular: Regular rhythm and intact distal pulses.  Tachycardia present.   Pulmonary/Chest: Accessory muscle usage present. No stridor. No respiratory distress. She has no wheezes. She has no rhonchi. She has no rales.   Abdominal: Soft. Bowel sounds are normal. She exhibits no distension. There is no tenderness. There is no rebound and no guarding.  Musculoskeletal: She exhibits no edema or tenderness.  Neurological: She is alert. She has normal strength. No cranial nerve deficit (no facial droop, extraocular movements intact, no slurred speech) or sensory deficit. She exhibits normal muscle tone. She displays no seizure activity. Coordination normal.  Skin: Skin is warm and dry. No rash noted.  Psychiatric: She has a normal mood and affect.  Nursing note and vitals reviewed.   ED Course  Procedures (including critical care time)  Medications  0.9 %  sodium chloride infusion ( Intravenous New  Bag/Given 03/26/16 1348)  morphine 4 MG/ML injection 4 mg (4 mg Intravenous Given 03/26/16 1350)  ondansetron (ZOFRAN) injection 4 mg (4 mg Intravenous Given 03/26/16 1348)  ipratropium-albuterol (DUONEB) 0.5-2.5 (3) MG/3ML nebulizer solution 3 mL (3 mLs Nebulization Given 03/26/16 1411)    Labs Review Labs Reviewed  BASIC METABOLIC PANEL - Abnormal; Notable for the following:    Potassium 3.4 (*)    Chloride 100 (*)    Glucose, Bld 122 (*)    All other components within normal limits  CBC - Abnormal; Notable for the following:    WBC 11.4 (*)    RBC 3.64 (*)    Hemoglobin 9.9 (*)    HCT 29.9 (*)    RDW 18.8 (*)    All other components within normal limits  BRAIN NATRIURETIC PEPTIDE - Abnormal; Notable for the following:    B Natriuretic Peptide 183.6 (*)    All other components within normal limits  HEPATIC FUNCTION PANEL - Abnormal; Notable for the following:    Total Protein 6.2 (*)    AST 14 (*)    ALT 13 (*)    Indirect Bilirubin 1.0 (*)    All other components within normal limits  APTT  PROTIME-INR  LIPASE, BLOOD  I-STAT TROPOININ, ED  I-STAT TROPOININ, ED    Imaging Review Dg Chest 2 View  03/26/2016  CLINICAL DATA:  Stage IV non-small-cell lung cancer, increasing and currently significant  shortness of breath, history COPD, asthma, prior radiation therapy, rheumatoid arthritis EXAM: CHEST  2 VIEW COMPARISON:  02/17/2016 ; correlation CT chest 02/12/2016 FINDINGS: RIGHT jugular Port-A-Cath with tip projecting over SVC. Stable heart size and pulmonary vascularity. Persistent visualization of abnormal LEFT perihilar opacity which by prior CT appears represent radiation fibrosis. Interval significant decrease in size of RIGHT paratracheal soft tissue density, by a prior CT adenopathy versus tumor. Minimal fullness of RIGHT hilum unchanged. Underlying emphysematous and bronchitic changes with minimal RIGHT middle lobe scarring. Atherosclerotic calcification aorta. No definite acute infiltrate, pleural effusion or pneumothorax. Bones demineralized with chronic RIGHT rotator cuff tear. 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 Significant interval decrease in size of RIGHT paratracheal mass/adenopathy since 02/17/2016. Electronically Signed   By: Lavonia Dana M.D.   On: 03/26/2016 13:41   Dg Abd 2 Views  03/26/2016  CLINICAL DATA:  Stage IV lung cancer with increasing shortness of breath and abdomen pain since last night. EXAM: ABDOMEN - 2 VIEW COMPARISON:  CT abdomen pelvis February 12, 2016 FINDINGS: The bowel gas pattern is normal. Extensive bowel content is identified and colon. There is no small bowel obstruction. There is no evidence of free air. No radio-opaque calculi or other significant radiographic abnormality is seen. Degenerative joint changes of the spine are noted. Sclerotic changes/pagetoid changes are identified in the right hip. IMPRESSION: Constipation.  No bowel obstruction. Electronically Signed   By: Abelardo Diesel M.D.   On: 03/26/2016 13:41   I have personally reviewed and evaluated these images and lab results as part of my medical decision-making.    MDM   Final diagnoses:  Abdominal pain  Malignant neoplasm of  lung, unspecified laterality, unspecified part of lung ([place])    The patient's symptoms improved with treatment with pain medications. She was also given IV fluids, a dose of antinausea medications and a breathing treatment. Patient was given something to eat and was able to tolerate that. She feels better and would like to go  home. Patient understands that her cancer is end-stage and she is receiving palliative treatment. She would prefer to be at home if possible.   Dorie Rank, MD 03/26/16 [phone]

## 2016-03-27 ENCOUNTER — Ambulatory Visit (INDEPENDENT_AMBULATORY_CARE_PROVIDER_SITE_OTHER): Payer: Medicare Other | Admitting: Acute Care

## 2016-03-27 ENCOUNTER — Encounter: Payer: Self-pay | Admitting: Acute Care

## 2016-03-27 VITALS — BP 122/60 | HR 126 | Ht 65.5 in | Wt 115.0 lb

## 2016-03-27 DIAGNOSIS — G893 Neoplasm related pain (acute) (chronic): Secondary | ICD-10-CM | POA: Diagnosis not present

## 2016-03-27 DIAGNOSIS — C3411 Malignant neoplasm of upper lobe, right bronchus or lung: Secondary | ICD-10-CM | POA: Diagnosis not present

## 2016-03-27 DIAGNOSIS — Z9981 Dependence on supplemental oxygen: Secondary | ICD-10-CM | POA: Diagnosis not present

## 2016-03-27 DIAGNOSIS — J449 Chronic obstructive pulmonary disease, unspecified: Secondary | ICD-10-CM

## 2016-03-27 DIAGNOSIS — C3490 Malignant neoplasm of unspecified part of unspecified bronchus or lung: Secondary | ICD-10-CM | POA: Diagnosis not present

## 2016-03-27 DIAGNOSIS — J441 Chronic obstructive pulmonary disease with (acute) exacerbation: Secondary | ICD-10-CM | POA: Diagnosis not present

## 2016-03-27 DIAGNOSIS — E0965 Drug or chemical induced diabetes mellitus with hyperglycemia: Secondary | ICD-10-CM | POA: Diagnosis not present

## 2016-03-27 DIAGNOSIS — J9621 Acute and chronic respiratory failure with hypoxia: Secondary | ICD-10-CM | POA: Diagnosis not present

## 2016-03-27 MED ORDER — LEVALBUTEROL HCL 0.63 MG/3ML IN NEBU
0.6300 mg | INHALATION_SOLUTION | Freq: Once | RESPIRATORY_TRACT | Status: AC
  Administered 2016-03-27: 0.63 mg via RESPIRATORY_TRACT

## 2016-03-27 NOTE — Assessment & Plan Note (Signed)
Currently receiving palliative chemo for her end stage lung cancer She understands her cancer is end stage She is being followed by [facility]osp[facility] Plan: Support respiratory needs through treatment. Request oxygen as palliative treatment for her end stage lung cancer. Please contact office for sooner follow up if symptoms do not improve or worsen or seek emergency care

## 2016-03-27 NOTE — Progress Notes (Signed)
History of Present Illness Destiny Harrison is a 75 y.o. female with COPD on home oxygen at [address] [place] 4 A metastatic small cell lung cancer which she is currently receiving palliative chemotherapy.She is followed by Dr. Chase Caller.   6/7/2017Oxygen Qualifiavtion OV: Pt. Presents today for re-qualification of oxygen use.She states that her dyspnea related to her COPD has been at baseline. She states her breathing is a little shallow, since her last chemo treatment.She stastes she was in the ED yesterday for pain and shortness of breath after her chemo treatment on 03/21/16 . She was treated with IVF, antiemetic and pain medication. She was discharged home as she states that she prefers to be at home if at all possible.She is tachycardic today after walking, which she was also yesterday in the ED.Marland KitchenShe is using her nebulizer treatments every 12 hours at home, and is compliant with her COPD medications.She is using her oxygen at .Per her CXR done yesterday, there has been significant decrease in the size of the right paratracheal mass/ adenopathy since 01/2016. She understands her cancer is end stage, and that current treatment is palliative. She did not drop her oxygen saturations with walking, but as she is involved with [facility]  we will have them request oxygen therapy for palliative/ comfort measures which should qualify her through her insurance.  Tests CXR 03/26/16:  IMPRESSION: COPD changes with radiation fibrosis changes in LEFT upper lobe and RIGHT basilar scarring.  Significant interval decrease in size of RIGHT paratracheal mass/adenopathy since 02/17/2016.  Past medical hx Past Medical History  Diagnosis Date  . COPD (chronic obstructive pulmonary disease) ([place])   . Neutropenia, drug-induced ([place]) 05/05/2012  . Hyperlipidemia   . Rheumatoid arthritis(714.0)   . Asthma   . Hiatal hernia   . History of chemotherapy   . History of radiation therapy 03/06/2012    left hilum  . History of  radiation therapy 05/10/2013-05/31/2013    Left lung/ 33/75'@2'$ .25 per fraction x 15 fractions  . Radiation 11/15/13-11/26/13    Right hilum 30 Gy in 10 fractions  . Non-small cell lung cancer ([place]) dx'd 08/28/11    left lung  . Encounter for antineoplastic immunotherapy 05/22/2015  . DNR (do not resuscitate) 06/26/2015  . Primary cancer of right upper lobe of lung ([place]) 02/06/2012  . Dysphagia 12/07/2015     Past surgical hx, Family hx, Social hx all reviewed.  Current Outpatient Prescriptions on File Prior to Visit  Medication Sig  . albuterol (PROAIR HFA) 108 (90 BASE) MCG/ACT inhaler Inhale 2 puffs into the lungs every 6 (six) hours as needed for wheezing or shortness of breath.  Marland Kitchen albuterol (PROVENTIL) (2.5 MG/3ML) 0.083% nebulizer solution Take 2.5 mg by nebulization every 8 (eight) hours.  . benzonatate (TESSALON) 100 MG capsule Take 100 mg by mouth every 8 (eight) hours as needed for cough.   . budesonide (PULMICORT) 0.25 MG/2ML nebulizer solution Take 2 mLs (0.25 mg total) by nebulization 2 (two) times daily.  . calcium-vitamin D (OSCAL WITH D) 500-200 MG-UNIT per tablet Take 1 tablet by mouth daily.  Marland Kitchen diltiazem (CARDIZEM CD) 180 MG 24 hr capsule Take 180 mg by mouth daily.  . folic acid (FOLVITE) 053 MCG tablet Take 400 mcg by mouth daily.  Marland Kitchen guaiFENesin (MUCINEX) 600 MG 12 hr tablet Take 1 tablet (600 mg total) by mouth 2 (two) times daily.  Marland Kitchen HYDROcodone-acetaminophen (NORCO/VICODIN) 5-325 MG tablet Take 1 tablet by mouth every 4 (four) hours as needed for moderate pain.  Marland Kitchen  ipratropium (ATROVENT) 0.02 % nebulizer solution Take 0.5 mg by nebulization every 8 (eight) hours.  . magnesium oxide (MAG-OX) 400 (241.3 Mg) MG tablet Take 1 tablet (400 mg total) by mouth 2 (two) times daily.  Marland Kitchen morphine (MS CONTIN) 15 MG 12 hr tablet Take 1 tablet (15 mg total) by mouth every 12 (twelve) hours.  . ondansetron (ZOFRAN ODT) 8 MG disintegrating tablet Take 1 tablet (8 mg total) by mouth every 8 (eight)  hours as needed for nausea or vomiting.  . ondansetron (ZOFRAN) 8 MG tablet Take 8 mg by mouth every 8 (eight) hours as needed. Reported on 02/01/2016  . OXYGEN Inhale 2 L into the lungs continuous.  . pantoprazole (PROTONIX) 40 MG tablet Take 40 mg by mouth daily as needed (acid reflux).   . polyethylene glycol (MIRALAX / GLYCOLAX) packet MX AND DRK 1 PACKET PO QD MIXED WITH 8 OUNCES OF FLUID  . predniSONE (DELTASONE) 5 MG tablet Take 5 mg by mouth daily.  . prochlorperazine (COMPAZINE) 10 MG tablet Take 1 tablet (10 mg total) by mouth every 6 (six) hours as needed for nausea or vomiting.  . simvastatin (ZOCOR) 5 MG tablet Take 10 mg by mouth at bedtime.    No current facility-administered medications on file prior to visit.     Allergies  Allergen Reactions  . Shellfish Allergy Swelling    Review Of Systems:  Constitutional:   +  weight loss, no night sweats,  Fevers, chills, fatigue, or  lassitude.  HEENT:   No headaches,  Difficulty swallowing,  Tooth/dental problems, or  Sore throat,                No sneezing, itching, ear ache, nasal congestion, post nasal drip,   CV:  No chest pain,  Orthopnea, PND, swelling in lower extremities, anasarca, dizziness, palpitations, syncope.   GI  No heartburn, indigestion, abdominal pain, nausea, vomiting, diarrhea, change in bowel habits, loss of appetite, bloody stools.   Resp: + shortness of breath with exertion or at rest.  No excess mucus, + productive cough,  No non-productive cough,  No coughing up of blood.  No change in color of mucus.  + wheezing.  No chest wall deformity  Skin: no rash or lesions.  GU: no dysuria, change in color of urine, no urgency or frequency.  No flank pain, no hematuria   MS:  No joint pain or swelling.  No decreased range of motion.  No back pain.  Psych:  No change in mood or affect. No depression or anxiety.  No memory loss.   Vital Signs BP 122/60 mmHg  Pulse 126  Ht 5' 5.5" (1.664 m)  Wt 115 lb  (52.164 kg)  BMI 18.84 kg/m2  SpO2 95%   Physical Exam:  General- No distress,  A&Ox3, frail, very thin female. ENT: No sinus tenderness, TM clear, pale nasal mucosa, no oral exudate,no post nasal drip, no LAN Cardiac: S1, S2, regular rate and rhythm, no murmur Chest: + wheeze/ no rales/ dullness; no accessory muscle use, no nasal flaring, no sternal retractions Abd.: Soft Non-tender Ext: No clubbing cyanosis, edema Neuro:  normal strength Skin: No rashes, warm and dry Psych: depressed   Assessment/Plan  COPD, moderate (HCC) COPD/ Stage 4 Lung Cancer with mets. Currently getting palliative chemotherapy. Here to re-qualify for oxygen per the insurance company. Plan: We will walk you today in the office to re-qualify you for your home oxygen. We will request that your oxygen be renewed  based on palliative care for your lung cancer. We will do a nebulizer treatment before you leave the office today. Continue your Pulmicort twice daily. Continue your Proventil every 8 hours. Continue your prednisone 5 mg daily. Please drink water and fluids and rest. Follow up with Dr. Chase Caller as scheduled in September as is scheduled. Please come in sooner if you need anything, or if we can help in any way.   Malignant neoplasm of lung [facility] [place]) Currently receiving palliative chemo for her end stage lung cancer She understands her cancer is end stage She is being followed by [facility]osp[facility] Plan: Support respiratory needs through treatment. Request oxygen as palliative treatment for her end stage lung cancer. Please contact office for sooner follow up if symptoms do not improve or worsen or seek emergency care      Magdalen Spatz, NP 03/27/2016  10:55 AM

## 2016-03-27 NOTE — Patient Instructions (Addendum)
It is nice to meet you today. We will walk you today in the office to re-qualify you for your home oxygen. We will request that your oxygen be renewed based on palliative care for your lung cancer. We will do a nebulizer treatment before you leave the office today. Continue your Pulmicort twice daily. Continue your Proventil every 8 hours. Continue your prednisone 5 mg daily. Please drink water and fluids and rest. Follow up with Dr. Chase Caller as scheduled in September as is scheduled. Please come in sooner if you need anything, or if we can help in any way.

## 2016-03-27 NOTE — Assessment & Plan Note (Signed)
COPD/ Stage 4 Lung Cancer with mets. Currently getting palliative chemotherapy. Here to re-qualify for oxygen per the insurance company. Plan: We will walk you today in the office to re-qualify you for your home oxygen. We will request that your oxygen be renewed based on palliative care for your lung cancer. We will do a nebulizer treatment before you leave the office today. Continue your Pulmicort twice daily. Continue your Proventil every 8 hours. Continue your prednisone 5 mg daily. Please drink water and fluids and rest. Follow up with Dr. Chase Caller as scheduled in September as is scheduled. Please come in sooner if you need anything, or if we can help in any way.

## 2016-03-29 ENCOUNTER — Other Ambulatory Visit: Payer: Self-pay | Admitting: Oncology

## 2016-03-29 ENCOUNTER — Telehealth: Payer: Self-pay | Admitting: Medical Oncology

## 2016-03-29 NOTE — Telephone Encounter (Signed)
FYI-States pt having problems with regurgitation- The provider is prescribing reglan 10 mg ac and hs and refill her zofran odt

## 2016-04-02 NOTE — Telephone Encounter (Signed)
Destiny Harrison pt.  Routed to [place] [facility].

## 2016-04-05 ENCOUNTER — Telehealth: Payer: Self-pay | Admitting: Internal Medicine

## 2016-04-05 NOTE — Telephone Encounter (Signed)
Will await fax.

## 2016-04-05 NOTE — Telephone Encounter (Signed)
Russel from [facility] will be faxing order for signature for oxygen order

## 2016-04-08 ENCOUNTER — Other Ambulatory Visit: Payer: Self-pay | Admitting: Medical Oncology

## 2016-04-08 DIAGNOSIS — C3411 Malignant neoplasm of upper lobe, right bronchus or lung: Secondary | ICD-10-CM | POA: Diagnosis not present

## 2016-04-08 DIAGNOSIS — C3490 Malignant neoplasm of unspecified part of unspecified bronchus or lung: Secondary | ICD-10-CM

## 2016-04-08 DIAGNOSIS — J449 Chronic obstructive pulmonary disease, unspecified: Secondary | ICD-10-CM | POA: Diagnosis not present

## 2016-04-08 DIAGNOSIS — Z9981 Dependence on supplemental oxygen: Secondary | ICD-10-CM | POA: Diagnosis not present

## 2016-04-08 DIAGNOSIS — J9621 Acute and chronic respiratory failure with hypoxia: Secondary | ICD-10-CM | POA: Diagnosis not present

## 2016-04-08 DIAGNOSIS — E0965 Drug or chemical induced diabetes mellitus with hyperglycemia: Secondary | ICD-10-CM | POA: Diagnosis not present

## 2016-04-08 DIAGNOSIS — G893 Neoplasm related pain (acute) (chronic): Secondary | ICD-10-CM | POA: Diagnosis not present

## 2016-04-08 MED ORDER — HYDROCODONE-ACETAMINOPHEN 5-325 MG PO TABS: 1.0000 | ORAL_TABLET | ORAL | Status: DC | PRN

## 2016-04-08 NOTE — Telephone Encounter (Signed)
ATC Joann with [facility], was on hold for ten minutes.  Wcb.  I see no fax on this pt in SG or MR's folders-need to speak with [facility] for clarification.

## 2016-04-09 ENCOUNTER — Other Ambulatory Visit: Payer: Self-pay | Admitting: Medical Oncology

## 2016-04-09 ENCOUNTER — Telehealth: Payer: Self-pay | Admitting: Medical Oncology

## 2016-04-09 DIAGNOSIS — C3411 Malignant neoplasm of upper lobe, right bronchus or lung: Secondary | ICD-10-CM

## 2016-04-09 MED ORDER — MORPHINE SULFATE ER 15 MG PO TBCR
15.0000 mg | EXTENDED_RELEASE_TABLET | Freq: Two times a day (BID) | ORAL | Status: DC
Start: 2016-04-09 — End: 2016-04-22

## 2016-04-09 NOTE — Telephone Encounter (Signed)
refill cancelled for MS contin

## 2016-04-09 NOTE — Progress Notes (Signed)
I called pt about MS contin- her last refill was 03/19/16 so she does not need refill . MS contin Rx shredded.

## 2016-04-11 ENCOUNTER — Encounter: Payer: Self-pay | Admitting: Internal Medicine

## 2016-04-11 ENCOUNTER — Other Ambulatory Visit (HOSPITAL_BASED_OUTPATIENT_CLINIC_OR_DEPARTMENT_OTHER): Payer: Medicare Other

## 2016-04-11 ENCOUNTER — Ambulatory Visit (HOSPITAL_BASED_OUTPATIENT_CLINIC_OR_DEPARTMENT_OTHER): Payer: Medicare Other

## 2016-04-11 ENCOUNTER — Ambulatory Visit (HOSPITAL_BASED_OUTPATIENT_CLINIC_OR_DEPARTMENT_OTHER): Payer: Medicare Other | Admitting: Internal Medicine

## 2016-04-11 VITALS — BP 149/67 | HR 110 | Temp 98.4°F | Resp 20 | Ht 65.5 in | Wt 122.5 lb

## 2016-04-11 DIAGNOSIS — R05 Cough: Secondary | ICD-10-CM

## 2016-04-11 DIAGNOSIS — C3491 Malignant neoplasm of unspecified part of right bronchus or lung: Secondary | ICD-10-CM

## 2016-04-11 DIAGNOSIS — C3411 Malignant neoplasm of upper lobe, right bronchus or lung: Secondary | ICD-10-CM

## 2016-04-11 DIAGNOSIS — R52 Pain, unspecified: Secondary | ICD-10-CM | POA: Diagnosis not present

## 2016-04-11 DIAGNOSIS — R0602 Shortness of breath: Secondary | ICD-10-CM | POA: Diagnosis not present

## 2016-04-11 DIAGNOSIS — Z5112 Encounter for antineoplastic immunotherapy: Secondary | ICD-10-CM

## 2016-04-11 DIAGNOSIS — Z5111 Encounter for antineoplastic chemotherapy: Secondary | ICD-10-CM | POA: Insufficient documentation

## 2016-04-11 LAB — CBC WITH DIFFERENTIAL/PLATELET
BASO%: 0.3 % (ref 0.0–2.0)
BASOS ABS: 0 10*3/uL (ref 0.0–0.1)
EOS ABS: 0 10*3/uL (ref 0.0–0.5)
EOS%: 0 % (ref 0.0–7.0)
HCT: 28.6 % — ABNORMAL LOW (ref 34.8–46.6)
HEMOGLOBIN: 9.2 g/dL — AB (ref 11.6–15.9)
LYMPH%: 4.8 % — ABNORMAL LOW (ref 14.0–49.7)
MCH: 27.1 pg (ref 25.1–34.0)
MCHC: 32.1 g/dL (ref 31.5–36.0)
MCV: 84.3 fL (ref 79.5–101.0)
MONO#: 0.2 10*3/uL (ref 0.1–0.9)
MONO%: 2 % (ref 0.0–14.0)
NEUT%: 92.9 % — ABNORMAL HIGH (ref 38.4–76.8)
NEUTROS ABS: 10.7 10*3/uL — AB (ref 1.5–6.5)
Platelets: 463 10*3/uL — ABNORMAL HIGH (ref 145–400)
RBC: 3.4 10*6/uL — AB (ref 3.70–5.45)
RDW: 20.2 % — AB (ref 11.2–14.5)
WBC: 11.5 10*3/uL — ABNORMAL HIGH (ref 3.9–10.3)
lymph#: 0.5 10*3/uL — ABNORMAL LOW (ref 0.9–3.3)

## 2016-04-11 LAB — COMPREHENSIVE METABOLIC PANEL
ALBUMIN: 2.9 g/dL — AB (ref 3.5–5.0)
ALK PHOS: 84 U/L (ref 40–150)
ALT: 10 U/L (ref 0–55)
AST: 10 U/L (ref 5–34)
Anion Gap: 11 mEq/L (ref 3–11)
BILIRUBIN TOTAL: 0.34 mg/dL (ref 0.20–1.20)
BUN: 19.3 mg/dL (ref 7.0–26.0)
CO2: 27 meq/L (ref 22–29)
CREATININE: 0.8 mg/dL (ref 0.6–1.1)
Calcium: 9.5 mg/dL (ref 8.4–10.4)
Chloride: 101 mEq/L (ref 98–109)
EGFR: 81 mL/min/{1.73_m2} — ABNORMAL LOW (ref >90)
GLUCOSE: 185 mg/dL — AB (ref 70–140)
Potassium: 4.9 mEq/L (ref 3.5–5.1)
SODIUM: 139 meq/L (ref 136–145)
TOTAL PROTEIN: 6.8 g/dL (ref 6.4–8.3)

## 2016-04-11 LAB — UA PROTEIN, DIPSTICK - CHCC: Protein, ur: <30 mg/dL

## 2016-04-11 MED ORDER — ACETAMINOPHEN 325 MG PO TABS
ORAL_TABLET | ORAL | Status: AC
  Filled 2016-04-11: qty 1

## 2016-04-11 MED ORDER — SODIUM CHLORIDE 0.9 % IV SOLN
10.0000 mg/kg | Freq: Once | INTRAVENOUS | Status: AC
  Administered 2016-04-11: 600 mg via INTRAVENOUS
  Filled 2016-04-11: qty 50

## 2016-04-11 MED ORDER — SODIUM CHLORIDE 0.9 % IV SOLN
60.0000 mg/m2 | Freq: Once | INTRAVENOUS | Status: AC
  Administered 2016-04-11: 100 mg via INTRAVENOUS
  Filled 2016-04-11: qty 10

## 2016-04-11 MED ORDER — HEPARIN SOD (PORK) LOCK FLUSH 100 UNIT/ML IV SOLN
500.0000 [IU] | Freq: Once | INTRAVENOUS | Status: AC | PRN
  Administered 2016-04-11: 500 [IU]
  Filled 2016-04-11: qty 5

## 2016-04-11 MED ORDER — DIPHENHYDRAMINE HCL 50 MG/ML IJ SOLN
50.0000 mg | Freq: Once | INTRAMUSCULAR | Status: AC
  Administered 2016-04-11: 50 mg via INTRAVENOUS

## 2016-04-11 MED ORDER — SODIUM CHLORIDE 0.9 % IV SOLN
10.0000 mg | Freq: Once | INTRAVENOUS | Status: AC
  Administered 2016-04-11: 10 mg via INTRAVENOUS
  Filled 2016-04-11: qty 1

## 2016-04-11 MED ORDER — ACETAMINOPHEN 325 MG PO TABS
ORAL_TABLET | ORAL | Status: AC
Start: 2016-04-11 — End: 2016-04-11
  Filled 2016-04-11: qty 2

## 2016-04-11 MED ORDER — SODIUM CHLORIDE 0.9% FLUSH
10.0000 mL | INTRAVENOUS | Status: DC | PRN
  Administered 2016-04-11: 10 mL
  Filled 2016-04-11: qty 10

## 2016-04-11 MED ORDER — SODIUM CHLORIDE 0.9 % IV SOLN
Freq: Once | INTRAVENOUS | Status: AC
  Administered 2016-04-11: 11:00:00 via INTRAVENOUS

## 2016-04-11 MED ORDER — DIPHENHYDRAMINE HCL 50 MG/ML IJ SOLN
INTRAMUSCULAR | Status: AC
  Filled 2016-04-11: qty 1

## 2016-04-11 MED ORDER — ACETAMINOPHEN 325 MG PO TABS
650.0000 mg | ORAL_TABLET | Freq: Once | ORAL | Status: AC
  Administered 2016-04-11: 650 mg via ORAL

## 2016-04-11 NOTE — Patient Instructions (Signed)
[facility] Discharge Instructions for Patients Receiving Chemotherapy  Today you received the following chemotherapy agents Cyramza/Docetaxel.  To help prevent nausea and vomiting after your treatment, we encourage you to take your nausea medication as directed.   If you develop nausea and vomiting that is not controlled by your nausea medication, call the clinic.   BELOW ARE SYMPTOMS THAT SHOULD BE REPORTED IMMEDIATELY:  *FEVER GREATER THAN 100.5 F  *CHILLS WITH OR WITHOUT FEVER  NAUSEA AND VOMITING THAT IS NOT CONTROLLED WITH YOUR NAUSEA MEDICATION  *UNUSUAL SHORTNESS OF BREATH  *UNUSUAL BRUISING OR BLEEDING  TENDERNESS IN MOUTH AND THROAT WITH OR WITHOUT PRESENCE OF ULCERS  *URINARY PROBLEMS  *BOWEL PROBLEMS  UNUSUAL RASH Items with * indicate a potential emergency and should be followed up as soon as possible.  Feel free to call the clinic you have any questions or concerns. The clinic phone number is [phone].  Please show the [facility] at check-in to the Emergency Department and triage nurse.

## 2016-04-11 NOTE — Progress Notes (Signed)
[place] [phone]   Fax:([phone]) [place], [place], [address] [place] [address]  DIAGNOSIS: Metastatic non-small cell lung cancer, squamous cell carcinoma diagnosed in March of 2013.   PRIOR THERAPY:  1. Status post palliative radiotherapy to the left lung mass under the care of Dr. Pablo Ledger completed on 03/06/2012.  2. Systemic chemotherapy with carboplatin for AUC of 6 on day 1 and Abraxane 100 mg/M2 on days 1, 8 and 15 every 3 weeks. Status post 2 cycles. From cycle 3 forward AUC will be decreased to 4.5 given on day 1 and the Abraxane will be decreased to 90 mg per meter squared on days 1, 8 and 15 every 3 weeks, Status post a total of 3 cycles.  3. Systemic chemotherapy with carboplatin for AUC of 5 on day 1 and gemcitabine 1000 mg/m2 given on day 1 and day 8 every 3 weeks,status post 1 cycle. Due to significant neutropenia she will be dosed reduced beginning cycle 2 forward to carboplatin at an AUC of 4 given on day 1 and gemcitabine at 800 mg per meter squared given on days 1 and 8 every 3 weeks. Status post 6 cycles.  4. Status post palliative radiotherapy to the right hilum under the care of Dr. Pablo Ledger completed on 11/26/2013. 5. Immunotherapy with Nivolumab 240 MG every 2 weeks. First dose expected 03/07/2015. Status post 20 cycles, last dose was given 01/18/2016 discontinued secondary to disease progression.  CURRENT THERAPY: Systemic chemotherapy with docetaxel 75 MG/M2 and Cyramza 10 mg/KG every 3 weeks. First dose 02/08/2016. Status post 3 cycles.  CHEMOTHERAPY INTENT: Palliative  CURRENT # OF CHEMOTHERAPY CYCLES: 4  CURRENT ANTIEMETICS: Compazine  CURRENT SMOKING STATUS: Former smoker  ORAL CHEMOTHERAPY AND CONSENT: None  CURRENT BISPHOSPHONATES USE: None  PAIN MANAGEMENT: 5/10 right shoulder currently on Norco  NARCOTICS INDUCED CONSTIPATION: None  LIVING WILL AND CODE  STATUS: No CODE BLUE   INTERVAL HISTORY: Destiny Harrison 75 y.o. female returns to the clinic today for followup visit accompanied by her husband. The patient is feeling fine today except for the baseline shortness of breath and she is currently on home oxygen. She is tolerating her current treatment with docetaxel and Cyramza fairly well with no significant complaints. No significant dysphagia. She denied having any significant chest pain but continues to have cough with no hemoptysis. She has no significant weight loss or night sweats. The patient denied having any significant fever or chills, nausea or vomiting. She is expected to have repeat CT scan of the chest before this visit but unfortunately her insurance issues it is scheduled to be done next week.  MEDICAL HISTORY: Past Medical History  Diagnosis Date  . COPD (chronic obstructive pulmonary disease) ([place])   . Neutropenia, drug-induced ([place]) 05/05/2012  . Hyperlipidemia   . Rheumatoid arthritis(714.0)   . Asthma   . Hiatal hernia   . History of chemotherapy   . History of radiation therapy 03/06/2012    left hilum  . History of radiation therapy 05/10/2013-05/31/2013    Left lung/ 33/75'@2'$ .25 per fraction x 15 fractions  . Radiation 11/15/13-11/26/13    Right hilum 30 Gy in 10 fractions  . Non-small cell lung cancer ([place]) dx'd 08/28/11    left lung  . Encounter for antineoplastic immunotherapy 05/22/2015  . DNR (do not resuscitate) 06/26/2015  . Primary cancer of right upper lobe of lung ([facility]) 02/06/2012  . Dysphagia 12/07/2015  ALLERGIES:  is allergic to shellfish allergy.  MEDICATIONS:  Current Outpatient Prescriptions  Medication Sig Dispense Refill  . albuterol (PROAIR HFA) 108 (90 BASE) MCG/ACT inhaler Inhale 2 puffs into the lungs every 6 (six) hours as needed for wheezing or shortness of breath. 1 Inhaler 5  . albuterol (PROVENTIL) (2.5 MG/3ML) 0.083% nebulizer solution Take 2.5 mg by nebulization every 8 (eight) hours.    .  benzonatate (TESSALON) 100 MG capsule Take 100 mg by mouth every 8 (eight) hours as needed for cough.   1  . budesonide (PULMICORT) 0.25 MG/2ML nebulizer solution Take 2 mLs (0.25 mg total) by nebulization 2 (two) times daily. 60 mL 12  . calcium-vitamin D (OSCAL WITH D) 500-200 MG-UNIT per tablet Take 1 tablet by mouth daily.    . folic acid (FOLVITE) 025 MCG tablet Take 400 mcg by mouth daily.    Marland Kitchen guaiFENesin (MUCINEX) 600 MG 12 hr tablet Take 1 tablet (600 mg total) by mouth 2 (two) times daily. 30 tablet 1  . HYDROcodone-acetaminophen (NORCO/VICODIN) 5-325 MG tablet Take 1 tablet by mouth every 4 (four) hours as needed for moderate pain. 60 tablet 0  . ipratropium (ATROVENT) 0.02 % nebulizer solution Take 0.5 mg by nebulization every 8 (eight) hours.    . magnesium oxide (MAG-OX) 400 (241.3 Mg) MG tablet Take 1 tablet (400 mg total) by mouth 2 (two) times daily. 60 tablet 0  . ondansetron (ZOFRAN) 8 MG tablet Take 8 mg by mouth every 8 (eight) hours as needed. Reported on 02/01/2016    . ondansetron (ZOFRAN-ODT) 8 MG disintegrating tablet DISSOLVE 1 TABLET BY MOUTH EVERY 8 HOURS AS NEEDED FOR NAUSEA AND VOMITING 20 tablet 0  . OXYGEN Inhale 2 L into the lungs continuous.    . polyethylene glycol (MIRALAX / GLYCOLAX) packet MX AND DRK 1 PACKET PO QD MIXED WITH 8 OUNCES OF FLUID  0  . predniSONE (DELTASONE) 5 MG tablet Take 5 mg by mouth daily.    . simvastatin (ZOCOR) 5 MG tablet Take 10 mg by mouth at bedtime.     Marland Kitchen diltiazem (CARDIZEM CD) 180 MG 24 hr capsule Take 180 mg by mouth daily.    . pantoprazole (PROTONIX) 40 MG tablet Take 40 mg by mouth daily as needed (acid reflux).     . prochlorperazine (COMPAZINE) 10 MG tablet Take 1 tablet (10 mg total) by mouth every 6 (six) hours as needed for nausea or vomiting. (Patient not taking: Reported on 04/11/2016) 30 tablet 0   No current facility-administered medications for this visit.    SURGICAL HISTORY:  Past Surgical History  Procedure  Laterality Date  . Appendex  1962  . Video bronchoscopy  01/28/2012    Procedure: VIDEO BRONCHOSCOPY WITHOUT FLUORO;  Surgeon: Brand Males, MD;  Location[facility]COPY;  Service: Endoscopy;  Laterality: Bilateral;  . Surgery on right wrist      REVIEW OF SYSTEMS:  A comprehensive review of systems was negative except for: Constitutional: positive for fatigue Respiratory: positive for dyspnea on exertion   PHYSICAL EXAMINATION: General appearance: alert, cooperative and no distress Head: Normocephalic, without obvious abnormality, atraumatic Neck: no adenopathy, no JVD, supple, symmetrical, trachea midline and thyroid not enlarged, symmetric, no tenderness/mass/nodules Lymph nodes: Cervical, supraclavicular, and axillary nodes normal. Resp: clear to auscultation bilaterally Back: symmetric, no curvature. ROM normal. No CVA tenderness. Cardio: regular rate and rhythm, S1, S2 normal, no murmur, click, rub or gallop GI: soft, non-tender; bowel sounds normal; no masses,  no  organomegaly Extremities: extremities normal, atraumatic, no cyanosis or edema Neurologic: Alert and oriented X 3, normal strength and tone. Normal symmetric reflexes. Normal coordination and gait  ECOG PERFORMANCE STATUS: 1 - Symptomatic but completely ambulatory  Blood pressure 149/67, pulse 110, temperature 98.4 F (36.9 C), temperature source Oral, resp. rate 20, height 5' 5.5" (1.664 m), weight 122 lb 8 oz (55.566 kg), SpO2 97 %.  LABORATORY DATA: Lab Results  Component Value Date   WBC 11.5* 04/11/2016   HGB 9.2* 04/11/2016   HCT 28.6* 04/11/2016   MCV 84.3 04/11/2016   PLT 463* 04/11/2016      Chemistry      Component Value Date/Time   NA 139 04/11/2016 1017   NA 136 03/26/2016 1242   K 4.9 04/11/2016 1017   K 3.4* 03/26/2016 1242   CL 100* 03/26/2016 1242   CL 101 01/12/2013 0810   CO2 27 04/11/2016 1017   CO2 28 03/26/2016 1242   BUN 19.3 04/11/2016 1017   BUN 18 03/26/2016 1242   CREATININE  0.8 04/11/2016 1017   CREATININE 0.73 03/26/2016 1242      Component Value Date/Time   CALCIUM 9.5 04/11/2016 1017   CALCIUM 8.9 03/26/2016 1242   ALKPHOS 84 04/11/2016 1017   ALKPHOS 88 03/26/2016 1242   AST 10 04/11/2016 1017   AST 14* 03/26/2016 1242   ALT 10 04/11/2016 1017   ALT 13* 03/26/2016 1242   BILITOT 0.34 04/11/2016 1017   BILITOT 1.1 03/26/2016 1242       RADIOGRAPHIC STUDIES: Dg Chest 2 View  03/26/2016  CLINICAL DATA:  Stage IV non-small-cell lung cancer, increasing and currently significant shortness of breath, history COPD, asthma, prior radiation therapy, rheumatoid arthritis EXAM: CHEST  2 VIEW COMPARISON:  02/17/2016 ; correlation CT chest 02/12/2016 FINDINGS: RIGHT jugular Port-A-Cath with tip projecting over SVC. Stable heart size and pulmonary vascularity. Persistent visualization of abnormal LEFT perihilar opacity which by prior CT appears represent radiation fibrosis. Interval significant decrease in size of RIGHT paratracheal soft tissue density, by a prior CT adenopathy versus tumor. Minimal fullness of RIGHT hilum unchanged. Underlying emphysematous and bronchitic changes with minimal RIGHT middle lobe scarring. Atherosclerotic calcification aorta. No definite acute infiltrate, pleural effusion or pneumothorax. Bones demineralized with chronic RIGHT rotator cuff tear. 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 Significant interval decrease in size of RIGHT paratracheal mass/adenopathy since 02/17/2016. Electronically Signed   By: Lavonia Dana M.D.   On: 03/26/2016 13:41   Dg Abd 2 Views  03/26/2016  CLINICAL DATA:  Stage IV lung cancer with increasing shortness of breath and abdomen pain since last night. EXAM: ABDOMEN - 2 VIEW COMPARISON:  CT abdomen pelvis February 12, 2016 FINDINGS: The bowel gas pattern is normal. Extensive bowel content is identified and colon. There is no small bowel obstruction. There is  no evidence of free air. No radio-opaque calculi or other significant radiographic abnormality is seen. Degenerative joint changes of the spine are noted. Sclerotic changes/pagetoid changes are identified in the right hip. IMPRESSION: Constipation.  No bowel obstruction. Electronically Signed   By: Abelardo Diesel M.D.   On: 03/26/2016 13:41   ASSESSMENT AND PLAN: This is a very pleasant 75 years old African American female with metastatic non-small cell lung cancer, squamous cell carcinoma status post several chemotherapy regimen and and completed a course of palliative radiotherapy to the right hilum in February 2015.  She completed a course of treatment with  immunotherapy with Nivolumab status post 20 cycles. This was discontinued today secondary to disease progression with further enlargement of the large right paratracheal lymph node. I discussed the scan results and showed the images to the patient and her son. I recommended for her to discontinue her current treatment with Nivolumab at this point. She is currently on systemic chemotherapy with docetaxel 75 MG/M2 and Cyramza 10 MG/KG every 3 weeks with Neulasta support. Status post 3 cycles and tolerating her treatment well. The recent chest x-ray showed significant improvement of the right paratracheal adenopathy. The patient has a scheduled to have repeat CT scan of the chest, abdomen and pelvis next week. We will call her with the results after the scan. I recommended for the patient to proceed with cycle #4 today as a scheduled. She would come back for follow-up visit in 3 weeks for evaluation before starting cycle #5.  For pain management, she will continue on MS Contin in addition to Vicodin. For the cough, she will continue on Tussinox. She was advised to call immediately if she has any concerning symptoms in the interval.  The patient voices understanding of current disease status and treatment options and is in agreement with the current  care plan.  All questions were answered. The patient knows to call the clinic with any problems, questions or concerns. We can certainly see the patient much sooner if necessary.  Disclaimer: This note was dictated with voice recognition software. Similar sounding words can inadvertently be transcribed and may not be corrected upon review.

## 2016-04-13 ENCOUNTER — Ambulatory Visit (HOSPITAL_BASED_OUTPATIENT_CLINIC_OR_DEPARTMENT_OTHER): Payer: Medicare Other

## 2016-04-13 VITALS — BP 142/66 | HR 104 | Temp 98.3°F | Resp 20

## 2016-04-13 DIAGNOSIS — Z5189 Encounter for other specified aftercare: Secondary | ICD-10-CM | POA: Diagnosis not present

## 2016-04-13 DIAGNOSIS — C3411 Malignant neoplasm of upper lobe, right bronchus or lung: Secondary | ICD-10-CM

## 2016-04-13 DIAGNOSIS — C3491 Malignant neoplasm of unspecified part of right bronchus or lung: Secondary | ICD-10-CM | POA: Diagnosis present

## 2016-04-13 MED ORDER — PEGFILGRASTIM INJECTION 6 MG/0.6ML ~~LOC~~
6.0000 mg | PREFILLED_SYRINGE | Freq: Once | SUBCUTANEOUS | Status: AC
  Administered 2016-04-13: 6 mg via SUBCUTANEOUS

## 2016-04-16 ENCOUNTER — Encounter (HOSPITAL_COMMUNITY): Payer: Self-pay

## 2016-04-16 ENCOUNTER — Ambulatory Visit (HOSPITAL_COMMUNITY)
Admission: RE | Admit: 2016-04-16 | Discharge: 2016-04-16 | Disposition: A | Discharge status: Home / Self Care | Payer: Medicare Other | Source: Ambulatory Visit | Attending: Internal Medicine | Admitting: Internal Medicine

## 2016-04-16 DIAGNOSIS — M4854XA Collapsed vertebra, not elsewhere classified, thoracic region, initial encounter for fracture: Secondary | ICD-10-CM | POA: Insufficient documentation

## 2016-04-16 DIAGNOSIS — J701 Chronic and other pulmonary manifestations due to radiation: Secondary | ICD-10-CM | POA: Insufficient documentation

## 2016-04-16 DIAGNOSIS — C3412 Malignant neoplasm of upper lobe, left bronchus or lung: Secondary | ICD-10-CM | POA: Diagnosis not present

## 2016-04-16 DIAGNOSIS — R590 Localized enlarged lymph nodes: Secondary | ICD-10-CM | POA: Diagnosis not present

## 2016-04-16 DIAGNOSIS — J439 Emphysema, unspecified: Secondary | ICD-10-CM | POA: Diagnosis not present

## 2016-04-16 DIAGNOSIS — M88851 Osteitis deformans of right thigh: Secondary | ICD-10-CM | POA: Insufficient documentation

## 2016-04-16 DIAGNOSIS — I2510 Atherosclerotic heart disease of native coronary artery without angina pectoris: Secondary | ICD-10-CM | POA: Insufficient documentation

## 2016-04-16 DIAGNOSIS — I700 Atherosclerosis of aorta: Secondary | ICD-10-CM | POA: Insufficient documentation

## 2016-04-16 DIAGNOSIS — Z923 Personal history of irradiation: Secondary | ICD-10-CM | POA: Diagnosis not present

## 2016-04-16 DIAGNOSIS — C3411 Malignant neoplasm of upper lobe, right bronchus or lung: Secondary | ICD-10-CM | POA: Insufficient documentation

## 2016-04-16 MED ORDER — IOPAMIDOL (ISOVUE-300) INJECTION 61%
100.0000 mL | Freq: Once | INTRAVENOUS | Status: AC | PRN
  Administered 2016-04-16: 80 mL via INTRAVENOUS

## 2016-04-16 NOTE — Telephone Encounter (Signed)
If it is for MR he has his CMN folder in his office and I'm not sure if the CMN is in the folder or not

## 2016-04-16 NOTE — Telephone Encounter (Signed)
Destiny Harrison have you received CMN for this patient? Please advise.

## 2016-04-17 ENCOUNTER — Telehealth: Payer: Self-pay | Admitting: Medical Oncology

## 2016-04-17 ENCOUNTER — Other Ambulatory Visit: Payer: Self-pay | Admitting: Medical Oncology

## 2016-04-17 DIAGNOSIS — C3490 Malignant neoplasm of unspecified part of unspecified bronchus or lung: Secondary | ICD-10-CM

## 2016-04-17 NOTE — Telephone Encounter (Signed)
MR do you have this form? Thanks.

## 2016-04-17 NOTE — Telephone Encounter (Signed)
Just signed a bunch and gave it to [place]

## 2016-04-17 NOTE — Telephone Encounter (Signed)
"  Tell Destiny Harrison I had scan done". I told her to keep appt .

## 2016-04-17 NOTE — Telephone Encounter (Signed)
Destiny Harrison - did you get this form? Thanks!

## 2016-04-17 NOTE — Telephone Encounter (Signed)
Good response. Will continue chemo

## 2016-04-18 ENCOUNTER — Other Ambulatory Visit (HOSPITAL_BASED_OUTPATIENT_CLINIC_OR_DEPARTMENT_OTHER): Payer: Medicare Other

## 2016-04-18 DIAGNOSIS — C3411 Malignant neoplasm of upper lobe, right bronchus or lung: Secondary | ICD-10-CM | POA: Diagnosis present

## 2016-04-18 DIAGNOSIS — I10 Essential (primary) hypertension: Secondary | ICD-10-CM | POA: Diagnosis not present

## 2016-04-18 DIAGNOSIS — N390 Urinary tract infection, site not specified: Secondary | ICD-10-CM | POA: Diagnosis not present

## 2016-04-18 DIAGNOSIS — E785 Hyperlipidemia, unspecified: Secondary | ICD-10-CM | POA: Diagnosis not present

## 2016-04-18 DIAGNOSIS — Z1321 Encounter for screening for nutritional disorder: Secondary | ICD-10-CM | POA: Diagnosis not present

## 2016-04-18 DIAGNOSIS — Z131 Encounter for screening for diabetes mellitus: Secondary | ICD-10-CM | POA: Diagnosis not present

## 2016-04-18 DIAGNOSIS — E559 Vitamin D deficiency, unspecified: Secondary | ICD-10-CM | POA: Diagnosis not present

## 2016-04-18 DIAGNOSIS — Z79899 Other long term (current) drug therapy: Secondary | ICD-10-CM | POA: Diagnosis not present

## 2016-04-18 LAB — COMPREHENSIVE METABOLIC PANEL
ALT: 9 U/L (ref 0–55)
ANION GAP: 12 meq/L — AB (ref 3–11)
AST: 10 U/L (ref 5–34)
Albumin: 2.9 g/dL — ABNORMAL LOW (ref 3.5–5.0)
Alkaline Phosphatase: 109 U/L (ref 40–150)
BUN: 13.7 mg/dL (ref 7.0–26.0)
CHLORIDE: 96 meq/L — AB (ref 98–109)
CO2: 29 meq/L (ref 22–29)
CREATININE: 0.8 mg/dL (ref 0.6–1.1)
Calcium: 9.8 mg/dL (ref 8.4–10.4)
EGFR: 81 mL/min/{1.73_m2} — ABNORMAL LOW (ref >90)
Glucose: 174 mg/dl — ABNORMAL HIGH (ref 70–140)
POTASSIUM: 3.7 meq/L (ref 3.5–5.1)
Sodium: 137 mEq/L (ref 136–145)
Total Bilirubin: 0.64 mg/dL (ref 0.20–1.20)
Total Protein: 6.6 g/dL (ref 6.4–8.3)

## 2016-04-18 LAB — CBC WITH DIFFERENTIAL/PLATELET
BASO%: 0.3 % (ref 0.0–2.0)
Basophils Absolute: 0 10*3/uL (ref 0.0–0.1)
EOS%: 0.3 % (ref 0.0–7.0)
Eosinophils Absolute: 0 10*3/uL (ref 0.0–0.5)
HCT: 28.5 % — ABNORMAL LOW (ref 34.8–46.6)
HGB: 9.3 g/dL — ABNORMAL LOW (ref 11.6–15.9)
LYMPH%: 9.9 % — AB (ref 14.0–49.7)
MCH: 27.2 pg (ref 25.1–34.0)
MCHC: 32.6 g/dL (ref 31.5–36.0)
MCV: 83.6 fL (ref 79.5–101.0)
MONO#: 2.9 10*3/uL — AB (ref 0.1–0.9)
MONO%: 24.5 % — ABNORMAL HIGH (ref 0.0–14.0)
NEUT#: 7.8 10*3/uL — ABNORMAL HIGH (ref 1.5–6.5)
NEUT%: 65 % (ref 38.4–76.8)
PLATELETS: 374 10*3/uL (ref 145–400)
RBC: 3.42 10*6/uL — AB (ref 3.70–5.45)
RDW: 19 % — ABNORMAL HIGH (ref 11.2–14.5)
WBC: 12 10*3/uL — ABNORMAL HIGH (ref 3.9–10.3)
lymph#: 1.2 10*3/uL (ref 0.9–3.3)

## 2016-04-18 MED ORDER — HYDROCODONE-ACETAMINOPHEN 5-325 MG PO TABS: 1.0000 | ORAL_TABLET | ORAL | Status: DC | PRN

## 2016-04-18 NOTE — Telephone Encounter (Signed)
This CMN has been signed by MR and faxed

## 2016-04-18 NOTE — Addendum Note (Signed)
Addended by: Ardeen Garland on: 04/18/2016 09:14 AM   Modules accepted: Orders

## 2016-04-22 ENCOUNTER — Telehealth: Payer: Self-pay | Admitting: *Deleted

## 2016-04-22 DIAGNOSIS — E0965 Drug or chemical induced diabetes mellitus with hyperglycemia: Secondary | ICD-10-CM | POA: Diagnosis not present

## 2016-04-22 DIAGNOSIS — C3411 Malignant neoplasm of upper lobe, right bronchus or lung: Secondary | ICD-10-CM | POA: Diagnosis not present

## 2016-04-22 DIAGNOSIS — J449 Chronic obstructive pulmonary disease, unspecified: Secondary | ICD-10-CM | POA: Diagnosis not present

## 2016-04-22 DIAGNOSIS — J9621 Acute and chronic respiratory failure with hypoxia: Secondary | ICD-10-CM | POA: Diagnosis not present

## 2016-04-22 DIAGNOSIS — G893 Neoplasm related pain (acute) (chronic): Secondary | ICD-10-CM | POA: Diagnosis not present

## 2016-04-22 DIAGNOSIS — Z9981 Dependence on supplemental oxygen: Secondary | ICD-10-CM | POA: Diagnosis not present

## 2016-04-22 MED ORDER — MORPHINE SULFATE ER 15 MG PO TBCR
15.0000 mg | EXTENDED_RELEASE_TABLET | Freq: Two times a day (BID) | ORAL | Status: DC
Start: 2016-04-22 — End: 2016-05-23

## 2016-04-22 NOTE — Telephone Encounter (Signed)
Pt called for rx refill Morphine. Called pt 's pharmacy to verify last refill on this drug. S/W Linna Hoff at pt Pharmacy verified last refill 03/19/16. Pt will Pick up on Thursday 04/25/16.

## 2016-04-25 ENCOUNTER — Other Ambulatory Visit (HOSPITAL_BASED_OUTPATIENT_CLINIC_OR_DEPARTMENT_OTHER): Payer: Medicare Other

## 2016-04-25 DIAGNOSIS — C3411 Malignant neoplasm of upper lobe, right bronchus or lung: Secondary | ICD-10-CM | POA: Diagnosis present

## 2016-04-25 LAB — COMPREHENSIVE METABOLIC PANEL
ALK PHOS: 132 U/L (ref 40–150)
ALT: 10 U/L (ref 0–55)
ANION GAP: 10 meq/L (ref 3–11)
AST: 13 U/L (ref 5–34)
Albumin: 2.8 g/dL — ABNORMAL LOW (ref 3.5–5.0)
BILIRUBIN TOTAL: 0.3 mg/dL (ref 0.20–1.20)
BUN: 8.5 mg/dL (ref 7.0–26.0)
CALCIUM: 8.8 mg/dL (ref 8.4–10.4)
CO2: 29 mEq/L (ref 22–29)
Chloride: 103 mEq/L (ref 98–109)
Creatinine: 0.7 mg/dL (ref 0.6–1.1)
GLUCOSE: 87 mg/dL (ref 70–140)
Potassium: 3.5 mEq/L (ref 3.5–5.1)
SODIUM: 142 meq/L (ref 136–145)
TOTAL PROTEIN: 6.3 g/dL — AB (ref 6.4–8.3)

## 2016-04-25 LAB — CBC WITH DIFFERENTIAL/PLATELET
BASO%: 0.2 % (ref 0.0–2.0)
Basophils Absolute: 0 10*3/uL (ref 0.0–0.1)
EOS%: 0.2 % (ref 0.0–7.0)
Eosinophils Absolute: 0 10*3/uL (ref 0.0–0.5)
HCT: 27.5 % — ABNORMAL LOW (ref 34.8–46.6)
HGB: 9 g/dL — ABNORMAL LOW (ref 11.6–15.9)
LYMPH%: 11.6 % — AB (ref 14.0–49.7)
MCH: 27.2 pg (ref 25.1–34.0)
MCHC: 32.7 g/dL (ref 31.5–36.0)
MCV: 83.1 fL (ref 79.5–101.0)
MONO#: 1.1 10*3/uL — ABNORMAL HIGH (ref 0.1–0.9)
MONO%: 5.5 % (ref 0.0–14.0)
NEUT%: 82.5 % — AB (ref 38.4–76.8)
NEUTROS ABS: 16.2 10*3/uL — AB (ref 1.5–6.5)
Platelets: 348 10*3/uL (ref 145–400)
RBC: 3.31 10*6/uL — AB (ref 3.70–5.45)
RDW: 17.8 % — ABNORMAL HIGH (ref 11.2–14.5)
WBC: 19.6 10*3/uL — AB (ref 3.9–10.3)
lymph#: 2.3 10*3/uL (ref 0.9–3.3)

## 2016-04-30 DIAGNOSIS — E785 Hyperlipidemia, unspecified: Secondary | ICD-10-CM | POA: Diagnosis not present

## 2016-04-30 DIAGNOSIS — N183 Chronic kidney disease, stage 3 (moderate): Secondary | ICD-10-CM | POA: Diagnosis not present

## 2016-04-30 DIAGNOSIS — J449 Chronic obstructive pulmonary disease, unspecified: Secondary | ICD-10-CM | POA: Diagnosis not present

## 2016-04-30 DIAGNOSIS — I700 Atherosclerosis of aorta: Secondary | ICD-10-CM | POA: Diagnosis not present

## 2016-05-01 MED ORDER — ALBUTEROL SULFATE (2.5 MG/3ML) 0.083% IN NEBU
INHALATION_SOLUTION | RESPIRATORY_TRACT | Status: AC
  Filled 2016-05-01: qty 3

## 2016-05-02 ENCOUNTER — Telehealth: Payer: Self-pay | Admitting: Internal Medicine

## 2016-05-02 ENCOUNTER — Encounter: Payer: Self-pay | Admitting: Internal Medicine

## 2016-05-02 ENCOUNTER — Other Ambulatory Visit (HOSPITAL_BASED_OUTPATIENT_CLINIC_OR_DEPARTMENT_OTHER): Payer: Medicare Other

## 2016-05-02 ENCOUNTER — Ambulatory Visit (HOSPITAL_BASED_OUTPATIENT_CLINIC_OR_DEPARTMENT_OTHER): Payer: Medicare Other | Admitting: Internal Medicine

## 2016-05-02 ENCOUNTER — Ambulatory Visit (HOSPITAL_BASED_OUTPATIENT_CLINIC_OR_DEPARTMENT_OTHER): Payer: Medicare Other

## 2016-05-02 VITALS — BP 155/76 | HR 110 | Temp 98.7°F | Resp 18 | Ht 60.5 in | Wt 119.4 lb

## 2016-05-02 DIAGNOSIS — R05 Cough: Secondary | ICD-10-CM

## 2016-05-02 DIAGNOSIS — Z5111 Encounter for antineoplastic chemotherapy: Secondary | ICD-10-CM

## 2016-05-02 DIAGNOSIS — C3411 Malignant neoplasm of upper lobe, right bronchus or lung: Secondary | ICD-10-CM

## 2016-05-02 DIAGNOSIS — Z5112 Encounter for antineoplastic immunotherapy: Secondary | ICD-10-CM

## 2016-05-02 DIAGNOSIS — R52 Pain, unspecified: Secondary | ICD-10-CM | POA: Diagnosis not present

## 2016-05-02 LAB — CBC WITH DIFFERENTIAL/PLATELET
BASO%: 0.1 % (ref 0.0–2.0)
Basophils Absolute: 0 10*3/uL (ref 0.0–0.1)
EOS ABS: 0 10*3/uL (ref 0.0–0.5)
EOS%: 0 % (ref 0.0–7.0)
HCT: 30.4 % — ABNORMAL LOW (ref 34.8–46.6)
HEMOGLOBIN: 9.7 g/dL — AB (ref 11.6–15.9)
LYMPH#: 0.9 10*3/uL (ref 0.9–3.3)
LYMPH%: 6.7 % — ABNORMAL LOW (ref 14.0–49.7)
MCH: 26.7 pg (ref 25.1–34.0)
MCHC: 31.8 g/dL (ref 31.5–36.0)
MCV: 83.9 fL (ref 79.5–101.0)
MONO#: 0.3 10*3/uL (ref 0.1–0.9)
MONO%: 2.3 % (ref 0.0–14.0)
NEUT%: 90.9 % — ABNORMAL HIGH (ref 38.4–76.8)
NEUTROS ABS: 12.1 10*3/uL — AB (ref 1.5–6.5)
PLATELETS: 507 10*3/uL — AB (ref 145–400)
RBC: 3.63 10*6/uL — ABNORMAL LOW (ref 3.70–5.45)
RDW: 19.2 % — AB (ref 11.2–14.5)
WBC: 13.3 10*3/uL — AB (ref 3.9–10.3)

## 2016-05-02 LAB — COMPREHENSIVE METABOLIC PANEL
ALBUMIN: 3.2 g/dL — AB (ref 3.5–5.0)
ALK PHOS: 93 U/L (ref 40–150)
ALT: 10 U/L (ref 0–55)
ANION GAP: 12 meq/L — AB (ref 3–11)
AST: 12 U/L (ref 5–34)
BUN: 17.4 mg/dL (ref 7.0–26.0)
CO2: 28 mEq/L (ref 22–29)
Calcium: 9.4 mg/dL (ref 8.4–10.4)
Chloride: 100 mEq/L (ref 98–109)
Creatinine: 0.7 mg/dL (ref 0.6–1.1)
GLUCOSE: 124 mg/dL (ref 70–140)
Potassium: 4.4 mEq/L (ref 3.5–5.1)
SODIUM: 140 meq/L (ref 136–145)
TOTAL PROTEIN: 7.2 g/dL (ref 6.4–8.3)

## 2016-05-02 LAB — UA PROTEIN, DIPSTICK - CHCC: Protein, ur: <30 mg/dL

## 2016-05-02 MED ORDER — SODIUM CHLORIDE 0.9 % IV SOLN
60.0000 mg/m2 | Freq: Once | INTRAVENOUS | Status: AC
  Administered 2016-05-02: 100 mg via INTRAVENOUS
  Filled 2016-05-02: qty 10

## 2016-05-02 MED ORDER — HEPARIN SOD (PORK) LOCK FLUSH 100 UNIT/ML IV SOLN
500.0000 [IU] | Freq: Once | INTRAVENOUS | Status: AC | PRN
  Administered 2016-05-02: 500 [IU]
  Filled 2016-05-02: qty 5

## 2016-05-02 MED ORDER — SODIUM CHLORIDE 0.9 % IV SOLN
10.0000 mg | Freq: Once | INTRAVENOUS | Status: AC
  Administered 2016-05-02: 10 mg via INTRAVENOUS
  Filled 2016-05-02: qty 1

## 2016-05-02 MED ORDER — ACETAMINOPHEN 325 MG PO TABS
ORAL_TABLET | ORAL | Status: AC
  Filled 2016-05-02: qty 2

## 2016-05-02 MED ORDER — SODIUM CHLORIDE 0.9 % IV SOLN
Freq: Once | INTRAVENOUS | Status: AC
  Administered 2016-05-02: 13:00:00 via INTRAVENOUS

## 2016-05-02 MED ORDER — SODIUM CHLORIDE 0.9 % IV SOLN
10.0000 mg/kg | Freq: Once | INTRAVENOUS | Status: AC
  Administered 2016-05-02: 600 mg via INTRAVENOUS
  Filled 2016-05-02: qty 50

## 2016-05-02 MED ORDER — DIPHENHYDRAMINE HCL 50 MG/ML IJ SOLN
50.0000 mg | Freq: Once | INTRAMUSCULAR | Status: AC
  Administered 2016-05-02: 50 mg via INTRAVENOUS

## 2016-05-02 MED ORDER — DIPHENHYDRAMINE HCL 50 MG/ML IJ SOLN
INTRAMUSCULAR | Status: AC
  Filled 2016-05-02: qty 1

## 2016-05-02 MED ORDER — SODIUM CHLORIDE 0.9% FLUSH
10.0000 mL | INTRAVENOUS | Status: DC | PRN
  Administered 2016-05-02: 10 mL
  Filled 2016-05-02: qty 10

## 2016-05-02 MED ORDER — ACETAMINOPHEN 325 MG PO TABS
650.0000 mg | ORAL_TABLET | Freq: Once | ORAL | Status: AC
  Administered 2016-05-02: 650 mg via ORAL

## 2016-05-02 NOTE — Progress Notes (Signed)
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   PRIOR THERAPY:  1. Status post palliative radiotherapy to the left lung mass under the care of Dr. Pablo Ledger completed on 03/06/2012.  2. Systemic chemotherapy with carboplatin for AUC of 6 on day 1 and Abraxane 100 mg/M2 on days 1, 8 and 15 every 3 weeks. Status post 2 cycles. From cycle 3 forward AUC will be decreased to 4.5 given on day 1 and the Abraxane will be decreased to 90 mg per meter squared on days 1, 8 and 15 every 3 weeks, Status post a total of 3 cycles.  3. Systemic chemotherapy with carboplatin for AUC of 5 on day 1 and gemcitabine 1000 mg/m2 given on day 1 and day 8 every 3 weeks,status post 1 cycle. Due to significant neutropenia she will be dosed reduced beginning cycle 2 forward to carboplatin at an AUC of 4 given on day 1 and gemcitabine at 800 mg per meter squared given on days 1 and 8 every 3 weeks. Status post 6 cycles.  4. Status post palliative radiotherapy to the right hilum under the care of Dr. Pablo Ledger completed on 11/26/2013. 5. Immunotherapy with Nivolumab 240 MG every 2 weeks. First dose expected 03/07/2015. Status post 20 cycles, last dose was given 01/18/2016 discontinued secondary to disease progression.  CURRENT THERAPY: Systemic chemotherapy with docetaxel 75 MG/M2 and Cyramza 10 mg/KG every 3 weeks. First dose 02/08/2016. Status post 4 cycles.  CHEMOTHERAPY INTENT: Palliative  CURRENT # OF CHEMOTHERAPY CYCLES: 5  CURRENT ANTIEMETICS: Compazine  CURRENT SMOKING STATUS: Former smoker  ORAL CHEMOTHERAPY AND CONSENT: None  CURRENT BISPHOSPHONATES USE: None  PAIN MANAGEMENT: 5/10 right shoulder currently on Norco  NARCOTICS INDUCED CONSTIPATION: None  LIVING WILL AND CODE  STATUS: No CODE BLUE   INTERVAL HISTORY: Airiel Oblinger 75 y.o. female returns to the clinic today for followup visit accompanied by her son. The patient is feeling fine today and she has significant improvement in her swallowing as well as the shortness of breath. She is still on home oxygen. She is tolerating her current treatment with docetaxel and Cyramza fairly well with no significant complaints. No significant dysphagia. She denied having any significant chest pain but continues to have cough with no hemoptysis. She has no significant weight loss or night sweats. The patient denied having any significant fever or chills, nausea or vomiting. She had repeat CT scan of the chest, abdomen and pelvis performed recently and she is here for evaluation and discussion of her scan results.  MEDICAL HISTORY: Past Medical History  Diagnosis Date  . COPD (chronic obstructive pulmonary disease) ([place])   . Neutropenia, drug-induced ([place]) 05/05/2012  . Hyperlipidemia   . Rheumatoid arthritis(714.0)   . Asthma   . Hiatal hernia   . History of chemotherapy   . History of radiation therapy 03/06/2012    left hilum  . History of radiation therapy 05/10/2013-05/31/2013    Left lung/ 33/75'@2'$ .25 per fraction x 15 fractions  . Radiation 11/15/13-11/26/13    Right hilum 30 Gy in 10 fractions  . Encounter for antineoplastic immunotherapy 05/22/2015  . DNR (do not resuscitate) 06/26/2015  . Dysphagia 12/07/2015  . Non-small cell lung cancer ([place]) dx'd 08/28/11    left lung  . Primary cancer of right upper lobe  of lung ([place]) 02/06/2012    ALLERGIES:  is allergic to shellfish allergy.  MEDICATIONS:  Current Outpatient Prescriptions  Medication Sig Dispense Refill  . albuterol (PROAIR HFA) 108 (90 BASE) MCG/ACT inhaler Inhale 2 puffs into the lungs every 6 (six) hours as needed for wheezing or shortness of breath. 1 Inhaler 5  . albuterol (PROVENTIL) (2.5 MG/3ML) 0.083% nebulizer solution Take 2.5 mg by nebulization every  8 (eight) hours.    . benzonatate (TESSALON) 100 MG capsule Take 100 mg by mouth every 8 (eight) hours as needed for cough.   1  . calcium-vitamin D (OSCAL WITH D) 500-200 MG-UNIT per tablet Take 1 tablet by mouth daily.    . folic acid (FOLVITE) 161 MCG tablet Take 400 mcg by mouth daily.    . furosemide (LASIX) 20 MG tablet Take 20 mg by mouth daily as needed.  5  . guaiFENesin (MUCINEX) 600 MG 12 hr tablet Take 1 tablet (600 mg total) by mouth 2 (two) times daily. 30 tablet 1  . HYDROcodone-acetaminophen (NORCO/VICODIN) 5-325 MG tablet Take 1 tablet by mouth every 4 (four) hours as needed for moderate pain. 60 tablet 0  . ipratropium (ATROVENT) 0.02 % nebulizer solution Take 0.5 mg by nebulization every 8 (eight) hours.    . magnesium oxide (MAG-OX) 400 (241.3 Mg) MG tablet Take 1 tablet (400 mg total) by mouth 2 (two) times daily. 60 tablet 0  . morphine (MS CONTIN) 15 MG 12 hr tablet Take 1 tablet (15 mg total) by mouth every 12 (twelve) hours. 60 tablet 0  . ondansetron (ZOFRAN-ODT) 8 MG disintegrating tablet DISSOLVE 1 TABLET BY MOUTH EVERY 8 HOURS AS NEEDED FOR NAUSEA AND VOMITING 20 tablet 0  . OXYGEN Inhale 2 L into the lungs continuous.    . polyethylene glycol (MIRALAX / GLYCOLAX) packet MX AND DRK 1 PACKET PO QD MIXED WITH 8 OUNCES OF FLUID  0  . predniSONE (DELTASONE) 5 MG tablet Take 5 mg by mouth daily.    . simvastatin (ZOCOR) 5 MG tablet Take 10 mg by mouth at bedtime.     . budesonide (PULMICORT) 0.25 MG/2ML nebulizer solution Take 2 mLs (0.25 mg total) by nebulization 2 (two) times daily. 60 mL 12  . diltiazem (CARDIZEM CD) 180 MG 24 hr capsule Take 180 mg by mouth daily.    . pantoprazole (PROTONIX) 40 MG tablet Take 40 mg by mouth daily as needed (acid reflux).      No current facility-administered medications for this visit.    SURGICAL HISTORY:  Past Surgical History  Procedure Laterality Date  . Appendex  1962  . Video bronchoscopy  01/28/2012    Procedure: VIDEO  BRONCHOSCOPY WITHOUT FLUORO;  Surgeon: Brand Males, MD;  Location: [facility] ENDOSCOPY;  Service: Endoscopy;  Laterality: Bilateral;  . Surgery on right wrist      REVIEW OF SYSTEMS:  Constitutional: negative Eyes: negative Ears, nose, mouth, throat, and face: negative Respiratory: positive for cough and dyspnea on exertion Cardiovascular: negative Gastrointestinal: negative Genitourinary:negative Integument/breast: negative Hematologic/lymphatic: negative Musculoskeletal:negative Neurological: negative Behavioral/Psych: negative Endocrine: negative Allergic/Immunologic: negative   PHYSICAL EXAMINATION: General appearance: alert, cooperative and no distress Head: Normocephalic, without obvious abnormality, atraumatic Neck: no adenopathy, no JVD, supple, symmetrical, trachea midline and thyroid not enlarged, symmetric, no tenderness/mass/nodules Lymph nodes: Cervical, supraclavicular, and axillary nodes normal. Resp: clear to auscultation bilaterally Back: symmetric, no curvature. ROM normal. No CVA tenderness. Cardio: regular rate and rhythm, S1, S2 normal, no murmur, click, rub or gallop  GI: soft, non-tender; bowel sounds normal; no masses,  no organomegaly Extremities: extremities normal, atraumatic, no cyanosis or edema Neurologic: Alert and oriented X 3, normal strength and tone. Normal symmetric reflexes. Normal coordination and gait  ECOG PERFORMANCE STATUS: 1 - Symptomatic but completely ambulatory  Blood pressure 155/76, pulse 110, temperature 98.7 F (37.1 C), temperature source Oral, resp. rate 18, height 5' 0.5" (1.537 m), weight 119 lb 6.4 oz (54.159 kg), SpO2 98 %.  LABORATORY DATA: Lab Results  Component Value Date   WBC 13.3* 05/02/2016   HGB 9.7* 05/02/2016   HCT 30.4* 05/02/2016   MCV 83.9 05/02/2016   PLT 507* 05/02/2016      Chemistry      Component Value Date/Time   NA 140 05/02/2016 1105   NA 136 03/26/2016 1242   K 4.4 05/02/2016 1105   K 3.4*  03/26/2016 1242   CL 100* 03/26/2016 1242   CL 101 01/12/2013 0810   CO2 28 05/02/2016 1105   CO2 28 03/26/2016 1242   BUN 17.4 05/02/2016 1105   BUN 18 03/26/2016 1242   CREATININE 0.7 05/02/2016 1105   CREATININE 0.73 03/26/2016 1242      Component Value Date/Time   CALCIUM 9.4 05/02/2016 1105   CALCIUM 8.9 03/26/2016 1242   ALKPHOS 93 05/02/2016 1105   ALKPHOS 88 03/26/2016 1242   AST 12 05/02/2016 1105   AST 14* 03/26/2016 1242   ALT 10 05/02/2016 1105   ALT 13* 03/26/2016 1242   BILITOT <0.30 05/02/2016 1105   BILITOT 1.1 03/26/2016 1242       RADIOGRAPHIC STUDIES: Ct Chest W Contrast  04/16/2016  CLINICAL DATA:  75 year old female with a history of left upper lobe squamous cell carcinoma diagnosed April 2013 status post radiation therapy and chemotherapy with ongoing immunotherapy, presenting for restaging. EXAM: CT CHEST, ABDOMEN, AND PELVIS WITH CONTRAST TECHNIQUE: Multidetector CT imaging of the chest, abdomen and pelvis was performed following the standard protocol during bolus administration of intravenous contrast. CONTRAST:  33m ISOVUE-300 IOPAMIDOL (ISOVUE-300) INJECTION 61% COMPARISON:  02/12/2016 CT chest, abdomen and pelvis. FINDINGS: CT CHEST Mediastinum/Nodes: Normal heart size. No significant pericardial fluid/thickening. Left main, left anterior descending and left circumflex coronary atherosclerosis. Right internal jugular MediPort terminates in the lower third of the superior vena cava. Atherosclerotic nonaneurysmal thoracic aorta. Normal caliber pulmonary arteries. No central pulmonary emboli. Stable subcentimeter hypodense right thyroid lobe nodule. Unremarkable esophagus. Right upper paratracheal 3.9 x 3.7 cm infiltrative node (series 2/image 14) is decreased from 7.1 x 4.5 cm. Mildly enlarged 1.0 cm subcarinal node (series 2/ image 25), previously 1.2 cm, slightly decreased. No additional pathologically enlarged mediastinal nodes. No pathologically enlarged  hilar nodes. Lungs/Pleura: No pneumothorax. No pleural effusion. Layering secretions in the upper thoracic trachea. Moderate to severe centrilobular emphysema with diffuse bronchial wall thickening. Stable sharply marginated consolidation, distortion and volume loss in the parahilar left upper lung, consistent with radiation fibrosis. Improved aeration of the right middle lobe with decreased mucous plugging of the narrowed right middle lobe bronchus. A residual 2.7 x 1.4 cm focus of consolidation in the central right middle lobe (series 4/ image 75) was previously obscured by complete right middle lobe atelectasis, and is decreased and favored to represent residual atelectasis, with a component of tumor not entirely excluded. Clustered nodularity in the right lower lobe is decreased with the dominant 3 mm right lower lobe pulmonary nodule (series 4/ image 83), decreased from 6 mm. No acute consolidative airspace disease or new significant pulmonary  nodules. Musculoskeletal: No aggressive appearing focal osseous lesions. Stable mild chronic T4 vertebral compression fracture. Mild thoracic spondylosis. CT ABDOMEN AND PELVIS Hepatobiliary: Normal liver size. Stable granulomatous calcification in the right liver lobe. No new or suspicious liver masses. Hypervascular 1.1 cm and 0.9 cm liver foci in the segment 4B left liver lobe have the appearance of venovenous shunts and are unchanged since 05/07/2012, consistent with benign foci. Normal gallbladder with no radiopaque cholelithiasis. No biliary ductal dilatation. Pancreas: Normal, with no mass or duct dilation. Spleen: Normal size. No mass. Adrenals/Urinary Tract: Normal adrenals. Subcentimeter hypodense renal cortical lesion in the interpolar left kidney is too small to characterize and stable, suggesting a benign renal cysts. Otherwise normal kidneys, with no hydronephrosis. Normal bladder. Stomach/Bowel: Grossly normal stomach. Normal caliber small bowel with no  small bowel wall thickening. Appendectomy. Normal large bowel with no diverticulosis, large bowel wall thickening or pericolonic fat stranding. Vascular/Lymphatic: Atherosclerotic nonaneurysmal abdominal aorta. Patent portal, splenic, hepatic and renal veins. No pathologically enlarged lymph nodes in the abdomen or pelvis. Reproductive: Grossly normal uterus.  No adnexal mass. Other: No pneumoperitoneum, ascites or focal fluid collection. Musculoskeletal: Stable cortical and trabecular thickening and bony expansion in the visualized right proximal femur, most suggestive of Paget disease. Moderate degenerative changes in the lumbar spine. No new suspicious focal osseous lesions. IMPRESSION: 1. No new or progressive metastatic disease. 2. Dominant infiltrative right upper paratracheal node has significantly decreased in size. Mild subcarinal adenopathy is slightly decreased. 3. Right lower lobe nodularity is decreased. 4. Improved aeration in the right middle lobe. Residual consolidation in the central right middle lobe could represent residual atelectasis and/or tumor, and is decreased. 5. Stable left upper parahilar radiation fibrosis . 6. 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 Electronically Signed   By: Ilona Sorrel M.D.   On: 04/16/2016 10:14   Ct Abdomen Pelvis W Contrast  04/16/2016  CLINICAL DATA:  75 year old female with a history of left upper lobe squamous cell carcinoma diagnosed April 2013 status post radiation therapy and chemotherapy with ongoing immunotherapy, presenting for restaging. EXAM: CT CHEST, ABDOMEN, AND PELVIS WITH CONTRAST TECHNIQUE: Multidetector CT imaging of the chest, abdomen and pelvis was performed following the standard protocol during bolus administration of  intravenous contrast. CONTRAST:  70m ISOVUE-300 IOPAMIDOL (ISOVUE-300) INJECTION 61% COMPARISON:  02/12/2016 CT chest, abdomen and pelvis. FINDINGS: CT CHEST Mediastinum/Nodes: Normal heart size. No significant pericardial fluid/thickening. Left main, left anterior descending and left circumflex coronary atherosclerosis. Right internal jugular MediPort terminates in the lower third of the superior vena cava. Atherosclerotic nonaneurysmal thoracic aorta. Normal caliber pulmonary arteries. No central pulmonary emboli. Stable subcentimeter hypodense right thyroid lobe nodule. Unremarkable esophagus. Right upper paratracheal 3.9 x 3.7 cm infiltrative node (series 2/image 14) is decreased from 7.1 x 4.5 cm. Mildly enlarged 1.0 cm subcarinal node (series 2/ image 25), previously 1.2 cm, slightly decreased. No additional pathologically enlarged mediastinal nodes. No pathologically enlarged hilar nodes. Lungs/Pleura: No pneumothorax. No pleural effusion. Layering secretions in the upper thoracic trachea. Moderate to severe centrilobular emphysema with diffuse bronchial wall thickening. Stable sharply marginated consolidation, distortion and volume loss in the parahilar left upper lung, consistent with radiation fibrosis. Improved aeration of the right middle lobe with decreased mucous plugging of the narrowed right middle lobe bronchus. A residual 2.7 x 1.4 cm focus of consolidation in the central  right middle lobe (series 4/ image 75) was previously obscured by complete right middle lobe atelectasis, and is decreased and favored to represent residual atelectasis, with a component of tumor not entirely excluded. Clustered nodularity in the right lower lobe is decreased with the dominant 3 mm right lower lobe pulmonary nodule (series 4/ image 83), decreased from 6 mm. No acute consolidative airspace disease or new significant pulmonary nodules. Musculoskeletal: No aggressive appearing focal osseous lesions. Stable mild  chronic T4 vertebral compression fracture. Mild thoracic spondylosis. CT ABDOMEN AND PELVIS Hepatobiliary: Normal liver size. Stable granulomatous calcification in the right liver lobe. No new or suspicious liver masses. Hypervascular 1.1 cm and 0.9 cm liver foci in the segment 4B left liver lobe have the appearance of venovenous shunts and are unchanged since 05/07/2012, consistent with benign foci. Normal gallbladder with no radiopaque cholelithiasis. No biliary ductal dilatation. Pancreas: Normal, with no mass or duct dilation. Spleen: Normal size. No mass. Adrenals/Urinary Tract: Normal adrenals. Subcentimeter hypodense renal cortical lesion in the interpolar left kidney is too small to characterize and stable, suggesting a benign renal cysts. Otherwise normal kidneys, with no hydronephrosis. Normal bladder. Stomach/Bowel: Grossly normal stomach. Normal caliber small bowel with no small bowel wall thickening. Appendectomy. Normal large bowel with no diverticulosis, large bowel wall thickening or pericolonic fat stranding. Vascular/Lymphatic: Atherosclerotic nonaneurysmal abdominal aorta. Patent portal, splenic, hepatic and renal veins. No pathologically enlarged lymph nodes in the abdomen or pelvis. Reproductive: Grossly normal uterus.  No adnexal mass. Other: No pneumoperitoneum, ascites or focal fluid collection. Musculoskeletal: Stable cortical and trabecular thickening and bony expansion in the visualized right proximal femur, most suggestive of Paget disease. Moderate degenerative changes in the lumbar spine. No new suspicious focal osseous lesions. IMPRESSION: 1. No new or progressive metastatic disease. 2. Dominant infiltrative right upper paratracheal node has significantly decreased in size. Mild subcarinal adenopathy is slightly decreased. 3. Right lower lobe nodularity is decreased. 4. Improved aeration in the right middle lobe. Residual consolidation in the central right middle lobe could represent  residual atelectasis and/or tumor, and is decreased. 5. Stable left upper parahilar radiation fibrosis . 6. 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 Electronically Signed   By: Ilona Sorrel M.D.   On: 04/16/2016 10:14   ASSESSMENT AND PLAN: This is a very pleasant 75 years old African American female with metastatic non-small cell lung cancer, squamous cell carcinoma status post several chemotherapy regimen and and completed a course of palliative radiotherapy to the right hilum in February 2015.  She completed a course of treatment with immunotherapy with Nivolumab status post 20 cycles. This was discontinued today secondary to disease progression with further enlargement of the large right paratracheal lymph node. I discussed the scan results and showed the images to the patient and her son. I recommended for her to discontinue her current treatment with Nivolumab at this point. She is currently on systemic chemotherapy with docetaxel 75 MG/M2 and Cyramza 10 MG/KG every 3 weeks with Neulasta support. Status post 4 cycles and tolerating her treatment well. The recent CT scan of the chest, abdomen and pelvis showed significant improvement in the right upper lobe paratracheal node as well as decrease in the subcarinal adenopathy and right lower lobe nodularity. I discussed the scan results with the patient and her son and showed them the images. I recommended for the  patient to continue her current treatment with docetaxel and Cyramza and she will proceed with cycle #5 today as a scheduled. She would come back for follow-up visit in 3 weeks for evaluation before starting cycle #6.  For pain management, she will continue on MS Contin in addition to Vicodin. For the cough, she will continue on  Tussinox. She was advised to call immediately if she has any concerning symptoms in the interval.  The patient voices understanding of current disease status and treatment options and is in agreement with the current care plan.  All questions were answered. The patient knows to call the clinic with any problems, questions or concerns. We can certainly see the patient much sooner if necessary.  Disclaimer: This note was dictated with voice recognition software. Similar sounding words can inadvertently be transcribed and may not be corrected upon review.

## 2016-05-02 NOTE — Patient Instructions (Signed)
[facility] Discharge Instructions for Patients Receiving Chemotherapy  Today you received the following chemotherapy agents Cyramza/Docetaxel.  To help prevent nausea and vomiting after your treatment, we encourage you to take your nausea medication as directed.   If you develop nausea and vomiting that is not controlled by your nausea medication, call the clinic.   BELOW ARE SYMPTOMS THAT SHOULD BE REPORTED IMMEDIATELY:  *FEVER GREATER THAN 100.5 F  *CHILLS WITH OR WITHOUT FEVER  NAUSEA AND VOMITING THAT IS NOT CONTROLLED WITH YOUR NAUSEA MEDICATION  *UNUSUAL SHORTNESS OF BREATH  *UNUSUAL BRUISING OR BLEEDING  TENDERNESS IN MOUTH AND THROAT WITH OR WITHOUT PRESENCE OF ULCERS  *URINARY PROBLEMS  *BOWEL PROBLEMS  UNUSUAL RASH Items with * indicate a potential emergency and should be followed up as soon as possible.  Feel free to call the clinic you have any questions or concerns. The clinic phone number is [phone].  Please show the [facility] at check-in to the Emergency Department and triage nurse.

## 2016-05-02 NOTE — Telephone Encounter (Signed)
per pof to sch pt appt-appts already sch

## 2016-05-04 ENCOUNTER — Ambulatory Visit (HOSPITAL_BASED_OUTPATIENT_CLINIC_OR_DEPARTMENT_OTHER): Payer: Medicare Other

## 2016-05-04 VITALS — BP 139/67 | HR 97 | Temp 98.0°F | Resp 20

## 2016-05-04 DIAGNOSIS — C3411 Malignant neoplasm of upper lobe, right bronchus or lung: Secondary | ICD-10-CM | POA: Diagnosis present

## 2016-05-04 MED ORDER — PEGFILGRASTIM INJECTION 6 MG/0.6ML ~~LOC~~
6.0000 mg | PREFILLED_SYRINGE | Freq: Once | SUBCUTANEOUS | Status: AC
  Administered 2016-05-04: 6 mg via SUBCUTANEOUS

## 2016-05-07 ENCOUNTER — Emergency Department (HOSPITAL_COMMUNITY): Payer: Medicare Other

## 2016-05-07 ENCOUNTER — Encounter (HOSPITAL_COMMUNITY): Payer: Self-pay

## 2016-05-07 ENCOUNTER — Emergency Department (HOSPITAL_COMMUNITY)
Admission: EM | Admit: 2016-05-07 | Discharge: 2016-05-07 | Disposition: A | Discharge status: Home / Self Care | Payer: Medicare Other | Attending: Emergency Medicine | Admitting: Emergency Medicine

## 2016-05-07 ENCOUNTER — Ambulatory Visit (HOSPITAL_BASED_OUTPATIENT_CLINIC_OR_DEPARTMENT_OTHER): Payer: Medicare Other

## 2016-05-07 ENCOUNTER — Other Ambulatory Visit: Payer: Self-pay

## 2016-05-07 ENCOUNTER — Encounter: Payer: Self-pay | Admitting: Nurse Practitioner

## 2016-05-07 ENCOUNTER — Telehealth: Payer: Self-pay | Admitting: *Deleted

## 2016-05-07 ENCOUNTER — Ambulatory Visit (HOSPITAL_BASED_OUTPATIENT_CLINIC_OR_DEPARTMENT_OTHER): Payer: Medicare Other | Admitting: Nurse Practitioner

## 2016-05-07 VITALS — BP 118/56 | HR 117 | Temp 98.8°F | Resp 20 | Ht 60.5 in | Wt 109.8 lb

## 2016-05-07 DIAGNOSIS — J449 Chronic obstructive pulmonary disease, unspecified: Secondary | ICD-10-CM | POA: Diagnosis not present

## 2016-05-07 DIAGNOSIS — E785 Hyperlipidemia, unspecified: Secondary | ICD-10-CM | POA: Insufficient documentation

## 2016-05-07 DIAGNOSIS — Z85118 Personal history of other malignant neoplasm of bronchus and lung: Secondary | ICD-10-CM | POA: Diagnosis not present

## 2016-05-07 DIAGNOSIS — R1310 Dysphagia, unspecified: Secondary | ICD-10-CM

## 2016-05-07 DIAGNOSIS — R0600 Dyspnea, unspecified: Secondary | ICD-10-CM | POA: Diagnosis not present

## 2016-05-07 DIAGNOSIS — R110 Nausea: Secondary | ICD-10-CM | POA: Insufficient documentation

## 2016-05-07 DIAGNOSIS — J45909 Unspecified asthma, uncomplicated: Secondary | ICD-10-CM | POA: Insufficient documentation

## 2016-05-07 DIAGNOSIS — W458XXA Other foreign body or object entering through skin, initial encounter: Secondary | ICD-10-CM | POA: Diagnosis not present

## 2016-05-07 DIAGNOSIS — C3411 Malignant neoplasm of upper lobe, right bronchus or lung: Secondary | ICD-10-CM

## 2016-05-07 DIAGNOSIS — Y999 Unspecified external cause status: Secondary | ICD-10-CM | POA: Diagnosis not present

## 2016-05-07 DIAGNOSIS — R0602 Shortness of breath: Secondary | ICD-10-CM | POA: Diagnosis present

## 2016-05-07 DIAGNOSIS — Z87891 Personal history of nicotine dependence: Secondary | ICD-10-CM | POA: Insufficient documentation

## 2016-05-07 DIAGNOSIS — Z79899 Other long term (current) drug therapy: Secondary | ICD-10-CM | POA: Insufficient documentation

## 2016-05-07 DIAGNOSIS — Z79891 Long term (current) use of opiate analgesic: Secondary | ICD-10-CM | POA: Diagnosis not present

## 2016-05-07 DIAGNOSIS — E860 Dehydration: Secondary | ICD-10-CM | POA: Diagnosis not present

## 2016-05-07 DIAGNOSIS — T18108A Unspecified foreign body in esophagus causing other injury, initial encounter: Secondary | ICD-10-CM | POA: Diagnosis not present

## 2016-05-07 DIAGNOSIS — Y939 Activity, unspecified: Secondary | ICD-10-CM | POA: Insufficient documentation

## 2016-05-07 DIAGNOSIS — Z7951 Long term (current) use of inhaled steroids: Secondary | ICD-10-CM | POA: Insufficient documentation

## 2016-05-07 DIAGNOSIS — Z95828 Presence of other vascular implants and grafts: Secondary | ICD-10-CM

## 2016-05-07 DIAGNOSIS — Y929 Unspecified place or not applicable: Secondary | ICD-10-CM | POA: Insufficient documentation

## 2016-05-07 DIAGNOSIS — K1230 Oral mucositis (ulcerative), unspecified: Secondary | ICD-10-CM

## 2016-05-07 DIAGNOSIS — C3490 Malignant neoplasm of unspecified part of unspecified bronchus or lung: Secondary | ICD-10-CM | POA: Diagnosis not present

## 2016-05-07 LAB — CBC WITH DIFFERENTIAL/PLATELET
BASO%: 0.2 % (ref 0.0–2.0)
BASOS ABS: 0 10*3/uL (ref 0.0–0.1)
EOS ABS: 0.1 10*3/uL (ref 0.0–0.5)
EOS%: 0.6 % (ref 0.0–7.0)
HCT: 31.9 % — ABNORMAL LOW (ref 34.8–46.6)
HEMOGLOBIN: 10.1 g/dL — AB (ref 11.6–15.9)
LYMPH%: 7.7 % — AB (ref 14.0–49.7)
MCH: 26.9 pg (ref 25.1–34.0)
MCHC: 31.7 g/dL (ref 31.5–36.0)
MCV: 84.8 fL (ref 79.5–101.0)
MONO#: 0.5 10*3/uL (ref 0.1–0.9)
MONO%: 3 % (ref 0.0–14.0)
NEUT#: 14.5 10*3/uL — ABNORMAL HIGH (ref 1.5–6.5)
NEUT%: 88.5 % — AB (ref 38.4–76.8)
Platelets: 272 10*3/uL (ref 145–400)
RBC: 3.76 10*6/uL (ref 3.70–5.45)
RDW: 18.9 % — AB (ref 11.2–14.5)
WBC: 16.4 10*3/uL — ABNORMAL HIGH (ref 3.9–10.3)
lymph#: 1.3 10*3/uL (ref 0.9–3.3)

## 2016-05-07 LAB — COMPREHENSIVE METABOLIC PANEL
ALT: 11 U/L (ref 0–55)
ANION GAP: 10 meq/L (ref 3–11)
AST: 12 U/L (ref 5–34)
Albumin: 3.3 g/dL — ABNORMAL LOW (ref 3.5–5.0)
Alkaline Phosphatase: 108 U/L (ref 40–150)
BILIRUBIN TOTAL: 0.92 mg/dL (ref 0.20–1.20)
BUN: 14 mg/dL (ref 7.0–26.0)
CHLORIDE: 99 meq/L (ref 98–109)
CO2: 30 meq/L — AB (ref 22–29)
Calcium: 9.5 mg/dL (ref 8.4–10.4)
Creatinine: 0.8 mg/dL (ref 0.6–1.1)
EGFR: 83 mL/min/{1.73_m2} — AB (ref >90)
GLUCOSE: 181 mg/dL — AB (ref 70–140)
POTASSIUM: 3.8 meq/L (ref 3.5–5.1)
SODIUM: 139 meq/L (ref 136–145)
TOTAL PROTEIN: 6.6 g/dL (ref 6.4–8.3)

## 2016-05-07 MED ORDER — SODIUM CHLORIDE 0.9 % IJ SOLN
10.0000 mL | INTRAMUSCULAR | Status: DC | PRN
  Administered 2016-05-07: 10 mL via INTRAVENOUS
  Filled 2016-05-07: qty 10

## 2016-05-07 MED ORDER — IOPAMIDOL (ISOVUE-300) INJECTION 61%
75.0000 mL | Freq: Once | INTRAVENOUS | Status: AC | PRN
  Administered 2016-05-07: 75 mL via INTRAVENOUS

## 2016-05-07 MED ORDER — SODIUM CHLORIDE 0.9 % IV BOLUS (SEPSIS)
500.0000 mL | Freq: Once | INTRAVENOUS | Status: AC
  Administered 2016-05-07: 500 mL via INTRAVENOUS

## 2016-05-07 MED ORDER — MORPHINE SULFATE ER 15 MG PO TBCR
15.0000 mg | EXTENDED_RELEASE_TABLET | Freq: Two times a day (BID) | ORAL | Status: DC
  Filled 2016-05-07: qty 1

## 2016-05-07 MED ORDER — ALBUTEROL SULFATE (2.5 MG/3ML) 0.083% IN NEBU
INHALATION_SOLUTION | RESPIRATORY_TRACT | Status: AC
  Filled 2016-05-07: qty 3

## 2016-05-07 MED ORDER — ONDANSETRON HCL 4 MG/2ML IJ SOLN
4.0000 mg | Freq: Once | INTRAMUSCULAR | Status: AC
  Administered 2016-05-07: 4 mg via INTRAVENOUS
  Filled 2016-05-07: qty 2

## 2016-05-07 MED ORDER — SODIUM CHLORIDE 0.9 % IV SOLN
1000.0000 mL | Freq: Once | INTRAVENOUS | Status: AC
  Administered 2016-05-07: 1000 mL via INTRAVENOUS

## 2016-05-07 MED ORDER — HYDROCODONE-ACETAMINOPHEN 5-325 MG PO TABS
1.0000 | ORAL_TABLET | Freq: Once | ORAL | Status: AC
  Administered 2016-05-07: 1 via ORAL
  Filled 2016-05-07: qty 1

## 2016-05-07 MED ORDER — HEPARIN SOD (PORK) LOCK FLUSH 100 UNIT/ML IV SOLN
500.0000 [IU] | Freq: Once | INTRAVENOUS | Status: AC
  Administered 2016-05-07: 500 [IU]
  Filled 2016-05-07: qty 5

## 2016-05-07 MED ORDER — GLUCAGON HCL RDNA (DIAGNOSTIC) 1 MG IJ SOLR
1.0000 mg | Freq: Once | INTRAMUSCULAR | Status: AC
  Administered 2016-05-07: 1 mg via INTRAVENOUS
  Filled 2016-05-07: qty 1

## 2016-05-07 MED ORDER — ALBUTEROL SULFATE (2.5 MG/3ML) 0.083% IN NEBU
2.5000 mg | INHALATION_SOLUTION | Freq: Once | RESPIRATORY_TRACT | Status: AC
  Administered 2016-05-07: 2.5 mg via RESPIRATORY_TRACT
  Filled 2016-05-07: qty 3

## 2016-05-07 NOTE — ED Provider Notes (Signed)
CSN: [id number]     Arrival date & time 05/07/16  1531 History   First MD Initiated Contact with Patient 05/07/16 1540     Chief Complaint  Patient presents with  . Dysphagia  . Shortness of Breath     Patient is a 75 y.o. female presenting with shortness of breath. The history is provided by the patient.  Shortness of Breath Associated symptoms: cough   Associated symptoms: no abdominal pain and no headaches   Patient was sent over from cancer center. She states that on Sunday she was taking her pills and felt to get caught in her throat. She states she then threw them up. She states since then she's been having difficulty eating anything. States she has to spit out her saliva although she is not currently spitting it out. States she's been able take her pills. States she's been able to eat. States she's been able take her pain pills because she is grounded him up and put them in Jell-O. States she's had some shortness of breath 2. No fevers. Seen at [facility] and sent over here. History of lung cancer.  Past Medical History  Diagnosis Date  . COPD (chronic obstructive pulmonary disease) ([place])   . Neutropenia, drug-induced ([place]) 05/05/2012  . Hyperlipidemia   . Rheumatoid arthritis(714.0)   . Asthma   . Hiatal hernia   . History of chemotherapy   . History of radiation therapy 03/06/2012    left hilum  . History of radiation therapy 05/10/2013-05/31/2013    Left lung/ 33/75'@2'$ .25 per fraction x 15 fractions  . Radiation 11/15/13-11/26/13    Right hilum 30 Gy in 10 fractions  . Encounter for antineoplastic immunotherapy 05/22/2015  . DNR (do not resuscitate) 06/26/2015  . Dysphagia 12/07/2015  . Non-small cell lung cancer ([place]) dx'd 08/28/11    left lung  . Primary cancer of right upper lobe of lung ([place]) 02/06/2012   Past Surgical History  Procedure Laterality Date  . Appendex  1962  . Video bronchoscopy  01/28/2012    Procedure: VIDEO BRONCHOSCOPY WITHOUT FLUORO;  Surgeon: Brand Males, MD;  Location: [facility] ENDOSCOPY;  Service: Endoscopy;  Laterality: Bilateral;  . Surgery on right wrist     Family History  Problem Relation Age of Onset  . Diabetes Mother     insulin dependent  . Breast cancer Mother    Social History  Substance Use Topics  . Smoking status: Former Smoker -- 0.50 packs/day for 40 years    Types: Cigarettes    Quit date: 10/29/2009  . Smokeless tobacco: Never Used  . Alcohol Use: No   OB History    No data available     Review of Systems  Constitutional: Negative for appetite change and fatigue.  HENT: Positive for trouble swallowing.   Respiratory: Positive for cough and shortness of breath.   Gastrointestinal: Positive for nausea. Negative for abdominal pain.  Genitourinary: Negative for flank pain.  Musculoskeletal: Negative for back pain.  Skin: Negative for wound.  Neurological: Negative for headaches.  Hematological: Negative for adenopathy.  Psychiatric/Behavioral: Negative for confusion.      Allergies  Shellfish allergy  Home Medications   Prior to Admission medications   Medication Sig Start Date End Date Taking? Authorizing Provider  albuterol (PROAIR HFA) 108 (90 BASE) MCG/ACT inhaler Inhale 2 puffs into the lungs every 6 (six) hours as needed for wheezing or shortness of breath. 08/31/15  Yes Brand Males, MD  budesonide (PULMICORT) 0.25 MG/2ML nebulizer  solution Take 2 mLs (0.25 mg total) by nebulization 2 (two) times daily. 08/20/15  Yes Belkys A Regalado, MD  calcium-vitamin D (OSCAL WITH D) 500-200 MG-UNIT per tablet Take 1 tablet by mouth daily.   Yes Historical Provider, MD  diltiazem (DILACOR XR) 180 MG 24 hr capsule Take 180 mg by mouth every evening.    Yes Historical Provider, MD  folic acid (FOLVITE) 045 MCG tablet Take 400 mcg by mouth daily.   Yes Historical Provider, MD  furosemide (LASIX) 20 MG tablet Take 20 mg by mouth daily.  04/30/16  Yes Historical Provider, MD  guaiFENesin (MUCINEX) 600 MG  12 hr tablet Take 1 tablet (600 mg total) by mouth 2 (two) times daily. 06/27/15  Yes Reyne Dumas, MD  HYDROcodone-acetaminophen (NORCO/VICODIN) 5-325 MG tablet Take 1 tablet by mouth every 4 (four) hours as needed for moderate pain. 04/18/16  Yes Curt Bears, MD  magnesium oxide (MAG-OX) 400 (241.3 Mg) MG tablet Take 1 tablet (400 mg total) by mouth 2 (two) times daily. 02/18/16  Yes Maryann Mikhail, DO  morphine (MS CONTIN) 15 MG 12 hr tablet Take 1 tablet (15 mg total) by mouth every 12 (twelve) hours. 04/22/16  Yes Curt Bears, MD  ondansetron (ZOFRAN-ODT) 8 MG disintegrating tablet DISSOLVE 1 TABLET BY MOUTH EVERY 8 HOURS AS NEEDED FOR NAUSEA AND VOMITING 04/04/16  Yes Curt Bears, MD  predniSONE (DELTASONE) 5 MG tablet Take 5 mg by mouth daily. 02/28/16  Yes Historical Provider, MD  simvastatin (ZOCOR) 5 MG tablet Take 10 mg by mouth at bedtime.    Yes Historical Provider, MD  benzonatate (TESSALON) 100 MG capsule Take 100 mg by mouth every 8 (eight) hours as needed for cough.  01/05/16   Historical Provider, MD  dexamethasone (DECADRON) 4 MG tablet Take 4-12 mg by mouth as directed. Instructions: Every 3 weeks: Take 2 tablets the day before chemo, Take 3 tablets the day of chemo, Take 3 tablets the day after chemo. 04/09/16   Historical Provider, MD  metoCLOPramide (REGLAN) 10 MG tablet Take 10 mg by mouth every 6 (six) hours as needed for nausea or vomiting.  03/29/16   Historical Provider, MD  OXYGEN Inhale 2 L into the lungs continuous.    Historical Provider, MD  pantoprazole (PROTONIX) 40 MG tablet Take 40 mg by mouth daily as needed (acid reflux).  01/16/16 03/27/16  Historical Provider, MD  polyethylene glycol (MIRALAX / GLYCOLAX) packet MX AND DRK 1 PACKET PO QD MIXED WITH 8 OUNCES OF FLUID 02/22/16   Historical Provider, MD   BP 149/81 mmHg  Pulse 107  Temp(Src) 98.3 F (36.8 C) (Oral)  Resp 23  SpO2 100% Physical Exam  Constitutional: She appears well-developed.  HENT:  Head:  Atraumatic.  Eyes: EOM are normal.  Neck: Neck supple.  Pulmonary/Chest:  Mildly harsh breath sounds  Abdominal: Soft.  Musculoskeletal: She exhibits no edema.  Neurological: She is alert.  Skin: Skin is warm.    ED Course  Procedures (including critical care time) Labs Review Labs Reviewed - No data to display  Imaging Review Dg Chest 2 View  05/07/2016  CLINICAL DATA:  Vomiting. Globus sensation Lung cancer. Query esophageal form body. EXAM: CHEST  2 VIEW COMPARISON:  04/16/2016 FINDINGS: Power injectable right Port-A-Cath tip: SVC. Atherosclerotic aortic arch. Right paramediastinal mass observed along the upper mediastinum. Emphysema is present. Left perihilar opacity likely firm radiation fibrosis. Right perihilar density, unchanged from 04/16/2016. Calcified AP window lymph node noted. Several upper to mid  thoracic wedge compressions are noted. Scarring or atelectasis inferiorly in the right middle lobe. IMPRESSION: 1. Generally similar appearance of the chest to 6/20 7/17, including a right upper paramediastinal tumor, perihilar densities likely therapy related, scarring or atelectasis inferiorly in the right middle lobe coma and atherosclerosis. 2. Stable appearance of several upper to mid thoracic compression fractures. Electronically Signed   By: Van Clines M.D.   On: 05/07/2016 16:11   Ct Soft Tissue Neck W Contrast  05/07/2016  CLINICAL DATA:  Difficulty swallowing. Lung cancer. On chemotherapy. EXAM: CT NECK WITH CONTRAST TECHNIQUE: Multidetector CT imaging of the neck was performed using the standard protocol following the bolus administration of intravenous contrast. CONTRAST:  73m ISOVUE-300 IOPAMIDOL (ISOVUE-300) INJECTION 61% COMPARISON:  MRI brain 02/18/2012, chest CT 04/16/2016 FINDINGS: Pharynx and larynx: The nasopharynx is clear. The oropharynx and hypopharynx are normal. The epiglottis is normal. The supraglottic larynx, glottis and subglottic larynx are normal.  Salivary glands: The parotid and submandibular glands are normal. No sialolithiasis or salivary ductal dilatation. Thyroid: Bilateral subcentimeter hypodense thyroid nodules. Lymph nodes: There is no cervical adenopathy. There is a partially necrotic right paratracheal nodal mass measuring 4.1 x 3.3 cm, previously 3.7 x 3.9 cm. Vascular: There is a right IJ approach Port-A-Cath with tip terminating beyond the field of view. There is aortic atherosclerosis. There is mild atherosclerotic plaque at both aortic bifurcations. The bilateral internal carotid arteries are widely patent. There is a diminutive right vertebral artery. Left vertebral artery is normal. Limited intracranial: Unremarkable Visualized orbits: Normal Mastoids and visualized paranasal sinuses: Clear Skeleton: Multilevel mild cervical spondylosis without advanced spinal canal stenosis. No discrete lytic or blastic osseous lesions. Unchanged mid thoracic compression fracture. Upper chest: Left perihilar radiation fibrosis is unchanged. No discrete pulmonary nodules or masses are identified. Findings of emphysema re- demonstrated. No visualized pleural effusion or pneumothorax. IMPRESSION: 1. No significant interval change in the size of right peritracheal partially necrotic lymphadenopathy. 2. No new findings to explain the reported difficulty swallowing. Normal appearance of the pharynx and larynx. 3. No cervical adenopathy or evidence metastatic disease to the neck. Electronically Signed   By: KUlyses JarredM.D.   On: 05/07/2016 16:45   I have personally reviewed and evaluated these images and lab results as part of my medical decision-making.   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 Has had previous radiation. Had a feeling of food getting stuck. After glucagon was feeling a improved. Likely has some dehydration. Discussed with GI who  recommended a barium swallow which fortunately will be able to get done at this after hours time. Will discharge home after it to follow-up with gastroenterology.  NDavonna Belling MD 05/07/16 1800

## 2016-05-07 NOTE — Assessment & Plan Note (Signed)
Patient received cycle 5 of her Taxotere/Cyramza chemotherapy regimen on 05/02/2016.  She received Neulasta for growth factor support following her chemotherapy.  Patient presented to the [facility] today with complaints of dysphagia.  She states that any food or drink that she attempts becomes lodged in her throat and she ends up vomiting it back up.  She also feels increasingly short of breath as well.  She denies any chest pain, chest pressure, or pain with inspiration.  She feels dehydrated today.  See further notes for details of today's visit.  Patient is scheduled for labs on 05/09/2016 and 05/16/2016.  She is scheduled for labs, flush, visit, and chemotherapy on 05/23/2016.  Note: Per Dr. Julien Nordmann-.  Patient has refused hospice in the past.  Patient has indication of no code blue in her chart.  This provider was unable to find the actual documented in patient's chart.

## 2016-05-07 NOTE — ED Notes (Signed)
Pt. Transported to Endo

## 2016-05-07 NOTE — Progress Notes (Signed)
SYMPTOM MANAGEMENT CLINIC    Chief Complaint: Shortness of breath and dysphagia.  HPI:  Destiny Harrison 75 y.o. female diagnosed with lung cancer.  Currently undergoing Taxotere/Cyramza palliative chemotherapy.  Patient states that she typically has some very mild shortness of breath; and remains on 2 L O2 via nasal cannula on a 24/7 basis.  However, patient states that the past few days she has been experiencing dysphagia and intermittent nausea/vomiting when she attempts to drink or eat.  She has had little oral intake recently; and feels dehydrated.  She also feels increasingly short of breath as well.  Patient denies any chest pain, chest pressure, or pain with inspiration.  She also denies any recent fevers or chills.  Exam today revealed.  Patient on 2 L via nasal cannula.  O2 sat was 100%.  However, patient was noted to have rhonchi and wheezes to all lung fields.  She also appeared short of breath and had increased work of breathing as well.  Patient received an albuterol nebulizer treatment while in the cancer Center.  Dr. Julien Nordmann in to examine patient as well; and he confirmed that this is not patient's typical baseline.  Patient's last restaging scan obtained on 04/16/2016 revealed that the right paratracheal node decreased in size.  However, there is concern that there may be a partial obstruction in that region at this time.  Should consider further imaging with a possible CT neck for a CT angio chest for further evaluation.  Patient received Percocet 350 ML's normal saline IV fluid rehydration while cancer Center today.  Patient will be transported to the emergency department for further evaluation and management today.  Brief history and report were called to the emergency department charge nurse; prior to the patient being transported to the emergency department via wheelchair.  Per the cancer Center nurse.  Documentation in patient's chart notes that patient is considered a no CODE  BLUE; there is no actual living will document noted in the chart.  Patient has refused hospice in the past   No history exists.    Review of Systems  Constitutional: Positive for weight loss and malaise/fatigue.  Respiratory: Positive for cough, shortness of breath and wheezing. Negative for stridor.   Gastrointestinal: Positive for nausea and vomiting.  Neurological: Positive for weakness.  All other systems reviewed and are negative.   Past Medical History  Diagnosis Date  . COPD (chronic obstructive pulmonary disease) ([place])   . Neutropenia, drug-induced ([place]) 05/05/2012  . Hyperlipidemia   . Rheumatoid arthritis(714.0)   . Asthma   . Hiatal hernia   . History of chemotherapy   . History of radiation therapy 03/06/2012    left hilum  . History of radiation therapy 05/10/2013-05/31/2013    Left lung/ 33/75_0 .25 per fraction x 15 fractions  . Radiation 11/15/13-11/26/13    Right hilum 30 Gy in 10 fractions  . Encounter for antineoplastic immunotherapy 05/22/2015  . DNR (do not resuscitate) 06/26/2015  . Dysphagia 12/07/2015  . Non-small cell lung cancer ([place]) dx'd 08/28/11    left lung  . Primary cancer of right upper lobe of lung ([place]) 02/06/2012    Past Surgical History  Procedure Laterality Date  . Appendex  1962  . Video bronchoscopy  01/28/2012    Procedure: VIDEO BRONCHOSCOPY WITHOUT FLUORO;  Surgeon: Brand Males, MD;  Location: [facility] ENDOSCOPY;  Service: Endoscopy;  Laterality: Bilateral;  . Surgery on right wrist      has Dyspnea; COPD, moderate ([place]); Primary cancer of  right upper lobe of lung ([place]); Community acquired pneumonia; Chronic cough; Fatigue; COPD exacerbation ([place]); Encounter for antineoplastic immunotherapy; DNR (do not resuscitate); Sinus tachycardia ([place]); Palliative care encounter; Dysphagia; Chronic pain; Tachycardia; Encounter for antineoplastic chemotherapy; Port catheter in place; and Dehydration on her problem list.    is allergic to shellfish  allergy.    Medication List       This list is accurate as of: 05/07/16  3:34 PM.  Always use your most recent med list.               albuterol (2.5 MG/3ML) 0.083% nebulizer solution  Commonly known as:  PROVENTIL  Take 2.5 mg by nebulization every 8 (eight) hours.     albuterol 108 (90 Base) MCG/ACT inhaler  Commonly known as:  PROAIR HFA  Inhale 2 puffs into the lungs every 6 (six) hours as needed for wheezing or shortness of breath.     benzonatate 100 MG capsule  Commonly known as:  TESSALON  Take 100 mg by mouth every 8 (eight) hours as needed for cough.     budesonide 0.25 MG/2ML nebulizer solution  Commonly known as:  PULMICORT  Take 2 mLs (0.25 mg total) by nebulization 2 (two) times daily.     calcium-vitamin D 500-200 MG-UNIT tablet  Commonly known as:  OSCAL WITH D  Take 1 tablet by mouth daily.     CARDIZEM CD 180 MG 24 hr capsule  Generic drug:  diltiazem  Take 180 mg by mouth daily.     folic acid 570 MCG tablet  Commonly known as:  FOLVITE  Take 400 mcg by mouth daily.     furosemide 20 MG tablet  Commonly known as:  LASIX  Take 20 mg by mouth daily as needed.     guaiFENesin 600 MG 12 hr tablet  Commonly known as:  MUCINEX  Take 1 tablet (600 mg total) by mouth 2 (two) times daily.     HYDROcodone-acetaminophen 5-325 MG tablet  Commonly known as:  NORCO/VICODIN  Take 1 tablet by mouth every 4 (four) hours as needed for moderate pain.     ipratropium 0.02 % nebulizer solution  Commonly known as:  ATROVENT  Take 0.5 mg by nebulization every 8 (eight) hours.     magnesium oxide 400 (241.3 Mg) MG tablet  Commonly known as:  MAG-OX  Take 1 tablet (400 mg total) by mouth 2 (two) times daily.     morphine 15 MG 12 hr tablet  Commonly known as:  MS CONTIN  Take 1 tablet (15 mg total) by mouth every 12 (twelve) hours.     ondansetron 8 MG disintegrating tablet  Commonly known as:  ZOFRAN-ODT  DISSOLVE 1 TABLET BY MOUTH EVERY 8 HOURS AS NEEDED  FOR NAUSEA AND VOMITING     OXYGEN  Inhale 2 L into the lungs continuous.     pantoprazole 40 MG tablet  Commonly known as:  PROTONIX  Take 40 mg by mouth daily as needed (acid reflux).     polyethylene glycol packet  Commonly known as:  MIRALAX / GLYCOLAX  MX AND DRK 1 PACKET PO QD MIXED WITH 8 OUNCES OF FLUID     predniSONE 5 MG tablet  Commonly known as:  DELTASONE  Take 5 mg by mouth daily.     simvastatin 5 MG tablet  Commonly known as:  ZOCOR  Take 10 mg by mouth at bedtime.         PHYSICAL EXAMINATION  Oncology Vitals 05/07/2016 05/04/2016  Height 154 cm -  Weight 49.805 kg -  Weight (lbs) 109 lbs 13 oz -  BMI (kg/m2) 21.09 kg/m2 -  Temp 98.8 98  Pulse 117 97  Resp 20 20  SpO2 100 100  BSA (m2) 1.46 m2 -   BP Readings from Last 2 Encounters:  05/07/16 118/56  05/04/16 139/67    Physical Exam  Constitutional: She is oriented to person, place, and time. She appears malnourished and dehydrated. She appears unhealthy. She appears cachectic.  HENT:  Head: Normocephalic and atraumatic.  Eyes: Conjunctivae and EOM are normal. Pupils are equal, round, and reactive to light. Right eye exhibits no discharge. Left eye exhibits no discharge. No scleral icterus.  Neck: Normal range of motion. Neck supple. No JVD present. No tracheal deviation present. No thyromegaly present.  , No masses palpated in the neck.  Patient managing all oral secretions well.  Cardiovascular: Normal heart sounds and intact distal pulses.   Tachycardia  Pulmonary/Chest: No stridor. No respiratory distress. She has wheezes. She has rales. She exhibits no tenderness.  Patient with rhonchi and wheezes bilaterally.  Somewhat improved after albuterol nebulizer treatment.  Patient continues with increased effort of work of breathing.  O2 sat remained at 100% on 2 L.  Abdominal: Soft. Bowel sounds are normal. She exhibits no distension and no mass. There is no tenderness. There is no rebound and no  guarding.  Musculoskeletal: Normal range of motion. She exhibits no edema or tenderness.  Lymphadenopathy:    She has no cervical adenopathy.  Neurological: She is alert and oriented to person, place, and time.  Skin: Skin is warm and dry. No rash noted. No erythema.  Psychiatric: Affect normal.  Nursing note and vitals reviewed.   LABORATORY DATA:. Appointment on 05/07/2016  Component Date Value Ref Range Status  . WBC 05/07/2016 16.4* 3.9 - 10.3 10e3/uL Final  . NEUT# 05/07/2016 14.5* 1.5 - 6.5 10e3/uL Final  . HGB 05/07/2016 10.1* 11.6 - 15.9 g/dL Final  . HCT 05/07/2016 31.9* 34.8 - 46.6 % Final  . Platelets 05/07/2016 272  145 - 400 10e3/uL Final  . MCV 05/07/2016 84.8  79.5 - 101.0 fL Final  . MCH 05/07/2016 26.9  25.1 - 34.0 pg Final  . MCHC 05/07/2016 31.7  31.5 - 36.0 g/dL Final  . RBC 05/07/2016 3.76  3.70 - 5.45 10e6/uL Final  . RDW 05/07/2016 18.9* 11.2 - 14.5 % Final  . lymph# 05/07/2016 1.3  0.9 - 3.3 10e3/uL Final  . MONO# 05/07/2016 0.5  0.1 - 0.9 10e3/uL Final  . Eosinophils Absolute 05/07/2016 0.1  0.0 - 0.5 10e3/uL Final  . Basophils Absolute 05/07/2016 0.0  0.0 - 0.1 10e3/uL Final  . NEUT% 05/07/2016 88.5* 38.4 - 76.8 % Final  . LYMPH% 05/07/2016 7.7* 14.0 - 49.7 % Final  . MONO% 05/07/2016 3.0  0.0 - 14.0 % Final  . EOS% 05/07/2016 0.6  0.0 - 7.0 % Final  . BASO% 05/07/2016 0.2  0.0 - 2.0 % Final  . Sodium 05/07/2016 139  136 - 145 mEq/L Final  . Potassium 05/07/2016 3.8  3.5 - 5.1 mEq/L Final  . Chloride 05/07/2016 99  98 - 109 mEq/L Final  . CO2 05/07/2016 30* 22 - 29 mEq/L Final  . Glucose 05/07/2016 181* 70 - 140 mg/dl Final   Glucose reference range is for nonfasting patients. Fasting glucose reference range is 70- 100.  [place] Kitchen BUN 05/07/2016 14.0  7.0 - 26.0 mg/dL Final  .  Creatinine 05/07/2016 0.8  0.6 - 1.1 mg/dL Final  . Total Bilirubin 05/07/2016 0.92  0.20 - 1.20 mg/dL Final  . Alkaline Phosphatase 05/07/2016 108  40 - 150 U/L Final  . AST  05/07/2016 12  5 - 34 U/L Final  . ALT 05/07/2016 11  0 - 55 U/L Final  . Total Protein 05/07/2016 6.6  6.4 - 8.3 g/dL Final  . Albumin 05/07/2016 3.3* 3.5 - 5.0 g/dL Final  . Calcium 05/07/2016 9.5  8.4 - 10.4 mg/dL Final  . Anion Gap 05/07/2016 10  3 - 11 mEq/L Final  . EGFR 05/07/2016 83* >90 ml/min/1.73 m2 Final   eGFR is calculated using the CKD-EPI Creatinine Equation (2009)    RADIOGRAPHIC STUDIES: No results found.  ASSESSMENT/PLAN:    Primary cancer of right upper lobe of lung [facility]) Patient received cycle 5 of her Taxotere/Cyramza chemotherapy regimen on 05/02/2016.  She received Neulasta for growth factor support following her chemotherapy.  Patient presented to the [facility] today with complaints of dysphagia.  She states that any food or drink that she attempts becomes lodged in her throat and she ends up vomiting it back up.  She also feels increasingly short of breath as well.  She denies any chest pain, chest pressure, or pain with inspiration.  She feels dehydrated today.  See further notes for details of today's visit.  Patient is scheduled for labs on 05/09/2016 and 05/16/2016.  She is scheduled for labs, flush, visit, and chemotherapy on 05/23/2016.  Note: Per Dr. Julien Nordmann-.  Patient has refused hospice in the past.  Patient has indication of no code blue in her chart.  This provider was unable to find the actual documented in patient's chart.  Dyspnea Patient states that she typically has some very mild shortness of breath; and remains on 2 L O2 via nasal cannula on a 24/7 basis.  However, patient states that the past few days she has been experiencing dysphagia and intermittent nausea/vomiting when she attempts to drink or eat.  She has had little oral intake recently; and feels dehydrated.  She also feels increasingly short of breath as well.  Patient denies any chest pain, chest pressure, or pain with inspiration.  She also denies any recent fevers or  chills.  Exam today revealed.  Patient on 2 L via nasal cannula.  O2 sat was 100%.  However, patient was noted to have rhonchi and wheezes to all lung fields.  She also appeared short of breath and had increased work of breathing as well.  Patient received an albuterol nebulizer treatment while in the cancer Center.  Dr. Julien Nordmann in to examine patient as well; and he confirmed that this is not patient's typical baseline.    Patient will be transported to the emergency department for further evaluation and management today.  Brief history and report were called to the emergency department charge nurse; prior to the patient being transported to the emergency department via wheelchair.  Per the cancer Center nurse.  Documentation in patient's chart notes that patient is considered a no CODE BLUE; there is no actual living will document noted in the chart.  Patient has refused hospice in the past.  Dysphagia Patient states that she typically has some very mild shortness of breath; and remains on 2 L O2 via nasal cannula on a 24/7 basis.  However, patient states that the past few days she has been experiencing dysphagia and intermittent nausea/vomiting when she attempts to drink or eat.  She has had little oral intake recently; and feels dehydrated.  She also feels increasingly short of breath as well.  Patient denies any chest pain, chest pressure, or pain with inspiration.  She also denies any recent fevers or chills.  Exam today revealed.  Patient on 2 L via nasal cannula.  O2 sat was 100%.  However, patient was noted to have rhonchi and wheezes to all lung fields.  She also appeared short of breath and had increased work of breathing as well.  Patient received an albuterol nebulizer treatment while in the cancer Center.  Dr. Julien Nordmann in to examine patient as well; and he confirmed that this is not patient's typical baseline.  Patient's last restaging scan obtained on 04/16/2016 revealed that the right  paratracheal node decreased in size.  However, there is concern that there may be a partial obstruction in that region at this time.  Should consider further imaging with a possible CT neck for a CT angio chest for further evaluation.  Patient received Percocet 350 ML's normal saline IV fluid rehydration while cancer Center today.  Patient will be transported to the emergency department for further evaluation and management today.  Brief history and report were called to the emergency department charge nurse; prior to the patient being transported to the emergency department via wheelchair.  Per the cancer Center nurse.  Documentation in patient's chart notes that patient is considered a no CODE BLUE; there is no actual living will document noted in the chart.  Patient has refused hospice in the past.  Dehydration Patient states she's been unable to drink or eat since this past weekend and feels dehydrated today.  Patient received approximate 350 mg was fluid rehydration while at the cancer Center today prior to being transported to the emergency department for further evaluation and management.   Patient stated understanding of all instructions; and was in agreement with this plan of care. The patient knows to call the clinic with any problems, questions or concerns.   Total time spent with patient was 25 minutes;  with greater than 75 percent of that time spent in face to face counseling regarding patient's symptoms,  and coordination of care and follow up.  Disclaimer:This dictation was prepared with Dragon/digital dictation along with [employer]. Any transcriptional errors that result from this process are unintentional.  Drue Second, NP 05/07/2016   ADDENDUM: Hematology/Oncology Attending: I had a face to face encounter with the patient. I recommended her care plan. This is a very pleasant 75 years old African-American female with metastatic non-small cell lung cancer,  squamous cell carcinoma status post several chemotherapy regimens and currently on treatment with docetaxel and Cyramza status post 5 cycles. She has been tolerating her treatment fairly well and the most recent CT scan of the chest showed significant improvement in her disease especially the right paratracheal lymph node that was causing dysphagia in the past. The patient presenting to the symptom management clinic today complaining of worsening dyspnea as well as difficulty swallowing. She received breathing treatment in the clinic and she felt a little bit better but she continues to have shortness of breath and swallowing and feeling something stuck in her throat. The patient denied having any significant chest pain. She has no nausea or vomiting. She has no fever or chills. I strongly recommended for the patient to go immediately to the emergency department for evaluation and consideration of CT scan of the neck as well as swallow study for further evaluation of  her condition. She agreed to the current plan. I will continue to monitor her closely if she is admitted to the hospital.  Disclaimer: This note was dictated with voice recognition software. Similar sounding words can inadvertently be transcribed and may be missed upon review. Eilleen Kempf., MD 05/07/2016

## 2016-05-07 NOTE — ED Notes (Signed)
Bed: RESB Expected date:  Expected time:  Means of arrival:  Comments: Cancer patient

## 2016-05-07 NOTE — Patient Instructions (Signed)

## 2016-05-07 NOTE — ED Notes (Signed)
Per [place] and pt, she took pills on Sunday night as normal. Started having bolus sensation in throat.  Vomiting.  Unable to keep liquids or crackers down since then.  Pt is a lung cancer patient.  Last treatment on 7/13.  Pt rec'd 315 ml IVF at cancer center.  Shortness of breath.  On 2l per [place].

## 2016-05-07 NOTE — Addendum Note (Signed)
Addended by: Lucile Crater on: 05/07/2016 09:48 AM   Modules accepted: Orders

## 2016-05-07 NOTE — Discharge Instructions (Signed)
Return for worsening symptoms, including difficulty breathing, vomiting and unable to keep down food/fluids, difficulty swallowing or any other symptoms concerning to you.  Barium Swallow A barium swallow is an X-ray exam that is used to evaluate the area at the back of the throat (pharynx) and the tube that carries food and liquid from the mouth to the stomach (esophagus). For this exam, you will swallow a white chalky liquid called barium. X-rays are done while the barium passes through the areas that are being checked. The barium shows up well on X-rays, making it easier for your health care provider to see possible problems. A barium swallow may be done to check for various problems, such as:  Ulcers.  Tumors.  Inflammation of the esophagus.  Hiatal hernia. This is a condition in which the upper part of the stomach slides into the lower chest.  Scarring.  Blockages.  Problems with the muscular wall of the pharynx and esophagus. Your health care provider may recommend this procedure to help make a diagnosis if you have any of these symptoms:  Difficulty swallowing.  Chest pain that is not related to the heart.  Gastroesophageal reflux, which is a backward flow of stomach contents into the esophagus.  Unexplained vomiting.  Severe indigestion. LET [facility] CARE PROVIDER KNOW ABOUT:  Any allergies you have, especially allergies to contrast materials.  All medicines you are taking, including vitamins, herbs, eye drops, creams, and over-the-counter medicines.  Any blood disorders you have.  Previous surgeries you have had.  Any medical conditions you may have.  If you are pregnant or you think that you may be pregnant. RISKS AND COMPLICATIONS Generally, this is a safe procedure. However, problems may occur, including:  Constipation.  Fecal impaction.  Exposure to radiation (a small amount).  Allergic reaction to the barium. This is rare. BEFORE THE  PROCEDURE  Follow your health care provider's instructions about eating or drinking restrictions.  Ask your health care provider about changing or stopping your regular medicines. This is especially important if you are taking diabetes medicines or blood thinners. PROCEDURE  You will be positioned on an X-ray table.  You will be asked to drink the liquid barium, which looks like a light-colored milkshake. You will probably drink the barium through a straw.  During the procedure, the X-ray table may be moved to a more upright angle. You may also be asked to shift your position on the table. This will allow your entire esophagus to be viewed.  The health care provider will watch the barium flow through your esophagus using a type of X-ray that allows images to be viewed on a monitor in a movie-like sequence (fluoroscopy). X-ray images will also be stored for later viewing. The procedure may vary among health care providers and hospitals. AFTER THE PROCEDURE  Return to your normal activities and your normal diet as directed by your health care provider.  Your stool (feces) may be white or gray for 2-3 days until all of the barium has passed out of your body in your stool. You may be given a laxative to take to help remove the barium from your body.  Your health care provider may recommend other things to help prevent constipation after this procedure, including:  Drinking enough fluid to keep your urine clear or pale yellow.  Eating foods that are high in fiber, such as fruits, vegetables, whole grains, and beans.  Call your health care provider if:  You have difficulty having bowel  movements, or you are not able to have a bowel movement or to pass gas.  You have belly (abdominal) pain or swelling of your abdomen.  You have a fever.  It is your responsibility to obtain your test results. Ask your health care provider or the department performing the test when and how you will get your  results.   This information is not intended to replace advice given to you by your health care provider. Make sure you discuss any questions you have with your health care provider.   Document Released: 02/18/2007 Document Revised: 10/28/2014 Document Reviewed: 07/19/2014 Elsevier Interactive Patient Education 2016 [place] Foreign Body, Adult A swallowed foreign body is an object that gets stuck in the tube that connects your throat to your stomach (esophagus) or in another part of your digestive tract. Foreign bodies may be swallowed by accident or on purpose. When you swallow an object, it passes into your esophagus. The narrowest place in your digestive system is where your esophagus meets your stomach. If the object can pass through that place, it will usually continue through the rest of your digestive system without causing problems. A foreign body that gets stuck may need to be removed. It is very important to tell your health care provider what you have swallowed. Certain swallowed items can be life-threatening. You may need emergency treatment. Dangerous swallowed foreign bodies include:  Objects that get stuck in your throat.  Objects that interfere with your breathing.  Sharp objects.  Harmful objects, such as batteries or illegal drugs. CAUSES The most common swallowed foreign body is food that will not pass through your esophagus to your stomach (food impaction). Foods that commonly become impacted include meats and hard vegetables, such as carrots and radishes. Other common swallowed foreign bodies include:  Pieces of bone from meats.  Toothpicks.  Dentures. RISK FACTORS You are more likely to have a swallowed foreign body if:  You wear dentures.  You have been drinking alcohol or taking drugs.  You have a mental health condition.  You have a narrowed or scarred area in your digestive tract. SYMPTOMS  Pain or pressure in your throat or  chest.  Not being able to swallow food or liquid.  Not being able to swallow your saliva.  Choking.  Hoarse voice.  Trouble breathing. DIAGNOSIS This condition may be diagnosed based on your symptoms and medical history. Your health care provider will do a physical exam to confirm the diagnosis and to find the object. Imaging studies may be done, including:  X-rays.  A CT scan. Some objects may not be seen on imaging studies. In those cases, an exam may be done using a long tubelike scope to look into your esophagus (endoscopy). The tube (endoscope) that is used for this exam may be stiff (rigid) or flexible, depending on where the foreign body is stuck. TREATMENT Usually, an object that has passed into your stomach but is not dangerous will pass out of your digestive system without treatment. If the swallowed object is not dangerous but it is stuck in your esophagus:  You may be given medicine to relax the muscles of your esophagus to allow the object to pass through.  Endoscopy may be done to find and remove the object if it does not pass with medicine. Your health care provider will put medical instruments through the endoscope to remove the object. You may need emergency treatment if:  The object is in your esophagus and is  causing you to inhale saliva into your lungs (aspirate).  The object is in your esophagus and is pressing on your airway. This makes it hard for you to breathe.  The object can damage your digestive tract. Some objects that can cause damage include batteries, magnets, sharp objects, and drugs. HOME CARE INSTRUCTIONS If the object in your digestive system is expected to pass:  Continue eating what you usually eat, unless your health care provider gives you different instructions.  Check your stool after every bowel movement to see if you have passed the object.  Contact your health care provider if the object has not passed after 3 days. If you had  endoscopic surgery to remove the foreign body:  Follow instructions from your health care provider about caring for yourself after the procedure. Keep all follow-up visits and repeat imaging tests as told by your health care provider. This is important. SEEK MEDICAL CARE IF:  You continue to have symptoms of a swallowed foreign body.  The object has not passed out of your body after 3 days. SEEK IMMEDIATE MEDICAL CARE IF:  You have a fever.  You have pain in your chest or your abdomen.  You cough up blood.  You have blood in your stool (feces) or your vomit.   This information is not intended to replace advice given to you by your health care provider. Make sure you discuss any questions you have with your health care provider.   Document Released: 03/27/2010 Document Revised: 06/28/2015 Document Reviewed: 01/04/2015 Elsevier Interactive Patient Education [employer].

## 2016-05-07 NOTE — ED Provider Notes (Signed)
Please see previous physicians note regarding patient's presenting history and physical, initial ED course, and associated medical decision making.  Presents after sensation of her pills stuck in her throat. Sensation passed after glucagon in ED and she has been tolerating PO. Will have outpatient GI follow-up with [place], pending XR barium swallow. Barium swallow unremarkable. Patient back at baseline. Discharged with GI follow-up. Strict return and follow-up instructions reviewed. She expressed understanding of all discharge instructions and felt comfortable with the plan of care.   Forde Dandy, MD 05/07/16 [phone]

## 2016-05-07 NOTE — Assessment & Plan Note (Signed)
Patient states that she typically has some very mild shortness of breath; and remains on 2 L O2 via nasal cannula on a 24/7 basis.  However, patient states that the past few days she has been experiencing dysphagia and intermittent nausea/vomiting when she attempts to drink or eat.  She has had little oral intake recently; and feels dehydrated.  She also feels increasingly short of breath as well.  Patient denies any chest pain, chest pressure, or pain with inspiration.  She also denies any recent fevers or chills.  Exam today revealed.  Patient on 2 L via nasal cannula.  O2 sat was 100%.  However, patient was noted to have rhonchi and wheezes to all lung fields.  She also appeared short of breath and had increased work of breathing as well.  Patient received an albuterol nebulizer treatment while in the cancer Center.  Dr. Julien Nordmann in to examine patient as well; and he confirmed that this is not patient's typical baseline.  Patient's last restaging scan obtained on 04/16/2016 revealed that the right paratracheal node decreased in size.  However, there is concern that there may be a partial obstruction in that region at this time.  Should consider further imaging with a possible CT neck for a CT angio chest for further evaluation.  Patient received Percocet 350 ML's normal saline IV fluid rehydration while cancer Center today.  Patient will be transported to the emergency department for further evaluation and management today.  Brief history and report were called to the emergency department charge nurse; prior to the patient being transported to the emergency department via wheelchair.  Per the cancer Center nurse.  Documentation in patient's chart notes that patient is considered a no CODE BLUE; there is no actual living will document noted in the chart.  Patient has refused hospice in the past.

## 2016-05-07 NOTE — ED Notes (Signed)
Patient transported to X-ray 

## 2016-05-07 NOTE — Assessment & Plan Note (Addendum)
Patient states that she typically has some very mild shortness of breath; and remains on 2 L O2 via nasal cannula on a 24/7 basis.  However, patient states that the past few days she has been experiencing dysphagia and intermittent nausea/vomiting when she attempts to drink or eat.  She has had little oral intake recently; and feels dehydrated.  She also feels increasingly short of breath as well.  Patient denies any chest pain, chest pressure, or pain with inspiration.  She also denies any recent fevers or chills.  Exam today revealed.  Patient on 2 L via nasal cannula.  O2 sat was 100%.  However, patient was noted to have rhonchi and wheezes to all lung fields.  She also appeared short of breath and had increased work of breathing as well.  Patient received an albuterol nebulizer treatment while in the cancer Center.  Dr. Julien Nordmann in to examine patient as well; and he confirmed that this is not patient's typical baseline.    Patient will be transported to the emergency department for further evaluation and management today.  Brief history and report were called to the emergency department charge nurse; prior to the patient being transported to the emergency department via wheelchair.  Per the cancer Center nurse.  Documentation in patient's chart notes that patient is considered a no CODE BLUE; there is no actual living will document noted in the chart.  Patient has refused hospice in the past.

## 2016-05-07 NOTE — Assessment & Plan Note (Signed)
Patient states she's been unable to drink or eat since this past weekend and feels dehydrated today.  Patient received approximate 350 mg was fluid rehydration while at the cancer Center today prior to being transported to the emergency department for further evaluation and management.

## 2016-05-07 NOTE — Telephone Encounter (Signed)
Pt called states " I cant get anything down, I'm having to crush my pain meds, I havent taken any of my other medicine since Sunday. I was throwing up over the weekend. It feels like something is stuck in my throat and i cant get anything to go around it." Pt states she was able to eat chicken noodle soup and kept it down. Tried n/v meds but they  Came back up Last chemo 7/13 Cyramza and Taxotere. 7/14 Neulasta Reviewed with MD POF to [facility]

## 2016-05-08 ENCOUNTER — Telehealth: Payer: Self-pay | Admitting: Internal Medicine

## 2016-05-08 NOTE — Telephone Encounter (Signed)
spoke w/ pt confirmed apt times, pt will get new sched when she comes in

## 2016-05-09 ENCOUNTER — Ambulatory Visit: Payer: Medicare Other

## 2016-05-09 ENCOUNTER — Other Ambulatory Visit: Payer: Medicare Other

## 2016-05-09 ENCOUNTER — Telehealth: Payer: Self-pay | Admitting: Internal Medicine

## 2016-05-09 ENCOUNTER — Other Ambulatory Visit (HOSPITAL_BASED_OUTPATIENT_CLINIC_OR_DEPARTMENT_OTHER): Payer: Medicare Other

## 2016-05-09 DIAGNOSIS — Z95828 Presence of other vascular implants and grafts: Secondary | ICD-10-CM

## 2016-05-09 DIAGNOSIS — C3411 Malignant neoplasm of upper lobe, right bronchus or lung: Secondary | ICD-10-CM | POA: Diagnosis present

## 2016-05-09 LAB — CBC WITH DIFFERENTIAL/PLATELET
BASO%: 0.4 % (ref 0.0–2.0)
Basophils Absolute: 0 10*3/uL (ref 0.0–0.1)
EOS%: 0.9 % (ref 0.0–7.0)
Eosinophils Absolute: 0.1 10*3/uL (ref 0.0–0.5)
HEMATOCRIT: 29.2 % — AB (ref 34.8–46.6)
HGB: 9.4 g/dL — ABNORMAL LOW (ref 11.6–15.9)
LYMPH#: 1.3 10*3/uL (ref 0.9–3.3)
LYMPH%: 15.5 % (ref 14.0–49.7)
MCH: 27.2 pg (ref 25.1–34.0)
MCHC: 32.3 g/dL (ref 31.5–36.0)
MCV: 84.2 fL (ref 79.5–101.0)
MONO#: 2.1 10*3/uL — ABNORMAL HIGH (ref 0.1–0.9)
MONO%: 24.9 % — ABNORMAL HIGH (ref 0.0–14.0)
NEUT#: 4.9 10*3/uL (ref 1.5–6.5)
NEUT%: 58.3 % (ref 38.4–76.8)
Platelets: 239 10*3/uL (ref 145–400)
RBC: 3.47 10*6/uL — AB (ref 3.70–5.45)
RDW: 18.1 % — ABNORMAL HIGH (ref 11.2–14.5)
WBC: 8.4 10*3/uL (ref 3.9–10.3)

## 2016-05-09 LAB — COMPREHENSIVE METABOLIC PANEL
ALBUMIN: 3.1 g/dL — AB (ref 3.5–5.0)
ALK PHOS: 91 U/L (ref 40–150)
ALT: <9 U/L (ref 0–55)
ANION GAP: 10 meq/L (ref 3–11)
AST: 10 U/L (ref 5–34)
BUN: 10.8 mg/dL (ref 7.0–26.0)
CALCIUM: 9.1 mg/dL (ref 8.4–10.4)
CHLORIDE: 102 meq/L (ref 98–109)
CO2: 29 mEq/L (ref 22–29)
Creatinine: 0.8 mg/dL (ref 0.6–1.1)
EGFR: 85 mL/min/{1.73_m2} — AB (ref >90)
Glucose: 86 mg/dl (ref 70–140)
POTASSIUM: 3.7 meq/L (ref 3.5–5.1)
Sodium: 141 mEq/L (ref 136–145)
Total Bilirubin: 0.69 mg/dL (ref 0.20–1.20)
Total Protein: 6.2 g/dL — ABNORMAL LOW (ref 6.4–8.3)

## 2016-05-09 MED ORDER — HEPARIN SOD (PORK) LOCK FLUSH 100 UNIT/ML IV SOLN
500.0000 [IU] | Freq: Once | INTRAVENOUS | Status: AC
  Administered 2016-05-09: 500 [IU] via INTRAVENOUS
  Filled 2016-05-09: qty 5

## 2016-05-09 MED ORDER — SODIUM CHLORIDE 0.9% FLUSH
10.0000 mL | INTRAVENOUS | Status: DC | PRN
  Administered 2016-05-09: 10 mL via INTRAVENOUS
  Filled 2016-05-09: qty 10

## 2016-05-09 NOTE — Telephone Encounter (Signed)
Gave pt copy of schedule

## 2016-05-16 ENCOUNTER — Ambulatory Visit: Payer: Medicare Other

## 2016-05-16 ENCOUNTER — Other Ambulatory Visit: Payer: Medicare Other

## 2016-05-16 ENCOUNTER — Other Ambulatory Visit: Payer: Self-pay | Admitting: *Deleted

## 2016-05-16 ENCOUNTER — Ambulatory Visit (HOSPITAL_BASED_OUTPATIENT_CLINIC_OR_DEPARTMENT_OTHER): Payer: Medicare Other | Admitting: Internal Medicine

## 2016-05-16 DIAGNOSIS — C3411 Malignant neoplasm of upper lobe, right bronchus or lung: Secondary | ICD-10-CM | POA: Diagnosis present

## 2016-05-16 DIAGNOSIS — Z95828 Presence of other vascular implants and grafts: Secondary | ICD-10-CM

## 2016-05-16 LAB — COMPREHENSIVE METABOLIC PANEL
ALK PHOS: 117 U/L (ref 40–150)
ALT: <9 U/L (ref 0–55)
AST: 10 U/L (ref 5–34)
Albumin: 2.7 g/dL — ABNORMAL LOW (ref 3.5–5.0)
Anion Gap: 13 mEq/L — ABNORMAL HIGH (ref 3–11)
BILIRUBIN TOTAL: 0.41 mg/dL (ref 0.20–1.20)
BUN: 15.9 mg/dL (ref 7.0–26.0)
CALCIUM: 9 mg/dL (ref 8.4–10.4)
CHLORIDE: 96 meq/L — AB (ref 98–109)
CO2: 27 mEq/L (ref 22–29)
Creatinine: 0.8 mg/dL (ref 0.6–1.1)
EGFR: 85 mL/min/{1.73_m2} — ABNORMAL LOW (ref >90)
GLUCOSE: 124 mg/dL (ref 70–140)
POTASSIUM: 3 meq/L — AB (ref 3.5–5.1)
SODIUM: 136 meq/L (ref 136–145)
Total Protein: 6.5 g/dL (ref 6.4–8.3)

## 2016-05-16 LAB — CBC WITH DIFFERENTIAL/PLATELET
BASO%: 0.3 % (ref 0.0–2.0)
BASOS ABS: 0.1 10*3/uL (ref 0.0–0.1)
EOS%: 0.6 % (ref 0.0–7.0)
Eosinophils Absolute: 0.1 10*3/uL (ref 0.0–0.5)
HEMATOCRIT: 27.7 % — AB (ref 34.8–46.6)
HGB: 9.2 g/dL — ABNORMAL LOW (ref 11.6–15.9)
LYMPH%: 9.4 % — AB (ref 14.0–49.7)
MCH: 26.7 pg (ref 25.1–34.0)
MCHC: 33.2 g/dL (ref 31.5–36.0)
MCV: 80.5 fL (ref 79.5–101.0)
MONO#: 1.4 10*3/uL — ABNORMAL HIGH (ref 0.1–0.9)
MONO%: 8.6 % (ref 0.0–14.0)
NEUT#: 13.1 10*3/uL — ABNORMAL HIGH (ref 1.5–6.5)
NEUT%: 81.1 % — AB (ref 38.4–76.8)
Platelets: 296 10*3/uL (ref 145–400)
RBC: 3.44 10*6/uL — AB (ref 3.70–5.45)
RDW: 16.6 % — ABNORMAL HIGH (ref 11.2–14.5)
WBC: 16.2 10*3/uL — ABNORMAL HIGH (ref 3.9–10.3)
lymph#: 1.5 10*3/uL (ref 0.9–3.3)

## 2016-05-16 MED ORDER — POTASSIUM CHLORIDE CRYS ER 20 MEQ PO TBCR: 20.0000 meq | EXTENDED_RELEASE_TABLET | Freq: Every day | ORAL | 0 refills | Status: AC

## 2016-05-16 MED ORDER — SODIUM CHLORIDE 0.9% FLUSH
10.0000 mL | INTRAVENOUS | Status: DC | PRN
  Administered 2016-05-16: 10 mL via INTRAVENOUS
  Filled 2016-05-16: qty 10

## 2016-05-16 MED ORDER — HEPARIN SOD (PORK) LOCK FLUSH 100 UNIT/ML IV SOLN
500.0000 [IU] | Freq: Once | INTRAVENOUS | Status: AC
Start: 2016-05-16 — End: 2016-05-16
  Administered 2016-05-16: 500 [IU] via INTRAVENOUS
  Filled 2016-05-16: qty 5

## 2016-05-16 NOTE — Progress Notes (Signed)
Call patient with the result and order K Dur 20 meq po qd X 7 days.

## 2016-05-17 ENCOUNTER — Encounter: Payer: Self-pay | Admitting: *Deleted

## 2016-05-20 ENCOUNTER — Telehealth: Payer: Self-pay | Admitting: Medical Oncology

## 2016-05-20 NOTE — Telephone Encounter (Signed)
Pt taking kdur.

## 2016-05-23 ENCOUNTER — Other Ambulatory Visit (HOSPITAL_BASED_OUTPATIENT_CLINIC_OR_DEPARTMENT_OTHER): Payer: Medicare Other

## 2016-05-23 ENCOUNTER — Ambulatory Visit (HOSPITAL_BASED_OUTPATIENT_CLINIC_OR_DEPARTMENT_OTHER): Payer: Medicare Other | Admitting: Internal Medicine

## 2016-05-23 ENCOUNTER — Ambulatory Visit: Payer: Medicare Other

## 2016-05-23 ENCOUNTER — Ambulatory Visit (HOSPITAL_BASED_OUTPATIENT_CLINIC_OR_DEPARTMENT_OTHER): Payer: Medicare Other

## 2016-05-23 ENCOUNTER — Telehealth: Payer: Self-pay | Admitting: Internal Medicine

## 2016-05-23 ENCOUNTER — Other Ambulatory Visit: Payer: Self-pay | Admitting: Medical Oncology

## 2016-05-23 ENCOUNTER — Other Ambulatory Visit: Payer: Self-pay

## 2016-05-23 ENCOUNTER — Encounter: Payer: Self-pay | Admitting: Internal Medicine

## 2016-05-23 ENCOUNTER — Other Ambulatory Visit: Payer: Medicare Other

## 2016-05-23 ENCOUNTER — Telehealth: Payer: Self-pay | Admitting: *Deleted

## 2016-05-23 VITALS — BP 142/85 | HR 116 | Resp 20

## 2016-05-23 VITALS — BP 134/65 | HR 137 | Temp 97.5°F | Resp 17 | Ht 60.5 in | Wt 115.0 lb

## 2016-05-23 DIAGNOSIS — Z5111 Encounter for antineoplastic chemotherapy: Secondary | ICD-10-CM

## 2016-05-23 DIAGNOSIS — R05 Cough: Secondary | ICD-10-CM

## 2016-05-23 DIAGNOSIS — C3411 Malignant neoplasm of upper lobe, right bronchus or lung: Secondary | ICD-10-CM | POA: Diagnosis not present

## 2016-05-23 DIAGNOSIS — R000 Tachycardia, unspecified: Secondary | ICD-10-CM

## 2016-05-23 DIAGNOSIS — Z5112 Encounter for antineoplastic immunotherapy: Secondary | ICD-10-CM

## 2016-05-23 DIAGNOSIS — C3490 Malignant neoplasm of unspecified part of unspecified bronchus or lung: Secondary | ICD-10-CM

## 2016-05-23 DIAGNOSIS — R52 Pain, unspecified: Secondary | ICD-10-CM

## 2016-05-23 DIAGNOSIS — Z95828 Presence of other vascular implants and grafts: Secondary | ICD-10-CM

## 2016-05-23 LAB — CBC WITH DIFFERENTIAL/PLATELET
BASO%: 0.5 % (ref 0.0–2.0)
Basophils Absolute: 0 10*3/uL (ref 0.0–0.1)
EOS ABS: 0 10*3/uL (ref 0.0–0.5)
EOS%: 0 % (ref 0.0–7.0)
HCT: 29.3 % — ABNORMAL LOW (ref 34.8–46.6)
HGB: 9.5 g/dL — ABNORMAL LOW (ref 11.6–15.9)
LYMPH%: 4.1 % — AB (ref 14.0–49.7)
MCH: 26.4 pg (ref 25.1–34.0)
MCHC: 32.3 g/dL (ref 31.5–36.0)
MCV: 81.7 fL (ref 79.5–101.0)
MONO#: 0.1 10*3/uL (ref 0.1–0.9)
MONO%: 0.8 % (ref 0.0–14.0)
NEUT%: 94.6 % — AB (ref 38.4–76.8)
NEUTROS ABS: 10 10*3/uL — AB (ref 1.5–6.5)
PLATELETS: 789 10*3/uL — AB (ref 145–400)
RBC: 3.58 10*6/uL — AB (ref 3.70–5.45)
RDW: 19 % — ABNORMAL HIGH (ref 11.2–14.5)
WBC: 10.6 10*3/uL — AB (ref 3.9–10.3)
lymph#: 0.4 10*3/uL — ABNORMAL LOW (ref 0.9–3.3)

## 2016-05-23 LAB — COMPREHENSIVE METABOLIC PANEL
ALT: <9 U/L (ref 0–55)
ANION GAP: 13 meq/L — AB (ref 3–11)
AST: 9 U/L (ref 5–34)
Albumin: 2.4 g/dL — ABNORMAL LOW (ref 3.5–5.0)
Alkaline Phosphatase: 87 U/L (ref 40–150)
BUN: 10.1 mg/dL (ref 7.0–26.0)
CHLORIDE: 97 meq/L — AB (ref 98–109)
CO2: 25 meq/L (ref 22–29)
Calcium: 9.4 mg/dL (ref 8.4–10.4)
Creatinine: 0.8 mg/dL (ref 0.6–1.1)
EGFR: 83 mL/min/{1.73_m2} — AB (ref >90)
GLUCOSE: 295 mg/dL — AB (ref 70–140)
POTASSIUM: 4.4 meq/L (ref 3.5–5.1)
SODIUM: 135 meq/L — AB (ref 136–145)
TOTAL PROTEIN: 6.8 g/dL (ref 6.4–8.3)
Total Bilirubin: 0.47 mg/dL (ref 0.20–1.20)

## 2016-05-23 LAB — UA PROTEIN, DIPSTICK - CHCC: Protein, ur: <30 mg/dL

## 2016-05-23 MED ORDER — HYDROCODONE-ACETAMINOPHEN 5-325 MG PO TABS: 1.0000 | ORAL_TABLET | ORAL | 0 refills | Status: DC | PRN

## 2016-05-23 MED ORDER — ACETAMINOPHEN 325 MG PO TABS
ORAL_TABLET | ORAL | Status: AC
  Filled 2016-05-23: qty 2

## 2016-05-23 MED ORDER — SODIUM CHLORIDE 0.9 % IJ SOLN
10.0000 mL | INTRAMUSCULAR | Status: DC | PRN
  Administered 2016-05-23: 10 mL via INTRAVENOUS
  Filled 2016-05-23: qty 10

## 2016-05-23 MED ORDER — SODIUM CHLORIDE 0.9 % IV SOLN
10.0000 mg | Freq: Once | INTRAVENOUS | Status: AC
  Administered 2016-05-23: 10 mg via INTRAVENOUS
  Filled 2016-05-23: qty 1

## 2016-05-23 MED ORDER — HEPARIN SOD (PORK) LOCK FLUSH 100 UNIT/ML IV SOLN
500.0000 [IU] | Freq: Once | INTRAVENOUS | Status: AC | PRN
  Administered 2016-05-23: 500 [IU]
  Filled 2016-05-23: qty 5

## 2016-05-23 MED ORDER — ACETAMINOPHEN 325 MG PO TABS
650.0000 mg | ORAL_TABLET | Freq: Once | ORAL | Status: AC
  Administered 2016-05-23: 650 mg via ORAL

## 2016-05-23 MED ORDER — MORPHINE SULFATE ER 15 MG PO TBCR: 15.0000 mg | EXTENDED_RELEASE_TABLET | Freq: Two times a day (BID) | ORAL | 0 refills | Status: DC

## 2016-05-23 MED ORDER — SODIUM CHLORIDE 0.9 % IV SOLN
10.0000 mg/kg | Freq: Once | INTRAVENOUS | Status: AC
  Administered 2016-05-23: 500 mg via INTRAVENOUS
  Filled 2016-05-23: qty 50

## 2016-05-23 MED ORDER — DIPHENHYDRAMINE HCL 50 MG/ML IJ SOLN
50.0000 mg | Freq: Once | INTRAMUSCULAR | Status: AC
  Administered 2016-05-23: 50 mg via INTRAVENOUS

## 2016-05-23 MED ORDER — SODIUM CHLORIDE 0.9 % IV SOLN: 10.0000 mg/kg | Freq: Once | INTRAVENOUS | Status: DC

## 2016-05-23 MED ORDER — DIPHENHYDRAMINE HCL 50 MG/ML IJ SOLN
INTRAMUSCULAR | Status: AC
  Filled 2016-05-23: qty 1

## 2016-05-23 MED ORDER — SODIUM CHLORIDE 0.9 % IV SOLN
Freq: Once | INTRAVENOUS | Status: AC
  Administered 2016-05-23: 12:00:00 via INTRAVENOUS

## 2016-05-23 MED ORDER — SODIUM CHLORIDE 0.9 % IV SOLN
60.0000 mg/m2 | Freq: Once | INTRAVENOUS | Status: AC
  Administered 2016-05-23: 100 mg via INTRAVENOUS
  Filled 2016-05-23: qty 10

## 2016-05-23 MED ORDER — SODIUM CHLORIDE 0.9% FLUSH
10.0000 mL | INTRAVENOUS | Status: DC | PRN
  Administered 2016-05-23: 10 mL
  Filled 2016-05-23: qty 10

## 2016-05-23 MED ORDER — SODIUM CHLORIDE 0.9 % IV SOLN
500.0000 mL | Freq: Once | INTRAVENOUS | Status: DC
  Administered 2016-05-23: 500 mL via INTRAVENOUS

## 2016-05-23 NOTE — Patient Instructions (Signed)

## 2016-05-23 NOTE — Telephone Encounter (Signed)
Gave pt cal & avs °

## 2016-05-23 NOTE — Telephone Encounter (Signed)
Per staff message and POF I have scheduled appts. Advised scheduler of appts. JMW  

## 2016-05-23 NOTE — Progress Notes (Signed)
[place] [phone]   Fax:([phone]) [place], [place], [address] [place] [address]  DIAGNOSIS: Metastatic non-small cell lung cancer, squamous cell carcinoma diagnosed in March of 2013.   PRIOR THERAPY:  1. Status post palliative radiotherapy to the left lung mass under the care of Dr. Pablo Ledger completed on 03/06/2012.  2. Systemic chemotherapy with carboplatin for AUC of 6 on day 1 and Abraxane 100 mg/M2 on days 1, 8 and 15 every 3 weeks. Status post 2 cycles. From cycle 3 forward AUC will be decreased to 4.5 given on day 1 and the Abraxane will be decreased to 90 mg per meter squared on days 1, 8 and 15 every 3 weeks, Status post a total of 3 cycles.  3. Systemic chemotherapy with carboplatin for AUC of 5 on day 1 and gemcitabine 1000 mg/m2 given on day 1 and day 8 every 3 weeks,status post 1 cycle. Due to significant neutropenia she will be dosed reduced beginning cycle 2 forward to carboplatin at an AUC of 4 given on day 1 and gemcitabine at 800 mg per meter squared given on days 1 and 8 every 3 weeks. Status post 6 cycles.  4. Status post palliative radiotherapy to the right hilum under the care of Dr. Pablo Ledger completed on 11/26/2013. 5. Immunotherapy with Nivolumab 240 MG every 2 weeks. First dose expected 03/07/2015. Status post 20 cycles, last dose was given 01/18/2016 discontinued secondary to disease progression.  CURRENT THERAPY: Systemic chemotherapy with docetaxel 60 MG/M2 and Cyramza 10 mg/KG every 3 weeks. First dose 02/08/2016. Status post 5 cycles.  CHEMOTHERAPY INTENT: Palliative  CURRENT # OF CHEMOTHERAPY CYCLES: 6  CURRENT ANTIEMETICS: Compazine  CURRENT SMOKING STATUS: Former smoker  ORAL CHEMOTHERAPY AND CONSENT: None  CURRENT BISPHOSPHONATES USE: None  PAIN MANAGEMENT: 5/10 right shoulder currently on Norco  NARCOTICS INDUCED CONSTIPATION: None  LIVING WILL AND CODE  STATUS: No CODE BLUE   INTERVAL HISTORY: Destiny Harrison 75 y.o. female returns to the clinic today for followup visit accompanied by her son. The patient is feeling fine today except for the persistent shortness of breath and tachycardia. 2 weeks ago she had swallowing difficulty and felt a lump in her throat. She had CT of the neck as well as a swallow study performed at that time that showed no concerning findings. The patient felt much better over the last 2 weeks. She is still on home oxygen. She is tolerating her current treatment with docetaxel and Cyramza fairly well with no significant complaints. She denied having any significant chest pain but continues to have cough with no hemoptysis. She has no significant weight loss or night sweats. The patient denied having any significant fever or chills, nausea or vomiting. The patient is here today to start cycle #6 of her treatment.  MEDICAL HISTORY: Past Medical History:  Diagnosis Date  . Asthma   . COPD (chronic obstructive pulmonary disease) ([place])   . DNR (do not resuscitate) 06/26/2015  . Dysphagia 12/07/2015  . Encounter for antineoplastic immunotherapy 05/22/2015  . Hiatal hernia   . History of chemotherapy   . History of radiation therapy 03/06/2012   left hilum  . History of radiation therapy 05/10/2013-05/31/2013   Left lung/ 33/75'@2'$ .25 per fraction x 15 fractions  . Hyperlipidemia   . Neutropenia, drug-induced ([place]) 05/05/2012  . Non-small cell lung cancer ([place]) dx'd 08/28/11   left lung  . Primary cancer of right upper  lobe of lung ([place]) 02/06/2012  . Radiation 11/15/13-11/26/13   Right hilum 30 Gy in 10 fractions  . Rheumatoid arthritis(714.0)     ALLERGIES:  is allergic to shellfish allergy.  MEDICATIONS:  Current Outpatient Prescriptions  Medication Sig Dispense Refill  . albuterol (PROAIR HFA) 108 (90 BASE) MCG/ACT inhaler Inhale 2 puffs into the lungs every 6 (six) hours as needed for wheezing or shortness of breath. 1 Inhaler 5    . benzonatate (TESSALON) 100 MG capsule Take 100 mg by mouth every 8 (eight) hours as needed for cough.   1  . budesonide (PULMICORT) 0.25 MG/2ML nebulizer solution Take 2 mLs (0.25 mg total) by nebulization 2 (two) times daily. 60 mL 12  . calcium-vitamin D (OSCAL WITH D) 500-200 MG-UNIT per tablet Take 1 tablet by mouth daily.    Marland Kitchen dexamethasone (DECADRON) 4 MG tablet Take 4-12 mg by mouth as directed. Instructions: Every 3 weeks: Take 2 tablets the day before chemo, Take 3 tablets the day of chemo, Take 3 tablets the day after chemo.  2  . diltiazem (DILACOR XR) 180 MG 24 hr capsule Take 180 mg by mouth every evening.     . folic acid (FOLVITE) 607 MCG tablet Take 400 mcg by mouth daily.    . furosemide (LASIX) 20 MG tablet Take 20 mg by mouth daily.   5  . guaiFENesin (MUCINEX) 600 MG 12 hr tablet Take 1 tablet (600 mg total) by mouth 2 (two) times daily. 30 tablet 1  . HYDROcodone-acetaminophen (NORCO/VICODIN) 5-325 MG tablet Take 1 tablet by mouth every 4 (four) hours as needed for moderate pain. 60 tablet 0  . magnesium oxide (MAG-OX) 400 (241.3 Mg) MG tablet Take 1 tablet (400 mg total) by mouth 2 (two) times daily. 60 tablet 0  . metoCLOPramide (REGLAN) 10 MG tablet Take 10 mg by mouth every 6 (six) hours as needed for nausea or vomiting.   0  . morphine (MS CONTIN) 15 MG 12 hr tablet Take 1 tablet (15 mg total) by mouth every 12 (twelve) hours. 60 tablet 0  . ondansetron (ZOFRAN-ODT) 8 MG disintegrating tablet DISSOLVE 1 TABLET BY MOUTH EVERY 8 HOURS AS NEEDED FOR NAUSEA AND VOMITING 20 tablet 0  . OXYGEN Inhale 2 L into the lungs continuous.    . polyethylene glycol (MIRALAX / GLYCOLAX) packet MX AND DRK 1 PACKET PO QD MIXED WITH 8 OUNCES OF FLUID  0  . potassium chloride SA (K-DUR,KLOR-CON) 20 MEQ tablet Take 1 tablet (20 mEq total) by mouth daily. 7 tablet 0  . predniSONE (DELTASONE) 5 MG tablet Take 5 mg by mouth daily.    . simvastatin (ZOCOR) 5 MG tablet Take 10 mg by mouth at  bedtime.     . pantoprazole (PROTONIX) 40 MG tablet Take 40 mg by mouth daily as needed (acid reflux).      No current facility-administered medications for this visit.     SURGICAL HISTORY:  Past Surgical History:  Procedure Laterality Date  . appendex  1962  . surgery on right wrist    . VIDEO BRONCHOSCOPY  01/28/2012   Procedure: VIDEO BRONCHOSCOPY WITHOUT FLUORO;  Surgeon: Brand Males, MD;  Location: [facility] ENDOSCOPY;  Service: Endoscopy;  Laterality: Bilateral;    REVIEW OF SYSTEMS:  Constitutional: positive for fatigue Eyes: negative Ears, nose, mouth, throat, and face: negative Respiratory: positive for cough and dyspnea on exertion Cardiovascular: positive for palpitations Gastrointestinal: negative Genitourinary:negative Integument/breast: negative Hematologic/lymphatic: negative Musculoskeletal:negative Neurological: negative Behavioral/Psych: negative  Endocrine: negative Allergic/Immunologic: negative   PHYSICAL EXAMINATION: General appearance: alert, cooperative and no distress Head: Normocephalic, without obvious abnormality, atraumatic Neck: no adenopathy, no JVD, supple, symmetrical, trachea midline and thyroid not enlarged, symmetric, no tenderness/mass/nodules Lymph nodes: Cervical, supraclavicular, and axillary nodes normal. Resp: clear to auscultation bilaterally Back: symmetric, no curvature. ROM normal. No CVA tenderness. Cardio: regular rate and rhythm, S1, S2 normal, no murmur, click, rub or gallop GI: soft, non-tender; bowel sounds normal; no masses,  no organomegaly Extremities: extremities normal, atraumatic, no cyanosis or edema Neurologic: Alert and oriented X 3, normal strength and tone. Normal symmetric reflexes. Normal coordination and gait  ECOG PERFORMANCE STATUS: 1 - Symptomatic but completely ambulatory  Blood pressure 134/65, pulse (!) 137, temperature 97.5 F (36.4 C), temperature source Oral, resp. rate 17, height 5' 0.5" (1.537 m),  weight 115 lb (52.2 kg), SpO2 100 %.  LABORATORY DATA: Lab Results  Component Value Date   WBC 10.6 (H) 05/23/2016   HGB 9.5 (L) 05/23/2016   HCT 29.3 (L) 05/23/2016   MCV 81.7 05/23/2016   PLT 789 (H) 05/23/2016      Chemistry      Component Value Date/Time   NA 136 05/16/2016 1255   K 3.0 (LL) 05/16/2016 1255   CL 100 (L) 03/26/2016 1242   CL 101 01/12/2013 0810   CO2 27 05/16/2016 1255   BUN 15.9 05/16/2016 1255   CREATININE 0.8 05/16/2016 1255      Component Value Date/Time   CALCIUM 9.0 05/16/2016 1255   ALKPHOS 117 05/16/2016 1255   AST 10 05/16/2016 1255   ALT <9 05/16/2016 1255   BILITOT 0.41 05/16/2016 1255       RADIOGRAPHIC STUDIES: Dg Chest 2 View  Result Date: 05/07/2016 CLINICAL DATA:  Vomiting. Globus sensation Lung cancer. Query esophageal form body. EXAM: CHEST  2 VIEW COMPARISON:  04/16/2016 FINDINGS: Power injectable right Port-A-Cath tip: SVC. Atherosclerotic aortic arch. Right paramediastinal mass observed along the upper mediastinum. Emphysema is present. Left perihilar opacity likely firm radiation fibrosis. Right perihilar density, unchanged from 04/16/2016. Calcified AP window lymph node noted. Several upper to mid thoracic wedge compressions are noted. Scarring or atelectasis inferiorly in the right middle lobe. IMPRESSION: 1. Generally similar appearance of the chest to 6/20 7/17, including a right upper paramediastinal tumor, perihilar densities likely therapy related, scarring or atelectasis inferiorly in the right middle lobe coma and atherosclerosis. 2. Stable appearance of several upper to mid thoracic compression fractures. Electronically Signed   By: Van Clines M.D.   On: 05/07/2016 16:11   Ct Soft Tissue Neck W Contrast  Result Date: 05/07/2016 CLINICAL DATA:  Difficulty swallowing. Lung cancer. On chemotherapy. EXAM: CT NECK WITH CONTRAST TECHNIQUE: Multidetector CT imaging of the neck was performed using the standard protocol  following the bolus administration of intravenous contrast. CONTRAST:  34m ISOVUE-300 IOPAMIDOL (ISOVUE-300) INJECTION 61% COMPARISON:  MRI brain 02/18/2012, chest CT 04/16/2016 FINDINGS: Pharynx and larynx: The nasopharynx is clear. The oropharynx and hypopharynx are normal. The epiglottis is normal. The supraglottic larynx, glottis and subglottic larynx are normal. Salivary glands: The parotid and submandibular glands are normal. No sialolithiasis or salivary ductal dilatation. Thyroid: Bilateral subcentimeter hypodense thyroid nodules. Lymph nodes: There is no cervical adenopathy. There is a partially necrotic right paratracheal nodal mass measuring 4.1 x 3.3 cm, previously 3.7 x 3.9 cm. Vascular: There is a right IJ approach Port-A-Cath with tip terminating beyond the field of view. There is aortic atherosclerosis. There is mild atherosclerotic plaque  at both aortic bifurcations. The bilateral internal carotid arteries are widely patent. There is a diminutive right vertebral artery. Left vertebral artery is normal. Limited intracranial: Unremarkable Visualized orbits: Normal Mastoids and visualized paranasal sinuses: Clear Skeleton: Multilevel mild cervical spondylosis without advanced spinal canal stenosis. No discrete lytic or blastic osseous lesions. Unchanged mid thoracic compression fracture. Upper chest: Left perihilar radiation fibrosis is unchanged. No discrete pulmonary nodules or masses are identified. Findings of emphysema re- demonstrated. No visualized pleural effusion or pneumothorax. IMPRESSION: 1. No significant interval change in the size of right peritracheal partially necrotic lymphadenopathy. 2. No new findings to explain the reported difficulty swallowing. Normal appearance of the pharynx and larynx. 3. No cervical adenopathy or evidence metastatic disease to the neck. Electronically Signed   By: Ulyses Jarred M.D.   On: 05/07/2016 16:45   Dg Esophagus  Result Date: 05/07/2016 CLINICAL  DATA:  75 year old female with lung cancer on chemotherapy. Difficulty swallowing pills. Request for emergent swallow prior to endoscopy. EXAM: ESOPHOGRAM/BARIUM SWALLOW TECHNIQUE: Single contrast examination was performed using  thin barium. FLUOROSCOPY TIME:  Radiation Exposure Index (as provided by the fluoroscopic device): 28.5 micro Gray Fluoroscopy Time:  1 minutes and 40 seconds. COMPARISON:  Neck CT same date. FINDINGS: No laryngeal penetration or aspiration. No esophageal obstructing lesion. Poor primary esophageal stripping wave. Patient ingested a 13 mm barium tablet without difficulty. IMPRESSION: No esophageal obstructing lesion. Poor primary esophageal stripping wave (most notable with patient in supine position). Patient ingested a 13 mm barium tablet without difficulty. Electronically Signed   By: Genia Del M.D.   On: 05/07/2016 18:36   ASSESSMENT AND PLAN: This is a very pleasant 75 years old African American female with metastatic non-small cell lung cancer, squamous cell carcinoma status post several chemotherapy regimen and and completed a course of palliative radiotherapy to the right hilum in February 2015.  She completed a course of treatment with immunotherapy with Nivolumab status post 20 cycles. This was discontinued today secondary to disease progression with further enlargement of the large right paratracheal lymph node. I discussed the scan results and showed the images to the patient and her son. I recommended for her to discontinue her current treatment with Nivolumab at this point. She is currently on systemic chemotherapy with docetaxel 60 MG/M2 and Cyramza 10 MG/KG every 3 weeks with Neulasta support. Status post 5 cycles and tolerating her treatment well. I recommended for the patient to proceed with cycle #6 today as scheduled. For the tachycardia, I will check EKG today to rule out any cardiac abnormality. For pain management, she will continue on MS Contin in  addition to Vicodin. I will give her a refill of her medication. For the cough, she will continue on Tussinox. She was advised to call immediately if she has any concerning symptoms in the interval.  The patient voices understanding of current disease status and treatment options and is in agreement with the current care plan.  All questions were answered. The patient knows to call the clinic with any problems, questions or concerns. We can certainly see the patient much sooner if necessary.  Disclaimer: This note was dictated with voice recognition software. Similar sounding words can inadvertently be transcribed and may not be corrected upon review.

## 2016-05-23 NOTE — Progress Notes (Signed)
Okay to treat today with elevated heart rate of 137, will give 500 ml of normal saline with treatment, per Dr. Julien Nordmann.

## 2016-05-24 ENCOUNTER — Telehealth: Payer: Self-pay | Admitting: Internal Medicine

## 2016-05-24 NOTE — Telephone Encounter (Signed)
spoke w/ pt confirmed 8/24 apt times

## 2016-05-25 ENCOUNTER — Ambulatory Visit (HOSPITAL_BASED_OUTPATIENT_CLINIC_OR_DEPARTMENT_OTHER): Payer: Medicare Other

## 2016-05-25 VITALS — BP 142/67 | HR 125 | Temp 97.9°F | Resp 18

## 2016-05-25 DIAGNOSIS — C3411 Malignant neoplasm of upper lobe, right bronchus or lung: Secondary | ICD-10-CM

## 2016-05-25 MED ORDER — PEGFILGRASTIM INJECTION 6 MG/0.6ML ~~LOC~~
6.0000 mg | PREFILLED_SYRINGE | Freq: Once | SUBCUTANEOUS | Status: AC
  Administered 2016-05-25: 6 mg via SUBCUTANEOUS

## 2016-05-27 ENCOUNTER — Emergency Department (HOSPITAL_COMMUNITY)
Admission: EM | Admit: 2016-05-27 | Discharge: 2016-05-27 | Disposition: A | Discharge status: Home / Self Care | Payer: Medicare Other | Source: Home / Self Care | Attending: Emergency Medicine | Admitting: Emergency Medicine

## 2016-05-27 ENCOUNTER — Encounter (HOSPITAL_COMMUNITY): Payer: Self-pay | Admitting: *Deleted

## 2016-05-27 ENCOUNTER — Emergency Department (HOSPITAL_COMMUNITY): Payer: Medicare Other

## 2016-05-27 DIAGNOSIS — Z87891 Personal history of nicotine dependence: Secondary | ICD-10-CM

## 2016-05-27 DIAGNOSIS — Y95 Nosocomial condition: Secondary | ICD-10-CM | POA: Diagnosis present

## 2016-05-27 DIAGNOSIS — Z79899 Other long term (current) drug therapy: Secondary | ICD-10-CM | POA: Insufficient documentation

## 2016-05-27 DIAGNOSIS — Z9981 Dependence on supplemental oxygen: Secondary | ICD-10-CM

## 2016-05-27 DIAGNOSIS — Z79891 Long term (current) use of opiate analgesic: Secondary | ICD-10-CM

## 2016-05-27 DIAGNOSIS — J440 Chronic obstructive pulmonary disease with acute lower respiratory infection: Secondary | ICD-10-CM | POA: Diagnosis present

## 2016-05-27 DIAGNOSIS — A419 Sepsis, unspecified organism: Secondary | ICD-10-CM | POA: Diagnosis present

## 2016-05-27 DIAGNOSIS — E860 Dehydration: Secondary | ICD-10-CM

## 2016-05-27 DIAGNOSIS — R000 Tachycardia, unspecified: Secondary | ICD-10-CM

## 2016-05-27 DIAGNOSIS — T451X5A Adverse effect of antineoplastic and immunosuppressive drugs, initial encounter: Secondary | ICD-10-CM | POA: Diagnosis present

## 2016-05-27 DIAGNOSIS — M069 Rheumatoid arthritis, unspecified: Secondary | ICD-10-CM | POA: Diagnosis present

## 2016-05-27 DIAGNOSIS — J441 Chronic obstructive pulmonary disease with (acute) exacerbation: Secondary | ICD-10-CM | POA: Insufficient documentation

## 2016-05-27 DIAGNOSIS — J189 Pneumonia, unspecified organism: Secondary | ICD-10-CM | POA: Diagnosis present

## 2016-05-27 DIAGNOSIS — Z7952 Long term (current) use of systemic steroids: Secondary | ICD-10-CM

## 2016-05-27 DIAGNOSIS — J9610 Chronic respiratory failure, unspecified whether with hypoxia or hypercapnia: Secondary | ICD-10-CM | POA: Diagnosis present

## 2016-05-27 DIAGNOSIS — E43 Unspecified severe protein-calorie malnutrition: Secondary | ICD-10-CM | POA: Diagnosis present

## 2016-05-27 DIAGNOSIS — C3411 Malignant neoplasm of upper lobe, right bronchus or lung: Secondary | ICD-10-CM | POA: Insufficient documentation

## 2016-05-27 DIAGNOSIS — E785 Hyperlipidemia, unspecified: Secondary | ICD-10-CM | POA: Diagnosis present

## 2016-05-27 DIAGNOSIS — Z7951 Long term (current) use of inhaled steroids: Secondary | ICD-10-CM

## 2016-05-27 DIAGNOSIS — J45909 Unspecified asthma, uncomplicated: Secondary | ICD-10-CM

## 2016-05-27 DIAGNOSIS — I10 Essential (primary) hypertension: Secondary | ICD-10-CM | POA: Diagnosis present

## 2016-05-27 DIAGNOSIS — Z923 Personal history of irradiation: Secondary | ICD-10-CM

## 2016-05-27 DIAGNOSIS — K529 Noninfective gastroenteritis and colitis, unspecified: Principal | ICD-10-CM | POA: Diagnosis present

## 2016-05-27 DIAGNOSIS — Z833 Family history of diabetes mellitus: Secondary | ICD-10-CM

## 2016-05-27 DIAGNOSIS — E876 Hypokalemia: Secondary | ICD-10-CM | POA: Diagnosis present

## 2016-05-27 DIAGNOSIS — R0602 Shortness of breath: Secondary | ICD-10-CM | POA: Diagnosis not present

## 2016-05-27 DIAGNOSIS — C3410 Malignant neoplasm of upper lobe, unspecified bronchus or lung: Secondary | ICD-10-CM | POA: Diagnosis not present

## 2016-05-27 DIAGNOSIS — Z66 Do not resuscitate: Secondary | ICD-10-CM | POA: Diagnosis present

## 2016-05-27 DIAGNOSIS — Z803 Family history of malignant neoplasm of breast: Secondary | ICD-10-CM

## 2016-05-27 DIAGNOSIS — Z681 Body mass index (BMI) 19 or less, adult: Secondary | ICD-10-CM

## 2016-05-27 DIAGNOSIS — D899 Disorder involving the immune mechanism, unspecified: Secondary | ICD-10-CM | POA: Diagnosis present

## 2016-05-27 DIAGNOSIS — D6481 Anemia due to antineoplastic chemotherapy: Secondary | ICD-10-CM | POA: Diagnosis present

## 2016-05-27 DIAGNOSIS — R112 Nausea with vomiting, unspecified: Secondary | ICD-10-CM | POA: Diagnosis not present

## 2016-05-27 DIAGNOSIS — Z91013 Allergy to seafood: Secondary | ICD-10-CM

## 2016-05-27 LAB — COMPREHENSIVE METABOLIC PANEL
ALBUMIN: 2.9 g/dL — AB (ref 3.5–5.0)
ALK PHOS: 69 U/L (ref 38–126)
ALT: 12 U/L — ABNORMAL LOW (ref 14–54)
ANION GAP: 9 (ref 5–15)
AST: 14 U/L — ABNORMAL LOW (ref 15–41)
BUN: 15 mg/dL (ref 6–20)
CALCIUM: 8.1 mg/dL — AB (ref 8.9–10.3)
CO2: 28 mmol/L (ref 22–32)
Chloride: 102 mmol/L (ref 101–111)
Creatinine, Ser: 0.55 mg/dL (ref 0.44–1.00)
GFR calc non Af Amer: >60 mL/min (ref >60)
GLUCOSE: 86 mg/dL (ref 65–99)
POTASSIUM: 2.9 mmol/L — AB (ref 3.5–5.1)
SODIUM: 139 mmol/L (ref 135–145)
TOTAL PROTEIN: 5.8 g/dL — AB (ref 6.5–8.1)
Total Bilirubin: 0.8 mg/dL (ref 0.3–1.2)

## 2016-05-27 LAB — LIPASE, BLOOD: Lipase: 26 U/L (ref 11–51)

## 2016-05-27 LAB — CBC WITH DIFFERENTIAL/PLATELET
BASOS ABS: 0.3 10*3/uL — AB (ref 0.0–0.1)
BASOS PCT: 1 %
EOS ABS: 0 10*3/uL (ref 0.0–0.7)
Eosinophils Relative: 0 %
HCT: 26.2 % — ABNORMAL LOW (ref 36.0–46.0)
HEMOGLOBIN: 8.6 g/dL — AB (ref 12.0–15.0)
LYMPHS ABS: 0.9 10*3/uL (ref 0.7–4.0)
Lymphocytes Relative: 3 %
MCH: 26.8 pg (ref 26.0–34.0)
MCHC: 32.8 g/dL (ref 30.0–36.0)
MCV: 81.6 fL (ref 78.0–100.0)
MONO ABS: 0.3 10*3/uL (ref 0.1–1.0)
Monocytes Relative: 1 %
NEUTROS ABS: 27.2 10*3/uL — AB (ref 1.7–7.7)
Neutrophils Relative %: 95 %
Platelets: 505 10*3/uL — ABNORMAL HIGH (ref 150–400)
RBC: 3.21 MIL/uL — ABNORMAL LOW (ref 3.87–5.11)
RDW: 17.2 % — AB (ref 11.5–15.5)
WBC Morphology: INCREASED
WBC: 28.7 10*3/uL — ABNORMAL HIGH (ref 4.0–10.5)

## 2016-05-27 LAB — URINALYSIS, ROUTINE W REFLEX MICROSCOPIC
BILIRUBIN URINE: NEGATIVE
Glucose, UA: NEGATIVE mg/dL
Hgb urine dipstick: NEGATIVE
KETONES UR: NEGATIVE mg/dL
NITRITE: NEGATIVE
Protein, ur: NEGATIVE mg/dL
SPECIFIC GRAVITY, URINE: 1.011 (ref 1.005–1.030)
pH: 7 (ref 5.0–8.0)

## 2016-05-27 LAB — URINE MICROSCOPIC-ADD ON: RBC / HPF: NONE SEEN RBC/hpf (ref 0–5)

## 2016-05-27 MED ORDER — MORPHINE SULFATE (PF) 4 MG/ML IV SOLN
4.0000 mg | Freq: Once | INTRAVENOUS | Status: AC
  Administered 2016-05-27: 4 mg via INTRAVENOUS
  Filled 2016-05-27: qty 1

## 2016-05-27 MED ORDER — IOPAMIDOL (ISOVUE-300) INJECTION 61%: 100.0000 mL | Freq: Once | INTRAVENOUS | Status: DC | PRN

## 2016-05-27 MED ORDER — SODIUM CHLORIDE 0.9 % IV BOLUS (SEPSIS)
1000.0000 mL | Freq: Once | INTRAVENOUS | Status: AC
  Administered 2016-05-27: 1000 mL via INTRAVENOUS

## 2016-05-27 MED ORDER — DIPHENHYDRAMINE HCL 50 MG/ML IJ SOLN
12.5000 mg | Freq: Once | INTRAMUSCULAR | Status: AC
  Administered 2016-05-27: 12.5 mg via INTRAVENOUS
  Filled 2016-05-27: qty 1

## 2016-05-27 MED ORDER — HEPARIN SOD (PORK) LOCK FLUSH 100 UNIT/ML IV SOLN
500.0000 [IU] | Freq: Once | INTRAVENOUS | Status: AC
  Administered 2016-05-27: 500 [IU]
  Filled 2016-05-27: qty 5

## 2016-05-27 MED ORDER — DIATRIZOATE MEGLUMINE & SODIUM 66-10 % PO SOLN: 30.0000 mL | Freq: Once | ORAL | Status: DC

## 2016-05-27 MED ORDER — METOCLOPRAMIDE HCL 5 MG/ML IJ SOLN
5.0000 mg | Freq: Once | INTRAMUSCULAR | Status: AC
  Administered 2016-05-27: 5 mg via INTRAVENOUS
  Filled 2016-05-27: qty 2

## 2016-05-27 MED ORDER — SODIUM CHLORIDE 0.9 % IV SOLN
INTRAVENOUS | Status: DC
  Administered 2016-05-27: 15:00:00 via INTRAVENOUS

## 2016-05-27 NOTE — ED Triage Notes (Addendum)
Patient presents with N/V since yesterday.  Patient states she is unable to tolerate oral intake.  Patient is undergoing chemotherapy for lung cancer, last tx was July 3.  Patient states she has been evaluated for N/V 3-4 times in ED and has rx for Zofran at home.  It is not helping with nausea.  Patient is taking hydrocodone and morphine P.O. For pain at home, but has been unable to keep it down.

## 2016-05-27 NOTE — ED Notes (Signed)
Pt wheeled to vehicle.  Ambulatory and independent at discharge.  Verbalized understanding of discharge instructions.

## 2016-05-27 NOTE — ED Notes (Signed)
Patient states she would prefer if we could just access her port instead of being stuck multiple times.

## 2016-05-27 NOTE — ED Notes (Signed)
Pt refused CT scan with wishes to be discharged. MD aware.

## 2016-05-27 NOTE — ED Provider Notes (Signed)
[facility] DEPT Provider Note   CSN: [id number] Arrival date & time: 05/27/16  1250  First Provider Contact:  First MD Initiated Contact with Patient 05/27/16 1421        History   Chief Complaint Chief Complaint  Patient presents with  . Emesis    HPI Matricia Begnaud is a 75 y.o. female.  75 year old female presents with vomiting which is nonbilious as well as diarrhea. Currently being treated with chemotherapy for stage IV lung cancer and last chemotherapy treatment was a week ago. Denies any fever or chills. No abdominal pain. Has been able to keep down her home narcotic medication for her chronic chest discomfort. Denies any dyspnea. Notes increased heart rate as well as weakness. Attempted to use her home Zofran without relief.   The history is provided by the patient.    Past Medical History:  Diagnosis Date  . Asthma   . COPD (chronic obstructive pulmonary disease) ([place])   . DNR (do not resuscitate) 06/26/2015  . Dysphagia 12/07/2015  . Encounter for antineoplastic immunotherapy 05/22/2015  . Hiatal hernia   . History of chemotherapy   . History of radiation therapy 03/06/2012   left hilum  . History of radiation therapy 05/10/2013-05/31/2013   Left lung/ 33/75'@2'$ .25 per fraction x 15 fractions  . Hyperlipidemia   . Neutropenia, drug-induced ([place]) 05/05/2012  . Non-small cell lung cancer ([place]) dx'd 08/28/11   left lung  . Primary cancer of right upper lobe of lung ([place]) 02/06/2012  . Radiation 11/15/13-11/26/13   Right hilum 30 Gy in 10 fractions  . Rheumatoid arthritis(714.0)     Patient Active Problem List   Diagnosis Date Noted  . Port catheter in place 05/07/2016  . Dehydration 05/07/2016  . Encounter for antineoplastic chemotherapy 04/11/2016  . Tachycardia 02/12/2016  . Chronic pain 12/21/2015  . Dysphagia 12/07/2015  . Palliative care encounter   . DNR (do not resuscitate) 06/26/2015  . Sinus tachycardia ([place]) 06/26/2015  . Encounter for antineoplastic  immunotherapy 05/22/2015  . COPD exacerbation ([place]) 04/22/2015  . Fatigue 02/28/2015  . Chronic cough 02/06/2014  . Community acquired pneumonia 10/09/2012  . Primary cancer of right upper lobe of lung ([place]) 02/06/2012  . COPD, moderate ([place]) 11/18/2011  . Dyspnea 11/04/2011    Past Surgical History:  Procedure Laterality Date  . appendex  1962  . surgery on right wrist    . VIDEO BRONCHOSCOPY  01/28/2012   Procedure: VIDEO BRONCHOSCOPY WITHOUT FLUORO;  Surgeon: Brand Males, MD;  Location: [facility] ENDOSCOPY;  Service: Endoscopy;  Laterality: Bilateral;    OB History    No data available       Home Medications    Prior to Admission medications   Medication Sig Start Date End Date Taking? Authorizing Provider  albuterol (PROAIR HFA) 108 (90 BASE) MCG/ACT inhaler Inhale 2 puffs into the lungs every 6 (six) hours as needed for wheezing or shortness of breath. 08/31/15  Yes Brand Males, MD  benzonatate (TESSALON) 100 MG capsule Take 100 mg by mouth every 8 (eight) hours as needed for cough.  01/05/16  Yes Historical Provider, MD  budesonide (PULMICORT) 0.25 MG/2ML nebulizer solution Take 2 mLs (0.25 mg total) by nebulization 2 (two) times daily. Patient taking differently: Take 0.25 mg by nebulization 3 (three) times daily.  08/20/15  Yes Belkys A Regalado, MD  calcium-vitamin D (OSCAL WITH D) 500-200 MG-UNIT per tablet Take 1 tablet by mouth daily.   Yes Historical Provider, MD  Cholecalciferol (VITAMIN D PO)  Take 1 tablet by mouth daily.   Yes Historical Provider, MD  dexamethasone (DECADRON) 4 MG tablet Take 4-12 mg by mouth as directed. Instructions: Every 3 weeks: Take 2 tablets the day before chemo, Take 3 tablets the day of chemo, Take 3 tablets the day after chemo. 04/09/16  Yes Historical Provider, MD  folic acid (FOLVITE) 606 MCG tablet Take 400 mcg by mouth daily.   Yes Historical Provider, MD  furosemide (LASIX) 20 MG tablet Take 20 mg by mouth daily.  04/30/16  Yes  Historical Provider, MD  guaiFENesin (MUCINEX) 600 MG 12 hr tablet Take 1 tablet (600 mg total) by mouth 2 (two) times daily. 06/27/15  Yes Reyne Dumas, MD  HYDROcodone-acetaminophen (NORCO/VICODIN) 5-325 MG tablet Take 1 tablet by mouth every 4 (four) hours as needed for moderate pain. 05/23/16  Yes Curt Bears, MD  magnesium oxide (MAG-OX) 400 (241.3 Mg) MG tablet Take 1 tablet (400 mg total) by mouth 2 (two) times daily. 02/18/16  Yes Maryann Mikhail, DO  metoCLOPramide (REGLAN) 10 MG tablet Take 10 mg by mouth every 6 (six) hours as needed for nausea or vomiting.  03/29/16  Yes Historical Provider, MD  morphine (MS CONTIN) 15 MG 12 hr tablet Take 1 tablet (15 mg total) by mouth every 12 (twelve) hours. 05/23/16  Yes Curt Bears, MD  ondansetron (ZOFRAN-ODT) 8 MG disintegrating tablet DISSOLVE 1 TABLET BY MOUTH EVERY 8 HOURS AS NEEDED FOR NAUSEA AND VOMITING 04/04/16  Yes Curt Bears, MD  OXYGEN Inhale 2 L into the lungs continuous.   Yes Historical Provider, MD  polyethylene glycol (MIRALAX / GLYCOLAX) packet MX AND DRK 1 PACKET PO QD MIXED WITH 8 OUNCES OF FLUID 02/22/16  Yes Historical Provider, MD  potassium chloride SA (K-DUR,KLOR-CON) 20 MEQ tablet Take 1 tablet (20 mEq total) by mouth daily. 05/16/16  Yes Wyatt Portela, MD  predniSONE (DELTASONE) 5 MG tablet Take 5 mg by mouth daily. 02/28/16  Yes Historical Provider, MD  simvastatin (ZOCOR) 5 MG tablet Take 10 mg by mouth at bedtime.    Yes Historical Provider, MD  diltiazem (DILACOR XR) 180 MG 24 hr capsule Take 180 mg by mouth every evening.     Historical Provider, MD  pantoprazole (PROTONIX) 40 MG tablet Take 40 mg by mouth daily as needed (acid reflux).  01/16/16 03/27/16  Historical Provider, MD    Family History Family History  Problem Relation Age of Onset  . Diabetes Mother     insulin dependent  . Breast cancer Mother     Social History Social History  Substance Use Topics  . Smoking status: Former Smoker    Packs/day:  0.50    Years: 40.00    Types: Cigarettes    Quit date: 10/29/2009  . Smokeless tobacco: Never Used  . Alcohol use No     Allergies   Shellfish allergy   Review of Systems Review of Systems  All other systems reviewed and are negative.    Physical Exam Updated Vital Signs BP 120/74 (BP Location: Right Arm)   Pulse (!) 128   Temp 98.3 F (36.8 C) (Oral)   Resp 18   Ht '5\' 5"'$  (1.651 m)   Wt 52.2 kg   SpO2 100%   BMI 19.14 kg/m   Physical Exam  Constitutional: She is oriented to person, place, and time. She appears well-developed and well-nourished.  Non-toxic appearance. No distress.  HENT:  Head: Normocephalic and atraumatic.  Eyes: Conjunctivae, EOM and lids are normal.  Pupils are equal, round, and reactive to light.  Neck: Normal range of motion. Neck supple. No tracheal deviation present. No thyroid mass present.  Cardiovascular: Regular rhythm and normal heart sounds.  Tachycardia present.  Exam reveals no gallop.   No murmur heard. Pulmonary/Chest: Effort normal and breath sounds normal. No stridor. No respiratory distress. She has no decreased breath sounds. She has no wheezes. She has no rhonchi. She has no rales.  Abdominal: Soft. Normal appearance and bowel sounds are normal. She exhibits no distension. There is no tenderness. There is no rebound and no CVA tenderness.  Musculoskeletal: Normal range of motion. She exhibits no edema or tenderness.  Neurological: She is alert and oriented to person, place, and time. She has normal strength. No cranial nerve deficit or sensory deficit. GCS eye subscore is 4. GCS verbal subscore is 5. GCS motor subscore is 6.  Skin: Skin is warm and dry. No abrasion and no rash noted.  Psychiatric: She has a normal mood and affect. Her speech is normal and behavior is normal.  Nursing note and vitals reviewed.    ED Treatments / Results  Labs (all labs ordered are listed, but only abnormal results are displayed) Labs Reviewed -  No data to display  EKG  EKG Interpretation None       Radiology No results found.  Procedures Procedures (including critical care time)  Medications Ordered in ED Medications - No data to display   Initial Impression / Assessment and Plan / ED Course  I have reviewed the triage vital signs and the nursing notes.  Pertinent labs & imaging results that were available during my care of the patient were reviewed by me and considered in my medical decision making (see chart for details).  Clinical Course    Patient hydrated here with 2 L of saline. She was treated for pain and nausea. Does have leukocytosis noted on CBC but did have a recent shot of Neulasta. Patient states that she is chronically tachycardic and is requesting to go home at this time. I have offered her further observation and treatment here which she has declined  Final Clinical Impressions(s) / ED Diagnoses   Final diagnoses:  None    New Prescriptions New Prescriptions   No medications on file     Lacretia Leigh, MD 05/27/16 [phone]

## 2016-05-28 ENCOUNTER — Emergency Department (HOSPITAL_COMMUNITY): Payer: Medicare Other

## 2016-05-28 ENCOUNTER — Inpatient Hospital Stay (HOSPITAL_COMMUNITY)
Admission: EM | Admit: 2016-05-28 | Discharge: 2016-06-06 | DRG: 391 | Disposition: A | Discharge status: Home / Self Care | Payer: Medicare Other | Attending: Internal Medicine | Admitting: Internal Medicine

## 2016-05-28 ENCOUNTER — Other Ambulatory Visit: Payer: Self-pay

## 2016-05-28 ENCOUNTER — Encounter (HOSPITAL_COMMUNITY): Payer: Self-pay | Admitting: Emergency Medicine

## 2016-05-28 DIAGNOSIS — K529 Noninfective gastroenteritis and colitis, unspecified: Secondary | ICD-10-CM | POA: Diagnosis present

## 2016-05-28 DIAGNOSIS — C3410 Malignant neoplasm of upper lobe, unspecified bronchus or lung: Secondary | ICD-10-CM | POA: Diagnosis present

## 2016-05-28 DIAGNOSIS — M069 Rheumatoid arthritis, unspecified: Secondary | ICD-10-CM | POA: Diagnosis present

## 2016-05-28 DIAGNOSIS — R1110 Vomiting, unspecified: Secondary | ICD-10-CM

## 2016-05-28 DIAGNOSIS — Z9981 Dependence on supplemental oxygen: Secondary | ICD-10-CM | POA: Diagnosis not present

## 2016-05-28 DIAGNOSIS — Z833 Family history of diabetes mellitus: Secondary | ICD-10-CM | POA: Diagnosis not present

## 2016-05-28 DIAGNOSIS — D6481 Anemia due to antineoplastic chemotherapy: Secondary | ICD-10-CM | POA: Diagnosis present

## 2016-05-28 DIAGNOSIS — R042 Hemoptysis: Secondary | ICD-10-CM | POA: Diagnosis not present

## 2016-05-28 DIAGNOSIS — E860 Dehydration: Secondary | ICD-10-CM | POA: Diagnosis not present

## 2016-05-28 DIAGNOSIS — E43 Unspecified severe protein-calorie malnutrition: Secondary | ICD-10-CM | POA: Diagnosis present

## 2016-05-28 DIAGNOSIS — Z87891 Personal history of nicotine dependence: Secondary | ICD-10-CM | POA: Diagnosis not present

## 2016-05-28 DIAGNOSIS — C3490 Malignant neoplasm of unspecified part of unspecified bronchus or lung: Secondary | ICD-10-CM | POA: Diagnosis not present

## 2016-05-28 DIAGNOSIS — D899 Disorder involving the immune mechanism, unspecified: Secondary | ICD-10-CM

## 2016-05-28 DIAGNOSIS — R112 Nausea with vomiting, unspecified: Secondary | ICD-10-CM | POA: Diagnosis not present

## 2016-05-28 DIAGNOSIS — E785 Hyperlipidemia, unspecified: Secondary | ICD-10-CM | POA: Diagnosis present

## 2016-05-28 DIAGNOSIS — J9611 Chronic respiratory failure with hypoxia: Secondary | ICD-10-CM | POA: Diagnosis not present

## 2016-05-28 DIAGNOSIS — I10 Essential (primary) hypertension: Secondary | ICD-10-CM | POA: Diagnosis present

## 2016-05-28 DIAGNOSIS — J440 Chronic obstructive pulmonary disease with acute lower respiratory infection: Secondary | ICD-10-CM | POA: Diagnosis present

## 2016-05-28 DIAGNOSIS — A419 Sepsis, unspecified organism: Secondary | ICD-10-CM | POA: Diagnosis present

## 2016-05-28 DIAGNOSIS — J449 Chronic obstructive pulmonary disease, unspecified: Secondary | ICD-10-CM | POA: Diagnosis not present

## 2016-05-28 DIAGNOSIS — J189 Pneumonia, unspecified organism: Secondary | ICD-10-CM | POA: Diagnosis not present

## 2016-05-28 DIAGNOSIS — R000 Tachycardia, unspecified: Secondary | ICD-10-CM | POA: Diagnosis not present

## 2016-05-28 DIAGNOSIS — T451X5A Adverse effect of antineoplastic and immunosuppressive drugs, initial encounter: Secondary | ICD-10-CM | POA: Diagnosis not present

## 2016-05-28 DIAGNOSIS — J9610 Chronic respiratory failure, unspecified whether with hypoxia or hypercapnia: Secondary | ICD-10-CM | POA: Diagnosis present

## 2016-05-28 DIAGNOSIS — K521 Toxic gastroenteritis and colitis: Secondary | ICD-10-CM | POA: Diagnosis not present

## 2016-05-28 DIAGNOSIS — R0600 Dyspnea, unspecified: Secondary | ICD-10-CM

## 2016-05-28 DIAGNOSIS — Z803 Family history of malignant neoplasm of breast: Secondary | ICD-10-CM | POA: Diagnosis not present

## 2016-05-28 DIAGNOSIS — R0602 Shortness of breath: Secondary | ICD-10-CM | POA: Diagnosis not present

## 2016-05-28 DIAGNOSIS — Z681 Body mass index (BMI) 19 or less, adult: Secondary | ICD-10-CM | POA: Diagnosis not present

## 2016-05-28 DIAGNOSIS — J441 Chronic obstructive pulmonary disease with (acute) exacerbation: Secondary | ICD-10-CM | POA: Diagnosis present

## 2016-05-28 DIAGNOSIS — D72829 Elevated white blood cell count, unspecified: Secondary | ICD-10-CM

## 2016-05-28 DIAGNOSIS — E876 Hypokalemia: Secondary | ICD-10-CM

## 2016-05-28 DIAGNOSIS — Z66 Do not resuscitate: Secondary | ICD-10-CM | POA: Diagnosis present

## 2016-05-28 DIAGNOSIS — Y95 Nosocomial condition: Secondary | ICD-10-CM | POA: Diagnosis not present

## 2016-05-28 DIAGNOSIS — C3411 Malignant neoplasm of upper lobe, right bronchus or lung: Secondary | ICD-10-CM | POA: Diagnosis present

## 2016-05-28 DIAGNOSIS — Z923 Personal history of irradiation: Secondary | ICD-10-CM | POA: Diagnosis not present

## 2016-05-28 DIAGNOSIS — D849 Immunodeficiency, unspecified: Secondary | ICD-10-CM | POA: Diagnosis present

## 2016-05-28 DIAGNOSIS — M7989 Other specified soft tissue disorders: Secondary | ICD-10-CM | POA: Diagnosis not present

## 2016-05-28 LAB — COMPREHENSIVE METABOLIC PANEL
ALK PHOS: 89 U/L (ref 38–126)
ALT: 11 U/L — AB (ref 14–54)
ANION GAP: 9 (ref 5–15)
AST: 14 U/L — ABNORMAL LOW (ref 15–41)
Albumin: 3.4 g/dL — ABNORMAL LOW (ref 3.5–5.0)
BUN: 10 mg/dL (ref 6–20)
CHLORIDE: 100 mmol/L — AB (ref 101–111)
CO2: 29 mmol/L (ref 22–32)
Calcium: 8.7 mg/dL — ABNORMAL LOW (ref 8.9–10.3)
Creatinine, Ser: 0.64 mg/dL (ref 0.44–1.00)
GLUCOSE: 93 mg/dL (ref 65–99)
Potassium: 3 mmol/L — ABNORMAL LOW (ref 3.5–5.1)
Sodium: 138 mmol/L (ref 135–145)
Total Bilirubin: 1.1 mg/dL (ref 0.3–1.2)
Total Protein: 6.8 g/dL (ref 6.5–8.1)

## 2016-05-28 LAB — CBC
HCT: 29.9 % — ABNORMAL LOW (ref 36.0–46.0)
Hemoglobin: 9.9 g/dL — ABNORMAL LOW (ref 12.0–15.0)
MCH: 27 pg (ref 26.0–34.0)
MCHC: 33.1 g/dL (ref 30.0–36.0)
MCV: 81.5 fL (ref 78.0–100.0)
Platelets: 477 10*3/uL — ABNORMAL HIGH (ref 150–400)
RBC: 3.67 MIL/uL — ABNORMAL LOW (ref 3.87–5.11)
RDW: 17 % — ABNORMAL HIGH (ref 11.5–15.5)
WBC: 12.1 10*3/uL — ABNORMAL HIGH (ref 4.0–10.5)

## 2016-05-28 LAB — I-STAT CG4 LACTIC ACID, ED: Lactic Acid, Venous: 1.82 mmol/L (ref 0.5–1.9)

## 2016-05-28 LAB — LIPASE, BLOOD: LIPASE: 18 U/L (ref 11–51)

## 2016-05-28 MED ORDER — BUDESONIDE 0.25 MG/2ML IN SUSP
0.2500 mg | Freq: Two times a day (BID) | RESPIRATORY_TRACT | Status: DC
  Administered 2016-05-28 – 2016-06-06 (×18): 0.25 mg via RESPIRATORY_TRACT
  Filled 2016-05-28 (×18): qty 2

## 2016-05-28 MED ORDER — ALBUTEROL SULFATE (2.5 MG/3ML) 0.083% IN NEBU
INHALATION_SOLUTION | RESPIRATORY_TRACT | Status: AC
Start: 2016-05-28 — End: 2016-05-28
  Filled 2016-05-28: qty 3

## 2016-05-28 MED ORDER — MORPHINE SULFATE (PF) 2 MG/ML IV SOLN
2.0000 mg | INTRAVENOUS | Status: DC | PRN
Start: 2016-05-28 — End: 2016-06-06
  Administered 2016-05-28 – 2016-06-05 (×13): 2 mg via INTRAVENOUS
  Filled 2016-05-28 (×14): qty 1

## 2016-05-28 MED ORDER — PREDNISONE 5 MG PO TABS
5.0000 mg | ORAL_TABLET | Freq: Every day | ORAL | Status: DC
  Filled 2016-05-28: qty 1

## 2016-05-28 MED ORDER — MAGNESIUM OXIDE 400 (241.3 MG) MG PO TABS
400.0000 mg | ORAL_TABLET | Freq: Two times a day (BID) | ORAL | Status: DC
  Administered 2016-05-29 – 2016-06-06 (×16): 400 mg via ORAL
  Filled 2016-05-28 (×16): qty 1

## 2016-05-28 MED ORDER — SODIUM CHLORIDE 0.9% FLUSH
3.0000 mL | Freq: Two times a day (BID) | INTRAVENOUS | Status: DC
  Administered 2016-05-29 – 2016-06-05 (×9): 3 mL via INTRAVENOUS

## 2016-05-28 MED ORDER — ENOXAPARIN SODIUM 40 MG/0.4ML ~~LOC~~ SOLN
40.0000 mg | SUBCUTANEOUS | Status: DC
  Administered 2016-05-28 – 2016-06-05 (×9): 40 mg via SUBCUTANEOUS
  Filled 2016-05-28 (×9): qty 0.4

## 2016-05-28 MED ORDER — POTASSIUM CHLORIDE 10 MEQ/100ML IV SOLN
INTRAVENOUS | Status: AC
  Administered 2016-05-28: 10 meq via INTRAVENOUS
  Filled 2016-05-28: qty 100

## 2016-05-28 MED ORDER — IPRATROPIUM BROMIDE 0.02 % IN SOLN
0.5000 mg | Freq: Once | RESPIRATORY_TRACT | Status: AC
  Administered 2016-05-28: 0.5 mg via RESPIRATORY_TRACT
  Filled 2016-05-28: qty 2.5

## 2016-05-28 MED ORDER — ONDANSETRON HCL 4 MG/2ML IJ SOLN
4.0000 mg | Freq: Once | INTRAMUSCULAR | Status: AC
  Administered 2016-05-28: 4 mg via INTRAVENOUS
  Filled 2016-05-28: qty 2

## 2016-05-28 MED ORDER — CEFTRIAXONE SODIUM 1 G IJ SOLR
1.0000 g | INTRAMUSCULAR | Status: DC
  Administered 2016-05-28: 1 g via INTRAVENOUS
  Filled 2016-05-28: qty 10

## 2016-05-28 MED ORDER — CALCIUM CARBONATE-VITAMIN D 500-200 MG-UNIT PO TABS
1.0000 | ORAL_TABLET | Freq: Every day | ORAL | Status: DC
Start: 2016-05-28 — End: 2016-05-29
  Filled 2016-05-28: qty 1

## 2016-05-28 MED ORDER — ALBUTEROL SULFATE (2.5 MG/3ML) 0.083% IN NEBU
2.5000 mg | INHALATION_SOLUTION | RESPIRATORY_TRACT | Status: DC
  Administered 2016-05-28: 2.5 mg via RESPIRATORY_TRACT

## 2016-05-28 MED ORDER — POTASSIUM CHLORIDE 10 MEQ/100ML IV SOLN
10.0000 meq | INTRAVENOUS | Status: DC
Start: 2016-05-28 — End: 2016-05-28
  Administered 2016-05-28 (×3): 10 meq via INTRAVENOUS
  Filled 2016-05-28 (×2): qty 100

## 2016-05-28 MED ORDER — MORPHINE SULFATE (PF) 4 MG/ML IV SOLN
4.0000 mg | Freq: Once | INTRAVENOUS | Status: AC
  Administered 2016-05-28: 4 mg via INTRAVENOUS
  Filled 2016-05-28: qty 1

## 2016-05-28 MED ORDER — SODIUM CHLORIDE 0.9 % IV SOLN
INTRAVENOUS | Status: DC
  Administered 2016-05-28 – 2016-06-03 (×9): via INTRAVENOUS

## 2016-05-28 MED ORDER — SIMVASTATIN 20 MG PO TABS: 10.0000 mg | ORAL_TABLET | Freq: Every day | ORAL | Status: DC

## 2016-05-28 MED ORDER — LEVALBUTEROL HCL 0.63 MG/3ML IN NEBU
0.6300 mg | INHALATION_SOLUTION | Freq: Three times a day (TID) | RESPIRATORY_TRACT | Status: DC
  Administered 2016-05-29 – 2016-06-06 (×26): 0.63 mg via RESPIRATORY_TRACT
  Filled 2016-05-28 (×26): qty 3

## 2016-05-28 MED ORDER — MORPHINE SULFATE ER 15 MG PO TBCR
15.0000 mg | EXTENDED_RELEASE_TABLET | Freq: Two times a day (BID) | ORAL | Status: DC
  Administered 2016-05-29 – 2016-06-06 (×17): 15 mg via ORAL
  Filled 2016-05-28 (×17): qty 1

## 2016-05-28 MED ORDER — GUAIFENESIN ER 600 MG PO TB12: 600.0000 mg | ORAL_TABLET | Freq: Two times a day (BID) | ORAL | Status: DC

## 2016-05-28 MED ORDER — ONDANSETRON HCL 4 MG/2ML IJ SOLN
4.0000 mg | Freq: Four times a day (QID) | INTRAMUSCULAR | Status: DC | PRN
  Administered 2016-05-31: 4 mg via INTRAVENOUS
  Filled 2016-05-28: qty 2

## 2016-05-28 MED ORDER — SODIUM CHLORIDE 0.9% FLUSH
10.0000 mL | INTRAVENOUS | Status: DC | PRN
  Administered 2016-06-01 – 2016-06-05 (×4): 10 mL
  Filled 2016-05-28 (×4): qty 40

## 2016-05-28 MED ORDER — BENZONATATE 100 MG PO CAPS: 100.0000 mg | ORAL_CAPSULE | Freq: Three times a day (TID) | ORAL | Status: DC | PRN

## 2016-05-28 MED ORDER — ALBUTEROL SULFATE (2.5 MG/3ML) 0.083% IN NEBU
2.5000 mg | INHALATION_SOLUTION | RESPIRATORY_TRACT | Status: DC | PRN
  Administered 2016-06-01 – 2016-06-02 (×2): 2.5 mg via RESPIRATORY_TRACT
  Filled 2016-05-28 (×2): qty 3

## 2016-05-28 MED ORDER — ALBUTEROL SULFATE (2.5 MG/3ML) 0.083% IN NEBU
5.0000 mg | INHALATION_SOLUTION | Freq: Once | RESPIRATORY_TRACT | Status: AC
  Administered 2016-05-28: 5 mg via RESPIRATORY_TRACT
  Filled 2016-05-28: qty 6

## 2016-05-28 MED ORDER — SODIUM CHLORIDE 0.9 % IV BOLUS (SEPSIS)
1000.0000 mL | Freq: Once | INTRAVENOUS | Status: AC
Start: 2016-05-28 — End: 2016-05-28
  Administered 2016-05-28: 1000 mL via INTRAVENOUS

## 2016-05-28 MED ORDER — HYDROCODONE-ACETAMINOPHEN 5-325 MG PO TABS
1.0000 | ORAL_TABLET | ORAL | Status: DC | PRN
Start: 2016-05-28 — End: 2016-06-06
  Administered 2016-05-31 – 2016-06-06 (×17): 1 via ORAL
  Filled 2016-05-28 (×19): qty 1

## 2016-05-28 MED ORDER — SODIUM CHLORIDE 0.9 % IV BOLUS (SEPSIS)
1000.0000 mL | Freq: Once | INTRAVENOUS | Status: AC
  Administered 2016-05-28: 1000 mL via INTRAVENOUS

## 2016-05-28 NOTE — H&P (Signed)
History and Physical    Felicitas Sine KZL:[id number] DOB: 1940-11-03 DOA: 05/28/2016  PCP: Thressa Sheller, MD  Patient coming from: Home  Chief Complaint: Nausea/vomiting  HPI:  Chanise Habeck is a 75 y.o. female with a past medical history metastatic non-small cell lung cancer diagnosed in March 2013 status post palliative radiation therapy to left lung mass, systemic therapy with carboplatin and Abraxane, also receiving immunotherapy with Nivolumab in 2016, currently receiving chemotherapy with Docetaxel and Cyramza every 3 weeks with Neulasta support, under the care of Dr. Julien Nordmann. She was last seen by Dr. Julien Nordmann on 05/23/2016. She received her last infusion with chemotherapy on 05/23/2016 at the cancer center then got a dose of Neulasta last Saturday. Then on Sunday she developed intractible nausea and vomiting presenting to the ED on 05/27/2016 where she received IV fluids, IV antiemetic therapy and discharged home.  She reports in the last 24 hours unable to keep even her medications down which has led to uncontrolled pain symptoms (unable to take Norco and MS Contin). She denies abdominal pain, diarrhea, hematemesis, bloody stools, chest pain, shortness of breath.  ED Course: In the emergency department she was started on IV fluid resuscitation, given IV Zofran, IV potassium 3, IV morphine. During my evaluation she reported feeling better.  Review of Systems: As per HPI otherwise 10 point review of systems negative.    Past Medical History:  Diagnosis Date  . Asthma   . COPD (chronic obstructive pulmonary disease) ([place])   . DNR (do not resuscitate) 06/26/2015  . Dysphagia 12/07/2015  . Encounter for antineoplastic immunotherapy 05/22/2015  . Hiatal hernia   . History of chemotherapy   . History of radiation therapy 03/06/2012   left hilum  . History of radiation therapy 05/10/2013-05/31/2013   Left lung/ 33/75'@2'$ .25 per fraction x 15 fractions  . Hyperlipidemia   . Neutropenia, drug-induced  ([place]) 05/05/2012  . Non-small cell lung cancer ([place]) dx'd 08/28/11   left lung  . Primary cancer of right upper lobe of lung ([place]) 02/06/2012  . Radiation 11/15/13-11/26/13   Right hilum 30 Gy in 10 fractions  . Rheumatoid arthritis(714.0)     Past Surgical History:  Procedure Laterality Date  . appendex  1962  . surgery on right wrist    . VIDEO BRONCHOSCOPY  01/28/2012   Procedure: VIDEO BRONCHOSCOPY WITHOUT FLUORO;  Surgeon: Brand Males, MD;  Location: [facility] ENDOSCOPY;  Service: Endoscopy;  Laterality: Bilateral;     reports that she quit smoking about 6 years ago. Her smoking use included Cigarettes. She has a 20.00 pack-year smoking history. She has never used smokeless tobacco. She reports that she does not drink alcohol or use drugs.  Allergies  Allergen Reactions  . Shellfish Allergy Swelling    Family History  Problem Relation Age of Onset  . Diabetes Mother     insulin dependent  . Breast cancer Mother      Prior to Admission medications   Medication Sig Start Date End Date Taking? Authorizing Provider  albuterol (PROAIR HFA) 108 (90 BASE) MCG/ACT inhaler Inhale 2 puffs into the lungs every 6 (six) hours as needed for wheezing or shortness of breath. 08/31/15  Yes Brand Males, MD  benzonatate (TESSALON) 100 MG capsule Take 100 mg by mouth every 8 (eight) hours as needed for cough.  01/05/16  Yes Historical Provider, MD  budesonide (PULMICORT) 0.25 MG/2ML nebulizer solution Take 2 mLs (0.25 mg total) by nebulization 2 (two) times daily. Patient taking differently: Take 0.25 mg by nebulization  3 (three) times daily.  08/20/15  Yes Belkys A Regalado, MD  calcium-vitamin D (OSCAL WITH D) 500-200 MG-UNIT per tablet Take 1 tablet by mouth daily.   Yes Historical Provider, MD  Cholecalciferol (VITAMIN D PO) Take 1 tablet by mouth daily.   Yes Historical Provider, MD  dexamethasone (DECADRON) 4 MG tablet Take 4-12 mg by mouth as directed. Instructions: Every 3 weeks: Take 2  tablets the day before chemo, Take 3 tablets the day of chemo, Take 3 tablets the day after chemo. 04/09/16  Yes Historical Provider, MD  diltiazem (DILACOR XR) 180 MG 24 hr capsule Take 180 mg by mouth every evening.    Yes Historical Provider, MD  folic acid (FOLVITE) 846 MCG tablet Take 400 mcg by mouth daily.   Yes Historical Provider, MD  furosemide (LASIX) 20 MG tablet Take 20 mg by mouth daily.  04/30/16  Yes Historical Provider, MD  guaiFENesin (MUCINEX) 600 MG 12 hr tablet Take 1 tablet (600 mg total) by mouth 2 (two) times daily. 06/27/15  Yes Reyne Dumas, MD  HYDROcodone-acetaminophen (NORCO/VICODIN) 5-325 MG tablet Take 1 tablet by mouth every 4 (four) hours as needed for moderate pain. 05/23/16  Yes Curt Bears, MD  magnesium oxide (MAG-OX) 400 (241.3 Mg) MG tablet Take 1 tablet (400 mg total) by mouth 2 (two) times daily. 02/18/16  Yes Maryann Mikhail, DO  metoCLOPramide (REGLAN) 10 MG tablet Take 10 mg by mouth every 6 (six) hours as needed for nausea or vomiting.  03/29/16  Yes Historical Provider, MD  morphine (MS CONTIN) 15 MG 12 hr tablet Take 1 tablet (15 mg total) by mouth every 12 (twelve) hours. 05/23/16  Yes Curt Bears, MD  ondansetron (ZOFRAN-ODT) 8 MG disintegrating tablet DISSOLVE 1 TABLET BY MOUTH EVERY 8 HOURS AS NEEDED FOR NAUSEA AND VOMITING 04/04/16  Yes Curt Bears, MD  OXYGEN Inhale 2 L into the lungs continuous.   Yes Historical Provider, MD  polyethylene glycol (MIRALAX / GLYCOLAX) packet MX AND DRK 1 PACKET PO QD MIXED WITH 8 OUNCES OF FLUID 02/22/16  Yes Historical Provider, MD  potassium chloride SA (K-DUR,KLOR-CON) 20 MEQ tablet Take 1 tablet (20 mEq total) by mouth daily. 05/16/16  Yes Wyatt Portela, MD  predniSONE (DELTASONE) 5 MG tablet Take 5 mg by mouth daily. 02/28/16  Yes Historical Provider, MD  simvastatin (ZOCOR) 5 MG tablet Take 10 mg by mouth at bedtime.    Yes Historical Provider, MD    Physical Exam: Vitals:   05/28/16 1500 05/28/16 1502  05/28/16 1541 05/28/16 1623  BP:  130/71 132/59 130/65  Pulse:  (!) 126 (!) 124 (!) 127  Resp:  '25 24 18  '$ Temp:      TempSrc:      SpO2:  100% 96% 99%  Weight: 52.2 kg (115 lb)     Height: '5\' 3"'$  (1.6 m)         Constitutional: She is awake and alert nontoxic-appearing chronically ill appearing however Vitals:   05/28/16 1500 05/28/16 1502 05/28/16 1541 05/28/16 1623  BP:  130/71 132/59 130/65  Pulse:  (!) 126 (!) 124 (!) 127  Resp:  '25 24 18  '$ Temp:      TempSrc:      SpO2:  100% 96% 99%  Weight: 52.2 kg (115 lb)     Height: '5\' 3"'$  (1.6 m)      Eyes: PERRL, lids and conjunctivae normal ENMT: Mucous membranes are moist. Posterior pharynx clear of any exudate or lesions.Normal  dentition. Dry oral mucosa Neck: normal, supple, no masses, no thyromegaly, flattened neck veins Respiratory: On supplemental oxygen, positive rhonchi, bibasilar crackles Cardiovascular: Regular rate and rhythm, no murmurs / rubs / gallops. No extremity edema. 2+ pedal pulses. No carotid bruits.  Abdomen: no tenderness, no masses palpated. No hepatosplenomegaly. Bowel sounds positive. Overall she had a benign abdominal exam Musculoskeletal: no clubbing / cyanosis. No joint deformity upper and lower extremities. Good ROM, no contractures. Normal muscle tone.  Skin: no rashes, lesions, ulcers. No induration Neurologic: CN 2-12 grossly intact. Sensation intact, DTR normal. Strength 5/5 in all 4.  Psychiatric: Normal judgment and insight. Alert and oriented x 3. Normal mood.     Labs on Admission: I have personally reviewed following labs and imaging studies  CBC:  Recent Labs Lab 05/23/16 1013 05/27/16 1544 05/28/16 1451  WBC 10.6* 28.7* 12.1*  NEUTROABS 10.0* 27.2*  --   HGB 9.5* 8.6* 9.9*  HCT 29.3* 26.2* 29.9*  MCV 81.7 81.6 81.5  PLT 789* 505* 401*   Basic Metabolic Panel:  Recent Labs Lab 05/23/16 1013 05/27/16 1544 05/28/16 1451  NA 135* 139 138  K 4.4 2.9* 3.0*  CL  --  102 100*    CO2 '25 28 29  '$ GLUCOSE 295* 86 93  BUN 10.'1 15 10  '$ CREATININE 0.8 0.55 0.64  CALCIUM 9.4 8.1* 8.7*   GFR: Estimated Creatinine Clearance: 49.3 mL/min (by C-G formula based on SCr of 0.8 mg/dL). Liver Function Tests:  Recent Labs Lab 05/23/16 1013 05/27/16 1544 05/28/16 1451  AST 9 14* 14*  ALT <9 12* 11*  ALKPHOS 87 69 89  BILITOT 0.47 0.8 1.1  PROT 6.8 5.8* 6.8  ALBUMIN 2.4* 2.9* 3.4*    Recent Labs Lab 05/27/16 1544 05/28/16 1451  LIPASE 26 18   No results for input(s): AMMONIA in the last 168 hours. Coagulation Profile: No results for input(s): INR, PROTIME in the last 168 hours. Cardiac Enzymes: No results for input(s): CKTOTAL, CKMB, CKMBINDEX, TROPONINI in the last 168 hours. BNP (last 3 results) No results for input(s): PROBNP in the last 8760 hours. HbA1C: No results for input(s): HGBA1C in the last 72 hours. CBG: No results for input(s): GLUCAP in the last 168 hours. Lipid Profile: No results for input(s): CHOL, HDL, LDLCALC, TRIG, CHOLHDL, LDLDIRECT in the last 72 hours. Thyroid Function Tests: No results for input(s): TSH, T4TOTAL, FREET4, T3FREE, THYROIDAB in the last 72 hours. Anemia Panel: No results for input(s): VITAMINB12, FOLATE, FERRITIN, TIBC, IRON, RETICCTPCT in the last 72 hours. Urine analysis:    Component Value Date/Time   COLORURINE YELLOW 05/27/2016 [place] 05/27/2016 1659   LABSPEC 1.011 05/27/2016 1659   PHURINE 7.0 05/27/2016 1659   GLUCOSEU NEGATIVE 05/27/2016 1659   HGBUR NEGATIVE 05/27/2016 1659   BILIRUBINUR NEGATIVE 05/27/2016 1659   KETONESUR NEGATIVE 05/27/2016 1659   PROTEINUR NEGATIVE 05/27/2016 1659   UROBILINOGEN 0.2 08/18/2015 0730   NITRITE NEGATIVE 05/27/2016 1659   LEUKOCYTESUR SMALL (A) 05/27/2016 1659   Sepsis Labs: !!!!!!!!!!!!!!!!!!!!!!!!!!!!!!!!!!!!!!!!!!!! '@LABRCNTIP'$ (procalcitonin:4,lacticidven:4) )No results found for this or any previous visit (from the past 240 hour(s)).    Radiological Exams on Admission: Dg Chest 2 View  Result Date: 05/28/2016 CLINICAL DATA:  SOB increasing x 2 days - currently being treated for lung cancer - hx COPD - hx asthma EXAM: CHEST  2 VIEW COMPARISON:  05/07/2016 FINDINGS: The cardiac silhouette is normal in size. No mediastinal masses or convincing adenopathy. Coarse reticular type opacity extends from  superior left hilum. There is fullness from the inferior right hilum. These findings stable. Lungs are hyperexpanded but otherwise clear. No pleural effusion or pneumothorax. Right anterior chest wall Port-A-Cath is stable. Bony thorax is demineralized. Mild compression fracture of a mid to upper thoracic vertebra, also stable. No convincing osteoblastic or osteolytic lesions. IMPRESSION: No acute cardiopulmonary disease. No significant change from the previous chest radiograph. Electronically Signed   By: Lajean Manes M.D.   On: 05/28/2016 14:49    EKG: Independently reviewed.   Assessment/Plan Principal Problem:   Nausea & vomiting Active Problems:   Dehydration   COPD, moderate (HCC)   Primary cancer of right upper lobe of lung (HCC)   Hypokalemia    1.  Intractable nausea and vomiting. Mrs Broyles is a pleasant 75 year old female with a history of lung cancer undergoing chemotherapy at the [facility], having her last infusion on 05/23/2016. She reported getting Neulasta on 05/25/2016 after which she developed multiple episodes of nausea and vomiting. She reports having a history of nausea vomiting to Neulasta. She appears dehydrated on exam, unable to keep by mouth down including medications, will admit her place her on IV fluids, IV antiemetic therapy as well as IV narcotic analgesics for pain management.  2.  History of non-small cell lung cancer. She currently receives her care at the [facility], follows Dr. Julien Nordmann. Currently receiving chemotherapy with Docetaxel and Cyramza every 3 weeks with Neulasta support, last  infusion on 05/23/2016. He presents with intractable nausea and vomiting and is being admitted for IV fluids and supportive care. Will hold oral narcotic analgesics for now, provide IV morphine for pain management. Dr Julien Nordmann placed on consult list on EPIC.   3.  Hypokalemia. Lab work in the emergency room showing potassium of 3.0, was administered IV potassium chloride x 3. Likely secondary to GI losses, follow-up on a.m. Labs  4.  Leukocytosis. Lab work on 05/27/2016 showing white count of 28,700, I suspect related to Neulasta. On exam she appears nontoxic is afebrile, lactic acid level of 1.8, will continue to monitor.  5.  Urinary tract infection. Urinalysis showing the presence of many bacteria, will provide IV ceftriaxone  6.  Hypertension. Blood pressures in the emergency room stable, having last blood pressure 126/67. Holding Cardizem for now, reassess in a.m.  DVT prophylaxis: Lovenox  Code Status: CODE STATUS discussed with patient she is a DO NOT RESUSCITATE Family Communication: Spoke to her husband and son were present at bedside Disposition Plan: Will admit for IV fluids, supportive care, anticipate she'll require greater than 2 nights hospitalization  Consults called: Medical oncology notified-Dr [facility] Admission status: Inpatient   Kelvin Cellar MD [facility] Pager [phone]  If 7PM-7AM, please contact night-coverage [email or web address] Password [facility]  05/28/2016, 4:39 PM

## 2016-05-28 NOTE — Progress Notes (Signed)
Received patient from ED, VS obtained, telemetry applied, oriented to unit, call light placed in reach

## 2016-05-28 NOTE — ED Provider Notes (Signed)
[facility] DEPT Provider Note   CSN: [id number] Arrival date & time: 05/28/16  1313  First Provider Contact:  First MD Initiated Contact with Patient 05/28/16 1422        History   Chief Complaint Chief Complaint  Patient presents with  . Emesis    HPI Destiny Harrison is a 75 y.o. female.  75 yo F with PMHx of NSCLC, COPD, recurrent chemo-induced n/v who presents with persistent emesis. Pt was just seen overnight for similar sx. She recently completed a course of chemo and had a Neulasta injection several days ago. Since then, she has had persistent, intractable nausea and vomiting. This has occurred with her injections in the past. No abdominal pain. She was given fluids, antiemetics overnight and felt well - she wanted to try management at home. However, she has been unable to tolerate any PO including her pain meds, and subsequently presents for admission. No fever or chills. No chest pain.   The history is provided by the patient.    Past Medical History:  Diagnosis Date  . Asthma   . COPD (chronic obstructive pulmonary disease) ([place])   . DNR (do not resuscitate) 06/26/2015  . Dysphagia 12/07/2015  . Encounter for antineoplastic immunotherapy 05/22/2015  . Hiatal hernia   . History of chemotherapy   . History of radiation therapy 03/06/2012   left hilum  . History of radiation therapy 05/10/2013-05/31/2013   Left lung/ 33/75'@2'$ .25 per fraction x 15 fractions  . Hyperlipidemia   . Neutropenia, drug-induced ([place]) 05/05/2012  . Non-small cell lung cancer ([place]) dx'd 08/28/11   left lung  . Primary cancer of right upper lobe of lung ([place]) 02/06/2012  . Radiation 11/15/13-11/26/13   Right hilum 30 Gy in 10 fractions  . Rheumatoid arthritis(714.0)     Patient Active Problem List   Diagnosis Date Noted  . Chemotherapy-induced enteritis 05/29/2016  . Nausea & vomiting 05/28/2016  . Hypokalemia 05/28/2016  . Port catheter in place 05/07/2016  . Dehydration 05/07/2016  . Encounter for  antineoplastic chemotherapy 04/11/2016  . Tachycardia 02/12/2016  . Chronic pain 12/21/2015  . Dysphagia 12/07/2015  . Palliative care encounter   . DNR (do not resuscitate) 06/26/2015  . Sinus tachycardia ([place]) 06/26/2015  . Encounter for antineoplastic immunotherapy 05/22/2015  . COPD exacerbation ([place]) 04/22/2015  . Fatigue 02/28/2015  . Chronic cough 02/06/2014  . Community acquired pneumonia 10/09/2012  . Primary cancer of right upper lobe of lung ([place]) 02/06/2012  . COPD, moderate ([place]) 11/18/2011  . Dyspnea 11/04/2011    Past Surgical History:  Procedure Laterality Date  . appendex  1962  . surgery on right wrist    . VIDEO BRONCHOSCOPY  01/28/2012   Procedure: VIDEO BRONCHOSCOPY WITHOUT FLUORO;  Surgeon: Brand Males, MD;  Location: [facility] ENDOSCOPY;  Service: Endoscopy;  Laterality: Bilateral;    OB History    No data available       Home Medications    Prior to Admission medications   Medication Sig Start Date End Date Taking? Authorizing Provider  albuterol (PROAIR HFA) 108 (90 BASE) MCG/ACT inhaler Inhale 2 puffs into the lungs every 6 (six) hours as needed for wheezing or shortness of breath. 08/31/15  Yes Brand Males, MD  benzonatate (TESSALON) 100 MG capsule Take 100 mg by mouth every 8 (eight) hours as needed for cough.  01/05/16  Yes Historical Provider, MD  budesonide (PULMICORT) 0.25 MG/2ML nebulizer solution Take 2 mLs (0.25 mg total) by nebulization 2 (two) times daily. Patient  taking differently: Take 0.25 mg by nebulization 3 (three) times daily.  08/20/15  Yes Belkys A Regalado, MD  calcium-vitamin D (OSCAL WITH D) 500-200 MG-UNIT per tablet Take 1 tablet by mouth daily.   Yes Historical Provider, MD  Cholecalciferol (VITAMIN D PO) Take 1 tablet by mouth daily.   Yes Historical Provider, MD  dexamethasone (DECADRON) 4 MG tablet Take 4-12 mg by mouth as directed. Instructions: Every 3 weeks: Take 2 tablets the day before chemo, Take 3 tablets the day  of chemo, Take 3 tablets the day after chemo. 04/09/16  Yes Historical Provider, MD  diltiazem (DILACOR XR) 180 MG 24 hr capsule Take 180 mg by mouth every evening.    Yes Historical Provider, MD  folic acid (FOLVITE) 878 MCG tablet Take 400 mcg by mouth daily.   Yes Historical Provider, MD  furosemide (LASIX) 20 MG tablet Take 20 mg by mouth daily.  04/30/16  Yes Historical Provider, MD  guaiFENesin (MUCINEX) 600 MG 12 hr tablet Take 1 tablet (600 mg total) by mouth 2 (two) times daily. 06/27/15  Yes Reyne Dumas, MD  HYDROcodone-acetaminophen (NORCO/VICODIN) 5-325 MG tablet Take 1 tablet by mouth every 4 (four) hours as needed for moderate pain. 05/23/16  Yes Curt Bears, MD  magnesium oxide (MAG-OX) 400 (241.3 Mg) MG tablet Take 1 tablet (400 mg total) by mouth 2 (two) times daily. 02/18/16  Yes Maryann Mikhail, DO  metoCLOPramide (REGLAN) 10 MG tablet Take 10 mg by mouth every 6 (six) hours as needed for nausea or vomiting.  03/29/16  Yes Historical Provider, MD  morphine (MS CONTIN) 15 MG 12 hr tablet Take 1 tablet (15 mg total) by mouth every 12 (twelve) hours. 05/23/16  Yes Curt Bears, MD  ondansetron (ZOFRAN-ODT) 8 MG disintegrating tablet DISSOLVE 1 TABLET BY MOUTH EVERY 8 HOURS AS NEEDED FOR NAUSEA AND VOMITING 04/04/16  Yes Curt Bears, MD  OXYGEN Inhale 2 L into the lungs continuous.   Yes Historical Provider, MD  polyethylene glycol (MIRALAX / GLYCOLAX) packet MX AND DRK 1 PACKET PO QD MIXED WITH 8 OUNCES OF FLUID 02/22/16  Yes Historical Provider, MD  potassium chloride SA (K-DUR,KLOR-CON) 20 MEQ tablet Take 1 tablet (20 mEq total) by mouth daily. 05/16/16  Yes Wyatt Portela, MD  predniSONE (DELTASONE) 5 MG tablet Take 5 mg by mouth daily. 02/28/16  Yes Historical Provider, MD  simvastatin (ZOCOR) 5 MG tablet Take 10 mg by mouth at bedtime.    Yes Historical Provider, MD    Family History Family History  Problem Relation Age of Onset  . Diabetes Mother     insulin dependent  .  Breast cancer Mother     Social History Social History  Substance Use Topics  . Smoking status: Former Smoker    Packs/day: 0.50    Years: 40.00    Types: Cigarettes    Quit date: 10/29/2009  . Smokeless tobacco: Never Used  . Alcohol use No     Allergies   Shellfish allergy   Review of Systems Review of Systems  Constitutional: Positive for fatigue. Negative for chills and fever.  HENT: Negative for congestion and rhinorrhea.   Eyes: Negative for visual disturbance.  Respiratory: Negative for cough, shortness of breath and wheezing.   Cardiovascular: Negative for chest pain and leg swelling.  Gastrointestinal: Positive for nausea and vomiting. Negative for abdominal pain and diarrhea.  Genitourinary: Negative for dysuria and flank pain.  Musculoskeletal: Positive for arthralgias. Negative for neck pain and neck  stiffness.  Skin: Negative for rash and wound.  Allergic/Immunologic: Negative for immunocompromised state.  Neurological: Negative for syncope, weakness and headaches.     Physical Exam Updated Vital Signs BP (!) 149/66 (BP Location: Right Arm)   Pulse (!) 115   Temp 97.7 F (36.5 C) (Oral)   Resp 20   Ht '5\' 5"'$  (1.651 m)   Wt 106 lb 4.8 oz (48.2 kg)   SpO2 100%   BMI 17.69 kg/m   Physical Exam  Constitutional: She is oriented to person, place, and time. She appears well-developed.  Chronically ill appearing  HENT:  Head: Normocephalic and atraumatic.  Dry mucous membranes  Eyes: Conjunctivae are normal. Pupils are equal, round, and reactive to light.  Neck: Neck supple.  Cardiovascular: Normal rate, regular rhythm and normal heart sounds.  Exam reveals no friction rub.   No murmur heard. Pulmonary/Chest: Effort normal. No respiratory distress. She has wheezes. She has no rales.  Abdominal: Soft. She exhibits no distension. There is no tenderness.  Musculoskeletal: She exhibits no edema.  Neurological: She is alert and oriented to person, place, and  time. She exhibits normal muscle tone.  Skin: Skin is warm. Capillary refill takes less than 2 seconds.  Nursing note and vitals reviewed.    ED Treatments / Results  Labs (all labs ordered are listed, but only abnormal results are displayed) Labs Reviewed  COMPREHENSIVE METABOLIC PANEL - Abnormal; Notable for the following:       Result Value   Potassium 3.0 (*)    Chloride 100 (*)    Calcium 8.7 (*)    Albumin 3.4 (*)    AST 14 (*)    ALT 11 (*)    All other components within normal limits  CBC - Abnormal; Notable for the following:    WBC 12.1 (*)    RBC 3.67 (*)    Hemoglobin 9.9 (*)    HCT 29.9 (*)    RDW 17.0 (*)    Platelets 477 (*)    All other components within normal limits  BASIC METABOLIC PANEL - Abnormal; Notable for the following:    Calcium 7.6 (*)    All other components within normal limits  CBC - Abnormal; Notable for the following:    RBC 3.04 (*)    Hemoglobin 8.1 (*)    HCT 24.6 (*)    RDW 17.3 (*)    All other components within normal limits  LIPASE, BLOOD  I-STAT CG4 LACTIC ACID, ED    EKG  EKG Interpretation  Date/Time:  Tuesday May 28 2016 14:29:29 EDT Ventricular Rate:  128 PR Interval:    QRS Duration: 79 QT Interval:  327 QTC Calculation: 478 R Axis:   92 Text Interpretation:  Sinus tachycardia Right axis deviation Consider left ventricular hypertrophy Borderline T abnormality No significant change since last tracing Tachycardia persists Confirmed by Adeola Dennen MD, Lysbeth Galas [phone]) on 05/28/2016 2:49:12 PM       Radiology Dg Chest 2 View  Result Date: 05/28/2016 CLINICAL DATA:  SOB increasing x 2 days - currently being treated for lung cancer - hx COPD - hx asthma EXAM: CHEST  2 VIEW COMPARISON:  05/07/2016 FINDINGS: The cardiac silhouette is normal in size. No mediastinal masses or convincing adenopathy. Coarse reticular type opacity extends from superior left hilum. There is fullness from the inferior right hilum. These findings  stable. Lungs are hyperexpanded but otherwise clear. No pleural effusion or pneumothorax. Right anterior chest wall Port-A-Cath is stable. Bony thorax  is demineralized. Mild compression fracture of a mid to upper thoracic vertebra, also stable. No convincing osteoblastic or osteolytic lesions. IMPRESSION: No acute cardiopulmonary disease. No significant change from the previous chest radiograph. Electronically Signed   By: Lajean Manes M.D.   On: 05/28/2016 14:49    Procedures Procedures (including critical care time)  Medications Ordered in ED Medications  HYDROcodone-acetaminophen (NORCO/VICODIN) 5-325 MG per tablet 1 tablet (not administered)  morphine (MS CONTIN) 12 hr tablet 15 mg (15 mg Oral Given 05/29/16 0908)  magnesium oxide (MAG-OX) tablet 400 mg (400 mg Oral Not Given 05/29/16 1105)  budesonide (PULMICORT) nebulizer solution 0.25 mg (0.25 mg Nebulization Given 05/29/16 0840)  ondansetron (ZOFRAN) injection 4 mg (not administered)  enoxaparin (LOVENOX) injection 40 mg (40 mg Subcutaneous Given 05/28/16 1946)  sodium chloride flush (NS) 0.9 % injection 3 mL (3 mLs Intravenous Given 05/29/16 0911)  0.9 %  sodium chloride infusion ( Intravenous New Bag/Given 05/29/16 1034)  morphine 2 MG/ML injection 2 mg (2 mg Intravenous Given 05/29/16 0909)  sodium chloride flush (NS) 0.9 % injection 10-40 mL (not administered)  albuterol (PROVENTIL) (2.5 MG/3ML) 0.083% nebulizer solution 2.5 mg (not administered)  levalbuterol (XOPENEX) nebulizer solution 0.63 mg (0.63 mg Nebulization Given 05/29/16 0834)  methylPREDNISolone sodium succinate (SOLU-MEDROL) 40 mg/mL injection 10 mg (not administered)  guaiFENesin (ROBITUSSIN) 100 MG/5ML solution 400 mg (not administered)  famotidine (PEPCID) IVPB 20 mg premix (not administered)  diltiazem (CARDIZEM) injection 5 mg (not administered)  sodium chloride 0.9 % bolus 1,000 mL (0 mLs Intravenous Stopped 05/28/16 1555)  albuterol (PROVENTIL) (2.5 MG/3ML) 0.083% nebulizer  solution 5 mg (5 mg Nebulization Given 05/28/16 1456)  ondansetron (ZOFRAN) injection 4 mg (4 mg Intravenous Given 05/28/16 1449)  morphine 4 MG/ML injection 4 mg (4 mg Intravenous Given 05/28/16 1449)  ipratropium (ATROVENT) nebulizer solution 0.5 mg (0.5 mg Nebulization Given 05/28/16 1456)  sodium chloride 0.9 % bolus 1,000 mL (1,000 mLs Intravenous New Bag/Given 05/28/16 1551)  albuterol (PROVENTIL) (2.5 MG/3ML) 0.083% nebulizer solution (  Duplicate 04/27/92 8101)     Initial Impression / Assessment and Plan / ED Course  I have reviewed the triage vital signs and the nursing notes.  Pertinent labs & imaging results that were available during my care of the patient were reviewed by me and considered in my medical decision making (see chart for details).  Clinical Course    75 yo F with PMHx of NSCLC on chemo, COPD who p/w persistent nausea and vomiting. This occurred in the setting of chemo and Neulasta injection, which is usual for her. Unable to control sx at home. On arrival, pt tachycardic but otherwise HDS. Mildly dehydrated on exam. Will start IVF, admit to medicine. Labwork shows mild hypokalemia but is o/w unremarkable. EKG nonischemic. Abdomen otherwise soft without signs of peritonitis or obstruction. Admitted in stable condition.  Final Clinical Impressions(s) / ED Diagnoses   Final diagnoses:  Dehydration  Intractable vomiting with nausea, vomiting of unspecified type  Hypokalemia    New Prescriptions Current Discharge Medication List       Duffy Bruce, MD 05/29/16 1123

## 2016-05-28 NOTE — ED Triage Notes (Signed)
Pt received chemotherapy on Thursday, received injection to increase WBC count on Saturday, and began feeling nausea and emesis on Sunday, unable to tolerate any PO intake. Seen in ED yesterday for the same, initially felt better but emesis returned within 15 minutes of leaving ED. Home prescription for Zofran inadequate.

## 2016-05-29 DIAGNOSIS — T451X5A Adverse effect of antineoplastic and immunosuppressive drugs, initial encounter: Secondary | ICD-10-CM | POA: Diagnosis present

## 2016-05-29 DIAGNOSIS — E876 Hypokalemia: Secondary | ICD-10-CM

## 2016-05-29 DIAGNOSIS — D6481 Anemia due to antineoplastic chemotherapy: Secondary | ICD-10-CM

## 2016-05-29 DIAGNOSIS — J9611 Chronic respiratory failure with hypoxia: Secondary | ICD-10-CM

## 2016-05-29 DIAGNOSIS — K521 Toxic gastroenteritis and colitis: Secondary | ICD-10-CM | POA: Diagnosis present

## 2016-05-29 DIAGNOSIS — J449 Chronic obstructive pulmonary disease, unspecified: Secondary | ICD-10-CM

## 2016-05-29 LAB — CBC
HEMATOCRIT: 24.6 % — AB (ref 36.0–46.0)
HEMOGLOBIN: 8.1 g/dL — AB (ref 12.0–15.0)
MCH: 26.6 pg (ref 26.0–34.0)
MCHC: 32.9 g/dL (ref 30.0–36.0)
MCV: 80.9 fL (ref 78.0–100.0)
Platelets: 394 10*3/uL (ref 150–400)
RBC: 3.04 MIL/uL — AB (ref 3.87–5.11)
RDW: 17.3 % — ABNORMAL HIGH (ref 11.5–15.5)
WBC: 4.7 10*3/uL (ref 4.0–10.5)

## 2016-05-29 LAB — URINE CULTURE

## 2016-05-29 LAB — BASIC METABOLIC PANEL
ANION GAP: 6 (ref 5–15)
BUN: 6 mg/dL (ref 6–20)
CALCIUM: 7.6 mg/dL — AB (ref 8.9–10.3)
CO2: 27 mmol/L (ref 22–32)
Chloride: 103 mmol/L (ref 101–111)
Creatinine, Ser: 0.53 mg/dL (ref 0.44–1.00)
GLUCOSE: 72 mg/dL (ref 65–99)
POTASSIUM: 4.5 mmol/L (ref 3.5–5.1)
Sodium: 136 mmol/L (ref 135–145)

## 2016-05-29 MED ORDER — FAMOTIDINE IN NACL 20-0.9 MG/50ML-% IV SOLN
20.0000 mg | Freq: Two times a day (BID) | INTRAVENOUS | Status: DC
  Administered 2016-05-29 – 2016-05-30 (×4): 20 mg via INTRAVENOUS
  Filled 2016-05-29 (×4): qty 50

## 2016-05-29 MED ORDER — GUAIFENESIN 100 MG/5ML PO SOLN
20.0000 mL | Freq: Two times a day (BID) | ORAL | Status: DC
  Administered 2016-05-29 – 2016-05-30 (×3): 400 mg via ORAL
  Filled 2016-05-29 (×4): qty 10

## 2016-05-29 MED ORDER — BOOST / RESOURCE BREEZE PO LIQD
1.0000 | Freq: Two times a day (BID) | ORAL | Status: DC
  Administered 2016-05-29 – 2016-06-01 (×6): 1 via ORAL

## 2016-05-29 MED ORDER — DILTIAZEM HCL 25 MG/5ML IV SOLN
5.0000 mg | Freq: Four times a day (QID) | INTRAVENOUS | Status: DC | PRN
  Administered 2016-05-31: 5 mg via INTRAVENOUS
  Filled 2016-05-29 (×3): qty 5

## 2016-05-29 MED ORDER — GUAIFENESIN 100 MG/5ML PO SOLN: 30.0000 mL | ORAL | Status: DC | PRN

## 2016-05-29 MED ORDER — HYDROCORTISONE NA SUCCINATE PF 100 MG IJ SOLR: 20.0000 mg | Freq: Every day | INTRAMUSCULAR | Status: DC

## 2016-05-29 MED ORDER — METHYLPREDNISOLONE SODIUM SUCC 40 MG IJ SOLR
10.0000 mg | Freq: Every day | INTRAMUSCULAR | Status: DC
  Administered 2016-05-29 – 2016-05-30 (×2): 10 mg via INTRAVENOUS
  Filled 2016-05-29 (×2): qty 1

## 2016-05-29 NOTE — Progress Notes (Signed)
Initial Nutrition Assessment  DOCUMENTATION CODES:   Severe malnutrition in context of acute illness/injury, Underweight  INTERVENTION:  - Will order Boost Breeze po TID, each supplement provides 250 kcal and 9 grams of protein - Continue to encourage PO intakes of liquids, supplements. - Diet advancement as medically feasible. - RD will continue to monitor for additional needs.  NUTRITION DIAGNOSIS:   Inadequate protein intake related to other (see comment) (current diet order ) as evidenced by other (see comment) (CLD does not meet estimated protein needs.).  GOAL:   Patient will meet greater than or equal to 90% of their needs  MONITOR:   PO intake, Supplement acceptance, Diet advancement, Weight trends, Labs, I & O's  REASON FOR ASSESSMENT:   Malnutrition Screening Tool  ASSESSMENT:   75 y.o. female with a past medical history metastatic non-small cell lung cancer diagnosed in March 2013 status post palliative radiation therapy to left lung mass, systemic therapy with carboplatin and Abraxane, also receiving immunotherapy with Nivolumab in 2016, currently receiving chemotherapy with Docetaxel and Cyramza every 3 weeks with Neulasta support, under the care of Dr. Julien Nordmann. She was last seen by Dr. Julien Nordmann on 05/23/2016. She received her last infusion with chemotherapy on 05/23/2016 at the cancer center then got a dose of Neulasta last Saturday. Then on Sunday she developed intractible nausea and vomiting presenting to the ED on 05/27/2016 where she received IV fluids, IV antiemetic therapy and discharged home.  She reports in the last 24 hours unable to keep even her medications down which has led to uncontrolled pain symptoms (unable to take Norco and MS Contin).   Pt seen for MST. BMI indicates underweight status. No intakes documented. Pt states she was able to consume beef broth and jello for lunch with no abdominal pain, N/V. Pt requested RD order dinner tray for 1945 arrival:  beef broth with salt packets, hot tea with sugar. Pt states that since admission N/V has resolved. She states that it began on 8/6 and that she had an episode similar to this in March or April of this year. Pt states that she has had taste alteration of very bland taste while being on chemo; discussed some tips to try after d/c to assist with this.   Pt states that at home she has Boost and Ensure available and has prescribed Zofran. When N/V began on 8/6 the Zofran did not help and she was unable to keep anything down, including the Ensure and Boost she tried to drink that day.   Physical assessment shows mild fat wasting, moderate to severe muscle wasting. Per chart review, pt has lost 13 lbs (11% body weight) in the past 2 months which is significant for time frame.   Will order Boost Breeze while pt is on CLD and change to Ensure with diet advancement. Pt denies any questions or concerns at this time.   Medications reviewed; 400 mg Mag-ox BID, 10 mg IV Solu-medrol/day, PRN Zofran. Labs reviewed; Ca: 7.6 mg/dL. IVF: NS @ 125 mL/hr.    Diet Order:  Diet clear liquid Room service appropriate? Yes; Fluid consistency: Thin  Skin:  Reviewed, no issues  Last BM:  8/8  Height:   Ht Readings from Last 1 Encounters:  05/28/16 '5\' 5"'$  (1.651 m)    Weight:   Wt Readings from Last 1 Encounters:  05/28/16 106 lb 4.8 oz (48.2 kg)    Ideal Body Weight:  56.82 kg  BMI:  Body mass index is 17.69 kg/m.  Estimated Nutritional Needs:   Kcal:  1450-1690 (30-35 kcal/kg)  Protein:  70-80 grams  Fluid:  >/= 1.7 L/day  EDUCATION NEEDS:   No education needs identified at this time    Jarome Matin, MS, RD, LDN Inpatient Clinical Dietitian Pager # [phone] After hours/weekend pager # [phone]

## 2016-05-29 NOTE — Progress Notes (Signed)
Progress Note    Destiny Harrison  NTZ:[id number] DOB: 10-18-1941  DOA: 05/28/2016 PCP: Thressa Sheller, MD    Brief Narrative:   Destiny Harrison is an 75 y.o. female with a past medical history metastatic non-small cell lung cancer diagnosed in March 2013 status post palliative radiation therapy to left lung mass, systemic therapy with carboplatin and Abraxane, also receiving immunotherapy with Nivolumab in 2016, currently receiving chemotherapy with Docetaxel and Cyramza every 3 weeks with Neulasta support, under the care of Dr. Julien Nordmann, Who was admitted 05/28/16 with a chief complaint of nausea/vomiting. Her last dose of chemotherapy was 05/23/16. She was evaluated in the ED on 05/27/16 where she received IV fluids and anti-medics and was discharged home.  Assessment/Plan:   Principal Problem:   Chemotherapy-induced enteritis/Nausea and vomiting/dehydration Continue supportive management with IV fluids and anti-emetics. No evidence of obstruction.Continues to be very symptomatic with ongoing vomiting. Add IV Pepcid twice a day.  Active Problems:   Anti-neoplastic chemotherapy-induced anemia Monitor hemoglobin. No current indication for transfusion.    Hypertension  Cardizem currently on hold. Will add IV Cardizem as needed for elevated heart rate and blood pressure.    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 Continue Pulmicort and bronchodilators as needed. Change prednisone to IV Solu-Medrol 10 mg daily. Continue supplemental oxygen.    Primary cancer of right upper lobe of lung (HCC)/non-small cell lung cancer She currently receives her care at the [facility], follows Dr. Julien Nordmann. Currently receiving chemotherapy with Docetaxel and Cyramza every 3 weeks with Neulasta support, last infusion on 05/23/2016. Her oncologist was notified of her admission.    ? UTI Placed on IV ceftriaxone. No complaints of dysuria or UTI symptoms, D/C.    Hypokalemia Secondary to GI  losses. Monitor and replace as needed.   Family Communication/Anticipated D/C date and plan/Code Status   DVT prophylaxis: Lovenox ordered. Code Status: DO NOT RESUSCITATE Family Communication: No family currently at the bedside. Disposition Plan: Home when nausea/vomiting resolved.   Medical Consultants:    Oncology   Procedures:    None.  Anti-Infectives:   Rocephin 05/28/16---> 05/29/16   Subjective:   Destiny Harrison continues to report active nausea/vomiting. Vomited after attempting to take in clear liquids. Has not been able to keep anything solid down for the past 3 days. Also has some chest pains associated with episodes of nausea and vomiting.  Objective:    Vitals:   05/28/16 1715 05/28/16 1746 05/28/16 2123 05/29/16 0610  BP: 138/62 (!) 152/71 136/65 (!) 149/66  Pulse: (!) 122 (!) 127 (!) 118 (!) 115  Resp: 24 (!) '25 20 20  '$ Temp:  98.4 F (36.9 C) 98.7 F (37.1 C) 97.7 F (36.5 C)  TempSrc:  Oral Oral Oral  SpO2: 100% 99% 100% 100%  Weight:  48.2 kg (106 lb 4.8 oz)    Height:  '5\' 5"'$  (1.651 m)      Intake/Output Summary (Last 24 hours) at 05/29/16 0815 Last data filed at 05/29/16 6759  Gross per 24 hour  Intake          1520.83 ml  Output             1550 ml  Net           -29.17 ml   Filed Weights   05/28/16 1500 05/28/16 1746  Weight: 52.2 kg (115 lb) 48.2 kg (106 lb 4.8 oz)    Exam: General exam: Appears calm, But has increased work of breathing.  Respiratory system: Scattered rhonchi and wheezes. Respiratory effort increased. Cardiovascular system: S1 & S2 heard, RRR. No JVD,  rubs, gallops or clicks. No murmurs. Gastrointestinal system: Abdomen is nondistended, soft and nontender. No organomegaly or masses felt. Normal bowel sounds heard. Central nervous system: Alert and oriented. No focal neurological deficits. Extremities: No clubbing,  or cyanosis. No edema. Skin: No rashes, lesions or ulcers. Psychiatry: Judgement and insight appear  normal. Mood & affect appropriate.   Data Reviewed:   I have personally reviewed following labs and imaging studies:  Labs: Basic Metabolic Panel:  Recent Labs Lab 05/23/16 1013  05/27/16 1544 05/28/16 1451 05/29/16 0425  NA 135*  --  139 138 136  K 4.4  < > 2.9* 3.0* 4.5  CL  --   --  102 100* 103  CO2 25  --  '28 29 27  '$ GLUCOSE 295*  --  86 93 72  BUN 10.1  --  '15 10 6  '$ CREATININE 0.8  --  0.55 0.64 0.53  CALCIUM 9.4  --  8.1* 8.7* 7.6*  < > = values in this interval not displayed. GFR Estimated Creatinine Clearance: 45.5 mL/min (by C-G formula based on SCr of 0.8 mg/dL). Liver Function Tests:  Recent Labs Lab 05/23/16 1013 05/27/16 1544 05/28/16 1451  AST 9 14* 14*  ALT <9 12* 11*  ALKPHOS 87 69 89  BILITOT 0.47 0.8 1.1  PROT 6.8 5.8* 6.8  ALBUMIN 2.4* 2.9* 3.4*    Recent Labs Lab 05/27/16 1544 05/28/16 1451  LIPASE 26 18   CBC:  Recent Labs Lab 05/23/16 1013 05/27/16 1544 05/28/16 1451 05/29/16 0425  WBC 10.6* 28.7* 12.1* 4.7  NEUTROABS 10.0* 27.2*  --   --   HGB 9.5* 8.6* 9.9* 8.1*  HCT 29.3* 26.2* 29.9* 24.6*  MCV 81.7 81.6 81.5 80.9  PLT 789* 505* 477* 394    Sepsis Labs:  Recent Labs Lab 05/23/16 1013 05/27/16 1544 05/28/16 1451 05/28/16 1504 05/29/16 0425  WBC 10.6* 28.7* 12.1*  --  4.7  LATICACIDVEN  --   --   --  1.82  --    Urine analysis:    Component Value Date/Time   COLORURINE YELLOW 05/27/2016 [place] 05/27/2016 1659   LABSPEC 1.011 05/27/2016 1659   PHURINE 7.0 05/27/2016 1659   GLUCOSEU NEGATIVE 05/27/2016 1659   HGBUR NEGATIVE 05/27/2016 1659   BILIRUBINUR NEGATIVE 05/27/2016 1659   KETONESUR NEGATIVE 05/27/2016 1659   PROTEINUR NEGATIVE 05/27/2016 1659   UROBILINOGEN 0.2 08/18/2015 0730   NITRITE NEGATIVE 05/27/2016 1659   LEUKOCYTESUR SMALL (A) 05/27/2016 1659   Microbiology No results found for this or any previous visit (from the past 240 hour(s)).  Radiology: Dg Chest 2  View  Result Date: 05/28/2016 CLINICAL DATA:  SOB increasing x 2 days - currently being treated for lung cancer - hx COPD - hx asthma EXAM: CHEST  2 VIEW COMPARISON:  05/07/2016 FINDINGS: The cardiac silhouette is normal in size. No mediastinal masses or convincing adenopathy. Coarse reticular type opacity extends from superior left hilum. There is fullness from the inferior right hilum. These findings stable. Lungs are hyperexpanded but otherwise clear. No pleural effusion or pneumothorax. Right anterior chest wall Port-A-Cath is stable. Bony thorax is demineralized. Mild compression fracture of a mid to upper thoracic vertebra, also stable. No convincing osteoblastic or osteolytic lesions. IMPRESSION: No acute cardiopulmonary disease. No significant change from the previous chest radiograph. Electronically Signed   By: Dedra Skeens.D.  On: 05/28/2016 14:49    Medications:   . budesonide  0.25 mg Nebulization BID  . calcium-vitamin D  1 tablet Oral Daily  . cefTRIAXone (ROCEPHIN)  IV  1 g Intravenous Q24H  . enoxaparin (LOVENOX) injection  40 mg Subcutaneous Q24H  . guaiFENesin  600 mg Oral BID  . levalbuterol  0.63 mg Nebulization TID  . magnesium oxide  400 mg Oral BID  . morphine  15 mg Oral Q12H  . predniSONE  5 mg Oral Daily  . simvastatin  10 mg Oral QHS  . sodium chloride flush  3 mL Intravenous Q12H   Continuous Infusions: . sodium chloride 125 mL/hr at 05/28/16 1750    Time spent: 25 minutes.   LOS: 1 day   Destiny Harrison  [facility] Pager [phone]. If unable to reach me by pager, please call my cell phone at [phone].  *Please refer to [email or web address], password [email or web address] to get updated schedule on who will round on this patient, as hospitalists switch teams weekly. If 7PM-7AM, please contact night-coverage at [email or web address], password [email or web address] for any overnight needs.  05/29/2016, 8:15 AM

## 2016-05-30 DIAGNOSIS — E43 Unspecified severe protein-calorie malnutrition: Secondary | ICD-10-CM | POA: Diagnosis present

## 2016-05-30 LAB — BASIC METABOLIC PANEL
ANION GAP: 6 (ref 5–15)
BUN: 5 mg/dL — ABNORMAL LOW (ref 6–20)
CHLORIDE: 104 mmol/L (ref 101–111)
CO2: 27 mmol/L (ref 22–32)
Calcium: 7.4 mg/dL — ABNORMAL LOW (ref 8.9–10.3)
Creatinine, Ser: 0.58 mg/dL (ref 0.44–1.00)
GFR calc non Af Amer: >60 mL/min (ref >60)
Glucose, Bld: 91 mg/dL (ref 65–99)
POTASSIUM: 3.7 mmol/L (ref 3.5–5.1)
SODIUM: 137 mmol/L (ref 135–145)

## 2016-05-30 LAB — CBC
HEMATOCRIT: 24.4 % — AB (ref 36.0–46.0)
HEMOGLOBIN: 7.9 g/dL — AB (ref 12.0–15.0)
MCH: 26 pg (ref 26.0–34.0)
MCHC: 32.4 g/dL (ref 30.0–36.0)
MCV: 80.3 fL (ref 78.0–100.0)
Platelets: 371 10*3/uL (ref 150–400)
RBC: 3.04 MIL/uL — AB (ref 3.87–5.11)
RDW: 17 % — ABNORMAL HIGH (ref 11.5–15.5)
WBC: 11.4 10*3/uL — AB (ref 4.0–10.5)

## 2016-05-30 MED ORDER — GUAIFENESIN ER 600 MG PO TB12
600.0000 mg | ORAL_TABLET | Freq: Two times a day (BID) | ORAL | Status: DC
  Administered 2016-05-30 – 2016-06-02 (×6): 600 mg via ORAL
  Filled 2016-05-30 (×6): qty 1

## 2016-05-30 MED ORDER — DILTIAZEM HCL ER 180 MG PO CP24
180.0000 mg | ORAL_CAPSULE | Freq: Every evening | ORAL | Status: DC
  Administered 2016-05-30 – 2016-06-02 (×4): 180 mg via ORAL
  Filled 2016-05-30 (×8): qty 1

## 2016-05-30 NOTE — Evaluation (Signed)
Physical Therapy Evaluation Patient Details Name: Sloan Galentine MRN: [id number] DOB: 1941-06-13 Today's Date: 05/30/2016   History of Present Illness  75 y.o. female with a past medical history metastatic non-small cell lung cancer diagnosed in March 2013, COPD, RA and admitted for nausea/vomiting and Chemotherapy-induced enteritis  Clinical Impression  Pt admitted with above diagnosis. Pt currently with functional limitations due to the deficits listed below (see PT Problem List).  Pt will benefit from skilled PT to increase their independence and safety with mobility to allow discharge to the venue listed below.  Pt agreeable to ambulate as tolerated and remained on 2L O2 [place].  Pt reports she has equipment at home and lives with spouse.  Her children frequently check in and assist as needed.       Follow Up Recommendations No PT follow up    Equipment Recommendations  None recommended by PT    Recommendations for Other Services       Precautions / Restrictions Precautions Precaution Comments: monitor HR, chronic 2L O2      Mobility  Bed Mobility Overal bed mobility: Modified Independent                Transfers Overall transfer level: Needs assistance Equipment used: None Transfers: Sit to/from Stand Sit to Stand: Min guard            Ambulation/Gait Ambulation/Gait assistance: Min guard Ambulation Distance (Feet): 160 Feet Assistive device: None Gait Pattern/deviations: Step-through pattern;Decreased stride length     General Gait Details: occasional short standing rest break, HR 128 bpm during ambulation, pt ambulated to tolerance, no unsteadiness observed  Stairs            Wheelchair Mobility    Modified Rankin (Stroke Patients Only)       Balance Overall balance assessment:  (denies falls)                                           Pertinent Vitals/Pain Pain Assessment: 0-10 Pain Score: 5  Pain Location: pt just  reports chronic pain Pain Intervention(s): Patient requesting pain meds-RN notified;Monitored during session;Limited activity within patient's tolerance    Home Living Family/patient expects to be discharged to:: Private residence Living Arrangements: Spouse/significant other Available Help at Discharge: Family Type of Home: House       Home Layout: One level Home Equipment: Environmental consultant - 2 wheels;Cane - single point;Bedside commode      Prior Function Level of Independence: Independent               Hand Dominance        Extremity/Trunk Assessment               Lower Extremity Assessment: Generalized weakness         Communication   Communication: No difficulties  Cognition Arousal/Alertness: Awake/alert Behavior During Therapy: WFL for tasks assessed/performed Overall Cognitive Status: Within Functional Limits for tasks assessed                      General Comments      Exercises        Assessment/Plan    PT Assessment Patient needs continued PT services  PT Diagnosis Difficulty walking;Generalized weakness   PT Problem List Decreased strength;Decreased activity tolerance;Cardiopulmonary status limiting activity;Decreased mobility  PT Treatment Interventions DME instruction;Gait training;Functional mobility training;Therapeutic exercise;Therapeutic activities;Patient/family education  PT Goals (Current goals can be found in the Care Plan section) Acute Rehab PT Goals PT Goal Formulation: With patient Time For Goal Achievement: 06/06/16 Potential to Achieve Goals: Good    Frequency Min 3X/week   Barriers to discharge        Co-evaluation               End of Session Equipment Utilized During Treatment: Oxygen Activity Tolerance: Patient limited by fatigue Patient left: in chair;with call bell/phone within reach Nurse Communication: Mobility status         Time: 1329-1340 PT Time Calculation (min) (ACUTE ONLY): 11  min   Charges:   PT Evaluation $PT Eval Low Complexity: 1 Procedure     PT G Codes:        Ziaire Hagos,KATHrine E 05/30/2016, 3:08 PM Carmelia Bake, PT, DPT 05/30/2016 Pager: [phone]

## 2016-05-30 NOTE — Progress Notes (Signed)
Progress Note    Destiny Harrison  LZJ:[id number] DOB: [birth year]  DOA: 05/28/2016 PCP: Thressa Sheller, MD    Brief Narrative:   Destiny Harrison is an 75 y.o. female with a past medical history metastatic non-small cell lung cancer diagnosed in March 2013 status post palliative radiation therapy to left lung mass, systemic therapy with carboplatin and Abraxane, also receiving immunotherapy with Nivolumab in 2016, currently receiving chemotherapy with Docetaxel and Cyramza every 3 weeks with Neulasta support, under the care of Dr. Julien Nordmann, Who was admitted 05/28/16 with a chief complaint of nausea/vomiting. Her last dose of chemotherapy was 05/23/16. She was evaluated in the ED on 05/27/16 where she received IV fluids and anti-medics and was discharged home.  Assessment/Plan:   Principal Problem:   Chemotherapy-induced enteritis/Nausea and vomiting/dehydration Continue supportive management with IV fluids and anti-emetics. No evidence of obstruction.Improved over the past 24 hours. Continue IV Pepcid twice a day. Diet advanced to full liquids. Advance further as tolerated.  Active Problems:   Severe protein calorie malnutrition Evaluated by dietitian 05/29/16. Supplements ordered.    Anti-neoplastic chemotherapy-induced anemia Monitor hemoglobin. Currently 7.9 mg/dL. No absolute indication for transfusion, but could consider if she becomes symptomatic or hemoglobin drops less than 7.    Hypertension  Resume oral dose of Cardizem.    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 Continue Pulmicort and bronchodilators as needed. Continue IV Solu-Medrol 10 mg daily. Continue supplemental oxygen.    Primary cancer of right upper lobe of lung (HCC)/non-small cell lung cancer She currently receives her care at the [facility], follows Dr. Julien Nordmann. Currently receiving chemotherapy with Docetaxel and Cyramza every 3 weeks with Neulasta support, last infusion on 05/23/2016. Her oncologist  was notified of her admission.    ? UTI Placed on IV ceftriaxone on admission. No complaints of dysuria or UTI symptoms, so empiric antibiotics were discontinued 05/29/16.    Hypokalemia Secondary to GI losses. Monitor and replace as needed.   Family Communication/Anticipated D/C date and plan/Code Status   DVT prophylaxis: Lovenox ordered. Code Status: DO NOT RESUSCITATE Family Communication: No family currently at the bedside. Disposition Plan: Home when nausea/vomiting resolved.   Medical Consultants:    Oncology   Procedures:    None.  Anti-Infectives:   Rocephin 05/28/16---> 05/29/16   Subjective:   Destiny Harrison feels better today. She has not had any further nausea, vomiting or diarrhea and has been tolerating a liquid diet. She continues to have chest pain related to her lung cancer.  Objective:    Vitals:   05/29/16 1924 05/29/16 1927 05/29/16 2053 05/30/16 0527  BP:   116/62 (!) 141/68  Pulse:   (!) 114 (!) 108  Resp:   20 18  Temp:   98.3 F (36.8 C) 98.4 F (36.9 C)  TempSrc:   Oral Oral  SpO2: 98% 98% 100% 100%  Weight:      Height:        Intake/Output Summary (Last 24 hours) at 05/30/16 0729 Last data filed at 05/30/16 0600  Gross per 24 hour  Intake             3100 ml  Output             1850 ml  Net             1250 ml   Filed Weights   05/28/16 1500 05/28/16 1746  Weight: 52.2 kg (115 lb) 48.2 kg (106 lb 4.8 oz)  Exam: General exam: Appears calm, But has increased work of breathing. Respiratory system: Scattered rhonchi and wheezes. Respiratory effort increased. Cardiovascular system: S1 & S2 heard, RRR. No JVD,  rubs, gallops or clicks. No murmurs. Gastrointestinal system: Abdomen is nondistended, soft and nontender. No organomegaly or masses felt. Normal bowel sounds heard. Central nervous system: Alert and oriented. No focal neurological deficits. Extremities: No clubbing,  or cyanosis. No edema. Skin: No rashes, lesions or  ulcers. Psychiatry: Judgement and insight appear normal. Mood & affect appropriate.   Data Reviewed:   I have personally reviewed following labs and imaging studies:  Labs: Basic Metabolic Panel:  Recent Labs Lab 05/23/16 1013  05/27/16 1544 05/28/16 1451 05/29/16 0425 05/30/16 0410  NA 135*  --  139 138 136 137  K 4.4  < > 2.9* 3.0* 4.5 3.7  CL  --   --  102 100* 103 104  CO2 25  --  '28 29 27 27  '$ GLUCOSE 295*  --  86 93 72 91  BUN 10.1  --  '15 10 6 '$ 5*  CREATININE 0.8  --  0.55 0.64 0.53 0.58  CALCIUM 9.4  --  8.1* 8.7* 7.6* 7.4*  < > = values in this interval not displayed. GFR Estimated Creatinine Clearance: 45.5 mL/min (by C-G formula based on SCr of 0.8 mg/dL). Liver Function Tests:  Recent Labs Lab 05/23/16 1013 05/27/16 1544 05/28/16 1451  AST 9 14* 14*  ALT <9 12* 11*  ALKPHOS 87 69 89  BILITOT 0.47 0.8 1.1  PROT 6.8 5.8* 6.8  ALBUMIN 2.4* 2.9* 3.4*    Recent Labs Lab 05/27/16 1544 05/28/16 1451  LIPASE 26 18   CBC:  Recent Labs Lab 05/23/16 1013 05/27/16 1544 05/28/16 1451 05/29/16 0425 05/30/16 0410  WBC 10.6* 28.7* 12.1* 4.7 11.4*  NEUTROABS 10.0* 27.2*  --   --   --   HGB 9.5* 8.6* 9.9* 8.1* 7.9*  HCT 29.3* 26.2* 29.9* 24.6* 24.4*  MCV 81.7 81.6 81.5 80.9 80.3  PLT 789* 505* 477* 394 371    Sepsis Labs:  Recent Labs Lab 05/27/16 1544 05/28/16 1451 05/28/16 1504 05/29/16 0425 05/30/16 0410  WBC 28.7* 12.1*  --  4.7 11.4*  LATICACIDVEN  --   --  1.82  --   --    Urine analysis:    Component Value Date/Time   COLORURINE YELLOW 05/27/2016 [place] 05/27/2016 1659   LABSPEC 1.011 05/27/2016 1659   PHURINE 7.0 05/27/2016 1659   GLUCOSEU NEGATIVE 05/27/2016 1659   HGBUR NEGATIVE 05/27/2016 1659   BILIRUBINUR NEGATIVE 05/27/2016 1659   KETONESUR NEGATIVE 05/27/2016 1659   PROTEINUR NEGATIVE 05/27/2016 1659   UROBILINOGEN 0.2 08/18/2015 0730   NITRITE NEGATIVE 05/27/2016 1659   LEUKOCYTESUR SMALL (A)  05/27/2016 1659   Microbiology Recent Results (from the past 240 hour(s))  Urine culture     Status: Abnormal   Collection Time: 05/27/16  5:00 PM  Result Value Ref Range Status   Specimen Description URINE, RANDOM  Final   Special Requests NONE  Final   Culture MULTIPLE SPECIES PRESENT, SUGGEST RECOLLECTION (A)  Final   Report Status 05/29/2016 FINAL  Final    Radiology: Dg Chest 2 View  Result Date: 05/28/2016 CLINICAL DATA:  SOB increasing x 2 days - currently being treated for lung cancer - hx COPD - hx asthma EXAM: CHEST  2 VIEW COMPARISON:  05/07/2016 FINDINGS: The cardiac silhouette is normal in size. No mediastinal masses or convincing  adenopathy. Coarse reticular type opacity extends from superior left hilum. There is fullness from the inferior right hilum. These findings stable. Lungs are hyperexpanded but otherwise clear. No pleural effusion or pneumothorax. Right anterior chest wall Port-A-Cath is stable. Bony thorax is demineralized. Mild compression fracture of a mid to upper thoracic vertebra, also stable. No convincing osteoblastic or osteolytic lesions. IMPRESSION: No acute cardiopulmonary disease. No significant change from the previous chest radiograph. Electronically Signed   By: Lajean Manes M.D.   On: 05/28/2016 14:49    Medications:   . budesonide  0.25 mg Nebulization BID  . enoxaparin (LOVENOX) injection  40 mg Subcutaneous Q24H  . famotidine (PEPCID) IV  20 mg Intravenous Q12H  . feeding supplement  1 Container Oral BID BM  . guaiFENesin  20 mL Oral Q12H  . levalbuterol  0.63 mg Nebulization TID  . magnesium oxide  400 mg Oral BID  . methylPREDNISolone (SOLU-MEDROL) injection  10 mg Intravenous Daily  . morphine  15 mg Oral Q12H  . sodium chloride flush  3 mL Intravenous Q12H   Continuous Infusions: . sodium chloride 125 mL/hr at 05/30/16 0250    Time spent: 25 minutes.   LOS: 2 days   Destiny Harrison  [facility] Pager [phone]. If unable to  reach me by pager, please call my cell phone at [phone].  *Please refer to [email or web address], password [email or web address] to get updated schedule on who will round on this patient, as hospitalists switch teams weekly. If 7PM-7AM, please contact night-coverage at [email or web address], password [email or web address] for any overnight needs.  05/30/2016, 7:29 AM

## 2016-05-31 ENCOUNTER — Inpatient Hospital Stay (HOSPITAL_COMMUNITY): Payer: Medicare Other

## 2016-05-31 DIAGNOSIS — D899 Disorder involving the immune mechanism, unspecified: Secondary | ICD-10-CM

## 2016-05-31 DIAGNOSIS — R6510 Systemic inflammatory response syndrome (SIRS) of non-infectious origin without acute organ dysfunction: Secondary | ICD-10-CM

## 2016-05-31 DIAGNOSIS — D849 Immunodeficiency, unspecified: Secondary | ICD-10-CM

## 2016-05-31 DIAGNOSIS — A419 Sepsis, unspecified organism: Secondary | ICD-10-CM | POA: Diagnosis present

## 2016-05-31 LAB — BASIC METABOLIC PANEL
ANION GAP: 8 (ref 5–15)
BUN: <5 mg/dL — ABNORMAL LOW (ref 6–20)
CHLORIDE: 104 mmol/L (ref 101–111)
CO2: 28 mmol/L (ref 22–32)
Calcium: 7.8 mg/dL — ABNORMAL LOW (ref 8.9–10.3)
Creatinine, Ser: 0.6 mg/dL (ref 0.44–1.00)
GFR calc non Af Amer: >60 mL/min (ref >60)
GLUCOSE: 75 mg/dL (ref 65–99)
POTASSIUM: 3.2 mmol/L — AB (ref 3.5–5.1)
Sodium: 140 mmol/L (ref 135–145)

## 2016-05-31 LAB — CBC
HEMATOCRIT: 24.7 % — AB (ref 36.0–46.0)
HEMOGLOBIN: 8 g/dL — AB (ref 12.0–15.0)
MCH: 26.4 pg (ref 26.0–34.0)
MCHC: 32.4 g/dL (ref 30.0–36.0)
MCV: 81.5 fL (ref 78.0–100.0)
Platelets: 385 10*3/uL (ref 150–400)
RBC: 3.03 MIL/uL — ABNORMAL LOW (ref 3.87–5.11)
RDW: 17.3 % — ABNORMAL HIGH (ref 11.5–15.5)
WBC: 35 10*3/uL — AB (ref 4.0–10.5)

## 2016-05-31 LAB — LACTIC ACID, PLASMA: Lactic Acid, Venous: 1.6 mmol/L (ref 0.5–1.9)

## 2016-05-31 LAB — EXPECTORATED SPUTUM ASSESSMENT W REFEX TO RESP CULTURE

## 2016-05-31 LAB — EXPECTORATED SPUTUM ASSESSMENT W GRAM STAIN, RFLX TO RESP C

## 2016-05-31 MED ORDER — FAMOTIDINE 20 MG PO TABS
20.0000 mg | ORAL_TABLET | Freq: Two times a day (BID) | ORAL | Status: DC
  Administered 2016-05-31 – 2016-06-06 (×13): 20 mg via ORAL
  Filled 2016-05-31 (×13): qty 1

## 2016-05-31 MED ORDER — PREDNISONE 5 MG PO TABS
5.0000 mg | ORAL_TABLET | Freq: Every day | ORAL | Status: DC
  Administered 2016-05-31 – 2016-06-02 (×3): 5 mg via ORAL
  Filled 2016-05-31 (×3): qty 1

## 2016-05-31 MED ORDER — DEXTROSE 5 % IV SOLN
2.0000 g | INTRAVENOUS | Status: DC
  Administered 2016-05-31 – 2016-06-03 (×4): 2 g via INTRAVENOUS
  Filled 2016-05-31 (×4): qty 2

## 2016-05-31 MED ORDER — VANCOMYCIN HCL IN DEXTROSE 1-5 GM/200ML-% IV SOLN
1000.0000 mg | INTRAVENOUS | Status: DC
  Administered 2016-05-31 – 2016-06-03 (×4): 1000 mg via INTRAVENOUS
  Filled 2016-05-31 (×4): qty 200

## 2016-05-31 MED ORDER — POTASSIUM CHLORIDE 20 MEQ/15ML (10%) PO SOLN: 20.0000 meq | Freq: Every day | ORAL | Status: DC

## 2016-05-31 MED ORDER — POTASSIUM CHLORIDE 10 MEQ/50ML IV SOLN
10.0000 meq | INTRAVENOUS | Status: AC
Start: 2016-05-31 — End: 2016-05-31
  Administered 2016-05-31 (×4): 10 meq via INTRAVENOUS
  Filled 2016-05-31 (×4): qty 50

## 2016-05-31 NOTE — Progress Notes (Signed)
Resumed care of patient. Agree with previous assessment. Will continue to monitor patient.

## 2016-05-31 NOTE — Progress Notes (Signed)
Progress Note    Destiny Harrison  ZOX:[id number] DOB: 1941/04/24  DOA: 05/28/2016 PCP: Thressa Sheller, MD    Brief Narrative:   Destiny Harrison is an 75 y.o. female with a past medical history metastatic non-small cell lung cancer diagnosed in March 2013 status post palliative radiation therapy to left lung mass, systemic therapy with carboplatin and Abraxane, also receiving immunotherapy with Nivolumab in 2016, currently receiving chemotherapy with Docetaxel and Cyramza every 3 weeks with Neulasta support, under the care of Dr. Julien Nordmann, Who was admitted 05/28/16 with a chief complaint of nausea/vomiting. Her last dose of chemotherapy was 05/23/16. She was evaluated in the ED on 05/27/16 where she received IV fluids and anti-medics and was discharged home.  Assessment/Plan:   Principal Problem:   Chemotherapy-induced enteritis/Nausea and vomiting/dehydration Continue supportive management with IV fluids and anti-emetics. No evidence of obstruction. Continue IV Pepcid twice a day. Diet advanced to full liquids. Advance further as tolerated.  Active Problems:   SIRS rule out sepsis in an immunocompromised host The patient has a marked increase in her WBC count, tachycardia and tachypnea but is afebrile. She is not hypotensive. Lactic acid 1.6. Chest x-ray negative for obvious pneumonia. Urine cultures done on admission negative. The patient has been placed on empiric vancomycin and cefepime given her immunocompromised status.    Severe protein calorie malnutrition Evaluated by dietitian 05/29/16. Supplements ordered.    Anti-neoplastic chemotherapy-induced anemia Monitor hemoglobin. Currently 8 mg/dL. No absolute indication for transfusion, but could consider if she becomes symptomatic or hemoglobin drops less than 7.    Hypertension  Continue Cardizem.    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 Continue Pulmicort and bronchodilators as needed. Continue IV Solu-Medrol 10 mg  daily. Continue supplemental oxygen.    Primary cancer of right upper lobe of lung (HCC)/non-small cell lung cancer She currently receives her care at the [facility], follows Dr. Julien Nordmann. Currently receiving chemotherapy with Docetaxel and Cyramza every 3 weeks with Neulasta support, last infusion on 05/23/2016. Her oncologist was notified of her admission.    ? UTI Placed on IV ceftriaxone on admission. No complaints of dysuria or UTI symptoms, so empiric antibiotics were discontinued 05/29/16. Urine cultures grew multiple bacterial morphotypes, consistent with contamination. Due to marked elevation of her WBC and the development of SIRS, empiric antibiotics resumed with vancomycin/cefepime 05/31/16.    Hypokalemia Secondary to GI losses. Will give 4 runs of IV potassium today.   Family Communication/Anticipated D/C date and plan/Code Status   DVT prophylaxis: Lovenox ordered. Code Status: DO NOT RESUSCITATE Family Communication: No family currently at the bedside. Disposition Plan: Home when nausea/vomiting resolved.   Medical Consultants:    Oncology   Procedures:    None.  Anti-Infectives:   Rocephin 05/28/16---> 05/29/16 Vancomycin 05/31/16---> Cefepime 05/31/16--->  Subjective:   [name] has a cough with blood tinged sputum, no nausea or vomiting, tolerating solid foods.  No obvious shortness of breath.  Objective:    Vitals:   05/30/16 1545 05/30/16 1940 05/30/16 2115 05/31/16 0609  BP: (!) 154/72  (!) 147/67 134/62  Pulse: (!) 118  (!) 130 (!) 104  Resp: (!) 22  (!) 22 (!) 22  Temp: 98.5 F (36.9 C)  98.3 F (36.8 C) 98.7 F (37.1 C)  TempSrc: Oral  Oral Oral  SpO2: 98% 97% 99% 98%  Weight:      Height:        Intake/Output Summary (Last 24 hours) at 05/31/16 8119 Last data  filed at 05/31/16 0708  Gross per 24 hour  Intake             2425 ml  Output             1275 ml  Net             1150 ml   Filed Weights   05/28/16 1500 05/28/16 1746    Weight: 52.2 kg (115 lb) 48.2 kg (106 lb 4.8 oz)    Exam: General exam: Appears calm, Nontoxic appearing. Respiratory system: Scattered rhonchi and wheezes. Respiratory effort increased. Cardiovascular system: S1 & S2 heard, RRR. No JVD,  rubs, gallops or clicks. No murmurs. Gastrointestinal system: Abdomen is nondistended, soft and nontender. No organomegaly or masses felt. Normal bowel sounds heard. Central nervous system: Alert and oriented. No focal neurological deficits. Extremities: No clubbing,  or cyanosis. No edema. Skin: No rashes, lesions or ulcers. Psychiatry: Judgement and insight appear normal. Mood & affect appropriate.   Data Reviewed:   I have personally reviewed following labs and imaging studies:  Labs: Basic Metabolic Panel:  Recent Labs Lab 05/27/16 1544 05/28/16 1451 05/29/16 0425 05/30/16 0410 05/31/16 0410  NA 139 138 136 137 140  K 2.9* 3.0* 4.5 3.7 3.2*  CL 102 100* 103 104 104  CO2 '28 29 27 27 28  '$ GLUCOSE 86 93 72 91 75  BUN '15 10 6 '$ 5* <5*  CREATININE 0.55 0.64 0.53 0.58 0.60  CALCIUM 8.1* 8.7* 7.6* 7.4* 7.8*   GFR Estimated Creatinine Clearance: 45.5 mL/min (by C-G formula based on SCr of 0.8 mg/dL). Liver Function Tests:  Recent Labs Lab 05/27/16 1544 05/28/16 1451  AST 14* 14*  ALT 12* 11*  ALKPHOS 69 89  BILITOT 0.8 1.1  PROT 5.8* 6.8  ALBUMIN 2.9* 3.4*    Recent Labs Lab 05/27/16 1544 05/28/16 1451  LIPASE 26 18   CBC:  Recent Labs Lab 05/27/16 1544 05/28/16 1451 05/29/16 0425 05/30/16 0410 05/31/16 0410  WBC 28.7* 12.1* 4.7 11.4* 35.0*  NEUTROABS 27.2*  --   --   --   --   HGB 8.6* 9.9* 8.1* 7.9* 8.0*  HCT 26.2* 29.9* 24.6* 24.4* 24.7*  MCV 81.6 81.5 80.9 80.3 81.5  PLT 505* 477* 394 371 385    Sepsis Labs:  Recent Labs Lab 05/28/16 1451 05/28/16 1504 05/29/16 0425 05/30/16 0410 05/31/16 0410  WBC 12.1*  --  4.7 11.4* 35.0*  LATICACIDVEN  --  1.82  --   --   --    Urine analysis:    Component  Value Date/Time   COLORURINE YELLOW 05/27/2016 [place] 05/27/2016 1659   LABSPEC 1.011 05/27/2016 1659   PHURINE 7.0 05/27/2016 1659   GLUCOSEU NEGATIVE 05/27/2016 1659   HGBUR NEGATIVE 05/27/2016 1659   BILIRUBINUR NEGATIVE 05/27/2016 1659   KETONESUR NEGATIVE 05/27/2016 1659   PROTEINUR NEGATIVE 05/27/2016 1659   UROBILINOGEN 0.2 08/18/2015 0730   NITRITE NEGATIVE 05/27/2016 1659   LEUKOCYTESUR SMALL (A) 05/27/2016 1659   Microbiology Recent Results (from the past 240 hour(s))  Urine culture     Status: Abnormal   Collection Time: 05/27/16  5:00 PM  Result Value Ref Range Status   Specimen Description URINE, RANDOM  Final   Special Requests NONE  Final   Culture MULTIPLE SPECIES PRESENT, SUGGEST RECOLLECTION (A)  Final   Report Status 05/29/2016 FINAL  Final    Radiology: Dg Chest 2 View  Result Date: 05/28/2016 CLINICAL DATA:  SOB  increasing x 2 days - currently being treated for lung cancer - hx COPD - hx asthma EXAM: CHEST  2 VIEW COMPARISON:  05/07/2016 FINDINGS: The cardiac silhouette is normal in size. No mediastinal masses or convincing adenopathy. Coarse reticular type opacity extends from superior left hilum. There is fullness from the inferior right hilum. These findings stable. Lungs are hyperexpanded but otherwise clear. No pleural effusion or pneumothorax. Right anterior chest wall Port-A-Cath is stable. Bony thorax is demineralized. Mild compression fracture of a mid to upper thoracic vertebra, also stable. No convincing osteoblastic or osteolytic lesions. IMPRESSION: No acute cardiopulmonary disease. No significant change from the previous chest radiograph. Electronically Signed   By: Lajean Manes M.D.   On: 05/28/2016 14:49    Medications:   . budesonide  0.25 mg Nebulization BID  . diltiazem  180 mg Oral QPM  . enoxaparin (LOVENOX) injection  40 mg Subcutaneous Q24H  . famotidine (PEPCID) IV  20 mg Intravenous Q12H  . feeding supplement  1  Container Oral BID BM  . guaiFENesin  600 mg Oral BID  . levalbuterol  0.63 mg Nebulization TID  . magnesium oxide  400 mg Oral BID  . methylPREDNISolone (SOLU-MEDROL) injection  10 mg Intravenous Daily  . morphine  15 mg Oral Q12H  . sodium chloride flush  3 mL Intravenous Q12H   Continuous Infusions: . sodium chloride 50 mL/hr at 05/31/16 0541    Time spent: 35 minutes.  The patient is medically complex with multiple co-morbidities and is at high risk for clinical deterioration and requires high complexity decision making.    LOS: 3 days   [facility] Pager [phone]. If unable to reach me by pager, please call my cell phone at [phone].  *Please refer to [email or web address], password [email or web address] to get updated schedule on who will round on this patient, as hospitalists switch teams weekly. If 7PM-7AM, please contact night-coverage at [email or web address], password [email or web address] for any overnight needs.  05/31/2016, 7:26 AM

## 2016-05-31 NOTE — Progress Notes (Signed)
Pharmacy Antibiotic Note  Destiny Harrison is a 75 y.o. female with lung cancer currently on undergoing chemotherapy treatment, admitted on 8/8 for chemotherapy-induced n/v and dehydration.  Ceftriaxone was started on admission for r/o UTI and d/ced after 24 hours. On 8/11, wbc increased sharply to 35.  To start broad abx with vancomycin and cefepime for suspected HCAP.  Plan: - vancomycin 1gm IV q24h - cefepime 2gm IV q24h  ________________________  Height: '5\' 5"'$  (165.1 cm) Weight: 106 lb 4.8 oz (48.2 kg) IBW/kg (Calculated) : 57  Temp (24hrs), Avg:98.5 F (36.9 C), Min:98.3 F (36.8 C), Max:98.7 F (37.1 C)   Recent Labs Lab 05/27/16 1544 05/28/16 1451 05/28/16 1504 05/29/16 0425 05/30/16 0410 05/31/16 0410 05/31/16 0910  WBC 28.7* 12.1*  --  4.7 11.4* 35.0*  --   CREATININE 0.55 0.64  --  0.53 0.58 0.60  --   LATICACIDVEN  --   --  1.82  --   --   --  1.6    Estimated Creatinine Clearance: 45.5 mL/min (by C-G formula based on SCr of 0.8 mg/dL).    Allergies  Allergen Reactions  . Shellfish Allergy Swelling    Antimicrobials this admission: 8/8 CTX (UTI)>>8/9 8/11 vanc>> 8/11 cefepime>>  Levels/dose changes this admission: n/a  Microbiology results: 8/7 ucx: multiple species present 8/7 UA: small leukocytes  Thank you for allowing pharmacy to be a part of this patient's care.  Lynelle Doctor 05/31/2016 10:50 AM

## 2016-05-31 NOTE — Progress Notes (Signed)
PT Cancellation Note  Patient Details Name: Dusty Wagoner MRN: [id number] DOB: 30-May-1941   Cancelled Treatment:     attempted to see twice today.  Both times pt noted with WOC and reports having "not such a good day" today.  Pt reported dyspnea after getting back to bed from using a bathroom which required extended rest break.  Will check back another day as schedule permits.     Nathanial Rancher 05/31/2016, 2:42 PM

## 2016-06-01 ENCOUNTER — Encounter (HOSPITAL_COMMUNITY): Payer: Self-pay | Admitting: Radiology

## 2016-06-01 ENCOUNTER — Inpatient Hospital Stay (HOSPITAL_COMMUNITY): Payer: Medicare Other

## 2016-06-01 DIAGNOSIS — A419 Sepsis, unspecified organism: Secondary | ICD-10-CM

## 2016-06-01 DIAGNOSIS — M7989 Other specified soft tissue disorders: Secondary | ICD-10-CM

## 2016-06-01 LAB — BASIC METABOLIC PANEL
ANION GAP: 6 (ref 5–15)
BUN: <5 mg/dL — ABNORMAL LOW (ref 6–20)
CHLORIDE: 99 mmol/L — AB (ref 101–111)
CO2: 32 mmol/L (ref 22–32)
Calcium: 8.2 mg/dL — ABNORMAL LOW (ref 8.9–10.3)
Creatinine, Ser: 0.5 mg/dL (ref 0.44–1.00)
GFR calc non Af Amer: >60 mL/min (ref >60)
GLUCOSE: 94 mg/dL (ref 65–99)
Potassium: 3.3 mmol/L — ABNORMAL LOW (ref 3.5–5.1)
Sodium: 137 mmol/L (ref 135–145)

## 2016-06-01 LAB — CBC
HEMATOCRIT: 25.7 % — AB (ref 36.0–46.0)
HEMOGLOBIN: 8.4 g/dL — AB (ref 12.0–15.0)
MCH: 26.3 pg (ref 26.0–34.0)
MCHC: 32.7 g/dL (ref 30.0–36.0)
MCV: 80.6 fL (ref 78.0–100.0)
Platelets: 394 10*3/uL (ref 150–400)
RBC: 3.19 MIL/uL — AB (ref 3.87–5.11)
RDW: 17.5 % — ABNORMAL HIGH (ref 11.5–15.5)
WBC: 46.9 10*3/uL — ABNORMAL HIGH (ref 4.0–10.5)

## 2016-06-01 MED ORDER — IOPAMIDOL (ISOVUE-370) INJECTION 76%
100.0000 mL | Freq: Once | INTRAVENOUS | Status: AC | PRN
  Administered 2016-06-01: 100 mL via INTRAVENOUS

## 2016-06-01 MED ORDER — MAGNESIUM CITRATE PO SOLN
1.0000 | Freq: Once | ORAL | Status: AC
  Administered 2016-06-01: 1 via ORAL

## 2016-06-01 MED ORDER — POTASSIUM CHLORIDE 20 MEQ/15ML (10%) PO SOLN
20.0000 meq | Freq: Three times a day (TID) | ORAL | Status: DC
  Administered 2016-06-01 – 2016-06-02 (×6): 20 meq via ORAL
  Filled 2016-06-01 (×6): qty 15

## 2016-06-01 MED ORDER — POLYETHYLENE GLYCOL 3350 17 G PO PACK
17.0000 g | PACK | Freq: Every day | ORAL | Status: DC
  Administered 2016-06-01 – 2016-06-06 (×6): 17 g via ORAL
  Filled 2016-06-01 (×6): qty 1

## 2016-06-01 NOTE — Progress Notes (Signed)
Progress Note    Cheryn Lundquist  ATF:[id number] DOB: 04-19-1941  DOA: 05/28/2016 PCP: Thressa Sheller, MD    Brief Narrative:   Destiny Harrison is an 75 y.o. female with a past medical history metastatic non-small cell lung cancer diagnosed in March 2013 status post palliative radiation therapy to left lung mass, systemic therapy with carboplatin and Abraxane, also receiving immunotherapy with Nivolumab in 2016, currently receiving chemotherapy with Docetaxel and Cyramza every 3 weeks with Neulasta support, under the care of Dr. Julien Nordmann, Who was admitted 05/28/16 with a chief complaint of nausea/vomiting. Her last dose of chemotherapy was 05/23/16. She was evaluated in the ED on 05/27/16 where she received IV fluids and anti-medics and was discharged home.  Assessment/Plan:   Principal Problems:   Chemotherapy-induced enteritis/Nausea and vomiting/dehydration Continue supportive management with IV fluids and anti-emetics. No evidence of obstruction. Continue IV Pepcid twice a day. Tolerating a soft diet.   Sepsis secondary to HCAP in an immunocompromised host The patient has a marked increase in her WBC count, tachycardia and tachypnea but is afebrile. She is not hypotensive. Lactic acid 1.6. Chest x-ray negative for obvious pneumonia. Urine cultures done on admission negative. The patient has been placed on empiric vancomycin and cefepime given her immunocompromised status. WBC 46.9 today. Remains tachycardic, but still afebrile. Follow-up repeat blood cultures and sputum culture. Given tachycardia, hemoptysis and known malignancy, also need to rule out pulmonary embolism. CT angiogram done 06/01/16, negative for pulmonary embolism but did show interstitial infiltrates consistent with pneumonia. Continue broad-spectrum antibiotics.  Active Problems:   Right upper extremity swelling Upper extremity venous duplex negative.    Severe protein calorie malnutrition Evaluated by dietitian 05/29/16. Supplements  ordered.    Anti-neoplastic chemotherapy-induced anemia Monitor hemoglobin. Currently 8.4 mg/dL. No indication for transfusion, but could consider if she becomes symptomatic or hemoglobin drops less than 7.    Hypertension  Continue Cardizem.    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 Continue Pulmicort and bronchodilators as needed. Continue prednisone. Continue supplemental oxygen.    Primary cancer of right upper lobe of lung (HCC)/non-small cell lung cancer She currently receives her care at the [facility], follows Dr. Julien Nordmann. Currently receiving chemotherapy with Docetaxel and Cyramza every 3 weeks with Neulasta support, last infusion on 05/23/2016. Her oncologist was notified of her admission.    Hypokalemia Continue to replete.   Family Communication/Anticipated D/C date and plan/Code Status   DVT prophylaxis: Lovenox ordered. Code Status: DO NOT RESUSCITATE Family Communication: No family currently at the bedside. Disposition Plan: Home when nausea/vomiting resolved.   Medical Consultants:    Oncology   Procedures:    None.  Anti-Infectives:   Rocephin 05/28/16---> 05/29/16 Vancomycin 05/31/16---> Cefepime 05/31/16--->  Subjective:   Norely Schlick reports mild dyspnea. Appetite fair. No nausea or vomiting. Coughing up purulent sputum, occasionally blood-tinged. Has not moved bowels in the last 3-4 days.  Objective:    Vitals:   05/31/16 1542 05/31/16 1827 05/31/16 2046 06/01/16 0407  BP:   (!) 118/59 (!) 127/58  Pulse: (!) 131 (!) 137 (!) 116 (!) 110  Resp:  (!) '24 20 20  '$ Temp:   99.1 F (37.3 C) 98.1 F (36.7 C)  TempSrc:   Oral Oral  SpO2:  99% 96% 96%  Weight:      Height:        Intake/Output Summary (Last 24 hours) at 06/01/16 0816 Last data filed at 06/01/16 2706  Gross per 24 hour  Intake  2295.01 ml  Output             1100 ml  Net          1195.01 ml   Filed Weights   05/28/16 1500 05/28/16 1746    Weight: 52.2 kg (115 lb) 48.2 kg (106 lb 4.8 oz)    Exam: General exam: Appears calm, Nontoxic appearing. Respiratory system: Scattered rhonchi and wheezes. Respiratory effort increased. Cardiovascular system: S1 & S2 heard, tachycardic. No JVD,  rubs, gallops or clicks. No murmurs. Gastrointestinal system: Abdomen is nondistended, soft and nontender. No organomegaly or masses felt. Normal bowel sounds heard. Central nervous system: Alert and oriented. No focal neurological deficits. Extremities: No clubbing,  or cyanosis. No edema. Skin: No rashes, lesions or ulcers. Psychiatry: Judgement and insight appear normal. Mood & affect appropriate.   Data Reviewed:   I have personally reviewed following labs and imaging studies:  Labs: Basic Metabolic Panel:  Recent Labs Lab 05/28/16 1451 05/29/16 0425 05/30/16 0410 05/31/16 0410 06/01/16 0400  NA 138 136 137 140 137  K 3.0* 4.5 3.7 3.2* 3.3*  CL 100* 103 104 104 99*  CO2 '29 27 27 28 '$ 32  GLUCOSE 93 72 91 75 94  BUN 10 6 5* <5* <5*  CREATININE 0.64 0.53 0.58 0.60 0.50  CALCIUM 8.7* 7.6* 7.4* 7.8* 8.2*   GFR Estimated Creatinine Clearance: 45.5 mL/min (by C-G formula based on SCr of 0.8 mg/dL). Liver Function Tests:  Recent Labs Lab 05/27/16 1544 05/28/16 1451  AST 14* 14*  ALT 12* 11*  ALKPHOS 69 89  BILITOT 0.8 1.1  PROT 5.8* 6.8  ALBUMIN 2.9* 3.4*    Recent Labs Lab 05/27/16 1544 05/28/16 1451  LIPASE 26 18   CBC:  Recent Labs Lab 05/27/16 1544 05/28/16 1451 05/29/16 0425 05/30/16 0410 05/31/16 0410 06/01/16 0400  WBC 28.7* 12.1* 4.7 11.4* 35.0* 46.9*  NEUTROABS 27.2*  --   --   --   --   --   HGB 8.6* 9.9* 8.1* 7.9* 8.0* 8.4*  HCT 26.2* 29.9* 24.6* 24.4* 24.7* 25.7*  MCV 81.6 81.5 80.9 80.3 81.5 80.6  PLT 505* 477* 394 371 385 394    Sepsis Labs:  Recent Labs Lab 05/28/16 1504 05/29/16 0425 05/30/16 0410 05/31/16 0410 05/31/16 0910 06/01/16 0400  WBC  --  4.7 11.4* 35.0*  --  46.9*   LATICACIDVEN 1.82  --   --   --  1.6  --    Urine analysis:    Component Value Date/Time   COLORURINE YELLOW 05/27/2016 [place] 05/27/2016 1659   LABSPEC 1.011 05/27/2016 1659   PHURINE 7.0 05/27/2016 1659   GLUCOSEU NEGATIVE 05/27/2016 1659   HGBUR NEGATIVE 05/27/2016 1659   BILIRUBINUR NEGATIVE 05/27/2016 1659   KETONESUR NEGATIVE 05/27/2016 1659   PROTEINUR NEGATIVE 05/27/2016 1659   UROBILINOGEN 0.2 08/18/2015 0730   NITRITE NEGATIVE 05/27/2016 1659   LEUKOCYTESUR SMALL (A) 05/27/2016 1659   Microbiology Recent Results (from the past 240 hour(s))  Urine culture     Status: Abnormal   Collection Time: 05/27/16  5:00 PM  Result Value Ref Range Status   Specimen Description URINE, RANDOM  Final   Special Requests NONE  Final   Culture MULTIPLE SPECIES PRESENT, SUGGEST RECOLLECTION (A)  Final   Report Status 05/29/2016 FINAL  Final  Culture, blood (Routine X 2) w Reflex to ID Panel     Status: None (Preliminary result)   Collection Time: 05/31/16  9:50  AM  Result Value Ref Range Status   Specimen Description BLOOD LEFT ARM  Final   Special Requests IN PEDIATRIC BOTTLE 3 CC  Final   Culture   Final    NO GROWTH <12 HOURS Performed at [facility]    Report Status PENDING  Incomplete  Culture, blood (Routine X 2) w Reflex to ID Panel     Status: None (Preliminary result)   Collection Time: 05/31/16 10:08 AM  Result Value Ref Range Status   Specimen Description BLOOD RIGHT ARM  Final   Special Requests BOTTLES DRAWN AEROBIC AND ANAEROBIC 6 CC EA  Final   Culture   Final    NO GROWTH <12 HOURS Performed at [facility]    Report Status PENDING  Incomplete  Culture, expectorated sputum-assessment     Status: None   Collection Time: 05/31/16 11:51 AM  Result Value Ref Range Status   Specimen Description SPUTUM  Final   Special Requests Immunocompromised  Final   Sputum evaluation   Final    THIS SPECIMEN IS ACCEPTABLE. RESPIRATORY  CULTURE REPORT TO FOLLOW.   Report Status 05/31/2016 FINAL  Final  Culture, respiratory (NON-Expectorated)     Status: None (Preliminary result)   Collection Time: 05/31/16 11:51 AM  Result Value Ref Range Status   Specimen Description SPUTUM  Final   Special Requests NONE  Final   Gram Stain   Final    RARE WBC PRESENT, PREDOMINANTLY PMN MODERATE GRAM POSITIVE COCCI IN PAIRS FEW GRAM NEGATIVE RODS Performed at [facility]    Culture PENDING  Incomplete   Report Status PENDING  Incomplete    Radiology: Dg Chest 2 View  Result Date: 05/28/2016 CLINICAL DATA:  SOB increasing x 2 days - currently being treated for lung cancer - hx COPD - hx asthma EXAM: CHEST  2 VIEW COMPARISON:  05/07/2016 FINDINGS: The cardiac silhouette is normal in size. No mediastinal masses or convincing adenopathy. Coarse reticular type opacity extends from superior left hilum. There is fullness from the inferior right hilum. These findings stable. Lungs are hyperexpanded but otherwise clear. No pleural effusion or pneumothorax. Right anterior chest wall Port-A-Cath is stable. Bony thorax is demineralized. Mild compression fracture of a mid to upper thoracic vertebra, also stable. No convincing osteoblastic or osteolytic lesions. IMPRESSION: No acute cardiopulmonary disease. No significant change from the previous chest radiograph. Electronically Signed   By: Lajean Manes M.D.   On: 05/28/2016 14:49   Ct Angio Chest Pe W Or Wo Contrast  Result Date: 06/01/2016 CLINICAL DATA:  Hemoptysis EXAM: CT ANGIOGRAPHY CHEST WITH CONTRAST TECHNIQUE: Multidetector CT imaging of the chest was performed using the standard protocol during bolus administration of intravenous contrast. Multiplanar CT image reconstructions and MIPs were obtained to evaluate the vascular anatomy. CONTRAST:  100 cc Isovue 370 COMPARISON:  02/12/2016 FINDINGS: There are no filling defects in the pulmonary arterial tree to suggest acute pulmonary  thromboembolism. Right peritracheal mass in the upper mediastinum measures 3.2 x 3.1 cm and previously measured 6.4 x 4.5 cm. Is improved. Ill-defined calcifications persist within the mass. Post radiation changes in the medial left lung are stable. Soft tissue prominence in the medial right middle lobe have improved. A focal ground-glass opacity in the lateral basal right lower lobe on image 63 measures 10 mm. Small pleural effusions have developed. Bilateral dependent atelectasis. Partial solid nodule in the left lower lobe measures 5 mm on image 49. There is fluid or mucus material in  left lower lobe airways extending to the posterior basal segment. See image 49. Severe emphysema persists. Interlobular septa are more prominent suggesting interstitial edema or pneumonia. Biapical scarring is stable. No pneumothorax. Right jugular Port-A-Cath is stable. Right ventricle is dilated. Diffuse hepatic steatosis. Calcified granuloma in the right lobe of the liver. Tiny hypodensities in the lateral spleen are nonspecific. Mid-level thoracic compression deformities are stable. This includes T4, T5, and probably T6. Review of the MIP images confirms the above findings. IMPRESSION: No evidence of acute pulmonary thromboembolism New small bilateral pleural effusions with dependent atelectasis New sub cm part solid left lower lobe nodule. New 10 mm ground-glass opacity in the right lower lobe. Central right middle lobe mass and large right paratracheal mass are improved. New interstitial changes suggesting interstitial edema or pneumonia. Electronically Signed   By: Marybelle Killings M.D.   On: 06/01/2016 09:49   Dg Chest Port 1 View  Result Date: 05/31/2016 CLINICAL DATA:  Lung cancer, dyspnea EXAM: PORTABLE CHEST 1 VIEW COMPARISON:  05/28/2016 FINDINGS: Cardiomediastinal silhouette is stable. Hyperinflation again noted. Again noted right paratracheal adenopathy. Stable left perihilar postradiation changes. No definite  superimposed infiltrate or pulmonary edema. Mild perihilar bronchitic changes. Right IJ Port-A-Cath is unchanged in position. IMPRESSION: Hyperinflation again noted. Again noted right paratracheal adenopathy. Stable left perihilar postradiation changes. No definite superimposed infiltrate or pulmonary edema. Mild perihilar bronchitic changes. Electronically Signed   By: Lahoma Crocker M.D.   On: 05/31/2016 10:12    Medications:   . budesonide  0.25 mg Nebulization BID  . ceFEPime (MAXIPIME) IV  2 g Intravenous Q24H  . diltiazem  180 mg Oral QPM  . enoxaparin (LOVENOX) injection  40 mg Subcutaneous Q24H  . famotidine  20 mg Oral BID  . feeding supplement  1 Container Oral BID BM  . guaiFENesin  600 mg Oral BID  . levalbuterol  0.63 mg Nebulization TID  . magnesium oxide  400 mg Oral BID  . morphine  15 mg Oral Q12H  . potassium chloride  20 mEq Oral Daily  . predniSONE  5 mg Oral Q breakfast  . sodium chloride flush  3 mL Intravenous Q12H  . vancomycin  1,000 mg Intravenous Q24H   Continuous Infusions: . sodium chloride 50 mL/hr at 05/31/16 0541    Time spent: 35 minutes.  The patient is medically complex with multiple co-morbidities and is at high risk for clinical deterioration and requires high complexity decision making.    LOS: 4 days   [place] Pager [phone]. If unable to reach me by pager, please call my cell phone at [phone].  *Please refer to [email or web address], password [email or web address] to get updated schedule on who will round on this patient, as hospitalists switch teams weekly. If 7PM-7AM, please contact night-coverage at [email or web address], password [email or web address] for any overnight needs.  06/01/2016, 8:16 AM

## 2016-06-01 NOTE — Progress Notes (Signed)
Resumed care of patient. Agree with previous assessment. Orders reviewed. Will continue to monitor patient.

## 2016-06-01 NOTE — Progress Notes (Signed)
Patient with new onset of swelling in the RUE. Patient c/o tingling in BUE. 2+ BUE pulses. NP on call notified. New order placed. Will continue to monitor closely.

## 2016-06-01 NOTE — Progress Notes (Signed)
*  Preliminary Results* Right upper extremity venous duplex completed. Right upper extremity is negative for deep and superficial vein thrombosis.  06/01/2016 9:04 AM  Maudry Mayhew, BS, RVT, RDCS, RDMS

## 2016-06-02 LAB — CBC
HCT: 24.6 % — ABNORMAL LOW (ref 36.0–46.0)
Hemoglobin: 8 g/dL — ABNORMAL LOW (ref 12.0–15.0)
MCH: 26.1 pg (ref 26.0–34.0)
MCHC: 32.5 g/dL (ref 30.0–36.0)
MCV: 80.4 fL (ref 78.0–100.0)
Platelets: 315 10*3/uL (ref 150–400)
RBC: 3.06 MIL/uL — ABNORMAL LOW (ref 3.87–5.11)
RDW: 17.6 % — AB (ref 11.5–15.5)
WBC: 40.9 10*3/uL — ABNORMAL HIGH (ref 4.0–10.5)

## 2016-06-02 LAB — BASIC METABOLIC PANEL
Anion gap: 7 (ref 5–15)
BUN: 5 mg/dL — AB (ref 6–20)
CALCIUM: 8.3 mg/dL — AB (ref 8.9–10.3)
CHLORIDE: 97 mmol/L — AB (ref 101–111)
CO2: 32 mmol/L (ref 22–32)
CREATININE: 0.58 mg/dL (ref 0.44–1.00)
GFR calc non Af Amer: >60 mL/min (ref >60)
Glucose, Bld: 95 mg/dL (ref 65–99)
Potassium: 3.4 mmol/L — ABNORMAL LOW (ref 3.5–5.1)
SODIUM: 136 mmol/L (ref 135–145)

## 2016-06-02 LAB — CULTURE, RESPIRATORY W GRAM STAIN

## 2016-06-02 LAB — CULTURE, RESPIRATORY

## 2016-06-02 MED ORDER — PREDNISONE 50 MG PO TABS: 50.0000 mg | ORAL_TABLET | Freq: Every day | ORAL | Status: DC

## 2016-06-02 MED ORDER — POTASSIUM CHLORIDE 10 MEQ/50ML IV SOLN
10.0000 meq | INTRAVENOUS | Status: AC
  Administered 2016-06-02 (×4): 10 meq via INTRAVENOUS
  Filled 2016-06-02 (×4): qty 50

## 2016-06-02 MED ORDER — ENSURE ENLIVE PO LIQD: 237.0000 mL | Freq: Two times a day (BID) | ORAL | Status: DC

## 2016-06-02 MED ORDER — GUAIFENESIN ER 600 MG PO TB12
1200.0000 mg | ORAL_TABLET | Freq: Two times a day (BID) | ORAL | Status: DC
  Administered 2016-06-02 – 2016-06-06 (×8): 1200 mg via ORAL
  Filled 2016-06-02 (×8): qty 2

## 2016-06-02 MED ORDER — METHYLPREDNISOLONE SODIUM SUCC 125 MG IJ SOLR
60.0000 mg | Freq: Four times a day (QID) | INTRAMUSCULAR | Status: DC
  Administered 2016-06-02 – 2016-06-05 (×12): 60 mg via INTRAVENOUS
  Filled 2016-06-02 (×12): qty 2

## 2016-06-02 MED ORDER — POTASSIUM CHLORIDE 10 MEQ/100ML IV SOLN: 10.0000 meq | INTRAVENOUS | Status: DC

## 2016-06-02 NOTE — Progress Notes (Signed)
Progress Note    Destiny Harrison  YKD:[id number] DOB: Jul 07, 1941  DOA: 05/28/2016 PCP: Thressa Sheller, MD    Brief Narrative:   Destiny Harrison is an 75 y.o. female with a past medical history metastatic non-small cell lung cancer diagnosed in March 2013 status post palliative radiation therapy to left lung mass, systemic therapy with carboplatin and Abraxane, also receiving immunotherapy with Nivolumab in 2016, currently receiving chemotherapy with Docetaxel and Cyramza every 3 weeks with Neulasta support, under the care of Dr. Julien Nordmann, Who was admitted 05/28/16 with a chief complaint of nausea/vomiting. Her last dose of chemotherapy was 05/23/16. She was evaluated in the ED on 05/27/16 where she received IV fluids and anti-medics and was discharged home.  Assessment/Plan:   Principal Problems:   Chemotherapy-induced enteritis/Nausea and vomiting/dehydration Continue supportive management with IV fluids and anti-emetics. No evidence of obstruction. Continue IV Pepcid twice a day. Tolerating a soft diet.   Sepsis secondary to HCAP in an immunocompromised host The patient had a marked increase in her WBC count on 05/31/16, which was associated with tachycardia and tachypnea. She was not hypotensive or febrile. Lactic acid was 1.6. Chest x-ray was negative for obvious pneumonia. Urine cultures done on admission were negative. The patient was subsequently placed on empiric vancomycin and cefepime given her immunocompromised status. Repeat blood cultures and sputum culture were done. CT angiogram done 06/01/16, negative for pulmonary embolism but did show interstitial infiltrates consistent with pneumonia. Continue broad-spectrum antibiotics.  Active Problems:   Right upper extremity swelling Upper extremity venous duplex negative.    Severe protein calorie malnutrition Evaluated by dietitian 05/29/16. Supplements ordered.    Anti-neoplastic chemotherapy-induced anemia Monitor hemoglobin. Currently 8.0  mg/dL. No indication for transfusion, but could consider if she becomes symptomatic or hemoglobin drops less than 7.    Hypertension  Continue Cardizem.    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 Continue Pulmicort and bronchodilators as needed. Continue supplemental oxygen. Discontinue prednisone and start Solu-Medrol 60 mg IV every 12 hours.    Primary cancer of right upper lobe of lung (HCC)/non-small cell lung cancer She currently receives her care at the [facility], follows Dr. Julien Nordmann. Currently receiving chemotherapy with Docetaxel and Cyramza every 3 weeks with Neulasta support, last infusion on 05/23/2016. Her oncologist was notified of her admission.    Hypokalemia Continue to replete. Check magnesium in the morning.   Family Communication/Anticipated D/C date and plan/Code Status   DVT prophylaxis: Lovenox ordered. Code Status: DO NOT RESUSCITATE Family Communication: No family currently at the bedside. Disposition Plan: Home when able to transition to oral antibiotics and prednisone, likely several more days.   Medical Consultants:    Oncology   Procedures:    None.  Anti-Infectives:   Rocephin 05/28/16---> 05/29/16 Vancomycin 05/31/16---> Cefepime 05/31/16--->  Subjective:   Destiny Harrison is more short of breath today and says her cough is worse. Denies pain. Continues to have some nausea and diminished appetite.  Objective:    Vitals:   06/01/16 2042 06/02/16 0629 06/02/16 0847 06/02/16 1349  BP: (!) 123/55 (!) 117/48  (!) 151/63  Pulse: (!) 111 (!) 118  (!) 117  Resp: '18 18  20  '$ Temp: 98.8 F (37.1 C) 99 F (37.2 C)  98.4 F (36.9 C)  TempSrc: Oral Oral  Oral  SpO2: 96% 94% 92% 95%  Weight:      Height:        Intake/Output Summary (Last 24  hours) at 06/02/16 1409 Last data filed at 06/02/16 0600  Gross per 24 hour  Intake           1417.5 ml  Output              250 ml    Net           1167.5 ml   Filed Weights   05/28/16 1500 05/28/16 1746  Weight: 52.2 kg (115 lb) 48.2 kg (106 lb 4.8 oz)    Exam: General exam: Appears calm, Nontoxic appearing. Respiratory system: Scattered rhonchi and wheezes. Respiratory effort increasing. Cardiovascular system: S1 & S2 heard, tachycardic. No JVD,  rubs, gallops or clicks. No murmurs. Gastrointestinal system: Abdomen is nondistended, soft and nontender. No organomegaly or masses felt. Normal bowel sounds heard. Central nervous system: Alert and oriented. No focal neurological deficits. Extremities: No clubbing,  or cyanosis. No edema. Skin: No rashes, lesions or ulcers. Psychiatry: Judgement and insight appear normal. Mood & affect appropriate.   Data Reviewed:   I have personally reviewed following labs and imaging studies:  Labs: Basic Metabolic Panel:  Recent Labs Lab 05/29/16 0425 05/30/16 0410 05/31/16 0410 06/01/16 0400 06/02/16 0830  NA 136 137 140 137 136  K 4.5 3.7 3.2* 3.3* 3.4*  CL 103 104 104 99* 97*  CO2 '27 27 28 '$ 32 32  GLUCOSE 72 91 75 94 95  BUN 6 5* <5* <5* 5*  CREATININE 0.53 0.58 0.60 0.50 0.58  CALCIUM 7.6* 7.4* 7.8* 8.2* 8.3*   GFR Estimated Creatinine Clearance: 45.5 mL/min (by C-G formula based on SCr of 0.8 mg/dL). Liver Function Tests:  Recent Labs Lab 05/27/16 1544 05/28/16 1451  AST 14* 14*  ALT 12* 11*  ALKPHOS 69 89  BILITOT 0.8 1.1  PROT 5.8* 6.8  ALBUMIN 2.9* 3.4*    Recent Labs Lab 05/27/16 1544 05/28/16 1451  LIPASE 26 18   CBC:  Recent Labs Lab 05/27/16 1544  05/29/16 0425 05/30/16 0410 05/31/16 0410 06/01/16 0400 06/02/16 0830  WBC 28.7*  < > 4.7 11.4* 35.0* 46.9* 40.9*  NEUTROABS 27.2*  --   --   --   --   --   --   HGB 8.6*  < > 8.1* 7.9* 8.0* 8.4* 8.0*  HCT 26.2*  < > 24.6* 24.4* 24.7* 25.7* 24.6*  MCV 81.6  < > 80.9 80.3 81.5 80.6 80.4  PLT 505*  < > 394 371 385 394 315  < > = values in this interval not displayed.  Sepsis  Labs:  Recent Labs Lab 05/28/16 1504  05/30/16 0410 05/31/16 0410 05/31/16 0910 06/01/16 0400 06/02/16 0830  WBC  --   < > 11.4* 35.0*  --  46.9* 40.9*  LATICACIDVEN 1.82  --   --   --  1.6  --   --   < > = values in this interval not displayed. Urine analysis:    Component Value Date/Time   COLORURINE YELLOW 05/27/2016 [place] 05/27/2016 1659   LABSPEC 1.011 05/27/2016 1659   PHURINE 7.0 05/27/2016 1659   GLUCOSEU NEGATIVE 05/27/2016 1659   HGBUR NEGATIVE 05/27/2016 1659   BILIRUBINUR NEGATIVE 05/27/2016 1659   KETONESUR NEGATIVE 05/27/2016 1659   PROTEINUR NEGATIVE 05/27/2016 1659   UROBILINOGEN 0.2 08/18/2015 0730   NITRITE NEGATIVE 05/27/2016 1659   LEUKOCYTESUR SMALL (A) 05/27/2016 1659   Microbiology Recent Results (from the past 240 hour(s))  Urine culture     Status: Abnormal   Collection  Time: 05/27/16  5:00 PM  Result Value Ref Range Status   Specimen Description URINE, RANDOM  Final   Special Requests NONE  Final   Culture MULTIPLE SPECIES PRESENT, SUGGEST RECOLLECTION (A)  Final   Report Status 05/29/2016 FINAL  Final  Culture, blood (Routine X 2) w Reflex to ID Panel     Status: None (Preliminary result)   Collection Time: 05/31/16  9:50 AM  Result Value Ref Range Status   Specimen Description BLOOD LEFT ARM  Final   Special Requests IN PEDIATRIC BOTTLE 3 CC  Final   Culture   Final    NO GROWTH 2 DAYS Performed at [facility]    Report Status PENDING  Incomplete  Culture, blood (Routine X 2) w Reflex to ID Panel     Status: None (Preliminary result)   Collection Time: 05/31/16 10:08 AM  Result Value Ref Range Status   Specimen Description BLOOD RIGHT ARM  Final   Special Requests BOTTLES DRAWN AEROBIC AND ANAEROBIC 6 CC EA  Final   Culture   Final    NO GROWTH 2 DAYS Performed at [facility]    Report Status PENDING  Incomplete  Culture, expectorated sputum-assessment     Status: None   Collection Time:  05/31/16 11:51 AM  Result Value Ref Range Status   Specimen Description SPUTUM  Final   Special Requests Immunocompromised  Final   Sputum evaluation   Final    THIS SPECIMEN IS ACCEPTABLE. RESPIRATORY CULTURE REPORT TO FOLLOW.   Report Status 05/31/2016 FINAL  Final  Culture, respiratory (NON-Expectorated)     Status: None   Collection Time: 05/31/16 11:51 AM  Result Value Ref Range Status   Specimen Description SPUTUM  Final   Special Requests NONE  Final   Gram Stain   Final    RARE WBC PRESENT, PREDOMINANTLY PMN MODERATE GRAM POSITIVE COCCI IN PAIRS FEW GRAM NEGATIVE RODS Performed at [facility], NONE PREDOMINANT  Final   Report Status 06/02/2016 FINAL  Final    Radiology: Dg Chest 2 View  Result Date: 05/28/2016 CLINICAL DATA:  SOB increasing x 2 days - currently being treated for lung cancer - hx COPD - hx asthma EXAM: CHEST  2 VIEW COMPARISON:  05/07/2016 FINDINGS: The cardiac silhouette is normal in size. No mediastinal masses or convincing adenopathy. Coarse reticular type opacity extends from superior left hilum. There is fullness from the inferior right hilum. These findings stable. Lungs are hyperexpanded but otherwise clear. No pleural effusion or pneumothorax. Right anterior chest wall Port-A-Cath is stable. Bony thorax is demineralized. Mild compression fracture of a mid to upper thoracic vertebra, also stable. No convincing osteoblastic or osteolytic lesions. IMPRESSION: No acute cardiopulmonary disease. No significant change from the previous chest radiograph. Electronically Signed   By: Lajean Manes M.D.   On: 05/28/2016 14:49   Ct Angio Chest Pe W Or Wo Contrast  Result Date: 06/01/2016 CLINICAL DATA:  Hemoptysis EXAM: CT ANGIOGRAPHY CHEST WITH CONTRAST TECHNIQUE: Multidetector CT imaging of the chest was performed using the standard protocol during bolus administration of intravenous contrast. Multiplanar CT image  reconstructions and MIPs were obtained to evaluate the vascular anatomy. CONTRAST:  100 cc Isovue 370 COMPARISON:  02/12/2016 FINDINGS: There are no filling defects in the pulmonary arterial tree to suggest acute pulmonary thromboembolism. Right peritracheal mass in the upper mediastinum measures 3.2 x 3.1 cm and previously measured 6.4 x 4.5 cm. Is  improved. Ill-defined calcifications persist within the mass. Post radiation changes in the medial left lung are stable. Soft tissue prominence in the medial right middle lobe have improved. A focal ground-glass opacity in the lateral basal right lower lobe on image 63 measures 10 mm. Small pleural effusions have developed. Bilateral dependent atelectasis. Partial solid nodule in the left lower lobe measures 5 mm on image 49. There is fluid or mucus material in left lower lobe airways extending to the posterior basal segment. See image 49. Severe emphysema persists. Interlobular septa are more prominent suggesting interstitial edema or pneumonia. Biapical scarring is stable. No pneumothorax. Right jugular Port-A-Cath is stable. Right ventricle is dilated. Diffuse hepatic steatosis. Calcified granuloma in the right lobe of the liver. Tiny hypodensities in the lateral spleen are nonspecific. Mid-level thoracic compression deformities are stable. This includes T4, T5, and probably T6. Review of the MIP images confirms the above findings. IMPRESSION: No evidence of acute pulmonary thromboembolism New small bilateral pleural effusions with dependent atelectasis New sub cm part solid left lower lobe nodule. New 10 mm ground-glass opacity in the right lower lobe. Central right middle lobe mass and large right paratracheal mass are improved. New interstitial changes suggesting interstitial edema or pneumonia. Electronically Signed   By: Marybelle Killings M.D.   On: 06/01/2016 09:49   Dg Chest Port 1 View  Result Date: 05/31/2016 CLINICAL DATA:  Lung cancer, dyspnea EXAM: PORTABLE  CHEST 1 VIEW COMPARISON:  05/28/2016 FINDINGS: Cardiomediastinal silhouette is stable. Hyperinflation again noted. Again noted right paratracheal adenopathy. Stable left perihilar postradiation changes. No definite superimposed infiltrate or pulmonary edema. Mild perihilar bronchitic changes. Right IJ Port-A-Cath is unchanged in position. IMPRESSION: Hyperinflation again noted. Again noted right paratracheal adenopathy. Stable left perihilar postradiation changes. No definite superimposed infiltrate or pulmonary edema. Mild perihilar bronchitic changes. Electronically Signed   By: Lahoma Crocker M.D.   On: 05/31/2016 10:12    Medications:   . budesonide  0.25 mg Nebulization BID  . ceFEPime (MAXIPIME) IV  2 g Intravenous Q24H  . diltiazem  180 mg Oral QPM  . enoxaparin (LOVENOX) injection  40 mg Subcutaneous Q24H  . famotidine  20 mg Oral BID  . feeding supplement (ENSURE ENLIVE)  237 mL Oral BID BM  . guaiFENesin  1,200 mg Oral BID  . levalbuterol  0.63 mg Nebulization TID  . magnesium oxide  400 mg Oral BID  . methylPREDNISolone (SOLU-MEDROL) injection  60 mg Intravenous Q6H  . morphine  15 mg Oral Q12H  . polyethylene glycol  17 g Oral Daily  . potassium chloride  20 mEq Oral TID  . sodium chloride flush  3 mL Intravenous Q12H  . vancomycin  1,000 mg Intravenous Q24H   Continuous Infusions: . sodium chloride 50 mL/hr at 05/31/16 0541    Time spent: 35 minutes.  The patient is medically complex with multiple co-morbidities and is at high risk for clinical deterioration and requires high complexity decision making.    LOS: 5 days   [facility] Pager [phone]. If unable to reach me by pager, please call my cell phone at [phone].  *Please refer to [email or web address], password [email or web address] to get updated schedule on who will round on this patient, as hospitalists switch teams weekly. If 7PM-7AM, please contact night-coverage at [email or web address], password [email or web address] for any overnight  needs.  06/02/2016, 2:09 PM

## 2016-06-03 LAB — BASIC METABOLIC PANEL
Anion gap: 7 (ref 5–15)
BUN: 6 mg/dL (ref 6–20)
CHLORIDE: 98 mmol/L — AB (ref 101–111)
CO2: 32 mmol/L (ref 22–32)
Calcium: 8.6 mg/dL — ABNORMAL LOW (ref 8.9–10.3)
Creatinine, Ser: 0.43 mg/dL — ABNORMAL LOW (ref 0.44–1.00)
GFR calc Af Amer: >60 mL/min (ref >60)
GFR calc non Af Amer: >60 mL/min (ref >60)
Glucose, Bld: 138 mg/dL — ABNORMAL HIGH (ref 65–99)
POTASSIUM: 4.7 mmol/L (ref 3.5–5.1)
SODIUM: 137 mmol/L (ref 135–145)

## 2016-06-03 LAB — CBC
HEMATOCRIT: 27.1 % — AB (ref 36.0–46.0)
HEMOGLOBIN: 8.9 g/dL — AB (ref 12.0–15.0)
MCH: 26.2 pg (ref 26.0–34.0)
MCHC: 32.8 g/dL (ref 30.0–36.0)
MCV: 79.7 fL (ref 78.0–100.0)
Platelets: 374 10*3/uL (ref 150–400)
RBC: 3.4 MIL/uL — AB (ref 3.87–5.11)
RDW: 17.7 % — ABNORMAL HIGH (ref 11.5–15.5)
WBC: 37.5 10*3/uL — ABNORMAL HIGH (ref 4.0–10.5)

## 2016-06-03 LAB — MAGNESIUM: MAGNESIUM: 1.4 mg/dL — AB (ref 1.7–2.4)

## 2016-06-03 LAB — VANCOMYCIN, TROUGH: Vancomycin Tr: 6 ug/mL — ABNORMAL LOW (ref 15–20)

## 2016-06-03 MED ORDER — VANCOMYCIN HCL IN DEXTROSE 1-5 GM/200ML-% IV SOLN: 1000.0000 mg | Freq: Two times a day (BID) | INTRAVENOUS | Status: DC

## 2016-06-03 MED ORDER — VANCOMYCIN HCL IN DEXTROSE 750-5 MG/150ML-% IV SOLN
750.0000 mg | Freq: Two times a day (BID) | INTRAVENOUS | Status: AC
  Administered 2016-06-04 – 2016-06-06 (×6): 750 mg via INTRAVENOUS
  Filled 2016-06-03 (×6): qty 150

## 2016-06-03 MED ORDER — DILTIAZEM HCL ER 240 MG PO CP24
240.0000 mg | ORAL_CAPSULE | Freq: Every evening | ORAL | Status: DC
  Administered 2016-06-03 – 2016-06-05 (×3): 240 mg via ORAL
  Filled 2016-06-03 (×7): qty 1

## 2016-06-03 MED ORDER — METOPROLOL TARTRATE 5 MG/5ML IV SOLN
2.5000 mg | Freq: Once | INTRAVENOUS | Status: AC
  Administered 2016-06-03: 2.5 mg via INTRAVENOUS
  Filled 2016-06-03: qty 5

## 2016-06-03 MED ORDER — MAGNESIUM SULFATE 2 GM/50ML IV SOLN
2.0000 g | Freq: Once | INTRAVENOUS | Status: AC
Start: 2016-06-03 — End: 2016-06-03
  Administered 2016-06-03: 2 g via INTRAVENOUS
  Filled 2016-06-03: qty 50

## 2016-06-03 MED ORDER — BOOST PLUS PO LIQD
237.0000 mL | Freq: Two times a day (BID) | ORAL | Status: DC
  Administered 2016-06-03 – 2016-06-05 (×5): 237 mL via ORAL
  Filled 2016-06-03 (×8): qty 237

## 2016-06-03 MED ORDER — LEVALBUTEROL HCL 0.63 MG/3ML IN NEBU
0.6300 mg | INHALATION_SOLUTION | RESPIRATORY_TRACT | Status: DC | PRN
Start: 2016-06-03 — End: 2016-06-06

## 2016-06-03 MED ORDER — DILTIAZEM HCL 25 MG/5ML IV SOLN
10.0000 mg | Freq: Four times a day (QID) | INTRAVENOUS | Status: DC | PRN
  Administered 2016-06-03 (×2): 10 mg via INTRAVENOUS
  Filled 2016-06-03 (×4): qty 5

## 2016-06-03 MED ORDER — CEFEPIME HCL 1 G IJ SOLR
1.0000 g | Freq: Two times a day (BID) | INTRAMUSCULAR | Status: AC
  Administered 2016-06-03 – 2016-06-06 (×6): 1 g via INTRAVENOUS
  Filled 2016-06-03 (×6): qty 1

## 2016-06-03 MED ORDER — POTASSIUM CHLORIDE 20 MEQ/15ML (10%) PO SOLN
20.0000 meq | Freq: Every day | ORAL | Status: DC
  Administered 2016-06-03 – 2016-06-06 (×4): 20 meq via ORAL
  Filled 2016-06-03 (×4): qty 15

## 2016-06-03 NOTE — Progress Notes (Signed)
Progress Note    Destiny Harrison  WHQ:[id number] DOB: 03/08/1941  DOA: 05/28/2016 PCP: Thressa Sheller, MD    Brief Narrative:   Destiny Harrison is an 75 y.o. female with a past medical history metastatic non-small cell lung cancer diagnosed in March 2013 status post palliative radiation therapy to left lung mass, systemic therapy with carboplatin and Abraxane, also receiving immunotherapy with Nivolumab in 2016, currently receiving chemotherapy with Docetaxel and Cyramza every 3 weeks with Neulasta support, under the care of Dr. Julien Nordmann, Who was admitted 05/28/16 with a chief complaint of nausea/vomiting. Her last dose of chemotherapy was 05/23/16. She was evaluated in the ED on 05/27/16 where she received IV fluids and anti-medics and was discharged home.  Assessment/Plan:   Principal Problems:   Chemotherapy-induced enteritis/Nausea and vomiting/dehydration Continue supportive management with IV fluids and anti-emetics. No evidence of obstruction. Continue IV Pepcid twice a day. Tolerating a soft diet.   Sepsis secondary to HCAP in an immunocompromised host The patient had a marked increase in her WBC count on 05/31/16, which was associated with tachycardia and tachypnea. She was not hypotensive or febrile. Lactic acid was 1.6. Chest x-ray was negative for obvious pneumonia. Urine cultures done on admission were negative. The patient was subsequently placed on empiric vancomycin and cefepime given her immunocompromised status. Repeat blood cultures and sputum culture were done. CT angiogram done 06/01/16, negative for pulmonary embolism but did show interstitial infiltrates consistent with pneumonia. Continue broad-spectrum antibiotics.  Active Problems:   Right upper extremity swelling Upper extremity venous duplex negative.    Severe protein calorie malnutrition Evaluated by dietitian 05/29/16. Supplements ordered.    Anti-neoplastic chemotherapy-induced anemia Monitor hemoglobin. Currently 8.9  mg/dL. No indication for transfusion, but could consider if she becomes symptomatic or hemoglobin drops less than 7.    Hypertension  Continue Cardizem.    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 Continue supplemental oxygen. Continue Solu-Medrol, wean as tolerated..    Primary cancer of right upper lobe of lung (HCC)/non-small cell lung cancer She currently receives her care at [facility], follows Dr. Julien Nordmann. Currently receiving chemotherapy with Docetaxel and Cyramza every 3 weeks with Neulasta support, last infusion on 05/23/2016. Her oncologist was notified of her admission.    Hypokalemia/hypomagnesemia Monitor and replace electrolytes as needed. We'll give 2 g of magnesium today.   Family Communication/Anticipated D/C date and plan/Code Status   DVT prophylaxis: Lovenox ordered. Code Status: DO NOT RESUSCITATE Family Communication: No family currently at the bedside. Disposition Plan: Home when able to transition to oral antibiotics and prednisone, likely several more days.   Medical Consultants:    Oncology   Procedures:    None.  Anti-Infectives:   Rocephin 05/28/16---> 05/29/16 Vancomycin 05/31/16---> Cefepime 05/31/16--->  Subjective:   Destiny Harrison has less shortness of breath today, but continues to have a cough. Reports her sputum is clear but having blood-tinged flecks in it. Appetite is fair. No nausea or vomiting. No significant chest pains. Bowels are moving. Having episodes of heart rate elevation into the 140s.  Objective:    Vitals:   06/02/16 2020 06/02/16 2127 06/03/16 0419 06/03/16 0834  BP:  138/77 (!) 147/87   Pulse:  (!) 122 (!) 108 (!) 127  Resp:  20 18 (!) 22  Temp:  98.3 F (36.8 C) 98 F (36.7 C)   TempSrc:  Oral Oral   SpO2: 98% 94% 97% 99%  Weight:  Height:        Intake/Output Summary (Last 24 hours) at 06/03/16 0915 Last data filed at  06/03/16 0300  Gross per 24 hour  Intake             1500 ml  Output              400 ml  Net             1100 ml   Filed Weights   05/28/16 1500 05/28/16 1746  Weight: 52.2 kg (115 lb) 48.2 kg (106 lb 4.8 oz)    Exam: General exam: Appears calm, Nontoxic appearing. Respiratory system: Rhonchi/wheezes less prominent. Respiratory effort improved. Cardiovascular system: S1 & S2 heard, tachycardic. No JVD,  rubs, gallops or clicks. No murmurs. Gastrointestinal system: Abdomen is nondistended, soft and nontender. No organomegaly or masses felt. Normal bowel sounds heard. Central nervous system: Alert and oriented. No focal neurological deficits. Extremities: No clubbing,  or cyanosis. No edema. Skin: No rashes, lesions or ulcers. Psychiatry: Judgement and insight appear normal. Mood & affect appropriate.   Data Reviewed:   I have personally reviewed following labs and imaging studies:  Labs: Basic Metabolic Panel:  Recent Labs Lab 05/30/16 0410 05/31/16 0410 06/01/16 0400 06/02/16 0830 06/03/16 0500  NA 137 140 137 136 137  K 3.7 3.2* 3.3* 3.4* 4.7  CL 104 104 99* 97* 98*  CO2 27 28 32 32 32  GLUCOSE 91 75 94 95 138*  BUN 5* <5* <5* 5* 6  CREATININE 0.58 0.60 0.50 0.58 0.43*  CALCIUM 7.4* 7.8* 8.2* 8.3* 8.6*  MG  --   --   --   --  1.4*   GFR Estimated Creatinine Clearance: 45.5 mL/min (by C-G formula based on SCr of 0.8 mg/dL). Liver Function Tests:  Recent Labs Lab 05/27/16 1544 05/28/16 1451  AST 14* 14*  ALT 12* 11*  ALKPHOS 69 89  BILITOT 0.8 1.1  PROT 5.8* 6.8  ALBUMIN 2.9* 3.4*    Recent Labs Lab 05/27/16 1544 05/28/16 1451  LIPASE 26 18   CBC:  Recent Labs Lab 05/27/16 1544  05/30/16 0410 05/31/16 0410 06/01/16 0400 06/02/16 0830 06/03/16 0500  WBC 28.7*  < > 11.4* 35.0* 46.9* 40.9* 37.5*  NEUTROABS 27.2*  --   --   --   --   --   --   HGB 8.6*  < > 7.9* 8.0* 8.4* 8.0* 8.9*  HCT 26.2*  < > 24.4* 24.7* 25.7* 24.6* 27.1*  MCV 81.6  <  > 80.3 81.5 80.6 80.4 79.7  PLT 505*  < > 371 385 394 315 374  < > = values in this interval not displayed.  Sepsis Labs:  Recent Labs Lab 05/28/16 1504  05/31/16 0410 05/31/16 0910 06/01/16 0400 06/02/16 0830 06/03/16 0500  WBC  --   < > 35.0*  --  46.9* 40.9* 37.5*  LATICACIDVEN 1.82  --   --  1.6  --   --   --   < > = values in this interval not displayed. Urine analysis:    Component Value Date/Time   COLORURINE YELLOW 05/27/2016 [place] 05/27/2016 1659   LABSPEC 1.011 05/27/2016 1659   PHURINE 7.0 05/27/2016 1659   GLUCOSEU NEGATIVE 05/27/2016 1659   HGBUR NEGATIVE 05/27/2016 1659   BILIRUBINUR NEGATIVE 05/27/2016 1659   KETONESUR NEGATIVE 05/27/2016 1659   PROTEINUR NEGATIVE 05/27/2016 1659   UROBILINOGEN 0.2 08/18/2015 0730   NITRITE NEGATIVE 05/27/2016 1659  LEUKOCYTESUR SMALL (A) 05/27/2016 1659   Microbiology Recent Results (from the past 240 hour(s))  Urine culture     Status: Abnormal   Collection Time: 05/27/16  5:00 PM  Result Value Ref Range Status   Specimen Description URINE, RANDOM  Final   Special Requests NONE  Final   Culture MULTIPLE SPECIES PRESENT, SUGGEST RECOLLECTION (A)  Final   Report Status 05/29/2016 FINAL  Final  Culture, blood (Routine X 2) w Reflex to ID Panel     Status: None (Preliminary result)   Collection Time: 05/31/16  9:50 AM  Result Value Ref Range Status   Specimen Description BLOOD LEFT ARM  Final   Special Requests IN PEDIATRIC BOTTLE 3 CC  Final   Culture   Final    NO GROWTH 2 DAYS Performed at [facility]    Report Status PENDING  Incomplete  Culture, blood (Routine X 2) w Reflex to ID Panel     Status: None (Preliminary result)   Collection Time: 05/31/16 10:08 AM  Result Value Ref Range Status   Specimen Description BLOOD RIGHT ARM  Final   Special Requests BOTTLES DRAWN AEROBIC AND ANAEROBIC 6 CC EA  Final   Culture   Final    NO GROWTH 2 DAYS Performed at [facility]     Report Status PENDING  Incomplete  Culture, expectorated sputum-assessment     Status: None   Collection Time: 05/31/16 11:51 AM  Result Value Ref Range Status   Specimen Description SPUTUM  Final   Special Requests Immunocompromised  Final   Sputum evaluation   Final    THIS SPECIMEN IS ACCEPTABLE. RESPIRATORY CULTURE REPORT TO FOLLOW.   Report Status 05/31/2016 FINAL  Final  Culture, respiratory (NON-Expectorated)     Status: None   Collection Time: 05/31/16 11:51 AM  Result Value Ref Range Status   Specimen Description SPUTUM  Final   Special Requests NONE  Final   Gram Stain   Final    RARE WBC PRESENT, PREDOMINANTLY PMN MODERATE GRAM POSITIVE COCCI IN PAIRS FEW GRAM NEGATIVE RODS Performed at [facility], NONE PREDOMINANT  Final   Report Status 06/02/2016 FINAL  Final    Radiology: Dg Chest 2 View  Result Date: 05/28/2016 CLINICAL DATA:  SOB increasing x 2 days - currently being treated for lung cancer - hx COPD - hx asthma EXAM: CHEST  2 VIEW COMPARISON:  05/07/2016 FINDINGS: The cardiac silhouette is normal in size. No mediastinal masses or convincing adenopathy. Coarse reticular type opacity extends from superior left hilum. There is fullness from the inferior right hilum. These findings stable. Lungs are hyperexpanded but otherwise clear. No pleural effusion or pneumothorax. Right anterior chest wall Port-A-Cath is stable. Bony thorax is demineralized. Mild compression fracture of a mid to upper thoracic vertebra, also stable. No convincing osteoblastic or osteolytic lesions. IMPRESSION: No acute cardiopulmonary disease. No significant change from the previous chest radiograph. Electronically Signed   By: Lajean Manes M.D.   On: 05/28/2016 14:49   Ct Angio Chest Pe W Or Wo Contrast  Result Date: 06/01/2016 CLINICAL DATA:  Hemoptysis EXAM: CT ANGIOGRAPHY CHEST WITH CONTRAST TECHNIQUE: Multidetector CT imaging of the chest was  performed using the standard protocol during bolus administration of intravenous contrast. Multiplanar CT image reconstructions and MIPs were obtained to evaluate the vascular anatomy. CONTRAST:  100 cc Isovue 370 COMPARISON:  02/12/2016 FINDINGS: There are no filling defects in the pulmonary  arterial tree to suggest acute pulmonary thromboembolism. Right peritracheal mass in the upper mediastinum measures 3.2 x 3.1 cm and previously measured 6.4 x 4.5 cm. Is improved. Ill-defined calcifications persist within the mass. Post radiation changes in the medial left lung are stable. Soft tissue prominence in the medial right middle lobe have improved. A focal ground-glass opacity in the lateral basal right lower lobe on image 63 measures 10 mm. Small pleural effusions have developed. Bilateral dependent atelectasis. Partial solid nodule in the left lower lobe measures 5 mm on image 49. There is fluid or mucus material in left lower lobe airways extending to the posterior basal segment. See image 49. Severe emphysema persists. Interlobular septa are more prominent suggesting interstitial edema or pneumonia. Biapical scarring is stable. No pneumothorax. Right jugular Port-A-Cath is stable. Right ventricle is dilated. Diffuse hepatic steatosis. Calcified granuloma in the right lobe of the liver. Tiny hypodensities in the lateral spleen are nonspecific. Mid-level thoracic compression deformities are stable. This includes T4, T5, and probably T6. Review of the MIP images confirms the above findings. IMPRESSION: No evidence of acute pulmonary thromboembolism New small bilateral pleural effusions with dependent atelectasis New sub cm part solid left lower lobe nodule. New 10 mm ground-glass opacity in the right lower lobe. Central right middle lobe mass and large right paratracheal mass are improved. New interstitial changes suggesting interstitial edema or pneumonia. Electronically Signed   By: Marybelle Killings M.D.   On:  06/01/2016 09:49   Dg Chest Port 1 View  Result Date: 05/31/2016 CLINICAL DATA:  Lung cancer, dyspnea EXAM: PORTABLE CHEST 1 VIEW COMPARISON:  05/28/2016 FINDINGS: Cardiomediastinal silhouette is stable. Hyperinflation again noted. Again noted right paratracheal adenopathy. Stable left perihilar postradiation changes. No definite superimposed infiltrate or pulmonary edema. Mild perihilar bronchitic changes. Right IJ Port-A-Cath is unchanged in position. IMPRESSION: Hyperinflation again noted. Again noted right paratracheal adenopathy. Stable left perihilar postradiation changes. No definite superimposed infiltrate or pulmonary edema. Mild perihilar bronchitic changes. Electronically Signed   By: Lahoma Crocker M.D.   On: 05/31/2016 10:12    Medications:   . budesonide  0.25 mg Nebulization BID  . ceFEPime (MAXIPIME) IV  2 g Intravenous Q24H  . diltiazem  180 mg Oral QPM  . enoxaparin (LOVENOX) injection  40 mg Subcutaneous Q24H  . famotidine  20 mg Oral BID  . feeding supplement (ENSURE ENLIVE)  237 mL Oral BID BM  . guaiFENesin  1,200 mg Oral BID  . levalbuterol  0.63 mg Nebulization TID  . magnesium oxide  400 mg Oral BID  . methylPREDNISolone (SOLU-MEDROL) injection  60 mg Intravenous Q6H  . morphine  15 mg Oral Q12H  . polyethylene glycol  17 g Oral Daily  . potassium chloride  20 mEq Oral TID  . sodium chloride flush  3 mL Intravenous Q12H  . vancomycin  1,000 mg Intravenous Q24H   Continuous Infusions: . sodium chloride 50 mL/hr at 06/03/16 0113    Time spent: 35 minutes.  The patient is medically complex with multiple co-morbidities and is at high risk for clinical deterioration and requires high complexity decision making.    LOS: 6 days   [facility] Pager [phone]. If unable to reach me by pager, please call my cell phone at [phone].  *Please refer to [email or web address], password [email or web address] to get updated schedule on who will round on this patient, as hospitalists  switch teams weekly. If 7PM-7AM, please contact night-coverage at [email or web address], password [email or web address] for any overnight needs.  06/03/2016, 9:15 AM

## 2016-06-03 NOTE — Progress Notes (Signed)
Physical Therapy Treatment Patient Details Name: Destiny Harrison MRN: [id number] DOB: July 02, 1941 Today's Date: 06/03/2016    History of Present Illness 75 y.o. female with a past medical history metastatic non-small cell lung cancer diagnosed in March 2013, COPD, RA and admitted for nausea/vomiting and Chemotherapy-induced enteritis    PT Comments    Pt with elevated HR up to 153 bpm with gait, therefore did not walk as far today. Nursing aware and had been in touch with MD.    Follow Up Recommendations  No PT follow up     Equipment Recommendations  None recommended by PT    Recommendations for Other Services       Precautions / Restrictions Precautions Precaution Comments: monitor HR, chronic 2L O2 Restrictions Weight Bearing Restrictions: No    Mobility  Bed Mobility Overal bed mobility: Modified Independent                Transfers Overall transfer level: Needs assistance Equipment used: None Transfers: Sit to/from Stand Sit to Stand: Supervision            Ambulation/Gait Ambulation/Gait assistance: Min guard Ambulation Distance (Feet): 85 Feet Assistive device:  (pushing o2 tank) Gait Pattern/deviations: Decreased step length - left;Decreased step length - right Gait velocity: decreased   General Gait Details: Tachycardic during ambulation up to 153 bpm. 2 standing rest breaks. Nursing aware.   Stairs            Wheelchair Mobility    Modified Rankin (Stroke Patients Only)       Balance                                    Cognition Arousal/Alertness: Awake/alert Behavior During Therapy: WFL for tasks assessed/performed Overall Cognitive Status: Within Functional Limits for tasks assessed                      Exercises      General Comments        Pertinent Vitals/Pain Pain Assessment: No/denies pain    Home Living                      Prior Function            PT Goals (current goals  can now be found in the care plan section) Acute Rehab PT Goals PT Goal Formulation: With patient Time For Goal Achievement: 06/06/16 Potential to Achieve Goals: Good Progress towards PT goals: Not progressing toward goals - comment (elevated HR today)    Frequency  Min 3X/week    PT Plan Current plan remains appropriate    Co-evaluation             End of Session Equipment Utilized During Treatment: Oxygen Activity Tolerance: Treatment limited secondary to medical complications (Comment);Patient limited by fatigue (elevated HR) Patient left: in bed;with bed alarm set;with family/visitor present;with call bell/phone within reach     Time: 1440-1452 PT Time Calculation (min) (ACUTE ONLY): 12 min  Charges:  $Gait Training: 8-22 mins                    G Codes:      Clinard,Tinslee Klare LUBECK 06/03/2016, 3:13 PM

## 2016-06-03 NOTE — Progress Notes (Signed)
PT Cancellation Note  Patient Details Name: Destiny Harrison MRN: [id number] DOB: 02-Apr-1941   Cancelled Treatment:    Reason Eval/Treat Not Completed: Fatigue/lethargy limiting ability to participate. Pt SOB and currently getting up to [facility] with nursing. Will check back later.   Eisenhower,Casha Estupinan LUBECK 06/03/2016, 1:06 PM

## 2016-06-03 NOTE — Progress Notes (Signed)
Nutrition Follow-up  DOCUMENTATION CODES:   Severe malnutrition in context of acute illness/injury, Underweight  INTERVENTION:  - Will d/c Ensure Enlive and order Boost Plus BID per pt preference; each supplement provides 360 kcal and 14 grams of protein. - Continue to encourage PO intakes of meals and supplements. - RD will continue to monitor for additional needs.  NUTRITION DIAGNOSIS:   Inadequate protein intake related to other (see comment) (current diet order ) as evidenced by other (see comment) (CLD does not meet estimated protein needs.). -resolving with diet advancement and improving intakes.  GOAL:   Patient will meet greater than or equal to 90% of their needs -minimally met on average.  MONITOR:   PO intake, Supplement acceptance, Weight trends, Labs, I & O's  ASSESSMENT:   75 y.o. female with a past medical history metastatic non-small cell lung cancer diagnosed in March 2013 status post palliative radiation therapy to left lung mass, systemic therapy with carboplatin and Abraxane, also receiving immunotherapy with Nivolumab in 2016, currently receiving chemotherapy with Docetaxel and Cyramza every 3 weeks with Neulasta support, under the care of Dr. Julien Nordmann. She was last seen by Dr. Julien Nordmann on 05/23/2016. She received her last infusion with chemotherapy on 05/23/2016 at the cancer center then got a dose of Neulasta last Saturday. Then on Sunday she developed intractible nausea and vomiting presenting to the ED on 05/27/2016 where she received IV fluids, IV antiemetic therapy and discharged home.  She reports in the last 24 hours unable to keep even her medications down which has led to uncontrolled pain symptoms (unable to take Norco and MS Contin).   8/14 Diet advanced from CLD to Soft on 8/10 with documented intakes of 75% of breakfast 8/11 and 50% of lunch 8/12 with no other documented intakes since that time. Pt reports that for breakfast this AM she had 50% of [place]  sausage, 75% of breakfast potatoes, and 75% of coffee. She reports N/V has been improving and that she did not experience either of these symptoms this AM but she has had a few episodes of N/V with PO intakes since RD visit on 8/9.   Pt states that she prefers Boost Plus to Ensure; supplement order changed per preference and times for provision per pt request for 1 in the afternoon and 1 before bed. No weight hx available since admission.  Medications reviewed; 400 mg Mag-Ox BID, 2 g IV Mg sulfate x1 dose today, 60 mg IV Solu-medrol QID, PRN IV Zofran, 10 mEq IV KCl x4 runs yesterday, 20 mEq oral KCl/day. Labs reviewed; Cl: 98 mmol/L, creatinine: 0.43 mg/dL, Ca: 8.6 mg/dL, Mg: 1.4 mg/dL. IVF: NS @ 50 mL/hr.    8/9 - Pt states she was able to consume beef broth and jello for lunch with no abdominal pain, N/V.  - Pt requested RD order dinner tray for 1945 arrival: beef broth with salt packets, hot tea with sugar.  - Pt states that since admission N/V has resolved.  - She states that it began on 8/6 and that she had an episode similar to this in March or April of this year.  - Pt states that she has had taste alteration of very bland taste while being on chemo; discussed some tips to try after d/c to assist with this.  - Pt states that at home she has Boost and Ensure available and has prescribed Zofran.  - When N/V began on 8/6 the Zofran did not help and she was unable to keep  anything down, including liquids.  - Physical assessment shows mild fat wasting, moderate to severe muscle wasting.  - Per chart review, pt has lost 13 lbs (11% body weight) in the past 2 months which is significant for time frame.  - Will order Boost Breeze while pt is on CLD and change to Ensure with diet advancement.  IVF: NS @ 125 mL/hr.     Diet Order:  DIET SOFT Room service appropriate? Yes; Fluid consistency: Thin  Skin:  Reviewed, no issues  Last BM:  8/13  Height:   Ht Readings from Last 1  Encounters:  05/28/16 5' 5"  (1.651 m)    Weight:   Wt Readings from Last 1 Encounters:  05/28/16 106 lb 4.8 oz (48.2 kg)    Ideal Body Weight:  56.82 kg  BMI:  Body mass index is 17.69 kg/m.  Estimated Nutritional Needs:   Kcal:  1450-1690 (30-35 kcal/kg)  Protein:  70-80 grams  Fluid:  >/= 1.7 L/day  EDUCATION NEEDS:   No education needs identified at this time    Jarome Matin, MS, RD, LDN Inpatient Clinical Dietitian Pager # [phone] After hours/weekend pager # [phone]

## 2016-06-03 NOTE — Progress Notes (Signed)
Pharmacy Antibiotic Note  Destiny Harrison is a 74 y.o. female admitted on 05/28/2016 with lung cancer currently undergoing chemotherapy treatment with chemotherapy-induced n/v and dehydration.  Ceftriaxone was started on admission for r/o UTI and d/ced after 24 hours. On 8/11, wbc increased sharply to 35.  Pharmacy is consulted to dose broad abx with vancomycin and cefepime for suspected HCAP.  Plan:  Cefepime 1g IV q12h.  Increase to Vancomycin 750 mg IV q12h.  Measure Vanc trough at steady state.  Follow up renal fxn, culture results, and clinical course.   Height: '5\' 5"'$  (165.1 cm) Weight: 106 lb 4.8 oz (48.2 kg) IBW/kg (Calculated) : 57  Temp (24hrs), Avg:98.2 F (36.8 C), Min:98 F (36.7 C), Max:98.4 F (36.9 C)   Recent Labs Lab 05/28/16 1504  05/30/16 0410 05/31/16 0410 05/31/16 0910 06/01/16 0400 06/02/16 0830 06/03/16 0500  WBC  --   < > 11.4* 35.0*  --  46.9* 40.9* 37.5*  CREATININE  --   < > 0.58 0.60  --  0.50 0.58 0.43*  LATICACIDVEN 1.82  --   --   --  1.6  --   --   --   < > = values in this interval not displayed.  Estimated Creatinine Clearance: 45.5 mL/min (by C-G formula based on SCr of 0.8 mg/dL).    Allergies  Allergen Reactions  . Shellfish Allergy Swelling    Antimicrobials this admission: 8/8 CTX >> 8/9 8/11 vanc>> 8/11 cefepime>>  Dose adjustments this admission: 8/14 1100 VT (drawn late at 1255) = 6 on Vanc 1g q24h   Microbiology results: 8/7 ucx: multiple species present 8/7 UA: small leukocytes 8/11 bcx x2: NGTD 8/11 sputum: (gram stain: few GNR, moderate GPC in pairs) Cxt: multiple organisms, none predominant  Thank you for allowing pharmacy to be a part of this patient's care.  Gretta Arab PharmD, BCPS Pager [phone] 06/03/2016 7:44 AM

## 2016-06-04 DIAGNOSIS — C3490 Malignant neoplasm of unspecified part of unspecified bronchus or lung: Secondary | ICD-10-CM

## 2016-06-04 DIAGNOSIS — D72829 Elevated white blood cell count, unspecified: Secondary | ICD-10-CM

## 2016-06-04 DIAGNOSIS — J189 Pneumonia, unspecified organism: Secondary | ICD-10-CM

## 2016-06-04 DIAGNOSIS — R000 Tachycardia, unspecified: Secondary | ICD-10-CM

## 2016-06-04 DIAGNOSIS — Y95 Nosocomial condition: Secondary | ICD-10-CM

## 2016-06-04 LAB — PROCALCITONIN: Procalcitonin: 0.44 ng/mL

## 2016-06-04 LAB — DIFFERENTIAL
BASOS ABS: 0 10*3/uL (ref 0.0–0.1)
Basophils Relative: 0 %
EOS ABS: 0 10*3/uL (ref 0.0–0.7)
Eosinophils Relative: 0 %
LYMPHS ABS: 1.2 10*3/uL (ref 0.7–4.0)
Lymphocytes Relative: 2 %
Monocytes Absolute: 1.8 10*3/uL — ABNORMAL HIGH (ref 0.1–1.0)
Monocytes Relative: 3 %
NEUTROS ABS: 55.4 10*3/uL — AB (ref 1.7–7.7)
NEUTROS PCT: 95 %

## 2016-06-04 LAB — LACTIC ACID, PLASMA
LACTIC ACID, VENOUS: 2.4 mmol/L — AB (ref 0.5–1.9)
Lactic Acid, Venous: 3 mmol/L (ref 0.5–1.9)
Lactic Acid, Venous: 2.3 mmol/L (ref 0.5–1.9)

## 2016-06-04 LAB — BASIC METABOLIC PANEL
Anion gap: 9 (ref 5–15)
BUN: 14 mg/dL (ref 6–20)
CHLORIDE: 98 mmol/L — AB (ref 101–111)
CO2: 31 mmol/L (ref 22–32)
Calcium: 8.8 mg/dL — ABNORMAL LOW (ref 8.9–10.3)
Creatinine, Ser: 0.48 mg/dL (ref 0.44–1.00)
GFR calc Af Amer: >60 mL/min (ref >60)
GFR calc non Af Amer: >60 mL/min (ref >60)
GLUCOSE: 194 mg/dL — AB (ref 65–99)
POTASSIUM: 4.3 mmol/L (ref 3.5–5.1)
SODIUM: 138 mmol/L (ref 135–145)

## 2016-06-04 LAB — URINALYSIS, ROUTINE W REFLEX MICROSCOPIC
Bilirubin Urine: NEGATIVE
Glucose, UA: 100 mg/dL — AB
Hgb urine dipstick: NEGATIVE
KETONES UR: NEGATIVE mg/dL
LEUKOCYTES UA: NEGATIVE
NITRITE: NEGATIVE
PH: 7 (ref 5.0–8.0)
Protein, ur: NEGATIVE mg/dL
SPECIFIC GRAVITY, URINE: 1.01 (ref 1.005–1.030)

## 2016-06-04 LAB — CBC
HEMATOCRIT: 26 % — AB (ref 36.0–46.0)
Hemoglobin: 8.6 g/dL — ABNORMAL LOW (ref 12.0–15.0)
MCH: 26.6 pg (ref 26.0–34.0)
MCHC: 33.1 g/dL (ref 30.0–36.0)
MCV: 80.5 fL (ref 78.0–100.0)
Platelets: 367 10*3/uL (ref 150–400)
RBC: 3.23 MIL/uL — ABNORMAL LOW (ref 3.87–5.11)
RDW: 17.9 % — AB (ref 11.5–15.5)
WBC: 58.4 10*3/uL (ref 4.0–10.5)

## 2016-06-04 MED ORDER — SODIUM CHLORIDE 0.9 % IV BOLUS (SEPSIS)
1500.0000 mL | Freq: Once | INTRAVENOUS | Status: AC
  Administered 2016-06-04: 1500 mL via INTRAVENOUS

## 2016-06-04 NOTE — Progress Notes (Signed)
CRITICAL VALUE ALERT  Critical value received:  Lactic Acid 2.4  Date of notification:  06/04/16  Time of notification:  8786  Critical value read back:Yes.    Nurse who received alert:  Star Age  MD notified (1st page):  Dr Rockne Menghini  Time of first page:  1422  MD notified (2nd page):  Time of second page:  Responding MD:  Dr Rockne Menghini  Time MD responded:  [phone]

## 2016-06-04 NOTE — Progress Notes (Signed)
Progress Note    Destiny Harrison  GGE:[id number] DOB: [birth year]  DOA: 05/28/2016 PCP: Thressa Sheller, MD    Brief Narrative:   Destiny Harrison is an 75 y.o. female with a past medical history metastatic non-small cell lung cancer diagnosed in March 2013 status post palliative radiation therapy to left lung mass, systemic therapy with carboplatin and Abraxane, also receiving immunotherapy with Nivolumab in 2016, currently receiving chemotherapy with Docetaxel and Cyramza every 3 weeks with Neulasta support, under the care of Dr. Julien Nordmann, Who was admitted 05/28/16 with a chief complaint of nausea/vomiting. Her last dose of chemotherapy was 05/23/16. She was evaluated in the ED on 05/27/16 where she received IV fluids and anti-emetics and was discharged home.  Assessment/Plan:   Principal Problems:   Chemotherapy-induced enteritis/Nausea and vomiting/dehydration Continue supportive management with IV fluids and anti-emetics. No evidence of obstruction. Continue IV Pepcid twice a day. Tolerating a soft diet.   Sepsis secondary to HCAP in an immunocompromised host The patient had a marked increase in her WBC count on 05/31/16, which was associated with tachycardia and tachypnea. She was not hypotensive or febrile. Lactic acid was 1.6. Chest x-ray was negative for obvious pneumonia. Urine cultures done on admission were negative. The patient was subsequently placed on empiric vancomycin and cefepime given her immunocompromised status. Repeat blood cultures and sputum culture were done, and are thus far unrevealing. CT angiogram done 06/01/16, negative for pulmonary embolism but did show interstitial infiltrates consistent with pneumonia. Despite treatment with broad-spectrum antibiotics, the patient's WBC count continues to rise, she continues to be tachycardic, and a repeat of her lactic acid shows elevation. She was given 30 mL/kg of IV fluids with improvement in her serum lactate from 3---> 2.4. Pro calcitonin  is 0.44. Will ask ID to assist with further evaluating the possible source of her infection. She does not have any abdominal pain or diarrhea to suggest an intra-abdominal pathology. She did receive Neulasta 10 days ago, so this could be contributory, but does not explain her tachycardia and serum lactate elevation.  Active Problems:   Right upper extremity swelling Upper extremity venous duplex negative.    Severe protein calorie malnutrition Evaluated by dietitian 05/29/16. Supplements ordered.    Anti-neoplastic chemotherapy-induced anemia Monitor hemoglobin. Currently 8.6 mg/dL. No indication for transfusion, but could consider if she becomes symptomatic or hemoglobin drops less than 7.    Hypertension  Continue Cardizem.    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 Continue supplemental oxygen. Continue Solu-Medrol, wean as tolerated..    Primary cancer of right upper lobe of lung (HCC)/non-small cell lung cancer She currently receives her care at the [facility], follows Dr. Julien Nordmann. Currently receiving chemotherapy with Docetaxel and Cyramza every 3 weeks with Neulasta support, last infusion on 05/23/2016. Her oncologist was notified of her admission.    Hypokalemia/hypomagnesemia Monitor and replace electrolytes as needed.    Family Communication/Anticipated D/C date and plan/Code Status   DVT prophylaxis: Lovenox ordered. Code Status: DO NOT RESUSCITATE Family Communication: No family currently at the bedside. Disposition Plan: Home when sepsis resolved, likely another several days.   Medical Consultants:    Oncology  Infectious Disease   Procedures:    None.  Anti-Infectives:   Rocephin 05/28/16---> 05/29/16 Vancomycin 05/31/16---> Cefepime 05/31/16--->  Subjective:   Destiny Harrison has less shortness of breath today, but continues to have a cough. Reports her sputum is clear but  having blood-tinged flecks in it. Appetite  is fair. No nausea or vomiting. No significant chest pains. Bowels are moving. Having episodes of heart rate elevation into the 140s.  Objective:    Vitals:   06/03/16 2022 06/03/16 2050 06/04/16 0704 06/04/16 0850  BP:  125/65 (!) 145/93   Pulse:  (!) 117 (!) 114   Resp:  (!) 22 20   Temp:  98.6 F (37 C) 98 F (36.7 C)   TempSrc:  Oral    SpO2: 97% 98% 100% 98%  Weight:      Height:        Intake/Output Summary (Last 24 hours) at 06/04/16 0900 Last data filed at 06/04/16 0830  Gross per 24 hour  Intake           1121.5 ml  Output             1350 ml  Net           -228.5 ml   Filed Weights   05/28/16 1500 05/28/16 1746  Weight: 52.2 kg (115 lb) 48.2 kg (106 lb 4.8 oz)    Exam: General exam: Appears calm, Nontoxic appearing. Respiratory system: Rhonchi/wheezes less prominent. Respiratory effort improved. Cardiovascular system: S1 & S2 heard, tachycardic. No JVD,  rubs, gallops or clicks. No murmurs. Gastrointestinal system: Abdomen is nondistended, soft and nontender. No organomegaly or masses felt. Normal bowel sounds heard. Central nervous system: Alert and oriented. No focal neurological deficits. Extremities: No clubbing,  or cyanosis. No edema. Skin: No rashes, lesions or ulcers. Psychiatry: Judgement and insight appear normal. Mood & affect appropriate.   Data Reviewed:   I have personally reviewed following labs and imaging studies:  Labs: Basic Metabolic Panel:  Recent Labs Lab 05/31/16 0410 06/01/16 0400 06/02/16 0830 06/03/16 0500 06/04/16 0355  NA 140 137 136 137 138  K 3.2* 3.3* 3.4* 4.7 4.3  CL 104 99* 97* 98* 98*  CO2 28 32 32 32 31  GLUCOSE 75 94 95 138* 194*  BUN <5* <5* 5* 6 14  CREATININE 0.60 0.50 0.58 0.43* 0.48  CALCIUM 7.8* 8.2* 8.3* 8.6* 8.8*  MG  --   --   --  1.4*  --    GFR Estimated Creatinine Clearance: 45.5 mL/min (by C-G formula based on SCr of 0.8 mg/dL). Liver Function  Tests:  Recent Labs Lab 05/28/16 1451  AST 14*  ALT 11*  ALKPHOS 89  BILITOT 1.1  PROT 6.8  ALBUMIN 3.4*    Recent Labs Lab 05/28/16 1451  LIPASE 18   CBC:  Recent Labs Lab 05/31/16 0410 06/01/16 0400 06/02/16 0830 06/03/16 0500 06/04/16 0355  WBC 35.0* 46.9* 40.9* 37.5* 58.4*  HGB 8.0* 8.4* 8.0* 8.9* 8.6*  HCT 24.7* 25.7* 24.6* 27.1* 26.0*  MCV 81.5 80.6 80.4 79.7 80.5  PLT 385 394 315 374 367    Sepsis Labs:  Recent Labs Lab 05/28/16 1504  05/31/16 0910 06/01/16 0400 06/02/16 0830 06/03/16 0500 06/04/16 0355  WBC  --   < >  --  46.9* 40.9* 37.5* 58.4*  LATICACIDVEN 1.82  --  1.6  --   --   --   --   < > = values in this interval not displayed. Urine analysis:    Component Value Date/Time   COLORURINE YELLOW 05/27/2016 [place] 05/27/2016 1659   LABSPEC 1.011 05/27/2016 1659   PHURINE 7.0 05/27/2016 1659   GLUCOSEU NEGATIVE 05/27/2016 1659   HGBUR NEGATIVE 05/27/2016 1659   BILIRUBINUR NEGATIVE 05/27/2016 1659  KETONESUR NEGATIVE 05/27/2016 1659   PROTEINUR NEGATIVE 05/27/2016 1659   UROBILINOGEN 0.2 08/18/2015 0730   NITRITE NEGATIVE 05/27/2016 1659   LEUKOCYTESUR SMALL (A) 05/27/2016 1659   Microbiology Recent Results (from the past 240 hour(s))  Urine culture     Status: Abnormal   Collection Time: 05/27/16  5:00 PM  Result Value Ref Range Status   Specimen Description URINE, RANDOM  Final   Special Requests NONE  Final   Culture MULTIPLE SPECIES PRESENT, SUGGEST RECOLLECTION (A)  Final   Report Status 05/29/2016 FINAL  Final  Culture, blood (Routine X 2) w Reflex to ID Panel     Status: None (Preliminary result)   Collection Time: 05/31/16  9:50 AM  Result Value Ref Range Status   Specimen Description BLOOD LEFT ARM  Final   Special Requests IN PEDIATRIC BOTTLE 3 CC  Final   Culture   Final    NO GROWTH 3 DAYS Performed at [facility]    Report Status PENDING  Incomplete  Culture, blood (Routine X 2) w  Reflex to ID Panel     Status: None (Preliminary result)   Collection Time: 05/31/16 10:08 AM  Result Value Ref Range Status   Specimen Description BLOOD RIGHT ARM  Final   Special Requests BOTTLES DRAWN AEROBIC AND ANAEROBIC 6 CC EA  Final   Culture   Final    NO GROWTH 3 DAYS Performed at [facility]    Report Status PENDING  Incomplete  Culture, expectorated sputum-assessment     Status: None   Collection Time: 05/31/16 11:51 AM  Result Value Ref Range Status   Specimen Description SPUTUM  Final   Special Requests Immunocompromised  Final   Sputum evaluation   Final    THIS SPECIMEN IS ACCEPTABLE. RESPIRATORY CULTURE REPORT TO FOLLOW.   Report Status 05/31/2016 FINAL  Final  Culture, respiratory (NON-Expectorated)     Status: None   Collection Time: 05/31/16 11:51 AM  Result Value Ref Range Status   Specimen Description SPUTUM  Final   Special Requests NONE  Final   Gram Stain   Final    RARE WBC PRESENT, PREDOMINANTLY PMN MODERATE GRAM POSITIVE COCCI IN PAIRS FEW GRAM NEGATIVE RODS Performed at [place], NONE PREDOMINANT  Final   Report Status 06/02/2016 FINAL  Final    Radiology: Dg Chest 2 View  Result Date: 05/28/2016 CLINICAL DATA:  SOB increasing x 2 days - currently being treated for lung cancer - hx COPD - hx asthma EXAM: CHEST  2 VIEW COMPARISON:  05/07/2016 FINDINGS: The cardiac silhouette is normal in size. No mediastinal masses or convincing adenopathy. Coarse reticular type opacity extends from superior left hilum. There is fullness from the inferior right hilum. These findings stable. Lungs are hyperexpanded but otherwise clear. No pleural effusion or pneumothorax. Right anterior chest wall Port-A-Cath is stable. Bony thorax is demineralized. Mild compression fracture of a mid to upper thoracic vertebra, also stable. No convincing osteoblastic or osteolytic lesions. IMPRESSION: No acute cardiopulmonary  disease. No significant change from the previous chest radiograph. Electronically Signed   By: Lajean Manes M.D.   On: 05/28/2016 14:49   Ct Angio Chest Pe W Or Wo Contrast  Result Date: 06/01/2016 CLINICAL DATA:  Hemoptysis EXAM: CT ANGIOGRAPHY CHEST WITH CONTRAST TECHNIQUE: Multidetector CT imaging of the chest was performed using the standard protocol during bolus administration of intravenous contrast. Multiplanar CT image reconstructions and MIPs were  obtained to evaluate the vascular anatomy. CONTRAST:  100 cc Isovue 370 COMPARISON:  02/12/2016 FINDINGS: There are no filling defects in the pulmonary arterial tree to suggest acute pulmonary thromboembolism. Right peritracheal mass in the upper mediastinum measures 3.2 x 3.1 cm and previously measured 6.4 x 4.5 cm. Is improved. Ill-defined calcifications persist within the mass. Post radiation changes in the medial left lung are stable. Soft tissue prominence in the medial right middle lobe have improved. A focal ground-glass opacity in the lateral basal right lower lobe on image 63 measures 10 mm. Small pleural effusions have developed. Bilateral dependent atelectasis. Partial solid nodule in the left lower lobe measures 5 mm on image 49. There is fluid or mucus material in left lower lobe airways extending to the posterior basal segment. See image 49. Severe emphysema persists. Interlobular septa are more prominent suggesting interstitial edema or pneumonia. Biapical scarring is stable. No pneumothorax. Right jugular Port-A-Cath is stable. Right ventricle is dilated. Diffuse hepatic steatosis. Calcified granuloma in the right lobe of the liver. Tiny hypodensities in the lateral spleen are nonspecific. Mid-level thoracic compression deformities are stable. This includes T4, T5, and probably T6. Review of the MIP images confirms the above findings. IMPRESSION: No evidence of acute pulmonary thromboembolism New small bilateral pleural effusions with  dependent atelectasis New sub cm part solid left lower lobe nodule. New 10 mm ground-glass opacity in the right lower lobe. Central right middle lobe mass and large right paratracheal mass are improved. New interstitial changes suggesting interstitial edema or pneumonia. Electronically Signed   By: Marybelle Killings M.D.   On: 06/01/2016 09:49   Dg Chest Port 1 View  Result Date: 05/31/2016 CLINICAL DATA:  Lung cancer, dyspnea EXAM: PORTABLE CHEST 1 VIEW COMPARISON:  05/28/2016 FINDINGS: Cardiomediastinal silhouette is stable. Hyperinflation again noted. Again noted right paratracheal adenopathy. Stable left perihilar postradiation changes. No definite superimposed infiltrate or pulmonary edema. Mild perihilar bronchitic changes. Right IJ Port-A-Cath is unchanged in position. IMPRESSION: Hyperinflation again noted. Again noted right paratracheal adenopathy. Stable left perihilar postradiation changes. No definite superimposed infiltrate or pulmonary edema. Mild perihilar bronchitic changes. Electronically Signed   By: Lahoma Crocker M.D.   On: 05/31/2016 10:12    Medications:   . budesonide  0.25 mg Nebulization BID  . ceFEPime (MAXIPIME) IV  1 g Intravenous Q12H  . diltiazem  240 mg Oral QPM  . enoxaparin (LOVENOX) injection  40 mg Subcutaneous Q24H  . famotidine  20 mg Oral BID  . guaiFENesin  1,200 mg Oral BID  . lactose free nutrition  237 mL Oral BID BM  . levalbuterol  0.63 mg Nebulization TID  . magnesium oxide  400 mg Oral BID  . methylPREDNISolone (SOLU-MEDROL) injection  60 mg Intravenous Q6H  . morphine  15 mg Oral Q12H  . polyethylene glycol  17 g Oral Daily  . potassium chloride  20 mEq Oral Daily  . sodium chloride flush  3 mL Intravenous Q12H  . vancomycin  750 mg Intravenous Q12H   Continuous Infusions: . sodium chloride 20 mL/hr at 06/03/16 2036    Time spent: 35 minutes.  The patient is medically complex with multiple co-morbidities and is at high risk for clinical deterioration  and requires high complexity decision making.    LOS: 7 days   [facility] Pager [phone]. If unable to reach me by pager, please call my cell phone at [phone].  *Please refer to [email or web address], password [email or web address] to get updated schedule on who  will round on this patient, as hospitalists switch teams weekly. If 7PM-7AM, please contact night-coverage at [email or web address], password [email or web address] for any overnight needs.  06/04/2016, 9:00 AM

## 2016-06-04 NOTE — Consult Note (Signed)
[facility]  Total days of antibiotics 6        Day 5 cefepime        Day 5 vancomycin               Reason for Consult: leukocytosis in immunocompromised host    Referring Physician: rama  Principal Problem:   Chemotherapy-induced enteritis Active Problems:   COPD, moderate ([place])   Primary cancer of right upper lobe of lung (HCC)   Dehydration   Nausea & vomiting   Hypokalemia   Chronic respiratory failure (HCC)   Antineoplastic chemotherapy induced anemia   Protein-calorie malnutrition, severe   Sepsis ([place])   Immunocompromised state ([place])    HPI: Kyrsten Deleeuw is a 75 y.o. female with history of metastatic NSCLC, SCC dx in 12/2011, COPD, sinus tachycardia. She is currently on palliative chemo with docetaxel, ramucirumab s/p 6 cycles.  Last cycle on August 3rd with GCSF administered on 8/5. She developed nausea/vomiting associated with chemo and presented to the ED on 8/7 went home then readmitted for worsening symptoms on 8/8. she states that this has happened before with her prior chemo and gcsf injection. Wbc on 8/7 was 28.7K, on admit on 8/8 down to 12K. She was initially treated for chemo related gastroenteritis. On 8/11 her WBC continued to trend up from 11K -> 35-> 46.9K. Concern for SIRS, she was started on empiric vancomycin and cefepime. Her infectious work up did not reveal any urinary source, blood cx were negative. She reports having a chronic productive cough associated with her COPD, ongoing sinus tachycardia which led to ruling out PE via CTPA which was negative. Chest CT was +/- for possible new infiltrate. Currently on day 5 of HCAP, her WBC still continues to be markedly elevated at 58K. Sputum is now clear. She had similar admission in April when she had GCSF for hte first time she reports, where she had marked leukocytosis. She denies having dysuria, n/v/d, fever, chills, nightsweats. Baseline cough+. She occasionally has left sided chest wall  pain associated with prior radiation- on chronic opiates. Looking forward to going home to care for her husband.  Past Medical History:  Diagnosis Date  . Asthma   . COPD (chronic obstructive pulmonary disease) ([place])   . DNR (do not resuscitate) 06/26/2015  . Dysphagia 12/07/2015  . Encounter for antineoplastic immunotherapy 05/22/2015  . Hiatal hernia   . History of chemotherapy   . History of radiation therapy 03/06/2012   left hilum  . History of radiation therapy 05/10/2013-05/31/2013   Left lung/ 33/75@2 .25 per fraction x 15 fractions  . Hyperlipidemia   . Neutropenia, drug-induced ([place]) 05/05/2012  . Non-small cell lung cancer ([place]) dx'd 08/28/11   left lung  . Primary cancer of right upper lobe of lung ([place]) 02/06/2012  . Radiation 11/15/13-11/26/13   Right hilum 30 Gy in 10 fractions  . Rheumatoid arthritis(714.0)     Allergies:  Allergies  Allergen Reactions  . Shellfish Allergy Swelling     MEDICATIONS: . budesonide  0.25 mg Nebulization BID  . ceFEPime (MAXIPIME) IV  1 g Intravenous Q12H  . diltiazem  240 mg Oral QPM  . enoxaparin (LOVENOX) injection  40 mg Subcutaneous Q24H  . famotidine  20 mg Oral BID  . guaiFENesin  1,200 mg Oral BID  . lactose free nutrition  237 mL Oral BID BM  . levalbuterol  0.63 mg Nebulization TID  . magnesium oxide  400 mg Oral BID  .  methylPREDNISolone (SOLU-MEDROL) injection  60 mg Intravenous Q6H  . morphine  15 mg Oral Q12H  . polyethylene glycol  17 g Oral Daily  . potassium chloride  20 mEq Oral Daily  . sodium chloride flush  3 mL Intravenous Q12H  . vancomycin  750 mg Intravenous Q12H    Social History  Substance Use Topics  . Smoking status: Former Smoker    Packs/day: 0.50    Years: 40.00    Types: Cigarettes    Quit date: 10/29/2009  . Smokeless tobacco: Never Used  . Alcohol use No    Family History  Problem Relation Age of Onset  . Diabetes Mother     insulin dependent  . Breast cancer Mother     Review of Systems   Constitutional: Negative for fever, chills, diaphoresis, activity change, appetite change, fatigue and unexpected weight change.  HENT: Negative for congestion, sore throat, rhinorrhea, sneezing, trouble swallowing and sinus pressure.  Eyes: Negative for photophobia and visual disturbance.  Respiratory: Negative for cough, chest tightness, shortness of breath, wheezing and stridor.  Cardiovascular: Negative for chest pain, palpitations and leg swelling.  Gastrointestinal: Negative for nausea, vomiting, abdominal pain, diarrhea, constipation, blood in stool, abdominal distention and anal bleeding.  Genitourinary: Negative for dysuria, hematuria, flank pain and difficulty urinating.  Musculoskeletal: Negative for myalgias, back pain, joint swelling, arthralgias and gait problem.  Skin: Negative for color change, pallor, rash and wound.  Neurological: Negative for dizziness, tremors, weakness and light-headedness.  Hematological: Negative for adenopathy. Does not bruise/bleed easily.  Psychiatric/Behavioral: Negative for behavioral problems, confusion, sleep disturbance, dysphoric mood, decreased concentration and agitation.     OBJECTIVE: Temp:  [98 F (36.7 C)-98.6 F (37 C)] 98.3 F (36.8 C) (08/15 1415) Pulse Rate:  [114-118] 118 (08/15 1415) Resp:  [20-22] 20 (08/15 1415) BP: (125-145)/(65-93) 132/72 (08/15 1415) SpO2:  [97 %-100 %] 97 % (08/15 1449) Physical Exam  Constitutional:  oriented to person, place, and time. appears well-developed and well-nourished. No distress.  HENT: [place]/AT, PERRLA, no scleral icterus Mouth/Throat: Oropharynx is clear and moist. No oropharyngeal exudate.  Cardiovascular: Normal rate, regular rhythm and normal heart sounds. Exam reveals no gallop and no friction rub.  No murmur heard.  Pulmonary/Chest: Effort normal and breath sounds normal. No respiratory distress.  has no wheezes.  Chest wall = right sided port non tender, no drainage Neck = supple,  no nuchal rigidity Abdominal: Soft. Bowel sounds are normal.  exhibits no distension. There is no tenderness.  Lymphadenopathy: no cervical adenopathy. No axillary adenopathy Neurological: alert and oriented to person, place, and time.  Skin: Skin is warm and dry. No rash noted. No erythema.  Psychiatric: a normal mood and affect.  behavior is normal.   LABS: Results for orders placed or performed during the hospital encounter of 05/28/16 (from the past 48 hour(s))  CBC     Status: Abnormal   Collection Time: 06/03/16  5:00 AM  Result Value Ref Range   WBC 37.5 (H) 4.0 - 10.5 K/uL   RBC 3.40 (L) 3.87 - 5.11 MIL/uL   Hemoglobin 8.9 (L) 12.0 - 15.0 g/dL   HCT 27.1 (L) 36.0 - 46.0 %   MCV 79.7 78.0 - 100.0 fL   MCH 26.2 26.0 - 34.0 pg   MCHC 32.8 30.0 - 36.0 g/dL   RDW 17.7 (H) 11.5 - 15.5 %   Platelets 374 150 - 400 K/uL  Basic metabolic panel     Status: Abnormal   Collection Time:  06/03/16  5:00 AM  Result Value Ref Range   Sodium 137 135 - 145 mmol/L   Potassium 4.7 3.5 - 5.1 mmol/L    Comment: RESULT REPEATED AND VERIFIED DELTA CHECK NOTED NO VISIBLE HEMOLYSIS    Chloride 98 (L) 101 - 111 mmol/L   CO2 32 22 - 32 mmol/L   Glucose, Bld 138 (H) 65 - 99 mg/dL   BUN 6 6 - 20 mg/dL   Creatinine, Ser 0.43 (L) 0.44 - 1.00 mg/dL   Calcium 8.6 (L) 8.9 - 10.3 mg/dL   GFR calc non Af Amer >60 >60 mL/min   GFR calc Af Amer >60 >60 mL/min    Comment: (NOTE) The eGFR has been calculated using the CKD EPI equation. This calculation has not been validated in all clinical situations. eGFR's persistently <60 mL/min signify possible Chronic Kidney Disease.    Anion gap 7 5 - 15  Magnesium     Status: Abnormal   Collection Time: 06/03/16  5:00 AM  Result Value Ref Range   Magnesium 1.4 (L) 1.7 - 2.4 mg/dL  Vancomycin, trough     Status: Abnormal   Collection Time: 06/03/16 12:55 PM  Result Value Ref Range   Vancomycin Tr 6 (L) 15 - 20 ug/mL  Basic metabolic panel     Status:  Abnormal   Collection Time: 06/04/16  3:55 AM  Result Value Ref Range   Sodium 138 135 - 145 mmol/L   Potassium 4.3 3.5 - 5.1 mmol/L   Chloride 98 (L) 101 - 111 mmol/L   CO2 31 22 - 32 mmol/L   Glucose, Bld 194 (H) 65 - 99 mg/dL   BUN 14 6 - 20 mg/dL   Creatinine, Ser 0.48 0.44 - 1.00 mg/dL   Calcium 8.8 (L) 8.9 - 10.3 mg/dL   GFR calc non Af Amer >60 >60 mL/min   GFR calc Af Amer >60 >60 mL/min    Comment: (NOTE) The eGFR has been calculated using the CKD EPI equation. This calculation has not been validated in all clinical situations. eGFR's persistently <60 mL/min signify possible Chronic Kidney Disease.    Anion gap 9 5 - 15  CBC     Status: Abnormal   Collection Time: 06/04/16  3:55 AM  Result Value Ref Range   WBC 58.4 (HH) 4.0 - 10.5 K/uL    Comment: REPEATED TO VERIFY CRITICAL RESULT CALLED TO, READ BACK BY AND VERIFIED WITH: C.CORIDON,RN AT 0525 06/04/16 BY W.SHEA    RBC 3.23 (L) 3.87 - 5.11 MIL/uL   Hemoglobin 8.6 (L) 12.0 - 15.0 g/dL   HCT 26.0 (L) 36.0 - 46.0 %   MCV 80.5 78.0 - 100.0 fL   MCH 26.6 26.0 - 34.0 pg   MCHC 33.1 30.0 - 36.0 g/dL   RDW 17.9 (H) 11.5 - 15.5 %   Platelets 367 150 - 400 K/uL  Differential     Status: Abnormal   Collection Time: 06/04/16  3:55 AM  Result Value Ref Range   Neutrophils Relative % 95 %   Lymphocytes Relative 2 %   Monocytes Relative 3 %   Eosinophils Relative 0 %   Basophils Relative 0 %   Neutro Abs 55.4 (H) 1.7 - 7.7 K/uL   Lymphs Abs 1.2 0.7 - 4.0 K/uL   Monocytes Absolute 1.8 (H) 0.1 - 1.0 K/uL   Eosinophils Absolute 0.0 0.0 - 0.7 K/uL   Basophils Absolute 0.0 0.0 - 0.1 K/uL   RBC Morphology RARE NRBCs  WBC Morphology MILD LEFT SHIFT (1-5% METAS, OCC MYELO, OCC BANDS)     Comment: WHITE COUNT CONFIRMED ON SMEAR VACUOLATED NEUTROPHILS   Procalcitonin - Baseline     Status: None   Collection Time: 06/04/16 10:15 AM  Result Value Ref Range   Procalcitonin 0.44 ng/mL    Comment:        Interpretation: PCT  (Procalcitonin) <= 0.5 ng/mL: Systemic infection (sepsis) is not likely. Local bacterial infection is possible. (NOTE)         ICU PCT Algorithm               Non ICU PCT Algorithm    ----------------------------     ------------------------------         PCT < 0.25 ng/mL                 PCT < 0.1 ng/mL     Stopping of antibiotics            Stopping of antibiotics       strongly encouraged.               strongly encouraged.    ----------------------------     ------------------------------       PCT level decrease by               PCT < 0.25 ng/mL       >= 80% from peak PCT       OR PCT 0.25 - 0.5 ng/mL          Stopping of antibiotics                                             encouraged.     Stopping of antibiotics           encouraged.    ----------------------------     ------------------------------       PCT level decrease by              PCT >= 0.25 ng/mL       < 80% from peak PCT        AND PCT >= 0.5 ng/mL            Continuin g antibiotics                                              encouraged.       Continuing antibiotics            encouraged.    ----------------------------     ------------------------------     PCT level increase compared          PCT > 0.5 ng/mL         with peak PCT AND          PCT >= 0.5 ng/mL             Escalation of antibiotics                                          strongly encouraged.      Escalation of antibiotics        strongly encouraged.  Lactic acid, plasma     Status: Abnormal   Collection Time: 06/04/16 10:15 AM  Result Value Ref Range   Lactic Acid, Venous 3.0 (HH) 0.5 - 1.9 mmol/L    Comment: CRITICAL RESULT CALLED TO, READ BACK BY AND VERIFIED WITH: PELLETIER,E @ 1108 ON [id number] BY POTEAT,S   Urinalysis, Routine w reflex microscopic (not at [facility])     Status: Abnormal   Collection Time: 06/04/16 12:49 PM  Result Value Ref Range   Color, Urine YELLOW YELLOW   APPearance CLEAR CLEAR   Specific Gravity, Urine 1.010 1.005  - 1.030   pH 7.0 5.0 - 8.0   Glucose, UA 100 (A) NEGATIVE mg/dL   Hgb urine dipstick NEGATIVE NEGATIVE   Bilirubin Urine NEGATIVE NEGATIVE   Ketones, ur NEGATIVE NEGATIVE mg/dL   Protein, ur NEGATIVE NEGATIVE mg/dL   Nitrite NEGATIVE NEGATIVE   Leukocytes, UA NEGATIVE NEGATIVE    Comment: MICROSCOPIC NOT DONE ON URINES WITH NEGATIVE PROTEIN, BLOOD, LEUKOCYTES, NITRITE, OR GLUCOSE <1000 mg/dL.  Lactic acid, plasma     Status: Abnormal   Collection Time: 06/04/16  1:35 PM  Result Value Ref Range   Lactic Acid, Venous 2.4 (HH) 0.5 - 1.9 mmol/L    Comment: CRITICAL RESULT CALLED TO, READ BACK BY AND VERIFIED WITH: E.PELLETIER RN AT 1420 ON 06/04/16 BY S.VANHOORNE     MICRO: 8/11 blood cx ngtd 8/15 urine cx, ua is clear, ur cx pending 8/11 sputum cx normal flora IMAGING: Chest ct from 8/12:per my review, not significantly changed from 6/27, has some small bilateral pleural effusion. And possibly rLL changes/ along with ongoing central RML and right paratracheal mass - appears improved  No evidence of acute pulmonary thromboembolism  New small bilateral pleural effusions with dependent atelectasis  New sub cm part solid left lower lobe nodule. New 10 mm ground-glass opacity in the right lower lobe.  Central right middle lobe mass and large right paratracheal mass are improved.  New interstitial changes suggesting interstitial edema or pneumonia.  Assessment/Plan:  75yo F with NSCLC/SCC of lung on salvage chemotherapy initially admitted for N/V associated with chemo but then found to have tachycardia and marked leukocytosis concerning for underlying infection vs. leukamoid reaction for GCSF.  Since she had infectious work up that was negative, I suspect that this may all be due to GCSF which she had similar admission in April.  Recommend to finish out 7 day course of treatment for hcap since appears she is having improvement with sputum/cough.currently on day 6  Sinus  tachycardia = appears to be stable but has had elevated HR even when on aug 3rd at time of chemo. May need additional agent to control HR vs increased dose of diltiazem  Leukocytosis = thought to be secondary to GCSF. Would recommend repeat CB tomorrow

## 2016-06-04 NOTE — Progress Notes (Signed)
CRITICAL VALUE ALERT  Critical value received:  Lactic Acid 3.0   Date of notification:  06/04/16  Time of notification:  1110  Critical value read back:Yes.    Nurse who received alert:  Star Age  MD notified (1st page):  Dr Rockne Menghini  Time of first page:  1126  MD notified (2nd page):  Time of second page:  Responding MD:  Dr Rockne Menghini  Time MD responded:  [phone]

## 2016-06-04 NOTE — Care Management Important Message (Signed)
Important Message  Patient Details  Name: Kirat Mezquita MRN: [id number] Date of Birth: Feb 23, 1941   Medicare Important Message Given:  Yes    Camillo Flaming 06/04/2016, 10:47 AMImportant Message  Patient Details  Name: Japneet Staggs MRN: [id number] Date of Birth: 01-01-1941   Medicare Important Message Given:  Yes    Camillo Flaming 06/04/2016, 10:46 AM

## 2016-06-05 DIAGNOSIS — K521 Toxic gastroenteritis and colitis: Secondary | ICD-10-CM

## 2016-06-05 DIAGNOSIS — T451X5A Adverse effect of antineoplastic and immunosuppressive drugs, initial encounter: Secondary | ICD-10-CM

## 2016-06-05 LAB — BASIC METABOLIC PANEL WITH GFR
Anion gap: 6 (ref 5–15)
BUN: 11 mg/dL (ref 6–20)
CO2: 32 mmol/L (ref 22–32)
Calcium: 8.4 mg/dL — ABNORMAL LOW (ref 8.9–10.3)
Chloride: 100 mmol/L — ABNORMAL LOW (ref 101–111)
Creatinine, Ser: 0.5 mg/dL (ref 0.44–1.00)
GFR calc Af Amer: >60 mL/min
GFR calc non Af Amer: >60 mL/min
Glucose, Bld: 145 mg/dL — ABNORMAL HIGH (ref 65–99)
Potassium: 4.1 mmol/L (ref 3.5–5.1)
Sodium: 138 mmol/L (ref 135–145)

## 2016-06-05 LAB — CULTURE, BLOOD (ROUTINE X 2)
CULTURE: NO GROWTH
Culture: NO GROWTH

## 2016-06-05 LAB — CBC
HCT: 24 % — ABNORMAL LOW (ref 36.0–46.0)
Hemoglobin: 7.9 g/dL — ABNORMAL LOW (ref 12.0–15.0)
MCH: 26.1 pg (ref 26.0–34.0)
MCHC: 32.9 g/dL (ref 30.0–36.0)
MCV: 79.2 fL (ref 78.0–100.0)
Platelets: 295 K/uL (ref 150–400)
RBC: 3.03 MIL/uL — ABNORMAL LOW (ref 3.87–5.11)
RDW: 17.6 % — ABNORMAL HIGH (ref 11.5–15.5)
WBC: 42.8 K/uL — ABNORMAL HIGH (ref 4.0–10.5)

## 2016-06-05 LAB — URINE CULTURE: CULTURE: NO GROWTH

## 2016-06-05 MED ORDER — PREDNISONE 20 MG PO TABS
60.0000 mg | ORAL_TABLET | Freq: Every day | ORAL | Status: DC
  Administered 2016-06-06: 60 mg via ORAL
  Filled 2016-06-05: qty 3

## 2016-06-05 MED ORDER — METHYLPREDNISOLONE SODIUM SUCC 125 MG IJ SOLR
60.0000 mg | Freq: Two times a day (BID) | INTRAMUSCULAR | Status: AC
  Administered 2016-06-05: 60 mg via INTRAVENOUS
  Filled 2016-06-05: qty 2

## 2016-06-05 NOTE — Progress Notes (Signed)
Physical Therapy Treatment Patient Details Name: Destiny Harrison MRN: [id number] DOB: March 31, 1941 Today's Date: 06-15-2016    History of Present Illness 75 y.o. female with a past medical history metastatic non-small cell lung cancer diagnosed in March 2013, COPD, RA and admitted for nausea/vomiting and Chemotherapy-induced enteritis    PT Comments    Assisted pt OOB to [facility] then amb a limited distance in hallway.  General Gait Details: limited amb distance due to increased HR 148 and 2/4 DOE.  Several standing rest breaks.  Follow Up Recommendations  No PT follow up     Equipment Recommendations  None recommended by PT    Recommendations for Other Services       Precautions / Restrictions Precautions Precaution Comments: monitor sats Restrictions Weight Bearing Restrictions: No    Mobility  Bed Mobility Overal bed mobility: Modified Independent             General bed mobility comments: increased time and caution to IV line  Transfers Overall transfer level: Needs assistance Equipment used: None Transfers: Sit to/from Stand;Stand Pivot Transfers Sit to Stand: Min guard;Supervision         General transfer comment: set up MinGuard Assist from bed to [facility] with one VC safety with hand placement with stand to sit  Ambulation/Gait Ambulation/Gait assistance: Min assist;Min guard Ambulation Distance (Feet): 72 Feet Assistive device: None (holding to IV pole) Gait Pattern/deviations: Step-to pattern;Step-through pattern;Trunk flexed;Shuffle Gait velocity: decreased   General Gait Details: limited amb distance due to increased HR 148 and 2/4 DOE.  Several standing rest breaks.   Stairs            Wheelchair Mobility    Modified Rankin (Stroke Patients Only)       Balance                                    Cognition Arousal/Alertness: Awake/alert Behavior During Therapy: WFL for tasks assessed/performed Overall Cognitive Status: Within  Functional Limits for tasks assessed                      Exercises      General Comments        Pertinent Vitals/Pain Pain Assessment: No/denies pain    Home Living                      Prior Function            PT Goals (current goals can now be found in the care plan section) Progress towards PT goals: Progressing toward goals    Frequency  Min 3X/week    PT Plan Current plan remains appropriate    Co-evaluation             End of Session Equipment Utilized During Treatment: Gait belt Activity Tolerance: Treatment limited secondary to medical complications (Comment);Patient limited by fatigue (tachycardia) Patient left: in bed;with bed alarm set;with family/visitor present;with call bell/phone within reach     Time: 1431-1442 PT Time Calculation (min) (ACUTE ONLY): 11 min  Charges:  $Gait Training: 8-22 mins                    G Codes:      Nathanial Rancher 06/15/2016, 3:52 PM

## 2016-06-05 NOTE — Progress Notes (Signed)
SATURATION QUALIFICATIONS: (This note is used to comply with regulatory documentation for home oxygen)  Patient Saturations on Room Air at Rest = 78%  Patient Saturations on Room Air while Ambulating = 71%  Patient Saturations on 2 Liters of oxygen while Ambulating = 85% (at rest pt O2 is 99 on 2L)  Please briefly explain why patient needs home oxygen: Pt de-sats while not on O2.  Argelio Granier A. Yacine Droz BSN, RN 06/05/2016 10:41 AM

## 2016-06-05 NOTE — Progress Notes (Signed)
[facility] Progress Note  Patient: Destiny Harrison DPO:[id number]   PCP: Thressa Sheller, MD DOB: 1941-02-13   DOA: 05/28/2016   DOS: 06/05/2016   Date of Service: the patient was seen and examined on 06/05/2016  Subjective: Patient is feeling better shortness of breath is also getting better. Continues to have cough with brownish expectoration without any blood. No chest pain. Nutrition: Tolerating oral diet  Brief hospital course: Destiny Harrison is an 75 y.o. female with a past medical history metastatic non-small cell lung cancer diagnosed in March 2013 status post palliative radiation therapy to left lung mass, systemic therapy with carboplatin and Abraxane, also receiving immunotherapy with Nivolumab in 2016, currently receiving chemotherapy with Docetaxel and Cyramza every 3 weeks with Neulasta support, under the care of Dr. Julien Nordmann, Who was admitted 05/28/16 with a chief complaint of nausea/vomiting. Her last dose of chemotherapy was 05/23/16. She was evaluated in the ED on 05/27/16 where she received IV fluids and anti-emetics and was discharged home.  Assessment and Plan: Principal Problems:   Chemotherapy-induced enteritis/Nausea and vomiting/dehydration Continue supportive management with IV fluids and anti-emetics. No evidence of obstruction. Continue Pepcid twice a day. Tolerating a soft diet.   Sepsis secondary to HCAP in an immunocompromised host The patient had a marked increase in her WBC count on 05/31/16, which was associated with tachycardia and tachypnea. She was not hypotensive or febrile. Lactic acid was 1.6. Chest x-ray was negative for obvious pneumonia. Urine cultures done on admission were negative. The patient was subsequently placed on empiric vancomycin and cefepime given her immunocompromised status. Repeat blood cultures and sputum culture were done, and are thus far unrevealing. CT angiogram done 06/01/16, negative for pulmonary embolism but did show interstitial infiltrates  consistent with pneumonia. She did receive Neulasta 10 days ago, so this could be contributory. We will finish 7 days of IV antibiotics  Active Problems:   Right upper extremity swelling Upper extremity venous duplex negative.    Severe protein calorie malnutrition Evaluated by dietitian 05/29/16. Supplements ordered.    Anti-neoplastic chemotherapy-induced anemia Monitor hemoglobin. Currently 8.6 mg/dL. No indication for transfusion, but could consider if she becomes symptomatic or hemoglobin drops less than 7.    Hypertension  Continue Cardizem.    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 Continue supplemental oxygen. Continue Solu-Medrol, wean as tolerated..    Primary cancer of right upper lobe of lung (HCC)/non-small cell lung cancer She currently receives her care at the [facility], follows Dr. Julien Nordmann. Currently receiving chemotherapy with Docetaxel and Cyramza every 3 weeks with Neulasta support, last infusion on 05/23/2016. Her oncologist was notified of her admission.    Hypokalemia/hypomagnesemia Monitor and replace electrolytes as needed.   Pain management: When necessary morphine, continue MS Contin every 12 hours as well as Norco when necessary Activity: Consulted physical therapy Bowel regimen: last BM 06/05/2016 Diet: Cardiac diet DVT Prophylaxis: subcutaneous Heparin  Advance goals of care discussion: DNR/DNI  Family Communication: no family was present at bedside, at the time of interview.  Disposition:  Discharge to home. Expected discharge date: 06/06/2016  Consultants: Infectious disease Procedures: None  Antibiotics: Anti-infectives    Start     Dose/Rate Route Frequency Ordered Stop   06/04/16 0000  vancomycin (VANCOCIN) IVPB 1000 mg/200 mL premix  Status:  Discontinued     1,000 mg 200 mL/hr over 60 Minutes Intravenous Every 12 hours 06/03/16 1353 06/03/16  1357   06/04/16 0000  vancomycin (VANCOCIN) IVPB 750  mg/150 ml premix     750 mg 150 mL/hr over 60 Minutes Intravenous Every 12 hours 06/03/16 1357     06/03/16 2200  ceFEPIme (MAXIPIME) 1 g in dextrose 5 % 50 mL IVPB     1 g 100 mL/hr over 30 Minutes Intravenous Every 12 hours 06/03/16 1353     05/31/16 1130  vancomycin (VANCOCIN) IVPB 1000 mg/200 mL premix  Status:  Discontinued     1,000 mg 200 mL/hr over 60 Minutes Intravenous Every 24 hours 05/31/16 1102 06/03/16 1353   05/31/16 1130  ceFEPIme (MAXIPIME) 2 g in dextrose 5 % 50 mL IVPB  Status:  Discontinued     2 g 100 mL/hr over 30 Minutes Intravenous Every 24 hours 05/31/16 1102 06/03/16 1353   05/28/16 1900  cefTRIAXone (ROCEPHIN) 1 g in dextrose 5 % 50 mL IVPB  Status:  Discontinued     1 g 100 mL/hr over 30 Minutes Intravenous Every 24 hours 05/28/16 1746 05/29/16 1027        Intake/Output Summary (Last 24 hours) at 06/05/16 1822 Last data filed at 06/05/16 1613  Gross per 24 hour  Intake             1500 ml  Output             2625 ml  Net            -1125 ml   Filed Weights   05/28/16 1500 05/28/16 1746  Weight: 52.2 kg (115 lb) 48.2 kg (106 lb 4.8 oz)    Objective: Physical Exam: Vitals:   06/05/16 0456 06/05/16 0731 06/05/16 0738 06/05/16 1415  BP: (!) 159/75   140/67  Pulse: 91   (!) 102  Resp: 20   18  Temp: 97.9 F (36.6 C)   98 F (36.7 C)  TempSrc: Oral   Oral  SpO2: 100% 99% 99% 96%  Weight:      Height:        General: Alert, Awake and Oriented to Time, Place and Person. Appear in mild distress Eyes: PERRL, Conjunctiva normal ENT: Oral Mucosa clear moist. Neck: no JVD, no Abnormal Mass Or lumps Cardiovascular: S1 and S2 Present, no Murmur, Respiratory: Bilateral Air entry equal and Decreased, basal Crackles, no wheezes Abdomen: Bowel Sound present, Soft and no tenderness Skin: no redness, no Rash  Extremities: no Pedal edema, no calf tenderness Neurologic: Grossly no focal neuro deficit.  Bilaterally Equal motor strength  Data Reviewed: CBC:  Recent Labs Lab 06/01/16 0400 06/02/16 0830 06/03/16 0500 06/04/16 0355 06/05/16 0412  WBC 46.9* 40.9* 37.5* 58.4* 42.8*  NEUTROABS  --   --   --  55.4*  --   HGB 8.4* 8.0* 8.9* 8.6* 7.9*  HCT 25.7* 24.6* 27.1* 26.0* 24.0*  MCV 80.6 80.4 79.7 80.5 79.2  PLT 394 315 374 367 833   Basic Metabolic Panel:  Recent Labs Lab 06/01/16 0400 06/02/16 0830 06/03/16 0500 06/04/16 0355 06/05/16 0412  NA 137 136 137 138 138  K 3.3* 3.4* 4.7 4.3 4.1  CL 99* 97* 98* 98* 100*  CO2 32 32 32 31 32  GLUCOSE 94 95 138* 194* 145*  BUN <5* 5* '6 14 11  '$ CREATININE 0.50 0.58 0.43* 0.48 0.50  CALCIUM 8.2* 8.3* 8.6* 8.8* 8.4*  MG  --   --  1.4*  --   --     Liver Function Tests: No results for input(s): AST, ALT, ALKPHOS, BILITOT, PROT, ALBUMIN in the last 168  hours. No results for input(s): LIPASE, AMYLASE in the last 168 hours. No results for input(s): AMMONIA in the last 168 hours. Coagulation Profile: No results for input(s): INR, PROTIME in the last 168 hours. Cardiac Enzymes: No results for input(s): CKTOTAL, CKMB, CKMBINDEX, TROPONINI in the last 168 hours. BNP (last 3 results) No results for input(s): PROBNP in the last 8760 hours.  CBG: No results for input(s): GLUCAP in the last 168 hours.  Studies: No results found.   Scheduled Meds: . budesonide  0.25 mg Nebulization BID  . ceFEPime (MAXIPIME) IV  1 g Intravenous Q12H  . diltiazem  240 mg Oral QPM  . enoxaparin (LOVENOX) injection  40 mg Subcutaneous Q24H  . famotidine  20 mg Oral BID  . guaiFENesin  1,200 mg Oral BID  . lactose free nutrition  237 mL Oral BID BM  . levalbuterol  0.63 mg Nebulization TID  . magnesium oxide  400 mg Oral BID  . morphine  15 mg Oral Q12H  . polyethylene glycol  17 g Oral Daily  . potassium chloride  20 mEq Oral Daily  . [START ON 06/06/2016] predniSONE  60 mg Oral Q breakfast  . sodium chloride flush  3 mL Intravenous Q12H  .  vancomycin  750 mg Intravenous Q12H   Continuous Infusions:  PRN Meds: diltiazem, HYDROcodone-acetaminophen, levalbuterol, morphine injection, ondansetron (ZOFRAN) IV, sodium chloride flush  Time spent: 30 minutes  Author: Berle Mull, MD [facility] Pager: [phone] 06/05/2016 6:22 PM  If 7PM-7AM, please contact night-coverage at [email or web address], password [facility]

## 2016-06-06 LAB — PROCALCITONIN: PROCALCITONIN: 0.12 ng/mL

## 2016-06-06 MED ORDER — FAMOTIDINE 20 MG PO TABS: 20.0000 mg | ORAL_TABLET | Freq: Two times a day (BID) | ORAL | 0 refills | Status: AC

## 2016-06-06 MED ORDER — HEPARIN SOD (PORK) LOCK FLUSH 100 UNIT/ML IV SOLN
500.0000 [IU] | INTRAVENOUS | Status: AC | PRN
  Administered 2016-06-06: 500 [IU]

## 2016-06-06 MED ORDER — PREDNISONE 10 MG PO TABS: ORAL_TABLET | ORAL | 0 refills | Status: DC

## 2016-06-06 MED ORDER — BOOST PLUS PO LIQD: 237.0000 mL | Freq: Two times a day (BID) | ORAL | 0 refills | Status: AC

## 2016-06-06 MED ORDER — DILTIAZEM HCL ER 240 MG PO CP24
240.0000 mg | ORAL_CAPSULE | Freq: Every evening | ORAL | 0 refills | Status: DC
Start: 2016-06-06 — End: 2016-07-24

## 2016-06-06 NOTE — Progress Notes (Signed)
    [facility] for Infectious [facility]    Date of Admission:  05/28/2016   Total days of antibiotics 8        Day 7 vanco/cefepime           ID: Destiny Harrison is a 75 y.o. female with  history of metastatic NSCLC, SCC dx in 12/2011, COPD, sinus tachycardia. She is currently on palliative chemo with docetaxel, ramucirumab s/p 6 cycles. Admitted for gastroenteritis, then had marked leukocytosis plus cough concerning for hcap Principal Problem:   Chemotherapy-induced enteritis Active Problems:   COPD, moderate (HCC)   Primary cancer of right upper lobe of lung (HCC)   Dehydration   Nausea & vomiting   Hypokalemia   Chronic respiratory failure (HCC)   Antineoplastic chemotherapy induced anemia   Protein-calorie malnutrition, severe   Sepsis ([place])   Immunocompromised state ([place])    Subjective: afebrile  Medications:  . budesonide  0.25 mg Nebulization BID  . diltiazem  240 mg Oral QPM  . enoxaparin (LOVENOX) injection  40 mg Subcutaneous Q24H  . famotidine  20 mg Oral BID  . guaiFENesin  1,200 mg Oral BID  . lactose free nutrition  237 mL Oral BID BM  . levalbuterol  0.63 mg Nebulization TID  . magnesium oxide  400 mg Oral BID  . morphine  15 mg Oral Q12H  . polyethylene glycol  17 g Oral Daily  . potassium chloride  20 mEq Oral Daily  . predniSONE  60 mg Oral Q breakfast  . sodium chloride flush  3 mL Intravenous Q12H    Objective: Vital signs in last 24 hours: Temp:  [98 F (36.7 C)-98.4 F (36.9 C)] 98 F (36.7 C) (08/17 0521) Pulse Rate:  [88-118] 88 (08/17 0521) Resp:  [18] 18 (08/17 0521) BP: (150-155)/(76-83) 155/80 (08/17 0521) SpO2:  [98 %-100 %] 98 % (08/17 1407)  gen = a xo by 3 in nad  Lab Results  Recent Labs  06/04/16 0355 06/05/16 0412  WBC 58.4* 42.8*  HGB 8.6* 7.9*  HCT 26.0* 24.0*  NA 138 138  K 4.3 4.1  CL 98* 100*  CO2 31 32  BUN 14 11  CREATININE 0.48 0.50    Microbiology:  Studies/Results: No results  found.   Assessment/Plan: Leukocytosis = improved, thought to be leukamoid reaction from GCSF. Recommend to repeat cbc again to ensure WBC trends down  HCAP = finish last day of abtx today  Sinus tachycardia = improved  Will sign off    Baxter Flattery [facility] for Infectious Diseases Cell: [phone] Pager: [phone]  06/06/2016, 3:09 PM

## 2016-06-06 NOTE — Progress Notes (Signed)
Nutrition Follow-up  DOCUMENTATION CODES:   Severe malnutrition in context of acute illness/injury, Underweight  INTERVENTION:  - Continue Boost Plus BID.  NUTRITION DIAGNOSIS:   Inadequate protein intake related to other (see comment) (current diet order ) as evidenced by other (see comment) (CLD does not meet estimated protein needs.). -improving.  GOAL:   Patient will meet greater than or equal to 90% of their needs -minimally met on average.  MONITOR:   PO intake, Supplement acceptance, Weight trends, Labs, I & O's  ASSESSMENT:   74 y.o. female with a past medical history metastatic non-small cell lung cancer diagnosed in March 2013 status post palliative radiation therapy to left lung mass, systemic therapy with carboplatin and Abraxane, also receiving immunotherapy with Nivolumab in 2016, currently receiving chemotherapy with Docetaxel and Cyramza every 3 weeks with Neulasta support, under the care of Dr. Mohamed. She was last seen by Dr. Mohamed on 05/23/2016. She received her last infusion with chemotherapy on 05/23/2016 at the cancer center then got a dose of Neulasta last Saturday. Then on Sunday she developed intractible nausea and vomiting presenting to the ED on 05/27/2016 where she received IV fluids, IV antiemetic therapy and discharged home.  She reports in the last 24 hours unable to keep even her medications down which has led to uncontrolled pain symptoms (unable to take Norco and MS Contin).   8/17 No documented intakes since previous RD visit. No new weight since admission. Pt reports that N/V with intakes continues to improve and that d/t this appetite has also been improving. Pt states she is to d/c this afternoon or evening. She denies nutrition-related questions or concerns at this time.   Medications reviewed; 400 mg Mag-ox BID, 60 mg IV Solu-medrol BID, PRN Zofran, 20 mEq oral KCl/day, 60 mg oral Prednisone/day.  Labs reviewed; Cl : 100 mmol/L, Ca: 8.4  mg/dL.   8/14 - Diet advanced from CLD to Soft on 8/10. - Pt reports that for breakfast this AM she had 50% of turkey sausage, 75% of breakfast potatoes, and 75% of coffee.  - She reports N/V has been improving and that she did not experience either of these symptoms this AM but she has had a few episodes of N/V with PO intakes since RD visit on 8/9.  - Pt states that she prefers Boost Plus to Ensure. - No weight hx available since admission. IVF: NS @ 50 mL/hr.    8/9 - Pt states she was able to consume beef broth and jello for lunch with no abdominal pain, N/V.  - Pt requested RD order dinner tray for 1945 arrival: beef broth with salt packets, hot tea with sugar.  - Pt states that since admission N/V has resolved.  - She states that it began on 8/6 and that she had an episode similar to this in March or April of this year.  - Pt states that she has had taste alteration of very bland taste while being on chemo; discussed some tips to try after d/c to assist with this.  - Pt states that at home she has Boost and Ensure available and has prescribed Zofran.  - When N/V began on 8/6 the Zofran did not help and she was unable to keep anything down, including liquids.  - Physical assessment shows mild fat wasting, moderate to severe muscle wasting.  - Per chart review, pt has lost 13 lbs (11% body weight) in the past 2 months which is significant for time frame.  -   Will order Boost Breeze while pt is on CLD and change to Ensure with diet advancement. IVF:NS @ 125 mL/hr.     Diet Order:  DIET SOFT Room service appropriate? Yes; Fluid consistency: Thin  Skin:  Reviewed, no issues  Last BM:  8/15  Height:   Ht Readings from Last 1 Encounters:  05/28/16 5' 5" (1.651 m)    Weight:   Wt Readings from Last 1 Encounters:  05/28/16 106 lb 4.8 oz (48.2 kg)    Ideal Body Weight:  56.82 kg  BMI:  Body mass index is 17.69 kg/m.  Estimated Nutritional Needs:   Kcal:  1450-1690  (30-35 kcal/kg)  Protein:  70-80 grams  Fluid:  >/= 1.7 L/day  EDUCATION NEEDS:   No education needs identified at this time    Destiny Ostheim, MS, RD, LDN Inpatient Clinical Dietitian Pager # [phone] After hours/weekend pager # [phone]  

## 2016-06-06 NOTE — Care Management Note (Signed)
Case Management Note  Patient Details  Name: Destiny Harrison MRN: [id number] Date of Birth: 06-16-1941  Subjective/Objective:  Patient already had home 02-[facility]. Noted MD will increase home 02 to 3lnc-[facility] dme rep Jermaine-will await home 02 order for increase & provide 02 tanks @  home.Patient has travel tank for home,& has own transp.                  Action/Plan:d/c home no needs or orders.   Expected Discharge Date:   (unknown)               Expected Discharge Plan:  Home/Self Care  In-House Referral:     Discharge planning Services  CM Consult  Post Acute Care Choice:   [facility] dme-home 02) Choice offered to:     DME Arranged:    DME Agency:     HH Arranged:    [place] Agency:     Status of Service:  Completed, signed off  If discussed at [employer] of Stay Meetings, dates discussed:    Additional Comments:  Dessa Phi, RN 06/06/2016, 12:32 PM

## 2016-06-09 NOTE — Discharge Summary (Signed)
[facility] Discharge Summary   Patient: Destiny Harrison UDJ:[id number]   PCP: Thressa Sheller, MD DOB: 07-Mar-1941   Date of admission: 05/28/2016   Date of discharge: 06/06/2016   Discharge Diagnoses:  Principal Problem:   Chemotherapy-induced enteritis Active Problems:   COPD, moderate ([place])   Primary cancer of right upper lobe of lung (HCC)   Dehydration   Nausea & vomiting   Hypokalemia   Chronic respiratory failure ([place])   Antineoplastic chemotherapy induced anemia   Protein-calorie malnutrition, severe   Sepsis ([place])   Immunocompromised state ([place])   Admitted From: Home Disposition:  Home  Recommendations for Outpatient Follow-up:  1. Follow-up with PCP in one week   Follow-up [place], MD Follow up in 1 week(s).   Specialty:  Internal Medicine Contact information: [address], [address] [place] [address] [phone]        Inc[facility] - Dme [facility] .   Why:  home 02. Contact information: [address] [place] [address] [phone]          Diet recommendation: Regular diet  Activity: The patient is advised to gradually reintroduce usual activities.  Discharge Condition: good  Code Status: DNR/DNI  History of present illness: As per the H and P dictated on admission, "Destiny Harrison is a 75 y.o. female with a past medical history metastatic non-small cell lung cancer diagnosed in March 2013 status post palliative radiation therapy to left lung mass, systemic therapy with carboplatin and Abraxane, also receiving immunotherapy with Nivolumab in 2016, currently receiving chemotherapy with Docetaxel and Cyramza every 3 weeks with Neulasta support, under the care of Dr. Julien Nordmann. She was last seen by Dr. Julien Nordmann on 05/23/2016. She received her last infusion with chemotherapy on 05/23/2016 at the cancer center then got a dose of Neulasta last Saturday. Then on Sunday she developed intractible nausea and vomiting  presenting to the ED on 05/27/2016 where she received IV fluids, IV antiemetic therapy and discharged home.  She reports in the last 24 hours unable to keep even her medications down which has led to uncontrolled pain symptoms (unable to take Norco and MS Contin). She denies abdominal pain, diarrhea, hematemesis, bloody stools, chest pain, shortness of breath."  Hospital Course:  Summary of her active problems in the hospital is as following. Principal Problems: Chemotherapy-induced enteritis/Nausea and vomiting/dehydration Continue supportive management with IV fluids and anti-emetics. No evidence of obstruction. Continue Pepcid twice a day. Tolerating soft diet.  Sepsis secondary to HCAP in an immunocompromised host The patient had a marked increase in her WBC count on 05/31/16, which was associated with tachycardia and tachypnea. She was not hypotensive or febrile. Lactic acid was 1.6. Chest x-ray was negative for obvious pneumonia. Urine cultures done on admission were negative. The patient was subsequently placed on empiric vancomycin and cefepime given her immunocompromised status. Repeat blood cultures and sputum culture were done, and are thus far unrevealing. CT angiogram done 06/01/16, negative for pulmonary embolism but did show interstitial infiltrates consistent with pneumonia. She did receive Neulasta 10 days ago, so this could be contributory. Patient finished 7 days of IV antibiotics in the hospital.  Active Problems: Right upper extremity swelling Upper extremity venous duplex negative.  Severe protein calorie malnutrition Evaluated by dietitian 05/29/16. Supplements ordered.  Anti-neoplastic chemotherapy-induced anemia Monitor hemoglobin. Currently 8.'6mg'$ /dL. No indication for transfusion, but could consider if she becomes symptomatic or hemoglobin drops less than 7.  Hypertension Continue Cardizem.  COPD, moderate ([place]) with chronic  respiratory  failure/COPD exacerbation Continue Pulmicort and bronchodilators as needed. Continue supplemental oxygen. Continue Solu-Medrol, wean as tolerated..  Primary cancer of right upper lobe of lung (HCC)/non-small cell lung cancer She currently receives her care at the [facility], follows Dr. Julien Nordmann. Currently receiving chemotherapy with Docetaxel and Cyramza every 3 weeks with Neulasta support, last infusion on 05/23/2016. Her oncologist was notified of her admission.  Hypokalemia/hypomagnesemia Monitor and replace electrolytes as needed.    All other chronic medical condition were stable during the hospitalization.  Patient was seen by physical therapy, who recommended no PT follow-up. On the day of the discharge the patient's vitals are stable, and no other acute medical condition were reported by patient. the patient was felt safe to be discharge at home with family.  Procedures and Results:  none   Consultations:  Infectious disease  DISCHARGE MEDICATION: Discharge Medication List as of 06/06/2016  2:21 PM    START taking these medications   Details  famotidine (PEPCID) 20 MG tablet Take 1 tablet (20 mg total) by mouth 2 (two) times daily., Starting Thu 06/06/2016, Normal    lactose free nutrition (BOOST PLUS) LIQD Take 237 mLs by mouth 2 (two) times daily between meals., Starting Thu 06/06/2016, Normal      CONTINUE these medications which have CHANGED   Details  diltiazem (DILACOR XR) 240 MG 24 hr capsule Take 1 capsule (240 mg total) by mouth every evening., Starting Thu 06/06/2016, Normal    predniSONE (DELTASONE) 10 MG tablet Take '50mg'$  daily for 3days,Take '40mg'$  daily for 3days,Take '30mg'$  daily for 3days,Take '20mg'$  daily for 3days,Take '10mg'$  daily for 3days, then resume 5 mg daily., Print      CONTINUE these medications which have NOT CHANGED   Details  albuterol (PROAIR HFA) 108 (90 BASE) MCG/ACT inhaler Inhale 2 puffs into the lungs every 6 (six) hours as needed for  wheezing or shortness of breath., Starting 08/31/2015, Until Discontinued, Normal    benzonatate (TESSALON) 100 MG capsule Take 100 mg by mouth every 8 (eight) hours as needed for cough. , Starting 01/05/2016, Until Discontinued, Historical Med    budesonide (PULMICORT) 0.25 MG/2ML nebulizer solution Take 2 mLs (0.25 mg total) by nebulization 2 (two) times daily., Starting 08/20/2015, Until Discontinued, Print    calcium-vitamin D (OSCAL WITH D) 500-200 MG-UNIT per tablet Take 1 tablet by mouth daily., Until Discontinued, Historical Med    Cholecalciferol (VITAMIN D PO) Take 1 tablet by mouth daily., Historical Med    folic acid (FOLVITE) 161 MCG tablet Take 400 mcg by mouth daily., Until Discontinued, Historical Med    furosemide (LASIX) 20 MG tablet Take 20 mg by mouth daily. , Starting 04/30/2016, Until Discontinued, Historical Med    guaiFENesin (MUCINEX) 600 MG 12 hr tablet Take 1 tablet (600 mg total) by mouth 2 (two) times daily., Starting 06/27/2015, Until Discontinued, Print    HYDROcodone-acetaminophen (NORCO/VICODIN) 5-325 MG tablet Take 1 tablet by mouth every 4 (four) hours as needed for moderate pain., Starting Thu 05/23/2016, Print    magnesium oxide (MAG-OX) 400 (241.3 Mg) MG tablet Take 1 tablet (400 mg total) by mouth 2 (two) times daily., Starting 02/18/2016, Until Discontinued, Normal    metoCLOPramide (REGLAN) 10 MG tablet Take 10 mg by mouth every 6 (six) hours as needed for nausea or vomiting. , Starting 03/29/2016, Until Discontinued, Historical Med    morphine (MS CONTIN) 15 MG 12 hr tablet Take 1 tablet (15 mg total) by mouth every 12 (twelve) hours., Starting Thu  05/23/2016, Print    ondansetron (ZOFRAN-ODT) 8 MG disintegrating tablet DISSOLVE 1 TABLET BY MOUTH EVERY 8 HOURS AS NEEDED FOR NAUSEA AND VOMITING, Normal    OXYGEN Inhale 2 L into the lungs continuous., Until Discontinued, Historical Med    polyethylene glycol (MIRALAX / GLYCOLAX) packet MX AND DRK 1 PACKET PO  QD MIXED WITH 8 OUNCES OF FLUID, Historical Med    potassium chloride SA (K-DUR,KLOR-CON) 20 MEQ tablet Take 1 tablet (20 mEq total) by mouth daily., Starting Thu 05/16/2016, Normal    simvastatin (ZOCOR) 5 MG tablet Take 10 mg by mouth at bedtime. , Until Discontinued, Historical Med      STOP taking these medications     dexamethasone (DECADRON) 4 MG tablet        Allergies  Allergen Reactions  . Shellfish Allergy Swelling   Discharge Instructions    Diet - low sodium heart healthy    Complete by:  As directed   Discharge instructions    Complete by:  As directed   It is important that you read following instructions as well as go over your medication list with RN to help you understand your care after this hospitalization.  Discharge Instructions: Please follow-up with PCP in one week  Please request your primary care physician to go over all Hospital Tests and Procedure/Radiological results at the follow up,  Please get all Hospital records sent to your PCP by signing hospital release before you go home.   Do not take more than prescribed Pain, Sleep and Anxiety Medications. You were cared for by a hospitalist during your hospital stay. If you have any questions about your discharge medications or the care you received while you were in the hospital after you are discharged, you can call the unit and ask to speak with the hospitalist on call if the hospitalist that took care of you is not available.  Once you are discharged, your primary care physician will handle any further medical issues. Please note that NO REFILLS for any discharge medications will be authorized once you are discharged, as it is imperative that you return to your primary care physician (or establish a relationship with a primary care physician if you do not have one) for your aftercare needs so that they can reassess your need for medications and monitor your lab values. You Must read complete  instructions/literature along with all the possible adverse reactions/side effects for all the Medicines you take and that have been prescribed to you. Take any new Medicines after you have completely understood and accept all the possible adverse reactions/side effects. Wear Seat belts while driving. If you have smoked or chewed Tobacco in the last 2 yrs please stop smoking and/or stop any Recreational drug use.   Increase activity slowly    Complete by:  As directed     Discharge Exam: Filed Weights   05/28/16 1500 05/28/16 1746  Weight: 52.2 kg (115 lb) 48.2 kg (106 lb 4.8 oz)   Vitals:   06/06/16 0137 06/06/16 0521  BP: (!) 150/76 (!) 155/80  Pulse: 97 88  Resp: 18 18  Temp: 98.3 F (36.8 C) 98 F (36.7 C)   General: Appear in no distress, no Rash; Oral Mucosa moist. Cardiovascular: S1 and S2 Present, no Murmur, no JVD Respiratory: Bilateral Air entry present and Clear to Auscultation, no Crackles, no wheezes Abdomen: Bowel Sound present, Soft and no tenderness Extremities: no Pedal edema, no calf tenderness Neurology: Grossly no focal neuro deficit.  The results of significant diagnostics from this hospitalization (including imaging, microbiology, ancillary and laboratory) are listed below for reference.    Significant Diagnostic Studies: Dg Chest 2 View  Result Date: 05/28/2016 CLINICAL DATA:  SOB increasing x 2 days - currently being treated for lung cancer - hx COPD - hx asthma EXAM: CHEST  2 VIEW COMPARISON:  05/07/2016 FINDINGS: The cardiac silhouette is normal in size. No mediastinal masses or convincing adenopathy. Coarse reticular type opacity extends from superior left hilum. There is fullness from the inferior right hilum. These findings stable. Lungs are hyperexpanded but otherwise clear. No pleural effusion or pneumothorax. Right anterior chest wall Port-A-Cath is stable. Bony thorax is demineralized. Mild compression fracture of a mid to upper thoracic vertebra, also  stable. No convincing osteoblastic or osteolytic lesions. IMPRESSION: No acute cardiopulmonary disease. No significant change from the previous chest radiograph. Electronically Signed   By: Lajean Manes M.D.   On: 05/28/2016 14:49   Ct Angio Chest Pe W Or Wo Contrast  Result Date: 06/01/2016 CLINICAL DATA:  Hemoptysis EXAM: CT ANGIOGRAPHY CHEST WITH CONTRAST TECHNIQUE: Multidetector CT imaging of the chest was performed using the standard protocol during bolus administration of intravenous contrast. Multiplanar CT image reconstructions and MIPs were obtained to evaluate the vascular anatomy. CONTRAST:  100 cc Isovue 370 COMPARISON:  02/12/2016 FINDINGS: There are no filling defects in the pulmonary arterial tree to suggest acute pulmonary thromboembolism. Right peritracheal mass in the upper mediastinum measures 3.2 x 3.1 cm and previously measured 6.4 x 4.5 cm. Is improved. Ill-defined calcifications persist within the mass. Post radiation changes in the medial left lung are stable. Soft tissue prominence in the medial right middle lobe have improved. A focal ground-glass opacity in the lateral basal right lower lobe on image 63 measures 10 mm. Small pleural effusions have developed. Bilateral dependent atelectasis. Partial solid nodule in the left lower lobe measures 5 mm on image 49. There is fluid or mucus material in left lower lobe airways extending to the posterior basal segment. See image 49. Severe emphysema persists. Interlobular septa are more prominent suggesting interstitial edema or pneumonia. Biapical scarring is stable. No pneumothorax. Right jugular Port-A-Cath is stable. Right ventricle is dilated. Diffuse hepatic steatosis. Calcified granuloma in the right lobe of the liver. Tiny hypodensities in the lateral spleen are nonspecific. Mid-level thoracic compression deformities are stable. This includes T4, T5, and probably T6. Review of the MIP images confirms the above findings. IMPRESSION: No  evidence of acute pulmonary thromboembolism New small bilateral pleural effusions with dependent atelectasis New sub cm part solid left lower lobe nodule. New 10 mm ground-glass opacity in the right lower lobe. Central right middle lobe mass and large right paratracheal mass are improved. New interstitial changes suggesting interstitial edema or pneumonia. Electronically Signed   By: Marybelle Killings M.D.   On: 06/01/2016 09:49   Dg Chest Port 1 View  Result Date: 05/31/2016 CLINICAL DATA:  Lung cancer, dyspnea EXAM: PORTABLE CHEST 1 VIEW COMPARISON:  05/28/2016 FINDINGS: Cardiomediastinal silhouette is stable. Hyperinflation again noted. Again noted right paratracheal adenopathy. Stable left perihilar postradiation changes. No definite superimposed infiltrate or pulmonary edema. Mild perihilar bronchitic changes. Right IJ Port-A-Cath is unchanged in position. IMPRESSION: Hyperinflation again noted. Again noted right paratracheal adenopathy. Stable left perihilar postradiation changes. No definite superimposed infiltrate or pulmonary edema. Mild perihilar bronchitic changes. Electronically Signed   By: Lahoma Crocker M.D.   On: 05/31/2016 10:12    Microbiology: Recent Results (from the past  240 hour(s))  Culture, blood (Routine X 2) w Reflex to ID Panel     Status: None   Collection Time: 05/31/16  9:50 AM  Result Value Ref Range Status   Specimen Description BLOOD LEFT ARM  Final   Special Requests IN PEDIATRIC BOTTLE 3 CC  Final   Culture   Final    NO GROWTH 5 DAYS Performed at [facility]    Report Status 06/05/2016 FINAL  Final  Culture, blood (Routine X 2) w Reflex to ID Panel     Status: None   Collection Time: 05/31/16 10:08 AM  Result Value Ref Range Status   Specimen Description BLOOD RIGHT ARM  Final   Special Requests BOTTLES DRAWN AEROBIC AND ANAEROBIC 6 CC EA  Final   Culture   Final    NO GROWTH 5 DAYS Performed at [facility]    Report Status 06/05/2016 FINAL   Final  Culture, expectorated sputum-assessment     Status: None   Collection Time: 05/31/16 11:51 AM  Result Value Ref Range Status   Specimen Description SPUTUM  Final   Special Requests Immunocompromised  Final   Sputum evaluation   Final    THIS SPECIMEN IS ACCEPTABLE. RESPIRATORY CULTURE REPORT TO FOLLOW.   Report Status 05/31/2016 FINAL  Final  Culture, respiratory (NON-Expectorated)     Status: None   Collection Time: 05/31/16 11:51 AM  Result Value Ref Range Status   Specimen Description SPUTUM  Final   Special Requests NONE  Final   Gram Stain   Final    RARE WBC PRESENT, PREDOMINANTLY PMN MODERATE GRAM POSITIVE COCCI IN PAIRS FEW GRAM NEGATIVE RODS Performed at [facility]    Culture MULTIPLE ORGANISMS PRESENT, NONE PREDOMINANT  Final   Report Status 06/02/2016 FINAL  Final  Culture, Urine     Status: None   Collection Time: 06/04/16 12:48 PM  Result Value Ref Range Status   Specimen Description URINE, CLEAN CATCH  Final   Special Requests NONE  Final   Culture NO GROWTH Performed at [facility]   Final   Report Status 06/05/2016 FINAL  Final     Labs: CBC:  Recent Labs Lab 06/03/16 0500 06/04/16 0355 06/05/16 0412  WBC 37.5* 58.4* 42.8*  NEUTROABS  --  55.4*  --   HGB 8.9* 8.6* 7.9*  HCT 27.1* 26.0* 24.0*  MCV 79.7 80.5 79.2  PLT 374 367 387   Basic Metabolic Panel:  Recent Labs Lab 06/03/16 0500 06/04/16 0355 06/05/16 0412  NA 137 138 138  K 4.7 4.3 4.1  CL 98* 98* 100*  CO2 32 31 32  GLUCOSE 138* 194* 145*  BUN '6 14 11  '$ CREATININE 0.43* 0.48 0.50  CALCIUM 8.6* 8.8* 8.4*  MG 1.4*  --   --    BNP (last 3 results)  Recent Labs  03/26/16 1242  BNP 183.6*   CBG: No results for input(s): GLUCAP in the last 168 hours. Time spent: 30 minutes  Signed:  Berle Mull  [facility] 06/06/2016 , 4:57 PM

## 2016-06-10 ENCOUNTER — Telehealth: Payer: Self-pay | Admitting: Medical Oncology

## 2016-06-10 NOTE — Telephone Encounter (Signed)
Ct scan expected tomorrow not scheduled.-sent to [place].

## 2016-06-11 ENCOUNTER — Ambulatory Visit (HOSPITAL_COMMUNITY)
Admission: RE | Admit: 2016-06-11 | Discharge: 2016-06-11 | Disposition: A | Discharge status: Home / Self Care | Payer: Medicare Other | Source: Ambulatory Visit | Attending: Internal Medicine | Admitting: Internal Medicine

## 2016-06-11 ENCOUNTER — Encounter (HOSPITAL_COMMUNITY): Payer: Self-pay

## 2016-06-11 DIAGNOSIS — R000 Tachycardia, unspecified: Secondary | ICD-10-CM | POA: Diagnosis not present

## 2016-06-11 DIAGNOSIS — I700 Atherosclerosis of aorta: Secondary | ICD-10-CM | POA: Insufficient documentation

## 2016-06-11 DIAGNOSIS — M4854XA Collapsed vertebra, not elsewhere classified, thoracic region, initial encounter for fracture: Secondary | ICD-10-CM | POA: Insufficient documentation

## 2016-06-11 DIAGNOSIS — C3411 Malignant neoplasm of upper lobe, right bronchus or lung: Secondary | ICD-10-CM | POA: Diagnosis not present

## 2016-06-11 DIAGNOSIS — I2510 Atherosclerotic heart disease of native coronary artery without angina pectoris: Secondary | ICD-10-CM | POA: Diagnosis not present

## 2016-06-11 DIAGNOSIS — C3490 Malignant neoplasm of unspecified part of unspecified bronchus or lung: Secondary | ICD-10-CM | POA: Diagnosis not present

## 2016-06-11 DIAGNOSIS — M88851 Osteitis deformans of right thigh: Secondary | ICD-10-CM | POA: Diagnosis not present

## 2016-06-11 DIAGNOSIS — M799 Soft tissue disorder, unspecified: Secondary | ICD-10-CM | POA: Insufficient documentation

## 2016-06-11 DIAGNOSIS — J439 Emphysema, unspecified: Secondary | ICD-10-CM | POA: Insufficient documentation

## 2016-06-11 DIAGNOSIS — R590 Localized enlarged lymph nodes: Secondary | ICD-10-CM | POA: Diagnosis not present

## 2016-06-11 DIAGNOSIS — Z9221 Personal history of antineoplastic chemotherapy: Secondary | ICD-10-CM | POA: Diagnosis not present

## 2016-06-11 DIAGNOSIS — Z5111 Encounter for antineoplastic chemotherapy: Secondary | ICD-10-CM

## 2016-06-11 MED ORDER — IOPAMIDOL (ISOVUE-300) INJECTION 61%
100.0000 mL | Freq: Once | INTRAVENOUS | Status: AC | PRN
  Administered 2016-06-11: 100 mL via INTRAVENOUS

## 2016-06-13 ENCOUNTER — Encounter: Payer: Self-pay | Admitting: Internal Medicine

## 2016-06-13 ENCOUNTER — Ambulatory Visit (HOSPITAL_BASED_OUTPATIENT_CLINIC_OR_DEPARTMENT_OTHER): Payer: Medicare Other | Admitting: Internal Medicine

## 2016-06-13 ENCOUNTER — Ambulatory Visit (HOSPITAL_BASED_OUTPATIENT_CLINIC_OR_DEPARTMENT_OTHER): Payer: Medicare Other

## 2016-06-13 ENCOUNTER — Telehealth: Payer: Self-pay | Admitting: Internal Medicine

## 2016-06-13 ENCOUNTER — Other Ambulatory Visit: Payer: Medicare Other

## 2016-06-13 ENCOUNTER — Ambulatory Visit: Payer: Medicare Other

## 2016-06-13 ENCOUNTER — Other Ambulatory Visit (HOSPITAL_BASED_OUTPATIENT_CLINIC_OR_DEPARTMENT_OTHER): Payer: Medicare Other

## 2016-06-13 DIAGNOSIS — Z5112 Encounter for antineoplastic immunotherapy: Secondary | ICD-10-CM | POA: Diagnosis present

## 2016-06-13 DIAGNOSIS — C3411 Malignant neoplasm of upper lobe, right bronchus or lung: Secondary | ICD-10-CM | POA: Diagnosis not present

## 2016-06-13 DIAGNOSIS — Z95828 Presence of other vascular implants and grafts: Secondary | ICD-10-CM

## 2016-06-13 DIAGNOSIS — C3490 Malignant neoplasm of unspecified part of unspecified bronchus or lung: Secondary | ICD-10-CM

## 2016-06-13 LAB — COMPREHENSIVE METABOLIC PANEL
ALBUMIN: 2.9 g/dL — AB (ref 3.5–5.0)
ALK PHOS: 92 U/L (ref 40–150)
ALT: 15 U/L (ref 0–55)
ANION GAP: 12 meq/L — AB (ref 3–11)
AST: 10 U/L (ref 5–34)
BILIRUBIN TOTAL: 0.35 mg/dL (ref 0.20–1.20)
BUN: 19.6 mg/dL (ref 7.0–26.0)
CALCIUM: 9 mg/dL (ref 8.4–10.4)
CO2: 28 mEq/L (ref 22–29)
Chloride: 98 mEq/L (ref 98–109)
Creatinine: 0.7 mg/dL (ref 0.6–1.1)
GLUCOSE: 151 mg/dL — AB (ref 70–140)
Potassium: 3.7 mEq/L (ref 3.5–5.1)
SODIUM: 139 meq/L (ref 136–145)
TOTAL PROTEIN: 6.3 g/dL — AB (ref 6.4–8.3)

## 2016-06-13 LAB — CBC WITH DIFFERENTIAL/PLATELET
BASO%: 0 % (ref 0.0–2.0)
BASOS ABS: 0 10*3/uL (ref 0.0–0.1)
EOS ABS: 0 10*3/uL (ref 0.0–0.5)
EOS%: 0 % (ref 0.0–7.0)
HEMATOCRIT: 27.3 % — AB (ref 34.8–46.6)
HEMOGLOBIN: 8.8 g/dL — AB (ref 11.6–15.9)
LYMPH%: 2.4 % — ABNORMAL LOW (ref 14.0–49.7)
MCH: 26.1 pg (ref 25.1–34.0)
MCHC: 32.2 g/dL (ref 31.5–36.0)
MCV: 81 fL (ref 79.5–101.0)
MONO#: 0.3 10*3/uL (ref 0.1–0.9)
MONO%: 2.5 % (ref 0.0–14.0)
NEUT%: 95.1 % — ABNORMAL HIGH (ref 38.4–76.8)
NEUTROS ABS: 9.6 10*3/uL — AB (ref 1.5–6.5)
PLATELETS: 484 10*3/uL — AB (ref 145–400)
RBC: 3.37 10*6/uL — ABNORMAL LOW (ref 3.70–5.45)
RDW: 19.9 % — AB (ref 11.2–14.5)
WBC: 10.1 10*3/uL (ref 3.9–10.3)
lymph#: 0.2 10*3/uL — ABNORMAL LOW (ref 0.9–3.3)

## 2016-06-13 MED ORDER — DIPHENHYDRAMINE HCL 50 MG/ML IJ SOLN
INTRAMUSCULAR | Status: AC
  Filled 2016-06-13: qty 1

## 2016-06-13 MED ORDER — HYDROCODONE-ACETAMINOPHEN 5-325 MG PO TABS: 1.0000 | ORAL_TABLET | ORAL | 0 refills | Status: DC | PRN

## 2016-06-13 MED ORDER — SODIUM CHLORIDE 0.9% FLUSH
10.0000 mL | INTRAVENOUS | Status: DC | PRN
  Administered 2016-06-13: 10 mL
  Filled 2016-06-13: qty 10

## 2016-06-13 MED ORDER — SODIUM CHLORIDE 0.9 % IV SOLN
60.0000 mg/m2 | Freq: Once | INTRAVENOUS | Status: AC
  Administered 2016-06-13: 100 mg via INTRAVENOUS
  Filled 2016-06-13: qty 10

## 2016-06-13 MED ORDER — SODIUM CHLORIDE 0.9 % IV SOLN
10.0000 mg | Freq: Once | INTRAVENOUS | Status: AC
  Administered 2016-06-13: 10 mg via INTRAVENOUS
  Filled 2016-06-13: qty 1

## 2016-06-13 MED ORDER — ACETAMINOPHEN 325 MG PO TABS
ORAL_TABLET | ORAL | Status: AC
  Filled 2016-06-13: qty 2

## 2016-06-13 MED ORDER — SODIUM CHLORIDE 0.9 % IV SOLN
Freq: Once | INTRAVENOUS | Status: AC
  Administered 2016-06-13: 14:00:00 via INTRAVENOUS

## 2016-06-13 MED ORDER — SODIUM CHLORIDE 0.9 % IJ SOLN
10.0000 mL | INTRAMUSCULAR | Status: DC | PRN
  Administered 2016-06-13: 10 mL via INTRAVENOUS
  Filled 2016-06-13: qty 10

## 2016-06-13 MED ORDER — RAMUCIRUMAB CHEMO INJECTION 500 MG/50ML
10.0000 mg/kg | Freq: Once | INTRAVENOUS | Status: AC
  Administered 2016-06-13: 500 mg via INTRAVENOUS
  Filled 2016-06-13: qty 50

## 2016-06-13 MED ORDER — HEPARIN SOD (PORK) LOCK FLUSH 100 UNIT/ML IV SOLN
500.0000 [IU] | Freq: Once | INTRAVENOUS | Status: AC | PRN
  Administered 2016-06-13: 500 [IU]
  Filled 2016-06-13: qty 5

## 2016-06-13 MED ORDER — DIPHENHYDRAMINE HCL 50 MG/ML IJ SOLN
50.0000 mg | Freq: Once | INTRAMUSCULAR | Status: AC
  Administered 2016-06-13: 50 mg via INTRAVENOUS

## 2016-06-13 MED ORDER — ACETAMINOPHEN 325 MG PO TABS
650.0000 mg | ORAL_TABLET | Freq: Once | ORAL | Status: AC
  Administered 2016-06-13: 650 mg via ORAL

## 2016-06-13 NOTE — Progress Notes (Signed)
[facility] [phone]   Fax:([phone]) [place], [address], [address] [place] [address]  DIAGNOSIS: Metastatic non-small cell lung cancer, squamous cell carcinoma diagnosed in March of 2013.   PRIOR THERAPY:  1. Status post palliative radiotherapy to the left lung mass under the care of Dr. Pablo Ledger completed on 03/06/2012.  2. Systemic chemotherapy with carboplatin for AUC of 6 on day 1 and Abraxane 100 mg/M2 on days 1, 8 and 15 every 3 weeks. Status post 2 cycles. From cycle 3 forward AUC will be decreased to 4.5 given on day 1 and the Abraxane will be decreased to 90 mg per meter squared on days 1, 8 and 15 every 3 weeks, Status post a total of 3 cycles.  3. Systemic chemotherapy with carboplatin for AUC of 5 on day 1 and gemcitabine 1000 mg/m2 given on day 1 and day 8 every 3 weeks,status post 1 cycle. Due to significant neutropenia she will be dosed reduced beginning cycle 2 forward to carboplatin at an AUC of 4 given on day 1 and gemcitabine at 800 mg per meter squared given on days 1 and 8 every 3 weeks. Status post 6 cycles.  4. Status post palliative radiotherapy to the right hilum under the care of Dr. Pablo Ledger completed on 11/26/2013. 5. Immunotherapy with Nivolumab 240 MG every 2 weeks. First dose expected 03/07/2015. Status post 20 cycles, last dose was given 01/18/2016 discontinued secondary to disease progression.  CURRENT THERAPY: Systemic chemotherapy with docetaxel 60 MG/M2 and Cyramza 10 mg/KG every 3 weeks. First dose 02/08/2016. Status post 5 cycles.  CHEMOTHERAPY INTENT: Palliative  CURRENT # OF CHEMOTHERAPY CYCLES: 6  CURRENT ANTIEMETICS: Compazine  CURRENT SMOKING STATUS: Former smoker  ORAL CHEMOTHERAPY AND CONSENT: None  CURRENT BISPHOSPHONATES USE: None  PAIN MANAGEMENT: 5/10 right shoulder currently on Norco  NARCOTICS INDUCED CONSTIPATION: None  LIVING WILL AND CODE  STATUS: No CODE BLUE   INTERVAL HISTORY: Destiny Harrison 75 y.o. female returns to the clinic today for followup visit accompanied by her son. The patient is feeling fine today except for the persistent shortness of breath. She was admitted to [facility] 2 weeks ago with significant fatigue and weakness as well as shortness of breath. She is feeling a little bit better today. She is tolerating her current treatment with docetaxel and Cyramza fairly well with no significant complaints. She denied having any significant chest pain but continues to have cough with no hemoptysis. She has no significant weight loss or night sweats. The patient denied having any significant fever or chills, nausea or vomiting. She had repeat CT scan of the chest, abdomen and pelvis performed recently and she is here for evaluation and discussion of her scan results.  MEDICAL HISTORY: Past Medical History:  Diagnosis Date  . Asthma   . COPD (chronic obstructive pulmonary disease) ([facility])   . DNR (do not resuscitate) 06/26/2015  . Dysphagia 12/07/2015  . Encounter for antineoplastic immunotherapy 05/22/2015  . Hiatal hernia   . History of chemotherapy   . History of radiation therapy 03/06/2012   left hilum  . History of radiation therapy 05/10/2013-05/31/2013   Left lung/ 33/75'@2'$ .25 per fraction x 15 fractions  . Hyperlipidemia   . Neutropenia, drug-induced ([place]) 05/05/2012  . Non-small cell lung cancer ([place]) dx'd 08/28/11   left lung  . Primary cancer of right upper lobe of lung ([place]) 02/06/2012  . Radiation 11/15/13-11/26/13  Right hilum 30 Gy in 10 fractions  . Rheumatoid arthritis(714.0)     ALLERGIES:  is allergic to shellfish allergy.  MEDICATIONS:  Current Outpatient Prescriptions  Medication Sig Dispense Refill  . albuterol (PROAIR HFA) 108 (90 BASE) MCG/ACT inhaler Inhale 2 puffs into the lungs every 6 (six) hours as needed for wheezing or shortness of breath. 1 Inhaler 5  . benzonatate (TESSALON) 100 MG  capsule Take 100 mg by mouth every 8 (eight) hours as needed for cough.   1  . budesonide (PULMICORT) 0.25 MG/2ML nebulizer solution Take 2 mLs (0.25 mg total) by nebulization 2 (two) times daily. (Patient taking differently: Take 0.25 mg by nebulization 3 (three) times daily. ) 60 mL 12  . calcium-vitamin D (OSCAL WITH D) 500-200 MG-UNIT per tablet Take 1 tablet by mouth daily.    . Cholecalciferol (VITAMIN D PO) Take 1 tablet by mouth daily.    Marland Kitchen diltiazem (DILACOR XR) 240 MG 24 hr capsule Take 1 capsule (240 mg total) by mouth every evening. 30 capsule 0  . famotidine (PEPCID) 20 MG tablet Take 1 tablet (20 mg total) by mouth 2 (two) times daily. 30 tablet 0  . folic acid (FOLVITE) 161 MCG tablet Take 400 mcg by mouth daily.    . furosemide (LASIX) 20 MG tablet Take 20 mg by mouth daily.   5  . guaiFENesin (MUCINEX) 600 MG 12 hr tablet Take 1 tablet (600 mg total) by mouth 2 (two) times daily. 30 tablet 1  . HYDROcodone-acetaminophen (NORCO/VICODIN) 5-325 MG tablet Take 1 tablet by mouth every 4 (four) hours as needed for moderate pain. 60 tablet 0  . lactose free nutrition (BOOST PLUS) LIQD Take 237 mLs by mouth 2 (two) times daily between meals. 14 Can 0  . magnesium oxide (MAG-OX) 400 (241.3 Mg) MG tablet Take 1 tablet (400 mg total) by mouth 2 (two) times daily. 60 tablet 0  . metoCLOPramide (REGLAN) 10 MG tablet Take 10 mg by mouth every 6 (six) hours as needed for nausea or vomiting.   0  . morphine (MS CONTIN) 15 MG 12 hr tablet Take 1 tablet (15 mg total) by mouth every 12 (twelve) hours. 60 tablet 0  . ondansetron (ZOFRAN-ODT) 8 MG disintegrating tablet DISSOLVE 1 TABLET BY MOUTH EVERY 8 HOURS AS NEEDED FOR NAUSEA AND VOMITING 20 tablet 0  . OXYGEN Inhale 2 L into the lungs continuous.    . polyethylene glycol (MIRALAX / GLYCOLAX) packet MX AND DRK 1 PACKET PO QD MIXED WITH 8 OUNCES OF FLUID  0  . potassium chloride SA (K-DUR,KLOR-CON) 20 MEQ tablet Take 1 tablet (20 mEq total) by mouth  daily. 7 tablet 0  . predniSONE (DELTASONE) 10 MG tablet Take '50mg'$  daily for 3days,Take '40mg'$  daily for 3days,Take '30mg'$  daily for 3days,Take '20mg'$  daily for 3days,Take '10mg'$  daily for 3days, then resume 5 mg daily. 45 tablet 0  . simvastatin (ZOCOR) 5 MG tablet Take 10 mg by mouth at bedtime.      No current facility-administered medications for this visit.     SURGICAL HISTORY:  Past Surgical History:  Procedure Laterality Date  . appendex  1962  . surgery on right wrist    . VIDEO BRONCHOSCOPY  01/28/2012   Procedure: VIDEO BRONCHOSCOPY WITHOUT FLUORO;  Surgeon: Brand Males, MD;  Location: [facility] ENDOSCOPY;  Service: Endoscopy;  Laterality: Bilateral;    REVIEW OF SYSTEMS:  Constitutional: positive for fatigue Eyes: negative Ears, nose, mouth, throat, and face: negative Respiratory: positive  for cough, dyspnea on exertion and wheezing Cardiovascular: negative Gastrointestinal: negative Genitourinary:negative Integument/breast: negative Hematologic/lymphatic: negative Musculoskeletal:negative Neurological: negative Behavioral/Psych: negative Endocrine: negative Allergic/Immunologic: negative   PHYSICAL EXAMINATION: General appearance: alert, cooperative and no distress Head: Normocephalic, without obvious abnormality, atraumatic Neck: no adenopathy, no JVD, supple, symmetrical, trachea midline and thyroid not enlarged, symmetric, no tenderness/mass/nodules Lymph nodes: Cervical, supraclavicular, and axillary nodes normal. Resp: clear to auscultation bilaterally Back: symmetric, no curvature. ROM normal. No CVA tenderness. Cardio: regular rate and rhythm, S1, S2 normal, no murmur, click, rub or gallop GI: soft, non-tender; bowel sounds normal; no masses,  no organomegaly Extremities: extremities normal, atraumatic, no cyanosis or edema Neurologic: Alert and oriented X 3, normal strength and tone. Normal symmetric reflexes. Normal coordination and gait  ECOG PERFORMANCE STATUS: 1  - Symptomatic but completely ambulatory  Blood pressure (!) 133/53, pulse (!) 101, temperature 98.3 F (36.8 C), temperature source Oral, resp. rate 17, height '5\' 5"'$  (1.651 m), weight 111 lb 1.6 oz (50.4 kg), SpO2 98 %.  LABORATORY DATA: Lab Results  Component Value Date   WBC 10.1 06/13/2016   HGB 8.8 (L) 06/13/2016   HCT 27.3 (L) 06/13/2016   MCV 81.0 06/13/2016   PLT 484 (H) 06/13/2016      Chemistry      Component Value Date/Time   NA 139 06/13/2016 1124   K 3.7 06/13/2016 1124   CL 100 (L) 06/05/2016 0412   CL 101 01/12/2013 0810   CO2 28 06/13/2016 1124   BUN 19.6 06/13/2016 1124   CREATININE 0.7 06/13/2016 1124      Component Value Date/Time   CALCIUM 9.0 06/13/2016 1124   ALKPHOS 92 06/13/2016 1124   AST 10 06/13/2016 1124   ALT 15 06/13/2016 1124   BILITOT 0.35 06/13/2016 1124       RADIOGRAPHIC STUDIES: Dg Chest 2 View  Result Date: 05/28/2016 CLINICAL DATA:  SOB increasing x 2 days - currently being treated for lung cancer - hx COPD - hx asthma EXAM: CHEST  2 VIEW COMPARISON:  05/07/2016 FINDINGS: The cardiac silhouette is normal in size. No mediastinal masses or convincing adenopathy. Coarse reticular type opacity extends from superior left hilum. There is fullness from the inferior right hilum. These findings stable. Lungs are hyperexpanded but otherwise clear. No pleural effusion or pneumothorax. Right anterior chest wall Port-A-Cath is stable. Bony thorax is demineralized. Mild compression fracture of a mid to upper thoracic vertebra, also stable. No convincing osteoblastic or osteolytic lesions. IMPRESSION: No acute cardiopulmonary disease. No significant change from the previous chest radiograph. Electronically Signed   By: Lajean Manes M.D.   On: 05/28/2016 14:49   Ct Chest W Contrast  Result Date: 06/11/2016 CLINICAL DATA:  Followup lung cancer. EXAM: CT CHEST, ABDOMEN, AND PELVIS WITH CONTRAST TECHNIQUE: Multidetector CT imaging of the chest, abdomen and  pelvis was performed following the standard protocol during bolus administration of intravenous contrast. CONTRAST:  141m ISOVUE-300 IOPAMIDOL (ISOVUE-300) INJECTION 61% COMPARISON:  04/16/2016 FINDINGS: CT CHEST FINDINGS Cardiovascular: The heart size is normal. No pericardial effusion. There is aortic atherosclerosis. Calcification within the LAD, and left circumflex coronary artery noted. Mediastinum/Nodes: The trachea appears patent and is midline. Unremarkable appearance of the esophagus. Partially calcified anterior mediastinal, right paratracheal mass measures 3.4 x 3.4 cm, image 12 of series 5. This is compared with 3.9 x 3.7 cm previously. Sub- carinal lymph node measures 7 mm, image 24 of series 5. On the previous exam this measured 10 mm. Lungs/Pleura: No pleural  effusion. Advanced changes of centrilobular and paraseptal emphysema identified. Paramediastinal radiation change within the left lung is unchanged from previous exam. The perihilar right lung lesion measures 1.8 x 1.5 cm, image 30 of series 5. On the previous exam this measured 1.7 x 1.5 cm. Tiny nodule in the right lower lobe is unchanged measuring 2 mm, image 84 of series 4. Musculoskeletal: No aggressive lytic or sclerotic bone lesions identified. T4 compression deformity is stable from previous exam. CT ABDOMEN PELVIS FINDINGS Hepatobiliary: Stable hypervascular structure within the medial segment of left lobe of liver measuring 1 cm, image 59 of series 5. Stable 5 mm adjacent hypervascular lesion, image number 62 of series 5. No intrahepatic bile duct dilatation. The gallbladder appears normal. Pancreas: No inflammation or mass identified. Spleen: The spleen appears normal. Adrenals/Urinary Tract: The adrenal glands are normal. Unremarkable appearance of the right kidney. There is scarring involving the inferior pole of the right kidney. Urinary bladder is unremarkable. Stomach/Bowel: The stomach is normal. The small bowel loops have a  normal course and caliber. The colon is negative. Vascular/Lymphatic: Calcified atherosclerotic disease involves the abdominal aorta. No aneurysm. No enlarged retroperitoneal or mesenteric adenopathy. No enlarged pelvic or inguinal lymph nodes. Reproductive: The uterus and adnexal structures are unremarkable for patient's age. Other: There is no ascites or focal fluid collections within the abdomen or pelvis. Musculoskeletal: There is degenerative disc disease noted within the lumbar spine. 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 IMPRESSION: 1. No new or progressive metastatic disease. 2. Large anterior mediastinal right paratracheal lymph node is mildly decreased in size in the interval. There has been decrease in size of the sub- carinal lymph node. 3. Right perihilar soft tissue mass is stable to minimally increased in size in the interval. 4. Emphysema 5. Aortic atherosclerosis and coronary artery calcifications 6. Emphysema 7. Stable appearance of chronic T4 compression deformity and pagetoid disease involving the right femur. Electronically Signed   By: Kerby Moors M.D.   On: 06/11/2016 08:55   Ct Angio Chest Pe W Or Wo Contrast  Result Date: 06/01/2016 CLINICAL DATA:  Hemoptysis EXAM: CT ANGIOGRAPHY CHEST WITH CONTRAST TECHNIQUE: Multidetector CT imaging of the chest was performed using the standard protocol during bolus administration of intravenous contrast. Multiplanar CT image reconstructions and MIPs were obtained to evaluate the vascular anatomy. CONTRAST:  100 cc Isovue 370 COMPARISON:  02/12/2016 FINDINGS: There are no filling defects in the pulmonary arterial tree to suggest acute pulmonary thromboembolism. Right peritracheal mass in the upper mediastinum measures 3.2 x 3.1 cm and previously measured 6.4 x 4.5 cm. Is improved. Ill-defined calcifications persist within the mass. Post radiation changes in the medial left lung are stable. Soft tissue prominence in the medial right  middle lobe have improved. A focal ground-glass opacity in the lateral basal right lower lobe on image 63 measures 10 mm. Small pleural effusions have developed. Bilateral dependent atelectasis. Partial solid nodule in the left lower lobe measures 5 mm on image 49. There is fluid or mucus material in left lower lobe airways extending to the posterior basal segment. See image 49. Severe emphysema persists. Interlobular septa are more prominent suggesting interstitial edema or pneumonia. Biapical scarring is stable. No pneumothorax. Right jugular Port-A-Cath is stable. Right ventricle is dilated. Diffuse hepatic steatosis. Calcified granuloma in the right lobe of the liver. Tiny hypodensities in the lateral spleen are nonspecific. Mid-level thoracic compression deformities are stable. This includes T4, T5, and probably T6. Review of the MIP images confirms  the above findings. IMPRESSION: No evidence of acute pulmonary thromboembolism New small bilateral pleural effusions with dependent atelectasis New sub cm part solid left lower lobe nodule. New 10 mm ground-glass opacity in the right lower lobe. Central right middle lobe mass and large right paratracheal mass are improved. New interstitial changes suggesting interstitial edema or pneumonia. Electronically Signed   By: Marybelle Killings M.D.   On: 06/01/2016 09:49   Ct Abdomen Pelvis W Contrast  Result Date: 06/11/2016 CLINICAL DATA:  Followup lung cancer. EXAM: CT CHEST, ABDOMEN, AND PELVIS WITH CONTRAST TECHNIQUE: Multidetector CT imaging of the chest, abdomen and pelvis was performed following the standard protocol during bolus administration of intravenous contrast. CONTRAST:  117m ISOVUE-300 IOPAMIDOL (ISOVUE-300) INJECTION 61% COMPARISON:  04/16/2016 FINDINGS: CT CHEST FINDINGS Cardiovascular: The heart size is normal. No pericardial effusion. There is aortic atherosclerosis. Calcification within the LAD, and left circumflex coronary artery noted.  Mediastinum/Nodes: The trachea appears patent and is midline. Unremarkable appearance of the esophagus. Partially calcified anterior mediastinal, right paratracheal mass measures 3.4 x 3.4 cm, image 12 of series 5. This is compared with 3.9 x 3.7 cm previously. Sub- carinal lymph node measures 7 mm, image 24 of series 5. On the previous exam this measured 10 mm. Lungs/Pleura: No pleural effusion. Advanced changes of centrilobular and paraseptal emphysema identified. Paramediastinal radiation change within the left lung is unchanged from previous exam. The perihilar right lung lesion measures 1.8 x 1.5 cm, image 30 of series 5. On the previous exam this measured 1.7 x 1.5 cm. Tiny nodule in the right lower lobe is unchanged measuring 2 mm, image 84 of series 4. Musculoskeletal: No aggressive lytic or sclerotic bone lesions identified. T4 compression deformity is stable from previous exam. CT ABDOMEN PELVIS FINDINGS Hepatobiliary: Stable hypervascular structure within the medial segment of left lobe of liver measuring 1 cm, image 59 of series 5. Stable 5 mm adjacent hypervascular lesion, image number 62 of series 5. No intrahepatic bile duct dilatation. The gallbladder appears normal. Pancreas: No inflammation or mass identified. Spleen: The spleen appears normal. Adrenals/Urinary Tract: The adrenal glands are normal. Unremarkable appearance of the right kidney. There is scarring involving the inferior pole of the right kidney. Urinary bladder is unremarkable. Stomach/Bowel: The stomach is normal. The small bowel loops have a normal course and caliber. The colon is negative. Vascular/Lymphatic: Calcified atherosclerotic disease involves the abdominal aorta. No aneurysm. No enlarged retroperitoneal or mesenteric adenopathy. No enlarged pelvic or inguinal lymph nodes. Reproductive: The uterus and adnexal structures are unremarkable for patient's age. Other: There is no ascites or focal fluid collections within the  abdomen or pelvis. Musculoskeletal: There is degenerative disc disease noted within the lumbar spine. 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 IMPRESSION: 1. No new or progressive metastatic disease. 2. Large anterior mediastinal right paratracheal lymph node is mildly decreased in size in the interval. There has been decrease in size of the sub- carinal lymph node. 3. Right perihilar soft tissue mass is stable to minimally increased in size in the interval. 4. Emphysema 5. Aortic atherosclerosis and coronary artery calcifications 6. Emphysema 7. Stable appearance of chronic T4 compression deformity and pagetoid disease involving the right femur. Electronically Signed   By: TKerby MoorsM.D.   On: 06/11/2016 08:55   Dg Chest Port 1 View  Result Date: 05/31/2016 CLINICAL DATA:  Lung cancer, dyspnea EXAM: PORTABLE CHEST 1 VIEW COMPARISON:  05/28/2016 FINDINGS: Cardiomediastinal silhouette is stable. Hyperinflation again noted. Again noted right paratracheal  adenopathy. Stable left perihilar postradiation changes. No definite superimposed infiltrate or pulmonary edema. Mild perihilar bronchitic changes. Right IJ Port-A-Cath is unchanged in position. IMPRESSION: Hyperinflation again noted. Again noted right paratracheal adenopathy. Stable left perihilar postradiation changes. No definite superimposed infiltrate or pulmonary edema. Mild perihilar bronchitic changes. Electronically Signed   By: Lahoma Crocker M.D.   On: 05/31/2016 10:12   ASSESSMENT AND PLAN: This is a very pleasant 75 years old African American female with metastatic non-small cell lung cancer, squamous cell carcinoma status post several chemotherapy regimen and and completed a course of palliative radiotherapy to the right hilum in February 2015.  She completed a course of treatment with immunotherapy with Nivolumab status post 20 cycles. This was discontinued today secondary to disease progression with further enlargement of the large  right paratracheal lymph node. I discussed the scan results and showed the images to the patient and her son. I recommended for her to discontinue her current treatment with Nivolumab at this point. She is currently on systemic chemotherapy with docetaxel 60 MG/M2 and Cyramza 10 MG/KG every 3 weeks with Neulasta support. Status post 6 cycles and tolerating her treatment well. The recent CT scan of the chest showed no evidence for disease progression. I discussed the scan results with the patient and her son. I recommended for her to continue her current treatment with docetaxel and Cyramza with cycle #7 today. For pain management, she will continue on MS Contin in addition to Vicodin. I gave her a refill of Vicodin today.  She would come back for follow-up visit in 3 weeks for evaluation before starting cycle #8. She was advised to call immediately if she has any concerning symptoms in the interval.  The patient voices understanding of current disease status and treatment options and is in agreement with the current care plan.  All questions were answered. The patient knows to call the clinic with any problems, questions or concerns. We can certainly see the patient much sooner if necessary.  Disclaimer: This note was dictated with voice recognition software. Similar sounding words can inadvertently be transcribed and may not be corrected upon review.

## 2016-06-13 NOTE — Patient Instructions (Signed)
[facility] Discharge Instructions for Patients Receiving Chemotherapy  Today you received the following chemotherapy agents Cyramza/Taxotere  To help prevent nausea and vomiting after your treatment, we encourage you to take your nausea medication    If you develop nausea and vomiting that is not controlled by your nausea medication, call the clinic.   BELOW ARE SYMPTOMS THAT SHOULD BE REPORTED IMMEDIATELY:  *FEVER GREATER THAN 100.5 F  *CHILLS WITH OR WITHOUT FEVER  NAUSEA AND VOMITING THAT IS NOT CONTROLLED WITH YOUR NAUSEA MEDICATION  *UNUSUAL SHORTNESS OF BREATH  *UNUSUAL BRUISING OR BLEEDING  TENDERNESS IN MOUTH AND THROAT WITH OR WITHOUT PRESENCE OF ULCERS  *URINARY PROBLEMS  *BOWEL PROBLEMS  UNUSUAL RASH Items with * indicate a potential emergency and should be followed up as soon as possible.  Feel free to call the clinic you have any questions or concerns. The clinic phone number is [phone].  Please show the [facility] at check-in to the Emergency Department and triage nurse.

## 2016-06-13 NOTE — Telephone Encounter (Signed)
Gave patient avs report and appointments for August thru October  °

## 2016-06-15 ENCOUNTER — Ambulatory Visit (HOSPITAL_BASED_OUTPATIENT_CLINIC_OR_DEPARTMENT_OTHER): Payer: Medicare Other

## 2016-06-15 VITALS — BP 141/59 | HR 111 | Temp 98.5°F

## 2016-06-15 DIAGNOSIS — C3411 Malignant neoplasm of upper lobe, right bronchus or lung: Secondary | ICD-10-CM

## 2016-06-15 MED ORDER — PEGFILGRASTIM INJECTION 6 MG/0.6ML ~~LOC~~
6.0000 mg | PREFILLED_SYRINGE | Freq: Once | SUBCUTANEOUS | Status: AC
  Administered 2016-06-15: 6 mg via SUBCUTANEOUS

## 2016-06-15 NOTE — Patient Instructions (Signed)
Pegfilgrastim injection What is this medicine? PEGFILGRASTIM (PEG fil gra stim) is a long-acting granulocyte colony-stimulating factor that stimulates the growth of neutrophils, a type of white blood cell important in the body's fight against infection. It is used to reduce the incidence of fever and infection in patients with certain types of cancer who are receiving chemotherapy that affects the bone marrow, and to increase survival after being exposed to high doses of radiation. This medicine may be used for other purposes; ask your health care provider or pharmacist if you have questions. What should I tell my health care provider before I take this medicine? They need to know if you have any of these conditions: -kidney disease -latex allergy -ongoing radiation therapy -sickle cell disease -skin reactions to acrylic adhesives (On-Body Injector only) -an unusual or allergic reaction to pegfilgrastim, filgrastim, other medicines, foods, dyes, or preservatives -pregnant or trying to get pregnant -breast-feeding How should I use this medicine? This medicine is for injection under the skin. If you get this medicine at home, you will be taught how to prepare and give the pre-filled syringe or how to use the On-body Injector. Refer to the patient Instructions for Use for detailed instructions. Use exactly as directed. Take your medicine at regular intervals. Do not take your medicine more often than directed. It is important that you put your used needles and syringes in a special sharps container. Do not put them in a trash can. If you do not have a sharps container, call your pharmacist or healthcare provider to get one. Talk to your pediatrician regarding the use of this medicine in children. While this drug may be prescribed for selected conditions, precautions do apply. Overdosage: If you think you have taken too much of this medicine contact a poison control center or emergency room at  once. NOTE: This medicine is only for you. Do not share this medicine with others. What if I miss a dose? It is important not to miss your dose. Call your doctor or health care professional if you miss your dose. If you miss a dose due to an On-body Injector failure or leakage, a new dose should be administered as soon as possible using a single prefilled syringe for manual use. What may interact with this medicine? Interactions have not been studied. Give your health care provider a list of all the medicines, herbs, non-prescription drugs, or dietary supplements you use. Also tell them if you smoke, drink alcohol, or use illegal drugs. Some items may interact with your medicine. This list may not describe all possible interactions. Give your health care provider a list of all the medicines, herbs, non-prescription drugs, or dietary supplements you use. Also tell them if you smoke, drink alcohol, or use illegal drugs. Some items may interact with your medicine. What should I watch for while using this medicine? You may need blood work done while you are taking this medicine. If you are going to need a MRI, CT scan, or other procedure, tell your doctor that you are using this medicine (On-Body Injector only). What side effects may I notice from receiving this medicine? Side effects that you should report to your doctor or health care professional as soon as possible: -allergic reactions like skin rash, itching or hives, swelling of the face, lips, or tongue -dizziness -fever -pain, redness, or irritation at site where injected -pinpoint red spots on the skin -red or dark-brown urine -shortness of breath or breathing problems -stomach or side pain, or pain   at the shoulder -swelling -tiredness -trouble passing urine or change in the amount of urine Side effects that usually do not require medical attention (report to your doctor or health care professional if they continue or are  bothersome): -bone pain -muscle pain This list may not describe all possible side effects. Call your doctor for medical advice about side effects. You may report side effects to FDA at 1-800-FDA-1088. Where should I keep my medicine? Keep out of the reach of children. Store pre-filled syringes in a refrigerator between 2 and 8 degrees C (36 and 46 degrees F). Do not freeze. Keep in carton to protect from light. Throw away this medicine if it is left out of the refrigerator for more than 48 hours. Throw away any unused medicine after the expiration date. NOTE: This sheet is a summary. It may not cover all possible information. If you have questions about this medicine, talk to your doctor, pharmacist, or health care provider.    2016, Elsevier/Gold Standard. (2014-10-27 14:30:14)  

## 2016-06-18 ENCOUNTER — Encounter (HOSPITAL_COMMUNITY): Payer: Self-pay

## 2016-06-18 ENCOUNTER — Other Ambulatory Visit: Payer: Self-pay

## 2016-06-18 ENCOUNTER — Inpatient Hospital Stay (HOSPITAL_COMMUNITY)
Admission: EM | Admit: 2016-06-18 | Discharge: 2016-06-21 | DRG: 871 | Disposition: A | Discharge status: Home / Self Care | Payer: Medicare Other | Attending: Internal Medicine | Admitting: Internal Medicine

## 2016-06-18 ENCOUNTER — Other Ambulatory Visit (HOSPITAL_COMMUNITY): Payer: Self-pay

## 2016-06-18 ENCOUNTER — Emergency Department (HOSPITAL_COMMUNITY): Payer: Medicare Other

## 2016-06-18 DIAGNOSIS — Z79899 Other long term (current) drug therapy: Secondary | ICD-10-CM | POA: Diagnosis not present

## 2016-06-18 DIAGNOSIS — T451X5A Adverse effect of antineoplastic and immunosuppressive drugs, initial encounter: Secondary | ICD-10-CM | POA: Diagnosis present

## 2016-06-18 DIAGNOSIS — J9611 Chronic respiratory failure with hypoxia: Secondary | ICD-10-CM | POA: Diagnosis present

## 2016-06-18 DIAGNOSIS — Z803 Family history of malignant neoplasm of breast: Secondary | ICD-10-CM | POA: Diagnosis not present

## 2016-06-18 DIAGNOSIS — J449 Chronic obstructive pulmonary disease, unspecified: Secondary | ICD-10-CM

## 2016-06-18 DIAGNOSIS — R079 Chest pain, unspecified: Secondary | ICD-10-CM | POA: Diagnosis not present

## 2016-06-18 DIAGNOSIS — N390 Urinary tract infection, site not specified: Secondary | ICD-10-CM | POA: Diagnosis present

## 2016-06-18 DIAGNOSIS — C3410 Malignant neoplasm of upper lobe, unspecified bronchus or lung: Secondary | ICD-10-CM | POA: Diagnosis present

## 2016-06-18 DIAGNOSIS — Z7951 Long term (current) use of inhaled steroids: Secondary | ICD-10-CM

## 2016-06-18 DIAGNOSIS — J441 Chronic obstructive pulmonary disease with (acute) exacerbation: Secondary | ICD-10-CM | POA: Diagnosis present

## 2016-06-18 DIAGNOSIS — E871 Hypo-osmolality and hyponatremia: Secondary | ICD-10-CM | POA: Diagnosis present

## 2016-06-18 DIAGNOSIS — G8929 Other chronic pain: Secondary | ICD-10-CM | POA: Diagnosis not present

## 2016-06-18 DIAGNOSIS — D6481 Anemia due to antineoplastic chemotherapy: Secondary | ICD-10-CM | POA: Diagnosis not present

## 2016-06-18 DIAGNOSIS — C3411 Malignant neoplasm of upper lobe, right bronchus or lung: Secondary | ICD-10-CM

## 2016-06-18 DIAGNOSIS — E876 Hypokalemia: Secondary | ICD-10-CM | POA: Diagnosis present

## 2016-06-18 DIAGNOSIS — K219 Gastro-esophageal reflux disease without esophagitis: Secondary | ICD-10-CM | POA: Diagnosis present

## 2016-06-18 DIAGNOSIS — A419 Sepsis, unspecified organism: Principal | ICD-10-CM | POA: Diagnosis present

## 2016-06-18 DIAGNOSIS — Z923 Personal history of irradiation: Secondary | ICD-10-CM | POA: Diagnosis not present

## 2016-06-18 DIAGNOSIS — Z9221 Personal history of antineoplastic chemotherapy: Secondary | ICD-10-CM | POA: Diagnosis not present

## 2016-06-18 DIAGNOSIS — Z87891 Personal history of nicotine dependence: Secondary | ICD-10-CM | POA: Diagnosis not present

## 2016-06-18 DIAGNOSIS — Z681 Body mass index (BMI) 19 or less, adult: Secondary | ICD-10-CM | POA: Diagnosis not present

## 2016-06-18 DIAGNOSIS — E785 Hyperlipidemia, unspecified: Secondary | ICD-10-CM | POA: Diagnosis present

## 2016-06-18 DIAGNOSIS — R000 Tachycardia, unspecified: Secondary | ICD-10-CM | POA: Diagnosis present

## 2016-06-18 DIAGNOSIS — M069 Rheumatoid arthritis, unspecified: Secondary | ICD-10-CM | POA: Diagnosis present

## 2016-06-18 DIAGNOSIS — R509 Fever, unspecified: Secondary | ICD-10-CM | POA: Diagnosis not present

## 2016-06-18 DIAGNOSIS — Z833 Family history of diabetes mellitus: Secondary | ICD-10-CM

## 2016-06-18 DIAGNOSIS — J440 Chronic obstructive pulmonary disease with acute lower respiratory infection: Secondary | ICD-10-CM | POA: Diagnosis not present

## 2016-06-18 DIAGNOSIS — R0602 Shortness of breath: Secondary | ICD-10-CM

## 2016-06-18 DIAGNOSIS — C799 Secondary malignant neoplasm of unspecified site: Secondary | ICD-10-CM | POA: Diagnosis not present

## 2016-06-18 DIAGNOSIS — Z7952 Long term (current) use of systemic steroids: Secondary | ICD-10-CM | POA: Diagnosis not present

## 2016-06-18 DIAGNOSIS — Z66 Do not resuscitate: Secondary | ICD-10-CM | POA: Diagnosis present

## 2016-06-18 DIAGNOSIS — E43 Unspecified severe protein-calorie malnutrition: Secondary | ICD-10-CM | POA: Diagnosis not present

## 2016-06-18 DIAGNOSIS — L8990 Pressure ulcer of unspecified site, unspecified stage: Secondary | ICD-10-CM | POA: Diagnosis present

## 2016-06-18 LAB — CBC WITH DIFFERENTIAL/PLATELET
BASOS ABS: 0 10*3/uL (ref 0.0–0.1)
BASOS PCT: 1 %
EOS PCT: 1 %
Eosinophils Absolute: 0 10*3/uL (ref 0.0–0.7)
HEMATOCRIT: 28.2 % — AB (ref 36.0–46.0)
HEMOGLOBIN: 9.3 g/dL — AB (ref 12.0–15.0)
LYMPHS PCT: 13 %
Lymphs Abs: 0.5 10*3/uL — ABNORMAL LOW (ref 0.7–4.0)
MCH: 26.6 pg (ref 26.0–34.0)
MCHC: 33 g/dL (ref 30.0–36.0)
MCV: 80.6 fL (ref 78.0–100.0)
MONOS PCT: 7 %
Monocytes Absolute: 0.3 10*3/uL (ref 0.1–1.0)
NEUTROS ABS: 3.3 10*3/uL (ref 1.7–7.7)
Neutrophils Relative %: 78 %
Platelets: 202 10*3/uL (ref 150–400)
RBC: 3.5 MIL/uL — AB (ref 3.87–5.11)
RDW: 20.1 % — ABNORMAL HIGH (ref 11.5–15.5)
WBC: 4.1 10*3/uL (ref 4.0–10.5)

## 2016-06-18 LAB — URINALYSIS, ROUTINE W REFLEX MICROSCOPIC
BILIRUBIN URINE: NEGATIVE
GLUCOSE, UA: NEGATIVE mg/dL
HGB URINE DIPSTICK: NEGATIVE
Ketones, ur: NEGATIVE mg/dL
Nitrite: POSITIVE — AB
PH: 8 (ref 5.0–8.0)
Protein, ur: NEGATIVE mg/dL
SPECIFIC GRAVITY, URINE: 1.011 (ref 1.005–1.030)

## 2016-06-18 LAB — COMPREHENSIVE METABOLIC PANEL
ALBUMIN: 3.3 g/dL — AB (ref 3.5–5.0)
ALT: 13 U/L — AB (ref 14–54)
AST: 13 U/L — AB (ref 15–41)
Alkaline Phosphatase: 78 U/L (ref 38–126)
Anion gap: 9 (ref 5–15)
BILIRUBIN TOTAL: 1.4 mg/dL — AB (ref 0.3–1.2)
BUN: 20 mg/dL (ref 6–20)
CHLORIDE: 93 mmol/L — AB (ref 101–111)
CO2: 32 mmol/L (ref 22–32)
CREATININE: 0.68 mg/dL (ref 0.44–1.00)
Calcium: 8.1 mg/dL — ABNORMAL LOW (ref 8.9–10.3)
GFR calc Af Amer: >60 mL/min (ref >60)
GFR calc non Af Amer: >60 mL/min (ref >60)
GLUCOSE: 86 mg/dL (ref 65–99)
POTASSIUM: 2.8 mmol/L — AB (ref 3.5–5.1)
Sodium: 134 mmol/L — ABNORMAL LOW (ref 135–145)
Total Protein: 5.9 g/dL — ABNORMAL LOW (ref 6.5–8.1)

## 2016-06-18 LAB — URINE MICROSCOPIC-ADD ON

## 2016-06-18 LAB — I-STAT TROPONIN, ED: TROPONIN I, POC: 0.01 ng/mL (ref 0.00–0.08)

## 2016-06-18 LAB — I-STAT CG4 LACTIC ACID, ED: Lactic Acid, Venous: 1.13 mmol/L (ref 0.5–1.9)

## 2016-06-18 MED ORDER — VANCOMYCIN HCL IN DEXTROSE 1-5 GM/200ML-% IV SOLN
1000.0000 mg | Freq: Once | INTRAVENOUS | Status: AC
  Administered 2016-06-18: 1000 mg via INTRAVENOUS
  Filled 2016-06-18: qty 200

## 2016-06-18 MED ORDER — SODIUM CHLORIDE 0.9 % IV BOLUS (SEPSIS)
500.0000 mL | Freq: Once | INTRAVENOUS | Status: AC
  Administered 2016-06-18: 500 mL via INTRAVENOUS

## 2016-06-18 MED ORDER — PIPERACILLIN-TAZOBACTAM 3.375 G IVPB: 3.3750 g | Freq: Three times a day (TID) | INTRAVENOUS | Status: DC

## 2016-06-18 MED ORDER — ACETAMINOPHEN 325 MG PO TABS
650.0000 mg | ORAL_TABLET | Freq: Once | ORAL | Status: AC | PRN
  Administered 2016-06-18: 650 mg via ORAL
  Filled 2016-06-18: qty 2

## 2016-06-18 MED ORDER — PIPERACILLIN-TAZOBACTAM 3.375 G IVPB 30 MIN
3.3750 g | Freq: Once | INTRAVENOUS | Status: AC
  Administered 2016-06-18: 3.375 g via INTRAVENOUS
  Filled 2016-06-18: qty 50

## 2016-06-18 MED ORDER — SODIUM CHLORIDE 0.9 % IV BOLUS (SEPSIS)
1000.0000 mL | Freq: Once | INTRAVENOUS | Status: AC
  Administered 2016-06-18: 1000 mL via INTRAVENOUS

## 2016-06-18 MED ORDER — SODIUM CHLORIDE 0.9 % IV SOLN
1000.0000 mL | INTRAVENOUS | Status: DC
  Administered 2016-06-18: 1000 mL via INTRAVENOUS

## 2016-06-18 MED ORDER — VANCOMYCIN HCL IN DEXTROSE 1-5 GM/200ML-% IV SOLN: 1000.0000 mg | INTRAVENOUS | Status: DC

## 2016-06-18 MED ORDER — POTASSIUM CHLORIDE 10 MEQ/100ML IV SOLN
10.0000 meq | Freq: Once | INTRAVENOUS | Status: AC
  Administered 2016-06-18: 10 meq via INTRAVENOUS
  Filled 2016-06-18: qty 100

## 2016-06-18 MED ORDER — IPRATROPIUM-ALBUTEROL 0.5-2.5 (3) MG/3ML IN SOLN
3.0000 mL | Freq: Once | RESPIRATORY_TRACT | Status: AC
  Administered 2016-06-18: 3 mL via RESPIRATORY_TRACT
  Filled 2016-06-18: qty 3

## 2016-06-18 MED ORDER — MORPHINE SULFATE (PF) 4 MG/ML IV SOLN
4.0000 mg | Freq: Once | INTRAVENOUS | Status: AC
  Administered 2016-06-18: 4 mg via INTRAVENOUS
  Filled 2016-06-18: qty 1

## 2016-06-18 NOTE — ED Notes (Signed)
Patient transported to X-ray 

## 2016-06-18 NOTE — ED Notes (Signed)
PT HAS RIGHT SIDED CHEST PORT

## 2016-06-18 NOTE — ED Notes (Signed)
Per Claiborne Billings PA, waiting for XR results before starting antibiotics.

## 2016-06-18 NOTE — ED Notes (Signed)
PA at bedside.

## 2016-06-18 NOTE — ED Provider Notes (Signed)
[facility] DEPT Provider Note   CSN: [id number] Arrival date & time: 06/18/16  1905     History   Chief Complaint Chief Complaint  Patient presents with  . Cancer  . Fever  . Shortness of Breath    HPI Destiny Harrison is a 75 y.o. female who presents with fever and SOB. PMH significant for NSCLC of the right upper lobe currently on chemo and radiation, COPD on O2, chronic pain, and RA. She was recently admitted from 8/8 - 8/17 for Chemo induced enteritis and HCAP. Her last chemo dose with Dr. Julien Nordmann was on 8/24. She last had her Neulasta 3 days ago which she states always makes her more SOB. Today she started to have a fever so came to the ED. She reports associated chest pain which is chronic. She takes Morphine BID and Norco for breakthrough pain. Denies chills, cough, abdominal pain, N/V/D, irritative voiding symptoms.    HPI  Past Medical History:  Diagnosis Date  . Asthma   . COPD (chronic obstructive pulmonary disease) ([place])   . DNR (do not resuscitate) 06/26/2015  . Dysphagia 12/07/2015  . Encounter for antineoplastic immunotherapy 05/22/2015  . Hiatal hernia   . History of chemotherapy   . History of radiation therapy 03/06/2012   left hilum  . History of radiation therapy 05/10/2013-05/31/2013   Left lung/ 33/75'@2'$ .25 per fraction x 15 fractions  . Hyperlipidemia   . Neutropenia, drug-induced ([place]) 05/05/2012  . Non-small cell lung cancer ([place]) dx'd 08/28/11   left lung  . Primary cancer of right upper lobe of lung ([place]) 02/06/2012  . Radiation 11/15/13-11/26/13   Right hilum 30 Gy in 10 fractions  . Rheumatoid arthritis(714.0)     Patient Active Problem List   Diagnosis Date Noted  . Sepsis ([place]) 05/31/2016  . Immunocompromised state ([place]) 05/31/2016  . Protein-calorie malnutrition, severe 05/30/2016  . Chemotherapy-induced enteritis 05/29/2016  . Chronic respiratory failure ([place]) 05/29/2016  . Antineoplastic chemotherapy induced anemia 05/29/2016  . Nausea & vomiting  05/28/2016  . Hypokalemia 05/28/2016  . Port catheter in place 05/07/2016  . Dehydration 05/07/2016  . Encounter for antineoplastic chemotherapy 04/11/2016  . Chronic pain 12/21/2015  . Dysphagia 12/07/2015  . HCAP (healthcare-associated pneumonia) 08/18/2015  . Palliative care encounter   . DNR (do not resuscitate) 06/26/2015  . Sinus tachycardia ([place]) 06/26/2015  . Encounter for antineoplastic immunotherapy 05/22/2015  . COPD exacerbation ([place]) 04/22/2015  . Fatigue 02/28/2015  . Chronic cough 02/06/2014  . Primary cancer of right upper lobe of lung ([place]) 02/06/2012  . COPD, moderate ([place]) 11/18/2011  . Dyspnea 11/04/2011    Past Surgical History:  Procedure Laterality Date  . appendex  1962  . surgery on right wrist    . VIDEO BRONCHOSCOPY  01/28/2012   Procedure: VIDEO BRONCHOSCOPY WITHOUT FLUORO;  Surgeon: Brand Males, MD;  Location: [facility] ENDOSCOPY;  Service: Endoscopy;  Laterality: Bilateral;    OB History    No data available       Home Medications    Prior to Admission medications   Medication Sig Start Date End Date Taking? Authorizing Provider  albuterol (PROAIR HFA) 108 (90 BASE) MCG/ACT inhaler Inhale 2 puffs into the lungs every 6 (six) hours as needed for wheezing or shortness of breath. 08/31/15   Brand Males, MD  benzonatate (TESSALON) 100 MG capsule Take 100 mg by mouth every 8 (eight) hours as needed for cough.  01/05/16   Historical Provider, MD  budesonide (PULMICORT) 0.25 MG/2ML nebulizer solution  Take 2 mLs (0.25 mg total) by nebulization 2 (two) times daily. Patient taking differently: Take 0.25 mg by nebulization 3 (three) times daily.  08/20/15   Belkys A Regalado, MD  calcium-vitamin D (OSCAL WITH D) 500-200 MG-UNIT per tablet Take 1 tablet by mouth daily.    Historical Provider, MD  Cholecalciferol (VITAMIN D PO) Take 1 tablet by mouth daily.    Historical Provider, MD  dexamethasone (DECADRON) 4 MG tablet  04/09/16   Historical Provider, MD    DILT-XR 180 MG 24 hr capsule  04/01/16   Historical Provider, MD  diltiazem (DILACOR XR) 240 MG 24 hr capsule Take 1 capsule (240 mg total) by mouth every evening. 06/06/16   Lavina Hamman, MD  famotidine (PEPCID) 20 MG tablet Take 1 tablet (20 mg total) by mouth 2 (two) times daily. 06/06/16   Lavina Hamman, MD  folic acid (FOLVITE) 045 MCG tablet Take 400 mcg by mouth daily.    Historical Provider, MD  furosemide (LASIX) 20 MG tablet Take 20 mg by mouth daily.  04/30/16   Historical Provider, MD  guaiFENesin (MUCINEX) 600 MG 12 hr tablet Take 1 tablet (600 mg total) by mouth 2 (two) times daily. 06/27/15   Reyne Dumas, MD  HYDROcodone-acetaminophen (NORCO/VICODIN) 5-325 MG tablet Take 1 tablet by mouth every 4 (four) hours as needed for moderate pain. 06/13/16   Curt Bears, MD  lactose free nutrition (BOOST PLUS) LIQD Take 237 mLs by mouth 2 (two) times daily between meals. 06/06/16   Lavina Hamman, MD  magnesium oxide (MAG-OX) 400 (241.3 Mg) MG tablet Take 1 tablet (400 mg total) by mouth 2 (two) times daily. 02/18/16   Maryann Mikhail, DO  metoCLOPramide (REGLAN) 10 MG tablet Take 10 mg by mouth every 6 (six) hours as needed for nausea or vomiting.  03/29/16   Historical Provider, MD  morphine (MS CONTIN) 15 MG 12 hr tablet Take 1 tablet (15 mg total) by mouth every 12 (twelve) hours. 05/23/16   Curt Bears, MD  ondansetron (ZOFRAN-ODT) 8 MG disintegrating tablet DISSOLVE 1 TABLET BY MOUTH EVERY 8 HOURS AS NEEDED FOR NAUSEA AND VOMITING 04/04/16   Curt Bears, MD  OXYGEN Inhale 2 L into the lungs continuous.    Historical Provider, MD  pantoprazole (PROTONIX) 40 MG tablet  05/28/16   Historical Provider, MD  polyethylene glycol (MIRALAX / GLYCOLAX) packet MX AND DRK 1 PACKET PO QD MIXED WITH 8 OUNCES OF FLUID 02/22/16   Historical Provider, MD  potassium chloride SA (K-DUR,KLOR-CON) 20 MEQ tablet Take 1 tablet (20 mEq total) by mouth daily. 05/16/16   Wyatt Portela, MD  predniSONE (DELTASONE) 10  MG tablet Take '50mg'$  daily for 3days,Take '40mg'$  daily for 3days,Take '30mg'$  daily for 3days,Take '20mg'$  daily for 3days,Take '10mg'$  daily for 3days, then resume 5 mg daily. 06/06/16   Lavina Hamman, MD  simvastatin (ZOCOR) 5 MG tablet Take 10 mg by mouth at bedtime.     Historical Provider, MD    Family History Family History  Problem Relation Age of Onset  . Diabetes Mother     insulin dependent  . Breast cancer Mother     Social History Social History  Substance Use Topics  . Smoking status: Former Smoker    Packs/day: 0.50    Years: 40.00    Types: Cigarettes    Quit date: 10/29/2009  . Smokeless tobacco: Never Used  . Alcohol use No     Allergies   Shellfish allergy  Review of Systems Review of Systems  Constitutional: Negative for chills and fever.  Respiratory: Positive for shortness of breath. Negative for cough.   Cardiovascular: Positive for chest pain.  Gastrointestinal: Negative for abdominal pain, diarrhea, nausea and vomiting.  Genitourinary: Negative for dysuria, flank pain and frequency.  All other systems reviewed and are negative.    Physical Exam Updated Vital Signs BP 127/64 (BP Location: Left Arm)   Pulse (!) 132   Temp 100.7 F (38.2 C) (Oral)   Resp 18   SpO2 97%   Physical Exam  Constitutional: She is oriented to person, place, and time. She appears well-developed and well-nourished. No distress.  3L O2 via House  HENT:  Head: Normocephalic and atraumatic.  Eyes: Conjunctivae are normal. Pupils are equal, round, and reactive to light. Right eye exhibits no discharge. Left eye exhibits no discharge. No scleral icterus.  Neck: Normal range of motion. Neck supple.  Cardiovascular: Regular rhythm.  Tachycardia present.   No murmur heard. Pulmonary/Chest: Accessory muscle usage present. Tachypnea noted. No respiratory distress. She has decreased breath sounds in the right upper field, the right middle field and the right lower field. She has wheezes in  the left upper field, the left middle field and the left lower field. She has rhonchi.  Abdominal: Soft. Bowel sounds are normal. She exhibits no distension and no mass. There is no tenderness. There is no rebound and no guarding. No hernia.  Musculoskeletal: She exhibits no edema.  1+ pitting edema bilaterally in lower legs  Neurological: She is alert and oriented to person, place, and time.  Skin: Skin is warm and dry.  Psychiatric: She has a normal mood and affect. Her behavior is normal.  Nursing note and vitals reviewed.    ED Treatments / Results  Labs (all labs ordered are listed, but only abnormal results are displayed) Labs Reviewed  COMPREHENSIVE METABOLIC PANEL - Abnormal; Notable for the following:       Result Value   Sodium 134 (*)    Potassium 2.8 (*)    Chloride 93 (*)    Calcium 8.1 (*)    Total Protein 5.9 (*)    Albumin 3.3 (*)    AST 13 (*)    ALT 13 (*)    Total Bilirubin 1.4 (*)    All other components within normal limits  URINALYSIS, ROUTINE W REFLEX MICROSCOPIC (NOT AT [facility]) - Abnormal; Notable for the following:    APPearance CLOUDY (*)    Nitrite POSITIVE (*)    Leukocytes, UA MODERATE (*)    All other components within normal limits  CBC WITH DIFFERENTIAL/PLATELET - Abnormal; Notable for the following:    RBC 3.50 (*)    Hemoglobin 9.3 (*)    HCT 28.2 (*)    RDW 20.1 (*)    Lymphs Abs 0.5 (*)    All other components within normal limits  URINE MICROSCOPIC-ADD ON - Abnormal; Notable for the following:    Squamous Epithelial / LPF 0-5 (*)    Bacteria, UA MANY (*)    All other components within normal limits  APTT - Abnormal; Notable for the following:    aPTT 40 (*)    All other components within normal limits  MAGNESIUM - Abnormal; Notable for the following:    Magnesium 1.0 (*)    All other components within normal limits  BASIC METABOLIC PANEL - Abnormal; Notable for the following:    Sodium 134 (*)    Potassium 3.3 (*)  Chloride 96  (*)    Glucose, Bld 103 (*)    Calcium 7.7 (*)    All other components within normal limits  CBC - Abnormal; Notable for the following:    WBC 1.5 (*)    RBC 3.46 (*)    Hemoglobin 8.9 (*)    HCT 27.4 (*)    MCH 25.7 (*)    RDW 19.6 (*)    All other components within normal limits  DIFFERENTIAL - Abnormal; Notable for the following:    Neutro Abs 1.1 (*)    Lymphs Abs 0.2 (*)    All other components within normal limits  MRSA PCR SCREENING  CULTURE, BLOOD (ROUTINE X 2)  CULTURE, BLOOD (ROUTINE X 2)  URINE CULTURE  PROCALCITONIN  PROTIME-INR  CBC WITH DIFFERENTIAL/PLATELET  I-STAT CG4 LACTIC ACID, ED  I-STAT TROPOININ, ED  I-STAT CG4 LACTIC ACID, ED    EKG  EKG Interpretation None       Radiology Dg Chest 2 View  Result Date: 06/18/2016 CLINICAL DATA:  Shortness of breath, LEFT chest pain and fever this morning. History of COPD, lung cancer. EXAM: CHEST  2 VIEW COMPARISON:  COPD and RIGHT hilar lymphadenopathy. No acute cardiopulmonary process. FINDINGS: Cardiac silhouette is normal. Fullness of the RIGHT hila corresponding to known mediastinal lymph nodes. No pleural effusion or focal consolidation. Increased lung volumes consistent with history of COPD. Single lumen RIGHT chest Port-A-Cath with distal tip projecting in proximal superior vena cava. High-riding RIGHT humeral head most compatible with old rotator cuff injury. Broad levoscoliosis. IMPRESSION: COPD and RIGHT hilar mass/lymphadenopathy without acute cardiopulmonary process. Electronically Signed   By: Elon Alas M.D.   On: 06/18/2016 20:41    Procedures Procedures (including critical care time)  Medications Ordered in ED Medications  simvastatin (ZOCOR) tablet 10 mg (not administered)  HYDROcodone-acetaminophen (NORCO/VICODIN) 5-325 MG per tablet 1 tablet (not administered)  pantoprazole (PROTONIX) EC tablet 80 mg (80 mg Oral Given 06/19/16 1009)  diltiazem (DILACOR XR) 24 hr capsule 240 mg (not  administered)  famotidine (PEPCID) tablet 20 mg (20 mg Oral Given 06/19/16 1009)  lactose free nutrition (BOOST PLUS) liquid 237 mL (237 mLs Oral Given 06/19/16 1012)  morphine (MS CONTIN) 12 hr tablet 15 mg (15 mg Oral Given 06/19/16 1009)  potassium chloride SA (K-DUR,KLOR-CON) CR tablet 20 mEq (20 mEq Oral Given 06/19/16 1009)  metoCLOPramide (REGLAN) tablet 10 mg (not administered)  polyethylene glycol (MIRALAX / GLYCOLAX) packet 17 g (17 g Oral Given 06/19/16 1009)  magnesium oxide (MAG-OX) tablet 400 mg (400 mg Oral Given 06/19/16 1009)  benzonatate (TESSALON) capsule 100 mg (not administered)  budesonide (PULMICORT) nebulizer solution 0.25 mg (0.25 mg Nebulization Not Given 06/19/16 0943)  guaiFENesin (MUCINEX) 12 hr tablet 600 mg (600 mg Oral Given 06/19/16 1009)  calcium-vitamin D (OSCAL WITH D) 500-200 MG-UNIT per tablet 1 tablet (1 tablet Oral Given 6/73/41 9379)  folic acid (FOLVITE) tablet 0.5 mg (0.5 mg Oral Given 06/19/16 1009)  enoxaparin (LOVENOX) injection 40 mg (40 mg Subcutaneous Given 06/19/16 1009)  sodium chloride flush (NS) 0.9 % injection 3 mL (3 mLs Intravenous Given 06/19/16 1012)  0.9 % NaCl with KCl 40 mEq / L  infusion (0 mL/hr Intravenous Stopped 06/19/16 1022)  magnesium hydroxide (MILK OF MAGNESIA) suspension 30 mL (not administered)  bisacodyl (DULCOLAX) EC tablet 5 mg (not administered)  ondansetron (ZOFRAN) tablet 4 mg (not administered)    Or  ondansetron (ZOFRAN) injection 4 mg (not administered)  ceFEPIme (MAXIPIME) 1  g in dextrose 5 % 50 mL IVPB (1 g Intravenous Given 06/19/16 0506)  ipratropium-albuterol (DUONEB) 0.5-2.5 (3) MG/3ML nebulizer solution 3 mL (not administered)  methylPREDNISolone sodium succinate (SOLU-MEDROL) 125 mg/2 mL injection 60 mg (60 mg Intravenous Given 06/19/16 1349)  ipratropium-albuterol (DUONEB) 0.5-2.5 (3) MG/3ML nebulizer solution 3 mL (not administered)  sodium chloride flush (NS) 0.9 % injection 10-40 mL (10 mLs Intracatheter Not  Given 06/19/16 1012)  sodium chloride flush (NS) 0.9 % injection 10-40 mL (not administered)  acetaminophen (TYLENOL) tablet 650 mg (650 mg Oral Given 06/18/16 1929)  ipratropium-albuterol (DUONEB) 0.5-2.5 (3) MG/3ML nebulizer solution 3 mL (3 mLs Nebulization Given 06/18/16 2049)  potassium chloride 10 mEq in 100 mL IVPB (10 mEq Intravenous New Bag/Given 06/18/16 2322)  sodium chloride 0.9 % bolus 1,000 mL (1,000 mLs Intravenous New Bag/Given 06/18/16 2127)    And  sodium chloride 0.9 % bolus 500 mL (0 mLs Intravenous Stopped 06/18/16 2241)  piperacillin-tazobactam (ZOSYN) IVPB 3.375 g (0 g Intravenous Stopped 06/18/16 2227)  vancomycin (VANCOCIN) IVPB 1000 mg/200 mL premix (1,000 mg Intravenous New Bag/Given 06/18/16 2227)  morphine 4 MG/ML injection 4 mg (4 mg Intravenous Given 06/18/16 2320)  magnesium sulfate IVPB 2 g 50 mL (2 g Intravenous Given 06/19/16 0153)     Initial Impression / Assessment and Plan / ED Course  I have reviewed the triage vital signs and the nursing notes.  Pertinent labs & imaging results that were available during my care of the patient were reviewed by me and considered in my medical decision making (see chart for details).  Clinical Course   75 year old female presents with sepsis. She is febrile with Tmax of 100.7. Tylenol given. Her heart rate is initially in the 130s and she is tachypneic. Otherwise she is not hypoxic and normotensive. Code Sepsis initiated. IVF and Vanc and Zosyn started. EKG shows sinus tachycardia. CBC remarkable for anemia which is at baseline. CMP remarkable for hypokalemia. 67mq K given IV. She also has mild hyponatremia, hypochloridemia, low calcium, total protein, and low albumin. She is known to have malnutrition. Lactic acid is normal. Troponin is normal. UA shows positive nitrites and moderate leukocytes with many bacteria although patient denies dysuria. Culture sent. CXR shows stable COPD and right hilar mass - no acute processes. Morphine  and Duoneb given for per patient request. Will have patient admitted for further management.    Final Clinical Impressions(s) / ED Diagnoses   Final diagnoses:  Sepsis, due to unspecified organism ([place]  SOB (shortness of breath)    New Prescriptions New Prescriptions   No medications on file     KRecardo Evangelist PA-C 06/19/16 1[place] MD 06/22/16 [phone]

## 2016-06-18 NOTE — ED Notes (Signed)
Bed: QJ33 Expected date:  Expected time:  Means of arrival:  Comments: Triage 1

## 2016-06-18 NOTE — Progress Notes (Signed)
Pharmacy Antibiotic Note  Destiny Harrison is a 75 y.o. female admitted on 06/18/2016 with sepsis.  Code Sepsis initiated and Vancomycin 1gm and Zosyn 3.375gm IV x 1 dose each ordered in ED.  PMH includes lung cancer, undergoing chemotherapy, and presents with fever and shortness of breath.  Patient recently admitted for enteritis (discharged 05/28/16). Pharmacy has been consulted for Vancomycin & Zosyn dosing.  Plan: Zosyn 3.375g IV q8h (4 hour infusion).  Vancomycin 1gm IV q24h  - Vancomycin Trough Goal: 15-20 mcg/ml F/U cultures Follow renal function    Height: '5\' 5"'$  (165.1 cm) Weight: 109 lb (49.4 kg) IBW/kg (Calculated) : 57  Temp (24hrs), Avg:100.7 F (38.2 C), Min:100.7 F (38.2 C), Max:100.7 F (38.2 C)   Recent Labs Lab 06/13/16 1124 06/18/16 2002 06/18/16 2020  WBC 10.1 4.1  --   CREATININE 0.7 0.68  --   LATICACIDVEN  --   --  1.13    Estimated Creatinine Clearance: 46.7 mL/min (by C-G formula based on SCr of 0.8 mg/dL).    Allergies  Allergen Reactions  . Shellfish Allergy Swelling    Antimicrobials this admission: 8/29 Vanc >>   8/29 Zosyn >>    Dose adjustments this admission:    Microbiology results: 8/29 BCx: sent 8/29 UCx: sent   Thank you for allowing pharmacy to be a part of this patient's care.  Everette Rank, PharmD 06/18/2016 9:12 PM

## 2016-06-18 NOTE — ED Triage Notes (Signed)
Stage IV Lung CA pt presents w/ c/o fever and sob. Pt has chemo last Thursday and Neulasta injection Saturday. Pt denies n/v. Pt denies cp. Pt A+OX4, in no acute distress.

## 2016-06-19 ENCOUNTER — Other Ambulatory Visit: Payer: Self-pay

## 2016-06-19 DIAGNOSIS — T451X5A Adverse effect of antineoplastic and immunosuppressive drugs, initial encounter: Secondary | ICD-10-CM

## 2016-06-19 DIAGNOSIS — J9611 Chronic respiratory failure with hypoxia: Secondary | ICD-10-CM

## 2016-06-19 DIAGNOSIS — C3411 Malignant neoplasm of upper lobe, right bronchus or lung: Secondary | ICD-10-CM

## 2016-06-19 DIAGNOSIS — A419 Sepsis, unspecified organism: Principal | ICD-10-CM

## 2016-06-19 DIAGNOSIS — L8990 Pressure ulcer of unspecified site, unspecified stage: Secondary | ICD-10-CM | POA: Insufficient documentation

## 2016-06-19 DIAGNOSIS — E876 Hypokalemia: Secondary | ICD-10-CM

## 2016-06-19 DIAGNOSIS — G8929 Other chronic pain: Secondary | ICD-10-CM

## 2016-06-19 DIAGNOSIS — D6481 Anemia due to antineoplastic chemotherapy: Secondary | ICD-10-CM

## 2016-06-19 DIAGNOSIS — J441 Chronic obstructive pulmonary disease with (acute) exacerbation: Secondary | ICD-10-CM

## 2016-06-19 DIAGNOSIS — N390 Urinary tract infection, site not specified: Secondary | ICD-10-CM

## 2016-06-19 LAB — CBC
HCT: 27.4 % — ABNORMAL LOW (ref 36.0–46.0)
Hemoglobin: 8.9 g/dL — ABNORMAL LOW (ref 12.0–15.0)
MCH: 25.7 pg — AB (ref 26.0–34.0)
MCHC: 32.5 g/dL (ref 30.0–36.0)
MCV: 79.2 fL (ref 78.0–100.0)
PLATELETS: 166 10*3/uL (ref 150–400)
RBC: 3.46 MIL/uL — ABNORMAL LOW (ref 3.87–5.11)
RDW: 19.6 % — ABNORMAL HIGH (ref 11.5–15.5)
WBC: 1.5 10*3/uL — ABNORMAL LOW (ref 4.0–10.5)

## 2016-06-19 LAB — DIFFERENTIAL
BASOS ABS: 0 10*3/uL (ref 0.0–0.1)
BASOS PCT: 1 %
EOS PCT: 0 %
Eosinophils Absolute: 0 10*3/uL (ref 0.0–0.7)
LYMPHS ABS: 0.2 10*3/uL — AB (ref 0.7–4.0)
Lymphocytes Relative: 12 %
MONO ABS: 0.2 10*3/uL (ref 0.1–1.0)
Monocytes Relative: 11 %
Neutro Abs: 1.1 10*3/uL — ABNORMAL LOW (ref 1.7–7.7)
Neutrophils Relative %: 76 %

## 2016-06-19 LAB — BASIC METABOLIC PANEL
Anion gap: 8 (ref 5–15)
BUN: 15 mg/dL (ref 6–20)
CALCIUM: 7.7 mg/dL — AB (ref 8.9–10.3)
CO2: 30 mmol/L (ref 22–32)
CREATININE: 0.62 mg/dL (ref 0.44–1.00)
Chloride: 96 mmol/L — ABNORMAL LOW (ref 101–111)
GFR calc Af Amer: >60 mL/min (ref >60)
GFR calc non Af Amer: >60 mL/min (ref >60)
GLUCOSE: 103 mg/dL — AB (ref 65–99)
Potassium: 3.3 mmol/L — ABNORMAL LOW (ref 3.5–5.1)
Sodium: 134 mmol/L — ABNORMAL LOW (ref 135–145)

## 2016-06-19 LAB — I-STAT CG4 LACTIC ACID, ED: LACTIC ACID, VENOUS: 0.69 mmol/L (ref 0.5–1.9)

## 2016-06-19 LAB — MAGNESIUM: Magnesium: 1 mg/dL — ABNORMAL LOW (ref 1.7–2.4)

## 2016-06-19 LAB — PROTIME-INR
INR: 1.06
Prothrombin Time: 13.9 seconds (ref 11.4–15.2)

## 2016-06-19 LAB — MRSA PCR SCREENING: MRSA BY PCR: NEGATIVE

## 2016-06-19 LAB — APTT: APTT: 40 s — AB (ref 24–36)

## 2016-06-19 LAB — PROCALCITONIN: PROCALCITONIN: 0.39 ng/mL

## 2016-06-19 MED ORDER — POTASSIUM CHLORIDE IN NACL 40-0.9 MEQ/L-% IV SOLN
INTRAVENOUS | Status: AC
  Administered 2016-06-19: 100 mL/h via INTRAVENOUS
  Filled 2016-06-19: qty 1000

## 2016-06-19 MED ORDER — METOCLOPRAMIDE HCL 10 MG PO TABS
10.0000 mg | ORAL_TABLET | Freq: Four times a day (QID) | ORAL | Status: DC | PRN
Start: 2016-06-18 — End: 2016-06-21

## 2016-06-19 MED ORDER — BISACODYL 5 MG PO TBEC: 5.0000 mg | DELAYED_RELEASE_TABLET | Freq: Every day | ORAL | Status: DC | PRN

## 2016-06-19 MED ORDER — SODIUM CHLORIDE 0.9% FLUSH
10.0000 mL | INTRAVENOUS | Status: DC | PRN
Start: 2016-06-19 — End: 2016-06-21
  Administered 2016-06-21: 10 mL
  Filled 2016-06-19: qty 40

## 2016-06-19 MED ORDER — SIMVASTATIN 10 MG PO TABS
10.0000 mg | ORAL_TABLET | Freq: Every day | ORAL | Status: DC
  Administered 2016-06-19 – 2016-06-20 (×2): 10 mg via ORAL
  Filled 2016-06-19 (×3): qty 1

## 2016-06-19 MED ORDER — POLYETHYLENE GLYCOL 3350 17 G PO PACK
17.0000 g | PACK | Freq: Every day | ORAL | Status: DC
  Administered 2016-06-19 – 2016-06-21 (×3): 17 g via ORAL
  Filled 2016-06-19 (×3): qty 1

## 2016-06-19 MED ORDER — CEFEPIME HCL 1 G IJ SOLR
1.0000 g | Freq: Every day | INTRAMUSCULAR | Status: DC
  Administered 2016-06-19 – 2016-06-20 (×2): 1 g via INTRAVENOUS
  Filled 2016-06-19 (×4): qty 1

## 2016-06-19 MED ORDER — MAGNESIUM SULFATE 2 GM/50ML IV SOLN
2.0000 g | Freq: Once | INTRAVENOUS | Status: AC
  Administered 2016-06-19: 2 g via INTRAVENOUS
  Filled 2016-06-19: qty 50

## 2016-06-19 MED ORDER — SODIUM CHLORIDE 0.9 % IV BOLUS (SEPSIS)
250.0000 mL | Freq: Once | INTRAVENOUS | Status: AC
  Administered 2016-06-19: 250 mL via INTRAVENOUS

## 2016-06-19 MED ORDER — POTASSIUM CHLORIDE CRYS ER 20 MEQ PO TBCR
40.0000 meq | EXTENDED_RELEASE_TABLET | Freq: Once | ORAL | Status: AC
  Administered 2016-06-19: 40 meq via ORAL
  Filled 2016-06-19: qty 2

## 2016-06-19 MED ORDER — POTASSIUM CHLORIDE CRYS ER 20 MEQ PO TBCR: 40.0000 meq | EXTENDED_RELEASE_TABLET | Freq: Once | ORAL | Status: DC

## 2016-06-19 MED ORDER — MAGNESIUM OXIDE 400 (241.3 MG) MG PO TABS
400.0000 mg | ORAL_TABLET | Freq: Two times a day (BID) | ORAL | Status: DC
  Administered 2016-06-19 – 2016-06-21 (×6): 400 mg via ORAL
  Filled 2016-06-19 (×6): qty 1

## 2016-06-19 MED ORDER — MAGNESIUM HYDROXIDE 400 MG/5ML PO SUSP: 30.0000 mL | Freq: Every day | ORAL | Status: DC | PRN

## 2016-06-19 MED ORDER — BENZONATATE 100 MG PO CAPS: 100.0000 mg | ORAL_CAPSULE | Freq: Three times a day (TID) | ORAL | Status: DC | PRN

## 2016-06-19 MED ORDER — BOOST PLUS PO LIQD
237.0000 mL | Freq: Two times a day (BID) | ORAL | Status: DC
  Administered 2016-06-19 – 2016-06-21 (×5): 237 mL via ORAL
  Filled 2016-06-19 (×6): qty 237

## 2016-06-19 MED ORDER — IPRATROPIUM-ALBUTEROL 0.5-2.5 (3) MG/3ML IN SOLN: 3.0000 mL | RESPIRATORY_TRACT | Status: DC | PRN

## 2016-06-19 MED ORDER — FOLIC ACID 1 MG PO TABS
500.0000 ug | ORAL_TABLET | Freq: Every day | ORAL | Status: DC
  Administered 2016-06-19 – 2016-06-21 (×3): 0.5 mg via ORAL
  Filled 2016-06-19 (×3): qty 1

## 2016-06-19 MED ORDER — SODIUM CHLORIDE 0.9% FLUSH
10.0000 mL | Freq: Two times a day (BID) | INTRAVENOUS | Status: DC
Start: 2016-06-19 — End: 2016-06-21

## 2016-06-19 MED ORDER — SODIUM CHLORIDE 0.9 % IV SOLN
INTRAVENOUS | Status: DC
  Administered 2016-06-19 – 2016-06-20 (×4): via INTRAVENOUS

## 2016-06-19 MED ORDER — ONDANSETRON HCL 4 MG PO TABS: 4.0000 mg | ORAL_TABLET | Freq: Four times a day (QID) | ORAL | Status: DC | PRN

## 2016-06-19 MED ORDER — CALCIUM CARBONATE-VITAMIN D 500-200 MG-UNIT PO TABS
1.0000 | ORAL_TABLET | Freq: Every day | ORAL | Status: DC
  Administered 2016-06-19 – 2016-06-21 (×3): 1 via ORAL
  Filled 2016-06-19 (×3): qty 1

## 2016-06-19 MED ORDER — MORPHINE SULFATE ER 15 MG PO TBCR
15.0000 mg | EXTENDED_RELEASE_TABLET | Freq: Two times a day (BID) | ORAL | Status: DC
  Administered 2016-06-19 – 2016-06-21 (×6): 15 mg via ORAL
  Filled 2016-06-19 (×6): qty 1

## 2016-06-19 MED ORDER — FAMOTIDINE 20 MG PO TABS
20.0000 mg | ORAL_TABLET | Freq: Two times a day (BID) | ORAL | Status: DC
  Administered 2016-06-19 – 2016-06-21 (×6): 20 mg via ORAL
  Filled 2016-06-19 (×6): qty 1

## 2016-06-19 MED ORDER — POTASSIUM CHLORIDE CRYS ER 20 MEQ PO TBCR
20.0000 meq | EXTENDED_RELEASE_TABLET | Freq: Every day | ORAL | Status: DC
  Administered 2016-06-19 – 2016-06-20 (×2): 20 meq via ORAL
  Filled 2016-06-19 (×2): qty 1

## 2016-06-19 MED ORDER — DILTIAZEM HCL ER 240 MG PO CP24
240.0000 mg | ORAL_CAPSULE | Freq: Every evening | ORAL | Status: DC
  Administered 2016-06-19 – 2016-06-20 (×2): 240 mg via ORAL
  Filled 2016-06-19 (×5): qty 1

## 2016-06-19 MED ORDER — PANTOPRAZOLE SODIUM 40 MG PO TBEC
80.0000 mg | DELAYED_RELEASE_TABLET | Freq: Every day | ORAL | Status: DC
  Administered 2016-06-19 – 2016-06-21 (×3): 80 mg via ORAL
  Filled 2016-06-19 (×4): qty 2

## 2016-06-19 MED ORDER — ONDANSETRON HCL 4 MG/2ML IJ SOLN: 4.0000 mg | Freq: Four times a day (QID) | INTRAMUSCULAR | Status: DC | PRN

## 2016-06-19 MED ORDER — METHYLPREDNISOLONE SODIUM SUCC 125 MG IJ SOLR
60.0000 mg | Freq: Four times a day (QID) | INTRAMUSCULAR | Status: DC
  Administered 2016-06-19 – 2016-06-20 (×6): 60 mg via INTRAVENOUS
  Filled 2016-06-19 (×6): qty 2

## 2016-06-19 MED ORDER — SODIUM CHLORIDE 0.9% FLUSH
3.0000 mL | Freq: Two times a day (BID) | INTRAVENOUS | Status: DC
  Administered 2016-06-19 (×2): 3 mL via INTRAVENOUS

## 2016-06-19 MED ORDER — HYDROCODONE-ACETAMINOPHEN 5-325 MG PO TABS
1.0000 | ORAL_TABLET | ORAL | Status: DC | PRN
  Administered 2016-06-20 – 2016-06-21 (×4): 1 via ORAL
  Filled 2016-06-19 (×4): qty 1

## 2016-06-19 MED ORDER — ENOXAPARIN SODIUM 40 MG/0.4ML ~~LOC~~ SOLN
40.0000 mg | SUBCUTANEOUS | Status: DC
  Administered 2016-06-19 – 2016-06-21 (×3): 40 mg via SUBCUTANEOUS
  Filled 2016-06-19 (×3): qty 0.4

## 2016-06-19 MED ORDER — BUDESONIDE 0.25 MG/2ML IN SUSP
0.2500 mg | Freq: Two times a day (BID) | RESPIRATORY_TRACT | Status: DC
Start: 2016-06-19 — End: 2016-06-21
  Administered 2016-06-19 – 2016-06-21 (×4): 0.25 mg via RESPIRATORY_TRACT
  Filled 2016-06-19 (×5): qty 2

## 2016-06-19 MED ORDER — GUAIFENESIN ER 600 MG PO TB12
600.0000 mg | ORAL_TABLET | Freq: Two times a day (BID) | ORAL | Status: DC
  Administered 2016-06-19 – 2016-06-21 (×6): 600 mg via ORAL
  Filled 2016-06-19 (×6): qty 1

## 2016-06-19 MED ORDER — IPRATROPIUM-ALBUTEROL 0.5-2.5 (3) MG/3ML IN SOLN
3.0000 mL | Freq: Two times a day (BID) | RESPIRATORY_TRACT | Status: DC
  Administered 2016-06-20 – 2016-06-21 (×3): 3 mL via RESPIRATORY_TRACT
  Filled 2016-06-19 (×3): qty 3

## 2016-06-19 MED ORDER — ENSURE ENLIVE PO LIQD
237.0000 mL | Freq: Two times a day (BID) | ORAL | Status: DC
  Administered 2016-06-20: 237 mL via ORAL

## 2016-06-19 NOTE — H&P (Signed)
History and Physical    Destiny Harrison GDJ:[id number] DOB: 01-27-1941 DOA: 06/18/2016  PCP: Thressa Sheller, MD   Patient coming from: Home   Chief Complaint: Fevers, SOB, N/V   HPI: Destiny Harrison is a 75 y.o. female with medical history significant for metastatic non-small cell lung cancer, COPD with chronic hypoxic respiratory failure, chronic pain, and GERD who presents the emergency department with 1 day of fevers and worsening dyspnea. Patient had recently been hospitalized from 05/28/2016 - 06/06/2016 for management of chemotherapy-induced enteritis and HCAP. She was discharged home to complete a prednisone taper and had been doing well until the day of her presentation when she developed high fevers, worsening dyspnea, and nausea with a couple episodes of nonbloody nonbilious vomiting (despite chart note that reports no N/V). She denies any production of sputum with the cough and denies any associated chest pain or palpitations. She has had some mild lower extremity edema in the past and has been prescribed a daily Lasix, but this has not been an issue lately. She endorses nausea and nonbloody nonbilious vomiting, but denies abdominal pain, flank pain, or dysuria.   ED Course: Upon arrival to the ED, patient is found to be febrile to 38.2 C, saturating well on 3 L/m nasal cannula, tachycardic in the 130s, and was stable blood pressure. EKG demonstrates a sinus tachycardia with rate 119 and borderline LVH by voltage criteria. Chest x-ray is negative for acute cardiopulmonary disease but reflex COPD and right hilar mass/adenopathy. CMP is notable for a mild hyponatremia and hypochloremia, and serum potassium of 2.8. CBC is notable for a stable normocytic anemia with hemoglobin of 9.3. Lactic acid is reassuring at 1.13 and troponin is negative at 0.01. Urinalysis was obtained and features many bacteria, moderate leukocyte, positive nitrite, and 6-30 WBC. Blood and urine was sent for culture, patient was  given a 30 cc/kg normal saline bolus, and empiric treatment with vancomycin and Zosyn was initiated. 10 mEq of potassium was given IV and symptomatic care was provided with morphine and acetaminophen. Patient's tachycardia improved following the fluid bolus and she enjoyed subjective improvement in her dyspnea following a DuoNeb treatment. She will be admitted to the telemetry unit for ongoing evaluation and management of sepsis suspected secondary to UTI in addition to COPD with acute exacerbation.  Review of Systems:  All other systems reviewed and apart from HPI, are negative.  Past Medical History:  Diagnosis Date  . Asthma   . COPD (chronic obstructive pulmonary disease) ([place])   . DNR (do not resuscitate) 06/26/2015  . Dysphagia 12/07/2015  . Encounter for antineoplastic immunotherapy 05/22/2015  . Hiatal hernia   . History of chemotherapy   . History of radiation therapy 03/06/2012   left hilum  . History of radiation therapy 05/10/2013-05/31/2013   Left lung/ 33/75'@2'$ .25 per fraction x 15 fractions  . Hyperlipidemia   . Neutropenia, drug-induced ([place]) 05/05/2012  . Non-small cell lung cancer ([place]) dx'd 08/28/11   left lung  . Primary cancer of right upper lobe of lung ([place]) 02/06/2012  . Radiation 11/15/13-11/26/13   Right hilum 30 Gy in 10 fractions  . Rheumatoid arthritis(714.0)     Past Surgical History:  Procedure Laterality Date  . appendex  1962  . surgery on right wrist    . VIDEO BRONCHOSCOPY  01/28/2012   Procedure: VIDEO BRONCHOSCOPY WITHOUT FLUORO;  Surgeon: Brand Males, MD;  Location: [facility] ENDOSCOPY;  Service: Endoscopy;  Laterality: Bilateral;     reports that she quit smoking about 6  years ago. Her smoking use included Cigarettes. She has a 20.00 pack-year smoking history. She has never used smokeless tobacco. She reports that she does not drink alcohol or use drugs.  Allergies  Allergen Reactions  . Shellfish Allergy Swelling    Family History  Problem Relation Age  of Onset  . Diabetes Mother     insulin dependent  . Breast cancer Mother      Prior to Admission medications   Medication Sig Start Date End Date Taking? Authorizing Provider  albuterol (PROAIR HFA) 108 (90 BASE) MCG/ACT inhaler Inhale 2 puffs into the lungs every 6 (six) hours as needed for wheezing or shortness of breath. 08/31/15  Yes Brand Males, MD  Ascorbic Acid (VITAMIN C PO) Take 1 tablet by mouth daily.   Yes Historical Provider, MD  benzonatate (TESSALON) 100 MG capsule Take 100 mg by mouth every 8 (eight) hours as needed for cough.  01/05/16  Yes Historical Provider, MD  budesonide (PULMICORT) 0.25 MG/2ML nebulizer solution Take 2 mLs (0.25 mg total) by nebulization 2 (two) times daily. 08/20/15  Yes Belkys A Regalado, MD  calcium-vitamin D (OSCAL WITH D) 500-200 MG-UNIT per tablet Take 1 tablet by mouth daily.   Yes Historical Provider, MD  Cholecalciferol (VITAMIN D PO) Take 1 tablet by mouth daily.   Yes Historical Provider, MD  dexamethasone (DECADRON) 4 MG tablet Take 8 mg by mouth 2 (two) times daily. The day before, of, and the day after chemo 04/09/16  Yes Historical Provider, MD  diltiazem (DILACOR XR) 240 MG 24 hr capsule Take 1 capsule (240 mg total) by mouth every evening. 06/06/16  Yes Lavina Hamman, MD  famotidine (PEPCID) 20 MG tablet Take 1 tablet (20 mg total) by mouth 2 (two) times daily. 06/06/16  Yes Lavina Hamman, MD  folic acid (FOLVITE) 329 MCG tablet Take 400 mcg by mouth daily.   Yes Historical Provider, MD  furosemide (LASIX) 20 MG tablet Take 20 mg by mouth daily.  04/30/16  Yes Historical Provider, MD  guaiFENesin (MUCINEX) 600 MG 12 hr tablet Take 1 tablet (600 mg total) by mouth 2 (two) times daily. 06/27/15  Yes Reyne Dumas, MD  HYDROcodone-acetaminophen (NORCO/VICODIN) 5-325 MG tablet Take 1 tablet by mouth every 4 (four) hours as needed for moderate pain. 06/13/16  Yes Curt Bears, MD  IRON PO Take 1 tablet by mouth daily.   Yes Historical  Provider, MD  lactose free nutrition (BOOST PLUS) LIQD Take 237 mLs by mouth 2 (two) times daily between meals. 06/06/16  Yes Lavina Hamman, MD  magnesium oxide (MAG-OX) 400 (241.3 Mg) MG tablet Take 1 tablet (400 mg total) by mouth 2 (two) times daily. 02/18/16  Yes Maryann Mikhail, DO  metoCLOPramide (REGLAN) 10 MG tablet Take 10 mg by mouth every 6 (six) hours as needed for nausea or vomiting.  03/29/16  Yes Historical Provider, MD  morphine (MS CONTIN) 15 MG 12 hr tablet Take 1 tablet (15 mg total) by mouth every 12 (twelve) hours. 05/23/16  Yes Curt Bears, MD  ondansetron (ZOFRAN-ODT) 8 MG disintegrating tablet DISSOLVE 1 TABLET BY MOUTH EVERY 8 HOURS AS NEEDED FOR NAUSEA AND VOMITING 04/04/16  Yes Curt Bears, MD  pantoprazole (PROTONIX) 40 MG tablet Take 80 mg by mouth daily.  05/28/16  Yes Historical Provider, MD  polyethylene glycol (MIRALAX / GLYCOLAX) packet MX AND DRK 1 PACKET PO QD MIXED WITH 8 OUNCES OF FLUID 02/22/16  Yes Historical Provider, MD  potassium chloride SA (  K-DUR,KLOR-CON) 20 MEQ tablet Take 1 tablet (20 mEq total) by mouth daily. 05/16/16  Yes Wyatt Portela, MD  predniSONE (DELTASONE) 10 MG tablet Take '50mg'$  daily for 3days,Take '40mg'$  daily for 3days,Take '30mg'$  daily for 3days,Take '20mg'$  daily for 3days,Take '10mg'$  daily for 3days, then resume 5 mg daily. 06/06/16  Yes Lavina Hamman, MD  simvastatin (ZOCOR) 10 MG tablet Take 10 mg by mouth daily.   Yes Historical Provider, MD  OXYGEN Inhale 2 L into the lungs continuous.    Historical Provider, MD  predniSONE (DELTASONE) 5 MG tablet Take 5 mg by mouth daily with breakfast.    Historical Provider, MD    Physical Exam: Vitals:   06/18/16 2126 06/18/16 2159 06/18/16 2240 06/18/16 2246  BP: 123/72 134/85 129/80   Pulse: 113 115 114   Resp: '22 19 25   '$ Temp:    99.2 F (37.3 C)  TempSrc:    Oral  SpO2: 99% 100% 100%   Weight:      Height:          Constitutional: NAD, calm, comfortable Eyes: PERTLA, lids and  conjunctivae normal ENMT: Mucous membranes are moist. Posterior pharynx clear of any exudate or lesions.   Neck: normal, supple, no masses, no thyromegaly Respiratory: Diminished bilaterally with coarse rhonchi at b/l mid-lung fields. No accessory muscle use.  Cardiovascular: Rate ~120 and regular. No extremity edema. No significant JVD. Abdomen: No distension, mildly tender throughout without rebound pain or guarding, no masses palpated. Bowel sounds normal.  Musculoskeletal: no clubbing / cyanosis. No joint deformity upper and lower extremities. Normal muscle tone.  Skin: no significant rashes, lesions, ulcers. Warm, dry, well-perfused. Neurologic: CN 2-12 grossly intact. Sensation intact, DTR normal. Strength 5/5 in all 4 limbs.  Psychiatric: Normal judgment and insight. Alert and oriented x 3. Normal mood and affect.     Labs on Admission: I have personally reviewed following labs and imaging studies  CBC:  Recent Labs Lab 06/13/16 1124 06/18/16 2002  WBC 10.1 4.1  NEUTROABS 9.6* 3.3  HGB 8.8* 9.3*  HCT 27.3* 28.2*  MCV 81.0 80.6  PLT 484* 542   Basic Metabolic Panel:  Recent Labs Lab 06/13/16 1124 06/18/16 2002  NA 139 134*  K 3.7 2.8*  CL  --  93*  CO2 28 32  GLUCOSE 151* 86  BUN 19.6 20  CREATININE 0.7 0.68  CALCIUM 9.0 8.1*   GFR: Estimated Creatinine Clearance: 46.7 mL/min (by C-G formula based on SCr of 0.8 mg/dL). Liver Function Tests:  Recent Labs Lab 06/13/16 1124 06/18/16 2002  AST 10 13*  ALT 15 13*  ALKPHOS 92 78  BILITOT 0.35 1.4*  PROT 6.3* 5.9*  ALBUMIN 2.9* 3.3*   No results for input(s): LIPASE, AMYLASE in the last 168 hours. No results for input(s): AMMONIA in the last 168 hours. Coagulation Profile: No results for input(s): INR, PROTIME in the last 168 hours. Cardiac Enzymes: No results for input(s): CKTOTAL, CKMB, CKMBINDEX, TROPONINI in the last 168 hours. BNP (last 3 results) No results for input(s): PROBNP in the last 8760  hours. HbA1C: No results for input(s): HGBA1C in the last 72 hours. CBG: No results for input(s): GLUCAP in the last 168 hours. Lipid Profile: No results for input(s): CHOL, HDL, LDLCALC, TRIG, CHOLHDL, LDLDIRECT in the last 72 hours. Thyroid Function Tests: No results for input(s): TSH, T4TOTAL, FREET4, T3FREE, THYROIDAB in the last 72 hours. Anemia Panel: No results for input(s): VITAMINB12, FOLATE, FERRITIN, TIBC, IRON, RETICCTPCT in  the last 72 hours. Urine analysis:    Component Value Date/Time   COLORURINE YELLOW 06/18/2016 1920   APPEARANCEUR CLOUDY (A) 06/18/2016 1920   LABSPEC 1.011 06/18/2016 1920   PHURINE 8.0 06/18/2016 1920   GLUCOSEU NEGATIVE 06/18/2016 1920   HGBUR NEGATIVE 06/18/2016 1920   BILIRUBINUR NEGATIVE 06/18/2016 1920   KETONESUR NEGATIVE 06/18/2016 1920   PROTEINUR NEGATIVE 06/18/2016 1920   UROBILINOGEN 0.2 08/18/2015 0730   NITRITE POSITIVE (A) 06/18/2016 1920   LEUKOCYTESUR MODERATE (A) 06/18/2016 1920   Sepsis Labs: '@LABRCNTIP'$ (procalcitonin:4,lacticidven:4) )No results found for this or any previous visit (from the past 240 hour(s)).   Radiological Exams on Admission: Dg Chest 2 View  Result Date: 06/18/2016 CLINICAL DATA:  Shortness of breath, LEFT chest pain and fever this morning. History of COPD, lung cancer. EXAM: CHEST  2 VIEW COMPARISON:  COPD and RIGHT hilar lymphadenopathy. No acute cardiopulmonary process. FINDINGS: Cardiac silhouette is normal. Fullness of the RIGHT hila corresponding to known mediastinal lymph nodes. No pleural effusion or focal consolidation. Increased lung volumes consistent with history of COPD. Single lumen RIGHT chest Port-A-Cath with distal tip projecting in proximal superior vena cava. High-riding RIGHT humeral head most compatible with old rotator cuff injury. Broad levoscoliosis. IMPRESSION: COPD and RIGHT hilar mass/lymphadenopathy without acute cardiopulmonary process. Electronically Signed   By: Elon Alas  M.D.   On: 06/18/2016 20:41    EKG: Independently reviewed. Sinus tachycardia (rate 119), borderline LVH   Assessment/Plan  1. Sepsis secondary to UTI  - Presents with fevers, sinus tachycardia, and UA suggestive on infection  - Blood and urine cultures are incubating  - 30 cc/kg bolus of NS was given  - Lactate reassuring at 1.13  - Empiric vancomycin and Zosyn administered in ED  - Source appears to be urinary; pt denies dysuria, but has abd tenderness, N/V - Will treat with empiric cefepime for now as UTI seems most likely; last admit she had PNA noted on CTA, but not CXR and that is certainly possibly here; MRSA screens have been historically negative; will check sputum culture and GS and continue with cefepime monotherapy for now  - Historically, urine culture grew E coli resistant to ampicillin, Cipro, Levaquin, and Bactrim, but sensitive to cephalosporins   2. Metastatic NSCLC  - Followed by Dr. Julien Nordmann of oncology in the outpatient setting  - Has been previously treated with carboplatin, gemcitabine, and palliative radiotherapy - Had been on immunotherapy with nivolumab, but discontinued d/t disease progression  - Currently under treatment with Cyramza and Tazotere q3 weeks, s/p 7 cycles, most recent of which was 06/13/16  - Received pegfilgrastim on 06/15/16 - Ongoing management per oncology    3. COPD with chronic hypoxic respiratory failure and acute exacerbation  - Requires 3 Lpm supplemental oxygen as baseline; currently saturating well with that  - She was discharged from hospital on 8/17 with prednisone taper, took 30 mg on day of admit  - Diminished breath sounds in ED after she had already been treated with DuoNeb and experienced "a lot" of subjective improvement  - Increase systemic steroid to Solu-Medrol 60 mg q6h for now, to be tapered as tolerated  - Continued scheduled Pulmicort, prn DuoNeb  - Continue supplemental oxygen, titrated to maintain sat mid 90's   4.  Hypokalemia - Serum potassium 2.8 on presentation  - Likely secondary to GI losses and Lasix use, and despite K-Dur 20 mEq qD  - 10 mEq IV potassium given in ED, 40 mEq added to IVF  -  Check magnesium level; 2 g IV magnesium given  - Monitor on telemetry  - Repeat chem panel in am    5. Normocytic anemia  - Hgb 9.3 on admission and stable relative to prior CBC's  - Attributed to chemotherapy and managed with pegfilgrastim, last given on 06/15/16  - No s/s of active blood-loss  - Continue folate supplementation   6. GERD  - Stable  - No EGD on file  - Continue current management with BID Pepcid and daily Protonix    7. Chronic pain  - Attributed to OA and cancer, stable  - Continue home regimen of long-acting morphine with Norco prn breakthrough pain    DVT prophylaxis: sq Lovenox  Code Status: DNR  Family Communication: Discussed with patient Disposition Plan: Admit to telemetry Consults called: None Admission status: Inpatient    Vianne Bulls, MD [facility] Pager [phone]  If 7PM-7AM, please contact night-coverage [email or web address] Password [email or web address]  06/19/2016, 12:01 AM

## 2016-06-19 NOTE — Care Management Note (Signed)
Case Management Note  Patient Details  Name: Destiny Harrison MRN: [id number] Date of Birth: 18-Oct-1941  Subjective/Objective: 75 y/o f admitted w/Sepsis. Hx: Lung Ca. From home. Readmit-8/8-8/17-enteritis.                   Action/Plan:d/c plan home.   Expected Discharge Date:                  Expected Discharge Plan:  Home/Self Care  In-House Referral:     Discharge planning Services  CM Consult  Post Acute Care Choice:    Choice offered to:     DME Arranged:    DME Agency:     HH Arranged:    HH Agency:     Status of Service:  In process, will continue to follow  If discussed at Long Length of Stay Meetings, dates discussed:    Additional Comments:  Dessa Phi, RN 06/19/2016, 2:32 PM

## 2016-06-19 NOTE — Progress Notes (Signed)
PROGRESS NOTE                                                                                                                                                                                                             Patient Demographics:    Destiny Harrison, is a 75 y.o. female, DOB - Jan 20, 1941, AOZ:[id number]  Admit date - 06/18/2016   Admitting Physician Vianne Bulls, MD  Outpatient Primary MD for the patient is Thressa Sheller, MD  LOS - 1   Chief Complaint  Patient presents with  . Cancer  . Fever  . Shortness of Breath       Brief Narrative   75 y.o. female with medical history significant for metastatic non-small cell lung cancer, COPD with chronic hypoxic respiratory failure, chronic pain, and GERD who presents the emergency department with 1 day of fevers and worsening dyspnea, Was significant for neutropenia, and UTI.   Subjective:    Kena Limon today has, No headache, No abdominal pain - No Nausea, Complaints of cough, nonproductive, complaints of dyspnea, which is today at baseline, complaints of muscular skeletal chest pain earlier during the day  Assessment  & Plan :    Principal Problem:   Sepsis ([place]) Active Problems:   Primary cancer of right upper lobe of lung (HCC)   UTI (lower urinary tract infection)   COPD exacerbation (HCC)   Chronic pain   Hypokalemia   Chronic respiratory failure with hypoxia (HCC)   Antineoplastic chemotherapy induced anemia   Sepsis secondary to UTI ([place])  Sepsis secondary to UTI  - Presents with fevers, sinus tachycardia, and UA suggestive on infection  - Blood and urine cultures are incubating  - Empiric vancomycin and Zosyn administered in ED  - Source appears to be urinary; pt denies dysuria, but has abd tenderness, N/V - Historically, urine culture grew E coli resistant to ampicillin, Cipro, Levaquin, and Bactrim, but sensitive to cephalosporins  - Tenuous of them giving she is  neutropenic, don't escalate when stable  Metastatic NSCLC  - Followed by Dr. Julien Nordmann of oncology in the outpatient setting  - Continue neutropenic, but she Received pegfilgrastim on 06/15/16 - Discussed with Dr. Inda Merlin  COPD with chronic hypoxic respiratory failure and acute exacerbation  - Requires 3 Lpm supplemental oxygen as baseline; currently saturating well with that  - She  was discharged from hospital on 8/17 with prednisone taper, took 30 mg on day of admit  - Cont Solu-Medrol 60 mg q6h for now, to be tapered as tolerated  - Continued scheduled Pulmicort, prn DuoNeb  - Continue supplemental oxygen, titrated to maintain sat mid 90's   Hypokalemia/hypomagnesemia - Repleting, monitor closely  Prolonged QTC - Patient with sinus tachycardia, most likely due to sepsis, continue with IV fluids, continue to monitor on telemetry, will repeat electrolytes aggressively, repeat EKG in a.m.  Normocytic anemia  - Hgb 9.3 on admission and stable relative to prior CBC's  - Attributed to chemotherapy and managed with pegfilgrastim, last given on 06/15/16  - No s/s of active blood-loss  - Continue folate supplementation   GERD  - Stable  - No EGD on file  - Continue current management with BID Pepcid and daily Protonix    Chronic pain  - Attributed to OA and cancer, stable  - Continue home regimen of long-acting morphine with Norco prn breakthrough pain      Code Status : DNR  Family Communication  : Son at bedside  Disposition Plan  : PT evaluation  Consults  :  None  Procedures  : None  DVT Prophylaxis  :  Lovenox   Lab Results  Component Value Date   PLT 166 06/19/2016    Antibiotics  :    Anti-infectives    Start     Dose/Rate Route Frequency Ordered Stop   06/19/16 2200  vancomycin (VANCOCIN) IVPB 1000 mg/200 mL premix  Status:  Discontinued     1,000 mg 200 mL/hr over 60 Minutes Intravenous Every 24 hours 06/18/16 2118 06/19/16 0000   06/19/16 0600   piperacillin-tazobactam (ZOSYN) IVPB 3.375 g  Status:  Discontinued     3.375 g 12.5 mL/hr over 240 Minutes Intravenous Every 8 hours 06/18/16 2118 06/19/16 0000   06/19/16 0400  ceFEPIme (MAXIPIME) 1 g in dextrose 5 % 50 mL IVPB     1 g 100 mL/hr over 30 Minutes Intravenous Daily 06/19/16 0000     06/18/16 2115  piperacillin-tazobactam (ZOSYN) IVPB 3.375 g     3.375 g 100 mL/hr over 30 Minutes Intravenous  Once 06/18/16 2108 06/18/16 2227   06/18/16 2115  vancomycin (VANCOCIN) IVPB 1000 mg/200 mL premix     1,000 mg 200 mL/hr over 60 Minutes Intravenous  Once 06/18/16 2108 06/18/16 2327        Objective:   Vitals:   06/18/16 2246 06/19/16 0108 06/19/16 0546 06/19/16 1314  BP:  (!) 113/92 129/68 137/65  Pulse:  (!) 124 (!) 125 (!) 128  Resp:  (!) 30 (!) 22 (!) 26  Temp: 99.2 F (37.3 C) 98.4 F (36.9 C) 98.7 F (37.1 C) 99 F (37.2 C)  TempSrc: Oral Oral Oral Oral  SpO2:  99% 97% 100%  Weight:  47.4 kg (104 lb 8 oz) 47.4 kg (104 lb 8 oz)   Height:  '5\' 5"'$  (1.651 m)      Wt Readings from Last 3 Encounters:  06/19/16 47.4 kg (104 lb 8 oz)  06/13/16 50.4 kg (111 lb 1.6 oz)  05/28/16 48.2 kg (106 lb 4.8 oz)     Intake/Output Summary (Last 24 hours) at 06/19/16 1552 Last data filed at 06/19/16 1029  Gross per 24 hour  Intake           713.33 ml  Output              800 ml  Net           -86.67 ml     Physical Exam  Awake Alert, Oriented X 3 Supple Neck,No JVD,Symmetrical Chest wall movement, Good air movement bilaterally,  RRR,No GallopsTachycardic, muscular skeletal chest pain on palpation  +ve B.Sounds, Abd Soft, No tenderness, No organomegaly appriciated, No rebound - guarding or rigidity. No Cyanosis, Clubbing or edema, No new Rash or bruise      Data Review:    CBC  Recent Labs Lab 06/13/16 1124 06/18/16 2002 06/19/16 0625  WBC 10.1 4.1 1.5*  HGB 8.8* 9.3* 8.9*  HCT 27.3* 28.2* 27.4*  PLT 484* 202 166  MCV 81.0 80.6 79.2  MCH 26.1 26.6 25.7*    MCHC 32.2 33.0 32.5  RDW 19.9* 20.1* 19.6*  LYMPHSABS 0.2* 0.5* 0.2*  MONOABS 0.3 0.3 0.2  EOSABS 0.0 0.0 0.0  BASOSABS 0.0 0.0 0.0    Chemistries   Recent Labs Lab 06/13/16 1124 06/18/16 2002 06/19/16 0012 06/19/16 0625  NA 139 134*  --  134*  K 3.7 2.8*  --  3.3*  CL  --  93*  --  96*  CO2 28 32  --  30  GLUCOSE 151* 86  --  103*  BUN 19.6 20  --  15  CREATININE 0.7 0.68  --  0.62  CALCIUM 9.0 8.1*  --  7.7*  MG  --   --  1.0*  --   AST 10 13*  --   --   ALT 15 13*  --   --   ALKPHOS 92 78  --   --   BILITOT 0.35 1.4*  --   --    ------------------------------------------------------------------------------------------------------------------ No results for input(s): CHOL, HDL, LDLCALC, TRIG, CHOLHDL, LDLDIRECT in the last 72 hours.  Lab Results  Component Value Date   HGBA1C 6.6 (H) 02/13/2016   ------------------------------------------------------------------------------------------------------------------ No results for input(s): TSH, T4TOTAL, T3FREE, THYROIDAB in the last 72 hours.  Invalid input(s): FREET3 ------------------------------------------------------------------------------------------------------------------ No results for input(s): VITAMINB12, FOLATE, FERRITIN, TIBC, IRON, RETICCTPCT in the last 72 hours.  Coagulation profile  Recent Labs Lab 06/19/16 0012  INR 1.06    No results for input(s): DDIMER in the last 72 hours.  Cardiac Enzymes No results for input(s): CKMB, TROPONINI, MYOGLOBIN in the last 168 hours.  Invalid input(s): CK ------------------------------------------------------------------------------------------------------------------    Component Value Date/Time   BNP 183.6 (H) 03/26/2016 1242    Inpatient Medications  Scheduled Meds: . budesonide  0.25 mg Nebulization BID  . calcium-vitamin D  1 tablet Oral Daily  . ceFEPime (MAXIPIME) IV  1 g Intravenous Daily  . diltiazem  240 mg Oral QPM  . enoxaparin  (LOVENOX) injection  40 mg Subcutaneous Q24H  . famotidine  20 mg Oral BID  . folic acid  034 mcg Oral Daily  . guaiFENesin  600 mg Oral BID  . [START ON 06/20/2016] ipratropium-albuterol  3 mL Nebulization BID  . lactose free nutrition  237 mL Oral BID BM  . magnesium oxide  400 mg Oral BID  . magnesium sulfate 1 - 4 g bolus IVPB  2 g Intravenous Once  . methylPREDNISolone (SOLU-MEDROL) injection  60 mg Intravenous Q6H  . morphine  15 mg Oral Q12H  . pantoprazole  80 mg Oral Daily  . polyethylene glycol  17 g Oral Daily  . potassium chloride SA  20 mEq Oral Daily  . potassium chloride  40 mEq Oral Once  . simvastatin  10 mg Oral q1800  .  sodium chloride flush  10-40 mL Intracatheter Q12H  . sodium chloride flush  3 mL Intravenous Q12H   Continuous Infusions:  PRN Meds:.benzonatate, bisacodyl, HYDROcodone-acetaminophen, ipratropium-albuterol, magnesium hydroxide, metoCLOPramide, sodium chloride flush  Micro Results Recent Results (from the past 240 hour(s))  Culture, blood (Routine X 2)     Status: None (Preliminary result)   Collection Time: 06/18/16  8:07 PM  Result Value Ref Range Status   Specimen Description BLOOD PORTA CATH RIGHT  Final   Special Requests BOTTLES DRAWN AEROBIC AND ANAEROBIC 5 CC EACH  Final   Culture   Final    NO GROWTH < 24 HOURS Performed at [facility]    Report Status PENDING  Incomplete  Culture, blood (Routine X 2)     Status: None (Preliminary result)   Collection Time: 06/18/16  8:11 PM  Result Value Ref Range Status   Specimen Description BLOOD LEFT ANTECUBITAL  Final   Special Requests BOTTLES DRAWN AEROBIC AND ANAEROBIC 5 CC EACH  Final   Culture   Final    NO GROWTH < 24 HOURS Performed at [facility]    Report Status PENDING  Incomplete  MRSA PCR Screening     Status: None   Collection Time: 06/19/16 12:57 AM  Result Value Ref Range Status   MRSA by PCR NEGATIVE NEGATIVE Final    Comment:        The GeneXpert MRSA  Assay (FDA approved for NASAL specimens only), is one component of a comprehensive MRSA colonization surveillance program. It is not intended to diagnose MRSA infection nor to guide or monitor treatment for MRSA infections.     Radiology Reports Dg Chest 2 View  Result Date: 06/18/2016 CLINICAL DATA:  Shortness of breath, LEFT chest pain and fever this morning. History of COPD, lung cancer. EXAM: CHEST  2 VIEW COMPARISON:  COPD and RIGHT hilar lymphadenopathy. No acute cardiopulmonary process. FINDINGS: Cardiac silhouette is normal. Fullness of the RIGHT hila corresponding to known mediastinal lymph nodes. No pleural effusion or focal consolidation. Increased lung volumes consistent with history of COPD. Single lumen RIGHT chest Port-A-Cath with distal tip projecting in proximal superior vena cava. High-riding RIGHT humeral head most compatible with old rotator cuff injury. Broad levoscoliosis. IMPRESSION: COPD and RIGHT hilar mass/lymphadenopathy without acute cardiopulmonary process. Electronically Signed   By: Elon Alas M.D.   On: 06/18/2016 20:41   Dg Chest 2 View  Result Date: 05/28/2016 CLINICAL DATA:  SOB increasing x 2 days - currently being treated for lung cancer - hx COPD - hx asthma EXAM: CHEST  2 VIEW COMPARISON:  05/07/2016 FINDINGS: The cardiac silhouette is normal in size. No mediastinal masses or convincing adenopathy. Coarse reticular type opacity extends from superior left hilum. There is fullness from the inferior right hilum. These findings stable. Lungs are hyperexpanded but otherwise clear. No pleural effusion or pneumothorax. Right anterior chest wall Port-A-Cath is stable. Bony thorax is demineralized. Mild compression fracture of a mid to upper thoracic vertebra, also stable. No convincing osteoblastic or osteolytic lesions. IMPRESSION: No acute cardiopulmonary disease. No significant change from the previous chest radiograph. Electronically Signed   By: Lajean Manes M.D.   On: 05/28/2016 14:49   Ct Chest W Contrast  Result Date: 06/11/2016 CLINICAL DATA:  Followup lung cancer. EXAM: CT CHEST, ABDOMEN, AND PELVIS WITH CONTRAST TECHNIQUE: Multidetector CT imaging of the chest, abdomen and pelvis was performed following the standard protocol during bolus administration of intravenous contrast. CONTRAST:  153m  ISOVUE-300 IOPAMIDOL (ISOVUE-300) INJECTION 61% COMPARISON:  04/16/2016 FINDINGS: CT CHEST FINDINGS Cardiovascular: The heart size is normal. No pericardial effusion. There is aortic atherosclerosis. Calcification within the LAD, and left circumflex coronary artery noted. Mediastinum/Nodes: The trachea appears patent and is midline. Unremarkable appearance of the esophagus. Partially calcified anterior mediastinal, right paratracheal mass measures 3.4 x 3.4 cm, image 12 of series 5. This is compared with 3.9 x 3.7 cm previously. Sub- carinal lymph node measures 7 mm, image 24 of series 5. On the previous exam this measured 10 mm. Lungs/Pleura: No pleural effusion. Advanced changes of centrilobular and paraseptal emphysema identified. Paramediastinal radiation change within the left lung is unchanged from previous exam. The perihilar right lung lesion measures 1.8 x 1.5 cm, image 30 of series 5. On the previous exam this measured 1.7 x 1.5 cm. Tiny nodule in the right lower lobe is unchanged measuring 2 mm, image 84 of series 4. Musculoskeletal: No aggressive lytic or sclerotic bone lesions identified. T4 compression deformity is stable from previous exam. CT ABDOMEN PELVIS FINDINGS Hepatobiliary: Stable hypervascular structure within the medial segment of left lobe of liver measuring 1 cm, image 59 of series 5. Stable 5 mm adjacent hypervascular lesion, image number 62 of series 5. No intrahepatic bile duct dilatation. The gallbladder appears normal. Pancreas: No inflammation or mass identified. Spleen: The spleen appears normal. Adrenals/Urinary Tract: The  adrenal glands are normal. Unremarkable appearance of the right kidney. There is scarring involving the inferior pole of the right kidney. Urinary bladder is unremarkable. Stomach/Bowel: The stomach is normal. The small bowel loops have a normal course and caliber. The colon is negative. Vascular/Lymphatic: Calcified atherosclerotic disease involves the abdominal aorta. No aneurysm. No enlarged retroperitoneal or mesenteric adenopathy. No enlarged pelvic or inguinal lymph nodes. Reproductive: The uterus and adnexal structures are unremarkable for patient's age. Other: There is no ascites or focal fluid collections within the abdomen or pelvis. Musculoskeletal: There is degenerative disc disease noted within the lumbar spine. 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 IMPRESSION: 1. No new or progressive metastatic disease. 2. Large anterior mediastinal right paratracheal lymph node is mildly decreased in size in the interval. There has been decrease in size of the sub- carinal lymph node. 3. Right perihilar soft tissue mass is stable to minimally increased in size in the interval. 4. Emphysema 5. Aortic atherosclerosis and coronary artery calcifications 6. Emphysema 7. Stable appearance of chronic T4 compression deformity and pagetoid disease involving the right femur. Electronically Signed   By: Kerby Moors M.D.   On: 06/11/2016 08:55   Ct Angio Chest Pe W Or Wo Contrast  Result Date: 06/01/2016 CLINICAL DATA:  Hemoptysis EXAM: CT ANGIOGRAPHY CHEST WITH CONTRAST TECHNIQUE: Multidetector CT imaging of the chest was performed using the standard protocol during bolus administration of intravenous contrast. Multiplanar CT image reconstructions and MIPs were obtained to evaluate the vascular anatomy. CONTRAST:  100 cc Isovue 370 COMPARISON:  02/12/2016 FINDINGS: There are no filling defects in the pulmonary arterial tree to suggest acute pulmonary thromboembolism. Right peritracheal mass in the upper  mediastinum measures 3.2 x 3.1 cm and previously measured 6.4 x 4.5 cm. Is improved. Ill-defined calcifications persist within the mass. Post radiation changes in the medial left lung are stable. Soft tissue prominence in the medial right middle lobe have improved. A focal ground-glass opacity in the lateral basal right lower lobe on image 63 measures 10 mm. Small pleural effusions have developed. Bilateral dependent atelectasis. Partial solid nodule in  the left lower lobe measures 5 mm on image 49. There is fluid or mucus material in left lower lobe airways extending to the posterior basal segment. See image 49. Severe emphysema persists. Interlobular septa are more prominent suggesting interstitial edema or pneumonia. Biapical scarring is stable. No pneumothorax. Right jugular Port-A-Cath is stable. Right ventricle is dilated. Diffuse hepatic steatosis. Calcified granuloma in the right lobe of the liver. Tiny hypodensities in the lateral spleen are nonspecific. Mid-level thoracic compression deformities are stable. This includes T4, T5, and probably T6. Review of the MIP images confirms the above findings. IMPRESSION: No evidence of acute pulmonary thromboembolism New small bilateral pleural effusions with dependent atelectasis New sub cm part solid left lower lobe nodule. New 10 mm ground-glass opacity in the right lower lobe. Central right middle lobe mass and large right paratracheal mass are improved. New interstitial changes suggesting interstitial edema or pneumonia. Electronically Signed   By: Marybelle Killings M.D.   On: 06/01/2016 09:49   Ct Abdomen Pelvis W Contrast  Result Date: 06/11/2016 CLINICAL DATA:  Followup lung cancer. EXAM: CT CHEST, ABDOMEN, AND PELVIS WITH CONTRAST TECHNIQUE: Multidetector CT imaging of the chest, abdomen and pelvis was performed following the standard protocol during bolus administration of intravenous contrast. CONTRAST:  169m ISOVUE-300 IOPAMIDOL (ISOVUE-300) INJECTION 61%  COMPARISON:  04/16/2016 FINDINGS: CT CHEST FINDINGS Cardiovascular: The heart size is normal. No pericardial effusion. There is aortic atherosclerosis. Calcification within the LAD, and left circumflex coronary artery noted. Mediastinum/Nodes: The trachea appears patent and is midline. Unremarkable appearance of the esophagus. Partially calcified anterior mediastinal, right paratracheal mass measures 3.4 x 3.4 cm, image 12 of series 5. This is compared with 3.9 x 3.7 cm previously. Sub- carinal lymph node measures 7 mm, image 24 of series 5. On the previous exam this measured 10 mm. Lungs/Pleura: No pleural effusion. Advanced changes of centrilobular and paraseptal emphysema identified. Paramediastinal radiation change within the left lung is unchanged from previous exam. The perihilar right lung lesion measures 1.8 x 1.5 cm, image 30 of series 5. On the previous exam this measured 1.7 x 1.5 cm. Tiny nodule in the right lower lobe is unchanged measuring 2 mm, image 84 of series 4. Musculoskeletal: No aggressive lytic or sclerotic bone lesions identified. T4 compression deformity is stable from previous exam. CT ABDOMEN PELVIS FINDINGS Hepatobiliary: Stable hypervascular structure within the medial segment of left lobe of liver measuring 1 cm, image 59 of series 5. Stable 5 mm adjacent hypervascular lesion, image number 62 of series 5. No intrahepatic bile duct dilatation. The gallbladder appears normal. Pancreas: No inflammation or mass identified. Spleen: The spleen appears normal. Adrenals/Urinary Tract: The adrenal glands are normal. Unremarkable appearance of the right kidney. There is scarring involving the inferior pole of the right kidney. Urinary bladder is unremarkable. Stomach/Bowel: The stomach is normal. The small bowel loops have a normal course and caliber. The colon is negative. Vascular/Lymphatic: Calcified atherosclerotic disease involves the abdominal aorta. No aneurysm. No enlarged retroperitoneal  or mesenteric adenopathy. No enlarged pelvic or inguinal lymph nodes. Reproductive: The uterus and adnexal structures are unremarkable for patient's age. Other: There is no ascites or focal fluid collections within the abdomen or pelvis. Musculoskeletal: There is degenerative disc disease noted within the lumbar spine. 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 IMPRESSION: 1. No new or progressive metastatic disease. 2. Large anterior mediastinal right paratracheal lymph node is mildly decreased in size in the interval. There has been decrease in size of the  sub- carinal lymph node. 3. Right perihilar soft tissue mass is stable to minimally increased in size in the interval. 4. Emphysema 5. Aortic atherosclerosis and coronary artery calcifications 6. Emphysema 7. Stable appearance of chronic T4 compression deformity and pagetoid disease involving the right femur. Electronically Signed   By: Kerby Moors M.D.   On: 06/11/2016 08:55   Dg Chest Port 1 View  Result Date: 05/31/2016 CLINICAL DATA:  Lung cancer, dyspnea EXAM: PORTABLE CHEST 1 VIEW COMPARISON:  05/28/2016 FINDINGS: Cardiomediastinal silhouette is stable. Hyperinflation again noted. Again noted right paratracheal adenopathy. Stable left perihilar postradiation changes. No definite superimposed infiltrate or pulmonary edema. Mild perihilar bronchitic changes. Right IJ Port-A-Cath is unchanged in position. IMPRESSION: Hyperinflation again noted. Again noted right paratracheal adenopathy. Stable left perihilar postradiation changes. No definite superimposed infiltrate or pulmonary edema. Mild perihilar bronchitic changes. Electronically Signed   By: Lahoma Crocker M.D.   On: 05/31/2016 10:12     Linah Klapper M.D on 06/19/2016 at 3:52 PM  Between 7am to 7pm - Pager - [phone]  After 7pm go to [email or web address] - password [facility]  [facility] -  Office  [phone]

## 2016-06-19 NOTE — Progress Notes (Signed)
Initial Nutrition Assessment  DOCUMENTATION CODES:   Severe malnutrition in context of chronic illness, Severe malnutrition in context of acute illness/injury, Underweight  INTERVENTION:  - Continue Boost Plus BID, each supplement provides 360 kcals and 14 grams of protein. - Continue to encourage PO intakes.  - RD will continue to monitor for additional nutrition-related needs.  NUTRITION DIAGNOSIS:   Increased nutrient needs related to catabolic illness, cancer and cancer related treatments as evidenced by estimated needs.  GOAL:   Patient will meet greater than or equal to 90% of their needs  MONITOR:   PO intake, Supplement acceptance, Weight trends, Labs, Skin, I & O's  REASON FOR ASSESSMENT:   Malnutrition Screening Tool  ASSESSMENT:   75 y.o. female with medical history significant for metastatic non-small cell lung cancer, COPD with chronic hypoxic respiratory failure, chronic pain, and GERD who presents the emergency department with 1 day of fevers and worsening dyspnea. Patient had recently been hospitalized from 05/28/2016 - 06/06/2016 for management of chemotherapy-induced enteritis and HCAP. She was discharged home to complete a prednisone taper and had been doing well until the day of her presentation when she developed high fevers, worsening dyspnea, and nausea with a couple episodes of nonbloody nonbilious vomiting (despite chart note that reports no N/V).   Pt seen for MST. BMI indicates underweight status. Pt was d/c'ed ~2 weeks ago and was seen by this RD several times during that admission. Pt was wheezing and increasingly SOB when talking during visit this afternoon. She had taken a few bites each of banana pudding and soup. She states she plans to drink the rest of the Boost that is in front of her and asked RD to pour a cup of cold water she also plans to drink.   Pt states that since d/c she has been eating well overall and that she is no longer experiencing  taste alteration. She was also drinking Boost PTA, usually BID. Unable to obtain further information at this time d/t breathing difficulty for pt. Notes indicate last chemo was on 06/13/16.  Physical assessment shows moderate and severe muscle wasting, severe fat wasting. Per chart review, pt has lost 7 lbs (6% body weight) in the past 1 week which is significant for time frame; will continue to monitor weight trends. Please allow pt to have more than 2 Boost Plus per day if she desires.   Medications reviewed; 1 tablet Oscal with D/day, 0.5 mg folic acid/day, PRN MOM, 400 mg Mag-Ox BID, 2 g IV Mg sulfate x1 dose today, 60 mg IV Solu-medrol QID, PRN Reglan, PRN Zofran, 80 mg Protonix/day, 17 g Miralax/day, 10 mEq IV KCl x1 dose yesterday, 20 mEq oral KCl/day.   Labs reviewed; Na: 134 mmol/L, K: 3.3 mmol/L, Cl: 96 mmol/L, Ca: 7.7 mg/dL, Mg: 1 mg/dL.    Diet Order:  Diet regular Room service appropriate? Yes; Fluid consistency: Thin  Skin:  Wound (see comment) (Stage 2 bilateral buttocks and sacral pressure injuries)  Last BM:  PTA  Height:   Ht Readings from Last 1 Encounters:  06/19/16 '5\' 5"'$  (1.651 m)    Weight:   Wt Readings from Last 1 Encounters:  06/19/16 104 lb 8 oz (47.4 kg)    Ideal Body Weight:  56.82 kg  BMI:  Body mass index is 17.39 kg/m.  Estimated Nutritional Needs:   Kcal:  1425-1660 (30-35 kcal/kg)  Protein:  65-75 grams (1.4-1.6 grams/kg)  Fluid:  1.4-1.6 L/day  EDUCATION NEEDS:   No education needs  identified at this time   Jarome Matin, MS, RD, LDN Inpatient Clinical Dietitian Pager # [phone] After hours/weekend pager # [phone]

## 2016-06-19 NOTE — Progress Notes (Signed)
Patient complained of chest pain under left breast. Described as "throbbing" pain. Skin warm and dry, vital signs BP 139/64  P 132 Resp 28 O2 sat 100 on 2 l/min O2. 12-Lead EKG obtained. MD notified. Awaiting further orders. Eulas Post, RN

## 2016-06-19 NOTE — Progress Notes (Signed)
Pharmacy Antibiotic Note  Destiny Harrison is a 75 y.o. female admitted on 06/18/2016 with sepsis.  Code Sepsis initiated and Vancomycin 1gm and Zosyn 3.375gm IV x 1 dose each ordered in ED.  PMH includes lung cancer, undergoing chemotherapy, and presents with fever and shortness of breath.  Patient recently admitted for enteritis (discharged 05/28/16). Pharmacy has been consulted for Cefepime for UTI.  Plan: Cefepime 1Gm IV q24h F/U cultures Follow renal function    Height: '5\' 5"'$  (165.1 cm) Weight: 104 lb 8 oz (47.4 kg) IBW/kg (Calculated) : 57  Temp (24hrs), Avg:99.4 F (37.4 C), Min:98.4 F (36.9 C), Max:100.7 F (38.2 C)   Recent Labs Lab 06/13/16 1124 06/18/16 2002 06/18/16 2020 06/19/16 0002  WBC 10.1 4.1  --   --   CREATININE 0.7 0.68  --   --   LATICACIDVEN  --   --  1.13 0.69    Estimated Creatinine Clearance: 44.8 mL/min (by C-G formula based on SCr of 0.8 mg/dL).    Allergies  Allergen Reactions  . Shellfish Allergy Swelling    Antimicrobials this admission: 8/29 Vanc >>  8/30 8/29 Zosyn >>  8/30 8/30 Cefepime >>  Dose adjustments this admission:    Microbiology results: 8/29 BCx: sent 8/29 UCx: sent   Thank you for allowing pharmacy to be a part of this patient's care.  Dorrene German, PharmD 06/19/2016 2:00 AM

## 2016-06-20 ENCOUNTER — Other Ambulatory Visit: Payer: Self-pay | Admitting: *Deleted

## 2016-06-20 ENCOUNTER — Other Ambulatory Visit: Payer: Medicare Other

## 2016-06-20 LAB — CBC WITH DIFFERENTIAL/PLATELET
BASOS PCT: 0 %
Basophils Absolute: 0 10*3/uL (ref 0.0–0.1)
EOS PCT: 0 %
Eosinophils Absolute: 0 10*3/uL (ref 0.0–0.7)
HEMATOCRIT: 25.6 % — AB (ref 36.0–46.0)
Hemoglobin: 8.4 g/dL — ABNORMAL LOW (ref 12.0–15.0)
LYMPHS ABS: 0.3 10*3/uL — AB (ref 0.7–4.0)
Lymphocytes Relative: 4 %
MCH: 26.2 pg (ref 26.0–34.0)
MCHC: 32.8 g/dL (ref 30.0–36.0)
MCV: 79.8 fL (ref 78.0–100.0)
MONO ABS: 1 10*3/uL (ref 0.1–1.0)
Monocytes Relative: 16 %
Neutro Abs: 5 10*3/uL (ref 1.7–7.7)
Neutrophils Relative %: 80 %
Platelets: 163 10*3/uL (ref 150–400)
RBC: 3.21 MIL/uL — ABNORMAL LOW (ref 3.87–5.11)
RDW: 19.8 % — AB (ref 11.5–15.5)
WBC: 6.3 10*3/uL (ref 4.0–10.5)

## 2016-06-20 LAB — BASIC METABOLIC PANEL
Anion gap: 4 — ABNORMAL LOW (ref 5–15)
BUN: 17 mg/dL (ref 6–20)
CALCIUM: 8.2 mg/dL — AB (ref 8.9–10.3)
CO2: 31 mmol/L (ref 22–32)
CREATININE: 0.57 mg/dL (ref 0.44–1.00)
Chloride: 104 mmol/L (ref 101–111)
GFR calc Af Amer: >60 mL/min (ref >60)
GFR calc non Af Amer: >60 mL/min (ref >60)
GLUCOSE: 152 mg/dL — AB (ref 65–99)
Potassium: 4.8 mmol/L (ref 3.5–5.1)
Sodium: 139 mmol/L (ref 135–145)

## 2016-06-20 LAB — MAGNESIUM: Magnesium: 2.4 mg/dL (ref 1.7–2.4)

## 2016-06-20 LAB — URINE CULTURE

## 2016-06-20 LAB — PHOSPHORUS: Phosphorus: 2.5 mg/dL (ref 2.5–4.6)

## 2016-06-20 MED ORDER — METHYLPREDNISOLONE SODIUM SUCC 125 MG IJ SOLR
60.0000 mg | Freq: Two times a day (BID) | INTRAMUSCULAR | Status: DC
  Administered 2016-06-20 – 2016-06-21 (×2): 60 mg via INTRAVENOUS
  Filled 2016-06-20 (×2): qty 2

## 2016-06-20 MED ORDER — VITAMINS A & D EX OINT
TOPICAL_OINTMENT | CUTANEOUS | Status: AC
  Administered 2016-06-20: 12:00:00
  Filled 2016-06-20: qty 5

## 2016-06-20 NOTE — Consult Note (Signed)
   [facility] Inpatient Consult   06/20/2016  Destiny Harrison 07-12-1941 [id number]   [facility] Care Management referral received. Spoke with inpatient RNCM. Went to bedside. [name] currently undergoing a bath. Will come back at later time.   Marthenia Rolling, MSN-Ed, RN,BSN [facility] Liaison [phone]

## 2016-06-20 NOTE — Progress Notes (Addendum)
PROGRESS NOTE                                                                                                                                                                                                             Patient Demographics:    Destiny Harrison, is a 75 y.o. female, DOB - 1940/12/02, WUJ:[id number]  Admit date - 06/18/2016   Admitting Physician Vianne Bulls, MD  Outpatient Primary MD for the patient is Thressa Sheller, MD  LOS - 2   Chief Complaint  Patient presents with  . Cancer  . Fever  . Shortness of Breath       Brief Narrative   75 y.o. female with medical history significant for metastatic non-small cell lung cancer, COPD with chronic hypoxic respiratory failure, chronic pain, and GERD who presents the emergency department with 1 day of fevers and worsening dyspnea, Was significant for neutropenia, and UTI.   Subjective:    Destiny Harrison today has, No headache, No abdominal pain - No Nausea, Complaints of cough, nonproductive, complaints of dyspnea, which is today at baseline  Assessment  & Plan :    Principal Problem:   Sepsis ([place]) Active Problems:   Primary cancer of right upper lobe of lung (HCC)   UTI (lower urinary tract infection)   COPD exacerbation (HCC)   Chronic pain   Hypokalemia   Chronic respiratory failure with hypoxia (HCC)   Antineoplastic chemotherapy induced anemia   Sepsis secondary to UTI ([place])   Pressure ulcer  Sepsis secondary to UTI  - Presents with fevers, sinus tachycardia, and UA suggestive on infection  - Source appears to be urinary; pt denies dysuria, but has abd tenderness, N/V - Historically, urine culture grew E coli resistant to ampicillin, Cipro, Levaquin, and Bactrim, but sensitive to cephalosporins  - Continue with IV cefepime, unfortunately culture are unhelpful, growing multiple species  Metastatic NSCLC  - Followed by Dr. Julien Nordmann of oncology in the outpatient setting  -  Continue neutropenic, but she Received pegfilgrastim on 06/15/16 - Discussed with Dr. Inda Merlin  COPD with chronic hypoxic respiratory failure and acute exacerbation  - Requires 3 Lpm supplemental oxygen as baseline; currently saturating well with that  - She was discharged from hospital on 8/17 with prednisone taper, took 30 mg on day of admit  - Improving her respiratory status today,  will taper her Solu-Medrol - Continued scheduled Pulmicort, prn DuoNeb  - Continue supplemental oxygen, titrated to maintain sat mid 90's   Hypokalemia/hypomagnesemia - Repleted, potassium 4.8 today, will hold potassium supplement  Prolonged QTC - EKG yesterday with QTC of 583, repeat today showed QTC of 503, significantly improved after repleting electrolytes  Normocytic anemia  - Attributed to chemotherapy and managed with pegfilgrastim, last given on 06/15/16  - No s/s of active blood-loss  - Continue folate supplementation  - Transfuse as needed  GERD  - Stable  - No EGD on file  - Continue current management with BID Pepcid and daily Protonix    Chronic pain  - Attributed to OA and cancer, stable  - Continue home regimen of long-acting morphine with Norco prn breakthrough pain   Protein calorie malnutrition - We'll start her on supplements   Code Status : DNR  Family Communication  : Husband at bedside  Disposition Plan  : PT evaluation pending, will likely discharge in 1-2 days  Consults  :  None  Procedures  : None  DVT Prophylaxis  :  Lovenox   Lab Results  Component Value Date   PLT 163 06/20/2016    Antibiotics  :    Anti-infectives    Start     Dose/Rate Route Frequency Ordered Stop   06/19/16 2200  vancomycin (VANCOCIN) IVPB 1000 mg/200 mL premix  Status:  Discontinued     1,000 mg 200 mL/hr over 60 Minutes Intravenous Every 24 hours 06/18/16 2118 06/19/16 0000   06/19/16 0600  piperacillin-tazobactam (ZOSYN) IVPB 3.375 g  Status:  Discontinued     3.375  g 12.5 mL/hr over 240 Minutes Intravenous Every 8 hours 06/18/16 2118 06/19/16 0000   06/19/16 0400  ceFEPIme (MAXIPIME) 1 g in dextrose 5 % 50 mL IVPB     1 g 100 mL/hr over 30 Minutes Intravenous Daily 06/19/16 0000     06/18/16 2115  piperacillin-tazobactam (ZOSYN) IVPB 3.375 g     3.375 g 100 mL/hr over 30 Minutes Intravenous  Once 06/18/16 2108 06/18/16 2227   06/18/16 2115  vancomycin (VANCOCIN) IVPB 1000 mg/200 mL premix     1,000 mg 200 mL/hr over 60 Minutes Intravenous  Once 06/18/16 2108 06/18/16 2327        Objective:   Vitals:   06/19/16 2153 06/20/16 0605 06/20/16 0715 06/20/16 0900  BP: (!) 112/59 106/80 (!) 108/52   Pulse: (!) 104 91 94 (!) 105  Resp: '16 18  18  '$ Temp: 98 F (36.7 C) 97.8 F (36.6 C) 97.5 F (36.4 C)   TempSrc: Oral Oral Oral   SpO2: 99% 100%  100%  Weight:  48.2 kg (106 lb 4.2 oz)    Height:        Wt Readings from Last 3 Encounters:  06/20/16 48.2 kg (106 lb 4.2 oz)  06/13/16 50.4 kg (111 lb 1.6 oz)  05/28/16 48.2 kg (106 lb 4.8 oz)     Intake/Output Summary (Last 24 hours) at 06/20/16 1127 Last data filed at 06/20/16 0640  Gross per 24 hour  Intake             1100 ml  Output              975 ml  Net              125 ml     Physical Exam  Awake Alert, Oriented X 3 Supple Neck,No JVD,Symmetrical Chest wall movement, Good  air movement bilaterally,  RRR,No Gallops +ve B.Sounds, Abd Soft, No tenderness, No organomegaly appriciated, No rebound - guarding or rigidity. No Cyanosis, Clubbing or edema, No new Rash or bruise      Data Review:    CBC  Recent Labs Lab 06/18/16 2002 06/19/16 0625 06/20/16 0510  WBC 4.1 1.5* 6.3  HGB 9.3* 8.9* 8.4*  HCT 28.2* 27.4* 25.6*  PLT 202 166 163  MCV 80.6 79.2 79.8  MCH 26.6 25.7* 26.2  MCHC 33.0 32.5 32.8  RDW 20.1* 19.6* 19.8*  LYMPHSABS 0.5* 0.2* 0.3*  MONOABS 0.3 0.2 1.0  EOSABS 0.0 0.0 0.0  BASOSABS 0.0 0.0 0.0    Chemistries   Recent Labs Lab 06/18/16 2002  06/19/16 0012 06/19/16 0625 06/20/16 0510  NA 134*  --  134* 139  K 2.8*  --  3.3* 4.8  CL 93*  --  96* 104  CO2 32  --  30 31  GLUCOSE 86  --  103* 152*  BUN 20  --  15 17  CREATININE 0.68  --  0.62 0.57  CALCIUM 8.1*  --  7.7* 8.2*  MG  --  1.0*  --  2.4  AST 13*  --   --   --   ALT 13*  --   --   --   ALKPHOS 78  --   --   --   BILITOT 1.4*  --   --   --    ------------------------------------------------------------------------------------------------------------------ No results for input(s): CHOL, HDL, LDLCALC, TRIG, CHOLHDL, LDLDIRECT in the last 72 hours.  Lab Results  Component Value Date   HGBA1C 6.6 (H) 02/13/2016   ------------------------------------------------------------------------------------------------------------------ No results for input(s): TSH, T4TOTAL, T3FREE, THYROIDAB in the last 72 hours.  Invalid input(s): FREET3 ------------------------------------------------------------------------------------------------------------------ No results for input(s): VITAMINB12, FOLATE, FERRITIN, TIBC, IRON, RETICCTPCT in the last 72 hours.  Coagulation profile  Recent Labs Lab 06/19/16 0012  INR 1.06    No results for input(s): DDIMER in the last 72 hours.  Cardiac Enzymes No results for input(s): CKMB, TROPONINI, MYOGLOBIN in the last 168 hours.  Invalid input(s): CK ------------------------------------------------------------------------------------------------------------------    Component Value Date/Time   BNP 183.6 (H) 03/26/2016 1242    Inpatient Medications  Scheduled Meds: . budesonide  0.25 mg Nebulization BID  . calcium-vitamin D  1 tablet Oral Daily  . ceFEPime (MAXIPIME) IV  1 g Intravenous Daily  . diltiazem  240 mg Oral QPM  . enoxaparin (LOVENOX) injection  40 mg Subcutaneous Q24H  . famotidine  20 mg Oral BID  . feeding supplement (ENSURE ENLIVE)  237 mL Oral BID BM  . folic acid  527 mcg Oral Daily  . guaiFENesin  600 mg  Oral BID  . ipratropium-albuterol  3 mL Nebulization BID  . lactose free nutrition  237 mL Oral BID BM  . magnesium oxide  400 mg Oral BID  . methylPREDNISolone (SOLU-MEDROL) injection  60 mg Intravenous Q6H  . morphine  15 mg Oral Q12H  . pantoprazole  80 mg Oral Daily  . polyethylene glycol  17 g Oral Daily  . simvastatin  10 mg Oral q1800  . sodium chloride flush  10-40 mL Intracatheter Q12H  . sodium chloride flush  3 mL Intravenous Q12H   Continuous Infusions: . sodium chloride 75 mL/hr at 06/20/16 1034   PRN Meds:.benzonatate, bisacodyl, HYDROcodone-acetaminophen, ipratropium-albuterol, magnesium hydroxide, metoCLOPramide, sodium chloride flush  Micro Results Recent Results (from the past 240 hour(s))  Urine culture  Status: Abnormal   Collection Time: 06/18/16  7:20 PM  Result Value Ref Range Status   Specimen Description URINE, CLEAN CATCH  Final   Special Requests NONE  Final   Culture MULTIPLE SPECIES PRESENT, SUGGEST RECOLLECTION (A)  Final   Report Status 06/20/2016 FINAL  Final  Culture, blood (Routine X 2)     Status: None (Preliminary result)   Collection Time: 06/18/16  8:07 PM  Result Value Ref Range Status   Specimen Description BLOOD PORTA CATH RIGHT  Final   Special Requests BOTTLES DRAWN AEROBIC AND ANAEROBIC 5 CC EACH  Final   Culture   Final    NO GROWTH < 24 HOURS Performed at [facility]    Report Status PENDING  Incomplete  Culture, blood (Routine X 2)     Status: None (Preliminary result)   Collection Time: 06/18/16  8:11 PM  Result Value Ref Range Status   Specimen Description BLOOD LEFT ANTECUBITAL  Final   Special Requests BOTTLES DRAWN AEROBIC AND ANAEROBIC 5 CC EACH  Final   Culture   Final    NO GROWTH < 24 HOURS Performed at [facility]    Report Status PENDING  Incomplete  MRSA PCR Screening     Status: None   Collection Time: 06/19/16 12:57 AM  Result Value Ref Range Status   MRSA by PCR NEGATIVE NEGATIVE Final     Comment:        The GeneXpert MRSA Assay (FDA approved for NASAL specimens only), is one component of a comprehensive MRSA colonization surveillance program. It is not intended to diagnose MRSA infection nor to guide or monitor treatment for MRSA infections.     Radiology Reports Dg Chest 2 View  Result Date: 06/18/2016 CLINICAL DATA:  Shortness of breath, LEFT chest pain and fever this morning. History of COPD, lung cancer. EXAM: CHEST  2 VIEW COMPARISON:  COPD and RIGHT hilar lymphadenopathy. No acute cardiopulmonary process. FINDINGS: Cardiac silhouette is normal. Fullness of the RIGHT hila corresponding to known mediastinal lymph nodes. No pleural effusion or focal consolidation. Increased lung volumes consistent with history of COPD. Single lumen RIGHT chest Port-A-Cath with distal tip projecting in proximal superior vena cava. High-riding RIGHT humeral head most compatible with old rotator cuff injury. Broad levoscoliosis. IMPRESSION: COPD and RIGHT hilar mass/lymphadenopathy without acute cardiopulmonary process. Electronically Signed   By: Elon Alas M.D.   On: 06/18/2016 20:41   Dg Chest 2 View  Result Date: 05/28/2016 CLINICAL DATA:  SOB increasing x 2 days - currently being treated for lung cancer - hx COPD - hx asthma EXAM: CHEST  2 VIEW COMPARISON:  05/07/2016 FINDINGS: The cardiac silhouette is normal in size. No mediastinal masses or convincing adenopathy. Coarse reticular type opacity extends from superior left hilum. There is fullness from the inferior right hilum. These findings stable. Lungs are hyperexpanded but otherwise clear. No pleural effusion or pneumothorax. Right anterior chest wall Port-A-Cath is stable. Bony thorax is demineralized. Mild compression fracture of a mid to upper thoracic vertebra, also stable. No convincing osteoblastic or osteolytic lesions. IMPRESSION: No acute cardiopulmonary disease. No significant change from the previous chest radiograph.  Electronically Signed   By: Lajean Manes M.D.   On: 05/28/2016 14:49   Ct Chest W Contrast  Result Date: 06/11/2016 CLINICAL DATA:  Followup lung cancer. EXAM: CT CHEST, ABDOMEN, AND PELVIS WITH CONTRAST TECHNIQUE: Multidetector CT imaging of the chest, abdomen and pelvis was performed following the standard protocol during bolus  administration of intravenous contrast. CONTRAST:  154m ISOVUE-300 IOPAMIDOL (ISOVUE-300) INJECTION 61% COMPARISON:  04/16/2016 FINDINGS: CT CHEST FINDINGS Cardiovascular: The heart size is normal. No pericardial effusion. There is aortic atherosclerosis. Calcification within the LAD, and left circumflex coronary artery noted. Mediastinum/Nodes: The trachea appears patent and is midline. Unremarkable appearance of the esophagus. Partially calcified anterior mediastinal, right paratracheal mass measures 3.4 x 3.4 cm, image 12 of series 5. This is compared with 3.9 x 3.7 cm previously. Sub- carinal lymph node measures 7 mm, image 24 of series 5. On the previous exam this measured 10 mm. Lungs/Pleura: No pleural effusion. Advanced changes of centrilobular and paraseptal emphysema identified. Paramediastinal radiation change within the left lung is unchanged from previous exam. The perihilar right lung lesion measures 1.8 x 1.5 cm, image 30 of series 5. On the previous exam this measured 1.7 x 1.5 cm. Tiny nodule in the right lower lobe is unchanged measuring 2 mm, image 84 of series 4. Musculoskeletal: No aggressive lytic or sclerotic bone lesions identified. T4 compression deformity is stable from previous exam. CT ABDOMEN PELVIS FINDINGS Hepatobiliary: Stable hypervascular structure within the medial segment of left lobe of liver measuring 1 cm, image 59 of series 5. Stable 5 mm adjacent hypervascular lesion, image number 62 of series 5. No intrahepatic bile duct dilatation. The gallbladder appears normal. Pancreas: No inflammation or mass identified. Spleen: The spleen appears normal.  Adrenals/Urinary Tract: The adrenal glands are normal. Unremarkable appearance of the right kidney. There is scarring involving the inferior pole of the right kidney. Urinary bladder is unremarkable. Stomach/Bowel: The stomach is normal. The small bowel loops have a normal course and caliber. The colon is negative. Vascular/Lymphatic: Calcified atherosclerotic disease involves the abdominal aorta. No aneurysm. No enlarged retroperitoneal or mesenteric adenopathy. No enlarged pelvic or inguinal lymph nodes. Reproductive: The uterus and adnexal structures are unremarkable for patient's age. Other: There is no ascites or focal fluid collections within the abdomen or pelvis. Musculoskeletal: There is degenerative disc disease noted within the lumbar spine. 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 IMPRESSION: 1. No new or progressive metastatic disease. 2. Large anterior mediastinal right paratracheal lymph node is mildly decreased in size in the interval. There has been decrease in size of the sub- carinal lymph node. 3. Right perihilar soft tissue mass is stable to minimally increased in size in the interval. 4. Emphysema 5. Aortic atherosclerosis and coronary artery calcifications 6. Emphysema 7. Stable appearance of chronic T4 compression deformity and pagetoid disease involving the right femur. Electronically Signed   By: TKerby MoorsM.D.   On: 06/11/2016 08:55   Ct Angio Chest Pe W Or Wo Contrast  Result Date: 06/01/2016 CLINICAL DATA:  Hemoptysis EXAM: CT ANGIOGRAPHY CHEST WITH CONTRAST TECHNIQUE: Multidetector CT imaging of the chest was performed using the standard protocol during bolus administration of intravenous contrast. Multiplanar CT image reconstructions and MIPs were obtained to evaluate the vascular anatomy. CONTRAST:  100 cc Isovue 370 COMPARISON:  02/12/2016 FINDINGS: There are no filling defects in the pulmonary arterial tree to suggest acute pulmonary thromboembolism. Right  peritracheal mass in the upper mediastinum measures 3.2 x 3.1 cm and previously measured 6.4 x 4.5 cm. Is improved. Ill-defined calcifications persist within the mass. Post radiation changes in the medial left lung are stable. Soft tissue prominence in the medial right middle lobe have improved. A focal ground-glass opacity in the lateral basal right lower lobe on image 63 measures 10 mm. Small pleural effusions have developed.  Bilateral dependent atelectasis. Partial solid nodule in the left lower lobe measures 5 mm on image 49. There is fluid or mucus material in left lower lobe airways extending to the posterior basal segment. See image 49. Severe emphysema persists. Interlobular septa are more prominent suggesting interstitial edema or pneumonia. Biapical scarring is stable. No pneumothorax. Right jugular Port-A-Cath is stable. Right ventricle is dilated. Diffuse hepatic steatosis. Calcified granuloma in the right lobe of the liver. Tiny hypodensities in the lateral spleen are nonspecific. Mid-level thoracic compression deformities are stable. This includes T4, T5, and probably T6. Review of the MIP images confirms the above findings. IMPRESSION: No evidence of acute pulmonary thromboembolism New small bilateral pleural effusions with dependent atelectasis New sub cm part solid left lower lobe nodule. New 10 mm ground-glass opacity in the right lower lobe. Central right middle lobe mass and large right paratracheal mass are improved. New interstitial changes suggesting interstitial edema or pneumonia. Electronically Signed   By: Marybelle Killings M.D.   On: 06/01/2016 09:49   Ct Abdomen Pelvis W Contrast  Result Date: 06/11/2016 CLINICAL DATA:  Followup lung cancer. EXAM: CT CHEST, ABDOMEN, AND PELVIS WITH CONTRAST TECHNIQUE: Multidetector CT imaging of the chest, abdomen and pelvis was performed following the standard protocol during bolus administration of intravenous contrast. CONTRAST:  132m ISOVUE-300  IOPAMIDOL (ISOVUE-300) INJECTION 61% COMPARISON:  04/16/2016 FINDINGS: CT CHEST FINDINGS Cardiovascular: The heart size is normal. No pericardial effusion. There is aortic atherosclerosis. Calcification within the LAD, and left circumflex coronary artery noted. Mediastinum/Nodes: The trachea appears patent and is midline. Unremarkable appearance of the esophagus. Partially calcified anterior mediastinal, right paratracheal mass measures 3.4 x 3.4 cm, image 12 of series 5. This is compared with 3.9 x 3.7 cm previously. Sub- carinal lymph node measures 7 mm, image 24 of series 5. On the previous exam this measured 10 mm. Lungs/Pleura: No pleural effusion. Advanced changes of centrilobular and paraseptal emphysema identified. Paramediastinal radiation change within the left lung is unchanged from previous exam. The perihilar right lung lesion measures 1.8 x 1.5 cm, image 30 of series 5. On the previous exam this measured 1.7 x 1.5 cm. Tiny nodule in the right lower lobe is unchanged measuring 2 mm, image 84 of series 4. Musculoskeletal: No aggressive lytic or sclerotic bone lesions identified. T4 compression deformity is stable from previous exam. CT ABDOMEN PELVIS FINDINGS Hepatobiliary: Stable hypervascular structure within the medial segment of left lobe of liver measuring 1 cm, image 59 of series 5. Stable 5 mm adjacent hypervascular lesion, image number 62 of series 5. No intrahepatic bile duct dilatation. The gallbladder appears normal. Pancreas: No inflammation or mass identified. Spleen: The spleen appears normal. Adrenals/Urinary Tract: The adrenal glands are normal. Unremarkable appearance of the right kidney. There is scarring involving the inferior pole of the right kidney. Urinary bladder is unremarkable. Stomach/Bowel: The stomach is normal. The small bowel loops have a normal course and caliber. The colon is negative. Vascular/Lymphatic: Calcified atherosclerotic disease involves the abdominal aorta. No  aneurysm. No enlarged retroperitoneal or mesenteric adenopathy. No enlarged pelvic or inguinal lymph nodes. Reproductive: The uterus and adnexal structures are unremarkable for patient's age. Other: There is no ascites or focal fluid collections within the abdomen or pelvis. Musculoskeletal: There is degenerative disc disease noted within the lumbar spine. 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 IMPRESSION: 1. No new or progressive metastatic disease. 2. Large anterior mediastinal right paratracheal lymph node is mildly decreased in size in the interval. There  has been decrease in size of the sub- carinal lymph node. 3. Right perihilar soft tissue mass is stable to minimally increased in size in the interval. 4. Emphysema 5. Aortic atherosclerosis and coronary artery calcifications 6. Emphysema 7. Stable appearance of chronic T4 compression deformity and pagetoid disease involving the right femur. Electronically Signed   By: Kerby Moors M.D.   On: 06/11/2016 08:55   Dg Chest Port 1 View  Result Date: 05/31/2016 CLINICAL DATA:  Lung cancer, dyspnea EXAM: PORTABLE CHEST 1 VIEW COMPARISON:  05/28/2016 FINDINGS: Cardiomediastinal silhouette is stable. Hyperinflation again noted. Again noted right paratracheal adenopathy. Stable left perihilar postradiation changes. No definite superimposed infiltrate or pulmonary edema. Mild perihilar bronchitic changes. Right IJ Port-A-Cath is unchanged in position. IMPRESSION: Hyperinflation again noted. Again noted right paratracheal adenopathy. Stable left perihilar postradiation changes. No definite superimposed infiltrate or pulmonary edema. Mild perihilar bronchitic changes. Electronically Signed   By: Lahoma Crocker M.D.   On: 05/31/2016 10:12     [facility], Corianna Avallone M.D on 06/20/2016 at 11:27 AM  Between 7am to 7pm - Pager - [phone]  After 7pm go to [email or web address] - password [email or web address]  [facility] -  Office  [phone]

## 2016-06-20 NOTE — Consult Note (Signed)
   [facility] Inpatient Consult   06/20/2016  Destiny Harrison 1941-05-02 [id number]   [facility] Care Management follow up. Went back to bedside. Spoke with Destiny Harrison and husband. Explained and offer [facility] program services. She is agreeable and written consent signed. Explained that she will receive post hospital discharge call and will be evaluated for monthly home visits. Explained that [facility] will not interfere with home health services. Discussed that she will receive post hospital transition of care calls and will be evaluated for monthly home visits. Denies having concerns with medications or with transportation. Discussed post hospital follow up with focus on COPD disease and symptom management. Destiny Harrison endorses she also has lung cancer and is currently undergoing treatment. [facility] Care Management packet and contact information provided. Will request for Destiny Harrison to be assigned.   Marthenia Rolling, MSN-Ed, RN,BSN [facility] Liaison [phone]

## 2016-06-21 MED ORDER — HEPARIN SOD (PORK) LOCK FLUSH 100 UNIT/ML IV SOLN
500.0000 [IU] | INTRAVENOUS | Status: AC | PRN
  Administered 2016-06-21: 500 [IU]

## 2016-06-21 MED ORDER — CEPHALEXIN 500 MG PO CAPS
500.0000 mg | ORAL_CAPSULE | Freq: Two times a day (BID) | ORAL | Status: DC
  Administered 2016-06-21: 500 mg via ORAL
  Filled 2016-06-21: qty 1

## 2016-06-21 MED ORDER — CEPHALEXIN 500 MG PO CAPS: 500.0000 mg | ORAL_CAPSULE | Freq: Two times a day (BID) | ORAL | 0 refills | Status: DC

## 2016-06-21 MED ORDER — GUAIFENESIN ER 600 MG PO TB12: 600.0000 mg | ORAL_TABLET | Freq: Two times a day (BID) | ORAL | 1 refills | Status: AC

## 2016-06-21 MED ORDER — PREDNISONE 10 MG PO TABS: ORAL_TABLET | ORAL | 0 refills | Status: DC

## 2016-06-21 MED ORDER — PREDNISONE 5 MG PO TABS: 5.0000 mg | ORAL_TABLET | Freq: Every day | ORAL | Status: DC

## 2016-06-21 NOTE — Discharge Summary (Signed)
Destiny Harrison, is a 75 y.o. female  DOB Feb 10, 1941  MRN [id number].  Admission date:  06/18/2016  Admitting Physician  Vianne Bulls, MD  Discharge Date:  06/21/2016   Primary MD  Thressa Sheller, MD  Recommendations for primary care physician for things to follow:  - please check CBC, BMP , 2 view chest x-ray during next visit.   Admission Diagnosis  CA pt, fever, SOB   Discharge Diagnosis  CA pt, fever, SOB   Principal Problem:   Sepsis ([place]) Active Problems:   Primary cancer of right upper lobe of lung (HCC)   UTI (lower urinary tract infection)   COPD exacerbation (HCC)   Chronic pain   Hypokalemia   Chronic respiratory failure with hypoxia (HCC)   Antineoplastic chemotherapy induced anemia   Sepsis secondary to UTI ([place])   Pressure ulcer      Past Medical History:  Diagnosis Date  . Asthma   . COPD (chronic obstructive pulmonary disease) ([place])   . DNR (do not resuscitate) 06/26/2015  . Dysphagia 12/07/2015  . Encounter for antineoplastic immunotherapy 05/22/2015  . Hiatal hernia   . History of chemotherapy   . History of radiation therapy 03/06/2012   left hilum  . History of radiation therapy 05/10/2013-05/31/2013   Left lung/ 33/75'@2'$ .25 per fraction x 15 fractions  . Hyperlipidemia   . Neutropenia, drug-induced ([place]) 05/05/2012  . Non-small cell lung cancer ([place]) dx'd 08/28/11   left lung  . Primary cancer of right upper lobe of lung ([place]) 02/06/2012  . Radiation 11/15/13-11/26/13   Right hilum 30 Gy in 10 fractions  . Rheumatoid arthritis(714.0)     Past Surgical History:  Procedure Laterality Date  . appendex  1962  . surgery on right wrist    . VIDEO BRONCHOSCOPY  01/28/2012   Procedure: VIDEO BRONCHOSCOPY WITHOUT FLUORO;  Surgeon: Brand Males, MD;  Location: [facility] ENDOSCOPY;  Service: Endoscopy;  Laterality: Bilateral;       History of present illness and  Hospital Course:      Kindly see H&P for history of present illness and admission details, please review complete Labs, Consult reports and Test reports for all details in brief  HPI  from the history and physical done on the day of admission 06/19/2016 HPI: Destiny Harrison is a 74 y.o. female with medical history significant for metastatic non-small cell lung cancer, COPD with chronic hypoxic respiratory failure, chronic pain, and GERD who presents the emergency department with 1 day of fevers and worsening dyspnea. Patient had recently been hospitalized from 05/28/2016 - 06/06/2016 for management of chemotherapy-induced enteritis and HCAP. She was discharged home to complete a prednisone taper and had been doing well until the day of her presentation when she developed high fevers, worsening dyspnea, and nausea with a couple episodes of nonbloody nonbilious vomiting (despite chart note that reports no N/V). She denies any production of sputum with the cough and denies any associated chest pain or palpitations. She has had some mild lower extremity edema in the past  and has been prescribed a daily Lasix, but this has not been an issue lately. She endorses nausea and nonbloody nonbilious vomiting, but denies abdominal pain, flank pain, or dysuria.   ED Course: Upon arrival to the ED, patient is found to be febrile to 38.2 C, saturating well on 3 L/m nasal cannula, tachycardic in the 130s, and was stable blood pressure. EKG demonstrates a sinus tachycardia with rate 119 and borderline LVH by voltage criteria. Chest x-ray is negative for acute cardiopulmonary disease but reflex COPD and right hilar mass/adenopathy. CMP is notable for a mild hyponatremia and hypochloremia, and serum potassium of 2.8. CBC is notable for a stable normocytic anemia with hemoglobin of 9.3. Lactic acid is reassuring at 1.13 and troponin is negative at 0.01. Urinalysis was obtained and features many bacteria, moderate leukocyte, positive nitrite, and 6-30  WBC. Blood and urine was sent for culture, patient was given a 30 cc/kg normal saline bolus, and empiric treatment with vancomycin and Zosyn was initiated. 10 mEq of potassium was given IV and symptomatic care was provided with morphine and acetaminophen. Patient's tachycardia improved following the fluid bolus and she enjoyed subjective improvement in her dyspnea following a DuoNeb treatment. She will be admitted to the telemetry unit for ongoing evaluation and management of sepsis suspected secondary to UTI in addition to COPD with acute exacerbation.    Hospital Course  75 y.o.femalewith medical history significant formetastatic non-small cell lung cancer, COPD with chronic hypoxic respiratory failure, chronic pain, and GERD who presents the emergency department with 1 day of fevers and worsening dyspnea, Was significant for neutropenia, and UTI.  Sepsis secondary to UTI  - Presents with fevers, sinus tachycardia, and UA suggestive on infection  - Source appears to be urinary; pt denies dysuria, but has abd tenderness, N/V - Historically, urine culture grew E coli resistant to ampicillin, Cipro, Levaquin, and Bactrim, but sensitive to cephalosporins  - Continue with IV cefepime during hospital stay, unfortunately culture are unhelpful, growing multiple species, will discharge on 8 days on oral Keflex  Metastatic NSCLC  - Followed by Dr. Julien Nordmann of oncology in the outpatient setting  -Initially neutropenic, but she Received pegfilgrastim on 06/15/16, no further neutropenia - Discussed with Dr. Inda Merlin  COPD with chronic hypoxic respiratory failure and acute exacerbation  - Requires 3 Lpm supplemental oxygen as baseline; currently saturating well with that  - She was discharged from hospital on 8/17 with prednisone taper, took 30 mg on day of admit  - Improving respiratory status, IV sodium Medrol was tapered, will discharge on prednisone taper over next 2 weeks - Continued scheduled  Pulmicort, prn DuoNeb  - Continue supplemental oxygen, titrated to maintain sat mid 90's   Hypokalemia/hypomagnesemia - Repleted, t  Prolonged QTC - EKGwith QTC of 583, repeat  showed QTC of 503, significantly improved after repleting electrolytes  Normocytic anemia  - Attributed to chemotherapy and managed with pegfilgrastim, last given on 06/15/16  - No s/s of active blood-loss  - Continue folate supplementation   GERD  - Stable  - No EGD on file  - Continue current management with BID Pepcid and daily Protonix   Chronic pain  - Attributed to OA and cancer, stable  - Continue home regimen of long-acting morphine with Norco prn breakthrough pain   Protein calorie malnutrition - on supplements     Discharge Condition:  stable   Follow UP  Follow-up Information    Thressa Sheller, MD .   Specialty:  Internal Medicine Contact  information: [address] [place] [address] [phone]             Discharge Instructions  and  Discharge Medications     Discharge Instructions    AMB Referral to [facility]    Complete by:  As directed   Please assign to [place] for COPD and transition of care. Written consent obtained. Please call with questions. Marthenia Rolling, [place], [facility] RSWNIOE-[phone]   Reason for consult:  Please assign to [facility] RNCM   Diagnoses of:  COPD/ Pneumonia   Expected date of contact:  1-3 days (reserved for hospital discharges)   Discharge instructions    Complete by:  As directed   Follow with Primary MD Thressa Sheller, MD in 7 days   Get CBC, CMP,checked  by Primary MD next visit.    Activity: As tolerated with Full fall precautions use walker/cane & assistance as needed   Disposition Home    Diet: Heart Healthy  , with feeding assistance and aspiration precautions.  For Heart failure patients - Check your Weight same time everyday, if you gain  over 2 pounds, or you develop in leg swelling, experience more shortness of breath or chest pain, call your Primary MD immediately. Follow Cardiac Low Salt Diet and 1.5 lit/day fluid restriction.   On your next visit with your primary care physician please Get Medicines reviewed and adjusted.   Please request your Prim.MD to go over all Hospital Tests and Procedure/Radiological results at the follow up, please get all Hospital records sent to your Prim MD by signing hospital release before you go home.   If you experience worsening of your admission symptoms, develop shortness of breath, life threatening emergency, suicidal or homicidal thoughts you must seek medical attention immediately by calling 911 or calling your MD immediately  if symptoms less severe.  You Must read complete instructions/literature along with all the possible adverse reactions/side effects for all the Medicines you take and that have been prescribed to you. Take any new Medicines after you have completely understood and accpet all the possible adverse reactions/side effects.   Do not drive, operating heavy machinery, perform activities at heights, swimming or participation in water activities or provide baby sitting services if your were admitted for syncope or siezures until you have seen by Primary MD or a Neurologist and advised to do so again.  Do not drive when taking Pain medications.    Do not take more than prescribed Pain, Sleep and Anxiety Medications  Special Instructions: If you have smoked or chewed Tobacco  in the last 2 yrs please stop smoking, stop any regular Alcohol  and or any Recreational drug use.  Wear Seat belts while driving.   Please note  You were cared for by a hospitalist during your hospital stay. If you have any questions about your discharge medications or the care you received while you were in the hospital after you are discharged, you can call the unit and asked to speak with the  hospitalist on call if the hospitalist that took care of you is not available. Once you are discharged, your primary care physician will handle any further medical issues. Please note that NO REFILLS for any discharge medications will be authorized once you are discharged, as it is imperative that you return to your primary care physician (or establish a relationship with a primary care physician if you do not have one) for your aftercare needs so  that they can reassess your need for medications and monitor your lab values.   Increase activity slowly    Complete by:  As directed       Medication List    TAKE these medications   albuterol 108 (90 Base) MCG/ACT inhaler Commonly known as:  PROAIR HFA Inhale 2 puffs into the lungs every 6 (six) hours as needed for wheezing or shortness of breath.   benzonatate 100 MG capsule Commonly known as:  TESSALON Take 100 mg by mouth every 8 (eight) hours as needed for cough.   budesonide 0.25 MG/2ML nebulizer solution Commonly known as:  PULMICORT Take 2 mLs (0.25 mg total) by nebulization 2 (two) times daily.   calcium-vitamin D 500-200 MG-UNIT tablet Commonly known as:  OSCAL WITH D Take 1 tablet by mouth daily.   cephALEXin 500 MG capsule Commonly known as:  KEFLEX Take 1 capsule (500 mg total) by mouth 2 (two) times daily.   dexamethasone 4 MG tablet Commonly known as:  DECADRON Take 8 mg by mouth 2 (two) times daily. The day before, of, and the day after chemo   diltiazem 240 MG 24 hr capsule Commonly known as:  DILACOR XR Take 1 capsule (240 mg total) by mouth every evening.   famotidine 20 MG tablet Commonly known as:  PEPCID Take 1 tablet (20 mg total) by mouth 2 (two) times daily.   folic acid 818 MCG tablet Commonly known as:  FOLVITE Take 400 mcg by mouth daily.   furosemide 20 MG tablet Commonly known as:  LASIX Take 20 mg by mouth daily.   guaiFENesin 600 MG 12 hr tablet Commonly known as:  MUCINEX Take 1 tablet (600  mg total) by mouth 2 (two) times daily. Please start in 3 days. Start taking on:  06/24/2016 What changed:  additional instructions   HYDROcodone-acetaminophen 5-325 MG tablet Commonly known as:  NORCO/VICODIN Take 1 tablet by mouth every 4 (four) hours as needed for moderate pain.   IRON PO Take 1 tablet by mouth daily.   lactose free nutrition Liqd Take 237 mLs by mouth 2 (two) times daily between meals.   magnesium oxide 400 (241.3 Mg) MG tablet Commonly known as:  MAG-OX Take 1 tablet (400 mg total) by mouth 2 (two) times daily.   metoCLOPramide 10 MG tablet Commonly known as:  REGLAN Take 10 mg by mouth every 6 (six) hours as needed for nausea or vomiting.   morphine 15 MG 12 hr tablet Commonly known as:  MS CONTIN Take 1 tablet (15 mg total) by mouth every 12 (twelve) hours.   ondansetron 8 MG disintegrating tablet Commonly known as:  ZOFRAN-ODT DISSOLVE 1 TABLET BY MOUTH EVERY 8 HOURS AS NEEDED FOR NAUSEA AND VOMITING   OXYGEN Inhale 2 L into the lungs continuous.   pantoprazole 40 MG tablet Commonly known as:  PROTONIX Take 80 mg by mouth daily.   polyethylene glycol packet Commonly known as:  MIRALAX / GLYCOLAX MX AND DRK 1 PACKET PO QD MIXED WITH 8 OUNCES OF FLUID   potassium chloride SA 20 MEQ tablet Commonly known as:  K-DUR,KLOR-CON Take 1 tablet (20 mEq total) by mouth daily.   predniSONE 10 MG tablet Commonly known as:  DELTASONE Take '50mg'$  daily for 3days,Take '40mg'$  daily for 3days,Take '30mg'$  daily for 3days,Take '20mg'$  daily for 3days,Take '10mg'$  daily for 3days, then resume 5 mg daily.   predniSONE 5 MG tablet Commonly known as:  DELTASONE Take 1 tablet (5 mg total) by  mouth daily with breakfast. Start taking on:  07/05/2016   simvastatin 10 MG tablet Commonly known as:  ZOCOR Take 10 mg by mouth daily.   VITAMIN C PO Take 1 tablet by mouth daily.   VITAMIN D PO Take 1 tablet by mouth daily.         Diet and Activity recommendation: See  Discharge Instructions above   Consults obtained -  None   Major procedures and Radiology Reports - PLEASE review detailed and final reports for all details, in brief -    Dg Chest 2 View  Result Date: 06/18/2016 CLINICAL DATA:  Shortness of breath, LEFT chest pain and fever this morning. History of COPD, lung cancer. EXAM: CHEST  2 VIEW COMPARISON:  COPD and RIGHT hilar lymphadenopathy. No acute cardiopulmonary process. FINDINGS: Cardiac silhouette is normal. Fullness of the RIGHT hila corresponding to known mediastinal lymph nodes. No pleural effusion or focal consolidation. Increased lung volumes consistent with history of COPD. Single lumen RIGHT chest Port-A-Cath with distal tip projecting in proximal superior vena cava. High-riding RIGHT humeral head most compatible with old rotator cuff injury. Broad levoscoliosis. IMPRESSION: COPD and RIGHT hilar mass/lymphadenopathy without acute cardiopulmonary process. Electronically Signed   By: Elon Alas M.D.   On: 06/18/2016 20:41   Dg Chest 2 View  Result Date: 05/28/2016 CLINICAL DATA:  SOB increasing x 2 days - currently being treated for lung cancer - hx COPD - hx asthma EXAM: CHEST  2 VIEW COMPARISON:  05/07/2016 FINDINGS: The cardiac silhouette is normal in size. No mediastinal masses or convincing adenopathy. Coarse reticular type opacity extends from superior left hilum. There is fullness from the inferior right hilum. These findings stable. Lungs are hyperexpanded but otherwise clear. No pleural effusion or pneumothorax. Right anterior chest wall Port-A-Cath is stable. Bony thorax is demineralized. Mild compression fracture of a mid to upper thoracic vertebra, also stable. No convincing osteoblastic or osteolytic lesions. IMPRESSION: No acute cardiopulmonary disease. No significant change from the previous chest radiograph. Electronically Signed   By: Lajean Manes M.D.   On: 05/28/2016 14:49   Ct Chest W Contrast  Result Date:  06/11/2016 CLINICAL DATA:  Followup lung cancer. EXAM: CT CHEST, ABDOMEN, AND PELVIS WITH CONTRAST TECHNIQUE: Multidetector CT imaging of the chest, abdomen and pelvis was performed following the standard protocol during bolus administration of intravenous contrast. CONTRAST:  130m ISOVUE-300 IOPAMIDOL (ISOVUE-300) INJECTION 61% COMPARISON:  04/16/2016 FINDINGS: CT CHEST FINDINGS Cardiovascular: The heart size is normal. No pericardial effusion. There is aortic atherosclerosis. Calcification within the LAD, and left circumflex coronary artery noted. Mediastinum/Nodes: The trachea appears patent and is midline. Unremarkable appearance of the esophagus. Partially calcified anterior mediastinal, right paratracheal mass measures 3.4 x 3.4 cm, image 12 of series 5. This is compared with 3.9 x 3.7 cm previously. Sub- carinal lymph node measures 7 mm, image 24 of series 5. On the previous exam this measured 10 mm. Lungs/Pleura: No pleural effusion. Advanced changes of centrilobular and paraseptal emphysema identified. Paramediastinal radiation change within the left lung is unchanged from previous exam. The perihilar right lung lesion measures 1.8 x 1.5 cm, image 30 of series 5. On the previous exam this measured 1.7 x 1.5 cm. Tiny nodule in the right lower lobe is unchanged measuring 2 mm, image 84 of series 4. Musculoskeletal: No aggressive lytic or sclerotic bone lesions identified. T4 compression deformity is stable from previous exam. CT ABDOMEN PELVIS FINDINGS Hepatobiliary: Stable hypervascular structure within the medial segment of left lobe  of liver measuring 1 cm, image 59 of series 5. Stable 5 mm adjacent hypervascular lesion, image number 62 of series 5. No intrahepatic bile duct dilatation. The gallbladder appears normal. Pancreas: No inflammation or mass identified. Spleen: The spleen appears normal. Adrenals/Urinary Tract: The adrenal glands are normal. Unremarkable appearance of the right kidney. There is  scarring involving the inferior pole of the right kidney. Urinary bladder is unremarkable. Stomach/Bowel: The stomach is normal. The small bowel loops have a normal course and caliber. The colon is negative. Vascular/Lymphatic: Calcified atherosclerotic disease involves the abdominal aorta. No aneurysm. No enlarged retroperitoneal or mesenteric adenopathy. No enlarged pelvic or inguinal lymph nodes. Reproductive: The uterus and adnexal structures are unremarkable for patient's age. Other: There is no ascites or focal fluid collections within the abdomen or pelvis. Musculoskeletal: There is degenerative disc disease noted within the lumbar spine. 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 IMPRESSION: 1. No new or progressive metastatic disease. 2. Large anterior mediastinal right paratracheal lymph node is mildly decreased in size in the interval. There has been decrease in size of the sub- carinal lymph node. 3. Right perihilar soft tissue mass is stable to minimally increased in size in the interval. 4. Emphysema 5. Aortic atherosclerosis and coronary artery calcifications 6. Emphysema 7. Stable appearance of chronic T4 compression deformity and pagetoid disease involving the right femur. Electronically Signed   By: Kerby Moors M.D.   On: 06/11/2016 08:55   Ct Angio Chest Pe W Or Wo Contrast  Result Date: 06/01/2016 CLINICAL DATA:  Hemoptysis EXAM: CT ANGIOGRAPHY CHEST WITH CONTRAST TECHNIQUE: Multidetector CT imaging of the chest was performed using the standard protocol during bolus administration of intravenous contrast. Multiplanar CT image reconstructions and MIPs were obtained to evaluate the vascular anatomy. CONTRAST:  100 cc Isovue 370 COMPARISON:  02/12/2016 FINDINGS: There are no filling defects in the pulmonary arterial tree to suggest acute pulmonary thromboembolism. Right peritracheal mass in the upper mediastinum measures 3.2 x 3.1 cm and previously measured 6.4 x 4.5 cm. Is  improved. Ill-defined calcifications persist within the mass. Post radiation changes in the medial left lung are stable. Soft tissue prominence in the medial right middle lobe have improved. A focal ground-glass opacity in the lateral basal right lower lobe on image 63 measures 10 mm. Small pleural effusions have developed. Bilateral dependent atelectasis. Partial solid nodule in the left lower lobe measures 5 mm on image 49. There is fluid or mucus material in left lower lobe airways extending to the posterior basal segment. See image 49. Severe emphysema persists. Interlobular septa are more prominent suggesting interstitial edema or pneumonia. Biapical scarring is stable. No pneumothorax. Right jugular Port-A-Cath is stable. Right ventricle is dilated. Diffuse hepatic steatosis. Calcified granuloma in the right lobe of the liver. Tiny hypodensities in the lateral spleen are nonspecific. Mid-level thoracic compression deformities are stable. This includes T4, T5, and probably T6. Review of the MIP images confirms the above findings. IMPRESSION: No evidence of acute pulmonary thromboembolism New small bilateral pleural effusions with dependent atelectasis New sub cm part solid left lower lobe nodule. New 10 mm ground-glass opacity in the right lower lobe. Central right middle lobe mass and large right paratracheal mass are improved. New interstitial changes suggesting interstitial edema or pneumonia. Electronically Signed   By: Marybelle Killings M.D.   On: 06/01/2016 09:49   Ct Abdomen Pelvis W Contrast  Result Date: 06/11/2016 CLINICAL DATA:  Followup lung cancer. EXAM: CT CHEST, ABDOMEN, AND PELVIS WITH  CONTRAST TECHNIQUE: Multidetector CT imaging of the chest, abdomen and pelvis was performed following the standard protocol during bolus administration of intravenous contrast. CONTRAST:  136m ISOVUE-300 IOPAMIDOL (ISOVUE-300) INJECTION 61% COMPARISON:  04/16/2016 FINDINGS: CT CHEST FINDINGS Cardiovascular: The  heart size is normal. No pericardial effusion. There is aortic atherosclerosis. Calcification within the LAD, and left circumflex coronary artery noted. Mediastinum/Nodes: The trachea appears patent and is midline. Unremarkable appearance of the esophagus. Partially calcified anterior mediastinal, right paratracheal mass measures 3.4 x 3.4 cm, image 12 of series 5. This is compared with 3.9 x 3.7 cm previously. Sub- carinal lymph node measures 7 mm, image 24 of series 5. On the previous exam this measured 10 mm. Lungs/Pleura: No pleural effusion. Advanced changes of centrilobular and paraseptal emphysema identified. Paramediastinal radiation change within the left lung is unchanged from previous exam. The perihilar right lung lesion measures 1.8 x 1.5 cm, image 30 of series 5. On the previous exam this measured 1.7 x 1.5 cm. Tiny nodule in the right lower lobe is unchanged measuring 2 mm, image 84 of series 4. Musculoskeletal: No aggressive lytic or sclerotic bone lesions identified. T4 compression deformity is stable from previous exam. CT ABDOMEN PELVIS FINDINGS Hepatobiliary: Stable hypervascular structure within the medial segment of left lobe of liver measuring 1 cm, image 59 of series 5. Stable 5 mm adjacent hypervascular lesion, image number 62 of series 5. No intrahepatic bile duct dilatation. The gallbladder appears normal. Pancreas: No inflammation or mass identified. Spleen: The spleen appears normal. Adrenals/Urinary Tract: The adrenal glands are normal. Unremarkable appearance of the right kidney. There is scarring involving the inferior pole of the right kidney. Urinary bladder is unremarkable. Stomach/Bowel: The stomach is normal. The small bowel loops have a normal course and caliber. The colon is negative. Vascular/Lymphatic: Calcified atherosclerotic disease involves the abdominal aorta. No aneurysm. No enlarged retroperitoneal or mesenteric adenopathy. No enlarged pelvic or inguinal lymph nodes.  Reproductive: The uterus and adnexal structures are unremarkable for patient's age. Other: There is no ascites or focal fluid collections within the abdomen or pelvis. Musculoskeletal: There is degenerative disc disease noted within the lumbar spine. 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 IMPRESSION: 1. No new or progressive metastatic disease. 2. Large anterior mediastinal right paratracheal lymph node is mildly decreased in size in the interval. There has been decrease in size of the sub- carinal lymph node. 3. Right perihilar soft tissue mass is stable to minimally increased in size in the interval. 4. Emphysema 5. Aortic atherosclerosis and coronary artery calcifications 6. Emphysema 7. Stable appearance of chronic T4 compression deformity and pagetoid disease involving the right femur. Electronically Signed   By: TKerby MoorsM.D.   On: 06/11/2016 08:55   Dg Chest Port 1 View  Result Date: 05/31/2016 CLINICAL DATA:  Lung cancer, dyspnea EXAM: PORTABLE CHEST 1 VIEW COMPARISON:  05/28/2016 FINDINGS: Cardiomediastinal silhouette is stable. Hyperinflation again noted. Again noted right paratracheal adenopathy. Stable left perihilar postradiation changes. No definite superimposed infiltrate or pulmonary edema. Mild perihilar bronchitic changes. Right IJ Port-A-Cath is unchanged in position. IMPRESSION: Hyperinflation again noted. Again noted right paratracheal adenopathy. Stable left perihilar postradiation changes. No definite superimposed infiltrate or pulmonary edema. Mild perihilar bronchitic changes. Electronically Signed   By: LLahoma CrockerM.D.   On: 05/31/2016 10:12    Micro Results    Recent Results (from the past 240 hour(s))  Urine culture     Status: Abnormal   Collection Time: 06/18/16  7:20 PM  Result Value Ref Range Status   Specimen Description URINE, CLEAN CATCH  Final   Special Requests NONE  Final   Culture MULTIPLE SPECIES PRESENT, SUGGEST RECOLLECTION (A)  Final    Report Status 06/20/2016 FINAL  Final  Culture, blood (Routine X 2)     Status: None (Preliminary result)   Collection Time: 06/18/16  8:07 PM  Result Value Ref Range Status   Specimen Description BLOOD PORTA CATH RIGHT  Final   Special Requests BOTTLES DRAWN AEROBIC AND ANAEROBIC 5 CC EACH  Final   Culture   Final    NO GROWTH 3 DAYS Performed at [facility]    Report Status PENDING  Incomplete  Culture, blood (Routine X 2)     Status: None (Preliminary result)   Collection Time: 06/18/16  8:11 PM  Result Value Ref Range Status   Specimen Description BLOOD LEFT ANTECUBITAL  Final   Special Requests BOTTLES DRAWN AEROBIC AND ANAEROBIC 5 CC EACH  Final   Culture   Final    NO GROWTH 3 DAYS Performed at [facility]    Report Status PENDING  Incomplete  MRSA PCR Screening     Status: None   Collection Time: 06/19/16 12:57 AM  Result Value Ref Range Status   MRSA by PCR NEGATIVE NEGATIVE Final    Comment:        The GeneXpert MRSA Assay (FDA approved for NASAL specimens only), is one component of a comprehensive MRSA colonization surveillance program. It is not intended to diagnose MRSA infection nor to guide or monitor treatment for MRSA infections.        Today   Subjective:   Destiny Harrison today has no headache,no chest or abdominal pain, feels much better  Today, reports cough significantly improved  Objective:   Blood pressure 117/60, pulse 95, temperature 98.2 F (36.8 C), temperature source Oral, resp. rate 20, height '5\' 5"'$  (1.651 m), weight 51.4 kg (113 lb 4.8 oz), SpO2 100 %.   Intake/Output Summary (Last 24 hours) at 06/21/16 1142 Last data filed at 06/21/16 1019  Gross per 24 hour  Intake           1437.5 ml  Output              900 ml  Net            537.5 ml    Exam Awake Alert, Oriented X 3 Supple Neck,No JVD, Symmetrical Chest wall movement, Good air movement bilaterally,  RRR,No Gallops +ve B.Sounds, Abd Soft, No tenderness,  No organomegaly appriciated, No rebound - guarding or rigidity. No Cyanosis, Clubbing or edema, No new Rash or bruise     Data Review   CBC w Diff: Lab Results  Component Value Date   WBC 6.3 06/20/2016   HGB 8.4 (L) 06/20/2016   HGB 8.8 (L) 06/13/2016   HCT 25.6 (L) 06/20/2016   HCT 27.3 (L) 06/13/2016   PLT 163 06/20/2016   PLT 484 (H) 06/13/2016   LYMPHOPCT 4 06/20/2016   LYMPHOPCT 2.4 (L) 06/13/2016   BANDSPCT 5 02/16/2016   MONOPCT 16 06/20/2016   MONOPCT 2.5 06/13/2016   EOSPCT 0 06/20/2016   EOSPCT 0.0 06/13/2016   BASOPCT 0 06/20/2016   BASOPCT 0.0 06/13/2016    CMP: Lab Results  Component Value Date   NA 139 06/20/2016   NA 139 06/13/2016   K 4.8 06/20/2016   K 3.7 06/13/2016   CL 104 06/20/2016   CL 101  01/12/2013   CO2 31 06/20/2016   CO2 28 06/13/2016   BUN 17 06/20/2016   BUN 19.6 06/13/2016   CREATININE 0.57 06/20/2016   CREATININE 0.7 06/13/2016   PROT 5.9 (L) 06/18/2016   PROT 6.3 (L) 06/13/2016   ALBUMIN 3.3 (L) 06/18/2016   ALBUMIN 2.9 (L) 06/13/2016   BILITOT 1.4 (H) 06/18/2016   BILITOT 0.35 06/13/2016   ALKPHOS 78 06/18/2016   ALKPHOS 92 06/13/2016   AST 13 (L) 06/18/2016   AST 10 06/13/2016   ALT 13 (L) 06/18/2016   ALT 15 06/13/2016  .   Total Time in preparing paper work, data evaluation and todays exam - 35 minutes  Onofrio Klemp M.D on 06/21/2016 at 11:42 AM  [facility]   Office  [phone]

## 2016-06-21 NOTE — Evaluation (Signed)
Physical Therapy Evaluation Patient Details Name: Destiny Harrison MRN: [id number] DOB: 07/03/1941 Today's Date: 06/21/2016   History of Present Illness  75 y.o. female with medical history significant for metastatic non-small cell lung cancer, COPD with chronic hypoxic respiratory failure, chronic pain, and GERD and admitted for sepsis due to UTI  Clinical Impression  Pt admitted with above diagnosis. Pt currently with functional limitations due to the deficits listed below (see PT Problem List).  Pt will benefit from skilled PT to increase their independence and safety with mobility to allow discharge to the venue listed below.  Pt familiar to this PT from previous admission and appears close to her baseline.  Pt has family assist at home if needed.  Will continue to see if remains in acute however pt will likely d/c later today.     Follow Up Recommendations No PT follow up    Equipment Recommendations  None recommended by PT    Recommendations for Other Services       Precautions / Restrictions Precautions Precaution Comments: chronic 3L O2 Restrictions Weight Bearing Restrictions: No      Mobility  Bed Mobility Overal bed mobility: Modified Independent             General bed mobility comments: increased time   Transfers Overall transfer level: Needs assistance Equipment used: None Transfers: Sit to/from Stand Sit to Stand: Min guard            Ambulation/Gait Ambulation/Gait assistance: Min guard Ambulation Distance (Feet): 60 Feet Assistive device: None (IV pole) Gait Pattern/deviations: Step-through pattern;Decreased stride length     General Gait Details: slow but steady gait, remained on 3L O2 [place], HR 120 during ambulation, distance to tolerance  Stairs            Wheelchair Mobility    Modified Rankin (Stroke Patients Only)       Balance                                             Pertinent Vitals/Pain Pain  Assessment: No/denies pain    Home Living Family/patient expects to be discharged to:: Private residence Living Arrangements: Spouse/significant other Available Help at Discharge: Family Type of Home: House       Home Layout: One level Home Equipment: Environmental consultant - 2 wheels;Cane - single point;Bedside commode      Prior Function Level of Independence: Independent               Hand Dominance        Extremity/Trunk Assessment               Lower Extremity Assessment: Generalized weakness         Communication   Communication: No difficulties  Cognition Arousal/Alertness: Awake/alert Behavior During Therapy: WFL for tasks assessed/performed Overall Cognitive Status: Within Functional Limits for tasks assessed                      General Comments      Exercises        Assessment/Plan    PT Assessment Patient needs continued PT services  PT Diagnosis Difficulty walking;Generalized weakness   PT Problem List Decreased strength;Decreased activity tolerance;Cardiopulmonary status limiting activity;Decreased mobility  PT Treatment Interventions DME instruction;Gait training;Functional mobility training;Therapeutic exercise;Therapeutic activities;Patient/family education   PT Goals (Current goals can be found in the Care  Plan section) Acute Rehab PT Goals PT Goal Formulation: With patient Time For Goal Achievement: 07/05/16 Potential to Achieve Goals: Good    Frequency Min 3X/week   Barriers to discharge        Co-evaluation               End of Session Equipment Utilized During Treatment: Oxygen Activity Tolerance: Patient tolerated treatment well Patient left: in chair;with call bell/phone within reach;with nursing/sitter in room Nurse Communication: Mobility status         Time: 1040-1056 PT Time Calculation (min) (ACUTE ONLY): 16 min   Charges:   PT Evaluation $PT Eval Low Complexity: 1 Procedure     PT G Codes:         Derek Laughter,KATHrine E 06/21/2016, 1:11 PM Carmelia Bake, PT, DPT 06/21/2016 Pager: [phone]

## 2016-06-21 NOTE — Discharge Instructions (Signed)
Follow with Primary MD Thressa Sheller, MD in 7 days   Get CBC, CMP,checked  by Primary MD next visit.    Activity: As tolerated with Full fall precautions use walker/cane & assistance as needed   Disposition Home    Diet: Heart Healthy  , with feeding assistance and aspiration precautions.  For Heart failure patients - Check your Weight same time everyday, if you gain over 2 pounds, or you develop in leg swelling, experience more shortness of breath or chest pain, call your Primary MD immediately. Follow Cardiac Low Salt Diet and 1.5 lit/day fluid restriction.   On your next visit with your primary care physician please Get Medicines reviewed and adjusted.   Please request your Prim.MD to go over all Hospital Tests and Procedure/Radiological results at the follow up, please get all Hospital records sent to your Prim MD by signing hospital release before you go home.   If you experience worsening of your admission symptoms, develop shortness of breath, life threatening emergency, suicidal or homicidal thoughts you must seek medical attention immediately by calling 911 or calling your MD immediately  if symptoms less severe.  You Must read complete instructions/literature along with all the possible adverse reactions/side effects for all the Medicines you take and that have been prescribed to you. Take any new Medicines after you have completely understood and accpet all the possible adverse reactions/side effects.   Do not drive, operating heavy machinery, perform activities at heights, swimming or participation in water activities or provide baby sitting services if your were admitted for syncope or siezures until you have seen by Primary MD or a Neurologist and advised to do so again.  Do not drive when taking Pain medications.    Do not take more than prescribed Pain, Sleep and Anxiety Medications  Special Instructions: If you have smoked or chewed Tobacco  in the last 2 yrs  please stop smoking, stop any regular Alcohol  and or any Recreational drug use.  Wear Seat belts while driving.   Please note  You were cared for by a hospitalist during your hospital stay. If you have any questions about your discharge medications or the care you received while you were in the hospital after you are discharged, you can call the unit and asked to speak with the hospitalist on call if the hospitalist that took care of you is not available. Once you are discharged, your primary care physician will handle any further medical issues. Please note that NO REFILLS for any discharge medications will be authorized once you are discharged, as it is imperative that you return to your primary care physician (or establish a relationship with a primary care physician if you do not have one) for your aftercare needs so that they can reassess your need for medications and monitor your lab values.

## 2016-06-22 ENCOUNTER — Other Ambulatory Visit: Payer: Self-pay

## 2016-06-22 ENCOUNTER — Inpatient Hospital Stay (HOSPITAL_COMMUNITY): Payer: Medicare Other

## 2016-06-22 ENCOUNTER — Inpatient Hospital Stay (HOSPITAL_COMMUNITY)
Admission: EM | Admit: 2016-06-22 | Discharge: 2016-06-27 | DRG: 871 | Disposition: A | Discharge status: Home Health | Payer: Medicare Other | Attending: Internal Medicine | Admitting: Internal Medicine

## 2016-06-22 ENCOUNTER — Encounter (HOSPITAL_COMMUNITY): Payer: Self-pay

## 2016-06-22 ENCOUNTER — Emergency Department (HOSPITAL_COMMUNITY): Payer: Medicare Other

## 2016-06-22 DIAGNOSIS — J449 Chronic obstructive pulmonary disease, unspecified: Secondary | ICD-10-CM | POA: Diagnosis present

## 2016-06-22 DIAGNOSIS — Z681 Body mass index (BMI) 19 or less, adult: Secondary | ICD-10-CM

## 2016-06-22 DIAGNOSIS — Z87891 Personal history of nicotine dependence: Secondary | ICD-10-CM

## 2016-06-22 DIAGNOSIS — Z79899 Other long term (current) drug therapy: Secondary | ICD-10-CM

## 2016-06-22 DIAGNOSIS — J690 Pneumonitis due to inhalation of food and vomit: Secondary | ICD-10-CM | POA: Diagnosis not present

## 2016-06-22 DIAGNOSIS — A419 Sepsis, unspecified organism: Secondary | ICD-10-CM | POA: Diagnosis not present

## 2016-06-22 DIAGNOSIS — Z79891 Long term (current) use of opiate analgesic: Secondary | ICD-10-CM | POA: Diagnosis not present

## 2016-06-22 DIAGNOSIS — Z923 Personal history of irradiation: Secondary | ICD-10-CM | POA: Diagnosis not present

## 2016-06-22 DIAGNOSIS — M069 Rheumatoid arthritis, unspecified: Secondary | ICD-10-CM | POA: Diagnosis present

## 2016-06-22 DIAGNOSIS — Z833 Family history of diabetes mellitus: Secondary | ICD-10-CM

## 2016-06-22 DIAGNOSIS — Z9221 Personal history of antineoplastic chemotherapy: Secondary | ICD-10-CM

## 2016-06-22 DIAGNOSIS — Z7951 Long term (current) use of inhaled steroids: Secondary | ICD-10-CM

## 2016-06-22 DIAGNOSIS — R000 Tachycardia, unspecified: Secondary | ICD-10-CM | POA: Diagnosis present

## 2016-06-22 DIAGNOSIS — J9620 Acute and chronic respiratory failure, unspecified whether with hypoxia or hypercapnia: Secondary | ICD-10-CM

## 2016-06-22 DIAGNOSIS — Z66 Do not resuscitate: Secondary | ICD-10-CM | POA: Diagnosis present

## 2016-06-22 DIAGNOSIS — E43 Unspecified severe protein-calorie malnutrition: Secondary | ICD-10-CM | POA: Diagnosis not present

## 2016-06-22 DIAGNOSIS — L8992 Pressure ulcer of unspecified site, stage 2: Secondary | ICD-10-CM | POA: Diagnosis present

## 2016-06-22 DIAGNOSIS — J9621 Acute and chronic respiratory failure with hypoxia: Secondary | ICD-10-CM | POA: Diagnosis present

## 2016-06-22 DIAGNOSIS — T451X5A Adverse effect of antineoplastic and immunosuppressive drugs, initial encounter: Secondary | ICD-10-CM | POA: Diagnosis present

## 2016-06-22 DIAGNOSIS — G8929 Other chronic pain: Secondary | ICD-10-CM | POA: Diagnosis present

## 2016-06-22 DIAGNOSIS — E785 Hyperlipidemia, unspecified: Secondary | ICD-10-CM | POA: Diagnosis present

## 2016-06-22 DIAGNOSIS — D6481 Anemia due to antineoplastic chemotherapy: Secondary | ICD-10-CM | POA: Diagnosis present

## 2016-06-22 DIAGNOSIS — J189 Pneumonia, unspecified organism: Secondary | ICD-10-CM

## 2016-06-22 DIAGNOSIS — K219 Gastro-esophageal reflux disease without esophagitis: Secondary | ICD-10-CM | POA: Diagnosis present

## 2016-06-22 DIAGNOSIS — C3410 Malignant neoplasm of upper lobe, unspecified bronchus or lung: Secondary | ICD-10-CM | POA: Diagnosis present

## 2016-06-22 DIAGNOSIS — R1310 Dysphagia, unspecified: Secondary | ICD-10-CM | POA: Diagnosis not present

## 2016-06-22 DIAGNOSIS — R0602 Shortness of breath: Secondary | ICD-10-CM | POA: Diagnosis not present

## 2016-06-22 DIAGNOSIS — J441 Chronic obstructive pulmonary disease with (acute) exacerbation: Secondary | ICD-10-CM | POA: Diagnosis present

## 2016-06-22 DIAGNOSIS — C3411 Malignant neoplasm of upper lobe, right bronchus or lung: Secondary | ICD-10-CM | POA: Diagnosis not present

## 2016-06-22 DIAGNOSIS — Z7952 Long term (current) use of systemic steroids: Secondary | ICD-10-CM | POA: Diagnosis not present

## 2016-06-22 DIAGNOSIS — T458X5A Adverse effect of other primarily systemic and hematological agents, initial encounter: Secondary | ICD-10-CM | POA: Diagnosis present

## 2016-06-22 LAB — URINALYSIS, ROUTINE W REFLEX MICROSCOPIC
BILIRUBIN URINE: NEGATIVE
GLUCOSE, UA: NEGATIVE mg/dL
Hgb urine dipstick: NEGATIVE
KETONES UR: NEGATIVE mg/dL
Leukocytes, UA: NEGATIVE
Nitrite: NEGATIVE
PH: 7.5 (ref 5.0–8.0)
Protein, ur: NEGATIVE mg/dL
SPECIFIC GRAVITY, URINE: 1.014 (ref 1.005–1.030)

## 2016-06-22 LAB — CBC WITH DIFFERENTIAL/PLATELET
BASOS ABS: 0 10*3/uL (ref 0.0–0.1)
Basophils Relative: 0 %
EOS ABS: 0 10*3/uL (ref 0.0–0.7)
Eosinophils Relative: 0 %
HCT: 26.7 % — ABNORMAL LOW (ref 36.0–46.0)
Hemoglobin: 8.7 g/dL — ABNORMAL LOW (ref 12.0–15.0)
LYMPHS PCT: 2 %
Lymphs Abs: 0.8 10*3/uL (ref 0.7–4.0)
MCH: 25.9 pg — AB (ref 26.0–34.0)
MCHC: 32.6 g/dL (ref 30.0–36.0)
MCV: 79.5 fL (ref 78.0–100.0)
MONO ABS: 2 10*3/uL — AB (ref 0.1–1.0)
Monocytes Relative: 5 %
NEUTROS ABS: 38 10*3/uL — AB (ref 1.7–7.7)
Neutrophils Relative %: 93 %
PLATELETS: 192 10*3/uL (ref 150–400)
RBC: 3.36 MIL/uL — ABNORMAL LOW (ref 3.87–5.11)
RDW: 20 % — AB (ref 11.5–15.5)
WBC: 40.8 10*3/uL — ABNORMAL HIGH (ref 4.0–10.5)

## 2016-06-22 LAB — COMPREHENSIVE METABOLIC PANEL
ALK PHOS: 146 U/L — AB (ref 38–126)
ALT: 18 U/L (ref 14–54)
ANION GAP: 8 (ref 5–15)
AST: 17 U/L (ref 15–41)
Albumin: 2.8 g/dL — ABNORMAL LOW (ref 3.5–5.0)
BILIRUBIN TOTAL: 0.8 mg/dL (ref 0.3–1.2)
BUN: 16 mg/dL (ref 6–20)
CALCIUM: 9.4 mg/dL (ref 8.9–10.3)
CO2: 32 mmol/L (ref 22–32)
Chloride: 100 mmol/L — ABNORMAL LOW (ref 101–111)
Creatinine, Ser: 0.63 mg/dL (ref 0.44–1.00)
GFR calc Af Amer: >60 mL/min (ref >60)
Glucose, Bld: 159 mg/dL — ABNORMAL HIGH (ref 65–99)
POTASSIUM: 4.3 mmol/L (ref 3.5–5.1)
Sodium: 140 mmol/L (ref 135–145)
TOTAL PROTEIN: 6 g/dL — AB (ref 6.5–8.1)

## 2016-06-22 LAB — I-STAT CG4 LACTIC ACID, ED: LACTIC ACID, VENOUS: 3.29 mmol/L — AB (ref 0.5–1.9)

## 2016-06-22 LAB — LACTIC ACID, PLASMA
LACTIC ACID, VENOUS: 2.9 mmol/L — AB (ref 0.5–1.9)
LACTIC ACID, VENOUS: 1.3 mmol/L (ref 0.5–1.9)

## 2016-06-22 LAB — D-DIMER, QUANTITATIVE: D-Dimer, Quant: 0.87 ug/mL-FEU — ABNORMAL HIGH (ref 0.00–0.50)

## 2016-06-22 LAB — APTT: APTT: 38 s — AB (ref 24–36)

## 2016-06-22 LAB — BRAIN NATRIURETIC PEPTIDE: B NATRIURETIC PEPTIDE 5: 388.1 pg/mL — AB (ref 0.0–100.0)

## 2016-06-22 LAB — PROTIME-INR
INR: 1.07
PROTHROMBIN TIME: 14 s (ref 11.4–15.2)

## 2016-06-22 LAB — I-STAT TROPONIN, ED: Troponin i, poc: 0.03 ng/mL (ref 0.00–0.08)

## 2016-06-22 LAB — PROCALCITONIN: Procalcitonin: <0.1 ng/mL

## 2016-06-22 MED ORDER — SODIUM CHLORIDE 0.9 % IV BOLUS (SEPSIS): 1000.0000 mL | Freq: Once | INTRAVENOUS | Status: DC

## 2016-06-22 MED ORDER — SODIUM CHLORIDE 0.9 % IV BOLUS (SEPSIS)
1000.0000 mL | Freq: Once | INTRAVENOUS | Status: AC
  Administered 2016-06-22: 1000 mL via INTRAVENOUS

## 2016-06-22 MED ORDER — ACETAMINOPHEN 650 MG RE SUPP
650.0000 mg | Freq: Once | RECTAL | Status: AC
  Administered 2016-06-22: 650 mg via RECTAL
  Filled 2016-06-22: qty 1

## 2016-06-22 MED ORDER — SODIUM CHLORIDE 0.9 % IV SOLN
INTRAVENOUS | Status: DC
  Administered 2016-06-22 – 2016-06-26 (×5): via INTRAVENOUS

## 2016-06-22 MED ORDER — IPRATROPIUM BROMIDE 0.02 % IN SOLN
0.5000 mg | Freq: Once | RESPIRATORY_TRACT | Status: AC
  Administered 2016-06-22: 0.5 mg via RESPIRATORY_TRACT
  Filled 2016-06-22: qty 2.5

## 2016-06-22 MED ORDER — IOPAMIDOL (ISOVUE-370) INJECTION 76%
100.0000 mL | Freq: Once | INTRAVENOUS | Status: AC | PRN
  Administered 2016-06-22: 100 mL via INTRAVENOUS

## 2016-06-22 MED ORDER — HEPARIN SODIUM (PORCINE) 5000 UNIT/ML IJ SOLN
5000.0000 [IU] | Freq: Three times a day (TID) | INTRAMUSCULAR | Status: DC
  Administered 2016-06-22 – 2016-06-27 (×14): 5000 [IU] via SUBCUTANEOUS
  Filled 2016-06-22 (×14): qty 1

## 2016-06-22 MED ORDER — ALBUTEROL SULFATE (2.5 MG/3ML) 0.083% IN NEBU
2.5000 mg | INHALATION_SOLUTION | Freq: Four times a day (QID) | RESPIRATORY_TRACT | Status: DC
  Administered 2016-06-22 – 2016-06-23 (×2): 2.5 mg via RESPIRATORY_TRACT
  Filled 2016-06-22: qty 3

## 2016-06-22 MED ORDER — VANCOMYCIN HCL 500 MG IV SOLR
500.0000 mg | Freq: Two times a day (BID) | INTRAVENOUS | Status: DC
  Administered 2016-06-23: 500 mg via INTRAVENOUS
  Filled 2016-06-22: qty 500

## 2016-06-22 MED ORDER — VANCOMYCIN HCL IN DEXTROSE 1-5 GM/200ML-% IV SOLN
1000.0000 mg | Freq: Once | INTRAVENOUS | Status: AC
  Administered 2016-06-22: 1000 mg via INTRAVENOUS
  Filled 2016-06-22: qty 200

## 2016-06-22 MED ORDER — METHYLPREDNISOLONE SODIUM SUCC 125 MG IJ SOLR
80.0000 mg | Freq: Three times a day (TID) | INTRAMUSCULAR | Status: DC
  Administered 2016-06-22 – 2016-06-23 (×2): 80 mg via INTRAVENOUS
  Filled 2016-06-22 (×2): qty 2

## 2016-06-22 MED ORDER — ALBUTEROL SULFATE (2.5 MG/3ML) 0.083% IN NEBU
5.0000 mg | INHALATION_SOLUTION | Freq: Once | RESPIRATORY_TRACT | Status: AC
  Administered 2016-06-22: 5 mg via RESPIRATORY_TRACT
  Filled 2016-06-22: qty 6

## 2016-06-22 MED ORDER — ACETAMINOPHEN 325 MG PO TABS: 650.0000 mg | ORAL_TABLET | Freq: Once | ORAL | Status: DC

## 2016-06-22 MED ORDER — ALBUTEROL SULFATE (2.5 MG/3ML) 0.083% IN NEBU
2.5000 mg | INHALATION_SOLUTION | RESPIRATORY_TRACT | Status: DC | PRN
Start: 2016-06-22 — End: 2016-06-27
  Filled 2016-06-22: qty 3

## 2016-06-22 MED ORDER — SODIUM CHLORIDE 0.9 % IV BOLUS (SEPSIS)
500.0000 mL | Freq: Once | INTRAVENOUS | Status: AC
  Administered 2016-06-22: 500 mL via INTRAVENOUS

## 2016-06-22 MED ORDER — SODIUM CHLORIDE 0.9 % IV SOLN
INTRAVENOUS | Status: DC
  Administered 2016-06-22: 20:00:00 via INTRAVENOUS

## 2016-06-22 MED ORDER — DEXTROSE 5 % IV SOLN
2.0000 g | Freq: Once | INTRAVENOUS | Status: AC
  Administered 2016-06-22: 2 g via INTRAVENOUS
  Filled 2016-06-22: qty 2

## 2016-06-22 MED ORDER — IPRATROPIUM BROMIDE 0.02 % IN SOLN
0.5000 mg | Freq: Four times a day (QID) | RESPIRATORY_TRACT | Status: DC
  Administered 2016-06-22 – 2016-06-23 (×2): 0.5 mg via RESPIRATORY_TRACT
  Filled 2016-06-22: qty 2.5

## 2016-06-22 MED ORDER — SODIUM CHLORIDE 0.9 % IV BOLUS (SEPSIS)
250.0000 mL | Freq: Once | INTRAVENOUS | Status: AC
  Administered 2016-06-22: 250 mL via INTRAVENOUS

## 2016-06-22 MED ORDER — METOPROLOL TARTRATE 5 MG/5ML IV SOLN
2.5000 mg | Freq: Once | INTRAVENOUS | Status: AC
  Administered 2016-06-22: 2.5 mg via INTRAVENOUS
  Filled 2016-06-22: qty 5

## 2016-06-22 MED ORDER — SODIUM CHLORIDE 0.9 % IV BOLUS (SEPSIS)
1000.0000 mL | Freq: Once | INTRAVENOUS | Status: AC
Start: 2016-06-22 — End: 2016-06-22
  Administered 2016-06-22: 1000 mL via INTRAVENOUS

## 2016-06-22 MED ORDER — PIPERACILLIN-TAZOBACTAM 3.375 G IVPB
3.3750 g | Freq: Three times a day (TID) | INTRAVENOUS | Status: DC
  Administered 2016-06-22 – 2016-06-27 (×14): 3.375 g via INTRAVENOUS
  Filled 2016-06-22 (×16): qty 50

## 2016-06-22 NOTE — ED Notes (Signed)
Pt unable to provide urine sample at this time 

## 2016-06-22 NOTE — Progress Notes (Addendum)
Pharmacy Antibiotic Note  Destiny Harrison is a 75 y.o. female with metastatic NSCLC and COPD, recently hospitalized for sepsis 2/2 UTI and discharged on 9/1, admitted on 06/22/2016 with HCAP.  Pharmacy has been consulted for Vancomycin and Cefepime dosing. 1st doses ordered in ED.   Addendum:  MD has changed antibiotics to Vancomycin and Zosyn.   SCr = 0.63, CrCl 49 ml/min CG (N54) WBC bumped up 6.3--> 40.8 Tm 101.8 9/2: CXR: New left mid lung airspace opacity, likely PNA or aspiration  Plan: Vancomycin 1g IV x1 in ED, then '500mg'$  IV q12h Zosyn 3.375g IV q8h (infuse over 4 hours) F/u renal function, VT at Css, cultures, clinical course     Temp (24hrs), Avg:97.9 F (36.6 C), Min:97.9 F (36.6 C), Max:97.9 F (36.6 C)   Recent Labs Lab 06/18/16 2002 06/18/16 2020 06/19/16 0002 06/19/16 0625 06/20/16 0510 06/22/16 1551  WBC 4.1  --   --  1.5* 6.3  --   CREATININE 0.68  --   --  0.62 0.57  --   LATICACIDVEN  --  1.13 0.69  --   --  3.29*    Estimated Creatinine Clearance: 48.5 mL/min (by C-G formula based on SCr of 0.8 mg/dL).    Allergies  Allergen Reactions  . Shellfish Allergy Swelling    Antimicrobials this admission: 9/2 Vancomycin  >>  9/2 Cefepime x1 9/2 Zosyn >>  Dose adjustments this admission:  Microbiology results: 9/2 BCx: collected 9/2 UCx: ordered   Thank you for allowing pharmacy to be a part of this patient's care.  Ralene Bathe, PharmD, BCPS 06/22/2016, 4:01 PM  Pager: [phone]

## 2016-06-22 NOTE — Progress Notes (Signed)
PT requested to be removed from BiPAP. RT removed PT from BiPAP and placed on 3 lpm Time (as at home). PT states she is breathing ok at this time. Current Sp02 97%, RR 20, BS diminished. PT does appear to be slightly SOB at this time. RN in room when BiPAP removed. PT is alert and oriented at this time.

## 2016-06-22 NOTE — H&P (Signed)
TRH H&P   Patient Demographics:    Destiny Harrison, is a 75 y.o. female  MRN: [id number]   DOB - 09/17/1941  Admit Date - 06/22/2016  Outpatient Primary MD for the patient is Thressa Sheller, MD  Referring MD/NP/PA: Dr. Verta Ellen  Outpatient Specialists: Oncology Dr. Earlie Server  Patient coming from: Home  Chief Complaint  Patient presents with  . Shortness of Breath  . Lung Cancer      HPI:    Destiny Harrison  is a 75 y.o. female,with medical history significant formetastatic non-small cell lung cancer, COPD with chronic hypoxic respiratory failure, chronic pain, and GERD , who was discharged yesterday with a long hospital due to sepsis from UTI, presents today with complaints of shortness of breath and cough, and reports symptoms started today after breakfast, reports she was eating breakfast, full choking sensation while she was eating her eggs this morning after that developed dyspnea, cough. - in ED, patient is hypoxic in respiratory distress requiring BiPAP, chest x-ray with significant left mid lung opacity, tachypnea, tachycardic, with elevated lactic acid and significant leukocytosis, received IV vancomycin and cefepime    Review of systems:    In addition to the HPI above,  Febrile NAD No Headache, No changes with Vision or hearing, Ports problem with choking on breakfast this morning Planes of reproducible chest pain on palpation for a few months now, reports dyspnea and cough No Abdominal pain, No Nausea or Vommitting, Bowel movements are regular, No Blood in stool or Urine, No dysuria, No new skin rashes or bruises, No new joints pains-aches,  No new weakness, tingling, numbness in any extremity, report generalized weakness and fatigue Reports poor appetite and weight loss No polyuria, polydypsia or polyphagia, No significant Mental Stressors.  A full 10 point Review  of Systems was done, except as stated above, all other Review of Systems were negative.   With Past History of the following :    Past Medical History:  Diagnosis Date  . Asthma   . COPD (chronic obstructive pulmonary disease) ([place])   . DNR (do not resuscitate) 06/26/2015  . Dysphagia 12/07/2015  . Encounter for antineoplastic immunotherapy 05/22/2015  . Hiatal hernia   . History of chemotherapy   . History of radiation therapy 03/06/2012   left hilum  . History of radiation therapy 05/10/2013-05/31/2013   Left lung/ 33/75'@2'$ .25 per fraction x 15 fractions  . Hyperlipidemia   . Neutropenia, drug-induced ([place]) 05/05/2012  . Non-small cell lung cancer ([place]) dx'd 08/28/11   left lung  . Primary cancer of right upper lobe of lung ([place]) 02/06/2012  . Radiation 11/15/13-11/26/13   Right hilum 30 Gy in 10 fractions  . Rheumatoid arthritis(714.0)       Past Surgical History:  Procedure Laterality Date  . appendex  1962  . surgery on right wrist    . VIDEO BRONCHOSCOPY  01/28/2012   Procedure:  VIDEO BRONCHOSCOPY WITHOUT FLUORO;  Surgeon: Brand Males, MD;  Location: [place];  Service: Endoscopy;  Laterality: Bilateral;      Social History:     Social History  Substance Use Topics  . Smoking status: Former Smoker    Packs/day: 0.50    Years: 40.00    Types: Cigarettes    Quit date: 10/29/2009  . Smokeless tobacco: Never Used  . Alcohol use No     Lives - At home  Mobility - with assistance    Family History :     Family History  Problem Relation Age of Onset  . Diabetes Mother     insulin dependent  . Breast cancer Mother       Home Medications:   Prior to Admission medications   Medication Sig Start Date End Date Taking? Authorizing Provider  albuterol (PROAIR HFA) 108 (90 BASE) MCG/ACT inhaler Inhale 2 puffs into the lungs every 6 (six) hours as needed for wheezing or shortness of breath. 08/31/15   Brand Males, MD  Ascorbic Acid (VITAMIN C PO) Take 1  tablet by mouth daily.    Historical Provider, MD  benzonatate (TESSALON) 100 MG capsule Take 100 mg by mouth every 8 (eight) hours as needed for cough.  01/05/16   Historical Provider, MD  budesonide (PULMICORT) 0.25 MG/2ML nebulizer solution Take 2 mLs (0.25 mg total) by nebulization 2 (two) times daily. 08/20/15   Belkys A Regalado, MD  calcium-vitamin D (OSCAL WITH D) 500-200 MG-UNIT per tablet Take 1 tablet by mouth daily.    Historical Provider, MD  cephALEXin (KEFLEX) 500 MG capsule Take 1 capsule (500 mg total) by mouth 2 (two) times daily. 06/21/16 06/29/16  Silver Huguenin Elgergawy, MD  Cholecalciferol (VITAMIN D PO) Take 1 tablet by mouth daily.    Historical Provider, MD  dexamethasone (DECADRON) 4 MG tablet Take 8 mg by mouth 2 (two) times daily. The day before, of, and the day after chemo 04/09/16   Historical Provider, MD  diltiazem (DILACOR XR) 240 MG 24 hr capsule Take 1 capsule (240 mg total) by mouth every evening. 06/06/16   Lavina Hamman, MD  famotidine (PEPCID) 20 MG tablet Take 1 tablet (20 mg total) by mouth 2 (two) times daily. 06/06/16   Lavina Hamman, MD  folic acid (FOLVITE) 416 MCG tablet Take 400 mcg by mouth daily.    Historical Provider, MD  furosemide (LASIX) 20 MG tablet Take 20 mg by mouth daily.  04/30/16   Historical Provider, MD  guaiFENesin (MUCINEX) 600 MG 12 hr tablet Take 1 tablet (600 mg total) by mouth 2 (two) times daily. Please start in 3 days. 06/24/16   Silver Huguenin Elgergawy, MD  HYDROcodone-acetaminophen (NORCO/VICODIN) 5-325 MG tablet Take 1 tablet by mouth every 4 (four) hours as needed for moderate pain. 06/13/16   Curt Bears, MD  IRON PO Take 1 tablet by mouth daily.    Historical Provider, MD  lactose free nutrition (BOOST PLUS) LIQD Take 237 mLs by mouth 2 (two) times daily between meals. 06/06/16   Lavina Hamman, MD  magnesium oxide (MAG-OX) 400 (241.3 Mg) MG tablet Take 1 tablet (400 mg total) by mouth 2 (two) times daily. 02/18/16   Maryann Mikhail, DO    metoCLOPramide (REGLAN) 10 MG tablet Take 10 mg by mouth every 6 (six) hours as needed for nausea or vomiting.  03/29/16   Historical Provider, MD  morphine (MS CONTIN) 15 MG 12 hr tablet Take 1 tablet (15  mg total) by mouth every 12 (twelve) hours. 05/23/16   Curt Bears, MD  ondansetron (ZOFRAN-ODT) 8 MG disintegrating tablet DISSOLVE 1 TABLET BY MOUTH EVERY 8 HOURS AS NEEDED FOR NAUSEA AND VOMITING 04/04/16   Curt Bears, MD  OXYGEN Inhale 2 L into the lungs continuous.    Historical Provider, MD  pantoprazole (PROTONIX) 40 MG tablet Take 80 mg by mouth daily.  05/28/16   Historical Provider, MD  polyethylene glycol (MIRALAX / GLYCOLAX) packet MX AND DRK 1 PACKET PO QD MIXED WITH 8 OUNCES OF FLUID 02/22/16   Historical Provider, MD  potassium chloride SA (K-DUR,KLOR-CON) 20 MEQ tablet Take 1 tablet (20 mEq total) by mouth daily. 05/16/16   Wyatt Portela, MD  predniSONE (DELTASONE) 10 MG tablet Take '50mg'$  daily for 3days,Take '40mg'$  daily for 3days,Take '30mg'$  daily for 3days,Take '20mg'$  daily for 3days,Take '10mg'$  daily for 3days, then resume 5 mg daily. 06/21/16   Silver Huguenin Elgergawy, MD  predniSONE (DELTASONE) 5 MG tablet Take 1 tablet (5 mg total) by mouth daily with breakfast. 07/05/16   Albertine Patricia, MD  simvastatin (ZOCOR) 10 MG tablet Take 10 mg by mouth daily.    Historical Provider, MD     Allergies:     Allergies  Allergen Reactions  . Shellfish Allergy Swelling     Physical Exam:   Vitals  Blood pressure 132/76, pulse (!) 142, temperature 101.8 F (38.8 C), temperature source Rectal, resp. rate 25, height '5\' 5"'$  (1.651 m), weight 51.4 kg (113 lb 4.8 oz), SpO2 96 %.   1. General Frail, elderly thin-appearing female laying in bed in mild respiratory distress on BiPAP   2. Normal affect and insight, Awake Alert, Oriented X 3.  3. No F.N deficits, ALL C.Nerves Intact, Strength 5/5 all 4 extremities, Sensation intact all 4 extremities, Plantars down going.  4. Ears and Eyes appear  Normal, Conjunctivae clear, PERRLA. Moist Oral Mucosa.  5. Supple Neck, No JVD, No cervical lymphadenopathy appriciated, No Carotid Bruits.  6. Symmetrical Chest wall movement, fair air movement bilaterally, no wheezing, mild use of accessory muscles  7. Tachycardic, No Gallops, Rubs or Murmurs, No Parasternal Heave. Agent has reproducible muscular skeletal chest pain and left side  8. Positive Bowel Sounds, Abdomen Soft, No tenderness, No organomegaly appriciated,No rebound -guarding or rigidity.  9.  No Cyanosis, Normal Skin Turgor, No Skin Rash or Bruise.  10. Good muscle tone,  joints appear normal , no effusions, Normal ROM.      Data Review:    CBC  Recent Labs Lab 06/18/16 2002 06/19/16 0625 06/20/16 0510 06/22/16 1539  WBC 4.1 1.5* 6.3 40.8*  HGB 9.3* 8.9* 8.4* 8.7*  HCT 28.2* 27.4* 25.6* 26.7*  PLT 202 166 163 192  MCV 80.6 79.2 79.8 79.5  MCH 26.6 25.7* 26.2 25.9*  MCHC 33.0 32.5 32.8 32.6  RDW 20.1* 19.6* 19.8* 20.0*  LYMPHSABS 0.5* 0.2* 0.3* 0.8  MONOABS 0.3 0.2 1.0 2.0*  EOSABS 0.0 0.0 0.0 0.0  BASOSABS 0.0 0.0 0.0 0.0   ------------------------------------------------------------------------------------------------------------------  Chemistries   Recent Labs Lab 06/18/16 2002 06/19/16 0012 06/19/16 0625 06/20/16 0510 06/22/16 1539  NA 134*  --  134* 139 140  K 2.8*  --  3.3* 4.8 4.3  CL 93*  --  96* 104 100*  CO2 32  --  30 31 32  GLUCOSE 86  --  103* 152* 159*  BUN 20  --  '15 17 16  '$ CREATININE 0.68  --  0.62  0.57 0.63  CALCIUM 8.1*  --  7.7* 8.2* 9.4  MG  --  1.0*  --  2.4  --   AST 13*  --   --   --  17  ALT 13*  --   --   --  18  ALKPHOS 78  --   --   --  146*  BILITOT 1.4*  --   --   --  0.8   ------------------------------------------------------------------------------------------------------------------ estimated creatinine clearance is 48.5 mL/min (by C-G formula based on SCr of 0.8  mg/dL). ------------------------------------------------------------------------------------------------------------------ No results for input(s): TSH, T4TOTAL, T3FREE, THYROIDAB in the last 72 hours.  Invalid input(s): FREET3  Coagulation profile  Recent Labs Lab 06/19/16 0012  INR 1.06   -------------------------------------------------------------------------------------------------------------------  Recent Labs  06/22/16 1539  DDIMER 0.87*   -------------------------------------------------------------------------------------------------------------------  Cardiac Enzymes No results for input(s): CKMB, TROPONINI, MYOGLOBIN in the last 168 hours.  Invalid input(s): CK ------------------------------------------------------------------------------------------------------------------    Component Value Date/Time   BNP 388.1 (H) 06/22/2016 1539     ---------------------------------------------------------------------------------------------------------------  Urinalysis    Component Value Date/Time   COLORURINE YELLOW 06/18/2016 1920   APPEARANCEUR CLOUDY (A) 06/18/2016 1920   LABSPEC 1.011 06/18/2016 1920   PHURINE 8.0 06/18/2016 1920   GLUCOSEU NEGATIVE 06/18/2016 1920   HGBUR NEGATIVE 06/18/2016 1920   BILIRUBINUR NEGATIVE 06/18/2016 1920   KETONESUR NEGATIVE 06/18/2016 1920   PROTEINUR NEGATIVE 06/18/2016 1920   UROBILINOGEN 0.2 08/18/2015 0730   NITRITE POSITIVE (A) 06/18/2016 1920   LEUKOCYTESUR MODERATE (A) 06/18/2016 1920    ----------------------------------------------------------------------------------------------------------------   Imaging Results:    Dg Chest Portable 1 View  Result Date: 06/22/2016 CLINICAL DATA:  Acute shortness of breath today. History of left lung small cell carcinoma. EXAM: PORTABLE CHEST 1 VIEW COMPARISON:  06/18/2016 FINDINGS: Upper limits normal heart size noted. New left mid lung airspace disease is identified.  Scarring/ posttreatment changes in the left perihilar region again noted. There is no evidence of pneumothorax or pleural effusion. No acute bony abnormalities are present. A right Port-A-Cath with tip overlying the mid -lower SVC again noted. IMPRESSION: New left mid lung airspace opacity, likely pneumonia or aspiration. No other significant change. Electronically Signed   By: Margarette Canada M.D.   On: 06/22/2016 16:06      Assessment & Plan:    Active Problems:   COPD, moderate ([place])   Primary cancer of right upper lobe of lung ([facility])   Dysphagia   Protein-calorie malnutrition, severe   Sepsis ([place])   Acute on chronic hypoxic respiratory failure - She is on baseline 3 L nasal cannula, continue to COPD disease and lung cancer. - Worsening secondary to episode of aspiration pneumonia  Sepsis secondary to aspiration pneumonia - Meet sepsis criteria on admission giving tachypnea, fever, tachycardia, and leukocytosis, with elevated lactic acid and - trend lactic acid and pro-calcitonin  Aspiration pneumonia - She reports her symptoms starting today after she was having breakfast eating egg, will she felt choking on it, chest x-ray significant for new left mid lung opacity. - Continue with IV vancomycin and Zosyn - Follow on blood cultures  Dysphagia - We'll keep nothing by mouth, consult SLP  Chronic muscular skeletal chest pain - Patient with reproducible left-sided chest pain, chronic  Metastatic NSCLC  - Followed by Dr. Julien Nordmann of oncology in the outpatient setting   COPD - Discharged recently on prednisone taper, start on IV Solu-Medrol, continue with nebs, continue with Pulmicort.  Normocytic anemia  - Attributed to chemotherapy and managed  with pegfilgrastim, last given on 06/15/16   GERD  - PPI when able to swallow  Chronic pain  - Attributed to OA and cancer,  - Continue home regimen of long-acting morphine with Norco prn breakthrough pain unable to take oral,  meanwhile will keep on when necessary IV morphine  Protein calorie malnutrition - Resume supplement after cleared by SLP    DVT Prophylaxis Heparin   AM Labs Ordered, also please review Full Orders  Family Communication: Admission, patients condition and plan of care including tests being ordered have been discussed with the patient and Husband and daughter who indicate understanding and agree with the plan and Code Status.  Code Status DNR, Confirmed by patient, husband and daughter at bedside  Likely DC to  Home  Condition GUARDED    Consults called: None  Admission status: inpatient  Time spent in minutes : 65 minutes   ELGERGAWY, DAWOOD M.D on 06/22/2016 at 6:12 PM  Between 7am to 7pm - Pager - [phone]. After 7pm go to [email or web address] - password [facility]  [facility] - Office  [phone]

## 2016-06-22 NOTE — ED Provider Notes (Signed)
[facility] DEPT Provider Note   CSN: [id number] Arrival date & time: 06/22/16  1438     History   Chief Complaint Chief Complaint  Patient presents with  . Shortness of Breath  . Lung Cancer    HPI Destiny Harrison is a 75 y.o. female presenting with dyspnea. Patient has a history of COPD and lung cancer. Family provides most of the history. Patient was discharged from the hospital yesterday after being admitted for pneumonia. Patient was fine upon discharge and had been walking around the hallways. However at some point today she started developing shortness of breath again. Family has noticed that she seems to fall asleep easily as well. She is chronically on morphine and hydrocodone. Patient states she has some cough but not as bad as when she was admitted last week. She also is having left-sided chest pain which she states she always gets whenever she has these shortness of breath exacerbations.   HPI  Past Medical History:  Diagnosis Date  . Asthma   . COPD (chronic obstructive pulmonary disease) ([place])   . DNR (do not resuscitate) 06/26/2015  . Dysphagia 12/07/2015  . Encounter for antineoplastic immunotherapy 05/22/2015  . Hiatal hernia   . History of chemotherapy   . History of radiation therapy 03/06/2012   left hilum  . History of radiation therapy 05/10/2013-05/31/2013   Left lung/ 33/75'@2'$ .25 per fraction x 15 fractions  . Hyperlipidemia   . Neutropenia, drug-induced ([place]) 05/05/2012  . Non-small cell lung cancer ([place]) dx'd 08/28/11   left lung  . Primary cancer of right upper lobe of lung ([place]) 02/06/2012  . Radiation 11/15/13-11/26/13   Right hilum 30 Gy in 10 fractions  . Rheumatoid arthritis(714.0)     Patient Active Problem List   Diagnosis Date Noted  . Sepsis secondary to UTI ([place]) 06/19/2016  . Pressure ulcer 06/19/2016  . Sepsis ([place]) 05/31/2016  . Immunocompromised state ([place]) 05/31/2016  . Protein-calorie malnutrition, severe 05/30/2016  . Chemotherapy-induced  enteritis 05/29/2016  . Chronic respiratory failure with hypoxia ([place]) 05/29/2016  . Antineoplastic chemotherapy induced anemia 05/29/2016  . Nausea & vomiting 05/28/2016  . Hypokalemia 05/28/2016  . Port catheter in place 05/07/2016  . Dehydration 05/07/2016  . Encounter for antineoplastic chemotherapy 04/11/2016  . Chronic pain 12/21/2015  . Dysphagia 12/07/2015  . HCAP (healthcare-associated pneumonia) 08/18/2015  . Palliative care encounter   . DNR (do not resuscitate) 06/26/2015  . Sinus tachycardia ([place]) 06/26/2015  . Encounter for antineoplastic immunotherapy 05/22/2015  . COPD exacerbation ([place]) 04/22/2015  . Fatigue 02/28/2015  . UTI (lower urinary tract infection) 06/24/2014  . Chronic cough 02/06/2014  . Primary cancer of right upper lobe of lung ([place]) 02/06/2012  . COPD, moderate ([place]) 11/18/2011  . Dyspnea 11/04/2011    Past Surgical History:  Procedure Laterality Date  . appendex  1962  . surgery on right wrist    . VIDEO BRONCHOSCOPY  01/28/2012   Procedure: VIDEO BRONCHOSCOPY WITHOUT FLUORO;  Surgeon: Brand Males, MD;  Location: [facility] ENDOSCOPY;  Service: Endoscopy;  Laterality: Bilateral;    OB History    No data available       Home Medications    Prior to Admission medications   Medication Sig Start Date End Date Taking? Authorizing Provider  albuterol (PROAIR HFA) 108 (90 BASE) MCG/ACT inhaler Inhale 2 puffs into the lungs every 6 (six) hours as needed for wheezing or shortness of breath. 08/31/15   Brand Males, MD  Ascorbic Acid (VITAMIN C PO) Take  1 tablet by mouth daily.    Historical Provider, MD  benzonatate (TESSALON) 100 MG capsule Take 100 mg by mouth every 8 (eight) hours as needed for cough.  01/05/16   Historical Provider, MD  budesonide (PULMICORT) 0.25 MG/2ML nebulizer solution Take 2 mLs (0.25 mg total) by nebulization 2 (two) times daily. 08/20/15   Belkys A Regalado, MD  calcium-vitamin D (OSCAL WITH D) 500-200 MG-UNIT per tablet  Take 1 tablet by mouth daily.    Historical Provider, MD  cephALEXin (KEFLEX) 500 MG capsule Take 1 capsule (500 mg total) by mouth 2 (two) times daily. 06/21/16 06/29/16  Silver Huguenin Elgergawy, MD  Cholecalciferol (VITAMIN D PO) Take 1 tablet by mouth daily.    Historical Provider, MD  dexamethasone (DECADRON) 4 MG tablet Take 8 mg by mouth 2 (two) times daily. The day before, of, and the day after chemo 04/09/16   Historical Provider, MD  diltiazem (DILACOR XR) 240 MG 24 hr capsule Take 1 capsule (240 mg total) by mouth every evening. 06/06/16   Lavina Hamman, MD  famotidine (PEPCID) 20 MG tablet Take 1 tablet (20 mg total) by mouth 2 (two) times daily. 06/06/16   Lavina Hamman, MD  folic acid (FOLVITE) 951 MCG tablet Take 400 mcg by mouth daily.    Historical Provider, MD  furosemide (LASIX) 20 MG tablet Take 20 mg by mouth daily.  04/30/16   Historical Provider, MD  guaiFENesin (MUCINEX) 600 MG 12 hr tablet Take 1 tablet (600 mg total) by mouth 2 (two) times daily. Please start in 3 days. 06/24/16   Silver Huguenin Elgergawy, MD  HYDROcodone-acetaminophen (NORCO/VICODIN) 5-325 MG tablet Take 1 tablet by mouth every 4 (four) hours as needed for moderate pain. 06/13/16   Curt Bears, MD  IRON PO Take 1 tablet by mouth daily.    Historical Provider, MD  lactose free nutrition (BOOST PLUS) LIQD Take 237 mLs by mouth 2 (two) times daily between meals. 06/06/16   Lavina Hamman, MD  magnesium oxide (MAG-OX) 400 (241.3 Mg) MG tablet Take 1 tablet (400 mg total) by mouth 2 (two) times daily. 02/18/16   Maryann Mikhail, DO  metoCLOPramide (REGLAN) 10 MG tablet Take 10 mg by mouth every 6 (six) hours as needed for nausea or vomiting.  03/29/16   Historical Provider, MD  morphine (MS CONTIN) 15 MG 12 hr tablet Take 1 tablet (15 mg total) by mouth every 12 (twelve) hours. 05/23/16   Curt Bears, MD  ondansetron (ZOFRAN-ODT) 8 MG disintegrating tablet DISSOLVE 1 TABLET BY MOUTH EVERY 8 HOURS AS NEEDED FOR NAUSEA AND VOMITING  04/04/16   Curt Bears, MD  OXYGEN Inhale 2 L into the lungs continuous.    Historical Provider, MD  pantoprazole (PROTONIX) 40 MG tablet Take 80 mg by mouth daily.  05/28/16   Historical Provider, MD  polyethylene glycol (MIRALAX / GLYCOLAX) packet MX AND DRK 1 PACKET PO QD MIXED WITH 8 OUNCES OF FLUID 02/22/16   Historical Provider, MD  potassium chloride SA (K-DUR,KLOR-CON) 20 MEQ tablet Take 1 tablet (20 mEq total) by mouth daily. 05/16/16   Wyatt Portela, MD  predniSONE (DELTASONE) 10 MG tablet Take '50mg'$  daily for 3days,Take '40mg'$  daily for 3days,Take '30mg'$  daily for 3days,Take '20mg'$  daily for 3days,Take '10mg'$  daily for 3days, then resume 5 mg daily. 06/21/16   Silver Huguenin Elgergawy, MD  predniSONE (DELTASONE) 5 MG tablet Take 1 tablet (5 mg total) by mouth daily with breakfast. 07/05/16   Silver Huguenin  Elgergawy, MD  simvastatin (ZOCOR) 10 MG tablet Take 10 mg by mouth daily.    Historical Provider, MD    Family History Family History  Problem Relation Age of Onset  . Diabetes Mother     insulin dependent  . Breast cancer Mother     Social History Social History  Substance Use Topics  . Smoking status: Former Smoker    Packs/day: 0.50    Years: 40.00    Types: Cigarettes    Quit date: 10/29/2009  . Smokeless tobacco: Never Used  . Alcohol use No     Allergies   Shellfish allergy   Review of Systems Review of Systems  Constitutional: Negative for fever.  Respiratory: Positive for cough and shortness of breath.   Cardiovascular: Positive for chest pain.  Neurological: Positive for weakness.  All other systems reviewed and are negative.    Physical Exam Updated Vital Signs BP 132/76 (BP Location: Right Arm)   Pulse (!) 142   Temp 101.8 F (38.8 C) (Rectal)   Resp 25   Ht '5\' 5"'$  (1.651 m)   Wt 113 lb 4.8 oz (51.4 kg)   SpO2 96%   BMI 18.85 kg/m   Physical Exam  Constitutional: She is oriented to person, place, and time. She appears well-developed and well-nourished.    Patient is awake, alert, tachypneic. Intermittently seems to fall asleep and respirations become shallow for a minute or less and then she goes back to being tachypneic  HENT:  Head: Normocephalic and atraumatic.  Right Ear: External ear normal.  Left Ear: External ear normal.  Nose: Nose normal.  Eyes: Right eye exhibits no discharge. Left eye exhibits no discharge.  Cardiovascular: Regular rhythm and normal heart sounds.  Tachycardia present.   Pulmonary/Chest: Effort normal. Tachypnea noted. She has decreased breath sounds in the right lower field and the left lower field. She has wheezes (mild expiratory).  Abdominal: Soft. There is no tenderness.  Neurological: She is alert and oriented to person, place, and time.  Skin: Skin is warm and dry.  Nursing note and vitals reviewed.    ED Treatments / Results  Labs (all labs ordered are listed, but only abnormal results are displayed) Labs Reviewed  BLOOD GAS, ARTERIAL - Abnormal; Notable for the following:       Result Value   pH, Arterial 7.458 (*)    pO2, Arterial 181 (*)    Bicarbonate 28.2 (*)    Acid-Base Excess 4.5 (*)    All other components within normal limits  COMPREHENSIVE METABOLIC PANEL - Abnormal; Notable for the following:    Chloride 100 (*)    Glucose, Bld 159 (*)    Total Protein 6.0 (*)    Albumin 2.8 (*)    Alkaline Phosphatase 146 (*)    All other components within normal limits  BRAIN NATRIURETIC PEPTIDE - Abnormal; Notable for the following:    B Natriuretic Peptide 388.1 (*)    All other components within normal limits  CBC WITH DIFFERENTIAL/PLATELET - Abnormal; Notable for the following:    WBC 40.8 (*)    RBC 3.36 (*)    Hemoglobin 8.7 (*)    HCT 26.7 (*)    MCH 25.9 (*)    RDW 20.0 (*)    Neutro Abs 38.0 (*)    Monocytes Absolute 2.0 (*)    All other components within normal limits  D-DIMER, QUANTITATIVE (NOT AT [facility]) - Abnormal; Notable for the following:    D-Dimer, Quant 0.87 (*)  All  other components within normal limits  I-STAT CG4 LACTIC ACID, ED - Abnormal; Notable for the following:    Lactic Acid, Venous 3.29 (*)    All other components within normal limits  CULTURE, BLOOD (ROUTINE X 2)  CULTURE, BLOOD (ROUTINE X 2)  URINE CULTURE  URINALYSIS, ROUTINE W REFLEX MICROSCOPIC (NOT AT [facility])  Randolm Idol, ED    EKG  EKG Interpretation None       Radiology Dg Chest Portable 1 View  Result Date: 06/22/2016 CLINICAL DATA:  Acute shortness of breath today. History of left lung small cell carcinoma. EXAM: PORTABLE CHEST 1 VIEW COMPARISON:  06/18/2016 FINDINGS: Upper limits normal heart size noted. New left mid lung airspace disease is identified. Scarring/ posttreatment changes in the left perihilar region again noted. There is no evidence of pneumothorax or pleural effusion. No acute bony abnormalities are present. A right Port-A-Cath with tip overlying the mid -lower SVC again noted. IMPRESSION: New left mid lung airspace opacity, likely pneumonia or aspiration. No other significant change. Electronically Signed   By: Margarette Canada M.D.   On: 06/22/2016 16:06    Procedures Procedures (including critical care time)  Medications Ordered in ED Medications  sodium chloride 0.9 % bolus 1,000 mL (1,000 mLs Intravenous New Bag/Given 06/22/16 1635)    And  sodium chloride 0.9 % bolus 500 mL (500 mLs Intravenous New Bag/Given 06/22/16 1642)    And  sodium chloride 0.9 % bolus 250 mL (not administered)  vancomycin (VANCOCIN) IVPB 1000 mg/200 mL premix (1,000 mg Intravenous New Bag/Given 06/22/16 1651)  sodium chloride 0.9 % bolus 1,000 mL (0 mLs Intravenous Stopped 06/22/16 1642)  albuterol (PROVENTIL) (2.5 MG/3ML) 0.083% nebulizer solution 5 mg (5 mg Nebulization Given 06/22/16 1619)  ipratropium (ATROVENT) nebulizer solution 0.5 mg (0.5 mg Nebulization Given 06/22/16 1619)  ceFEPIme (MAXIPIME) 2 g in dextrose 5 % 50 mL IVPB (2 g Intravenous New Bag/Given 06/22/16 1617)    acetaminophen (TYLENOL) suppository 650 mg (650 mg Rectal Given 06/22/16 1703)     Initial Impression / Assessment and Plan / ED Course  I have reviewed the triage vital signs and the nursing notes.  Pertinent labs & imaging results that were available during my care of the patient were reviewed by me and considered in my medical decision making (see chart for details).  Clinical Course  Comment By Time  Will eval with CXR, labs, ECG. There is some wheezing. While she is tachycardic, I think with her dyspnea and wheezing she still need some. Given the sleepiness will put on bipap and get ABG. Sherwood Gambler, MD 09/02 1553  Given hx of cancer, will CTA with mildly elevated ddimer. Dr. Landis Gandy to admit. Sherwood Gambler, MD 09/02 1708    Patient has improved with BiPAP. Given IV fluids per sepsis protocol given elevated lactic acid, fever, and pneumonia. Admit to the step down unit. Respiratory status seems to be improving.  Final Clinical Impressions(s) / ED Diagnoses   Final diagnoses:  HCAP (healthcare-associated pneumonia)  Sepsis, due to unspecified organism [facility])    New Prescriptions New Prescriptions   No medications on file     Sherwood Gambler, MD 06/23/16 [phone]

## 2016-06-22 NOTE — ED Triage Notes (Signed)
She states she was released from hospital yesterday; and became very short of breath today "and I can't stand it".

## 2016-06-22 NOTE — ED Notes (Signed)
Called floor for report, unable to take report at this time giving patient care.

## 2016-06-22 NOTE — ED Notes (Signed)
Per ICU Charge RN, pt to come to floor after the 1900 shift change.

## 2016-06-22 NOTE — ED Notes (Signed)
Bed: WA13 Expected date:  Expected time:  Means of arrival:  Comments: 

## 2016-06-23 ENCOUNTER — Encounter (HOSPITAL_COMMUNITY): Payer: Self-pay

## 2016-06-23 LAB — BASIC METABOLIC PANEL
ANION GAP: 7 (ref 5–15)
BUN: 11 mg/dL (ref 6–20)
CALCIUM: 8.3 mg/dL — AB (ref 8.9–10.3)
CO2: 30 mmol/L (ref 22–32)
Chloride: 102 mmol/L (ref 101–111)
Creatinine, Ser: 0.52 mg/dL (ref 0.44–1.00)
GFR calc Af Amer: >60 mL/min (ref >60)
GFR calc non Af Amer: >60 mL/min (ref >60)
GLUCOSE: 174 mg/dL — AB (ref 65–99)
Potassium: 3.9 mmol/L (ref 3.5–5.1)
Sodium: 139 mmol/L (ref 135–145)

## 2016-06-23 LAB — CBC
HEMATOCRIT: 24.7 % — AB (ref 36.0–46.0)
HEMOGLOBIN: 8.1 g/dL — AB (ref 12.0–15.0)
MCH: 26 pg (ref 26.0–34.0)
MCHC: 32.8 g/dL (ref 30.0–36.0)
MCV: 79.4 fL (ref 78.0–100.0)
Platelets: 162 10*3/uL (ref 150–400)
RBC: 3.11 MIL/uL — ABNORMAL LOW (ref 3.87–5.11)
RDW: 20.3 % — ABNORMAL HIGH (ref 11.5–15.5)
WBC: 28.7 10*3/uL — ABNORMAL HIGH (ref 4.0–10.5)

## 2016-06-23 LAB — CULTURE, BLOOD (ROUTINE X 2)
CULTURE: NO GROWTH
Culture: NO GROWTH

## 2016-06-23 LAB — MRSA PCR SCREENING: MRSA by PCR: NEGATIVE

## 2016-06-23 MED ORDER — METHYLPREDNISOLONE SODIUM SUCC 40 MG IJ SOLR
40.0000 mg | Freq: Three times a day (TID) | INTRAMUSCULAR | Status: DC
  Administered 2016-06-23 – 2016-06-25 (×6): 40 mg via INTRAVENOUS
  Filled 2016-06-23 (×6): qty 1

## 2016-06-23 MED ORDER — IPRATROPIUM-ALBUTEROL 0.5-2.5 (3) MG/3ML IN SOLN
3.0000 mL | Freq: Three times a day (TID) | RESPIRATORY_TRACT | Status: DC
  Administered 2016-06-23 – 2016-06-27 (×12): 3 mL via RESPIRATORY_TRACT
  Filled 2016-06-23 (×13): qty 3

## 2016-06-23 MED ORDER — LABETALOL HCL 5 MG/ML IV SOLN
20.0000 mg | Freq: Once | INTRAVENOUS | Status: AC
  Administered 2016-06-23: 20 mg via INTRAVENOUS
  Filled 2016-06-23: qty 4

## 2016-06-23 MED ORDER — MORPHINE SULFATE (PF) 2 MG/ML IV SOLN
1.0000 mg | INTRAVENOUS | Status: DC | PRN
  Administered 2016-06-23 – 2016-06-24 (×3): 2 mg via INTRAVENOUS
  Filled 2016-06-23 (×4): qty 1

## 2016-06-23 MED ORDER — IPRATROPIUM-ALBUTEROL 0.5-2.5 (3) MG/3ML IN SOLN
3.0000 mL | Freq: Four times a day (QID) | RESPIRATORY_TRACT | Status: DC
  Administered 2016-06-23: 3 mL via RESPIRATORY_TRACT
  Filled 2016-06-23: qty 3

## 2016-06-23 MED ORDER — ACETAMINOPHEN 325 MG PO TABS: 650.0000 mg | ORAL_TABLET | Freq: Four times a day (QID) | ORAL | Status: DC | PRN

## 2016-06-23 NOTE — Evaluation (Signed)
Clinical/Bedside Swallow Evaluation Patient Details  Name: Wealthy Danielski MRN: [id number] Date of Birth: 1941/04/29  Today's Date: 06/23/2016 Time: SLP Start Time (ACUTE ONLY): 9485 SLP Stop Time (ACUTE ONLY): 1625 SLP Time Calculation (min) (ACUTE ONLY): 20 min  Past Medical History:  Past Medical History:  Diagnosis Date  . Asthma   . COPD (chronic obstructive pulmonary disease) ([place])   . DNR (do not resuscitate) 06/26/2015  . Dysphagia 12/07/2015  . Encounter for antineoplastic immunotherapy 05/22/2015  . Hiatal hernia   . History of chemotherapy   . History of radiation therapy 03/06/2012   left hilum  . History of radiation therapy 05/10/2013-05/31/2013   Left lung/ 33/75'@2'$ .25 per fraction x 15 fractions  . Hyperlipidemia   . Neutropenia, drug-induced ([place]) 05/05/2012  . Non-small cell lung cancer ([place]) dx'd 08/28/11   left lung  . Primary cancer of right upper lobe of lung ([place]) 02/06/2012  . Radiation 11/15/13-11/26/13   Right hilum 30 Gy in 10 fractions  . Rheumatoid arthritis(714.0)    Past Surgical History:  Past Surgical History:  Procedure Laterality Date  . appendex  1962  . surgery on right wrist    . VIDEO BRONCHOSCOPY  01/28/2012   Procedure: VIDEO BRONCHOSCOPY WITHOUT FLUORO;  Surgeon: Brand Males, MD;  Location: [facility] ENDOSCOPY;  Service: Endoscopy;  Laterality: Bilateral;   HPI:  AnnaSmithis a 75 y.o.female,with medical history significant formetastatic non-small cell lung cancer, COPD with chronic hypoxic respiratory failure, chronic pain, hiatal hernia and GERD, who was discharged yesterday with a long hospital due to sepsis from UTI admitted with shortness of breath and cough following with full choking sensation with eggs 9/1. CXR new left mid lung airspace opacity, likely pneumonia or aspiration. Poor primary esophageal stripping wave on barium esophagram 05/07/16.   Assessment / Plan / Recommendation Clinical Impression  Pt exhibits a suspected primary esophageal  dysphagia with history of poor esophageal motility, GERD and hiatal hernia. Pt reports expecotration of po's at home intermittently. Munch chew pattern present with graham cracker due to report of ulcer on gum and tongue. No s/s aspiration. Breathing labored throughout assessment with fluctuating respiratory rate 23-40. Educated pt on importance of slow rate, small bites/sips, taking rest breaks, upright position, alternate liquids, pills whole in applesauce and solids and remain upright after meals. Recommend regular diet texture and pt states she will order softer textures (soup for dinner etc). ST follow up briefly for reiteration of education.       Aspiration Risk  Moderate aspiration risk    Diet Recommendation Regular;Thin liquid   Liquid Administration via: Cup (pt doesn't use straws) Medication Administration: Whole meds with puree Supervision: Patient able to self feed Compensations: Slow rate;Small sips/bites;Follow solids with liquid Postural Changes: Remain upright for at least 30 minutes after po intake;Seated upright at 90 degrees    Other  Recommendations Oral Care Recommendations: Oral care BID   Follow up Recommendations  None    Frequency and Duration min 1 x/week  1 week       Prognosis Prognosis for Safe Diet Advancement:  (fair-good)      Swallow Study   General HPI: AnnaSmithis a 75 y.o.female,with medical history significant formetastatic non-small cell lung cancer, COPD with chronic hypoxic respiratory failure, chronic pain, hiatal hernia and GERD, who was discharged yesterday with a long hospital due to sepsis from UTI admitted with shortness of breath and cough following with full choking sensation with eggs 9/1. CXR new left mid lung airspace opacity, likely pneumonia  or aspiration. Poor primary esophageal stripping wave on barium esophagram 05/07/16. Type of Study: Bedside Swallow Evaluation Previous Swallow Assessment:  (no) Diet Prior to this Study:  NPO Temperature Spikes Noted: No Respiratory Status: Nasal cannula History of Recent Intubation: No Behavior/Cognition: Alert;Cooperative;Pleasant mood Oral Cavity Assessment: Within Functional Limits Oral Care Completed by SLP: No Oral Cavity - Dentition: Dentures, bottom;Dentures, top Vision: Functional for self-feeding Self-Feeding Abilities: Able to feed self Patient Positioning: Upright in bed Baseline Vocal Quality: Normal Volitional Cough: Strong Volitional Swallow: Able to elicit    Oral/Motor/Sensory Function Overall Oral Motor/Sensory Function: Within functional limits   Ice Chips Ice chips: Not tested   Thin Liquid Thin Liquid: Within functional limits Presentation: Cup    Nectar Thick Nectar Thick Liquid: Not tested   Honey Thick Honey Thick Liquid: Not tested   Puree Puree: Within functional limits   Solid   GO   Solid: Impaired Oral Phase Functional Implications: Prolonged oral transit        Houston Siren 06/23/2016,4:38 PM  Orbie Pyo [place].Ed [employer] [phone]

## 2016-06-23 NOTE — Progress Notes (Signed)
PROGRESS NOTE                                                                                                                                                                                                             Patient Demographics:    Destiny Harrison, is a 75 y.o. female, DOB - [birth year], JAS:[id number]  Admit date - 06/22/2016   Admitting Physician Albertine Patricia, MD  Outpatient Primary MD for the patient is Thressa Sheller, MD  LOS - 1   Chief Complaint  Patient presents with  . Shortness of Breath  . Lung Cancer       Brief Narrative   75 y.o. female,with medical history significant formetastatic non-small cell lung cancer, COPD with chronic hypoxic respiratory failure, chronic pain, and GERD , recently discharged from [facility] hospital due to sepsis from UTI, admitted with shortness of breath and cough secondary to aspiration.   Subjective:    Destiny Harrison today has, No headache, Complaints of reproducible chest pain, No abdominal pain - No Nausea, reports cough, nonproductive  Assessment  & Plan :    Active Problems:   COPD, moderate ([place])   Primary cancer of right upper lobe of lung (H[facility])   Dysphagia   Protein-calorie malnutrition, severe   Sepsis ([place])   Acute on chronic hypoxic respiratory failure - She is on baseline 3 L nasal cannula, secondary to COPD disease and lung cancer. - Requiring BiPAP initially, significantly improved, back to baseline - CTA chest negative for PE  Sepsis secondary to aspiration pneumonia - Meet sepsis criteria on admission giving tachypnea, fever, tachycardia, and leukocytosis, with elevated lactic acid and - lactic acid trending down, pro-calcitonin within normal limits, leukocytosis trending down  Aspiration pneumonia - She reports her symptoms started  after she was having breakfast eating egg at home, will she felt choking on it, CT chest significant for left lower lobe opacity. -  Initially on vancomycin and Zosyn, discontinue vancomycin continue with Zosyn - Follow on blood cultures  Dysphagia - We'll keep nothing by mouth, consult SLP  Chronic muscular skeletal chest pain - Patient with reproducible left-sided chest pain, chronic  Metastatic NSCLC  - Followed by Dr. Julien Nordmann of oncology in the outpatient setting   COPD - Discharged recently on prednisone taper,  - started on IV Solu-Medrol, will start weaning ,continue  with nebs, continue with Pulmicort.  Normocytic anemia  - Attributed to chemotherapy and managed with pegfilgrastim, last given on 06/15/16   GERD  - PPI when able to swallow  Chronic pain  - Attributed to OA and cancer,  - Continue home regimen of long-acting morphine with Norco prn breakthrough pain unable to take oral, meanwhile will keep on when necessary IV morphine  Protein calorie malnutrition - Resume supplement after cleared by SLP       Code Status : DO NOT RESUSCITATE  Family Communication  : None at bedside  Disposition Plan  : Home when stable  Consults  :  none  Procedures  : none  DVT Prophylaxis  :  Heparin  Lab Results  Component Value Date   PLT 162 06/23/2016    Antibiotics  :    Anti-infectives    Start     Dose/Rate Route Frequency Ordered Stop   06/23/16 0600  vancomycin (VANCOCIN) 500 mg in sodium chloride 0.9 % 100 mL IVPB     500 mg 100 mL/hr over 60 Minutes Intravenous Every 12 hours 06/22/16 1744     06/22/16 2200  piperacillin-tazobactam (ZOSYN) IVPB 3.375 g     3.375 g 12.5 mL/hr over 240 Minutes Intravenous Every 8 hours 06/22/16 1745     06/22/16 1600  ceFEPIme (MAXIPIME) 2 g in dextrose 5 % 50 mL IVPB     2 g 100 mL/hr over 30 Minutes Intravenous  Once 06/22/16 1556 06/22/16 1730   06/22/16 1600  vancomycin (VANCOCIN) IVPB 1000 mg/200 mL premix     1,000 mg 200 mL/hr over 60 Minutes Intravenous  Once 06/22/16 1556 06/22/16 1828        Objective:   Vitals:    06/23/16 0500 06/23/16 0600 06/23/16 0700 06/23/16 0800  BP: 113/62 (!) 170/81 (!) 147/73 (!) 149/70  Pulse: (!) 113 (!) 116 (!) 108 (!) 112  Resp: 14 (!) '25 18 20  '$ Temp:    98.3 F (36.8 C)  TempSrc:    Oral  SpO2: 100% 98% 98% 100%  Weight:      Height:        Wt Readings from Last 3 Encounters:  06/22/16 50.4 kg (111 lb 1.8 oz)  06/21/16 51.4 kg (113 lb 4.8 oz)  06/13/16 50.4 kg (111 lb 1.6 oz)     Intake/Output Summary (Last 24 hours) at 06/23/16 1207 Last data filed at 06/23/16 0656  Gross per 24 hour  Intake           549.17 ml  Output                0 ml  Net           549.17 ml     Physical Exam  Awake Alert, Oriented X 3, Frail Supple Neck,No JVD Symmetrical Chest wall movement, Good air movement bilaterally, no wheezing Tachycardic,No Gallops,Rubs or new Murmurs, reducible midsternal chest pain on palpation +ve B.Sounds, Abd Soft, No tenderness,  No rebound - guarding or rigidity. No Cyanosis, Clubbing or edema, No new Rash or bruise     Data Review:    CBC  Recent Labs Lab 06/18/16 2002 06/19/16 0625 06/20/16 0510 06/22/16 1539 06/23/16 0430  WBC 4.1 1.5* 6.3 40.8* 28.7*  HGB 9.3* 8.9* 8.4* 8.7* 8.1*  HCT 28.2* 27.4* 25.6* 26.7* 24.7*  PLT 202 166 163 192 162  MCV 80.6 79.2 79.8 79.5 79.4  MCH 26.6 25.7* 26.2 25.9* 26.0  MCHC 33.0  32.5 32.8 32.6 32.8  RDW 20.1* 19.6* 19.8* 20.0* 20.3*  LYMPHSABS 0.5* 0.2* 0.3* 0.8  --   MONOABS 0.3 0.2 1.0 2.0*  --   EOSABS 0.0 0.0 0.0 0.0  --   BASOSABS 0.0 0.0 0.0 0.0  --     Chemistries   Recent Labs Lab 06/18/16 2002 06/19/16 0012 06/19/16 0625 06/20/16 0510 06/22/16 1539 06/23/16 0430  NA 134*  --  134* 139 140 139  K 2.8*  --  3.3* 4.8 4.3 3.9  CL 93*  --  96* 104 100* 102  CO2 32  --  30 31 32 30  GLUCOSE 86  --  103* 152* 159* 174*  BUN 20  --  '15 17 16 11  '$ CREATININE 0.68  --  0.62 0.57 0.63 0.52  CALCIUM 8.1*  --  7.7* 8.2* 9.4 8.3*  MG  --  1.0*  --  2.4  --   --   AST 13*  --    --   --  17  --   ALT 13*  --   --   --  18  --   ALKPHOS 78  --   --   --  146*  --   BILITOT 1.4*  --   --   --  0.8  --    ------------------------------------------------------------------------------------------------------------------ No results for input(s): CHOL, HDL, LDLCALC, TRIG, CHOLHDL, LDLDIRECT in the last 72 hours.  Lab Results  Component Value Date   HGBA1C 6.6 (H) 02/13/2016   ------------------------------------------------------------------------------------------------------------------ No results for input(s): TSH, T4TOTAL, T3FREE, THYROIDAB in the last 72 hours.  Invalid input(s): FREET3 ------------------------------------------------------------------------------------------------------------------ No results for input(s): VITAMINB12, FOLATE, FERRITIN, TIBC, IRON, RETICCTPCT in the last 72 hours.  Coagulation profile  Recent Labs Lab 06/19/16 0012 06/22/16 1800  INR 1.06 1.07     Recent Labs  06/22/16 1539  DDIMER 0.87*    Cardiac Enzymes No results for input(s): CKMB, TROPONINI, MYOGLOBIN in the last 168 hours.  Invalid input(s): CK ------------------------------------------------------------------------------------------------------------------    Component Value Date/Time   BNP 388.1 (H) 06/22/2016 1539    Inpatient Medications  Scheduled Meds: . heparin  5,000 Units Subcutaneous Q8H  . ipratropium-albuterol  3 mL Nebulization Q6H  . methylPREDNISolone (SOLU-MEDROL) injection  80 mg Intravenous Q8H  . piperacillin-tazobactam (ZOSYN)  IV  3.375 g Intravenous Q8H  . vancomycin  500 mg Intravenous Q12H   Continuous Infusions: . sodium chloride 50 mL/hr at 06/22/16 2301   PRN Meds:.acetaminophen, albuterol, morphine injection  Micro Results Recent Results (from the past 240 hour(s))  Urine culture     Status: Abnormal   Collection Time: 06/18/16  7:20 PM  Result Value Ref Range Status   Specimen Description URINE, CLEAN CATCH   Final   Special Requests NONE  Final   Culture MULTIPLE SPECIES PRESENT, SUGGEST RECOLLECTION (A)  Final   Report Status 06/20/2016 FINAL  Final  Culture, blood (Routine X 2)     Status: None (Preliminary result)   Collection Time: 06/18/16  8:07 PM  Result Value Ref Range Status   Specimen Description BLOOD PORTA CATH RIGHT  Final   Special Requests BOTTLES DRAWN AEROBIC AND ANAEROBIC 5 CC EACH  Final   Culture   Final    NO GROWTH 4 DAYS Performed at [facility]    Report Status PENDING  Incomplete  Culture, blood (Routine X 2)     Status: None (Preliminary result)   Collection Time: 06/18/16  8:11 PM  Result  Value Ref Range Status   Specimen Description BLOOD LEFT ANTECUBITAL  Final   Special Requests BOTTLES DRAWN AEROBIC AND ANAEROBIC 5 CC EACH  Final   Culture   Final    NO GROWTH 4 DAYS Performed at [facility]    Report Status PENDING  Incomplete  MRSA PCR Screening     Status: None   Collection Time: 06/19/16 12:57 AM  Result Value Ref Range Status   MRSA by PCR NEGATIVE NEGATIVE Final    Comment:        The GeneXpert MRSA Assay (FDA approved for NASAL specimens only), is one component of a comprehensive MRSA colonization surveillance program. It is not intended to diagnose MRSA infection nor to guide or monitor treatment for MRSA infections.   MRSA PCR Screening     Status: None   Collection Time: 06/22/16  9:29 PM  Result Value Ref Range Status   MRSA by PCR NEGATIVE NEGATIVE Final    Comment:        The GeneXpert MRSA Assay (FDA approved for NASAL specimens only), is one component of a comprehensive MRSA colonization surveillance program. It is not intended to diagnose MRSA infection nor to guide or monitor treatment for MRSA infections.     Radiology Reports Dg Chest 2 View  Result Date: 06/18/2016 CLINICAL DATA:  Shortness of breath, LEFT chest pain and fever this morning. History of COPD, lung cancer. EXAM: CHEST  2 VIEW  COMPARISON:  COPD and RIGHT hilar lymphadenopathy. No acute cardiopulmonary process. FINDINGS: Cardiac silhouette is normal. Fullness of the RIGHT hila corresponding to known mediastinal lymph nodes. No pleural effusion or focal consolidation. Increased lung volumes consistent with history of COPD. Single lumen RIGHT chest Port-A-Cath with distal tip projecting in proximal superior vena cava. High-riding RIGHT humeral head most compatible with old rotator cuff injury. Broad levoscoliosis. IMPRESSION: COPD and RIGHT hilar mass/lymphadenopathy without acute cardiopulmonary process. Electronically Signed   By: Elon Alas M.D.   On: 06/18/2016 20:41   Dg Chest 2 View  Result Date: 05/28/2016 CLINICAL DATA:  SOB increasing x 2 days - currently being treated for lung cancer - hx COPD - hx asthma EXAM: CHEST  2 VIEW COMPARISON:  05/07/2016 FINDINGS: The cardiac silhouette is normal in size. No mediastinal masses or convincing adenopathy. Coarse reticular type opacity extends from superior left hilum. There is fullness from the inferior right hilum. These findings stable. Lungs are hyperexpanded but otherwise clear. No pleural effusion or pneumothorax. Right anterior chest wall Port-A-Cath is stable. Bony thorax is demineralized. Mild compression fracture of a mid to upper thoracic vertebra, also stable. No convincing osteoblastic or osteolytic lesions. IMPRESSION: No acute cardiopulmonary disease. No significant change from the previous chest radiograph. Electronically Signed   By: Lajean Manes M.D.   On: 05/28/2016 14:49   Ct Chest W Contrast  Result Date: 06/11/2016 CLINICAL DATA:  Followup lung cancer. EXAM: CT CHEST, ABDOMEN, AND PELVIS WITH CONTRAST TECHNIQUE: Multidetector CT imaging of the chest, abdomen and pelvis was performed following the standard protocol during bolus administration of intravenous contrast. CONTRAST:  168m ISOVUE-300 IOPAMIDOL (ISOVUE-300) INJECTION 61% COMPARISON:  04/16/2016  FINDINGS: CT CHEST FINDINGS Cardiovascular: The heart size is normal. No pericardial effusion. There is aortic atherosclerosis. Calcification within the LAD, and left circumflex coronary artery noted. Mediastinum/Nodes: The trachea appears patent and is midline. Unremarkable appearance of the esophagus. Partially calcified anterior mediastinal, right paratracheal mass measures 3.4 x 3.4 cm, image 12 of series 5. This is  compared with 3.9 x 3.7 cm previously. Sub- carinal lymph node measures 7 mm, image 24 of series 5. On the previous exam this measured 10 mm. Lungs/Pleura: No pleural effusion. Advanced changes of centrilobular and paraseptal emphysema identified. Paramediastinal radiation change within the left lung is unchanged from previous exam. The perihilar right lung lesion measures 1.8 x 1.5 cm, image 30 of series 5. On the previous exam this measured 1.7 x 1.5 cm. Tiny nodule in the right lower lobe is unchanged measuring 2 mm, image 84 of series 4. Musculoskeletal: No aggressive lytic or sclerotic bone lesions identified. T4 compression deformity is stable from previous exam. CT ABDOMEN PELVIS FINDINGS Hepatobiliary: Stable hypervascular structure within the medial segment of left lobe of liver measuring 1 cm, image 59 of series 5. Stable 5 mm adjacent hypervascular lesion, image number 62 of series 5. No intrahepatic bile duct dilatation. The gallbladder appears normal. Pancreas: No inflammation or mass identified. Spleen: The spleen appears normal. Adrenals/Urinary Tract: The adrenal glands are normal. Unremarkable appearance of the right kidney. There is scarring involving the inferior pole of the right kidney. Urinary bladder is unremarkable. Stomach/Bowel: The stomach is normal. The small bowel loops have a normal course and caliber. The colon is negative. Vascular/Lymphatic: Calcified atherosclerotic disease involves the abdominal aorta. No aneurysm. No enlarged retroperitoneal or mesenteric  adenopathy. No enlarged pelvic or inguinal lymph nodes. Reproductive: The uterus and adnexal structures are unremarkable for patient's age. Other: There is no ascites or focal fluid collections within the abdomen or pelvis. Musculoskeletal: There is degenerative disc disease noted within the lumbar spine. 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 IMPRESSION: 1. No new or progressive metastatic disease. 2. Large anterior mediastinal right paratracheal lymph node is mildly decreased in size in the interval. There has been decrease in size of the sub- carinal lymph node. 3. Right perihilar soft tissue mass is stable to minimally increased in size in the interval. 4. Emphysema 5. Aortic atherosclerosis and coronary artery calcifications 6. Emphysema 7. Stable appearance of chronic T4 compression deformity and pagetoid disease involving the right femur. Electronically Signed   By: Kerby Moors M.D.   On: 06/11/2016 08:55   Ct Angio Chest Pe W/cm &/or Wo Cm  Result Date: 06/22/2016 CLINICAL DATA:  Worsening shortness of breath. Right lung carcinoma. COPD. EXAM: CT ANGIOGRAPHY CHEST WITH CONTRAST TECHNIQUE: Multidetector CT imaging of the chest was performed using the standard protocol during bolus administration of intravenous contrast. Multiplanar CT image reconstructions and MIPs were obtained to evaluate the vascular anatomy. CONTRAST:  100 mL Isovue 370 COMPARISON:  06/11/2016 FINDINGS: Cardiovascular: Satisfactory opacification of pulmonary arteries noted, and no pulmonary emboli identified. No evidence of thoracic aortic dissection or aneurysm. Normal heart size. Aortic atherosclerosis. Mediastinum/Lymph Nodes: Right paratracheal lymphadenopathy measures 3.4 x 3.4 cm on image 17/4 8 shows no significant change since previous study. Subcarinal lymph node measures 14 mm on image 35/4 compared with 7 mm previously. Lungs/Pleura: Mass in the central right perihilar region currently measures 2.0 x 1.8 cm  on image 45/4 compared to 1.5 x 1.8 cm previously. Right middle lobe atelectasis shows no significant change. Moderate emphysema is again demonstrated. Left lung paramediastinal radiation changes are again seen. New airspace disease is seen throughout the majority of the left lower lobe, suspicious for pneumonia. No evidence of pleural effusion. Upper abdomen: No acute findings. Musculoskeletal: No chest wall mass or suspicious bone lesions identified. Review of the MIP images confirms the above findings. IMPRESSION: No  evidence of pulmonary embolism. New left lower lobe airspace disease, suspicious for pneumonia. Slight increase in size of central right perihilar lung mass, with stable peripheral right middle lobe atelectasis. Mild increase in subcarinal mediastinal lymphadenopathy. Stable right paratracheal mediastinal lymphadenopathy. Emphysema. Electronically Signed   By: Earle Gell M.D.   On: 06/22/2016 19:26   Ct Angio Chest Pe W Or Wo Contrast  Result Date: 06/01/2016 CLINICAL DATA:  Hemoptysis EXAM: CT ANGIOGRAPHY CHEST WITH CONTRAST TECHNIQUE: Multidetector CT imaging of the chest was performed using the standard protocol during bolus administration of intravenous contrast. Multiplanar CT image reconstructions and MIPs were obtained to evaluate the vascular anatomy. CONTRAST:  100 cc Isovue 370 COMPARISON:  02/12/2016 FINDINGS: There are no filling defects in the pulmonary arterial tree to suggest acute pulmonary thromboembolism. Right peritracheal mass in the upper mediastinum measures 3.2 x 3.1 cm and previously measured 6.4 x 4.5 cm. Is improved. Ill-defined calcifications persist within the mass. Post radiation changes in the medial left lung are stable. Soft tissue prominence in the medial right middle lobe have improved. A focal ground-glass opacity in the lateral basal right lower lobe on image 63 measures 10 mm. Small pleural effusions have developed. Bilateral dependent atelectasis. Partial  solid nodule in the left lower lobe measures 5 mm on image 49. There is fluid or mucus material in left lower lobe airways extending to the posterior basal segment. See image 49. Severe emphysema persists. Interlobular septa are more prominent suggesting interstitial edema or pneumonia. Biapical scarring is stable. No pneumothorax. Right jugular Port-A-Cath is stable. Right ventricle is dilated. Diffuse hepatic steatosis. Calcified granuloma in the right lobe of the liver. Tiny hypodensities in the lateral spleen are nonspecific. Mid-level thoracic compression deformities are stable. This includes T4, T5, and probably T6. Review of the MIP images confirms the above findings. IMPRESSION: No evidence of acute pulmonary thromboembolism New small bilateral pleural effusions with dependent atelectasis New sub cm part solid left lower lobe nodule. New 10 mm ground-glass opacity in the right lower lobe. Central right middle lobe mass and large right paratracheal mass are improved. New interstitial changes suggesting interstitial edema or pneumonia. Electronically Signed   By: Marybelle Killings M.D.   On: 06/01/2016 09:49   Ct Abdomen Pelvis W Contrast  Result Date: 06/11/2016 CLINICAL DATA:  Followup lung cancer. EXAM: CT CHEST, ABDOMEN, AND PELVIS WITH CONTRAST TECHNIQUE: Multidetector CT imaging of the chest, abdomen and pelvis was performed following the standard protocol during bolus administration of intravenous contrast. CONTRAST:  159m ISOVUE-300 IOPAMIDOL (ISOVUE-300) INJECTION 61% COMPARISON:  04/16/2016 FINDINGS: CT CHEST FINDINGS Cardiovascular: The heart size is normal. No pericardial effusion. There is aortic atherosclerosis. Calcification within the LAD, and left circumflex coronary artery noted. Mediastinum/Nodes: The trachea appears patent and is midline. Unremarkable appearance of the esophagus. Partially calcified anterior mediastinal, right paratracheal mass measures 3.4 x 3.4 cm, image 12 of series 5.  This is compared with 3.9 x 3.7 cm previously. Sub- carinal lymph node measures 7 mm, image 24 of series 5. On the previous exam this measured 10 mm. Lungs/Pleura: No pleural effusion. Advanced changes of centrilobular and paraseptal emphysema identified. Paramediastinal radiation change within the left lung is unchanged from previous exam. The perihilar right lung lesion measures 1.8 x 1.5 cm, image 30 of series 5. On the previous exam this measured 1.7 x 1.5 cm. Tiny nodule in the right lower lobe is unchanged measuring 2 mm, image 84 of series 4. Musculoskeletal: No aggressive lytic or sclerotic  bone lesions identified. T4 compression deformity is stable from previous exam. CT ABDOMEN PELVIS FINDINGS Hepatobiliary: Stable hypervascular structure within the medial segment of left lobe of liver measuring 1 cm, image 59 of series 5. Stable 5 mm adjacent hypervascular lesion, image number 62 of series 5. No intrahepatic bile duct dilatation. The gallbladder appears normal. Pancreas: No inflammation or mass identified. Spleen: The spleen appears normal. Adrenals/Urinary Tract: The adrenal glands are normal. Unremarkable appearance of the right kidney. There is scarring involving the inferior pole of the right kidney. Urinary bladder is unremarkable. Stomach/Bowel: The stomach is normal. The small bowel loops have a normal course and caliber. The colon is negative. Vascular/Lymphatic: Calcified atherosclerotic disease involves the abdominal aorta. No aneurysm. No enlarged retroperitoneal or mesenteric adenopathy. No enlarged pelvic or inguinal lymph nodes. Reproductive: The uterus and adnexal structures are unremarkable for patient's age. Other: There is no ascites or focal fluid collections within the abdomen or pelvis. Musculoskeletal: There is degenerative disc disease noted within the lumbar spine. 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 IMPRESSION: 1. No new or progressive metastatic disease. 2.  Large anterior mediastinal right paratracheal lymph node is mildly decreased in size in the interval. There has been decrease in size of the sub- carinal lymph node. 3. Right perihilar soft tissue mass is stable to minimally increased in size in the interval. 4. Emphysema 5. Aortic atherosclerosis and coronary artery calcifications 6. Emphysema 7. Stable appearance of chronic T4 compression deformity and pagetoid disease involving the right femur. Electronically Signed   By: Kerby Moors M.D.   On: 06/11/2016 08:55   Dg Chest Portable 1 View  Result Date: 06/22/2016 CLINICAL DATA:  Acute shortness of breath today. History of left lung small cell carcinoma. EXAM: PORTABLE CHEST 1 VIEW COMPARISON:  06/18/2016 FINDINGS: Upper limits normal heart size noted. New left mid lung airspace disease is identified. Scarring/ posttreatment changes in the left perihilar region again noted. There is no evidence of pneumothorax or pleural effusion. No acute bony abnormalities are present. A right Port-A-Cath with tip overlying the mid -lower SVC again noted. IMPRESSION: New left mid lung airspace opacity, likely pneumonia or aspiration. No other significant change. Electronically Signed   By: Margarette Canada M.D.   On: 06/22/2016 16:06   Dg Chest Port 1 View  Result Date: 05/31/2016 CLINICAL DATA:  Lung cancer, dyspnea EXAM: PORTABLE CHEST 1 VIEW COMPARISON:  05/28/2016 FINDINGS: Cardiomediastinal silhouette is stable. Hyperinflation again noted. Again noted right paratracheal adenopathy. Stable left perihilar postradiation changes. No definite superimposed infiltrate or pulmonary edema. Mild perihilar bronchitic changes. Right IJ Port-A-Cath is unchanged in position. IMPRESSION: Hyperinflation again noted. Again noted right paratracheal adenopathy. Stable left perihilar postradiation changes. No definite superimposed infiltrate or pulmonary edema. Mild perihilar bronchitic changes. Electronically Signed   By: Lahoma Crocker M.D.    On: 05/31/2016 10:12     Hyland Mollenkopf M.D on 06/23/2016 at 12:07 PM  Between 7am to 7pm - Pager - [phone]  After 7pm go to [email or web address] - password [facility]  [facility] -  Office  [phone]

## 2016-06-24 LAB — URINE CULTURE: Culture: NO GROWTH

## 2016-06-24 LAB — BASIC METABOLIC PANEL
Anion gap: 6 (ref 5–15)
BUN: 13 mg/dL (ref 6–20)
CALCIUM: 7.8 mg/dL — AB (ref 8.9–10.3)
CO2: 31 mmol/L (ref 22–32)
CREATININE: 0.56 mg/dL (ref 0.44–1.00)
Chloride: 105 mmol/L (ref 101–111)
GFR calc Af Amer: >60 mL/min (ref >60)
GLUCOSE: 168 mg/dL — AB (ref 65–99)
Potassium: 3.4 mmol/L — ABNORMAL LOW (ref 3.5–5.1)
Sodium: 142 mmol/L (ref 135–145)

## 2016-06-24 LAB — PREPARE RBC (CROSSMATCH)

## 2016-06-24 LAB — CBC
HCT: 22 % — ABNORMAL LOW (ref 36.0–46.0)
Hemoglobin: 7.3 g/dL — ABNORMAL LOW (ref 12.0–15.0)
MCH: 26.2 pg (ref 26.0–34.0)
MCHC: 33.2 g/dL (ref 30.0–36.0)
MCV: 78.9 fL (ref 78.0–100.0)
PLATELETS: 205 10*3/uL (ref 150–400)
RBC: 2.79 MIL/uL — ABNORMAL LOW (ref 3.87–5.11)
RDW: 20.6 % — AB (ref 11.5–15.5)
WBC: 34.6 10*3/uL — AB (ref 4.0–10.5)

## 2016-06-24 LAB — ABO/RH: ABO/RH(D): O POS

## 2016-06-24 MED ORDER — GUAIFENESIN ER 600 MG PO TB12
600.0000 mg | ORAL_TABLET | Freq: Two times a day (BID) | ORAL | Status: DC
  Administered 2016-06-24 – 2016-06-27 (×7): 600 mg via ORAL
  Filled 2016-06-24 (×7): qty 1

## 2016-06-24 MED ORDER — VITAMIN D3 25 MCG (1000 UNIT) PO TABS
1000.0000 [IU] | ORAL_TABLET | Freq: Every day | ORAL | Status: DC
Start: 2016-06-24 — End: 2016-06-27
  Administered 2016-06-24 – 2016-06-27 (×4): 1000 [IU] via ORAL
  Filled 2016-06-24 (×4): qty 1

## 2016-06-24 MED ORDER — VITAMIN C 500 MG PO TABS
500.0000 mg | ORAL_TABLET | Freq: Every day | ORAL | Status: DC
Start: 2016-06-24 — End: 2016-06-27
  Administered 2016-06-24 – 2016-06-27 (×4): 500 mg via ORAL
  Filled 2016-06-24 (×4): qty 1

## 2016-06-24 MED ORDER — DILTIAZEM HCL ER 240 MG PO CP24
240.0000 mg | ORAL_CAPSULE | Freq: Every evening | ORAL | Status: DC
  Administered 2016-06-24 – 2016-06-26 (×3): 240 mg via ORAL
  Filled 2016-06-24 (×2): qty 1
  Filled 2016-06-24: qty 2
  Filled 2016-06-24 (×5): qty 1

## 2016-06-24 MED ORDER — FOLIC ACID 0.5 MG HALF TAB
500.0000 ug | ORAL_TABLET | Freq: Every day | ORAL | Status: DC
  Administered 2016-06-24 – 2016-06-27 (×4): 0.5 mg via ORAL
  Filled 2016-06-24 (×4): qty 1

## 2016-06-24 MED ORDER — FAMOTIDINE 20 MG PO TABS
20.0000 mg | ORAL_TABLET | Freq: Two times a day (BID) | ORAL | Status: DC
  Administered 2016-06-24 – 2016-06-27 (×7): 20 mg via ORAL
  Filled 2016-06-24 (×7): qty 1

## 2016-06-24 MED ORDER — PANTOPRAZOLE SODIUM 40 MG PO TBEC
80.0000 mg | DELAYED_RELEASE_TABLET | Freq: Every day | ORAL | Status: DC
  Administered 2016-06-24 – 2016-06-27 (×4): 80 mg via ORAL
  Filled 2016-06-24 (×4): qty 2

## 2016-06-24 MED ORDER — MAGIC MOUTHWASH W/LIDOCAINE
5.0000 mL | Freq: Four times a day (QID) | ORAL | Status: DC
  Administered 2016-06-24 – 2016-06-27 (×10): 5 mL via ORAL
  Filled 2016-06-24 (×17): qty 5

## 2016-06-24 MED ORDER — BOOST PLUS PO LIQD
237.0000 mL | Freq: Two times a day (BID) | ORAL | Status: DC
  Administered 2016-06-24 – 2016-06-27 (×4): 237 mL via ORAL
  Filled 2016-06-24 (×8): qty 237

## 2016-06-24 MED ORDER — SIMVASTATIN 20 MG PO TABS
10.0000 mg | ORAL_TABLET | Freq: Every day | ORAL | Status: DC
  Administered 2016-06-24 – 2016-06-27 (×4): 10 mg via ORAL
  Filled 2016-06-24 (×4): qty 1

## 2016-06-24 MED ORDER — BENZONATATE 100 MG PO CAPS: 100.0000 mg | ORAL_CAPSULE | Freq: Three times a day (TID) | ORAL | Status: DC | PRN

## 2016-06-24 MED ORDER — MAGNESIUM OXIDE 400 (241.3 MG) MG PO TABS
400.0000 mg | ORAL_TABLET | Freq: Two times a day (BID) | ORAL | Status: DC
  Administered 2016-06-24 – 2016-06-27 (×7): 400 mg via ORAL
  Filled 2016-06-24 (×7): qty 1

## 2016-06-24 MED ORDER — MORPHINE SULFATE ER 15 MG PO TBCR
15.0000 mg | EXTENDED_RELEASE_TABLET | Freq: Two times a day (BID) | ORAL | Status: DC
  Administered 2016-06-24 – 2016-06-27 (×7): 15 mg via ORAL
  Filled 2016-06-24 (×7): qty 1

## 2016-06-24 MED ORDER — SODIUM CHLORIDE 0.9 % IV SOLN
Freq: Once | INTRAVENOUS | Status: AC
  Administered 2016-06-24: 11:00:00 via INTRAVENOUS

## 2016-06-24 MED ORDER — BUDESONIDE 0.25 MG/2ML IN SUSP
0.2500 mg | Freq: Two times a day (BID) | RESPIRATORY_TRACT | Status: DC
  Administered 2016-06-24 – 2016-06-27 (×7): 0.25 mg via RESPIRATORY_TRACT
  Filled 2016-06-24 (×6): qty 2

## 2016-06-24 MED ORDER — POTASSIUM CHLORIDE CRYS ER 20 MEQ PO TBCR
40.0000 meq | EXTENDED_RELEASE_TABLET | Freq: Once | ORAL | Status: AC
  Administered 2016-06-24: 40 meq via ORAL
  Filled 2016-06-24: qty 2

## 2016-06-24 MED ORDER — CALCIUM CARBONATE-VITAMIN D 500-200 MG-UNIT PO TABS
1.0000 | ORAL_TABLET | Freq: Every day | ORAL | Status: DC
  Administered 2016-06-24 – 2016-06-27 (×4): 1 via ORAL
  Filled 2016-06-24 (×4): qty 1

## 2016-06-24 NOTE — Progress Notes (Signed)
PROGRESS NOTE                                                                                                                                                                                                             Patient Demographics:    Destiny Harrison, is a 75 y.o. female, DOB - 01-10-1941, JEH:[id number]  Admit date - 06/22/2016   Admitting Physician Albertine Patricia, MD  Outpatient Primary MD for the patient is Thressa Sheller, MD  LOS - 2   Chief Complaint  Patient presents with  . Shortness of Breath  . Lung Cancer       Brief Narrative   75 y.o. female,with medical history significant formetastatic non-small cell lung cancer, COPD with chronic hypoxic respiratory failure, chronic pain, and GERD , recently discharged from [facility] due to sepsis from UTI, admitted with shortness of breath and cough secondary to aspiration.   Subjective:    Raima Geathers today has, No headache,  No abdominal pain - No Nausea, reports cough, nonproductive  Assessment  & Plan :    Active Problems:   COPD, moderate ([place])   Primary cancer of right upper lobe of lung (HCC)   Dysphagia   Protein-calorie malnutrition, severe   Sepsis ([address])   Acute on chronic hypoxic respiratory failure - She is on baseline 3 L nasal cannula, secondary to COPD disease and lung cancer. - Requiring BiPAP initially, significantly improved, back to baseline - CTA chest negative for PE  Sepsis secondary to aspiration pneumonia - Meet sepsis criteria on admission giving tachypnea, fever, tachycardia, and leukocytosis, with elevated lactic acid   - lactic acid trending down, pro-calcitonin within normal limits, Remains with significant leukocytosis, this may be multifactorial due to sepsis, as well as she recently received Neulasta, as well due to steroids.  Aspiration pneumonia - She reports her symptoms started  after she was having breakfast eating egg at home,  will she felt choking on it, CT chest significant for left lower lobe opacity. - Initially on vancomycin and Zosyn, start vancomycin on 9/3 ,continue with Zosyn - Follow on blood cultures  Dysphagia - Seen by SLP, moderate risk for aspiration, continue with regular thin liquid diet  Chronic muscular skeletal chest pain - Patient with reproducible left-sided chest pain, chronic  Metastatic NSCLC  - Followed by Dr. Julien Nordmann  of oncology in the outpatient setting   COPD - Discharged recently on prednisone taper,  - Continue to wean IV steroids ,continue with nebs, continue with Pulmicort.  Normocytic anemia  - Attributed to chemotherapy and managed with pegfilgrastim, last given on 06/15/16  - Glycohemoglobin is 7.3 today, will transfuse 1 unit PRBC  GERD  - PPI when able to swallow  Chronic pain  - Attributed to OA and cancer,  - Continue home regimen of long-acting morphine with Norco prn breakthrough pain unable to take oral.  Protein calorie malnutrition - Continue with supplements       Code Status : DO NOT RESUSCITATE  Family Communication  : None at bedside  Disposition Plan  : Pending PT evaluation  Consults  :  none  Procedures  : none  DVT Prophylaxis  :  Heparin  Lab Results  Component Value Date   PLT 205 06/24/2016    Antibiotics  :    Anti-infectives    Start     Dose/Rate Route Frequency Ordered Stop   06/23/16 0600  vancomycin (VANCOCIN) 500 mg in sodium chloride 0.9 % 100 mL IVPB  Status:  Discontinued     500 mg 100 mL/hr over 60 Minutes Intravenous Every 12 hours 06/22/16 1744 06/23/16 1214   06/22/16 2200  piperacillin-tazobactam (ZOSYN) IVPB 3.375 g     3.375 g 12.5 mL/hr over 240 Minutes Intravenous Every 8 hours 06/22/16 1745     06/22/16 1600  ceFEPIme (MAXIPIME) 2 g in dextrose 5 % 50 mL IVPB     2 g 100 mL/hr over 30 Minutes Intravenous  Once 06/22/16 1556 06/22/16 1730   06/22/16 1600  vancomycin (VANCOCIN) IVPB 1000  mg/200 mL premix     1,000 mg 200 mL/hr over 60 Minutes Intravenous  Once 06/22/16 1556 06/22/16 1828        Objective:   Vitals:   06/24/16 0030 06/24/16 0415 06/24/16 0737 06/24/16 0800  BP:    (!) 166/83  Pulse:    (!) 130  Resp:    (!) 32  Temp: 97.4 F (36.3 C) 98.3 F (36.8 C)  97.4 F (36.3 C)  TempSrc: Oral Oral  Oral  SpO2:   100% 99%  Weight:      Height:        Wt Readings from Last 3 Encounters:  06/22/16 50.4 kg (111 lb 1.8 oz)  06/21/16 51.4 kg (113 lb 4.8 oz)  06/13/16 50.4 kg (111 lb 1.6 oz)     Intake/Output Summary (Last 24 hours) at 06/24/16 1107 Last data filed at 06/24/16 0524  Gross per 24 hour  Intake             1220 ml  Output              600 ml  Net              620 ml     Physical Exam  Awake Alert, Oriented X 3, Frail Supple Neck,No JVD Symmetrical Chest wall movement, Good air movement bilaterally, no wheezing Tachycardic,No Gallops,Rubs or new Murmurs, reducible midsternal chest pain on palpation +ve B.Sounds, Abd Soft, No tenderness,  No rebound - guarding or rigidity. No Cyanosis, Clubbing or edema, No new Rash or bruise     Data Review:    CBC  Recent Labs Lab 06/18/16 2002 06/19/16 0625 06/20/16 0510 06/22/16 1539 06/23/16 0430 06/24/16 0400  WBC 4.1 1.5* 6.3 40.8* 28.7* 34.6*  HGB 9.3* 8.9* 8.4*  8.7* 8.1* 7.3*  HCT 28.2* 27.4* 25.6* 26.7* 24.7* 22.0*  PLT 202 166 163 192 162 205  MCV 80.6 79.2 79.8 79.5 79.4 78.9  MCH 26.6 25.7* 26.2 25.9* 26.0 26.2  MCHC 33.0 32.5 32.8 32.6 32.8 33.2  RDW 20.1* 19.6* 19.8* 20.0* 20.3* 20.6*  LYMPHSABS 0.5* 0.2* 0.3* 0.8  --   --   MONOABS 0.3 0.2 1.0 2.0*  --   --   EOSABS 0.0 0.0 0.0 0.0  --   --   BASOSABS 0.0 0.0 0.0 0.0  --   --     Chemistries   Recent Labs Lab 06/18/16 2002 06/19/16 0012 06/19/16 0625 06/20/16 0510 06/22/16 1539 06/23/16 0430 06/24/16 0400  NA 134*  --  134* 139 140 139 142  K 2.8*  --  3.3* 4.8 4.3 3.9 3.4*  CL 93*  --  96* 104 100*  102 105  CO2 32  --  30 31 32 30 31  GLUCOSE 86  --  103* 152* 159* 174* 168*  BUN 20  --  '15 17 16 11 13  '$ CREATININE 0.68  --  0.62 0.57 0.63 0.52 0.56  CALCIUM 8.1*  --  7.7* 8.2* 9.4 8.3* 7.8*  MG  --  1.0*  --  2.4  --   --   --   AST 13*  --   --   --  17  --   --   ALT 13*  --   --   --  18  --   --   ALKPHOS 78  --   --   --  146*  --   --   BILITOT 1.4*  --   --   --  0.8  --   --    ------------------------------------------------------------------------------------------------------------------ No results for input(s): CHOL, HDL, LDLCALC, TRIG, CHOLHDL, LDLDIRECT in the last 72 hours.  Lab Results  Component Value Date   HGBA1C 6.6 (H) 02/13/2016   ------------------------------------------------------------------------------------------------------------------ No results for input(s): TSH, T4TOTAL, T3FREE, THYROIDAB in the last 72 hours.  Invalid input(s): FREET3 ------------------------------------------------------------------------------------------------------------------ No results for input(s): VITAMINB12, FOLATE, FERRITIN, TIBC, IRON, RETICCTPCT in the last 72 hours.  Coagulation profile  Recent Labs Lab 06/19/16 0012 06/22/16 1800  INR 1.06 1.07     Recent Labs  06/22/16 1539  DDIMER 0.87*    Cardiac Enzymes No results for input(s): CKMB, TROPONINI, MYOGLOBIN in the last 168 hours.  Invalid input(s): CK ------------------------------------------------------------------------------------------------------------------    Component Value Date/Time   BNP 388.1 (H) 06/22/2016 1539    Inpatient Medications  Scheduled Meds: . sodium chloride   Intravenous Once  . budesonide  0.25 mg Nebulization BID  . calcium-vitamin D  1 tablet Oral Daily  . cholecalciferol  1,000 Units Oral Daily  . diltiazem  240 mg Oral QPM  . famotidine  20 mg Oral BID  . folic acid  749 mcg Oral Daily  . guaiFENesin  600 mg Oral BID  . heparin  5,000 Units Subcutaneous  Q8H  . ipratropium-albuterol  3 mL Nebulization TID  . lactose free nutrition  237 mL Oral BID BM  . magic mouthwash w/lidocaine  5 mL Oral QID  . magnesium oxide  400 mg Oral BID  . methylPREDNISolone (SOLU-MEDROL) injection  40 mg Intravenous Q8H  . morphine  15 mg Oral Q12H  . pantoprazole  80 mg Oral Daily  . piperacillin-tazobactam (ZOSYN)  IV  3.375 g Intravenous Q8H  . simvastatin  10 mg Oral Daily  .  vitamin C  500 mg Oral Daily   Continuous Infusions: . sodium chloride 50 mL/hr at 06/23/16 2120   PRN Meds:.acetaminophen, albuterol, benzonatate, morphine injection  Micro Results Recent Results (from the past 240 hour(s))  Urine culture     Status: Abnormal   Collection Time: 06/18/16  7:20 PM  Result Value Ref Range Status   Specimen Description URINE, CLEAN CATCH  Final   Special Requests NONE  Final   Culture MULTIPLE SPECIES PRESENT, SUGGEST RECOLLECTION (A)  Final   Report Status 06/20/2016 FINAL  Final  Culture, blood (Routine X 2)     Status: None   Collection Time: 06/18/16  8:07 PM  Result Value Ref Range Status   Specimen Description BLOOD PORTA CATH RIGHT  Final   Special Requests BOTTLES DRAWN AEROBIC AND ANAEROBIC 5 CC EACH  Final   Culture   Final    NO GROWTH 5 DAYS Performed at [facility]    Report Status 06/23/2016 FINAL  Final  Culture, blood (Routine X 2)     Status: None   Collection Time: 06/18/16  8:11 PM  Result Value Ref Range Status   Specimen Description BLOOD LEFT ANTECUBITAL  Final   Special Requests BOTTLES DRAWN AEROBIC AND ANAEROBIC 5 CC EACH  Final   Culture   Final    NO GROWTH 5 DAYS Performed at [facility]    Report Status 06/23/2016 FINAL  Final  MRSA PCR Screening     Status: None   Collection Time: 06/19/16 12:57 AM  Result Value Ref Range Status   MRSA by PCR NEGATIVE NEGATIVE Final    Comment:        The GeneXpert MRSA Assay (FDA approved for NASAL specimens only), is one component of  a comprehensive MRSA colonization surveillance program. It is not intended to diagnose MRSA infection nor to guide or monitor treatment for MRSA infections.   Blood culture (routine x 2)     Status: None (Preliminary result)   Collection Time: 06/22/16  3:39 PM  Result Value Ref Range Status   Specimen Description BLOOD PORTA CATH  Final   Special Requests BOTTLES DRAWN AEROBIC AND ANAEROBIC 5CC  Final   Culture   Final    NO GROWTH < 24 HOURS Performed at [facility]    Report Status PENDING  Incomplete  Blood culture (routine x 2)     Status: None (Preliminary result)   Collection Time: 06/22/16  4:30 PM  Result Value Ref Range Status   Specimen Description BLOOD BLOOD LEFT FOREARM  Final   Special Requests IN PEDIATRIC BOTTLE 3CC  Final   Culture   Final    NO GROWTH < 24 HOURS Performed at [facility]    Report Status PENDING  Incomplete  MRSA PCR Screening     Status: None   Collection Time: 06/22/16  9:29 PM  Result Value Ref Range Status   MRSA by PCR NEGATIVE NEGATIVE Final    Comment:        The GeneXpert MRSA Assay (FDA approved for NASAL specimens only), is one component of a comprehensive MRSA colonization surveillance program. It is not intended to diagnose MRSA infection nor to guide or monitor treatment for MRSA infections.   Urine culture     Status: None   Collection Time: 06/22/16 11:00 PM  Result Value Ref Range Status   Specimen Description URINE, RANDOM  Final   Special Requests NONE  Final  Culture NO GROWTH Performed at [facility]   Final   Report Status 06/24/2016 FINAL  Final    Radiology Reports Dg Chest 2 View  Result Date: 06/18/2016 CLINICAL DATA:  Shortness of breath, LEFT chest pain and fever this morning. History of COPD, lung cancer. EXAM: CHEST  2 VIEW COMPARISON:  COPD and RIGHT hilar lymphadenopathy. No acute cardiopulmonary process. FINDINGS: Cardiac silhouette is normal. Fullness of the RIGHT hila  corresponding to known mediastinal lymph nodes. No pleural effusion or focal consolidation. Increased lung volumes consistent with history of COPD. Single lumen RIGHT chest Port-A-Cath with distal tip projecting in proximal superior vena cava. High-riding RIGHT humeral head most compatible with old rotator cuff injury. Broad levoscoliosis. IMPRESSION: COPD and RIGHT hilar mass/lymphadenopathy without acute cardiopulmonary process. Electronically Signed   By: Elon Alas M.D.   On: 06/18/2016 20:41   Dg Chest 2 View  Result Date: 05/28/2016 CLINICAL DATA:  SOB increasing x 2 days - currently being treated for lung cancer - hx COPD - hx asthma EXAM: CHEST  2 VIEW COMPARISON:  05/07/2016 FINDINGS: The cardiac silhouette is normal in size. No mediastinal masses or convincing adenopathy. Coarse reticular type opacity extends from superior left hilum. There is fullness from the inferior right hilum. These findings stable. Lungs are hyperexpanded but otherwise clear. No pleural effusion or pneumothorax. Right anterior chest wall Port-A-Cath is stable. Bony thorax is demineralized. Mild compression fracture of a mid to upper thoracic vertebra, also stable. No convincing osteoblastic or osteolytic lesions. IMPRESSION: No acute cardiopulmonary disease. No significant change from the previous chest radiograph. Electronically Signed   By: Lajean Manes M.D.   On: 05/28/2016 14:49   Ct Chest W Contrast  Result Date: 06/11/2016 CLINICAL DATA:  Followup lung cancer. EXAM: CT CHEST, ABDOMEN, AND PELVIS WITH CONTRAST TECHNIQUE: Multidetector CT imaging of the chest, abdomen and pelvis was performed following the standard protocol during bolus administration of intravenous contrast. CONTRAST:  173m ISOVUE-300 IOPAMIDOL (ISOVUE-300) INJECTION 61% COMPARISON:  04/16/2016 FINDINGS: CT CHEST FINDINGS Cardiovascular: The heart size is normal. No pericardial effusion. There is aortic atherosclerosis. Calcification within the  LAD, and left circumflex coronary artery noted. Mediastinum/Nodes: The trachea appears patent and is midline. Unremarkable appearance of the esophagus. Partially calcified anterior mediastinal, right paratracheal mass measures 3.4 x 3.4 cm, image 12 of series 5. This is compared with 3.9 x 3.7 cm previously. Sub- carinal lymph node measures 7 mm, image 24 of series 5. On the previous exam this measured 10 mm. Lungs/Pleura: No pleural effusion. Advanced changes of centrilobular and paraseptal emphysema identified. Paramediastinal radiation change within the left lung is unchanged from previous exam. The perihilar right lung lesion measures 1.8 x 1.5 cm, image 30 of series 5. On the previous exam this measured 1.7 x 1.5 cm. Tiny nodule in the right lower lobe is unchanged measuring 2 mm, image 84 of series 4. Musculoskeletal: No aggressive lytic or sclerotic bone lesions identified. T4 compression deformity is stable from previous exam. CT ABDOMEN PELVIS FINDINGS Hepatobiliary: Stable hypervascular structure within the medial segment of left lobe of liver measuring 1 cm, image 59 of series 5. Stable 5 mm adjacent hypervascular lesion, image number 62 of series 5. No intrahepatic bile duct dilatation. The gallbladder appears normal. Pancreas: No inflammation or mass identified. Spleen: The spleen appears normal. Adrenals/Urinary Tract: The adrenal glands are normal. Unremarkable appearance of the right kidney. There is scarring involving the inferior pole of the right kidney. Urinary bladder is unremarkable.  Stomach/Bowel: The stomach is normal. The small bowel loops have a normal course and caliber. The colon is negative. Vascular/Lymphatic: Calcified atherosclerotic disease involves the abdominal aorta. No aneurysm. No enlarged retroperitoneal or mesenteric adenopathy. No enlarged pelvic or inguinal lymph nodes. Reproductive: The uterus and adnexal structures are unremarkable for patient's age. Other: There is no  ascites or focal fluid collections within the abdomen or pelvis. Musculoskeletal: There is degenerative disc disease noted within the lumbar spine. 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 IMPRESSION: 1. No new or progressive metastatic disease. 2. Large anterior mediastinal right paratracheal lymph node is mildly decreased in size in the interval. There has been decrease in size of the sub- carinal lymph node. 3. Right perihilar soft tissue mass is stable to minimally increased in size in the interval. 4. Emphysema 5. Aortic atherosclerosis and coronary artery calcifications 6. Emphysema 7. Stable appearance of chronic T4 compression deformity and pagetoid disease involving the right femur. Electronically Signed   By: Kerby Moors M.D.   On: 06/11/2016 08:55   Ct Angio Chest Pe W/cm &/or Wo Cm  Result Date: 06/22/2016 CLINICAL DATA:  Worsening shortness of breath. Right lung carcinoma. COPD. EXAM: CT ANGIOGRAPHY CHEST WITH CONTRAST TECHNIQUE: Multidetector CT imaging of the chest was performed using the standard protocol during bolus administration of intravenous contrast. Multiplanar CT image reconstructions and MIPs were obtained to evaluate the vascular anatomy. CONTRAST:  100 mL Isovue 370 COMPARISON:  06/11/2016 FINDINGS: Cardiovascular: Satisfactory opacification of pulmonary arteries noted, and no pulmonary emboli identified. No evidence of thoracic aortic dissection or aneurysm. Normal heart size. Aortic atherosclerosis. Mediastinum/Lymph Nodes: Right paratracheal lymphadenopathy measures 3.4 x 3.4 cm on image 17/4 8 shows no significant change since previous study. Subcarinal lymph node measures 14 mm on image 35/4 compared with 7 mm previously. Lungs/Pleura: Mass in the central right perihilar region currently measures 2.0 x 1.8 cm on image 45/4 compared to 1.5 x 1.8 cm previously. Right middle lobe atelectasis shows no significant change. Moderate emphysema is again demonstrated. Left  lung paramediastinal radiation changes are again seen. New airspace disease is seen throughout the majority of the left lower lobe, suspicious for pneumonia. No evidence of pleural effusion. Upper abdomen: No acute findings. Musculoskeletal: No chest wall mass or suspicious bone lesions identified. Review of the MIP images confirms the above findings. IMPRESSION: No evidence of pulmonary embolism. New left lower lobe airspace disease, suspicious for pneumonia. Slight increase in size of central right perihilar lung mass, with stable peripheral right middle lobe atelectasis. Mild increase in subcarinal mediastinal lymphadenopathy. Stable right paratracheal mediastinal lymphadenopathy. Emphysema. Electronically Signed   By: Earle Gell M.D.   On: 06/22/2016 19:26   Ct Angio Chest Pe W Or Wo Contrast  Result Date: 06/01/2016 CLINICAL DATA:  Hemoptysis EXAM: CT ANGIOGRAPHY CHEST WITH CONTRAST TECHNIQUE: Multidetector CT imaging of the chest was performed using the standard protocol during bolus administration of intravenous contrast. Multiplanar CT image reconstructions and MIPs were obtained to evaluate the vascular anatomy. CONTRAST:  100 cc Isovue 370 COMPARISON:  02/12/2016 FINDINGS: There are no filling defects in the pulmonary arterial tree to suggest acute pulmonary thromboembolism. Right peritracheal mass in the upper mediastinum measures 3.2 x 3.1 cm and previously measured 6.4 x 4.5 cm. Is improved. Ill-defined calcifications persist within the mass. Post radiation changes in the medial left lung are stable. Soft tissue prominence in the medial right middle lobe have improved. A focal ground-glass opacity in the lateral basal right lower  lobe on image 63 measures 10 mm. Small pleural effusions have developed. Bilateral dependent atelectasis. Partial solid nodule in the left lower lobe measures 5 mm on image 49. There is fluid or mucus material in left lower lobe airways extending to the posterior basal  segment. See image 49. Severe emphysema persists. Interlobular septa are more prominent suggesting interstitial edema or pneumonia. Biapical scarring is stable. No pneumothorax. Right jugular Port-A-Cath is stable. Right ventricle is dilated. Diffuse hepatic steatosis. Calcified granuloma in the right lobe of the liver. Tiny hypodensities in the lateral spleen are nonspecific. Mid-level thoracic compression deformities are stable. This includes T4, T5, and probably T6. Review of the MIP images confirms the above findings. IMPRESSION: No evidence of acute pulmonary thromboembolism New small bilateral pleural effusions with dependent atelectasis New sub cm part solid left lower lobe nodule. New 10 mm ground-glass opacity in the right lower lobe. Central right middle lobe mass and large right paratracheal mass are improved. New interstitial changes suggesting interstitial edema or pneumonia. Electronically Signed   By: Marybelle Killings M.D.   On: 06/01/2016 09:49   Ct Abdomen Pelvis W Contrast  Result Date: 06/11/2016 CLINICAL DATA:  Followup lung cancer. EXAM: CT CHEST, ABDOMEN, AND PELVIS WITH CONTRAST TECHNIQUE: Multidetector CT imaging of the chest, abdomen and pelvis was performed following the standard protocol during bolus administration of intravenous contrast. CONTRAST:  124m ISOVUE-300 IOPAMIDOL (ISOVUE-300) INJECTION 61% COMPARISON:  04/16/2016 FINDINGS: CT CHEST FINDINGS Cardiovascular: The heart size is normal. No pericardial effusion. There is aortic atherosclerosis. Calcification within the LAD, and left circumflex coronary artery noted. Mediastinum/Nodes: The trachea appears patent and is midline. Unremarkable appearance of the esophagus. Partially calcified anterior mediastinal, right paratracheal mass measures 3.4 x 3.4 cm, image 12 of series 5. This is compared with 3.9 x 3.7 cm previously. Sub- carinal lymph node measures 7 mm, image 24 of series 5. On the previous exam this measured 10 mm.  Lungs/Pleura: No pleural effusion. Advanced changes of centrilobular and paraseptal emphysema identified. Paramediastinal radiation change within the left lung is unchanged from previous exam. The perihilar right lung lesion measures 1.8 x 1.5 cm, image 30 of series 5. On the previous exam this measured 1.7 x 1.5 cm. Tiny nodule in the right lower lobe is unchanged measuring 2 mm, image 84 of series 4. Musculoskeletal: No aggressive lytic or sclerotic bone lesions identified. T4 compression deformity is stable from previous exam. CT ABDOMEN PELVIS FINDINGS Hepatobiliary: Stable hypervascular structure within the medial segment of left lobe of liver measuring 1 cm, image 59 of series 5. Stable 5 mm adjacent hypervascular lesion, image number 62 of series 5. No intrahepatic bile duct dilatation. The gallbladder appears normal. Pancreas: No inflammation or mass identified. Spleen: The spleen appears normal. Adrenals/Urinary Tract: The adrenal glands are normal. Unremarkable appearance of the right kidney. There is scarring involving the inferior pole of the right kidney. Urinary bladder is unremarkable. Stomach/Bowel: The stomach is normal. The small bowel loops have a normal course and caliber. The colon is negative. Vascular/Lymphatic: Calcified atherosclerotic disease involves the abdominal aorta. No aneurysm. No enlarged retroperitoneal or mesenteric adenopathy. No enlarged pelvic or inguinal lymph nodes. Reproductive: The uterus and adnexal structures are unremarkable for patient's age. Other: There is no ascites or focal fluid collections within the abdomen or pelvis. Musculoskeletal: There is degenerative disc disease noted within the lumbar spine. 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 IMPRESSION: 1. No new or progressive metastatic disease. 2. Large anterior mediastinal right  paratracheal lymph node is mildly decreased in size in the interval. There has been decrease in size of the sub- carinal  lymph node. 3. Right perihilar soft tissue mass is stable to minimally increased in size in the interval. 4. Emphysema 5. Aortic atherosclerosis and coronary artery calcifications 6. Emphysema 7. Stable appearance of chronic T4 compression deformity and pagetoid disease involving the right femur. Electronically Signed   By: Kerby Moors M.D.   On: 06/11/2016 08:55   Dg Chest Portable 1 View  Result Date: 06/22/2016 CLINICAL DATA:  Acute shortness of breath today. History of left lung small cell carcinoma. EXAM: PORTABLE CHEST 1 VIEW COMPARISON:  06/18/2016 FINDINGS: Upper limits normal heart size noted. New left mid lung airspace disease is identified. Scarring/ posttreatment changes in the left perihilar region again noted. There is no evidence of pneumothorax or pleural effusion. No acute bony abnormalities are present. A right Port-A-Cath with tip overlying the mid -lower SVC again noted. IMPRESSION: New left mid lung airspace opacity, likely pneumonia or aspiration. No other significant change. Electronically Signed   By: Margarette Canada M.D.   On: 06/22/2016 16:06   Dg Chest Port 1 View  Result Date: 05/31/2016 CLINICAL DATA:  Lung cancer, dyspnea EXAM: PORTABLE CHEST 1 VIEW COMPARISON:  05/28/2016 FINDINGS: Cardiomediastinal silhouette is stable. Hyperinflation again noted. Again noted right paratracheal adenopathy. Stable left perihilar postradiation changes. No definite superimposed infiltrate or pulmonary edema. Mild perihilar bronchitic changes. Right IJ Port-A-Cath is unchanged in position. IMPRESSION: Hyperinflation again noted. Again noted right paratracheal adenopathy. Stable left perihilar postradiation changes. No definite superimposed infiltrate or pulmonary edema. Mild perihilar bronchitic changes. Electronically Signed   By: Lahoma Crocker M.D.   On: 05/31/2016 10:12     [facility], Kymora Sciara M.D on 06/24/2016 at 11:07 AM  Between 7am to 7pm - Pager - [phone]  After 7pm go to [email or web address]  - password [facility]  [facility] -  Office  [phone]

## 2016-06-25 LAB — BASIC METABOLIC PANEL
ANION GAP: 5 (ref 5–15)
BUN: 12 mg/dL (ref 6–20)
CHLORIDE: 106 mmol/L (ref 101–111)
CO2: 29 mmol/L (ref 22–32)
CREATININE: 0.48 mg/dL (ref 0.44–1.00)
Calcium: 7.9 mg/dL — ABNORMAL LOW (ref 8.9–10.3)
GFR calc non Af Amer: >60 mL/min (ref >60)
Glucose, Bld: 154 mg/dL — ABNORMAL HIGH (ref 65–99)
Potassium: 3.6 mmol/L (ref 3.5–5.1)
SODIUM: 140 mmol/L (ref 135–145)

## 2016-06-25 LAB — CBC WITH DIFFERENTIAL/PLATELET
BASOS ABS: 0 10*3/uL (ref 0.0–0.1)
Basophils Relative: 0 %
EOS PCT: 0 %
Eosinophils Absolute: 0 10*3/uL (ref 0.0–0.7)
HEMATOCRIT: 26.8 % — AB (ref 36.0–46.0)
HEMOGLOBIN: 9 g/dL — AB (ref 12.0–15.0)
LYMPHS PCT: 2 %
Lymphs Abs: 0.7 10*3/uL (ref 0.7–4.0)
MCH: 27 pg (ref 26.0–34.0)
MCHC: 33.6 g/dL (ref 30.0–36.0)
MCV: 80.5 fL (ref 78.0–100.0)
MONOS PCT: 2 %
Monocytes Absolute: 0.7 10*3/uL (ref 0.1–1.0)
NEUTROS PCT: 96 %
Neutro Abs: 32 10*3/uL — ABNORMAL HIGH (ref 1.7–7.7)
Platelets: 221 10*3/uL (ref 150–400)
RBC: 3.33 MIL/uL — AB (ref 3.87–5.11)
RDW: 19.4 % — ABNORMAL HIGH (ref 11.5–15.5)
WBC: 33.4 10*3/uL — AB (ref 4.0–10.5)

## 2016-06-25 LAB — TYPE AND SCREEN
ABO/RH(D): O POS
Antibody Screen: NEGATIVE
Unit division: 0

## 2016-06-25 LAB — BLOOD GAS, ARTERIAL
ACID-BASE EXCESS: 4.5 mmol/L — AB (ref 0.0–2.0)
Bicarbonate: 28.2 mmol/L — ABNORMAL HIGH (ref 20.0–28.0)
Drawn by: 276051
Expiratory PAP: 8
FIO2: 50
INSPIRATORY PAP: 15
LHR: 12 {breaths}/min
Mode: POSITIVE
O2 SAT: 98.6 %
PH ART: 7.458 — AB (ref 7.350–7.450)
PO2 ART: 181 mmHg — AB (ref 83.0–108.0)
PRESSURE CONTROL: 15 cmH2O
Patient temperature: 98.6
pCO2 arterial: 40.4 mmHg (ref 32.0–48.0)

## 2016-06-25 MED ORDER — PREDNISONE 5 MG PO TABS
30.0000 mg | ORAL_TABLET | Freq: Every day | ORAL | Status: DC
  Administered 2016-06-26 – 2016-06-27 (×2): 30 mg via ORAL
  Filled 2016-06-25 (×2): qty 1

## 2016-06-25 MED ORDER — HYDROCODONE-ACETAMINOPHEN 5-325 MG PO TABS
1.0000 | ORAL_TABLET | ORAL | Status: DC | PRN
  Administered 2016-06-25 – 2016-06-27 (×3): 1 via ORAL
  Filled 2016-06-25 (×3): qty 1

## 2016-06-25 NOTE — Progress Notes (Addendum)
PROGRESS NOTE                                                                                                                                                                                                             Patient Demographics:    Destiny Harrison, is a 75 y.o. female, DOB - December 12, 1940, HMC:[id number]  Admit date - 06/22/2016   Admitting Physician Albertine Patricia, MD  Outpatient Primary MD for the patient is Thressa Sheller, MD   LOS - 3   Chief Complaint  Patient presents with  . Shortness of Breath  . Lung Cancer       Brief Narrative   75 y.o. female,with medical history significant formetastatic non-small cell lung cancer, COPD with chronic hypoxic respiratory failure, chronic pain, and GERD , recently discharged from [facility] long hospital due to sepsis from UTI, admitted with shortness of breath and cough secondary to aspiration, Initially requiring BiPAP, but currently back to baseline 3 L nasal cannula.   Subjective:    Evanthia Maund today has, No headache,  No abdominal pain - No Nausea, reports cough, nonproductive  Assessment  & Plan :    Active Problems:   COPD, moderate ([place])   Primary cancer of right upper lobe of lung (HCC)   Dysphagia   Protein-calorie malnutrition, severe   Sepsis ([place])   Acute on chronic hypoxic respiratory failure - She is on baseline 3 L nasal cannula, secondary to COPD disease and lung cancer. - Requiring BiPAP initially, significantly improved, back to baseline - CTA chest negative for PE  Sepsis secondary to aspiration pneumonia - Meet sepsis criteria on admission giving tachypnea, fever, tachycardia, and leukocytosis, with elevated lactic acid   - lactic acid trending down, pro-calcitonin within normal limits,  - Remains with significant leukocytosis, this may be multifactorial due to sepsis, as well as she recently received Neulasta, as well due to steroids. - Patient remains tachycardic  despite resuming back and heart home dose Cardizem, will increase IV fluids  Leukocytosis - This is secondary to Neulasta, and typical for her,  (WBC was  58 K during last Neulasta dose)  Aspiration pneumonia - She reports her symptoms started  after she was having breakfast eating egg at home, will she felt choking on it, CT chest significant for left lower lobe opacity. - Initially on vancomycin  and Zosyn, stopped vancomycin on 9/3 ,continue with Zosyn, hopefully can be transitioned to Augmentin in 1-2 days - Follow on blood cultures, so far no growth to date  Dysphagia - Seen by SLP, moderate risk for aspiration, continue with regular thin liquid diet  Chronic muscular skeletal chest pain - Patient with reproducible left-sided chest pain, chronic  Metastatic NSCLC  - Followed by Dr. Julien Nordmann of oncology in the outpatient setting   COPD - Discharged recently on prednisone taper,  - Initially on IV steroids, wheezing significantly improved, will transition to oral prednisone from tomorrow ,continue with nebs, continue with Pulmicort.  Normocytic anemia  - Attributed to chemotherapy and managed with pegfilgrastim, last given on 06/15/16  - Glycohemoglobin is 7.3 today, will transfuse 1 unit PRBC  GERD  - PPI when able to swallow  Chronic pain  - Attributed to OA and cancer,  - Continue home regimen of long-acting morphine with Norco prn breakthrough pain   Protein calorie malnutrition - Continue with supplements       Code Status : DO NOT RESUSCITATE  Family Communication  : None at bedside  Disposition Plan  : Home in 1-2 days  Consults  :  none  Procedures  : none  DVT Prophylaxis  :  Heparin  Lab Results  Component Value Date   PLT 221 06/25/2016    Antibiotics  :    Anti-infectives    Start     Dose/Rate Route Frequency Ordered Stop   06/23/16 0600  vancomycin (VANCOCIN) 500 mg in sodium chloride 0.9 % 100 mL IVPB  Status:  Discontinued      500 mg 100 mL/hr over 60 Minutes Intravenous Every 12 hours 06/22/16 1744 06/23/16 1214   06/22/16 2200  piperacillin-tazobactam (ZOSYN) IVPB 3.375 g     3.375 g 12.5 mL/hr over 240 Minutes Intravenous Every 8 hours 06/22/16 1745     06/22/16 1600  ceFEPIme (MAXIPIME) 2 g in dextrose 5 % 50 mL IVPB     2 g 100 mL/hr over 30 Minutes Intravenous  Once 06/22/16 1556 06/22/16 1730   06/22/16 1600  vancomycin (VANCOCIN) IVPB 1000 mg/200 mL premix     1,000 mg 200 mL/hr over 60 Minutes Intravenous  Once 06/22/16 1556 06/22/16 1828        Objective:   Vitals:   06/25/16 0000 06/25/16 0400 06/25/16 0730 06/25/16 0800  BP: (!) 164/92   (!) 154/65  Pulse: (!) 133   88  Resp: 18   18  Temp: 97.9 F (36.6 C) 97.6 F (36.4 C) 97.7 F (36.5 C)   TempSrc: Oral Oral Oral   SpO2: 98%  97% 99%  Weight:      Height:        Wt Readings from Last 3 Encounters:  06/22/16 50.4 kg (111 lb 1.8 oz)  06/21/16 51.4 kg (113 lb 4.8 oz)  06/13/16 50.4 kg (111 lb 1.6 oz)     Intake/Output Summary (Last 24 hours) at 06/25/16 1045 Last data filed at 06/25/16 1000  Gross per 24 hour  Intake           1912.5 ml  Output              600 ml  Net           1312.5 ml     Physical Exam  Awake Alert, Oriented X 3, Frail Supple Neck,No JVD Symmetrical Chest wall movement, Good air movement bilaterally, no wheezing Tachycardic,No Gallops,Rubs or  new Murmurs, reducible midsternal chest pain on palpation +ve B.Sounds, Abd Soft, No tenderness,  No rebound - guarding or rigidity. No Cyanosis, Clubbing or edema, No new Rash or bruise     Data Review:    CBC  Recent Labs Lab 06/18/16 2002 06/19/16 0625 06/20/16 0510 06/22/16 1539 06/23/16 0430 06/24/16 0400 06/25/16 0500  WBC 4.1 1.5* 6.3 40.8* 28.7* 34.6* 33.4*  HGB 9.3* 8.9* 8.4* 8.7* 8.1* 7.3* 9.0*  HCT 28.2* 27.4* 25.6* 26.7* 24.7* 22.0* 26.8*  PLT 202 166 163 192 162 205 221  MCV 80.6 79.2 79.8 79.5 79.4 78.9 80.5  MCH 26.6 25.7*  26.2 25.9* 26.0 26.2 27.0  MCHC 33.0 32.5 32.8 32.6 32.8 33.2 33.6  RDW 20.1* 19.6* 19.8* 20.0* 20.3* 20.6* 19.4*  LYMPHSABS 0.5* 0.2* 0.3* 0.8  --   --  0.7  MONOABS 0.3 0.2 1.0 2.0*  --   --  0.7  EOSABS 0.0 0.0 0.0 0.0  --   --  0.0  BASOSABS 0.0 0.0 0.0 0.0  --   --  0.0    Chemistries   Recent Labs Lab 06/18/16 2002 06/19/16 0012  06/20/16 0510 06/22/16 1539 06/23/16 0430 06/24/16 0400 06/25/16 0500  NA 134*  --   < > 139 140 139 142 140  K 2.8*  --   < > 4.8 4.3 3.9 3.4* 3.6  CL 93*  --   < > 104 100* 102 105 106  CO2 32  --   < > 31 32 '30 31 29  '$ GLUCOSE 86  --   < > 152* 159* 174* 168* 154*  BUN 20  --   < > '17 16 11 13 12  '$ CREATININE 0.68  --   < > 0.57 0.63 0.52 0.56 0.48  CALCIUM 8.1*  --   < > 8.2* 9.4 8.3* 7.8* 7.9*  MG  --  1.0*  --  2.4  --   --   --   --   AST 13*  --   --   --  17  --   --   --   ALT 13*  --   --   --  18  --   --   --   ALKPHOS 78  --   --   --  146*  --   --   --   BILITOT 1.4*  --   --   --  0.8  --   --   --   < > = values in this interval not displayed. ------------------------------------------------------------------------------------------------------------------ No results for input(s): CHOL, HDL, LDLCALC, TRIG, CHOLHDL, LDLDIRECT in the last 72 hours.  Lab Results  Component Value Date   HGBA1C 6.6 (H) 02/13/2016   ------------------------------------------------------------------------------------------------------------------ No results for input(s): TSH, T4TOTAL, T3FREE, THYROIDAB in the last 72 hours.  Invalid input(s): FREET3 ------------------------------------------------------------------------------------------------------------------ No results for input(s): VITAMINB12, FOLATE, FERRITIN, TIBC, IRON, RETICCTPCT in the last 72 hours.  Coagulation profile  Recent Labs Lab 06/19/16 0012 06/22/16 1800  INR 1.06 1.07     Recent Labs  06/22/16 1539  DDIMER 0.87*    Cardiac Enzymes No results for input(s):  CKMB, TROPONINI, MYOGLOBIN in the last 168 hours.  Invalid input(s): CK ------------------------------------------------------------------------------------------------------------------    Component Value Date/Time   BNP 388.1 (H) 06/22/2016 1539    Inpatient Medications  Scheduled Meds: . budesonide  0.25 mg Nebulization BID  . calcium-vitamin D  1 tablet Oral Daily  . cholecalciferol  1,000 Units Oral Daily  .  diltiazem  240 mg Oral QPM  . famotidine  20 mg Oral BID  . folic acid  850 mcg Oral Daily  . guaiFENesin  600 mg Oral BID  . heparin  5,000 Units Subcutaneous Q8H  . ipratropium-albuterol  3 mL Nebulization TID  . lactose free nutrition  237 mL Oral BID BM  . magic mouthwash w/lidocaine  5 mL Oral QID  . magnesium oxide  400 mg Oral BID  . morphine  15 mg Oral Q12H  . pantoprazole  80 mg Oral Daily  . piperacillin-tazobactam (ZOSYN)  IV  3.375 g Intravenous Q8H  . [START ON 06/26/2016] predniSONE  30 mg Oral Q breakfast  . simvastatin  10 mg Oral Daily  . vitamin C  500 mg Oral Daily   Continuous Infusions: . sodium chloride 50 mL/hr at 06/25/16 1000   PRN Meds:.acetaminophen, albuterol, benzonatate, morphine injection  Micro Results Recent Results (from the past 240 hour(s))  Urine culture     Status: Abnormal   Collection Time: 06/18/16  7:20 PM  Result Value Ref Range Status   Specimen Description URINE, CLEAN CATCH  Final   Special Requests NONE  Final   Culture MULTIPLE SPECIES PRESENT, SUGGEST RECOLLECTION (A)  Final   Report Status 06/20/2016 FINAL  Final  Culture, blood (Routine X 2)     Status: None   Collection Time: 06/18/16  8:07 PM  Result Value Ref Range Status   Specimen Description BLOOD PORTA CATH RIGHT  Final   Special Requests BOTTLES DRAWN AEROBIC AND ANAEROBIC 5 CC EACH  Final   Culture   Final    NO GROWTH 5 DAYS Performed at [facility]    Report Status 06/23/2016 FINAL  Final  Culture, blood (Routine X 2)     Status: None     Collection Time: 06/18/16  8:11 PM  Result Value Ref Range Status   Specimen Description BLOOD LEFT ANTECUBITAL  Final   Special Requests BOTTLES DRAWN AEROBIC AND ANAEROBIC 5 CC EACH  Final   Culture   Final    NO GROWTH 5 DAYS Performed at [facility]    Report Status 06/23/2016 FINAL  Final  MRSA PCR Screening     Status: None   Collection Time: 06/19/16 12:57 AM  Result Value Ref Range Status   MRSA by PCR NEGATIVE NEGATIVE Final    Comment:        The GeneXpert MRSA Assay (FDA approved for NASAL specimens only), is one component of a comprehensive MRSA colonization surveillance program. It is not intended to diagnose MRSA infection nor to guide or monitor treatment for MRSA infections.   Blood culture (routine x 2)     Status: None (Preliminary result)   Collection Time: 06/22/16  3:39 PM  Result Value Ref Range Status   Specimen Description BLOOD PORTA CATH  Final   Special Requests BOTTLES DRAWN AEROBIC AND ANAEROBIC 5CC  Final   Culture   Final    NO GROWTH 2 DAYS Performed at [facility]    Report Status PENDING  Incomplete  Blood culture (routine x 2)     Status: None (Preliminary result)   Collection Time: 06/22/16  4:30 PM  Result Value Ref Range Status   Specimen Description BLOOD BLOOD LEFT FOREARM  Final   Special Requests IN PEDIATRIC BOTTLE 3CC  Final   Culture   Final    NO GROWTH 2 DAYS Performed at [facility]  Report Status PENDING  Incomplete  MRSA PCR Screening     Status: None   Collection Time: 06/22/16  9:29 PM  Result Value Ref Range Status   MRSA by PCR NEGATIVE NEGATIVE Final    Comment:        The GeneXpert MRSA Assay (FDA approved for NASAL specimens only), is one component of a comprehensive MRSA colonization surveillance program. It is not intended to diagnose MRSA infection nor to guide or monitor treatment for MRSA infections.   Urine culture     Status: None   Collection Time: 06/22/16 11:00  PM  Result Value Ref Range Status   Specimen Description URINE, RANDOM  Final   Special Requests NONE  Final   Culture NO GROWTH Performed at [facility]   Final   Report Status 06/24/2016 FINAL  Final    Radiology Reports Dg Chest 2 View  Result Date: 06/18/2016 CLINICAL DATA:  Shortness of breath, LEFT chest pain and fever this morning. History of COPD, lung cancer. EXAM: CHEST  2 VIEW COMPARISON:  COPD and RIGHT hilar lymphadenopathy. No acute cardiopulmonary process. FINDINGS: Cardiac silhouette is normal. Fullness of the RIGHT hila corresponding to known mediastinal lymph nodes. No pleural effusion or focal consolidation. Increased lung volumes consistent with history of COPD. Single lumen RIGHT chest Port-A-Cath with distal tip projecting in proximal superior vena cava. High-riding RIGHT humeral head most compatible with old rotator cuff injury. Broad levoscoliosis. IMPRESSION: COPD and RIGHT hilar mass/lymphadenopathy without acute cardiopulmonary process. Electronically Signed   By: Elon Alas M.D.   On: 06/18/2016 20:41   Dg Chest 2 View  Result Date: 05/28/2016 CLINICAL DATA:  SOB increasing x 2 days - currently being treated for lung cancer - hx COPD - hx asthma EXAM: CHEST  2 VIEW COMPARISON:  05/07/2016 FINDINGS: The cardiac silhouette is normal in size. No mediastinal masses or convincing adenopathy. Coarse reticular type opacity extends from superior left hilum. There is fullness from the inferior right hilum. These findings stable. Lungs are hyperexpanded but otherwise clear. No pleural effusion or pneumothorax. Right anterior chest wall Port-A-Cath is stable. Bony thorax is demineralized. Mild compression fracture of a mid to upper thoracic vertebra, also stable. No convincing osteoblastic or osteolytic lesions. IMPRESSION: No acute cardiopulmonary disease. No significant change from the previous chest radiograph. Electronically Signed   By: Lajean Manes M.D.   On:  05/28/2016 14:49   Ct Chest W Contrast  Result Date: 06/11/2016 CLINICAL DATA:  Followup lung cancer. EXAM: CT CHEST, ABDOMEN, AND PELVIS WITH CONTRAST TECHNIQUE: Multidetector CT imaging of the chest, abdomen and pelvis was performed following the standard protocol during bolus administration of intravenous contrast. CONTRAST:  179m ISOVUE-300 IOPAMIDOL (ISOVUE-300) INJECTION 61% COMPARISON:  04/16/2016 FINDINGS: CT CHEST FINDINGS Cardiovascular: The heart size is normal. No pericardial effusion. There is aortic atherosclerosis. Calcification within the LAD, and left circumflex coronary artery noted. Mediastinum/Nodes: The trachea appears patent and is midline. Unremarkable appearance of the esophagus. Partially calcified anterior mediastinal, right paratracheal mass measures 3.4 x 3.4 cm, image 12 of series 5. This is compared with 3.9 x 3.7 cm previously. Sub- carinal lymph node measures 7 mm, image 24 of series 5. On the previous exam this measured 10 mm. Lungs/Pleura: No pleural effusion. Advanced changes of centrilobular and paraseptal emphysema identified. Paramediastinal radiation change within the left lung is unchanged from previous exam. The perihilar right lung lesion measures 1.8 x 1.5 cm, image 30 of series 5. On the previous exam  this measured 1.7 x 1.5 cm. Tiny nodule in the right lower lobe is unchanged measuring 2 mm, image 84 of series 4. Musculoskeletal: No aggressive lytic or sclerotic bone lesions identified. T4 compression deformity is stable from previous exam. CT ABDOMEN PELVIS FINDINGS Hepatobiliary: Stable hypervascular structure within the medial segment of left lobe of liver measuring 1 cm, image 59 of series 5. Stable 5 mm adjacent hypervascular lesion, image number 62 of series 5. No intrahepatic bile duct dilatation. The gallbladder appears normal. Pancreas: No inflammation or mass identified. Spleen: The spleen appears normal. Adrenals/Urinary Tract: The adrenal glands are  normal. Unremarkable appearance of the right kidney. There is scarring involving the inferior pole of the right kidney. Urinary bladder is unremarkable. Stomach/Bowel: The stomach is normal. The small bowel loops have a normal course and caliber. The colon is negative. Vascular/Lymphatic: Calcified atherosclerotic disease involves the abdominal aorta. No aneurysm. No enlarged retroperitoneal or mesenteric adenopathy. No enlarged pelvic or inguinal lymph nodes. Reproductive: The uterus and adnexal structures are unremarkable for patient's age. Other: There is no ascites or focal fluid collections within the abdomen or pelvis. Musculoskeletal: There is degenerative disc disease noted within the lumbar spine. 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 IMPRESSION: 1. No new or progressive metastatic disease. 2. Large anterior mediastinal right paratracheal lymph node is mildly decreased in size in the interval. There has been decrease in size of the sub- carinal lymph node. 3. Right perihilar soft tissue mass is stable to minimally increased in size in the interval. 4. Emphysema 5. Aortic atherosclerosis and coronary artery calcifications 6. Emphysema 7. Stable appearance of chronic T4 compression deformity and pagetoid disease involving the right femur. Electronically Signed   By: Kerby Moors M.D.   On: 06/11/2016 08:55   Ct Angio Chest Pe W/cm &/or Wo Cm  Result Date: 06/22/2016 CLINICAL DATA:  Worsening shortness of breath. Right lung carcinoma. COPD. EXAM: CT ANGIOGRAPHY CHEST WITH CONTRAST TECHNIQUE: Multidetector CT imaging of the chest was performed using the standard protocol during bolus administration of intravenous contrast. Multiplanar CT image reconstructions and MIPs were obtained to evaluate the vascular anatomy. CONTRAST:  100 mL Isovue 370 COMPARISON:  06/11/2016 FINDINGS: Cardiovascular: Satisfactory opacification of pulmonary arteries noted, and no pulmonary emboli identified. No  evidence of thoracic aortic dissection or aneurysm. Normal heart size. Aortic atherosclerosis. Mediastinum/Lymph Nodes: Right paratracheal lymphadenopathy measures 3.4 x 3.4 cm on image 17/4 8 shows no significant change since previous study. Subcarinal lymph node measures 14 mm on image 35/4 compared with 7 mm previously. Lungs/Pleura: Mass in the central right perihilar region currently measures 2.0 x 1.8 cm on image 45/4 compared to 1.5 x 1.8 cm previously. Right middle lobe atelectasis shows no significant change. Moderate emphysema is again demonstrated. Left lung paramediastinal radiation changes are again seen. New airspace disease is seen throughout the majority of the left lower lobe, suspicious for pneumonia. No evidence of pleural effusion. Upper abdomen: No acute findings. Musculoskeletal: No chest wall mass or suspicious bone lesions identified. Review of the MIP images confirms the above findings. IMPRESSION: No evidence of pulmonary embolism. New left lower lobe airspace disease, suspicious for pneumonia. Slight increase in size of central right perihilar lung mass, with stable peripheral right middle lobe atelectasis. Mild increase in subcarinal mediastinal lymphadenopathy. Stable right paratracheal mediastinal lymphadenopathy. Emphysema. Electronically Signed   By: Earle Gell M.D.   On: 06/22/2016 19:26   Ct Angio Chest Pe W Or Wo Contrast  Result Date: 06/01/2016  CLINICAL DATA:  Hemoptysis EXAM: CT ANGIOGRAPHY CHEST WITH CONTRAST TECHNIQUE: Multidetector CT imaging of the chest was performed using the standard protocol during bolus administration of intravenous contrast. Multiplanar CT image reconstructions and MIPs were obtained to evaluate the vascular anatomy. CONTRAST:  100 cc Isovue 370 COMPARISON:  02/12/2016 FINDINGS: There are no filling defects in the pulmonary arterial tree to suggest acute pulmonary thromboembolism. Right peritracheal mass in the upper mediastinum measures 3.2 x 3.1  cm and previously measured 6.4 x 4.5 cm. Is improved. Ill-defined calcifications persist within the mass. Post radiation changes in the medial left lung are stable. Soft tissue prominence in the medial right middle lobe have improved. A focal ground-glass opacity in the lateral basal right lower lobe on image 63 measures 10 mm. Small pleural effusions have developed. Bilateral dependent atelectasis. Partial solid nodule in the left lower lobe measures 5 mm on image 49. There is fluid or mucus material in left lower lobe airways extending to the posterior basal segment. See image 49. Severe emphysema persists. Interlobular septa are more prominent suggesting interstitial edema or pneumonia. Biapical scarring is stable. No pneumothorax. Right jugular Port-A-Cath is stable. Right ventricle is dilated. Diffuse hepatic steatosis. Calcified granuloma in the right lobe of the liver. Tiny hypodensities in the lateral spleen are nonspecific. Mid-level thoracic compression deformities are stable. This includes T4, T5, and probably T6. Review of the MIP images confirms the above findings. IMPRESSION: No evidence of acute pulmonary thromboembolism New small bilateral pleural effusions with dependent atelectasis New sub cm part solid left lower lobe nodule. New 10 mm ground-glass opacity in the right lower lobe. Central right middle lobe mass and large right paratracheal mass are improved. New interstitial changes suggesting interstitial edema or pneumonia. Electronically Signed   By: Marybelle Killings M.D.   On: 06/01/2016 09:49   Ct Abdomen Pelvis W Contrast  Result Date: 06/11/2016 CLINICAL DATA:  Followup lung cancer. EXAM: CT CHEST, ABDOMEN, AND PELVIS WITH CONTRAST TECHNIQUE: Multidetector CT imaging of the chest, abdomen and pelvis was performed following the standard protocol during bolus administration of intravenous contrast. CONTRAST:  148m ISOVUE-300 IOPAMIDOL (ISOVUE-300) INJECTION 61% COMPARISON:  04/16/2016  FINDINGS: CT CHEST FINDINGS Cardiovascular: The heart size is normal. No pericardial effusion. There is aortic atherosclerosis. Calcification within the LAD, and left circumflex coronary artery noted. Mediastinum/Nodes: The trachea appears patent and is midline. Unremarkable appearance of the esophagus. Partially calcified anterior mediastinal, right paratracheal mass measures 3.4 x 3.4 cm, image 12 of series 5. This is compared with 3.9 x 3.7 cm previously. Sub- carinal lymph node measures 7 mm, image 24 of series 5. On the previous exam this measured 10 mm. Lungs/Pleura: No pleural effusion. Advanced changes of centrilobular and paraseptal emphysema identified. Paramediastinal radiation change within the left lung is unchanged from previous exam. The perihilar right lung lesion measures 1.8 x 1.5 cm, image 30 of series 5. On the previous exam this measured 1.7 x 1.5 cm. Tiny nodule in the right lower lobe is unchanged measuring 2 mm, image 84 of series 4. Musculoskeletal: No aggressive lytic or sclerotic bone lesions identified. T4 compression deformity is stable from previous exam. CT ABDOMEN PELVIS FINDINGS Hepatobiliary: Stable hypervascular structure within the medial segment of left lobe of liver measuring 1 cm, image 59 of series 5. Stable 5 mm adjacent hypervascular lesion, image number 62 of series 5. No intrahepatic bile duct dilatation. The gallbladder appears normal. Pancreas: No inflammation or mass identified. Spleen: The spleen appears normal. Adrenals/Urinary Tract:  The adrenal glands are normal. Unremarkable appearance of the right kidney. There is scarring involving the inferior pole of the right kidney. Urinary bladder is unremarkable. Stomach/Bowel: The stomach is normal. The small bowel loops have a normal course and caliber. The colon is negative. Vascular/Lymphatic: Calcified atherosclerotic disease involves the abdominal aorta. No aneurysm. No enlarged retroperitoneal or mesenteric  adenopathy. No enlarged pelvic or inguinal lymph nodes. Reproductive: The uterus and adnexal structures are unremarkable for patient's age. Other: There is no ascites or focal fluid collections within the abdomen or pelvis. Musculoskeletal: There is degenerative disc disease noted within the lumbar spine. 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 IMPRESSION: 1. No new or progressive metastatic disease. 2. Large anterior mediastinal right paratracheal lymph node is mildly decreased in size in the interval. There has been decrease in size of the sub- carinal lymph node. 3. Right perihilar soft tissue mass is stable to minimally increased in size in the interval. 4. Emphysema 5. Aortic atherosclerosis and coronary artery calcifications 6. Emphysema 7. Stable appearance of chronic T4 compression deformity and pagetoid disease involving the right femur. Electronically Signed   By: Kerby Moors M.D.   On: 06/11/2016 08:55   Dg Chest Portable 1 View  Result Date: 06/22/2016 CLINICAL DATA:  Acute shortness of breath today. History of left lung small cell carcinoma. EXAM: PORTABLE CHEST 1 VIEW COMPARISON:  06/18/2016 FINDINGS: Upper limits normal heart size noted. New left mid lung airspace disease is identified. Scarring/ posttreatment changes in the left perihilar region again noted. There is no evidence of pneumothorax or pleural effusion. No acute bony abnormalities are present. A right Port-A-Cath with tip overlying the mid -lower SVC again noted. IMPRESSION: New left mid lung airspace opacity, likely pneumonia or aspiration. No other significant change. Electronically Signed   By: Margarette Canada M.D.   On: 06/22/2016 16:06   Dg Chest Port 1 View  Result Date: 05/31/2016 CLINICAL DATA:  Lung cancer, dyspnea EXAM: PORTABLE CHEST 1 VIEW COMPARISON:  05/28/2016 FINDINGS: Cardiomediastinal silhouette is stable. Hyperinflation again noted. Again noted right paratracheal adenopathy. Stable left perihilar  postradiation changes. No definite superimposed infiltrate or pulmonary edema. Mild perihilar bronchitic changes. Right IJ Port-A-Cath is unchanged in position. IMPRESSION: Hyperinflation again noted. Again noted right paratracheal adenopathy. Stable left perihilar postradiation changes. No definite superimposed infiltrate or pulmonary edema. Mild perihilar bronchitic changes. Electronically Signed   By: Lahoma Crocker M.D.   On: 05/31/2016 10:12     Johanne Mcglade M.D on 06/25/2016 at 10:45 AM  Between 7am to 7pm - Pager - [phone]  After 7pm go to [email or web address] - password [facility]  [facility] -  Office  [phone]

## 2016-06-25 NOTE — Evaluation (Signed)
Physical Therapy Evaluation Patient Details Name: Destiny Harrison MRN: [id number] DOB: [birth year] Today's Date: 06/25/2016   History of Present Illness  75 y.o. female with medical history significant for metastatic non-small cell lung cancer, COPD with chronic hypoxic respiratory failure, chronic pain, and GERD and admitted for sepsis due to UTI  Clinical Impression  The patient  Is very  Motivated to ambulate today. Patient reports that she plans to return home. Does accept HHPT. HR 138 maximum, saturation > 95% throughout. Pt admitted with above diagnosis. Pt currently with functional limitations due to the deficits listed below (see PT Problem List).  Pt will benefit from skilled PT to increase their independence and safety with mobility to allow discharge to the venue listed below.       Follow Up Recommendations Home health PT (patient declines SNF, accepts HHPT)    Equipment Recommendations  None recommended by PT    Recommendations for Other Services       Precautions / Restrictions Precautions Precautions: ICD/Pacemaker Precaution Comments: chronic 3L O2, high HR      Mobility  Bed Mobility Overal bed mobility: Needs Assistance Bed Mobility: Supine to Sit     Supine to sit: Min guard     General bed mobility comments: increased time   Transfers Overall transfer level: Needs assistance Equipment used: None Transfers: Sit to/from [facility] Sit to Stand: Min assist Stand pivot transfers: Min assist       General transfer comment: set up to transfer to [facility], extra tome  Ambulation/Gait Ambulation/Gait assistance: Min guard Ambulation Distance (Feet): 200 Feet Assistive device: Rolling walker (2 wheeled) Gait Pattern/deviations: Step-through pattern     General Gait Details: slow but steady gait, remained on 3L O2 [place], HR 138 during ambulation, sats > 95%, RR 35 at times. Patient determined to walk the distance.   Stairs             Wheelchair Mobility    Modified Rankin (Stroke Patients Only)       Balance Overall balance assessment: Needs assistance Sitting-balance support: Bilateral upper extremity supported;Feet supported Sitting balance-Leahy Scale: Good     Standing balance support: During functional activity;No upper extremity supported Standing balance-Leahy Scale: Fair                               Pertinent Vitals/Pain Pain Assessment: No/denies pain Pain Score: 5  Pain Location: generalized Pain Intervention(s): Limited activity within patient's tolerance;Monitored during session    [place] expects to be discharged to:: Private residence Living Arrangements: Spouse/significant other Available Help at Discharge: Family Type of Home: House       Home Layout: One level Home Equipment: Environmental consultant - 2 wheels;Cane - single point;Bedside commode      Prior Function                 Hand Dominance        Extremity/Trunk Assessment   Upper Extremity Assessment: Generalized weakness           Lower Extremity Assessment: Generalized weakness      Cervical / Trunk Assessment: Kyphotic  Communication      Cognition Arousal/Alertness: Awake/alert Behavior During Therapy: WFL for tasks assessed/performed Overall Cognitive Status: Within Functional Limits for tasks assessed                      General Comments      Exercises  Assessment/Plan    PT Assessment Patient needs continued PT services  PT Diagnosis Difficulty walking;Generalized weakness   PT Problem List Decreased strength;Decreased activity tolerance;Cardiopulmonary status limiting activity;Decreased mobility  PT Treatment Interventions DME instruction;Gait training;Functional mobility training;Therapeutic exercise;Therapeutic activities;Patient/family education   PT Goals (Current goals can be found in the Care Plan section) Acute Rehab PT Goals Patient  Stated Goal: to go home PT Goal Formulation: With patient Time For Goal Achievement: 07/09/16 Potential to Achieve Goals: Good    Frequency Min 3X/week   Barriers to discharge        Co-evaluation               End of Session Equipment Utilized During Treatment: Oxygen Activity Tolerance: Patient tolerated treatment well Patient left: in chair;with call bell/phone within reach;with chair alarm set Nurse Communication: Mobility status         Time: 8127-5170 PT Time Calculation (min) (ACUTE ONLY): 21 min   Charges:   PT Evaluation $PT Eval Low Complexity: 1 Procedure     PT G CodesClaretha Cooper 06/25/2016, 9:17 AM Tresa Endo PT [phone]

## 2016-06-25 NOTE — Progress Notes (Signed)
Pharmacy Antibiotic Note  Destiny Harrison is a 75 y.o. female with metastatic NSCLC and COPD, recently hospitalized for sepsis 2/2 UTI and discharged on 9/1, admitted on 06/22/2016 with HCAP.  Pharmacy is following for Zosyn dosing.  CXR: New left mid lung airspace opacity, likely PNA or aspiration  Plan:  Continue Zosyn 3.375g IV q8h (infuse over 4 hours)  If able to safely take PO, consider transition to Augmentin '875mg'$  BID   Height: '5\' 5"'$  (165.1 cm) Weight: 111 lb 1.8 oz (50.4 kg) IBW/kg (Calculated) : 57  Temp (24hrs), Avg:98.1 F (36.7 C), Min:97.6 F (36.4 C), Max:98.6 F (37 C)   Recent Labs Lab 06/18/16 2020 06/19/16 0002  06/20/16 0510 06/22/16 1539 06/22/16 1551 06/22/16 1800 06/22/16 2025 06/23/16 0430 06/24/16 0400 06/25/16 0500  WBC  --   --   < > 6.3 40.8*  --   --   --  28.7* 34.6* 33.4*  CREATININE  --   --   < > 0.57 0.63  --   --   --  0.52 0.56 0.48  LATICACIDVEN 1.13 0.69  --   --   --  3.29* 2.9* 1.3  --   --   --   < > = values in this interval not displayed.  Estimated Creatinine Clearance: 47.6 mL/min (by C-G formula based on SCr of 0.8 mg/dL).    Allergies  Allergen Reactions  . Shellfish Allergy Swelling    Antimicrobials this admission: 9/2 Vancomycin  >> 9/3 9/2 Cefepime x1 9/2 Zosyn >>  Dose adjustments this admission:  Microbiology results: 9/2 BCx: ngtd 9/2 UCx: NGF 9/2 MRSA PCR: negative  Thank you for allowing pharmacy to be a part of this patient's care.  Peggyann Juba, PharmD, BCPS Pager: [phone] 06/25/2016, 9:16 AM

## 2016-06-25 NOTE — Progress Notes (Signed)
SLP Cancellation Note  Patient Details Name: Lorelee Mclaurin MRN: [id number] DOB: [birth year]   Cancelled treatment:       Reason Eval/Treat Not Completed: Other (comment) (pt with staff and housekeeping service at this time, RN reports pt tolerating po and is for transfer to floor today)   Luanna Salk, [facility] SLP [phone]

## 2016-06-26 DIAGNOSIS — J449 Chronic obstructive pulmonary disease, unspecified: Secondary | ICD-10-CM

## 2016-06-26 DIAGNOSIS — L8992 Pressure ulcer of unspecified site, stage 2: Secondary | ICD-10-CM

## 2016-06-26 DIAGNOSIS — E43 Unspecified severe protein-calorie malnutrition: Secondary | ICD-10-CM

## 2016-06-26 DIAGNOSIS — A419 Sepsis, unspecified organism: Principal | ICD-10-CM

## 2016-06-26 DIAGNOSIS — R1310 Dysphagia, unspecified: Secondary | ICD-10-CM

## 2016-06-26 DIAGNOSIS — C3411 Malignant neoplasm of upper lobe, right bronchus or lung: Secondary | ICD-10-CM

## 2016-06-26 LAB — CBC
HEMATOCRIT: 28.8 % — AB (ref 36.0–46.0)
Hemoglobin: 9.6 g/dL — ABNORMAL LOW (ref 12.0–15.0)
MCH: 26.7 pg (ref 26.0–34.0)
MCHC: 33.3 g/dL (ref 30.0–36.0)
MCV: 80.2 fL (ref 78.0–100.0)
PLATELETS: 275 10*3/uL (ref 150–400)
RBC: 3.59 MIL/uL — ABNORMAL LOW (ref 3.87–5.11)
RDW: 19.5 % — AB (ref 11.5–15.5)
WBC: 31.8 10*3/uL — AB (ref 4.0–10.5)

## 2016-06-26 LAB — BASIC METABOLIC PANEL
Anion gap: 6 (ref 5–15)
BUN: 13 mg/dL (ref 6–20)
CALCIUM: 8.4 mg/dL — AB (ref 8.9–10.3)
CO2: 31 mmol/L (ref 22–32)
CREATININE: 0.52 mg/dL (ref 0.44–1.00)
Chloride: 103 mmol/L (ref 101–111)
GFR calc Af Amer: >60 mL/min (ref >60)
GLUCOSE: 118 mg/dL — AB (ref 65–99)
Potassium: 4 mmol/L (ref 3.5–5.1)
Sodium: 140 mmol/L (ref 135–145)

## 2016-06-26 MED ORDER — SODIUM CHLORIDE 0.9% FLUSH
10.0000 mL | INTRAVENOUS | Status: DC | PRN
  Administered 2016-06-26 – 2016-06-27 (×2): 10 mL
  Filled 2016-06-26 (×2): qty 40

## 2016-06-26 NOTE — Care Management Important Message (Signed)
Important Message  Patient Details  Name: Destiny Harrison MRN: [id number] Date of Birth: 12/12/1940   Medicare Important Message Given:  Yes    Camillo Flaming 06/26/2016, 11:52 AM

## 2016-06-26 NOTE — Progress Notes (Signed)
PROGRESS NOTE    Destiny Harrison  KDX:[id number] DOB: August 01, 1941 DOA: 06/22/2016  PCP: Thressa Sheller, MD   Brief Narrative:  75 y.o.female,with medical history significant formetastatic non-small cell lung cancer, COPD with chronic hypoxic respiratory failure, chronic pain, and GERD , recently discharged from [facility] due to sepsis from UTI, admitted with shortness of breath and cough secondary to aspiration, Initially requiring BiPAP, but currently back to baseline 3 L nasal cannula.  Subjective: Cough is improving. Shortness of breath improving as well.  Assessment & Plan:   Acute on chronic hypoxic respiratory failure - She is on baseline 3 L nasal cannula, secondary to COPD disease and lung cancer. - Requiring BiPAP initially, significantly improved, back to baseline - CTA chest negative for PE  Sepsis secondary to aspiration pneumonia - Meets sepsis criteria on admission giving tachypnea, fever, tachycardia, and leukocytosis, with elevated lactic acid   - lactic acid trending down, pro-calcitonin within normal limits  Leukocytosis -  Multifactorial due to sepsis, Neulasta and steroids.  Aspiration pneumonia - She reports her symptoms started  after she was having breakfast eating egg at home, will she felt choking on it, CT chest significant for left lower lobe opacity. - Initially on vancomycin and Zosyn, stopped vancomycin on 9/3 ,continue with Zosyn, hopefully can be transitioned to Augmentin tomorrow - blood cultures, so far no growth to date  Dysphagia - Seen by SLP, moderate risk for aspiration, continue with regular thin liquid diet  Chronic muscular skeletal chest pain - Patient with reproducible left-sided chest pain, chronic  Metastatic NSCLC  - Followed by Dr. Julien Nordmann of oncology in the outpatient setting   COPD - Discharged recently on prednisone taper,  - Initially on IV steroids, wheezing significantly improved - transitioned to  prednisone- continue with nebs   Normocytic anemia  - Attributed to chemotherapy and managed with pegfilgrastim, last given on 06/15/16  - Glycohemoglobin is 7.3 today, will transfuse 1 unit PRBC  GERD  - PPI when able to swallow  Chronic pain  - Attributed to OA and cancer,  - Continue home regimen of long-acting morphine with Norco prn breakthrough pain   Protein calorie malnutrition - Continue with supplements  DVT prophylaxis: Heparin Code Status: DNR Family Communication: husband Disposition Plan: home tomorrow Consultants:    Procedures:    Antimicrobials:  Anti-infectives    Start     Dose/Rate Route Frequency Ordered Stop   06/23/16 0600  vancomycin (VANCOCIN) 500 mg in sodium chloride 0.9 % 100 mL IVPB  Status:  Discontinued     500 mg 100 mL/hr over 60 Minutes Intravenous Every 12 hours 06/22/16 1744 06/23/16 1214   06/22/16 2200  piperacillin-tazobactam (ZOSYN) IVPB 3.375 g     3.375 g 12.5 mL/hr over 240 Minutes Intravenous Every 8 hours 06/22/16 1745     06/22/16 1600  ceFEPIme (MAXIPIME) 2 g in dextrose 5 % 50 mL IVPB     2 g 100 mL/hr over 30 Minutes Intravenous  Once 06/22/16 1556 06/22/16 1730   06/22/16 1600  vancomycin (VANCOCIN) IVPB 1000 mg/200 mL premix     1,000 mg 200 mL/hr over 60 Minutes Intravenous  Once 06/22/16 1556 06/22/16 1828       Objective: Vitals:   06/26/16 0422 06/26/16 0755 06/26/16 0759 06/26/16 1439  BP: (!) 148/72     Pulse: 90   (!) 127  Resp: 20   (!) 24  Temp: 97.6 F (36.4 C)     TempSrc: Oral  SpO2: 100% 97% 97% 92%  Weight:      Height:        Intake/Output Summary (Last 24 hours) at 06/26/16 1510 Last data filed at 06/26/16 0600  Gross per 24 hour  Intake              850 ml  Output                0 ml  Net              850 ml   Filed Weights   06/22/16 1613 06/22/16 2200 06/25/16 1413  Weight: 51.4 kg (113 lb 4.8 oz) 50.4 kg (111 lb 1.8 oz) 53.1 kg (117 lb 1.6 oz)    Examination: General  exam: Appears comfortable  HEENT: PERRLA, oral mucosa moist, no sclera icterus or thrush Respiratory system: Clear to auscultation. Respiratory effort normal. Cardiovascular system: S1 & S2 heard, RRR.  No murmurs  Gastrointestinal system: Abdomen soft, non-tender, nondistended. Normal bowel sound. No organomegaly Central nervous system: Alert and oriented. No focal neurological deficits. Extremities: No cyanosis, clubbing or edema Skin: No rashes or ulcers Psychiatry:  Mood & affect appropriate.     Data Reviewed: I have personally reviewed following labs and imaging studies  CBC:  Recent Labs Lab 06/20/16 0510 06/22/16 1539 06/23/16 0430 06/24/16 0400 06/25/16 0500 06/26/16 0630  WBC 6.3 40.8* 28.7* 34.6* 33.4* 31.8*  NEUTROABS 5.0 38.0*  --   --  32.0*  --   HGB 8.4* 8.7* 8.1* 7.3* 9.0* 9.6*  HCT 25.6* 26.7* 24.7* 22.0* 26.8* 28.8*  MCV 79.8 79.5 79.4 78.9 80.5 80.2  PLT 163 192 162 205 221 169   Basic Metabolic Panel:  Recent Labs Lab 06/20/16 0510 06/22/16 1539 06/23/16 0430 06/24/16 0400 06/25/16 0500 06/26/16 0630  NA 139 140 139 142 140 140  K 4.8 4.3 3.9 3.4* 3.6 4.0  CL 104 100* 102 105 106 103  CO2 31 32 '30 31 29 31  '$ GLUCOSE 152* 159* 174* 168* 154* 118*  BUN '17 16 11 13 12 13  '$ CREATININE 0.57 0.63 0.52 0.56 0.48 0.52  CALCIUM 8.2* 9.4 8.3* 7.8* 7.9* 8.4*  MG 2.4  --   --   --   --   --   PHOS 2.5  --   --   --   --   --    GFR: Estimated Creatinine Clearance: 50.2 mL/min (by C-G formula based on SCr of 0.8 mg/dL). Liver Function Tests:  Recent Labs Lab 06/22/16 1539  AST 17  ALT 18  ALKPHOS 146*  BILITOT 0.8  PROT 6.0*  ALBUMIN 2.8*   No results for input(s): LIPASE, AMYLASE in the last 168 hours. No results for input(s): AMMONIA in the last 168 hours. Coagulation Profile:  Recent Labs Lab 06/22/16 1800  INR 1.07   Cardiac Enzymes: No results for input(s): CKTOTAL, CKMB, CKMBINDEX, TROPONINI in the last 168 hours. BNP (last 3  results) No results for input(s): PROBNP in the last 8760 hours. HbA1C: No results for input(s): HGBA1C in the last 72 hours. CBG: No results for input(s): GLUCAP in the last 168 hours. Lipid Profile: No results for input(s): CHOL, HDL, LDLCALC, TRIG, CHOLHDL, LDLDIRECT in the last 72 hours. Thyroid Function Tests: No results for input(s): TSH, T4TOTAL, FREET4, T3FREE, THYROIDAB in the last 72 hours. Anemia Panel: No results for input(s): VITAMINB12, FOLATE, FERRITIN, TIBC, IRON, RETICCTPCT in the last 72 hours. Urine analysis:    Component Value Date/Time  COLORURINE YELLOW 06/22/2016 2302   APPEARANCEUR CLEAR 06/22/2016 2302   LABSPEC 1.014 06/22/2016 2302   PHURINE 7.5 06/22/2016 2302   GLUCOSEU NEGATIVE 06/22/2016 2302   HGBUR NEGATIVE 06/22/2016 2302   BILIRUBINUR NEGATIVE 06/22/2016 2302   KETONESUR NEGATIVE 06/22/2016 2302   PROTEINUR NEGATIVE 06/22/2016 2302   UROBILINOGEN 0.2 08/18/2015 0730   NITRITE NEGATIVE 06/22/2016 2302   LEUKOCYTESUR NEGATIVE 06/22/2016 2302   Sepsis Labs: '@LABRCNTIP'$ (procalcitonin:4,lacticidven:4) ) Recent Results (from the past 240 hour(s))  Urine culture     Status: Abnormal   Collection Time: 06/18/16  7:20 PM  Result Value Ref Range Status   Specimen Description URINE, CLEAN CATCH  Final   Special Requests NONE  Final   Culture MULTIPLE SPECIES PRESENT, SUGGEST RECOLLECTION (A)  Final   Report Status 06/20/2016 FINAL  Final  Culture, blood (Routine X 2)     Status: None   Collection Time: 06/18/16  8:07 PM  Result Value Ref Range Status   Specimen Description BLOOD PORTA CATH RIGHT  Final   Special Requests BOTTLES DRAWN AEROBIC AND ANAEROBIC 5 CC EACH  Final   Culture   Final    NO GROWTH 5 DAYS Performed at [facility]    Report Status 06/23/2016 FINAL  Final  Culture, blood (Routine X 2)     Status: None   Collection Time: 06/18/16  8:11 PM  Result Value Ref Range Status   Specimen Description BLOOD LEFT ANTECUBITAL   Final   Special Requests BOTTLES DRAWN AEROBIC AND ANAEROBIC 5 CC EACH  Final   Culture   Final    NO GROWTH 5 DAYS Performed at [facility]    Report Status 06/23/2016 FINAL  Final  MRSA PCR Screening     Status: None   Collection Time: 06/19/16 12:57 AM  Result Value Ref Range Status   MRSA by PCR NEGATIVE NEGATIVE Final    Comment:        The GeneXpert MRSA Assay (FDA approved for NASAL specimens only), is one component of a comprehensive MRSA colonization surveillance program. It is not intended to diagnose MRSA infection nor to guide or monitor treatment for MRSA infections.   Blood culture (routine x 2)     Status: None (Preliminary result)   Collection Time: 06/22/16  3:39 PM  Result Value Ref Range Status   Specimen Description BLOOD PORTA CATH  Final   Special Requests BOTTLES DRAWN AEROBIC AND ANAEROBIC 5CC  Final   Culture   Final    NO GROWTH 3 DAYS Performed at [facility]    Report Status PENDING  Incomplete  Blood culture (routine x 2)     Status: None (Preliminary result)   Collection Time: 06/22/16  4:30 PM  Result Value Ref Range Status   Specimen Description BLOOD BLOOD LEFT FOREARM  Final   Special Requests IN PEDIATRIC BOTTLE 3CC  Final   Culture   Final    NO GROWTH 3 DAYS Performed at [facility]    Report Status PENDING  Incomplete  MRSA PCR Screening     Status: None   Collection Time: 06/22/16  9:29 PM  Result Value Ref Range Status   MRSA by PCR NEGATIVE NEGATIVE Final    Comment:        The GeneXpert MRSA Assay (FDA approved for NASAL specimens only), is one component of a comprehensive MRSA colonization surveillance program. It is not intended to diagnose MRSA infection nor to guide or monitor  treatment for MRSA infections.   Urine culture     Status: None   Collection Time: 06/22/16 11:00 PM  Result Value Ref Range Status   Specimen Description URINE, RANDOM  Final   Special Requests NONE  Final    Culture NO GROWTH Performed at [facility]   Final   Report Status 06/24/2016 FINAL  Final         Radiology Studies: No results found.    Scheduled Meds: . budesonide  0.25 mg Nebulization BID  . calcium-vitamin D  1 tablet Oral Daily  . cholecalciferol  1,000 Units Oral Daily  . diltiazem  240 mg Oral QPM  . famotidine  20 mg Oral BID  . folic acid  732 mcg Oral Daily  . guaiFENesin  600 mg Oral BID  . heparin  5,000 Units Subcutaneous Q8H  . ipratropium-albuterol  3 mL Nebulization TID  . lactose free nutrition  237 mL Oral BID BM  . magic mouthwash w/lidocaine  5 mL Oral QID  . magnesium oxide  400 mg Oral BID  . morphine  15 mg Oral Q12H  . pantoprazole  80 mg Oral Daily  . piperacillin-tazobactam (ZOSYN)  IV  3.375 g Intravenous Q8H  . predniSONE  30 mg Oral Q breakfast  . simvastatin  10 mg Oral Daily  . vitamin C  500 mg Oral Daily   Continuous Infusions: . sodium chloride 75 mL/hr at 06/26/16 1140     LOS: 4 days    Time spent in minutes: 56    [place], MD [facility] Pager: [email or web address] Password [email or web address] 06/26/2016, 3:10 PM

## 2016-06-27 ENCOUNTER — Other Ambulatory Visit: Payer: Medicare Other

## 2016-06-27 DIAGNOSIS — J9620 Acute and chronic respiratory failure, unspecified whether with hypoxia or hypercapnia: Secondary | ICD-10-CM

## 2016-06-27 DIAGNOSIS — J9621 Acute and chronic respiratory failure with hypoxia: Secondary | ICD-10-CM

## 2016-06-27 DIAGNOSIS — J690 Pneumonitis due to inhalation of food and vomit: Secondary | ICD-10-CM

## 2016-06-27 LAB — CULTURE, BLOOD (ROUTINE X 2)
CULTURE: NO GROWTH
CULTURE: NO GROWTH

## 2016-06-27 MED ORDER — PREDNISONE 10 MG PO TABS: ORAL_TABLET | ORAL | 0 refills | Status: DC

## 2016-06-27 MED ORDER — HEPARIN SOD (PORK) LOCK FLUSH 100 UNIT/ML IV SOLN
500.0000 [IU] | INTRAVENOUS | Status: AC | PRN
  Administered 2016-06-27: 500 [IU]

## 2016-06-27 MED ORDER — AMOXICILLIN-POT CLAVULANATE 875-125 MG PO TABS: 1.0000 | ORAL_TABLET | Freq: Two times a day (BID) | ORAL | 0 refills | Status: DC

## 2016-06-27 MED ORDER — AMOXICILLIN-POT CLAVULANATE 875-125 MG PO TABS
1.0000 | ORAL_TABLET | Freq: Two times a day (BID) | ORAL | Status: DC
  Administered 2016-06-27: 1 via ORAL
  Filled 2016-06-27: qty 1

## 2016-06-27 NOTE — Discharge Summary (Signed)
Physician Discharge Summary  Destiny Harrison IPJ:[id number] DOB: 24-Apr-1941 DOA: 06/22/2016  PCP: Thressa Sheller, MD  Admit date: 06/22/2016 Discharge date: 06/27/2016  Admitted From: home  Disposition:  home   Recommendations for Outpatient Follow-up:  1. CBC in 1 wk for leukocytosis  Home Health:  PT  Equipment/Devices:  none    Discharge Condition:  stable   CODE STATUS:  DNR   Diet recommendation:  Heart healthy Consultations:  none    Discharge Diagnoses:  Principal Problem:   Aspiration pneumonia ([place]) Active Problems:   Acute on chronic respiratory failure (HCC)   COPD, moderate ([place])   Primary cancer of right upper lobe of lung ([place])   Dysphagia   Protein-calorie malnutrition, severe   Sepsis ([place])   Pressure ulcer stage II    Subjective: Mild cough with pale yellow sputum. No chest pain. No dyspnea on exertion. No nausea, vomiting diarrhea.   Brief Summary: 75 y.o.female,with medical history significant formetastatic non-small cell lung cancer, COPD with chronic hypoxic respiratory failure, chronic pain, and GERD , recently discharged from [facility] long hospital due to sepsis from UTI, admitted with shortness of breath and cough secondary to aspiration and initially required a BiPAP for respiratory distress.   Hospital Course:   Aspiration pneumonia - She reports her symptoms started after she choked on breakfast (eating egg at home) -  CT chest significant for left lower lobe opacity suspicious for pneumonia - Initially on vancomycin and Zosyn, stoppedvancomycin on 9/3 - Zosyn transitioned to Augmentin today - blood cultures,so far no growth to date  COPD exacerbation - Discharged recently on prednisone taper,  - Initially on IV steroids-  wheezing significantly improved - transitioned to prednisone   Acute on chronic hypoxic respiratory failure - at baseline on 3 L nasal cannula secondary to COPD and lung cancer. - Requiring BiPAP initially,  significantly improved, back to baseline - CTA chest negative for PE  Sepsis secondary to aspiration pneumonia - Meets sepsis criteria on admission giving tachypnea, fever, tachycardia, and leukocytosis, with elevated lactic acid  - lactic acid trending down, pro-calcitonin within normal limits  Leukocytosis -  Multifactorial due to sepsis, Neulasta and steroids.  Dysphagia - Seen by SLP, moderate risk for aspiration- continue with regular thin liquid diet per speech recommendations  Deconditioning and sinus tachycardia on exertion - HHPT ordered   Chronic muscular skeletal chest pain - Patient with reproducible left-sided chest pain, chronic  Metastatic NSCLC  - Followed by Dr. Julien Nordmann of oncology in the outpatient setting   Normocytic anemia  - Attributed to chemotherapy and managed with pegfilgrastim, last given on 06/15/16  -  Transfused 1 unit PRBC for a Hb of 7.3 on 9/4- Hb yesterday was 9.6  Chronic pain  - Attributed to OA and cancer,  - Continue home regimen of long-acting morphine with Norco prn breakthrough pain   Protein calorie malnutrition - Continue with supplements   Discharge Instructions  Discharge Instructions    Diet - low sodium heart healthy    Complete by:  As directed   Increase activity slowly    Complete by:  As directed       Medication List    STOP taking these medications   cephALEXin 500 MG capsule Commonly known as:  KEFLEX     TAKE these medications   albuterol 108 (90 Base) MCG/ACT inhaler Commonly known as:  PROAIR HFA Inhale 2 puffs into the lungs every 6 (six) hours as needed for wheezing or shortness of breath.  amoxicillin-clavulanate 875-125 MG tablet Commonly known as:  AUGMENTIN Take 1 tablet by mouth every 12 (twelve) hours.   benzonatate 100 MG capsule Commonly known as:  TESSALON Take 100 mg by mouth every 8 (eight) hours as needed for cough.   budesonide 0.25 MG/2ML nebulizer solution Commonly  known as:  PULMICORT Take 2 mLs (0.25 mg total) by nebulization 2 (two) times daily.   calcium-vitamin D 500-200 MG-UNIT tablet Commonly known as:  OSCAL WITH D Take 1 tablet by mouth daily.   dexamethasone 4 MG tablet Commonly known as:  DECADRON Take 8 mg by mouth 2 (two) times daily. The day before, of, and the day after chemo   diltiazem 240 MG 24 hr capsule Commonly known as:  DILACOR XR Take 1 capsule (240 mg total) by mouth every evening.   famotidine 20 MG tablet Commonly known as:  PEPCID Take 1 tablet (20 mg total) by mouth 2 (two) times daily.   folic acid 409 MCG tablet Commonly known as:  FOLVITE Take 400 mcg by mouth daily.   furosemide 20 MG tablet Commonly known as:  LASIX Take 20 mg by mouth daily.   guaiFENesin 600 MG 12 hr tablet Commonly known as:  MUCINEX Take 1 tablet (600 mg total) by mouth 2 (two) times daily. Please start in 3 days.   HYDROcodone-acetaminophen 5-325 MG tablet Commonly known as:  NORCO/VICODIN Take 1 tablet by mouth every 4 (four) hours as needed for moderate pain.   IRON PO Take 1 tablet by mouth daily.   lactose free nutrition Liqd Take 237 mLs by mouth 2 (two) times daily between meals.   magnesium oxide 400 (241.3 Mg) MG tablet Commonly known as:  MAG-OX Take 1 tablet (400 mg total) by mouth 2 (two) times daily.   metoCLOPramide 10 MG tablet Commonly known as:  REGLAN Take 10 mg by mouth every 6 (six) hours as needed for nausea or vomiting.   morphine 15 MG 12 hr tablet Commonly known as:  MS CONTIN Take 1 tablet (15 mg total) by mouth every 12 (twelve) hours.   ondansetron 8 MG disintegrating tablet Commonly known as:  ZOFRAN-ODT DISSOLVE 1 TABLET BY MOUTH EVERY 8 HOURS AS NEEDED FOR NAUSEA AND VOMITING   OXYGEN Inhale 2 L into the lungs continuous.   pantoprazole 40 MG tablet Commonly known as:  PROTONIX Take 80 mg by mouth daily.   polyethylene glycol packet Commonly known as:  MIRALAX / GLYCOLAX MX AND  DRK 1 PACKET PO QD MIXED WITH 8 OUNCES OF FLUID   potassium chloride SA 20 MEQ tablet Commonly known as:  K-DUR,KLOR-CON Take 1 tablet (20 mEq total) by mouth daily.   predniSONE 10 MG tablet Commonly known as:  DELTASONE Take 20 mg tomorrow, 10 mg the next day, 5 mg for 2 days and then stop What changed:  additional instructions  Another medication with the same name was removed. Continue taking this medication, and follow the directions you see here.   simvastatin 10 MG tablet Commonly known as:  ZOCOR Take 10 mg by mouth daily.   VITAMIN C PO Take 1 tablet by mouth daily.   VITAMIN D PO Take 1 tablet by mouth daily.       Allergies  Allergen Reactions  . Shellfish Allergy Swelling     Procedures/Studies:   Dg Chest 2 View  Result Date: 06/18/2016 CLINICAL DATA:  Shortness of breath, LEFT chest pain and fever this morning. History of COPD, lung cancer. EXAM:  CHEST  2 VIEW COMPARISON:  COPD and RIGHT hilar lymphadenopathy. No acute cardiopulmonary process. FINDINGS: Cardiac silhouette is normal. Fullness of the RIGHT hila corresponding to known mediastinal lymph nodes. No pleural effusion or focal consolidation. Increased lung volumes consistent with history of COPD. Single lumen RIGHT chest Port-A-Cath with distal tip projecting in proximal superior vena cava. High-riding RIGHT humeral head most compatible with old rotator cuff injury. Broad levoscoliosis. IMPRESSION: COPD and RIGHT hilar mass/lymphadenopathy without acute cardiopulmonary process. Electronically Signed   By: Elon Alas M.D.   On: 06/18/2016 20:41   Dg Chest 2 View  Result Date: 05/28/2016 CLINICAL DATA:  SOB increasing x 2 days - currently being treated for lung cancer - hx COPD - hx asthma EXAM: CHEST  2 VIEW COMPARISON:  05/07/2016 FINDINGS: The cardiac silhouette is normal in size. No mediastinal masses or convincing adenopathy. Coarse reticular type opacity extends from superior left hilum.  There is fullness from the inferior right hilum. These findings stable. Lungs are hyperexpanded but otherwise clear. No pleural effusion or pneumothorax. Right anterior chest wall Port-A-Cath is stable. Bony thorax is demineralized. Mild compression fracture of a mid to upper thoracic vertebra, also stable. No convincing osteoblastic or osteolytic lesions. IMPRESSION: No acute cardiopulmonary disease. No significant change from the previous chest radiograph. Electronically Signed   By: Lajean Manes M.D.   On: 05/28/2016 14:49   Ct Chest W Contrast  Result Date: 06/11/2016 CLINICAL DATA:  Followup lung cancer. EXAM: CT CHEST, ABDOMEN, AND PELVIS WITH CONTRAST TECHNIQUE: Multidetector CT imaging of the chest, abdomen and pelvis was performed following the standard protocol during bolus administration of intravenous contrast. CONTRAST:  171m ISOVUE-300 IOPAMIDOL (ISOVUE-300) INJECTION 61% COMPARISON:  04/16/2016 FINDINGS: CT CHEST FINDINGS Cardiovascular: The heart size is normal. No pericardial effusion. There is aortic atherosclerosis. Calcification within the LAD, and left circumflex coronary artery noted. Mediastinum/Nodes: The trachea appears patent and is midline. Unremarkable appearance of the esophagus. Partially calcified anterior mediastinal, right paratracheal mass measures 3.4 x 3.4 cm, image 12 of series 5. This is compared with 3.9 x 3.7 cm previously. Sub- carinal lymph node measures 7 mm, image 24 of series 5. On the previous exam this measured 10 mm. Lungs/Pleura: No pleural effusion. Advanced changes of centrilobular and paraseptal emphysema identified. Paramediastinal radiation change within the left lung is unchanged from previous exam. The perihilar right lung lesion measures 1.8 x 1.5 cm, image 30 of series 5. On the previous exam this measured 1.7 x 1.5 cm. Tiny nodule in the right lower lobe is unchanged measuring 2 mm, image 84 of series 4. Musculoskeletal: No aggressive lytic or  sclerotic bone lesions identified. T4 compression deformity is stable from previous exam. CT ABDOMEN PELVIS FINDINGS Hepatobiliary: Stable hypervascular structure within the medial segment of left lobe of liver measuring 1 cm, image 59 of series 5. Stable 5 mm adjacent hypervascular lesion, image number 62 of series 5. No intrahepatic bile duct dilatation. The gallbladder appears normal. Pancreas: No inflammation or mass identified. Spleen: The spleen appears normal. Adrenals/Urinary Tract: The adrenal glands are normal. Unremarkable appearance of the right kidney. There is scarring involving the inferior pole of the right kidney. Urinary bladder is unremarkable. Stomach/Bowel: The stomach is normal. The small bowel loops have a normal course and caliber. The colon is negative. Vascular/Lymphatic: Calcified atherosclerotic disease involves the abdominal aorta. No aneurysm. No enlarged retroperitoneal or mesenteric adenopathy. No enlarged pelvic or inguinal lymph nodes. Reproductive: The uterus and adnexal structures are unremarkable for  patient's age. Other: There is no ascites or focal fluid collections within the abdomen or pelvis. Musculoskeletal: There is degenerative disc disease noted within the lumbar spine. 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 IMPRESSION: 1. No new or progressive metastatic disease. 2. Large anterior mediastinal right paratracheal lymph node is mildly decreased in size in the interval. There has been decrease in size of the sub- carinal lymph node. 3. Right perihilar soft tissue mass is stable to minimally increased in size in the interval. 4. Emphysema 5. Aortic atherosclerosis and coronary artery calcifications 6. Emphysema 7. Stable appearance of chronic T4 compression deformity and pagetoid disease involving the right femur. Electronically Signed   By: Kerby Moors M.D.   On: 06/11/2016 08:55   Ct Angio Chest Pe W/cm &/or Wo Cm  Result Date: 06/22/2016 CLINICAL  DATA:  Worsening shortness of breath. Right lung carcinoma. COPD. EXAM: CT ANGIOGRAPHY CHEST WITH CONTRAST TECHNIQUE: Multidetector CT imaging of the chest was performed using the standard protocol during bolus administration of intravenous contrast. Multiplanar CT image reconstructions and MIPs were obtained to evaluate the vascular anatomy. CONTRAST:  100 mL Isovue 370 COMPARISON:  06/11/2016 FINDINGS: Cardiovascular: Satisfactory opacification of pulmonary arteries noted, and no pulmonary emboli identified. No evidence of thoracic aortic dissection or aneurysm. Normal heart size. Aortic atherosclerosis. Mediastinum/Lymph Nodes: Right paratracheal lymphadenopathy measures 3.4 x 3.4 cm on image 17/4 8 shows no significant change since previous study. Subcarinal lymph node measures 14 mm on image 35/4 compared with 7 mm previously. Lungs/Pleura: Mass in the central right perihilar region currently measures 2.0 x 1.8 cm on image 45/4 compared to 1.5 x 1.8 cm previously. Right middle lobe atelectasis shows no significant change. Moderate emphysema is again demonstrated. Left lung paramediastinal radiation changes are again seen. New airspace disease is seen throughout the majority of the left lower lobe, suspicious for pneumonia. No evidence of pleural effusion. Upper abdomen: No acute findings. Musculoskeletal: No chest wall mass or suspicious bone lesions identified. Review of the MIP images confirms the above findings. IMPRESSION: No evidence of pulmonary embolism. New left lower lobe airspace disease, suspicious for pneumonia. Slight increase in size of central right perihilar lung mass, with stable peripheral right middle lobe atelectasis. Mild increase in subcarinal mediastinal lymphadenopathy. Stable right paratracheal mediastinal lymphadenopathy. Emphysema. Electronically Signed   By: Earle Gell M.D.   On: 06/22/2016 19:26   Ct Angio Chest Pe W Or Wo Contrast  Result Date: 06/01/2016 CLINICAL DATA:   Hemoptysis EXAM: CT ANGIOGRAPHY CHEST WITH CONTRAST TECHNIQUE: Multidetector CT imaging of the chest was performed using the standard protocol during bolus administration of intravenous contrast. Multiplanar CT image reconstructions and MIPs were obtained to evaluate the vascular anatomy. CONTRAST:  100 cc Isovue 370 COMPARISON:  02/12/2016 FINDINGS: There are no filling defects in the pulmonary arterial tree to suggest acute pulmonary thromboembolism. Right peritracheal mass in the upper mediastinum measures 3.2 x 3.1 cm and previously measured 6.4 x 4.5 cm. Is improved. Ill-defined calcifications persist within the mass. Post radiation changes in the medial left lung are stable. Soft tissue prominence in the medial right middle lobe have improved. A focal ground-glass opacity in the lateral basal right lower lobe on image 63 measures 10 mm. Small pleural effusions have developed. Bilateral dependent atelectasis. Partial solid nodule in the left lower lobe measures 5 mm on image 49. There is fluid or mucus material in left lower lobe airways extending to the posterior basal segment. See image 49. Severe emphysema  persists. Interlobular septa are more prominent suggesting interstitial edema or pneumonia. Biapical scarring is stable. No pneumothorax. Right jugular Port-A-Cath is stable. Right ventricle is dilated. Diffuse hepatic steatosis. Calcified granuloma in the right lobe of the liver. Tiny hypodensities in the lateral spleen are nonspecific. Mid-level thoracic compression deformities are stable. This includes T4, T5, and probably T6. Review of the MIP images confirms the above findings. IMPRESSION: No evidence of acute pulmonary thromboembolism New small bilateral pleural effusions with dependent atelectasis New sub cm part solid left lower lobe nodule. New 10 mm ground-glass opacity in the right lower lobe. Central right middle lobe mass and large right paratracheal mass are improved. New interstitial changes  suggesting interstitial edema or pneumonia. Electronically Signed   By: Marybelle Killings M.D.   On: 06/01/2016 09:49   Ct Abdomen Pelvis W Contrast  Result Date: 06/11/2016 CLINICAL DATA:  Followup lung cancer. EXAM: CT CHEST, ABDOMEN, AND PELVIS WITH CONTRAST TECHNIQUE: Multidetector CT imaging of the chest, abdomen and pelvis was performed following the standard protocol during bolus administration of intravenous contrast. CONTRAST:  11m ISOVUE-300 IOPAMIDOL (ISOVUE-300) INJECTION 61% COMPARISON:  04/16/2016 FINDINGS: CT CHEST FINDINGS Cardiovascular: The heart size is normal. No pericardial effusion. There is aortic atherosclerosis. Calcification within the LAD, and left circumflex coronary artery noted. Mediastinum/Nodes: The trachea appears patent and is midline. Unremarkable appearance of the esophagus. Partially calcified anterior mediastinal, right paratracheal mass measures 3.4 x 3.4 cm, image 12 of series 5. This is compared with 3.9 x 3.7 cm previously. Sub- carinal lymph node measures 7 mm, image 24 of series 5. On the previous exam this measured 10 mm. Lungs/Pleura: No pleural effusion. Advanced changes of centrilobular and paraseptal emphysema identified. Paramediastinal radiation change within the left lung is unchanged from previous exam. The perihilar right lung lesion measures 1.8 x 1.5 cm, image 30 of series 5. On the previous exam this measured 1.7 x 1.5 cm. Tiny nodule in the right lower lobe is unchanged measuring 2 mm, image 84 of series 4. Musculoskeletal: No aggressive lytic or sclerotic bone lesions identified. T4 compression deformity is stable from previous exam. CT ABDOMEN PELVIS FINDINGS Hepatobiliary: Stable hypervascular structure within the medial segment of left lobe of liver measuring 1 cm, image 59 of series 5. Stable 5 mm adjacent hypervascular lesion, image number 62 of series 5. No intrahepatic bile duct dilatation. The gallbladder appears normal. Pancreas: No inflammation or  mass identified. Spleen: The spleen appears normal. Adrenals/Urinary Tract: The adrenal glands are normal. Unremarkable appearance of the right kidney. There is scarring involving the inferior pole of the right kidney. Urinary bladder is unremarkable. Stomach/Bowel: The stomach is normal. The small bowel loops have a normal course and caliber. The colon is negative. Vascular/Lymphatic: Calcified atherosclerotic disease involves the abdominal aorta. No aneurysm. No enlarged retroperitoneal or mesenteric adenopathy. No enlarged pelvic or inguinal lymph nodes. Reproductive: The uterus and adnexal structures are unremarkable for patient's age. Other: There is no ascites or focal fluid collections within the abdomen or pelvis. Musculoskeletal: There is degenerative disc disease noted within the lumbar spine. 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 IMPRESSION: 1. No new or progressive metastatic disease. 2. Large anterior mediastinal right paratracheal lymph node is mildly decreased in size in the interval. There has been decrease in size of the sub- carinal lymph node. 3. Right perihilar soft tissue mass is stable to minimally increased in size in the interval. 4. Emphysema 5. Aortic atherosclerosis and coronary artery calcifications 6. Emphysema 7.  Stable appearance of chronic T4 compression deformity and pagetoid disease involving the right femur. Electronically Signed   By: Kerby Moors M.D.   On: 06/11/2016 08:55   Dg Chest Portable 1 View  Result Date: 06/22/2016 CLINICAL DATA:  Acute shortness of breath today. History of left lung small cell carcinoma. EXAM: PORTABLE CHEST 1 VIEW COMPARISON:  06/18/2016 FINDINGS: Upper limits normal heart size noted. New left mid lung airspace disease is identified. Scarring/ posttreatment changes in the left perihilar region again noted. There is no evidence of pneumothorax or pleural effusion. No acute bony abnormalities are present. A right Port-A-Cath with  tip overlying the mid -lower SVC again noted. IMPRESSION: New left mid lung airspace opacity, likely pneumonia or aspiration. No other significant change. Electronically Signed   By: Margarette Canada M.D.   On: 06/22/2016 16:06   Dg Chest Port 1 View  Result Date: 05/31/2016 CLINICAL DATA:  Lung cancer, dyspnea EXAM: PORTABLE CHEST 1 VIEW COMPARISON:  05/28/2016 FINDINGS: Cardiomediastinal silhouette is stable. Hyperinflation again noted. Again noted right paratracheal adenopathy. Stable left perihilar postradiation changes. No definite superimposed infiltrate or pulmonary edema. Mild perihilar bronchitic changes. Right IJ Port-A-Cath is unchanged in position. IMPRESSION: Hyperinflation again noted. Again noted right paratracheal adenopathy. Stable left perihilar postradiation changes. No definite superimposed infiltrate or pulmonary edema. Mild perihilar bronchitic changes. Electronically Signed   By: Lahoma Crocker M.D.   On: 05/31/2016 10:12       Discharge Exam: Vitals:   06/26/16 2020 06/27/16 0425  BP: (!) 155/24 (!) 157/90  Pulse: (!) 119 (!) 120  Resp: (!) 24 (!) 24  Temp: 98.3 F (36.8 C) 98.4 F (36.9 C)   Vitals:   06/26/16 2020 06/27/16 0425 06/27/16 0841 06/27/16 0844  BP: (!) 155/24 (!) 157/90    Pulse: (!) 119 (!) 120    Resp: (!) 24 (!) 24    Temp: 98.3 F (36.8 C) 98.4 F (36.9 C)    TempSrc: Oral Oral    SpO2: 100% 100% 96% 96%  Weight:      Height:        General: Pt is alert, awake, not in acute distress Cardiovascular: RRR, S1/S2 +, no rubs, no gallops Respiratory: CTA bilaterally, no wheezing, no rhonchi Abdominal: Soft, NT, ND, bowel sounds + Extremities: no edema, no cyanosis    The results of significant diagnostics from this hospitalization (including imaging, microbiology, ancillary and laboratory) are listed below for reference.     Microbiology: Recent Results (from the past 240 hour(s))  Urine culture     Status: Abnormal   Collection Time: 06/18/16   7:20 PM  Result Value Ref Range Status   Specimen Description URINE, CLEAN CATCH  Final   Special Requests NONE  Final   Culture MULTIPLE SPECIES PRESENT, SUGGEST RECOLLECTION (A)  Final   Report Status 06/20/2016 FINAL  Final  Culture, blood (Routine X 2)     Status: None   Collection Time: 06/18/16  8:07 PM  Result Value Ref Range Status   Specimen Description BLOOD PORTA CATH RIGHT  Final   Special Requests BOTTLES DRAWN AEROBIC AND ANAEROBIC 5 CC EACH  Final   Culture   Final    NO GROWTH 5 DAYS Performed at [facility]    Report Status 06/23/2016 FINAL  Final  Culture, blood (Routine X 2)     Status: None   Collection Time: 06/18/16  8:11 PM  Result Value Ref Range Status   Specimen Description BLOOD  LEFT ANTECUBITAL  Final   Special Requests BOTTLES DRAWN AEROBIC AND ANAEROBIC 5 CC EACH  Final   Culture   Final    NO GROWTH 5 DAYS Performed at [facility]    Report Status 06/23/2016 FINAL  Final  MRSA PCR Screening     Status: None   Collection Time: 06/19/16 12:57 AM  Result Value Ref Range Status   MRSA by PCR NEGATIVE NEGATIVE Final    Comment:        The GeneXpert MRSA Assay (FDA approved for NASAL specimens only), is one component of a comprehensive MRSA colonization surveillance program. It is not intended to diagnose MRSA infection nor to guide or monitor treatment for MRSA infections.   Blood culture (routine x 2)     Status: None (Preliminary result)   Collection Time: 06/22/16  3:39 PM  Result Value Ref Range Status   Specimen Description BLOOD PORTA CATH  Final   Special Requests BOTTLES DRAWN AEROBIC AND ANAEROBIC 5CC  Final   Culture   Final    NO GROWTH 4 DAYS Performed at [facility]    Report Status PENDING  Incomplete  Blood culture (routine x 2)     Status: None (Preliminary result)   Collection Time: 06/22/16  4:30 PM  Result Value Ref Range Status   Specimen Description BLOOD BLOOD LEFT FOREARM  Final   Special  Requests IN PEDIATRIC BOTTLE 3CC  Final   Culture   Final    NO GROWTH 4 DAYS Performed at [facility]    Report Status PENDING  Incomplete  MRSA PCR Screening     Status: None   Collection Time: 06/22/16  9:29 PM  Result Value Ref Range Status   MRSA by PCR NEGATIVE NEGATIVE Final    Comment:        The GeneXpert MRSA Assay (FDA approved for NASAL specimens only), is one component of a comprehensive MRSA colonization surveillance program. It is not intended to diagnose MRSA infection nor to guide or monitor treatment for MRSA infections.   Urine culture     Status: None   Collection Time: 06/22/16 11:00 PM  Result Value Ref Range Status   Specimen Description URINE, RANDOM  Final   Special Requests NONE  Final   Culture NO GROWTH Performed at [facility]   Final   Report Status 06/24/2016 FINAL  Final     Labs: BNP (last 3 results)  Recent Labs  03/26/16 1242 06/22/16 1539  BNP 183.6* 161.0*   Basic Metabolic Panel:  Recent Labs Lab 06/22/16 1539 06/23/16 0430 06/24/16 0400 06/25/16 0500 06/26/16 0630  NA 140 139 142 140 140  K 4.3 3.9 3.4* 3.6 4.0  CL 100* 102 105 106 103  CO2 32 '30 31 29 31  '$ GLUCOSE 159* 174* 168* 154* 118*  BUN '16 11 13 12 13  '$ CREATININE 0.63 0.52 0.56 0.48 0.52  CALCIUM 9.4 8.3* 7.8* 7.9* 8.4*   Liver Function Tests:  Recent Labs Lab 06/22/16 1539  AST 17  ALT 18  ALKPHOS 146*  BILITOT 0.8  PROT 6.0*  ALBUMIN 2.8*   No results for input(s): LIPASE, AMYLASE in the last 168 hours. No results for input(s): AMMONIA in the last 168 hours. CBC:  Recent Labs Lab 06/22/16 1539 06/23/16 0430 06/24/16 0400 06/25/16 0500 06/26/16 0630  WBC 40.8* 28.7* 34.6* 33.4* 31.8*  NEUTROABS 38.0*  --   --  32.0*  --   HGB 8.7*  8.1* 7.3* 9.0* 9.6*  HCT 26.7* 24.7* 22.0* 26.8* 28.8*  MCV 79.5 79.4 78.9 80.5 80.2  PLT 192 162 205 221 275   Cardiac Enzymes: No results for input(s): CKTOTAL, CKMB, CKMBINDEX,  TROPONINI in the last 168 hours. BNP: Invalid input(s): POCBNP CBG: No results for input(s): GLUCAP in the last 168 hours. D-Dimer No results for input(s): DDIMER in the last 72 hours. Hgb A1c No results for input(s): HGBA1C in the last 72 hours. Lipid Profile No results for input(s): CHOL, HDL, LDLCALC, TRIG, CHOLHDL, LDLDIRECT in the last 72 hours. Thyroid function studies No results for input(s): TSH, T4TOTAL, T3FREE, THYROIDAB in the last 72 hours.  Invalid input(s): FREET3 Anemia work up No results for input(s): VITAMINB12, FOLATE, FERRITIN, TIBC, IRON, RETICCTPCT in the last 72 hours. Urinalysis    Component Value Date/Time   COLORURINE YELLOW 06/22/2016 2302   APPEARANCEUR CLEAR 06/22/2016 2302   LABSPEC 1.014 06/22/2016 2302   PHURINE 7.5 06/22/2016 2302   GLUCOSEU NEGATIVE 06/22/2016 2302   HGBUR NEGATIVE 06/22/2016 2302   BILIRUBINUR NEGATIVE 06/22/2016 2302   KETONESUR NEGATIVE 06/22/2016 2302   PROTEINUR NEGATIVE 06/22/2016 2302   UROBILINOGEN 0.2 08/18/2015 0730   NITRITE NEGATIVE 06/22/2016 2302   LEUKOCYTESUR NEGATIVE 06/22/2016 2302   Sepsis Labs Invalid input(s): PROCALCITONIN,  WBC,  LACTICIDVEN Microbiology Recent Results (from the past 240 hour(s))  Urine culture     Status: Abnormal   Collection Time: 06/18/16  7:20 PM  Result Value Ref Range Status   Specimen Description URINE, CLEAN CATCH  Final   Special Requests NONE  Final   Culture MULTIPLE SPECIES PRESENT, SUGGEST RECOLLECTION (A)  Final   Report Status 06/20/2016 FINAL  Final  Culture, blood (Routine X 2)     Status: None   Collection Time: 06/18/16  8:07 PM  Result Value Ref Range Status   Specimen Description BLOOD PORTA CATH RIGHT  Final   Special Requests BOTTLES DRAWN AEROBIC AND ANAEROBIC 5 CC EACH  Final   Culture   Final    NO GROWTH 5 DAYS Performed at [facility]    Report Status 06/23/2016 FINAL  Final  Culture, blood (Routine X 2)     Status: None   Collection  Time: 06/18/16  8:11 PM  Result Value Ref Range Status   Specimen Description BLOOD LEFT ANTECUBITAL  Final   Special Requests BOTTLES DRAWN AEROBIC AND ANAEROBIC 5 CC EACH  Final   Culture   Final    NO GROWTH 5 DAYS Performed at [facility]    Report Status 06/23/2016 FINAL  Final  MRSA PCR Screening     Status: None   Collection Time: 06/19/16 12:57 AM  Result Value Ref Range Status   MRSA by PCR NEGATIVE NEGATIVE Final    Comment:        The GeneXpert MRSA Assay (FDA approved for NASAL specimens only), is one component of a comprehensive MRSA colonization surveillance program. It is not intended to diagnose MRSA infection nor to guide or monitor treatment for MRSA infections.   Blood culture (routine x 2)     Status: None (Preliminary result)   Collection Time: 06/22/16  3:39 PM  Result Value Ref Range Status   Specimen Description BLOOD PORTA CATH  Final   Special Requests BOTTLES DRAWN AEROBIC AND ANAEROBIC 5CC  Final   Culture   Final    NO GROWTH 4 DAYS Performed at [facility]    Report Status PENDING  Incomplete  Blood culture (routine x 2)     Status: None (Preliminary result)   Collection Time: 06/22/16  4:30 PM  Result Value Ref Range Status   Specimen Description BLOOD BLOOD LEFT FOREARM  Final   Special Requests IN PEDIATRIC BOTTLE 3CC  Final   Culture   Final    NO GROWTH 4 DAYS Performed at [facility]    Report Status PENDING  Incomplete  MRSA PCR Screening     Status: None   Collection Time: 06/22/16  9:29 PM  Result Value Ref Range Status   MRSA by PCR NEGATIVE NEGATIVE Final    Comment:        The GeneXpert MRSA Assay (FDA approved for NASAL specimens only), is one component of a comprehensive MRSA colonization surveillance program. It is not intended to diagnose MRSA infection nor to guide or monitor treatment for MRSA infections.   Urine culture     Status: None   Collection Time: 06/22/16 11:00 PM  Result  Value Ref Range Status   Specimen Description URINE, RANDOM  Final   Special Requests NONE  Final   Culture NO GROWTH Performed at [facility]   Final   Report Status 06/24/2016 FINAL  Final     Time coordinating discharge: Over 30 minutes  SIGNED:   Debbe Odea, MD  [facility] 06/27/2016, 10:24 AM Pager   If 7PM-7AM, please contact night-coverage [email or web address] Password [email or web address]

## 2016-06-27 NOTE — Care Management Note (Signed)
Case Management Note  Patient Details  Name: Destiny Harrison MRN: [id number] Date of Birth: 25-Jan-1941  Subjective/Objective:  D/c home w/HHPT, alrady active w/[facility] aware of d/c,HHC, & High risk initiative since a readmit. Home 02-has travel tank.                 Action/Plan:d/c home w/HHC/HRI   Expected Discharge Date:                  Expected Discharge Plan:  [place]  In-House Referral:     Discharge planning Services  CM Consult  Post Acute Care Choice:  Home Health, Durable Medical Equipment (active w/HHPT;has home 02-travel tank-[facility]) Choice offered to:  Patient  DME Arranged:    DME Agency:     HH Arranged:  PT HH Agency:     Status of Service:  Completed, signed off  If discussed at [place] of Stay Meetings, dates discussed:    Additional Comments:  Dessa Phi, RN 06/27/2016, 11:23 AM

## 2016-06-27 NOTE — Progress Notes (Signed)
Pt given discharge teaching and instructions. Medications reviewed with Pt. Pt verbalizes understanding of all discharge teaching. Discharge Packet with Pt at time of discharge

## 2016-06-28 ENCOUNTER — Other Ambulatory Visit: Payer: Self-pay | Admitting: *Deleted

## 2016-06-28 ENCOUNTER — Encounter: Payer: Self-pay | Admitting: *Deleted

## 2016-06-28 DIAGNOSIS — L89152 Pressure ulcer of sacral region, stage 2: Secondary | ICD-10-CM | POA: Diagnosis not present

## 2016-06-28 DIAGNOSIS — J440 Chronic obstructive pulmonary disease with acute lower respiratory infection: Secondary | ICD-10-CM | POA: Diagnosis not present

## 2016-06-28 DIAGNOSIS — D649 Anemia, unspecified: Secondary | ICD-10-CM | POA: Diagnosis not present

## 2016-06-28 DIAGNOSIS — J45909 Unspecified asthma, uncomplicated: Secondary | ICD-10-CM | POA: Diagnosis not present

## 2016-06-28 DIAGNOSIS — M069 Rheumatoid arthritis, unspecified: Secondary | ICD-10-CM | POA: Diagnosis not present

## 2016-06-28 DIAGNOSIS — R1310 Dysphagia, unspecified: Secondary | ICD-10-CM | POA: Diagnosis not present

## 2016-06-28 DIAGNOSIS — C3411 Malignant neoplasm of upper lobe, right bronchus or lung: Secondary | ICD-10-CM | POA: Diagnosis not present

## 2016-06-28 DIAGNOSIS — J9620 Acute and chronic respiratory failure, unspecified whether with hypoxia or hypercapnia: Secondary | ICD-10-CM | POA: Diagnosis not present

## 2016-06-28 DIAGNOSIS — E43 Unspecified severe protein-calorie malnutrition: Secondary | ICD-10-CM | POA: Diagnosis not present

## 2016-06-28 DIAGNOSIS — J690 Pneumonitis due to inhalation of food and vomit: Secondary | ICD-10-CM | POA: Diagnosis not present

## 2016-06-28 DIAGNOSIS — J441 Chronic obstructive pulmonary disease with (acute) exacerbation: Secondary | ICD-10-CM | POA: Diagnosis not present

## 2016-06-28 DIAGNOSIS — L989 Disorder of the skin and subcutaneous tissue, unspecified: Secondary | ICD-10-CM | POA: Diagnosis not present

## 2016-06-28 NOTE — Patient Outreach (Addendum)
[place] [facility]) Care Management  06/28/2016  Destiny Harrison 12/01/1940 [id number]    RN spoke with pt and introduced the [facility] services and available programs for enrollment. Pt receptive and remembers talking to the hospital [facility] liaison concerning [place].  Pt states she has also received a visit from [facility] on yesterday and was provided a lot of written material with a planned follow up visit on Sunday. RN explained [facility] will interfere with the [facility] services and there is no expense to enroll into [facility] program ( pt with understanding). Based upon this time being a convenient time to speak with pt RN inquired further on her medical condition and recent discharge from the hospital. Pt verified her medications when reviewed and confirmed enough supplies with no needed refills. Briefly discussed COPD and the action plan for responding immediately with distress symptoms. Began teachings on the GREEN zone and verified pt's inhalers and nebulizer use. Further information will be verified on next telephonic call. RN also verified pt has a supportvie family with her spouse available to assist. RN explained the purpose of today's contact and offered to continue telephonic follow ups over the next 30 days to inquired on her ongoing recovery (pt agreed). Based upon today's discussion will generate a plan of care and discussed prevention measures, attending all medical appointments and taking her prescribed medications as instructed upon her recent discharge. RN also offered home visits around her home health scheduled to further engage with her ongoing management of care. Pt indicated she will received another visit from the [facility] on Sunday and have a better understanding on when [facility] will be visiting. RN offered to follow up on Monday to possibly scheduled the initial home visit for [facility] services and (pt agreed). RN also inquired on talking with pt a little long to completed an initial assessment which  involved a series of questions to better understanding her needs. Pt agreed to due this on Monday.  Will contact pt on Monday and follow up accordingly. No questions or other inquires at this time.  Patient was recently discharged from hospital and all medications have been reviewed.   Raina Mina, RN Care Management Coordinator [place] Office [phone]

## 2016-06-30 DIAGNOSIS — J441 Chronic obstructive pulmonary disease with (acute) exacerbation: Secondary | ICD-10-CM | POA: Diagnosis not present

## 2016-06-30 DIAGNOSIS — L89152 Pressure ulcer of sacral region, stage 2: Secondary | ICD-10-CM | POA: Diagnosis not present

## 2016-06-30 DIAGNOSIS — J690 Pneumonitis due to inhalation of food and vomit: Secondary | ICD-10-CM | POA: Diagnosis not present

## 2016-06-30 DIAGNOSIS — J440 Chronic obstructive pulmonary disease with acute lower respiratory infection: Secondary | ICD-10-CM | POA: Diagnosis not present

## 2016-06-30 DIAGNOSIS — R1310 Dysphagia, unspecified: Secondary | ICD-10-CM | POA: Diagnosis not present

## 2016-06-30 DIAGNOSIS — C3411 Malignant neoplasm of upper lobe, right bronchus or lung: Secondary | ICD-10-CM | POA: Diagnosis not present

## 2016-07-01 ENCOUNTER — Encounter: Payer: Self-pay | Admitting: *Deleted

## 2016-07-01 ENCOUNTER — Other Ambulatory Visit: Payer: Self-pay | Admitting: *Deleted

## 2016-07-01 DIAGNOSIS — C3411 Malignant neoplasm of upper lobe, right bronchus or lung: Secondary | ICD-10-CM | POA: Diagnosis not present

## 2016-07-01 DIAGNOSIS — J440 Chronic obstructive pulmonary disease with acute lower respiratory infection: Secondary | ICD-10-CM | POA: Diagnosis not present

## 2016-07-01 DIAGNOSIS — L89152 Pressure ulcer of sacral region, stage 2: Secondary | ICD-10-CM | POA: Diagnosis not present

## 2016-07-01 DIAGNOSIS — R1310 Dysphagia, unspecified: Secondary | ICD-10-CM | POA: Diagnosis not present

## 2016-07-01 DIAGNOSIS — J441 Chronic obstructive pulmonary disease with (acute) exacerbation: Secondary | ICD-10-CM | POA: Diagnosis not present

## 2016-07-01 DIAGNOSIS — J690 Pneumonitis due to inhalation of food and vomit: Secondary | ICD-10-CM | POA: Diagnosis not present

## 2016-07-01 NOTE — Patient Outreach (Signed)
[place] [facility]) Care Management  07/01/2016  Destiny Harrison 06/05/1941 [id number]   RN spoke with pt and reintroduced the [facility] program. Based upon the conversation with [facility] and the upcoming appointment with Jeanne Ivan. Pt remembered and receptive to the appointment for enrollment into that program and aware that she will not be eligible for the [facility] services at this time. Pt very grateful and thankful for the understanding of services. Case will be closed via [facility] with no additional calls due to Hospice involvement.   Raina Mina, RN Care Management Coordinator [place] Office [phone]

## 2016-07-01 NOTE — Patient Outreach (Signed)
[place] [facility]) Care Management  07/01/2016  Destiny Harrison 12-Mar-1941 [id number]   RN received a call from Jeanne Ivan with [facility] who indicated pt was active and pending an home visit soon. RN briefly discussed pt's understanding of Hospice and may be confused over the involved services. RN offered to follow up with pt once again and explained she has a pending appointment and will be discharged from the [facility] services due to her involvement with Hospice. RN verified agency has access to the note today if needed for details on pt's understanding and/or confusion of the pending program and services with Hospice. No other inquiries or request. RN will follow up with pt and explain the discharge via [facility] services.   Raina Mina, RN Care Management Coordinator [place] Office [phone]

## 2016-07-01 NOTE — Patient Outreach (Signed)
[place] [facility]) Care Management  07/01/2016  Destiny Harrison 1941/09/21 [id number]  RN spoke in detail with pt and completed the initial assessment and transition of care contact today. Pt reports she continues to receive chemotherapy and confirms she is a DNR states. Pt reports [facility] has evaluated and decided pt only needs one PT visit prior to her discharge from [facility]. RN offered home visit for further insite on her ongoing medical issues however pt declined but receptive to ongoing transition of care contacts over the next month.  Pt reports she has an appointment with her lung provider on 9/12 and her primary provider on 9/29. Pt verifies she has her medications organized, transportation provider by her husband or her son and enough food supplies and assistance in the home for supportive efforts. Pt continues to verified her medications with Mucinex for a productive cough with green/yellowish sputum with ongoing administration of her Z-pack antibiotic. Pt denies any fevers or chills at this time with no other issues reported. Pt states she continues to wear her home O2 24/7 with no problems or delays. Due to pt opting to decline [facility] services for home visits RN inquired if pt was aware of the services provided by palliative care. Pt indicated she is still considering this services. Pt aware that RN has access to theses agencies if needed. Pt further discussed the plan of care and possible via ongoing transition of care with [facility]. Pt verbalized an understanding and again receptive to transition of care calls. Will arrange next telephone call for ongoing transition of care contacts.  Patient was recently discharged from hospital and all medications have been reviewed.  Raina Mina, RN Care Management Coordinator [place] Office [phone]

## 2016-07-02 ENCOUNTER — Encounter: Payer: Self-pay | Admitting: Internal Medicine

## 2016-07-02 ENCOUNTER — Ambulatory Visit (INDEPENDENT_AMBULATORY_CARE_PROVIDER_SITE_OTHER): Payer: Medicare Other | Admitting: Internal Medicine

## 2016-07-02 VITALS — BP 132/62 | HR 110 | Ht 65.0 in | Wt 114.0 lb

## 2016-07-02 DIAGNOSIS — J449 Chronic obstructive pulmonary disease, unspecified: Secondary | ICD-10-CM | POA: Diagnosis not present

## 2016-07-02 DIAGNOSIS — R5381 Other malaise: Secondary | ICD-10-CM | POA: Diagnosis not present

## 2016-07-02 DIAGNOSIS — J9611 Chronic respiratory failure with hypoxia: Secondary | ICD-10-CM

## 2016-07-02 DIAGNOSIS — J440 Chronic obstructive pulmonary disease with acute lower respiratory infection: Secondary | ICD-10-CM | POA: Diagnosis not present

## 2016-07-02 DIAGNOSIS — J690 Pneumonitis due to inhalation of food and vomit: Secondary | ICD-10-CM

## 2016-07-02 DIAGNOSIS — L89152 Pressure ulcer of sacral region, stage 2: Secondary | ICD-10-CM | POA: Diagnosis not present

## 2016-07-02 DIAGNOSIS — J441 Chronic obstructive pulmonary disease with (acute) exacerbation: Secondary | ICD-10-CM | POA: Diagnosis not present

## 2016-07-02 DIAGNOSIS — C3411 Malignant neoplasm of upper lobe, right bronchus or lung: Secondary | ICD-10-CM | POA: Diagnosis not present

## 2016-07-02 DIAGNOSIS — R1310 Dysphagia, unspecified: Secondary | ICD-10-CM | POA: Diagnosis not present

## 2016-07-02 NOTE — Patient Instructions (Addendum)
ICD-9-CM ICD-10-CM   1. Chronic hypoxemic respiratory failure (HCC) 518.83 J96.11    799.02    2. COPD, severe ([place]) 496 J44.9   3. Aspiration pneumonia of left lower lobe due to regurgitated food ([place]) 507.0 J69.0   4. Physical deconditioning 799.3 R53.81     Glad you are better but you are very weak after hospitalization and still breathing fast  Plan - - glad you are doing home PT or going to start - will requalify you for o2 - discuss goals of care including chemo v hospice with Dr Julien Nordmann - finish antibiotics per hospital dc instruction - continue o2 and other copd treatment as before  followup 1 month with APP or sooner if needed

## 2016-07-02 NOTE — Progress Notes (Signed)
Subjective:     Patient ID: Destiny Harrison, female   DOB: 1940/10/29, 75 y.o.   MRN: [id number]  PCP Thressa Sheller, MD  HPI   OV 07/02/2016  Chief Complaint  Patient presents with  . Follow-up    Patient needs qualifications for O2 renewed.  SOB.  coughing up different colored mucus (red, yellow, gray), gets blood clots coming out of nose.    Advanced COPD patient. She also has metastatic lung cancer and is on immunotherapy every 3 weeks since 2016. In early September 2017 which is last week she choked on food and developed aspiration pneumonia in the left lower lobe. This is confirmed on CT chest at admission which showed new left lower lobe infiltrates compared to the end of August 2017. She's been treated with antibiotics per her history and also review of the discharge summary from the hospitalist. She is no better. She is in several liters of oxygen. She is here for routine follow-up. She still has colored mucous and some hemoptysis but all this is getting better. She is physically deconditionedbut this is also improving. She is home physical therapy starting. Apparently hospice was offered to her in the hospital but she is contemplating this. At this point in time she is more inclined to get 1 more infusion on immunotherapy with Dr. Julien Nordmann.labs from 06/26/2016 show normal creatinine.    has a past medical history of Asthma; COPD (chronic obstructive pulmonary disease) ([place]); DNR (do not resuscitate) (06/26/2015); Dysphagia (12/07/2015); Encounter for antineoplastic immunotherapy (05/22/2015); Hiatal hernia; History of chemotherapy; History of radiation therapy (03/06/2012); History of radiation therapy (05/10/2013-05/31/2013); Hyperlipidemia; Neutropenia, drug-induced ([place]) (05/05/2012); Non-small cell lung cancer ([place]) (dx'd 08/28/11); Primary cancer of right upper lobe of lung ([place]) (02/06/2012); Radiation (11/15/13-11/26/13); and Rheumatoid arthritis(714.0).   reports that she quit smoking about 6  years ago. Her smoking use included Cigarettes. She has a 20.00 pack-year smoking history. She has never used smokeless tobacco.  Past Surgical History:  Procedure Laterality Date  . appendex  1962  . surgery on right wrist    . VIDEO BRONCHOSCOPY  01/28/2012   Procedure: VIDEO BRONCHOSCOPY WITHOUT FLUORO;  Surgeon: Brand Males, MD;  Location: [facility] ENDOSCOPY;  Service: Endoscopy;  Laterality: Bilateral;    Allergies  Allergen Reactions  . Shellfish Allergy Swelling    Immunization History  Administered Date(s) Administered  . Influenza Split 07/23/2012  . Influenza Whole 09/25/2005, 09/23/2011  . Influenza,inj,Quad PF,36+ Mos 06/29/2013, 08/16/2014  . Influenza-Unspecified 06/22/2015  . Pneumococcal Conjugate-13 08/31/2015  . Pneumococcal Polysaccharide-23 11/18/2011    Family History  Problem Relation Age of Onset  . Diabetes Mother     insulin dependent  . Breast cancer Mother      Current Outpatient Prescriptions:  .  albuterol (PROAIR HFA) 108 (90 BASE) MCG/ACT inhaler, Inhale 2 puffs into the lungs every 6 (six) hours as needed for wheezing or shortness of breath., Disp: 1 Inhaler, Rfl: 5 .  amoxicillin-clavulanate (AUGMENTIN) 875-125 MG tablet, Take 1 tablet by mouth every 12 (twelve) hours., Disp: 8 tablet, Rfl: 0 .  Ascorbic Acid (VITAMIN C PO), Take 1 tablet by mouth daily., Disp: , Rfl:  .  benzonatate (TESSALON) 100 MG capsule, Take 100 mg by mouth every 8 (eight) hours as needed for cough. , Disp: , Rfl: 1 .  budesonide (PULMICORT) 0.25 MG/2ML nebulizer solution, Take 2 mLs (0.25 mg total) by nebulization 2 (two) times daily., Disp: 60 mL, Rfl: 12 .  calcium-vitamin D (OSCAL WITH D) 500-200 MG-UNIT per  tablet, Take 1 tablet by mouth daily., Disp: , Rfl:  .  Cholecalciferol (VITAMIN D PO), Take 1 tablet by mouth daily., Disp: , Rfl:  .  dexamethasone (DECADRON) 4 MG tablet, Take 8 mg by mouth 2 (two) times daily. The day before, of, and the day after chemo, Disp:  , Rfl:  .  diltiazem (DILACOR XR) 240 MG 24 hr capsule, Take 1 capsule (240 mg total) by mouth every evening., Disp: 30 capsule, Rfl: 0 .  famotidine (PEPCID) 20 MG tablet, Take 1 tablet (20 mg total) by mouth 2 (two) times daily., Disp: 30 tablet, Rfl: 0 .  folic acid (FOLVITE) 433 MCG tablet, Take 400 mcg by mouth daily., Disp: , Rfl:  .  furosemide (LASIX) 20 MG tablet, Take 20 mg by mouth daily. , Disp: , Rfl: 5 .  guaiFENesin (MUCINEX) 600 MG 12 hr tablet, Take 1 tablet (600 mg total) by mouth 2 (two) times daily. Please start in 3 days., Disp: 30 tablet, Rfl: 1 .  HYDROcodone-acetaminophen (NORCO/VICODIN) 5-325 MG tablet, Take 1 tablet by mouth every 4 (four) hours as needed for moderate pain., Disp: 60 tablet, Rfl: 0 .  IRON PO, Take 1 tablet by mouth daily., Disp: , Rfl:  .  lactose free nutrition (BOOST PLUS) LIQD, Take 237 mLs by mouth 2 (two) times daily between meals., Disp: 14 Can, Rfl: 0 .  magnesium oxide (MAG-OX) 400 (241.3 Mg) MG tablet, Take 1 tablet (400 mg total) by mouth 2 (two) times daily., Disp: 60 tablet, Rfl: 0 .  metoCLOPramide (REGLAN) 10 MG tablet, Take 10 mg by mouth every 6 (six) hours as needed for nausea or vomiting. , Disp: , Rfl: 0 .  morphine (MS CONTIN) 15 MG 12 hr tablet, Take 1 tablet (15 mg total) by mouth every 12 (twelve) hours., Disp: 60 tablet, Rfl: 0 .  ondansetron (ZOFRAN-ODT) 8 MG disintegrating tablet, DISSOLVE 1 TABLET BY MOUTH EVERY 8 HOURS AS NEEDED FOR NAUSEA AND VOMITING, Disp: 20 tablet, Rfl: 0 .  OXYGEN, Inhale 3 L into the lungs continuous. , Disp: , Rfl:  .  pantoprazole (PROTONIX) 40 MG tablet, Take 80 mg by mouth daily. , Disp: , Rfl:  .  polyethylene glycol (MIRALAX / GLYCOLAX) packet, MX AND DRK 1 PACKET PO QD MIXED WITH 8 OUNCES OF FLUID, Disp: , Rfl: 0 .  potassium chloride SA (K-DUR,KLOR-CON) 20 MEQ tablet, Take 1 tablet (20 mEq total) by mouth daily., Disp: 7 tablet, Rfl: 0 .  predniSONE (DELTASONE) 10 MG tablet, Take 20 mg tomorrow,  10 mg the next day, 5 mg for 2 days and then stop, Disp: 4 tablet, Rfl: 0 .  simvastatin (ZOCOR) 10 MG tablet, Take 10 mg by mouth daily., Disp: , Rfl:    Review of Systems     Objective:   Physical Exam  Vitals:   07/02/16 0946  BP: 132/62  Pulse: (!) 110  SpO2: 92%  Weight: 114 lb (51.7 kg)  Height: '5\' 5"'$  (1.651 m)   General exam: Frail female sitting on the wheelchair. Looks significantly more deconditioned thanI'm known her in the past Respiratory exam: Tachypneic but still able to complete full sentences. Purse lip breathing. Oxygen on clear to auscultation bilaterally Cardiovascular: Normal heart sounds and mildly tachycardic. Regular rate and rhythm Abdomen soft nontender Extremity: Mild edema present Skin exam: Intact in the exposed areas Musculoskeletal: Looks physically deconditioned. Sitting on the wheelchair     Assessment:       ICD-9-CM ICD-10-CM  1. Chronic hypoxemic respiratory failure (HCC) 518.83 J96.11    799.02    2. COPD, severe ([place]) 496 J44.9   3. Aspiration pneumonia of left lower lobe due to regurgitated food ([place]) 507.0 J69.0   4. Physical deconditioning 799.3 R53.81        Plan:      Glad you are better but you are very weak after hospitalization and still breathing fast  Plan - - glad you are doing home PT or going to start - will requalify you for o2 - discuss goals of care including chemo v hospice with Dr Julien Nordmann - finish antibiotics per hospital dc instruction - continue o2 and other copd treatment as before  followup 1 month with APP or sooner if needed   > 50% of this > 25 min visit spent in face to face counseling or coordination of care   Dr. Brand Males, M.D., [place].C.P Pulmonary and Critical Care Medicine Staff Physician [place] [facility] Pager: [phone], If no answer or between  15:00h - 7:00h: call [phone]  07/02/2016 10:13 AM

## 2016-07-03 ENCOUNTER — Other Ambulatory Visit: Payer: Medicare Other

## 2016-07-03 DIAGNOSIS — C3411 Malignant neoplasm of upper lobe, right bronchus or lung: Secondary | ICD-10-CM | POA: Diagnosis not present

## 2016-07-03 DIAGNOSIS — J441 Chronic obstructive pulmonary disease with (acute) exacerbation: Secondary | ICD-10-CM | POA: Diagnosis not present

## 2016-07-03 DIAGNOSIS — J440 Chronic obstructive pulmonary disease with acute lower respiratory infection: Secondary | ICD-10-CM | POA: Diagnosis not present

## 2016-07-03 DIAGNOSIS — L89152 Pressure ulcer of sacral region, stage 2: Secondary | ICD-10-CM | POA: Diagnosis not present

## 2016-07-03 DIAGNOSIS — R1310 Dysphagia, unspecified: Secondary | ICD-10-CM | POA: Diagnosis not present

## 2016-07-03 DIAGNOSIS — J690 Pneumonitis due to inhalation of food and vomit: Secondary | ICD-10-CM | POA: Diagnosis not present

## 2016-07-04 ENCOUNTER — Ambulatory Visit: Payer: Medicare Other

## 2016-07-04 ENCOUNTER — Ambulatory Visit (HOSPITAL_BASED_OUTPATIENT_CLINIC_OR_DEPARTMENT_OTHER): Payer: Medicare Other | Admitting: Internal Medicine

## 2016-07-04 ENCOUNTER — Encounter: Payer: Self-pay | Admitting: Internal Medicine

## 2016-07-04 ENCOUNTER — Other Ambulatory Visit (HOSPITAL_BASED_OUTPATIENT_CLINIC_OR_DEPARTMENT_OTHER): Payer: Medicare Other

## 2016-07-04 DIAGNOSIS — C3411 Malignant neoplasm of upper lobe, right bronchus or lung: Secondary | ICD-10-CM

## 2016-07-04 DIAGNOSIS — Z95828 Presence of other vascular implants and grafts: Secondary | ICD-10-CM

## 2016-07-04 DIAGNOSIS — R52 Pain, unspecified: Secondary | ICD-10-CM

## 2016-07-04 DIAGNOSIS — R531 Weakness: Secondary | ICD-10-CM

## 2016-07-04 DIAGNOSIS — R0602 Shortness of breath: Secondary | ICD-10-CM | POA: Diagnosis not present

## 2016-07-04 DIAGNOSIS — R05 Cough: Secondary | ICD-10-CM | POA: Diagnosis not present

## 2016-07-04 LAB — COMPREHENSIVE METABOLIC PANEL
ALT: 17 U/L (ref 0–55)
ANION GAP: 11 meq/L (ref 3–11)
AST: 12 U/L (ref 5–34)
Albumin: 2.3 g/dL — ABNORMAL LOW (ref 3.5–5.0)
Alkaline Phosphatase: 95 U/L (ref 40–150)
BUN: 16.4 mg/dL (ref 7.0–26.0)
CALCIUM: 8.6 mg/dL (ref 8.4–10.4)
CHLORIDE: 97 meq/L — AB (ref 98–109)
CO2: 33 meq/L — AB (ref 22–29)
CREATININE: 0.7 mg/dL (ref 0.6–1.1)
EGFR: >90 mL/min/{1.73_m2} (ref >90)
Glucose: 161 mg/dl — ABNORMAL HIGH (ref 70–140)
POTASSIUM: 3.7 meq/L (ref 3.5–5.1)
Sodium: 142 mEq/L (ref 136–145)
Total Bilirubin: 0.44 mg/dL (ref 0.20–1.20)
Total Protein: 5.7 g/dL — ABNORMAL LOW (ref 6.4–8.3)

## 2016-07-04 LAB — CBC WITH DIFFERENTIAL/PLATELET
BASO%: 0.1 % (ref 0.0–2.0)
BASOS ABS: 0 10*3/uL (ref 0.0–0.1)
EOS%: 0 % (ref 0.0–7.0)
Eosinophils Absolute: 0 10*3/uL (ref 0.0–0.5)
HEMATOCRIT: 26.2 % — AB (ref 34.8–46.6)
HGB: 8.4 g/dL — ABNORMAL LOW (ref 11.6–15.9)
LYMPH%: 3.1 % — AB (ref 14.0–49.7)
MCH: 26.6 pg (ref 25.1–34.0)
MCHC: 32 g/dL (ref 31.5–36.0)
MCV: 82.9 fL (ref 79.5–101.0)
MONO#: 0.3 10*3/uL (ref 0.1–0.9)
MONO%: 1.8 % (ref 0.0–14.0)
NEUT#: 16.9 10*3/uL — ABNORMAL HIGH (ref 1.5–6.5)
NEUT%: 95 % — AB (ref 38.4–76.8)
Platelets: 323 10*3/uL (ref 145–400)
RBC: 3.16 10*6/uL — AB (ref 3.70–5.45)
RDW: 21.3 % — ABNORMAL HIGH (ref 11.2–14.5)
WBC: 17.7 10*3/uL — ABNORMAL HIGH (ref 3.9–10.3)
lymph#: 0.6 10*3/uL — ABNORMAL LOW (ref 0.9–3.3)

## 2016-07-04 MED ORDER — MORPHINE SULFATE ER 15 MG PO TBCR: 15.0000 mg | EXTENDED_RELEASE_TABLET | Freq: Two times a day (BID) | ORAL | 0 refills | Status: DC

## 2016-07-04 MED ORDER — SODIUM CHLORIDE 0.9 % IJ SOLN
10.0000 mL | INTRAMUSCULAR | Status: DC | PRN
  Administered 2016-07-04: 10 mL via INTRAVENOUS
  Filled 2016-07-04: qty 10

## 2016-07-04 NOTE — Progress Notes (Signed)
[place] Telephone:([phone]) [phone]   Fax:(336) [place], [place], [address] [place] [address]  DIAGNOSIS: Metastatic non-small cell lung cancer, squamous cell carcinoma diagnosed in March of 2013.   PRIOR THERAPY:  1. Status post palliative radiotherapy to the left lung mass under the care of Dr. Pablo Ledger completed on 03/06/2012.  2. Systemic chemotherapy with carboplatin for AUC of 6 on day 1 and Abraxane 100 mg/M2 on days 1, 8 and 15 every 3 weeks. Status post 2 cycles. From cycle 3 forward AUC will be decreased to 4.5 given on day 1 and the Abraxane will be decreased to 90 mg per meter squared on days 1, 8 and 15 every 3 weeks, Status post a total of 3 cycles.  3. Systemic chemotherapy with carboplatin for AUC of 5 on day 1 and gemcitabine 1000 mg/m2 given on day 1 and day 8 every 3 weeks,status post 1 cycle. Due to significant neutropenia she will be dosed reduced beginning cycle 2 forward to carboplatin at an AUC of 4 given on day 1 and gemcitabine at 800 mg per meter squared given on days 1 and 8 every 3 weeks. Status post 6 cycles.  4. Status post palliative radiotherapy to the right hilum under the care of Dr. Pablo Ledger completed on 11/26/2013. 5. Immunotherapy with Nivolumab 240 MG every 2 weeks. First dose expected 03/07/2015. Status post 20 cycles, last dose was given 01/18/2016 discontinued secondary to disease progression.  CURRENT THERAPY: Systemic chemotherapy with docetaxel 60 MG/M2 and Cyramza 10 mg/KG every 3 weeks. First dose 02/08/2016. Status post 7 cycles.  CHEMOTHERAPY INTENT: Palliative  CURRENT # OF CHEMOTHERAPY CYCLES: 7  CURRENT ANTIEMETICS: Compazine  CURRENT SMOKING STATUS: Former smoker  ORAL CHEMOTHERAPY AND CONSENT: None  CURRENT BISPHOSPHONATES USE: None  PAIN MANAGEMENT: 5/10 right shoulder currently on Norco  NARCOTICS INDUCED CONSTIPATION: None  LIVING WILL AND CODE  STATUS: No CODE BLUE   INTERVAL HISTORY: Destiny Harrison 75 y.o. female returns to the clinic today for followup visit accompanied by her husband. The patient is feeling fine today except for the persistent shortness of breath. She was admitted again to [facility] after her last chemotherapy and she was treated for pneumonia. She is recovering slowly from the recent admission. She denied having any significant chest pain but continues to have cough with no hemoptysis. She has no significant weight loss or night sweats. The patient denied having any significant fever or chills, nausea or vomiting. She was supposed to start cycle #8 of her chemotherapy today.  MEDICAL HISTORY: Past Medical History:  Diagnosis Date  . Asthma   . COPD (chronic obstructive pulmonary disease) ([place])   . DNR (do not resuscitate) 06/26/2015  . Dysphagia 12/07/2015  . Encounter for antineoplastic immunotherapy 05/22/2015  . Hiatal hernia   . History of chemotherapy   . History of radiation therapy 03/06/2012   left hilum  . History of radiation therapy 05/10/2013-05/31/2013   Left lung/ 33/75'@2'$ .25 per fraction x 15 fractions  . Hyperlipidemia   . Neutropenia, drug-induced ([place]) 05/05/2012  . Non-small cell lung cancer ([place]) dx'd 08/28/11   left lung  . Primary cancer of right upper lobe of lung ([place]) 02/06/2012  . Radiation 11/15/13-11/26/13   Right hilum 30 Gy in 10 fractions  . Rheumatoid arthritis(714.0)     ALLERGIES:  is allergic to shellfish allergy.  MEDICATIONS:  Current Outpatient Prescriptions  Medication Sig Dispense Refill  .  albuterol (PROAIR HFA) 108 (90 BASE) MCG/ACT inhaler Inhale 2 puffs into the lungs every 6 (six) hours as needed for wheezing or shortness of breath. 1 Inhaler 5  . amoxicillin-clavulanate (AUGMENTIN) 875-125 MG tablet Take 1 tablet by mouth every 12 (twelve) hours. 8 tablet 0  . Ascorbic Acid (VITAMIN C PO) Take 1 tablet by mouth daily.    . benzonatate (TESSALON) 100 MG capsule  Take 100 mg by mouth every 8 (eight) hours as needed for cough.   1  . budesonide (PULMICORT) 0.25 MG/2ML nebulizer solution Take 2 mLs (0.25 mg total) by nebulization 2 (two) times daily. 60 mL 12  . calcium-vitamin D (OSCAL WITH D) 500-200 MG-UNIT per tablet Take 1 tablet by mouth daily.    . Cholecalciferol (VITAMIN D PO) Take 1 tablet by mouth daily.    Marland Kitchen dexamethasone (DECADRON) 4 MG tablet Take 8 mg by mouth 2 (two) times daily. The day before, of, and the day after chemo    . diltiazem (DILACOR XR) 240 MG 24 hr capsule Take 1 capsule (240 mg total) by mouth every evening. 30 capsule 0  . famotidine (PEPCID) 20 MG tablet Take 1 tablet (20 mg total) by mouth 2 (two) times daily. 30 tablet 0  . folic acid (FOLVITE) 314 MCG tablet Take 400 mcg by mouth daily.    . furosemide (LASIX) 20 MG tablet Take 20 mg by mouth daily.   5  . guaiFENesin (MUCINEX) 600 MG 12 hr tablet Take 1 tablet (600 mg total) by mouth 2 (two) times daily. Please start in 3 days. 30 tablet 1  . HYDROcodone-acetaminophen (NORCO/VICODIN) 5-325 MG tablet Take 1 tablet by mouth every 4 (four) hours as needed for moderate pain. 60 tablet 0  . IRON PO Take 1 tablet by mouth daily.    Marland Kitchen lactose free nutrition (BOOST PLUS) LIQD Take 237 mLs by mouth 2 (two) times daily between meals. 14 Can 0  . magnesium oxide (MAG-OX) 400 (241.3 Mg) MG tablet Take 1 tablet (400 mg total) by mouth 2 (two) times daily. 60 tablet 0  . metoCLOPramide (REGLAN) 10 MG tablet Take 10 mg by mouth every 6 (six) hours as needed for nausea or vomiting.   0  . morphine (MS CONTIN) 15 MG 12 hr tablet Take 1 tablet (15 mg total) by mouth every 12 (twelve) hours. 60 tablet 0  . ondansetron (ZOFRAN-ODT) 8 MG disintegrating tablet DISSOLVE 1 TABLET BY MOUTH EVERY 8 HOURS AS NEEDED FOR NAUSEA AND VOMITING 20 tablet 0  . OXYGEN Inhale 3 L into the lungs continuous.     . pantoprazole (PROTONIX) 40 MG tablet Take 80 mg by mouth daily.     . polyethylene glycol  (MIRALAX / GLYCOLAX) packet MX AND DRK 1 PACKET PO QD MIXED WITH 8 OUNCES OF FLUID  0  . potassium chloride SA (K-DUR,KLOR-CON) 20 MEQ tablet Take 1 tablet (20 mEq total) by mouth daily. 7 tablet 0  . predniSONE (DELTASONE) 10 MG tablet Take 20 mg tomorrow, 10 mg the next day, 5 mg for 2 days and then stop 4 tablet 0  . simvastatin (ZOCOR) 10 MG tablet Take 10 mg by mouth daily.     No current facility-administered medications for this visit.     SURGICAL HISTORY:  Past Surgical History:  Procedure Laterality Date  . appendex  1962  . surgery on right wrist    . VIDEO BRONCHOSCOPY  01/28/2012   Procedure: VIDEO BRONCHOSCOPY WITHOUT FLUORO;  Surgeon: Brand Males, MD;  Location: [facility] ENDOSCOPY;  Service: Endoscopy;  Laterality: Bilateral;    REVIEW OF SYSTEMS:  A comprehensive review of systems was negative except for: Constitutional: positive for fatigue Respiratory: positive for cough, dyspnea on exertion and wheezing   PHYSICAL EXAMINATION: General appearance: alert, cooperative and no distress Head: Normocephalic, without obvious abnormality, atraumatic Neck: no adenopathy, no JVD, supple, symmetrical, trachea midline and thyroid not enlarged, symmetric, no tenderness/mass/nodules Lymph nodes: Cervical, supraclavicular, and axillary nodes normal. Resp: clear to auscultation bilaterally Back: symmetric, no curvature. ROM normal. No CVA tenderness. Cardio: regular rate and rhythm, S1, S2 normal, no murmur, click, rub or gallop GI: soft, non-tender; bowel sounds normal; no masses,  no organomegaly Extremities: extremities normal, atraumatic, no cyanosis or edema Neurologic: Alert and oriented X 3, normal strength and tone. Normal symmetric reflexes. Normal coordination and gait  ECOG PERFORMANCE STATUS: 1 - Symptomatic but completely ambulatory  Blood pressure (!) 117/45, pulse 99, temperature 98.7 F (37.1 C), temperature source Oral, resp. rate 16, height '5\' 5"'$  (1.651 m), weight  113 lb 3.2 oz (51.3 kg), SpO2 99 %.  LABORATORY DATA: Lab Results  Component Value Date   WBC 17.7 (H) 07/04/2016   HGB 8.4 (L) 07/04/2016   HCT 26.2 (L) 07/04/2016   MCV 82.9 07/04/2016   PLT 323 07/04/2016      Chemistry      Component Value Date/Time   NA 142 07/04/2016 0956   K 3.7 07/04/2016 0956   CL 103 06/26/2016 0630   CL 101 01/12/2013 0810   CO2 33 (H) 07/04/2016 0956   BUN 16.4 07/04/2016 0956   CREATININE 0.7 07/04/2016 0956      Component Value Date/Time   CALCIUM 8.6 07/04/2016 0956   ALKPHOS 95 07/04/2016 0956   AST 12 07/04/2016 0956   ALT 17 07/04/2016 0956   BILITOT 0.44 07/04/2016 0956       RADIOGRAPHIC STUDIES: Dg Chest 2 View  Result Date: 06/18/2016 CLINICAL DATA:  Shortness of breath, LEFT chest pain and fever this morning. History of COPD, lung cancer. EXAM: CHEST  2 VIEW COMPARISON:  COPD and RIGHT hilar lymphadenopathy. No acute cardiopulmonary process. FINDINGS: Cardiac silhouette is normal. Fullness of the RIGHT hila corresponding to known mediastinal lymph nodes. No pleural effusion or focal consolidation. Increased lung volumes consistent with history of COPD. Single lumen RIGHT chest Port-A-Cath with distal tip projecting in proximal superior vena cava. High-riding RIGHT humeral head most compatible with old rotator cuff injury. Broad levoscoliosis. IMPRESSION: COPD and RIGHT hilar mass/lymphadenopathy without acute cardiopulmonary process. Electronically Signed   By: Elon Alas M.D.   On: 06/18/2016 20:41   Ct Chest W Contrast  Result Date: 06/11/2016 CLINICAL DATA:  Followup lung cancer. EXAM: CT CHEST, ABDOMEN, AND PELVIS WITH CONTRAST TECHNIQUE: Multidetector CT imaging of the chest, abdomen and pelvis was performed following the standard protocol during bolus administration of intravenous contrast. CONTRAST:  179m ISOVUE-300 IOPAMIDOL (ISOVUE-300) INJECTION 61% COMPARISON:  04/16/2016 FINDINGS: CT CHEST FINDINGS Cardiovascular: The  heart size is normal. No pericardial effusion. There is aortic atherosclerosis. Calcification within the LAD, and left circumflex coronary artery noted. Mediastinum/Nodes: The trachea appears patent and is midline. Unremarkable appearance of the esophagus. Partially calcified anterior mediastinal, right paratracheal mass measures 3.4 x 3.4 cm, image 12 of series 5. This is compared with 3.9 x 3.7 cm previously. Sub- carinal lymph node measures 7 mm, image 24 of series 5. On the previous exam this measured 10 mm. Lungs/Pleura:  No pleural effusion. Advanced changes of centrilobular and paraseptal emphysema identified. Paramediastinal radiation change within the left lung is unchanged from previous exam. The perihilar right lung lesion measures 1.8 x 1.5 cm, image 30 of series 5. On the previous exam this measured 1.7 x 1.5 cm. Tiny nodule in the right lower lobe is unchanged measuring 2 mm, image 84 of series 4. Musculoskeletal: No aggressive lytic or sclerotic bone lesions identified. T4 compression deformity is stable from previous exam. CT ABDOMEN PELVIS FINDINGS Hepatobiliary: Stable hypervascular structure within the medial segment of left lobe of liver measuring 1 cm, image 59 of series 5. Stable 5 mm adjacent hypervascular lesion, image number 62 of series 5. No intrahepatic bile duct dilatation. The gallbladder appears normal. Pancreas: No inflammation or mass identified. Spleen: The spleen appears normal. Adrenals/Urinary Tract: The adrenal glands are normal. Unremarkable appearance of the right kidney. There is scarring involving the inferior pole of the right kidney. Urinary bladder is unremarkable. Stomach/Bowel: The stomach is normal. The small bowel loops have a normal course and caliber. The colon is negative. Vascular/Lymphatic: Calcified atherosclerotic disease involves the abdominal aorta. No aneurysm. No enlarged retroperitoneal or mesenteric adenopathy. No enlarged pelvic or inguinal lymph nodes.  Reproductive: The uterus and adnexal structures are unremarkable for patient's age. Other: There is no ascites or focal fluid collections within the abdomen or pelvis. Musculoskeletal: There is degenerative disc disease noted within the lumbar spine. 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 IMPRESSION: 1. No new or progressive metastatic disease. 2. Large anterior mediastinal right paratracheal lymph node is mildly decreased in size in the interval. There has been decrease in size of the sub- carinal lymph node. 3. Right perihilar soft tissue mass is stable to minimally increased in size in the interval. 4. Emphysema 5. Aortic atherosclerosis and coronary artery calcifications 6. Emphysema 7. Stable appearance of chronic T4 compression deformity and pagetoid disease involving the right femur. Electronically Signed   By: Kerby Moors M.D.   On: 06/11/2016 08:55   Ct Angio Chest Pe W/cm &/or Wo Cm  Result Date: 06/22/2016 CLINICAL DATA:  Worsening shortness of breath. Right lung carcinoma. COPD. EXAM: CT ANGIOGRAPHY CHEST WITH CONTRAST TECHNIQUE: Multidetector CT imaging of the chest was performed using the standard protocol during bolus administration of intravenous contrast. Multiplanar CT image reconstructions and MIPs were obtained to evaluate the vascular anatomy. CONTRAST:  100 mL Isovue 370 COMPARISON:  06/11/2016 FINDINGS: Cardiovascular: Satisfactory opacification of pulmonary arteries noted, and no pulmonary emboli identified. No evidence of thoracic aortic dissection or aneurysm. Normal heart size. Aortic atherosclerosis. Mediastinum/Lymph Nodes: Right paratracheal lymphadenopathy measures 3.4 x 3.4 cm on image 17/4 8 shows no significant change since previous study. Subcarinal lymph node measures 14 mm on image 35/4 compared with 7 mm previously. Lungs/Pleura: Mass in the central right perihilar region currently measures 2.0 x 1.8 cm on image 45/4 compared to 1.5 x 1.8 cm previously.  Right middle lobe atelectasis shows no significant change. Moderate emphysema is again demonstrated. Left lung paramediastinal radiation changes are again seen. New airspace disease is seen throughout the majority of the left lower lobe, suspicious for pneumonia. No evidence of pleural effusion. Upper abdomen: No acute findings. Musculoskeletal: No chest wall mass or suspicious bone lesions identified. Review of the MIP images confirms the above findings. IMPRESSION: No evidence of pulmonary embolism. New left lower lobe airspace disease, suspicious for pneumonia. Slight increase in size of central right perihilar lung mass, with stable peripheral right middle  lobe atelectasis. Mild increase in subcarinal mediastinal lymphadenopathy. Stable right paratracheal mediastinal lymphadenopathy. Emphysema. Electronically Signed   By: Earle Gell M.D.   On: 06/22/2016 19:26   Ct Abdomen Pelvis W Contrast  Result Date: 06/11/2016 CLINICAL DATA:  Followup lung cancer. EXAM: CT CHEST, ABDOMEN, AND PELVIS WITH CONTRAST TECHNIQUE: Multidetector CT imaging of the chest, abdomen and pelvis was performed following the standard protocol during bolus administration of intravenous contrast. CONTRAST:  124m ISOVUE-300 IOPAMIDOL (ISOVUE-300) INJECTION 61% COMPARISON:  04/16/2016 FINDINGS: CT CHEST FINDINGS Cardiovascular: The heart size is normal. No pericardial effusion. There is aortic atherosclerosis. Calcification within the LAD, and left circumflex coronary artery noted. Mediastinum/Nodes: The trachea appears patent and is midline. Unremarkable appearance of the esophagus. Partially calcified anterior mediastinal, right paratracheal mass measures 3.4 x 3.4 cm, image 12 of series 5. This is compared with 3.9 x 3.7 cm previously. Sub- carinal lymph node measures 7 mm, image 24 of series 5. On the previous exam this measured 10 mm. Lungs/Pleura: No pleural effusion. Advanced changes of centrilobular and paraseptal emphysema  identified. Paramediastinal radiation change within the left lung is unchanged from previous exam. The perihilar right lung lesion measures 1.8 x 1.5 cm, image 30 of series 5. On the previous exam this measured 1.7 x 1.5 cm. Tiny nodule in the right lower lobe is unchanged measuring 2 mm, image 84 of series 4. Musculoskeletal: No aggressive lytic or sclerotic bone lesions identified. T4 compression deformity is stable from previous exam. CT ABDOMEN PELVIS FINDINGS Hepatobiliary: Stable hypervascular structure within the medial segment of left lobe of liver measuring 1 cm, image 59 of series 5. Stable 5 mm adjacent hypervascular lesion, image number 62 of series 5. No intrahepatic bile duct dilatation. The gallbladder appears normal. Pancreas: No inflammation or mass identified. Spleen: The spleen appears normal. Adrenals/Urinary Tract: The adrenal glands are normal. Unremarkable appearance of the right kidney. There is scarring involving the inferior pole of the right kidney. Urinary bladder is unremarkable. Stomach/Bowel: The stomach is normal. The small bowel loops have a normal course and caliber. The colon is negative. Vascular/Lymphatic: Calcified atherosclerotic disease involves the abdominal aorta. No aneurysm. No enlarged retroperitoneal or mesenteric adenopathy. No enlarged pelvic or inguinal lymph nodes. Reproductive: The uterus and adnexal structures are unremarkable for patient's age. Other: There is no ascites or focal fluid collections within the abdomen or pelvis. Musculoskeletal: There is degenerative disc disease noted within the lumbar spine. 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 IMPRESSION: 1. No new or progressive metastatic disease. 2. Large anterior mediastinal right paratracheal lymph node is mildly decreased in size in the interval. There has been decrease in size of the sub- carinal lymph node. 3. Right perihilar soft tissue mass is stable to minimally increased in size in  the interval. 4. Emphysema 5. Aortic atherosclerosis and coronary artery calcifications 6. Emphysema 7. Stable appearance of chronic T4 compression deformity and pagetoid disease involving the right femur. Electronically Signed   By: TKerby MoorsM.D.   On: 06/11/2016 08:55   Dg Chest Portable 1 View  Result Date: 06/22/2016 CLINICAL DATA:  Acute shortness of breath today. History of left lung small cell carcinoma. EXAM: PORTABLE CHEST 1 VIEW COMPARISON:  06/18/2016 FINDINGS: Upper limits normal heart size noted. New left mid lung airspace disease is identified. Scarring/ posttreatment changes in the left perihilar region again noted. There is no evidence of pneumothorax or pleural effusion. No acute bony abnormalities are present. A right Port-A-Cath with tip overlying  the mid -lower SVC again noted. IMPRESSION: New left mid lung airspace opacity, likely pneumonia or aspiration. No other significant change. Electronically Signed   By: Margarette Canada M.D.   On: 06/22/2016 16:06   ASSESSMENT AND PLAN: This is a very pleasant 75 years old African American female with metastatic non-small cell lung cancer, squamous cell carcinoma status post several chemotherapy regimen and and completed a course of palliative radiotherapy to the right hilum in February 2015.  She completed a course of treatment with immunotherapy with Nivolumab status post 20 cycles. This was discontinued today secondary to disease progression with further enlargement of the large right paratracheal lymph node. I discussed the scan results and showed the images to the patient and her son. I recommended for her to discontinue her current treatment with Nivolumab at this point. She is currently on systemic chemotherapy with docetaxel 60 MG/M2 and Cyramza 10 MG/KG every 3 weeks with Neulasta support. Status post 7 cycles and tolerating her treatment well. Unfortunately she keep getting admitted to the hospital after every chemotherapy with the  Neulasta injection. She still very weak. I recommended for her to delay the start of cycle #8 by 1 week. For pain management, she will continue on MS Contin in addition to Vicodin. I gave her a refill of MS Contin today.  She would come back for follow-up visit in 4 weeks for evaluation before starting cycle #9. She was advised to call immediately if she has any concerning symptoms in the interval.  The patient voices understanding of current disease status and treatment options and is in agreement with the current care plan.  All questions were answered. The patient knows to call the clinic with any problems, questions or concerns. We can certainly see the patient much sooner if necessary.  Disclaimer: This note was dictated with voice recognition software. Similar sounding words can inadvertently be transcribed and may not be corrected upon review.

## 2016-07-05 ENCOUNTER — Encounter: Payer: Self-pay | Admitting: *Deleted

## 2016-07-05 ENCOUNTER — Telehealth: Payer: Self-pay

## 2016-07-05 DIAGNOSIS — R1310 Dysphagia, unspecified: Secondary | ICD-10-CM | POA: Diagnosis not present

## 2016-07-05 DIAGNOSIS — J690 Pneumonitis due to inhalation of food and vomit: Secondary | ICD-10-CM | POA: Diagnosis not present

## 2016-07-05 DIAGNOSIS — L89152 Pressure ulcer of sacral region, stage 2: Secondary | ICD-10-CM | POA: Diagnosis not present

## 2016-07-05 DIAGNOSIS — J441 Chronic obstructive pulmonary disease with (acute) exacerbation: Secondary | ICD-10-CM | POA: Diagnosis not present

## 2016-07-05 DIAGNOSIS — J440 Chronic obstructive pulmonary disease with acute lower respiratory infection: Secondary | ICD-10-CM | POA: Diagnosis not present

## 2016-07-05 DIAGNOSIS — C3411 Malignant neoplasm of upper lobe, right bronchus or lung: Secondary | ICD-10-CM | POA: Diagnosis not present

## 2016-07-05 NOTE — Telephone Encounter (Signed)
S/w husband to check they were aware not to come 9/16 for neulasta injection b/c chemo was cancelled on 9/14. Husband is aware.

## 2016-07-05 NOTE — Progress Notes (Signed)
Patient's spouse drove patient in from [facility] to [facility] stating port-a-cath uncomfortable and needs to be de-accessed.  This nurse assessed and flushed port-a-cath de-accessing per protocol in [place] infusion room area.  Denies any further needs at this time.

## 2016-07-06 ENCOUNTER — Ambulatory Visit: Payer: Medicare Other

## 2016-07-08 ENCOUNTER — Ambulatory Visit: Payer: Self-pay | Admitting: *Deleted

## 2016-07-09 DIAGNOSIS — L89152 Pressure ulcer of sacral region, stage 2: Secondary | ICD-10-CM | POA: Diagnosis not present

## 2016-07-09 DIAGNOSIS — J441 Chronic obstructive pulmonary disease with (acute) exacerbation: Secondary | ICD-10-CM | POA: Diagnosis not present

## 2016-07-09 DIAGNOSIS — R1310 Dysphagia, unspecified: Secondary | ICD-10-CM | POA: Diagnosis not present

## 2016-07-09 DIAGNOSIS — J690 Pneumonitis due to inhalation of food and vomit: Secondary | ICD-10-CM | POA: Diagnosis not present

## 2016-07-09 DIAGNOSIS — C3411 Malignant neoplasm of upper lobe, right bronchus or lung: Secondary | ICD-10-CM | POA: Diagnosis not present

## 2016-07-09 DIAGNOSIS — J440 Chronic obstructive pulmonary disease with acute lower respiratory infection: Secondary | ICD-10-CM | POA: Diagnosis not present

## 2016-07-11 ENCOUNTER — Telehealth: Payer: Self-pay | Admitting: Internal Medicine

## 2016-07-11 ENCOUNTER — Other Ambulatory Visit: Payer: Medicare Other

## 2016-07-11 DIAGNOSIS — C3490 Malignant neoplasm of unspecified part of unspecified bronchus or lung: Secondary | ICD-10-CM | POA: Diagnosis not present

## 2016-07-11 DIAGNOSIS — Z7951 Long term (current) use of inhaled steroids: Secondary | ICD-10-CM | POA: Diagnosis not present

## 2016-07-11 DIAGNOSIS — R000 Tachycardia, unspecified: Secondary | ICD-10-CM | POA: Diagnosis not present

## 2016-07-11 DIAGNOSIS — R05 Cough: Secondary | ICD-10-CM | POA: Diagnosis not present

## 2016-07-11 DIAGNOSIS — M069 Rheumatoid arthritis, unspecified: Secondary | ICD-10-CM | POA: Diagnosis not present

## 2016-07-11 DIAGNOSIS — Z9981 Dependence on supplemental oxygen: Secondary | ICD-10-CM | POA: Diagnosis not present

## 2016-07-11 DIAGNOSIS — J9621 Acute and chronic respiratory failure with hypoxia: Secondary | ICD-10-CM | POA: Diagnosis not present

## 2016-07-11 DIAGNOSIS — R002 Palpitations: Secondary | ICD-10-CM | POA: Diagnosis not present

## 2016-07-11 DIAGNOSIS — J440 Chronic obstructive pulmonary disease with acute lower respiratory infection: Secondary | ICD-10-CM | POA: Diagnosis present

## 2016-07-11 DIAGNOSIS — Z9221 Personal history of antineoplastic chemotherapy: Secondary | ICD-10-CM | POA: Diagnosis not present

## 2016-07-11 DIAGNOSIS — A419 Sepsis, unspecified organism: Secondary | ICD-10-CM | POA: Diagnosis not present

## 2016-07-11 DIAGNOSIS — J441 Chronic obstructive pulmonary disease with (acute) exacerbation: Secondary | ICD-10-CM | POA: Diagnosis not present

## 2016-07-11 DIAGNOSIS — Z780 Asymptomatic menopausal state: Secondary | ICD-10-CM | POA: Diagnosis not present

## 2016-07-11 DIAGNOSIS — Z87891 Personal history of nicotine dependence: Secondary | ICD-10-CM | POA: Diagnosis not present

## 2016-07-11 DIAGNOSIS — E876 Hypokalemia: Secondary | ICD-10-CM | POA: Diagnosis present

## 2016-07-11 DIAGNOSIS — J9622 Acute and chronic respiratory failure with hypercapnia: Secondary | ICD-10-CM | POA: Diagnosis not present

## 2016-07-11 DIAGNOSIS — L89313 Pressure ulcer of right buttock, stage 3: Secondary | ICD-10-CM | POA: Diagnosis not present

## 2016-07-11 DIAGNOSIS — C3411 Malignant neoplasm of upper lobe, right bronchus or lung: Secondary | ICD-10-CM | POA: Diagnosis present

## 2016-07-11 DIAGNOSIS — J189 Pneumonia, unspecified organism: Secondary | ICD-10-CM | POA: Diagnosis present

## 2016-07-11 DIAGNOSIS — Z79899 Other long term (current) drug therapy: Secondary | ICD-10-CM | POA: Diagnosis not present

## 2016-07-11 DIAGNOSIS — J9601 Acute respiratory failure with hypoxia: Secondary | ICD-10-CM | POA: Diagnosis not present

## 2016-07-11 DIAGNOSIS — D649 Anemia, unspecified: Secondary | ICD-10-CM | POA: Diagnosis not present

## 2016-07-11 DIAGNOSIS — C799 Secondary malignant neoplasm of unspecified site: Secondary | ICD-10-CM | POA: Diagnosis present

## 2016-07-11 DIAGNOSIS — N390 Urinary tract infection, site not specified: Secondary | ICD-10-CM | POA: Diagnosis not present

## 2016-07-11 DIAGNOSIS — L89153 Pressure ulcer of sacral region, stage 3: Secondary | ICD-10-CM | POA: Diagnosis present

## 2016-07-11 DIAGNOSIS — R0602 Shortness of breath: Secondary | ICD-10-CM | POA: Diagnosis not present

## 2016-07-11 DIAGNOSIS — Z66 Do not resuscitate: Secondary | ICD-10-CM | POA: Diagnosis present

## 2016-07-11 DIAGNOSIS — I493 Ventricular premature depolarization: Secondary | ICD-10-CM | POA: Diagnosis not present

## 2016-07-11 NOTE — Telephone Encounter (Signed)
Appointments complete per 9/14 los. Spoke with patient this morning re appointments 9/22 and patient to get new schedule when she comes in 9/22. Infusion area capped 9/21.   Treatments delayed from 9/14.

## 2016-07-12 ENCOUNTER — Ambulatory Visit: Payer: Medicare Other

## 2016-07-12 ENCOUNTER — Other Ambulatory Visit: Payer: Medicare Other

## 2016-07-13 ENCOUNTER — Ambulatory Visit: Payer: Medicare Other

## 2016-07-16 ENCOUNTER — Ambulatory Visit: Payer: Medicare Other

## 2016-07-17 ENCOUNTER — Telehealth: Payer: Self-pay | Admitting: *Deleted

## 2016-07-17 DIAGNOSIS — J440 Chronic obstructive pulmonary disease with acute lower respiratory infection: Secondary | ICD-10-CM | POA: Diagnosis not present

## 2016-07-17 DIAGNOSIS — J690 Pneumonitis due to inhalation of food and vomit: Secondary | ICD-10-CM | POA: Diagnosis not present

## 2016-07-17 DIAGNOSIS — R1310 Dysphagia, unspecified: Secondary | ICD-10-CM | POA: Diagnosis not present

## 2016-07-17 DIAGNOSIS — J441 Chronic obstructive pulmonary disease with (acute) exacerbation: Secondary | ICD-10-CM | POA: Diagnosis not present

## 2016-07-17 DIAGNOSIS — C3411 Malignant neoplasm of upper lobe, right bronchus or lung: Secondary | ICD-10-CM | POA: Diagnosis not present

## 2016-07-17 DIAGNOSIS — L89152 Pressure ulcer of sacral region, stage 2: Secondary | ICD-10-CM | POA: Diagnosis not present

## 2016-07-17 NOTE — Telephone Encounter (Signed)
Amy RN [facility] asking if pt needs to keep appt tomorrow 9/28 for lab/flush. Pt d/c form [facility] yesterday. Reviewed with MD- pt to keep all scheduled appts

## 2016-07-18 ENCOUNTER — Other Ambulatory Visit: Payer: Medicare Other

## 2016-07-21 ENCOUNTER — Encounter (HOSPITAL_COMMUNITY): Payer: Self-pay

## 2016-07-21 ENCOUNTER — Inpatient Hospital Stay (HOSPITAL_COMMUNITY)
Admission: EM | Admit: 2016-07-21 | Discharge: 2016-07-24 | DRG: 190 | Disposition: A | Discharge status: Home / Self Care | Payer: Medicare Other | Attending: Internal Medicine | Admitting: Internal Medicine

## 2016-07-21 ENCOUNTER — Emergency Department (HOSPITAL_COMMUNITY): Payer: Medicare Other

## 2016-07-21 DIAGNOSIS — M069 Rheumatoid arthritis, unspecified: Secondary | ICD-10-CM | POA: Diagnosis present

## 2016-07-21 DIAGNOSIS — D6481 Anemia due to antineoplastic chemotherapy: Secondary | ICD-10-CM | POA: Diagnosis not present

## 2016-07-21 DIAGNOSIS — Z9981 Dependence on supplemental oxygen: Secondary | ICD-10-CM

## 2016-07-21 DIAGNOSIS — Z803 Family history of malignant neoplasm of breast: Secondary | ICD-10-CM

## 2016-07-21 DIAGNOSIS — J9621 Acute and chronic respiratory failure with hypoxia: Secondary | ICD-10-CM | POA: Diagnosis not present

## 2016-07-21 DIAGNOSIS — Z87891 Personal history of nicotine dependence: Secondary | ICD-10-CM

## 2016-07-21 DIAGNOSIS — Z66 Do not resuscitate: Secondary | ICD-10-CM | POA: Diagnosis not present

## 2016-07-21 DIAGNOSIS — G8929 Other chronic pain: Secondary | ICD-10-CM | POA: Diagnosis present

## 2016-07-21 DIAGNOSIS — Z9221 Personal history of antineoplastic chemotherapy: Secondary | ICD-10-CM | POA: Diagnosis not present

## 2016-07-21 DIAGNOSIS — Z8701 Personal history of pneumonia (recurrent): Secondary | ICD-10-CM | POA: Diagnosis not present

## 2016-07-21 DIAGNOSIS — J440 Chronic obstructive pulmonary disease with acute lower respiratory infection: Secondary | ICD-10-CM | POA: Diagnosis present

## 2016-07-21 DIAGNOSIS — C3411 Malignant neoplasm of upper lobe, right bronchus or lung: Secondary | ICD-10-CM | POA: Diagnosis not present

## 2016-07-21 DIAGNOSIS — E43 Unspecified severe protein-calorie malnutrition: Secondary | ICD-10-CM | POA: Diagnosis present

## 2016-07-21 DIAGNOSIS — Z9889 Other specified postprocedural states: Secondary | ICD-10-CM | POA: Diagnosis not present

## 2016-07-21 DIAGNOSIS — J189 Pneumonia, unspecified organism: Secondary | ICD-10-CM | POA: Diagnosis not present

## 2016-07-21 DIAGNOSIS — Z681 Body mass index (BMI) 19 or less, adult: Secondary | ICD-10-CM

## 2016-07-21 DIAGNOSIS — Z923 Personal history of irradiation: Secondary | ICD-10-CM

## 2016-07-21 DIAGNOSIS — R000 Tachycardia, unspecified: Secondary | ICD-10-CM | POA: Diagnosis present

## 2016-07-21 DIAGNOSIS — Y95 Nosocomial condition: Secondary | ICD-10-CM | POA: Diagnosis present

## 2016-07-21 DIAGNOSIS — Z515 Encounter for palliative care: Secondary | ICD-10-CM | POA: Diagnosis not present

## 2016-07-21 DIAGNOSIS — Z79899 Other long term (current) drug therapy: Secondary | ICD-10-CM

## 2016-07-21 DIAGNOSIS — Z833 Family history of diabetes mellitus: Secondary | ICD-10-CM | POA: Diagnosis not present

## 2016-07-21 DIAGNOSIS — R0602 Shortness of breath: Secondary | ICD-10-CM | POA: Diagnosis not present

## 2016-07-21 DIAGNOSIS — J690 Pneumonitis due to inhalation of food and vomit: Secondary | ICD-10-CM | POA: Diagnosis not present

## 2016-07-21 DIAGNOSIS — L8992 Pressure ulcer of unspecified site, stage 2: Secondary | ICD-10-CM | POA: Diagnosis present

## 2016-07-21 DIAGNOSIS — J441 Chronic obstructive pulmonary disease with (acute) exacerbation: Secondary | ICD-10-CM | POA: Diagnosis not present

## 2016-07-21 DIAGNOSIS — J9620 Acute and chronic respiratory failure, unspecified whether with hypoxia or hypercapnia: Secondary | ICD-10-CM | POA: Diagnosis present

## 2016-07-21 DIAGNOSIS — J449 Chronic obstructive pulmonary disease, unspecified: Secondary | ICD-10-CM | POA: Diagnosis present

## 2016-07-21 DIAGNOSIS — K219 Gastro-esophageal reflux disease without esophagitis: Secondary | ICD-10-CM | POA: Diagnosis present

## 2016-07-21 DIAGNOSIS — R079 Chest pain, unspecified: Secondary | ICD-10-CM | POA: Diagnosis not present

## 2016-07-21 DIAGNOSIS — T451X5A Adverse effect of antineoplastic and immunosuppressive drugs, initial encounter: Secondary | ICD-10-CM | POA: Diagnosis present

## 2016-07-21 LAB — COMPREHENSIVE METABOLIC PANEL
ALBUMIN: 2.7 g/dL — AB (ref 3.5–5.0)
ALK PHOS: 84 U/L (ref 38–126)
ALT: 27 U/L (ref 14–54)
AST: 17 U/L (ref 15–41)
Anion gap: 5 (ref 5–15)
BUN: 18 mg/dL (ref 6–20)
CALCIUM: 8.8 mg/dL — AB (ref 8.9–10.3)
CHLORIDE: 97 mmol/L — AB (ref 101–111)
CO2: 36 mmol/L — AB (ref 22–32)
CREATININE: 0.61 mg/dL (ref 0.44–1.00)
GFR calc Af Amer: >60 mL/min (ref >60)
GFR calc non Af Amer: >60 mL/min (ref >60)
GLUCOSE: 127 mg/dL — AB (ref 65–99)
Potassium: 4.1 mmol/L (ref 3.5–5.1)
Sodium: 138 mmol/L (ref 135–145)
Total Bilirubin: 0.8 mg/dL (ref 0.3–1.2)
Total Protein: 5.9 g/dL — ABNORMAL LOW (ref 6.5–8.1)

## 2016-07-21 LAB — LACTIC ACID, PLASMA: Lactic Acid, Venous: 0.9 mmol/L (ref 0.5–1.9)

## 2016-07-21 LAB — CBC WITH DIFFERENTIAL/PLATELET
BASOS PCT: 0 %
Basophils Absolute: 0 10*3/uL (ref 0.0–0.1)
Eosinophils Absolute: 0.1 10*3/uL (ref 0.0–0.7)
Eosinophils Relative: 1 %
HEMATOCRIT: 25.3 % — AB (ref 36.0–46.0)
HEMOGLOBIN: 8 g/dL — AB (ref 12.0–15.0)
LYMPHS ABS: 0.8 10*3/uL (ref 0.7–4.0)
LYMPHS PCT: 7 %
MCH: 27.2 pg (ref 26.0–34.0)
MCHC: 31.6 g/dL (ref 30.0–36.0)
MCV: 86.1 fL (ref 78.0–100.0)
MONO ABS: 1 10*3/uL (ref 0.1–1.0)
MONOS PCT: 8 %
NEUTROS ABS: 9.8 10*3/uL — AB (ref 1.7–7.7)
NEUTROS PCT: 84 %
Platelets: 430 10*3/uL — ABNORMAL HIGH (ref 150–400)
RBC: 2.94 MIL/uL — ABNORMAL LOW (ref 3.87–5.11)
RDW: 21 % — AB (ref 11.5–15.5)
WBC: 11.7 10*3/uL — ABNORMAL HIGH (ref 4.0–10.5)

## 2016-07-21 LAB — BRAIN NATRIURETIC PEPTIDE: B Natriuretic Peptide: 84.3 pg/mL (ref 0.0–100.0)

## 2016-07-21 LAB — MRSA PCR SCREENING: MRSA by PCR: NEGATIVE

## 2016-07-21 LAB — TROPONIN I: Troponin I: <0.03 ng/mL (ref <0.03)

## 2016-07-21 MED ORDER — HEPARIN SODIUM (PORCINE) 5000 UNIT/ML IJ SOLN
5000.0000 [IU] | Freq: Three times a day (TID) | INTRAMUSCULAR | Status: DC
  Administered 2016-07-21 – 2016-07-24 (×8): 5000 [IU] via SUBCUTANEOUS
  Filled 2016-07-21 (×8): qty 1

## 2016-07-21 MED ORDER — BUDESONIDE 0.25 MG/2ML IN SUSP
0.2500 mg | Freq: Two times a day (BID) | RESPIRATORY_TRACT | Status: DC
  Administered 2016-07-21 – 2016-07-24 (×6): 0.25 mg via RESPIRATORY_TRACT
  Filled 2016-07-21 (×6): qty 2

## 2016-07-21 MED ORDER — VANCOMYCIN HCL IN DEXTROSE 1-5 GM/200ML-% IV SOLN
1000.0000 mg | Freq: Once | INTRAVENOUS | Status: AC
  Administered 2016-07-21: 1000 mg via INTRAVENOUS
  Filled 2016-07-21: qty 200

## 2016-07-21 MED ORDER — ALBUTEROL SULFATE (2.5 MG/3ML) 0.083% IN NEBU: 2.5000 mg | INHALATION_SOLUTION | RESPIRATORY_TRACT | Status: DC | PRN

## 2016-07-21 MED ORDER — POLYETHYLENE GLYCOL 3350 17 G PO PACK: 17.0000 g | PACK | Freq: Every day | ORAL | Status: DC | PRN

## 2016-07-21 MED ORDER — DEXTROSE 5 % IV SOLN
1.0000 g | Freq: Three times a day (TID) | INTRAVENOUS | Status: DC
  Filled 2016-07-21: qty 1

## 2016-07-21 MED ORDER — ALBUTEROL SULFATE (2.5 MG/3ML) 0.083% IN NEBU
5.0000 mg | INHALATION_SOLUTION | Freq: Once | RESPIRATORY_TRACT | Status: AC
  Administered 2016-07-21: 5 mg via RESPIRATORY_TRACT
  Filled 2016-07-21: qty 6

## 2016-07-21 MED ORDER — METHYLPREDNISOLONE SODIUM SUCC 125 MG IJ SOLR
80.0000 mg | Freq: Two times a day (BID) | INTRAMUSCULAR | Status: DC
  Administered 2016-07-21 – 2016-07-23 (×4): 80 mg via INTRAVENOUS
  Filled 2016-07-21 (×4): qty 2

## 2016-07-21 MED ORDER — ONDANSETRON HCL 4 MG/2ML IJ SOLN: 4.0000 mg | Freq: Four times a day (QID) | INTRAMUSCULAR | Status: DC | PRN

## 2016-07-21 MED ORDER — IOPAMIDOL (ISOVUE-370) INJECTION 76%
100.0000 mL | Freq: Once | INTRAVENOUS | Status: AC | PRN
  Administered 2016-07-21: 80 mL via INTRAVENOUS

## 2016-07-21 MED ORDER — PIPERACILLIN-TAZOBACTAM 3.375 G IVPB
3.3750 g | Freq: Once | INTRAVENOUS | Status: AC
  Administered 2016-07-21: 3.375 g via INTRAVENOUS
  Filled 2016-07-21: qty 50

## 2016-07-21 MED ORDER — GUAIFENESIN ER 600 MG PO TB12
600.0000 mg | ORAL_TABLET | Freq: Two times a day (BID) | ORAL | Status: DC
  Administered 2016-07-21 – 2016-07-24 (×6): 600 mg via ORAL
  Filled 2016-07-21 (×6): qty 1

## 2016-07-21 MED ORDER — ACETAMINOPHEN 325 MG PO TABS: 650.0000 mg | ORAL_TABLET | Freq: Four times a day (QID) | ORAL | Status: DC | PRN

## 2016-07-21 MED ORDER — IPRATROPIUM-ALBUTEROL 0.5-2.5 (3) MG/3ML IN SOLN
3.0000 mL | Freq: Four times a day (QID) | RESPIRATORY_TRACT | Status: DC
  Administered 2016-07-22 (×3): 3 mL via RESPIRATORY_TRACT
  Filled 2016-07-21 (×3): qty 3

## 2016-07-21 MED ORDER — IPRATROPIUM BROMIDE 0.02 % IN SOLN
0.5000 mg | Freq: Once | RESPIRATORY_TRACT | Status: AC
  Administered 2016-07-21: 0.5 mg via RESPIRATORY_TRACT
  Filled 2016-07-21: qty 2.5

## 2016-07-21 MED ORDER — IPRATROPIUM BROMIDE 0.02 % IN SOLN
0.5000 mg | Freq: Four times a day (QID) | RESPIRATORY_TRACT | Status: DC
  Administered 2016-07-21: 0.5 mg via RESPIRATORY_TRACT
  Filled 2016-07-21: qty 2.5

## 2016-07-21 MED ORDER — VANCOMYCIN HCL 500 MG IV SOLR
500.0000 mg | Freq: Two times a day (BID) | INTRAVENOUS | Status: DC
  Administered 2016-07-22 – 2016-07-23 (×3): 500 mg via INTRAVENOUS
  Filled 2016-07-21 (×3): qty 500

## 2016-07-21 MED ORDER — DEXTROSE 5 % IV SOLN
1.0000 g | Freq: Two times a day (BID) | INTRAVENOUS | Status: DC
  Administered 2016-07-21 – 2016-07-23 (×5): 1 g via INTRAVENOUS
  Filled 2016-07-21 (×6): qty 1

## 2016-07-21 MED ORDER — ACETAMINOPHEN 650 MG RE SUPP: 650.0000 mg | Freq: Four times a day (QID) | RECTAL | Status: DC | PRN

## 2016-07-21 MED ORDER — SODIUM CHLORIDE 0.9 % IV SOLN
INTRAVENOUS | Status: DC
Start: 2016-07-21 — End: 2016-07-24
  Administered 2016-07-21 – 2016-07-24 (×4): via INTRAVENOUS

## 2016-07-21 MED ORDER — DOCUSATE SODIUM 100 MG PO CAPS
100.0000 mg | ORAL_CAPSULE | Freq: Two times a day (BID) | ORAL | Status: DC
  Administered 2016-07-21 – 2016-07-24 (×4): 100 mg via ORAL
  Filled 2016-07-21 (×6): qty 1

## 2016-07-21 MED ORDER — ONDANSETRON HCL 4 MG PO TABS: 4.0000 mg | ORAL_TABLET | Freq: Four times a day (QID) | ORAL | Status: DC | PRN

## 2016-07-21 MED ORDER — MORPHINE SULFATE (PF) 2 MG/ML IV SOLN
1.0000 mg | INTRAVENOUS | Status: DC | PRN
  Administered 2016-07-22 – 2016-07-24 (×8): 1 mg via INTRAVENOUS
  Filled 2016-07-21 (×8): qty 1

## 2016-07-21 MED ORDER — ALBUTEROL SULFATE (2.5 MG/3ML) 0.083% IN NEBU
2.5000 mg | INHALATION_SOLUTION | Freq: Four times a day (QID) | RESPIRATORY_TRACT | Status: DC
  Administered 2016-07-21: 2.5 mg via RESPIRATORY_TRACT
  Filled 2016-07-21: qty 3

## 2016-07-21 NOTE — ED Provider Notes (Signed)
[facility] DEPT Provider Note   CSN: [id number] Arrival date & time: 07/21/16  1156     History   Chief Complaint Chief Complaint  Patient presents with  . CA Pt  . Shortness of Breath  . Cough  . Leg Swelling    HPI Destiny Harrison is a 75 y.o. female.  Patient has a history of stage IV lung cancer and COPD.  She is on 4 L home oxygen and presents to the emergency department 2 days of worsening shortness of breath and productive cough.  She was recently hospitalized at [facility] and treated for pneumonia.  She was discharged home on a course of Levaquin.  She was discharged on September 21.  She states initially she did well and now she is worsening again.  She reports some mild orthopnea and some new lower extremity edema.  No history DVT or pulmonary embolism.  She's tried her albuterol home without improvement in her symptoms.   The history is provided by the patient.  Cough     Past Medical History:  Diagnosis Date  . Asthma   . COPD (chronic obstructive pulmonary disease) ([place])   . DNR (do not resuscitate) 06/26/2015  . Dysphagia 12/07/2015  . Encounter for antineoplastic immunotherapy 05/22/2015  . Hiatal hernia   . History of chemotherapy   . History of radiation therapy 03/06/2012   left hilum  . History of radiation therapy 05/10/2013-05/31/2013   Left lung/ 33/75'@2'$ .25 per fraction x 15 fractions  . Hyperlipidemia   . Neutropenia, drug-induced ([place]) 05/05/2012  . Non-small cell lung cancer ([place]) dx'd 08/28/11   left lung  . Primary cancer of right upper lobe of lung ([place]) 02/06/2012  . Radiation 11/15/13-11/26/13   Right hilum 30 Gy in 10 fractions  . Rheumatoid arthritis(714.0)     Patient Active Problem List   Diagnosis Date Noted  . COPD, severe ([place]) 07/02/2016  . Physical deconditioning 07/02/2016  . Aspiration pneumonia of left lower lobe due to regurgitated food ([place]) 06/27/2016  . Acute on chronic respiratory failure ([place]) 06/27/2016  . Pressure  ulcer stage II 06/26/2016  . Sepsis secondary to UTI ([place]) 06/19/2016  . Pressure ulcer 06/19/2016  . Sepsis ([place]) 05/31/2016  . Immunocompromised state ([place]) 05/31/2016  . Protein-calorie malnutrition, severe 05/30/2016  . Chemotherapy-induced enteritis 05/29/2016  . Chronic hypoxemic respiratory failure ([place]) 05/29/2016  . Antineoplastic chemotherapy induced anemia 05/29/2016  . Nausea & vomiting 05/28/2016  . Hypokalemia 05/28/2016  . Port catheter in place 05/07/2016  . Dehydration 05/07/2016  . Encounter for antineoplastic chemotherapy 04/11/2016  . Chronic pain 12/21/2015  . Dysphagia 12/07/2015  . HCAP (healthcare-associated pneumonia) 08/18/2015  . Palliative care encounter   . DNR (do not resuscitate) 06/26/2015  . Sinus tachycardia ([place]) 06/26/2015  . Encounter for antineoplastic immunotherapy 05/22/2015  . COPD exacerbation ([place]) 04/22/2015  . Fatigue 02/28/2015  . UTI (lower urinary tract infection) 06/24/2014  . Chronic cough 02/06/2014  . Primary cancer of right upper lobe of lung ([place]) 02/06/2012  . COPD, moderate ([place]) 11/18/2011  . Dyspnea 11/04/2011    Past Surgical History:  Procedure Laterality Date  . appendex  1962  . surgery on right wrist    . VIDEO BRONCHOSCOPY  01/28/2012   Procedure: VIDEO BRONCHOSCOPY WITHOUT FLUORO;  Surgeon: Brand Males, MD;  Location: [facility] ENDOSCOPY;  Service: Endoscopy;  Laterality: Bilateral;    OB History    No data available       Home Medications  Prior to Admission medications   Medication Sig Start Date End Date Taking? Authorizing Provider  albuterol (PROAIR HFA) 108 (90 BASE) MCG/ACT inhaler Inhale 2 puffs into the lungs every 6 (six) hours as needed for wheezing or shortness of breath. 08/31/15   Brand Males, MD  amoxicillin-clavulanate (AUGMENTIN) 875-125 MG tablet Take 1 tablet by mouth every 12 (twelve) hours. 06/27/16   Debbe Odea, MD  Ascorbic Acid (VITAMIN C PO) Take 1 tablet by mouth daily.     Historical Provider, MD  benzonatate (TESSALON) 100 MG capsule Take 100 mg by mouth every 8 (eight) hours as needed for cough.  01/05/16   Historical Provider, MD  budesonide (PULMICORT) 0.25 MG/2ML nebulizer solution Take 2 mLs (0.25 mg total) by nebulization 2 (two) times daily. 08/20/15   Belkys A Regalado, MD  calcium-vitamin D (OSCAL WITH D) 500-200 MG-UNIT per tablet Take 1 tablet by mouth daily.    Historical Provider, MD  Cholecalciferol (VITAMIN D PO) Take 1 tablet by mouth daily.    Historical Provider, MD  dexamethasone (DECADRON) 4 MG tablet Take 8 mg by mouth 2 (two) times daily. The day before, of, and the day after chemo 04/09/16   Historical Provider, MD  diltiazem (DILACOR XR) 240 MG 24 hr capsule Take 1 capsule (240 mg total) by mouth every evening. 06/06/16   Lavina Hamman, MD  famotidine (PEPCID) 20 MG tablet Take 1 tablet (20 mg total) by mouth 2 (two) times daily. 06/06/16   Lavina Hamman, MD  folic acid (FOLVITE) 378 MCG tablet Take 400 mcg by mouth daily.    Historical Provider, MD  furosemide (LASIX) 20 MG tablet Take 20 mg by mouth daily.  04/30/16   Historical Provider, MD  guaiFENesin (MUCINEX) 600 MG 12 hr tablet Take 1 tablet (600 mg total) by mouth 2 (two) times daily. Please start in 3 days. 06/24/16   Silver Huguenin Elgergawy, MD  HYDROcodone-acetaminophen (NORCO/VICODIN) 5-325 MG tablet Take 1 tablet by mouth every 4 (four) hours as needed for moderate pain. 06/13/16   Curt Bears, MD  IRON PO Take 1 tablet by mouth daily.    Historical Provider, MD  lactose free nutrition (BOOST PLUS) LIQD Take 237 mLs by mouth 2 (two) times daily between meals. 06/06/16   Lavina Hamman, MD  magnesium oxide (MAG-OX) 400 (241.3 Mg) MG tablet Take 1 tablet (400 mg total) by mouth 2 (two) times daily. 02/18/16   Maryann Mikhail, DO  metoCLOPramide (REGLAN) 10 MG tablet Take 10 mg by mouth every 6 (six) hours as needed for nausea or vomiting.  03/29/16   Historical Provider, MD  morphine (MS  CONTIN) 15 MG 12 hr tablet Take 1 tablet (15 mg total) by mouth every 12 (twelve) hours. 07/04/16   Curt Bears, MD  ondansetron (ZOFRAN-ODT) 8 MG disintegrating tablet DISSOLVE 1 TABLET BY MOUTH EVERY 8 HOURS AS NEEDED FOR NAUSEA AND VOMITING 04/04/16   Curt Bears, MD  OXYGEN Inhale 3 L into the lungs continuous.     Historical Provider, MD  pantoprazole (PROTONIX) 40 MG tablet Take 80 mg by mouth daily.  05/28/16   Historical Provider, MD  polyethylene glycol (MIRALAX / GLYCOLAX) packet MX AND DRK 1 PACKET PO QD MIXED WITH 8 OUNCES OF FLUID 02/22/16   Historical Provider, MD  potassium chloride SA (K-DUR,KLOR-CON) 20 MEQ tablet Take 1 tablet (20 mEq total) by mouth daily. 05/16/16   Wyatt Portela, MD  predniSONE (DELTASONE) 10 MG tablet Take 20 mg  tomorrow, 10 mg the next day, 5 mg for 2 days and then stop 06/27/16   Debbe Odea, MD  simvastatin (ZOCOR) 10 MG tablet Take 10 mg by mouth daily.    Historical Provider, MD    Family History Family History  Problem Relation Age of Onset  . Diabetes Mother     insulin dependent  . Breast cancer Mother     Social History Social History  Substance Use Topics  . Smoking status: Former Smoker    Packs/day: 0.50    Years: 40.00    Types: Cigarettes    Quit date: 10/29/2009  . Smokeless tobacco: Never Used  . Alcohol use No     Allergies   Shellfish allergy   Review of Systems Review of Systems  All other systems reviewed and are negative.    Physical Exam Updated Vital Signs BP 122/58   Pulse 80   Temp 99.6 F (37.6 C) (Oral)   Resp 20   Ht '5\' 5"'$  (1.651 m)   Wt 113 lb (51.3 kg)   SpO2 98%   BMI 18.80 kg/m   Physical Exam  Constitutional: She is oriented to person, place, and time. She appears well-developed and well-nourished. No distress.  HENT:  Head: Normocephalic and atraumatic.  Eyes: EOM are normal.  Neck: Normal range of motion.  Cardiovascular: Regular rhythm and normal heart sounds.   Tachycardia    Pulmonary/Chest: Effort normal.  Rhonchi bilaterally  Abdominal: Soft. She exhibits no distension. There is no tenderness.  Musculoskeletal: Normal range of motion.  Neurological: She is alert and oriented to person, place, and time.  Skin: Skin is warm and dry.  Psychiatric: She has a normal mood and affect. Judgment normal.  Nursing note and vitals reviewed.    ED Treatments / Results  Labs (all labs ordered are listed, but only abnormal results are displayed) Labs Reviewed  CBC WITH DIFFERENTIAL/PLATELET - Abnormal; Notable for the following:       Result Value   WBC 11.7 (*)    RBC 2.94 (*)    Hemoglobin 8.0 (*)    HCT 25.3 (*)    RDW 21.0 (*)    Platelets 430 (*)    Neutro Abs 9.8 (*)    All other components within normal limits  COMPREHENSIVE METABOLIC PANEL - Abnormal; Notable for the following:    Chloride 97 (*)    CO2 36 (*)    Glucose, Bld 127 (*)    Calcium 8.8 (*)    Total Protein 5.9 (*)    Albumin 2.7 (*)    All other components within normal limits  TROPONIN I  BRAIN NATRIURETIC PEPTIDE    EKG  EKG Interpretation  Date/Time:  Sunday July 21 2016 12:11:08 EDT Ventricular Rate:  119 PR Interval:    QRS Duration: 79 QT Interval:  324 QTC Calculation: 456 R Axis:   84 Text Interpretation:  Sinus tachycardia Supraventricular bigeminy Borderline right axis deviation Probable LVH with secondary repol abnrm Minimal ST elevation, inferior leads No significant change was found Confirmed by Bernadett Milian  MD, Laurelyn Terrero ([id number]) on 07/21/2016 12:50:42 PM       Radiology Dg Chest 2 View  Result Date: 07/21/2016 CLINICAL DATA:  Pt c/o SOB and left side chest pain onset yesterday. H/o COPD, lung and bone cancer. Former smoker EXAM: CHEST  2 VIEW COMPARISON:  07/11/2016 FINDINGS: Opacity extending superiorly from the left hilum consistent with the patient's known lung carcinoma is without significant change. There is  a cystic area in the posterior aspect of the superior  segment of the right lower lobe, improved when compared to the CT chest dated 06/22/2016 Lungs show mild thickening of the bronchovascular and interstitial markings most evident in the lung bases. There is opacity along the oblique fissure within the right middle lobe, stable from the prior CT. There are no new areas of lung opacity. No pleural effusion or pneumothorax. Right anterior chest wall Port-A-Cath is stable. Bony thorax is demineralized but grossly intact. IMPRESSION: 1. No acute findings in the lungs. 2. Opacity in the left perihilar region and superior segment of the left lower lobe has improved when compared to the prior chest CT. It is similar to the most recent chest radiograph. No new lung abnormalities. Electronically Signed   By: Lajean Manes M.D.   On: 07/21/2016 12:34   Ct Angio Chest Pe W And/or Wo Contrast  Result Date: 07/21/2016 CLINICAL DATA:  75 year old female with shortness of breath and productive cough for the past 2 days. Bilateral lower extremity swelling and pain for the past 3-4 weeks. Recent history of pneumonia. Additional history of lung cancer. EXAM: CT ANGIOGRAPHY CHEST WITH CONTRAST TECHNIQUE: Multidetector CT imaging of the chest was performed using the standard protocol during bolus administration of intravenous contrast. Multiplanar CT image reconstructions and MIPs were obtained to evaluate the vascular anatomy. CONTRAST:  100 mL of Isovue 370. COMPARISON:  Chest CT 06/22/2016. FINDINGS: Cardiovascular: No filling defects within the pulmonary arterial tree to suggest underlying pulmonary embolism. Heart size is normal. There is no significant pericardial fluid, thickening or pericardial calcification. There is aortic atherosclerosis, as well as atherosclerosis of the great vessels of the mediastinum and the coronary arteries, including calcified atherosclerotic plaque in the left main, left anterior descending and left circumflex coronary arteries. Mediastinum/Nodes:  Large partially calcified nodal mass in the high right paratracheal nodal station is similar to prior studies measuring up to 3.9 x 3.1 cm on today's examination (image 23 of series 4). Mildly enlarged subcarinal lymph nodes are also similar to prior studies measuring up to 13 mm in short axis. No definite hilar lymphadenopathy. Esophagus is unremarkable in appearance. No axillary lymphadenopathy. Lungs/Pleura: Complete occlusion of the right middle lobe bronchus and chronic atelectasis of the entire right middle lobe is noted. In the perihilar aspect of the collapsed right middle lobe there is a 3.1 x 2.0 cm mass which is surrounded by atelectatic lung, best appreciated on image 50 of series 4, increased in size compared to the prior study 06/22/2016. Worsening cavitation in the posterior aspect of the upper left lung centered along the left major fissure involving portions of the posterior aspect of the left upper lobe and the superior aspect of the superior segment of the left lower lobe. This thick-walled cavity currently measures up to 7.1 x 4.5 x 4.4 cm (axial image 25 of series 11 and sagittal image 100 of series 6). This is contiguous with multiple bronchiectatic bronchi in the left upper lobe. In the adjacent portions of the superior segment of the left lower lobe there is some patchy peripheral airspace consolidation. Widespread ground-glass attenuation and septal thickening is also noted in portions of both lower lobes. Overall, compared to the prior examination from 06/22/2016, much of the previously noted airspace consolidation has resolved. Diffuse bronchial wall thickening with mild centrilobular emphysema. No pleural effusions. Upper Abdomen: Aortic atherosclerosis. Musculoskeletal: There are no aggressive appearing lytic or blastic lesions noted in the visualized portions of the skeleton.  Review of the MIP images confirms the above findings. IMPRESSION: 1. No evidence of pulmonary embolism. 2.  Interval enlargement of previously noted perihilar mass in the central aspect of the right middle lobe which currently measures 2.0 x 3.1 cm. 3. Previously noted subcarinal and high right paratracheal lymphadenopathy appears very similar to the prior examination, remain concerning for metastatic disease. 4. Although much of the airspace consolidation previously noted in the superior segment of the left lower lobe has largely resolved, there is increasing cavitation in the upper left lung on either side of the left major fissure, where there is a very large thick-walled cavity on today's study. This is likely infectious in etiology, however, given the thick wall, the possibility of cavitary neoplasm is not excluded. 5. Patchy multifocal the ill-defined airspace disease in the lungs bilaterally, most evident in the left lower lobe. The airspace consolidation in the left lower lobe has slightly improved compared to the prior examination, mild new patchy airspace consolidation in other regions (for instance, in the dependent right lower lobe) is indicative of new areas of infection, potentially the endobronchial spread. 6. Aortic atherosclerosis, in addition to left main and 2 vessel coronary artery disease. 7. Additional incidental findings, as above. Electronically Signed   By: Vinnie Langton M.D.   On: 07/21/2016 15:48    Procedures Procedures (including critical care time)  Medications Ordered in ED Medications  vancomycin (VANCOCIN) IVPB 1000 mg/200 mL premix (not administered)  piperacillin-tazobactam (ZOSYN) IVPB 3.375 g (not administered)  iopamidol (ISOVUE-370) 76 % injection 100 mL (80 mLs Intravenous Contrast Given 07/21/16 1509)     Initial Impression / Assessment and Plan / ED Course  I have reviewed the triage vital signs and the nursing notes.  Pertinent labs & imaging results that were available during my care of the patient were reviewed by me and considered in my medical decision  making (see chart for details).  Clinical Course  Patient with evidence of new and worsening pneumonia based on prior imaging.  CT angioedematous is also progression of her cancer.  Patient be started on a mycin and Zosyn at this time.  She is a DO NOT RESUSCITATE but otherwise would like antibiotic and standard care at this time.  She will be admitted the hospital.  Patient family updated    Final Clinical Impressions(s) / ED Diagnoses   Final diagnoses:  None    New Prescriptions New Prescriptions   No medications on file     Jola Schmidt, MD 07/21/16 [phone]

## 2016-07-21 NOTE — H&P (Signed)
TRH H&P   Patient Demographics:    Destiny Harrison, is a 75 y.o. female  MRN: [id number]   DOB - 12-19-1940  Admit Date - 07/21/2016  Outpatient Primary MD for the patient is Thressa Sheller, MD  Referring MD/NP/PA: Dr Venora Maples  Outpatient Specialists: Oncology Dr. Earlie Server  Patient coming from: Home  Chief Complaint  Patient presents with  . CA Pt  . Shortness of Breath  . Cough  . Leg Swelling      HPI:    Destiny Harrison  is a 75 y.o. female, with medical history significant formetastatic non-small cell lung cancer, COPD with chronic hypoxic respiratory failure, chronic pain, and GERD , with multiple recent hospitalization, recently discharged from [facility] on 9/21 for pneumonia, patient presents with complaints of 2 days of dyspnea, productive cough,, recently finished her prednisone taper and levofloxacin course, denies any chest pain, fever or chills at home, any nausea or vomiting, but family reports poor appetite and oral intake and progressive weakness. - In ED CTA chest negative for PE, but significant for left upper lung pneumonia, and worsening lung cancer, patient with mild leukocytosis, anemia with hemoglobin of 8, tachypneic, and tachycardic, to the on Zosyn and vancomycin, hospitalist requested to admit    Review of systems:    In addition to the HPI above, No Fever-chills, No Headache, No changes with Vision or hearing, No problems swallowing food or Liquids, No Chest pain, And planes of cough and shortness of breath No Abdominal pain, No Nausea or Vommitting, Bowel movements are regular, No Blood in stool or Urine, No dysuria, No new skin rashes or bruises, No new joints pains-aches,  No new weakness, tingling, numbness in any extremity, report generalized weakness No recent weight gain or loss, No polyuria, polydypsia or polyphagia, No significant  Mental Stressors.  A full 10 point Review of Systems was done, except as stated above, all other Review of Systems were negative.   With Past History of the following :    Past Medical History:  Diagnosis Date  . Asthma   . COPD (chronic obstructive pulmonary disease) ([place])   . DNR (do not resuscitate) 06/26/2015  . Dysphagia 12/07/2015  . Encounter for antineoplastic immunotherapy 05/22/2015  . Hiatal hernia   . History of chemotherapy   . History of radiation therapy 03/06/2012   left hilum  . History of radiation therapy 05/10/2013-05/31/2013   Left lung/ 33/75'@2'$ .25 per fraction x 15 fractions  . Hyperlipidemia   . Neutropenia, drug-induced ([place]) 05/05/2012  . Non-small cell lung cancer ([place]) dx'd 08/28/11   left lung  . Primary cancer of right upper lobe of lung ([place]) 02/06/2012  . Radiation 11/15/13-11/26/13   Right hilum 30 Gy in 10 fractions  . Rheumatoid arthritis(714.0)       Past Surgical History:  Procedure Laterality Date  . appendex  1962  . surgery on right  wrist    . VIDEO BRONCHOSCOPY  01/28/2012   Procedure: VIDEO BRONCHOSCOPY WITHOUT FLUORO;  Surgeon: Brand Males, MD;  Location: [facility];  Service: Endoscopy;  Laterality: Bilateral;      Social History:     Social History  Substance Use Topics  . Smoking status: Former Smoker    Packs/day: 0.50    Years: 40.00    Types: Cigarettes    Quit date: 10/29/2009  . Smokeless tobacco: Never Used  . Alcohol use No     Lives - At Home  Mobility - with assistance     Family History :     Family History  Problem Relation Age of Onset  . Diabetes Mother     insulin dependent  . Breast cancer Mother       Home Medications:   Prior to Admission medications   Medication Sig Start Date End Date Taking? Authorizing Provider  albuterol (PROAIR HFA) 108 (90 BASE) MCG/ACT inhaler Inhale 2 puffs into the lungs every 6 (six) hours as needed for wheezing or shortness of breath. 08/31/15  Yes Brand Males, MD  albuterol (PROVENTIL) (2.5 MG/3ML) 0.083% nebulizer solution Take 2.5 mg by nebulization 3 (three) times daily.   Yes Historical Provider, MD  Ascorbic Acid (VITAMIN C PO) Take 1 tablet by mouth daily.   Yes Historical Provider, MD  benzonatate (TESSALON) 100 MG capsule Take 100 mg by mouth every 8 (eight) hours as needed for cough.  01/05/16  Yes Historical Provider, MD  budesonide (PULMICORT) 0.25 MG/2ML nebulizer solution Take 2 mLs (0.25 mg total) by nebulization 2 (two) times daily. 08/20/15  Yes Belkys A Regalado, MD  calcium-vitamin D (OSCAL WITH D) 500-200 MG-UNIT per tablet Take 1 tablet by mouth daily.   Yes Historical Provider, MD  Cholecalciferol (VITAMIN D PO) Take 1 tablet by mouth daily.   Yes Historical Provider, MD  dexamethasone (DECADRON) 4 MG tablet Take 8 mg by mouth as directed. Take '8mg'$  by mouth twice daily- The day before, of, and the day after chemo 04/09/16  Yes Historical Provider, MD  diltiazem (DILACOR XR) 180 MG 24 hr capsule Take 180 mg by mouth daily.   Yes Historical Provider, MD  famotidine (PEPCID) 20 MG tablet Take 1 tablet (20 mg total) by mouth 2 (two) times daily. 06/06/16  Yes Lavina Hamman, MD  folic acid (FOLVITE) 211 MCG tablet Take 400 mcg by mouth daily.   Yes Historical Provider, MD  guaiFENesin (MUCINEX) 600 MG 12 hr tablet Take 1 tablet (600 mg total) by mouth 2 (two) times daily. Please start in 3 days. Patient taking differently: Take 1,200 mg by mouth 2 (two) times daily. Please start in 3 days. 06/24/16  Yes Albertine Patricia, MD  HYDROcodone-acetaminophen (NORCO/VICODIN) 5-325 MG tablet Take 1 tablet by mouth every 4 (four) hours as needed for moderate pain. 06/13/16  Yes Curt Bears, MD  ipratropium (ATROVENT) 0.02 % nebulizer solution Take 0.5 mg by nebulization 3 (three) times daily.   Yes Historical Provider, MD  IRON PO Take 1 tablet by mouth daily.   Yes Historical Provider, MD  lactose free nutrition (BOOST PLUS) LIQD Take 237  mLs by mouth 2 (two) times daily between meals. 06/06/16  Yes Lavina Hamman, MD  levofloxacin (LEVAQUIN) 500 MG tablet Take 500 mg by mouth daily.   Yes Historical Provider, MD  magnesium oxide (MAG-OX) 400 (241.3 Mg) MG tablet Take 1 tablet (400 mg total) by mouth 2 (two) times daily.  02/18/16  Yes Maryann Mikhail, DO  metoCLOPramide (REGLAN) 10 MG tablet Take 10 mg by mouth every 6 (six) hours as needed for nausea or vomiting.  03/29/16  Yes Historical Provider, MD  morphine (MSIR) 15 MG tablet Take 15 mg by mouth every 4 (four) hours as needed for severe pain.   Yes Historical Provider, MD  ondansetron (ZOFRAN-ODT) 8 MG disintegrating tablet DISSOLVE 1 TABLET BY MOUTH EVERY 8 HOURS AS NEEDED FOR NAUSEA AND VOMITING 04/04/16  Yes Curt Bears, MD  OXYGEN Inhale 3 L into the lungs continuous.    Yes Historical Provider, MD  pantoprazole (PROTONIX) 40 MG tablet Take 80 mg by mouth daily.  05/28/16  Yes Historical Provider, MD  polyethylene glycol (MIRALAX / GLYCOLAX) packet MX AND DRK 1 PACKET PO QD MIXED WITH 8 OUNCES OF FLUID 02/22/16  Yes Historical Provider, MD  potassium chloride SA (K-DUR,KLOR-CON) 20 MEQ tablet Take 1 tablet (20 mEq total) by mouth daily. 05/16/16  Yes Wyatt Portela, MD  simvastatin (ZOCOR) 10 MG tablet Take 10 mg by mouth daily.   Yes Historical Provider, MD  amoxicillin-clavulanate (AUGMENTIN) 875-125 MG tablet Take 1 tablet by mouth every 12 (twelve) hours. Patient not taking: Reported on 07/21/2016 06/27/16   Debbe Odea, MD  diltiazem (DILACOR XR) 240 MG 24 hr capsule Take 1 capsule (240 mg total) by mouth every evening. Patient not taking: Reported on 07/21/2016 06/06/16   Lavina Hamman, MD  furosemide (LASIX) 20 MG tablet Take 20 mg by mouth daily.  04/30/16   Historical Provider, MD  morphine (MS CONTIN) 15 MG 12 hr tablet Take 1 tablet (15 mg total) by mouth every 12 (twelve) hours. Patient not taking: Reported on 07/21/2016 07/04/16   Curt Bears, MD  predniSONE  (DELTASONE) 10 MG tablet Take 20 mg tomorrow, 10 mg the next day, 5 mg for 2 days and then stop Patient not taking: Reported on 07/21/2016 06/27/16   Debbe Odea, MD  predniSONE (DELTASONE) 20 MG tablet Take 40 mg by mouth daily with breakfast.    Historical Provider, MD     Allergies:     Allergies  Allergen Reactions  . Shellfish Allergy Swelling     Physical Exam:   Vitals  Blood pressure 123/61, pulse 119, temperature 99.6 F (37.6 C), temperature source Oral, resp. rate 25, height '5\' 5"'$  (1.651 m), weight 51.3 kg (113 lb), SpO2 97 %.   1. General Frail, thin-appearing elderly female, in discomfort secondary to respiratory distress   2.Awake alert, but confused  3. No F.N deficits, ALL C.Nerves Intact, Patient with generalized weakness, but nonfocal   4. Ears and Eyes appear Normal, Conjunctivae clear, PERRLA. Dry Oral Mucosa.  5. Supple Neck, No JVD,  No Carotid Bruits.  6. Symmetrical Chest wall movement, Diminished air movement bilaterally, no wheezing, use of accessory muscle.  7. Tachycardic, No Gallops, Rubs or Murmurs, No Parasternal Heave, +2edema.  8. Positive Bowel Sounds, Abdomen Soft, No tenderness, No organomegaly appriciated,No rebound -guarding or rigidity.  9.  No Cyanosis, Normal Skin Turgor, No Skin Rash or Bruise.  10. The thickened muscle wasting and loss,  joints appear normal , no effusions, Normal ROM.    Data Review:    CBC  Recent Labs Lab 07/21/16 1305  WBC 11.7*  HGB 8.0*  HCT 25.3*  PLT 430*  MCV 86.1  MCH 27.2  MCHC 31.6  RDW 21.0*  LYMPHSABS 0.8  MONOABS 1.0  EOSABS 0.1  BASOSABS 0.0   ------------------------------------------------------------------------------------------------------------------  Chemistries   Recent Labs Lab 07/21/16 1305  NA 138  K 4.1  CL 97*  CO2 36*  GLUCOSE 127*  BUN 18  CREATININE 0.61  CALCIUM 8.8*  AST 17  ALT 27  ALKPHOS 84  BILITOT 0.8    ------------------------------------------------------------------------------------------------------------------ estimated creatinine clearance is 48.5 mL/min (by C-G formula based on SCr of 0.61 mg/dL). ------------------------------------------------------------------------------------------------------------------ No results for input(s): TSH, T4TOTAL, T3FREE, THYROIDAB in the last 72 hours.  Invalid input(s): FREET3  Coagulation profile No results for input(s): INR, PROTIME in the last 168 hours. ------------------------------------------------------------------------------------------------------------------- No results for input(s): DDIMER in the last 72 hours. -------------------------------------------------------------------------------------------------------------------  Cardiac Enzymes  Recent Labs Lab 07/21/16 1305  TROPONINI <0.03   ------------------------------------------------------------------------------------------------------------------    Component Value Date/Time   BNP 84.3 07/21/2016 1305     ---------------------------------------------------------------------------------------------------------------  Urinalysis    Component Value Date/Time   COLORURINE YELLOW 06/22/2016 2302   APPEARANCEUR CLEAR 06/22/2016 2302   LABSPEC 1.014 06/22/2016 2302   PHURINE 7.5 06/22/2016 2302   GLUCOSEU NEGATIVE 06/22/2016 2302   HGBUR NEGATIVE 06/22/2016 2302   BILIRUBINUR NEGATIVE 06/22/2016 2302   KETONESUR NEGATIVE 06/22/2016 2302   PROTEINUR NEGATIVE 06/22/2016 2302   UROBILINOGEN 0.2 08/18/2015 0730   NITRITE NEGATIVE 06/22/2016 2302   LEUKOCYTESUR NEGATIVE 06/22/2016 2302    ----------------------------------------------------------------------------------------------------------------   Imaging Results:    Dg Chest 2 View  Result Date: 07/21/2016 CLINICAL DATA:  Pt c/o SOB and left side chest pain onset yesterday. H/o COPD, lung and bone  cancer. Former smoker EXAM: CHEST  2 VIEW COMPARISON:  07/11/2016 FINDINGS: Opacity extending superiorly from the left hilum consistent with the patient's known lung carcinoma is without significant change. There is a cystic area in the posterior aspect of the superior segment of the right lower lobe, improved when compared to the CT chest dated 06/22/2016 Lungs show mild thickening of the bronchovascular and interstitial markings most evident in the lung bases. There is opacity along the oblique fissure within the right middle lobe, stable from the prior CT. There are no new areas of lung opacity. No pleural effusion or pneumothorax. Right anterior chest wall Port-A-Cath is stable. Bony thorax is demineralized but grossly intact. IMPRESSION: 1. No acute findings in the lungs. 2. Opacity in the left perihilar region and superior segment of the left lower lobe has improved when compared to the prior chest CT. It is similar to the most recent chest radiograph. No new lung abnormalities. Electronically Signed   By: Lajean Manes M.D.   On: 07/21/2016 12:34   Ct Angio Chest Pe W And/or Wo Contrast  Result Date: 07/21/2016 CLINICAL DATA:  75 year old female with shortness of breath and productive cough for the past 2 days. Bilateral lower extremity swelling and pain for the past 3-4 weeks. Recent history of pneumonia. Additional history of lung cancer. EXAM: CT ANGIOGRAPHY CHEST WITH CONTRAST TECHNIQUE: Multidetector CT imaging of the chest was performed using the standard protocol during bolus administration of intravenous contrast. Multiplanar CT image reconstructions and MIPs were obtained to evaluate the vascular anatomy. CONTRAST:  100 mL of Isovue 370. COMPARISON:  Chest CT 06/22/2016. FINDINGS: Cardiovascular: No filling defects within the pulmonary arterial tree to suggest underlying pulmonary embolism. Heart size is normal. There is no significant pericardial fluid, thickening or pericardial calcification.  There is aortic atherosclerosis, as well as atherosclerosis of the great vessels of the mediastinum and the coronary arteries, including calcified atherosclerotic plaque in the left main, left anterior descending and left circumflex coronary arteries. Mediastinum/Nodes:  Large partially calcified nodal mass in the high right paratracheal nodal station is similar to prior studies measuring up to 3.9 x 3.1 cm on today's examination (image 23 of series 4). Mildly enlarged subcarinal lymph nodes are also similar to prior studies measuring up to 13 mm in short axis. No definite hilar lymphadenopathy. Esophagus is unremarkable in appearance. No axillary lymphadenopathy. Lungs/Pleura: Complete occlusion of the right middle lobe bronchus and chronic atelectasis of the entire right middle lobe is noted. In the perihilar aspect of the collapsed right middle lobe there is a 3.1 x 2.0 cm mass which is surrounded by atelectatic lung, best appreciated on image 50 of series 4, increased in size compared to the prior study 06/22/2016. Worsening cavitation in the posterior aspect of the upper left lung centered along the left major fissure involving portions of the posterior aspect of the left upper lobe and the superior aspect of the superior segment of the left lower lobe. This thick-walled cavity currently measures up to 7.1 x 4.5 x 4.4 cm (axial image 25 of series 11 and sagittal image 100 of series 6). This is contiguous with multiple bronchiectatic bronchi in the left upper lobe. In the adjacent portions of the superior segment of the left lower lobe there is some patchy peripheral airspace consolidation. Widespread ground-glass attenuation and septal thickening is also noted in portions of both lower lobes. Overall, compared to the prior examination from 06/22/2016, much of the previously noted airspace consolidation has resolved. Diffuse bronchial wall thickening with mild centrilobular emphysema. No pleural effusions. Upper  Abdomen: Aortic atherosclerosis. Musculoskeletal: There are no aggressive appearing lytic or blastic lesions noted in the visualized portions of the skeleton. Review of the MIP images confirms the above findings. IMPRESSION: 1. No evidence of pulmonary embolism. 2. Interval enlargement of previously noted perihilar mass in the central aspect of the right middle lobe which currently measures 2.0 x 3.1 cm. 3. Previously noted subcarinal and high right paratracheal lymphadenopathy appears very similar to the prior examination, remain concerning for metastatic disease. 4. Although much of the airspace consolidation previously noted in the superior segment of the left lower lobe has largely resolved, there is increasing cavitation in the upper left lung on either side of the left major fissure, where there is a very large thick-walled cavity on today's study. This is likely infectious in etiology, however, given the thick wall, the possibility of cavitary neoplasm is not excluded. 5. Patchy multifocal the ill-defined airspace disease in the lungs bilaterally, most evident in the left lower lobe. The airspace consolidation in the left lower lobe has slightly improved compared to the prior examination, mild new patchy airspace consolidation in other regions (for instance, in the dependent right lower lobe) is indicative of new areas of infection, potentially the endobronchial spread. 6. Aortic atherosclerosis, in addition to left main and 2 vessel coronary artery disease. 7. Additional incidental findings, as above. Electronically Signed   By: Vinnie Langton M.D.   On: 07/21/2016 15:48    My personal review of EKG: Rhythm sinus tachycardia, Rate  119 /min, QTc 456    Assessment & Plan:    Active Problems:   COPD, moderate ([place])   DNR (do not resuscitate)   Sinus tachycardia ([place])   HCAP (healthcare-associated pneumonia)   Antineoplastic chemotherapy induced anemia   Protein-calorie malnutrition, severe    Acute on chronic respiratory failure ([place])    HCAP -Patient with multiple recent hospitalization, most recently discharged on 9/21 from [facility]  for pneumonia. - CT chest showing improvement of left lower pneumonia, but you left upper lobe pneumonia, pneumonia pathway utilized on admission, follow blood cultures, sputum cultures, Legionella and strep pneumonia antigen - Continue with IV vancomycin and cefepime  Acute on chronic respiratory failure - Patient with chronic hypoxic respiratory failure, on 4 L nasal cannula at baseline, currently appears to be with increased risk of breathing, will keep on BiPAP when necessary, and admitte to step down, confirmed DO NOT RESUSCITATE/DO NOT INTUBATE CODE STATUS.  Metastatic lung cancer - Patient's followed by Dr. Earlie Server - Patient appears to be critically ill, discussed with husband and daughter, phone the next 24 hours or very critical.  Anemia - This is secondary to chemotherapy and chronic illness, hemoglobin of 8, transfuse as needed  Sinus tachycardia - Secondary to acute illness and respiratory distress, and holding her Cardizem ,CT chest negative for PE - Continue with gentle hydration.  COPD - No active wheezing, but patient has been intermittently on prednisone, just stopped last dose yesterday, will start on IV insulin to 80 every 12 hours, continue with DuoNeb's, and budesonide  Severe protein calorie malnutrition - We'll resume on supplement 1 start taking oral  Chronic pain - Hold by mouth meds, will keep on when necessary morphine  Will hold patient's oral home medication, will keep nothing by mouth as she appears altered, they can started when her mentation improves.  DVT Prophylaxis Heparin -   SCDs  AM Labs Ordered, also please review Full Orders  Family Communication: Admission, patients condition and plan of care including tests being ordered have been discussed with the patient and husband anddaughter  who indicate understanding and agree with the plan and Code Status.  Code Status DNR  Likely DC to  Pending further work up.  Condition GUARDED/critical  Consults called: none  Admission status: inpatient  Time spent in minutes : 65 minutes   Mariabella Nilsen M.D on 07/21/2016 at 5:42 PM  Between 7am to 7pm - Pager - [phone]. After 7pm go to [email or web address] - password [facility]  [facility] - Office  [phone]

## 2016-07-21 NOTE — ED Notes (Signed)
Pharmacy paged for breathing treatment.

## 2016-07-21 NOTE — ED Triage Notes (Signed)
Pt c/o SOB and productive cough x 2 days and BLE swelling and pain x 3-4 weeks.  Pain score 10/10.  Pt was recently admitted at [facility] for PNA.  Hx of lung CA and COPD.  Last chemo 9/13.

## 2016-07-21 NOTE — ED Notes (Signed)
MD at bedside. 

## 2016-07-21 NOTE — ED Notes (Signed)
Patient transported to X-ray 

## 2016-07-21 NOTE — Progress Notes (Signed)
Pharmacy Antibiotic Note  Destiny Harrison is a 75 y.o. female admitted on 07/21/2016 with pneumonia.  Pharmacy has been consulted for Vancomycin dosing and antibiotic renal dose adjustment.  Initial Vancomycin and Zosyn doses given in ED.  Plan:  Cefepime 1g IV q12h  Vancomycin 1g x 1 dose in ED, then 500 mg IV q12h.  Measure Vanc trough at steady state.  Follow up renal fxn, culture results, and clinical course.   Height: '5\' 5"'$  (165.1 cm) Weight: 113 lb (51.3 kg) IBW/kg (Calculated) : 57  Temp (24hrs), Avg:99.7 F (37.6 C), Min:99.6 F (37.6 C), Max:99.8 F (37.7 C)   Recent Labs Lab 07/21/16 1305  WBC 11.7*  CREATININE 0.61    Estimated Creatinine Clearance: 48.5 mL/min (by C-G formula based on SCr of 0.61 mg/dL).    Allergies  Allergen Reactions  . Shellfish Allergy Swelling    Antimicrobials this admission: 10/1 Zosyn x1 10/1 Vancomycin >>  10/1 Cefepime >>   Dose adjustments this admission:   Microbiology results: 10/1 BCx:  10/1 Sputum:   10/1 MRSA PCR:   Thank you for allowing pharmacy to be a part of this patient's care.  Gretta Arab PharmD, BCPS Pager [phone] 07/21/2016 6:43 PM

## 2016-07-21 NOTE — ED Notes (Signed)
Patient transported to CT 

## 2016-07-22 LAB — BASIC METABOLIC PANEL
ANION GAP: 8 (ref 5–15)
BUN: 18 mg/dL (ref 6–20)
CALCIUM: 8.6 mg/dL — AB (ref 8.9–10.3)
CO2: 34 mmol/L — AB (ref 22–32)
Chloride: 94 mmol/L — ABNORMAL LOW (ref 101–111)
Creatinine, Ser: 0.56 mg/dL (ref 0.44–1.00)
Glucose, Bld: 146 mg/dL — ABNORMAL HIGH (ref 65–99)
Potassium: 4.4 mmol/L (ref 3.5–5.1)
SODIUM: 136 mmol/L (ref 135–145)

## 2016-07-22 LAB — CBC
HCT: 25.2 % — ABNORMAL LOW (ref 36.0–46.0)
HEMOGLOBIN: 8.1 g/dL — AB (ref 12.0–15.0)
MCH: 26.5 pg (ref 26.0–34.0)
MCHC: 32.1 g/dL (ref 30.0–36.0)
MCV: 82.4 fL (ref 78.0–100.0)
Platelets: 423 10*3/uL — ABNORMAL HIGH (ref 150–400)
RBC: 3.06 MIL/uL — AB (ref 3.87–5.11)
RDW: 20.7 % — ABNORMAL HIGH (ref 11.5–15.5)
WBC: 8.1 10*3/uL (ref 4.0–10.5)

## 2016-07-22 LAB — STREP PNEUMONIAE URINARY ANTIGEN: STREP PNEUMO URINARY ANTIGEN: NEGATIVE

## 2016-07-22 LAB — EXPECTORATED SPUTUM ASSESSMENT W REFEX TO RESP CULTURE

## 2016-07-22 MED ORDER — IPRATROPIUM BROMIDE 0.02 % IN SOLN
0.5000 mg | Freq: Three times a day (TID) | RESPIRATORY_TRACT | Status: DC
  Administered 2016-07-22 – 2016-07-24 (×5): 0.5 mg via RESPIRATORY_TRACT
  Filled 2016-07-22 (×5): qty 2.5

## 2016-07-22 MED ORDER — PANTOPRAZOLE SODIUM 40 MG PO TBEC
80.0000 mg | DELAYED_RELEASE_TABLET | Freq: Every day | ORAL | Status: DC
  Administered 2016-07-22 – 2016-07-24 (×3): 80 mg via ORAL
  Filled 2016-07-22 (×3): qty 2

## 2016-07-22 MED ORDER — LEVALBUTEROL HCL 1.25 MG/0.5ML IN NEBU
1.2500 mg | INHALATION_SOLUTION | Freq: Three times a day (TID) | RESPIRATORY_TRACT | Status: DC
Start: 2016-07-22 — End: 2016-07-24
  Administered 2016-07-22 – 2016-07-24 (×5): 1.25 mg via RESPIRATORY_TRACT
  Filled 2016-07-22 (×7): qty 0.5

## 2016-07-22 MED ORDER — BENZONATATE 100 MG PO CAPS
100.0000 mg | ORAL_CAPSULE | Freq: Three times a day (TID) | ORAL | Status: DC | PRN
  Administered 2016-07-23: 100 mg via ORAL
  Filled 2016-07-22: qty 1

## 2016-07-22 MED ORDER — DILTIAZEM HCL ER COATED BEADS 180 MG PO CP24
180.0000 mg | ORAL_CAPSULE | Freq: Every day | ORAL | Status: DC
  Administered 2016-07-22 – 2016-07-24 (×3): 180 mg via ORAL
  Filled 2016-07-22 (×6): qty 1

## 2016-07-22 MED ORDER — SODIUM CHLORIDE 0.9% FLUSH
10.0000 mL | INTRAVENOUS | Status: DC | PRN
  Administered 2016-07-24: 10 mL
  Filled 2016-07-22: qty 40

## 2016-07-22 MED ORDER — ENSURE ENLIVE PO LIQD
237.0000 mL | Freq: Two times a day (BID) | ORAL | Status: DC
  Administered 2016-07-22 – 2016-07-24 (×6): 237 mL via ORAL

## 2016-07-22 NOTE — Care Management Note (Signed)
Case Management Note  Patient Details  Name: Chaka Boyson MRN: [id number] Date of Birth: 1941-03-03  Subjective/Objective:       Pneumonia by xray             Action/Plan: home  Expected Discharge Date:                  Expected Discharge Plan:  Home/Self Care  In-House Referral:     Discharge planning Services     Post Acute Care Choice:    Choice offered to:     DME Arranged:    DME Agency:     HH Arranged:    [place] Agency:     Status of Service:  In process, will continue to follow  If discussed at Long Length of Stay Meetings, dates discussed:    Additional Comments:Date:  July 22, 2016 Chart reviewed for concurrent status and case management needs. Will continue to follow the patient for status change: Discharge Planning: following for needs Expected discharge date: [id number] Velva Harman, BSN, [place], [employer]  Leeroy Cha, RN 07/22/2016, 10:51 AM

## 2016-07-22 NOTE — Progress Notes (Signed)
PROGRESS NOTE                                                                                                                                                                                                             Patient Demographics:    Destiny Harrison, is a 75 y.o. female, DOB - [birth year], YBO:[id number]  Admit date - 07/21/2016   Admitting Physician Albertine Patricia, MD  Outpatient Primary MD for the patient is Thressa Sheller, MD  LOS - 1  Outpatient Specialists: Oncology Dr Earlie Server  Chief Complaint  Patient presents with  . CA Pt  . Shortness of Breath  . Cough  . Leg Swelling       Brief Narrative   75 y.o. female, with medical history significant formetastatic non-small cell lung cancer, COPD with chronic hypoxic respiratory failure, chronic pain, and GERD , with multiple recent hospitalization, Admitted with [facility]   Subjective:    Destiny Harrison today has, No headache, No chest pain, No abdominal pain - No Nausea, Report she is feeling much better today, complains of cough.   Assessment  & Plan :    Active Problems:   COPD, moderate ([place])   DNR (do not resuscitate)   Sinus tachycardia ([place])   HCAP (healthcare-associated pneumonia)   Antineoplastic chemotherapy induced anemia   Protein-calorie malnutrition, severe   Acute on chronic respiratory failure ([place])   HCAP -Patient with multiple recent hospitalization, most recently discharged on 9/21 from [facility] for pneumonia. - CT chest showing improvement of left lower pneumonia, but new  left upper lobe pneumonia. - follow on  blood cultures, sputum cultures, Legionella and strep pneumonia antigen - Continue with IV vancomycin and cefepime  Acute on chronic respiratory failure - Patient with chronic hypoxic respiratory failure, on 4 L nasal cannula at baseline, with significantly increased work of breathing on admission. - Appears to be improving, continue with  pulmonary toilet, Mucinex and chest PT  Metastatic lung cancer - Patient's followed by Dr. Earlie Server - Discussed with Dr. Earlie Server, recommend palliative care consulted for goals of care given multiple recent hospitalization.  Anemia - This is secondary to chemotherapy and chronic illness,transfuse as needed  Sinus tachycardia - Secondary to acute illness and respiratory distress,CT chest negative for PE, improving with IV hydration, resume on Cardizem  COPD - Continue with dual  nebs, budesonide, and IV steroids  Severe protein calorie malnutrition - We'll start on supplement  Chronic pain - Continue with when necessary pain medication    Code Status : DNR/DNI  Family Communication  : none at bedside  Disposition Plan  : pending further work up.  Consults  :  None  Procedures  : None  DVT Prophylaxis  :   Heparin - SCDs   Lab Results  Component Value Date   PLT 423 (H) 07/22/2016    Antibiotics  :    Anti-infectives    Start     Dose/Rate Route Frequency Ordered Stop   07/22/16 0600  vancomycin (VANCOCIN) 500 mg in sodium chloride 0.9 % 100 mL IVPB     500 mg 100 mL/hr over 60 Minutes Intravenous Every 12 hours 07/21/16 1847     07/22/16 0000  ceFEPIme (MAXIPIME) 1 g in dextrose 5 % 50 mL IVPB     1 g 100 mL/hr over 30 Minutes Intravenous Every 12 hours 07/21/16 1847     07/21/16 2200  ceFEPIme (MAXIPIME) 1 g in dextrose 5 % 50 mL IVPB  Status:  Discontinued     1 g 100 mL/hr over 30 Minutes Intravenous Every 8 hours 07/21/16 1833 07/21/16 1847   07/21/16 1645  vancomycin (VANCOCIN) IVPB 1000 mg/200 mL premix     1,000 mg 200 mL/hr over 60 Minutes Intravenous  Once 07/21/16 1644 07/21/16 1756   07/21/16 1645  piperacillin-tazobactam (ZOSYN) IVPB 3.375 g     3.375 g 12.5 mL/hr over 240 Minutes Intravenous  Once 07/21/16 1644 07/21/16 2201        Objective:   Vitals:   07/22/16 0400 07/22/16 0600 07/22/16 0800 07/22/16 0912  BP: 111/62 (!) 145/65  112/65 112/65  Pulse: (!) 104 (!) 106 (!) 108 (!) 108  Resp: 10 (!) '25 14 16  '$ Temp:   98.5 F (36.9 C)   TempSrc:   Oral   SpO2: 100% 99% 98% 100%  Weight:      Height:        Wt Readings from Last 3 Encounters:  07/21/16 47 kg (103 lb 9.9 oz)  07/04/16 51.3 kg (113 lb 3.2 oz)  07/02/16 51.7 kg (114 lb)     Intake/Output Summary (Last 24 hours) at 07/22/16 1042 Last data filed at 07/22/16 0800  Gross per 24 hour  Intake            932.5 ml  Output              300 ml  Net            632.5 ml     Physical Exam  Awake Alert, Oriented X 3,  Supple Neck,No JVD,  Symmetrical Chest wall movement, Good air movement bilaterally,scattered rhonchi RRR,No Gallops,Rubs or new Murmurs, No Parasternal Heave +ve B.Sounds, Abd Soft, No tenderness,  No Cyanosis, Clubbing , +1 edema, No new Rash or bruise      Data Review:    CBC  Recent Labs Lab 07/21/16 1305 07/22/16 0553  WBC 11.7* 8.1  HGB 8.0* 8.1*  HCT 25.3* 25.2*  PLT 430* 423*  MCV 86.1 82.4  MCH 27.2 26.5  MCHC 31.6 32.1  RDW 21.0* 20.7*  LYMPHSABS 0.8  --   MONOABS 1.0  --   EOSABS 0.1  --   BASOSABS 0.0  --     Chemistries   Recent Labs Lab 07/21/16 1305 07/22/16 0553  NA 138 136  K 4.1 4.4  CL 97* 94*  CO2 36* 34*  GLUCOSE 127* 146*  BUN 18 18  CREATININE 0.61 0.56  CALCIUM 8.8* 8.6*  AST 17  --   ALT 27  --   ALKPHOS 84  --   BILITOT 0.8  --    ------------------------------------------------------------------------------------------------------------------ No results for input(s): CHOL, HDL, LDLCALC, TRIG, CHOLHDL, LDLDIRECT in the last 72 hours.  Lab Results  Component Value Date   HGBA1C 6.6 (H) 02/13/2016   ------------------------------------------------------------------------------------------------------------------ No results for input(s): TSH, T4TOTAL, T3FREE, THYROIDAB in the last 72 hours.  Invalid input(s):  FREET3 ------------------------------------------------------------------------------------------------------------------ No results for input(s): VITAMINB12, FOLATE, FERRITIN, TIBC, IRON, RETICCTPCT in the last 72 hours.  Coagulation profile No results for input(s): INR, PROTIME in the last 168 hours.  No results for input(s): DDIMER in the last 72 hours.  Cardiac Enzymes  Recent Labs Lab 07/21/16 1305  TROPONINI <0.03   ------------------------------------------------------------------------------------------------------------------    Component Value Date/Time   BNP 84.3 07/21/2016 1305    Inpatient Medications  Scheduled Meds: . budesonide  0.25 mg Nebulization BID  . ceFEPime (MAXIPIME) IV  1 g Intravenous Q12H  . diltiazem  180 mg Oral Daily  . docusate sodium  100 mg Oral BID  . guaiFENesin  600 mg Oral BID  . heparin  5,000 Units Subcutaneous Q8H  . ipratropium-albuterol  3 mL Nebulization QID  . methylPREDNISolone (SOLU-MEDROL) injection  80 mg Intravenous Q12H  . pantoprazole  80 mg Oral Daily  . vancomycin  500 mg Intravenous Q12H   Continuous Infusions: . sodium chloride 50 mL/hr at 07/22/16 0800   PRN Meds:.acetaminophen **OR** acetaminophen, albuterol, benzonatate, morphine injection, ondansetron **OR** ondansetron (ZOFRAN) IV, polyethylene glycol, sodium chloride flush  Micro Results Recent Results (from the past 240 hour(s))  MRSA PCR Screening     Status: None   Collection Time: 07/21/16  6:32 PM  Result Value Ref Range Status   MRSA by PCR NEGATIVE NEGATIVE Final    Comment:        The GeneXpert MRSA Assay (FDA approved for NASAL specimens only), is one component of a comprehensive MRSA colonization surveillance program. It is not intended to diagnose MRSA infection nor to guide or monitor treatment for MRSA infections.     Radiology Reports Dg Chest 2 View  Result Date: 07/21/2016 CLINICAL DATA:  Pt c/o SOB and left side chest pain  onset yesterday. H/o COPD, lung and bone cancer. Former smoker EXAM: CHEST  2 VIEW COMPARISON:  07/11/2016 FINDINGS: Opacity extending superiorly from the left hilum consistent with the patient's known lung carcinoma is without significant change. There is a cystic area in the posterior aspect of the superior segment of the right lower lobe, improved when compared to the CT chest dated 06/22/2016 Lungs show mild thickening of the bronchovascular and interstitial markings most evident in the lung bases. There is opacity along the oblique fissure within the right middle lobe, stable from the prior CT. There are no new areas of lung opacity. No pleural effusion or pneumothorax. Right anterior chest wall Port-A-Cath is stable. Bony thorax is demineralized but grossly intact. IMPRESSION: 1. No acute findings in the lungs. 2. Opacity in the left perihilar region and superior segment of the left lower lobe has improved when compared to the prior chest CT. It is similar to the most recent chest radiograph. No new lung abnormalities. Electronically Signed   By: Lajean Manes M.D.   On: 07/21/2016 12:34   Ct Angio Chest Pe  W And/or Wo Contrast  Result Date: 07/21/2016 CLINICAL DATA:  75 year old female with shortness of breath and productive cough for the past 2 days. Bilateral lower extremity swelling and pain for the past 3-4 weeks. Recent history of pneumonia. Additional history of lung cancer. EXAM: CT ANGIOGRAPHY CHEST WITH CONTRAST TECHNIQUE: Multidetector CT imaging of the chest was performed using the standard protocol during bolus administration of intravenous contrast. Multiplanar CT image reconstructions and MIPs were obtained to evaluate the vascular anatomy. CONTRAST:  100 mL of Isovue 370. COMPARISON:  Chest CT 06/22/2016. FINDINGS: Cardiovascular: No filling defects within the pulmonary arterial tree to suggest underlying pulmonary embolism. Heart size is normal. There is no significant pericardial fluid,  thickening or pericardial calcification. There is aortic atherosclerosis, as well as atherosclerosis of the great vessels of the mediastinum and the coronary arteries, including calcified atherosclerotic plaque in the left main, left anterior descending and left circumflex coronary arteries. Mediastinum/Nodes: Large partially calcified nodal mass in the high right paratracheal nodal station is similar to prior studies measuring up to 3.9 x 3.1 cm on today's examination (image 23 of series 4). Mildly enlarged subcarinal lymph nodes are also similar to prior studies measuring up to 13 mm in short axis. No definite hilar lymphadenopathy. Esophagus is unremarkable in appearance. No axillary lymphadenopathy. Lungs/Pleura: Complete occlusion of the right middle lobe bronchus and chronic atelectasis of the entire right middle lobe is noted. In the perihilar aspect of the collapsed right middle lobe there is a 3.1 x 2.0 cm mass which is surrounded by atelectatic lung, best appreciated on image 50 of series 4, increased in size compared to the prior study 06/22/2016. Worsening cavitation in the posterior aspect of the upper left lung centered along the left major fissure involving portions of the posterior aspect of the left upper lobe and the superior aspect of the superior segment of the left lower lobe. This thick-walled cavity currently measures up to 7.1 x 4.5 x 4.4 cm (axial image 25 of series 11 and sagittal image 100 of series 6). This is contiguous with multiple bronchiectatic bronchi in the left upper lobe. In the adjacent portions of the superior segment of the left lower lobe there is some patchy peripheral airspace consolidation. Widespread ground-glass attenuation and septal thickening is also noted in portions of both lower lobes. Overall, compared to the prior examination from 06/22/2016, much of the previously noted airspace consolidation has resolved. Diffuse bronchial wall thickening with mild  centrilobular emphysema. No pleural effusions. Upper Abdomen: Aortic atherosclerosis. Musculoskeletal: There are no aggressive appearing lytic or blastic lesions noted in the visualized portions of the skeleton. Review of the MIP images confirms the above findings. IMPRESSION: 1. No evidence of pulmonary embolism. 2. Interval enlargement of previously noted perihilar mass in the central aspect of the right middle lobe which currently measures 2.0 x 3.1 cm. 3. Previously noted subcarinal and high right paratracheal lymphadenopathy appears very similar to the prior examination, remain concerning for metastatic disease. 4. Although much of the airspace consolidation previously noted in the superior segment of the left lower lobe has largely resolved, there is increasing cavitation in the upper left lung on either side of the left major fissure, where there is a very large thick-walled cavity on today's study. This is likely infectious in etiology, however, given the thick wall, the possibility of cavitary neoplasm is not excluded. 5. Patchy multifocal the ill-defined airspace disease in the lungs bilaterally, most evident in the left lower lobe. The airspace consolidation  in the left lower lobe has slightly improved compared to the prior examination, mild new patchy airspace consolidation in other regions (for instance, in the dependent right lower lobe) is indicative of new areas of infection, potentially the endobronchial spread. 6. Aortic atherosclerosis, in addition to left main and 2 vessel coronary artery disease. 7. Additional incidental findings, as above. Electronically Signed   By: Vinnie Langton M.D.   On: 07/21/2016 15:48   Ct Angio Chest Pe W/cm &/or Wo Cm  Result Date: 06/22/2016 CLINICAL DATA:  Worsening shortness of breath. Right lung carcinoma. COPD. EXAM: CT ANGIOGRAPHY CHEST WITH CONTRAST TECHNIQUE: Multidetector CT imaging of the chest was performed using the standard protocol during bolus  administration of intravenous contrast. Multiplanar CT image reconstructions and MIPs were obtained to evaluate the vascular anatomy. CONTRAST:  100 mL Isovue 370 COMPARISON:  06/11/2016 FINDINGS: Cardiovascular: Satisfactory opacification of pulmonary arteries noted, and no pulmonary emboli identified. No evidence of thoracic aortic dissection or aneurysm. Normal heart size. Aortic atherosclerosis. Mediastinum/Lymph Nodes: Right paratracheal lymphadenopathy measures 3.4 x 3.4 cm on image 17/4 8 shows no significant change since previous study. Subcarinal lymph node measures 14 mm on image 35/4 compared with 7 mm previously. Lungs/Pleura: Mass in the central right perihilar region currently measures 2.0 x 1.8 cm on image 45/4 compared to 1.5 x 1.8 cm previously. Right middle lobe atelectasis shows no significant change. Moderate emphysema is again demonstrated. Left lung paramediastinal radiation changes are again seen. New airspace disease is seen throughout the majority of the left lower lobe, suspicious for pneumonia. No evidence of pleural effusion. Upper abdomen: No acute findings. Musculoskeletal: No chest wall mass or suspicious bone lesions identified. Review of the MIP images confirms the above findings. IMPRESSION: No evidence of pulmonary embolism. New left lower lobe airspace disease, suspicious for pneumonia. Slight increase in size of central right perihilar lung mass, with stable peripheral right middle lobe atelectasis. Mild increase in subcarinal mediastinal lymphadenopathy. Stable right paratracheal mediastinal lymphadenopathy. Emphysema. Electronically Signed   By: Earle Gell M.D.   On: 06/22/2016 19:26   Dg Chest Portable 1 View  Result Date: 06/22/2016 CLINICAL DATA:  Acute shortness of breath today. History of left lung small cell carcinoma. EXAM: PORTABLE CHEST 1 VIEW COMPARISON:  06/18/2016 FINDINGS: Upper limits normal heart size noted. New left mid lung airspace disease is identified.  Scarring/ posttreatment changes in the left perihilar region again noted. There is no evidence of pneumothorax or pleural effusion. No acute bony abnormalities are present. A right Port-A-Cath with tip overlying the mid -lower SVC again noted. IMPRESSION: New left mid lung airspace opacity, likely pneumonia or aspiration. No other significant change. Electronically Signed   By: Margarette Canada M.D.   On: 06/22/2016 16:06     Fiora Weill M.D on 07/22/2016 at 10:42 AM  Between 7am to 7pm - Pager - [phone]  After 7pm go to [email or web address] - password [facility]  [facility] -  Office  [phone]

## 2016-07-23 DIAGNOSIS — Z515 Encounter for palliative care: Secondary | ICD-10-CM

## 2016-07-23 DIAGNOSIS — J441 Chronic obstructive pulmonary disease with (acute) exacerbation: Secondary | ICD-10-CM | POA: Diagnosis not present

## 2016-07-23 DIAGNOSIS — C3411 Malignant neoplasm of upper lobe, right bronchus or lung: Secondary | ICD-10-CM | POA: Diagnosis not present

## 2016-07-23 DIAGNOSIS — J690 Pneumonitis due to inhalation of food and vomit: Secondary | ICD-10-CM | POA: Diagnosis not present

## 2016-07-23 DIAGNOSIS — J440 Chronic obstructive pulmonary disease with acute lower respiratory infection: Secondary | ICD-10-CM | POA: Diagnosis not present

## 2016-07-23 LAB — BASIC METABOLIC PANEL
ANION GAP: 5 (ref 5–15)
BUN: 18 mg/dL (ref 6–20)
CHLORIDE: 98 mmol/L — AB (ref 101–111)
CO2: 33 mmol/L — ABNORMAL HIGH (ref 22–32)
Calcium: 8.4 mg/dL — ABNORMAL LOW (ref 8.9–10.3)
Creatinine, Ser: 0.57 mg/dL (ref 0.44–1.00)
GFR calc Af Amer: >60 mL/min (ref >60)
GFR calc non Af Amer: >60 mL/min (ref >60)
GLUCOSE: 184 mg/dL — AB (ref 65–99)
POTASSIUM: 4.3 mmol/L (ref 3.5–5.1)
Sodium: 136 mmol/L (ref 135–145)

## 2016-07-23 LAB — CBC
HCT: 27.6 % — ABNORMAL LOW (ref 36.0–46.0)
HEMOGLOBIN: 8.9 g/dL — AB (ref 12.0–15.0)
MCH: 26.9 pg (ref 26.0–34.0)
MCHC: 32.2 g/dL (ref 30.0–36.0)
MCV: 83.4 fL (ref 78.0–100.0)
Platelets: 364 10*3/uL (ref 150–400)
RBC: 3.31 MIL/uL — AB (ref 3.87–5.11)
RDW: 20.3 % — ABNORMAL HIGH (ref 11.5–15.5)
WBC: 9.5 10*3/uL (ref 4.0–10.5)

## 2016-07-23 MED ORDER — MORPHINE SULFATE 15 MG PO TABS
15.0000 mg | ORAL_TABLET | Freq: Once | ORAL | Status: AC
  Administered 2016-07-23: 15 mg via ORAL
  Filled 2016-07-23: qty 1

## 2016-07-23 MED ORDER — MORPHINE SULFATE ER 15 MG PO TBCR
15.0000 mg | EXTENDED_RELEASE_TABLET | Freq: Two times a day (BID) | ORAL | Status: DC
  Administered 2016-07-23 – 2016-07-24 (×3): 15 mg via ORAL
  Filled 2016-07-23 (×3): qty 1

## 2016-07-23 MED ORDER — METHYLPREDNISOLONE SODIUM SUCC 125 MG IJ SOLR
60.0000 mg | Freq: Two times a day (BID) | INTRAMUSCULAR | Status: DC
  Administered 2016-07-23 – 2016-07-24 (×2): 60 mg via INTRAVENOUS
  Filled 2016-07-23 (×2): qty 2

## 2016-07-23 NOTE — Consult Note (Signed)
Consultation Note Date: 07/23/2016   Patient Name: Destiny Harrison  DOB: Jun 22, 1941  MRN: [id number]  Age / Sex: 75 y.o., female  PCP: Thressa Sheller, MD Referring Physician: Albertine Patricia, MD  Reason for Consultation:  Goals of care discussions.   HPI/Patient Profile: 75 y.o. female    admitted on 07/21/2016    Clinical Assessment and Goals of Care:  75 y.o. female, with medical history significant for metastatic non-small cell lung cancer, COPD with chronic hypoxic respiratory failure On 4 L nasal cannula at baseline, chronic pain, and GERD , with multiple recent hospitalization for UTI and pneumonia, Admitted with HCAP.   Patient was seen by palliative provider Ms Franchot Mimes NP in October 2016. At that time, she had elected a code status of DNR DNI and had wanted to continue chemotherapy as long as was deemed appropriate.   I introduced myself and palliative care as follows: Palliative medicine is specialized medical care for people living with serious illness. It focuses on providing relief from the symptoms and stress of a serious illness. The goal is to improve quality of life for both the patient and the family.  Patient is resting in bed, her husband Winferd Humphrey and son Patrick Jupiter are present at the bedside. Patient complains of episodic sharp pleuritic chest pains, she is otherwise in no distress, she is able to speak in full sentences and give her own history.   Patient states she is tired of having recurrent PNAs, how ever, still wishes to continue with her hospitalization. She is eager to discuss with Dr Earlie Server regarding further treatment. Gently introduced concepts specific to symptom based management, palliative care etc. We will continue to follow along. Thank you for the consult.   Husband is primary decision maker     SUMMARY OF RECOMMENDATIONS    DNR DNI Patient is scheduled to meet with her  oncologist Dr Earlie Server later this month for further discussions on treatment, she wishes to discuss with him in this hospitalization.  Continue current pain and non pain symptom management measures.  Code Status/Advance Care Planning:  DNR    Symptom Management:    continue current measures, monitor PRN use.   Palliative Prophylaxis:   Bowel regimen.   Additional Recommendations (Limitations, Scope, Preferences):  Oncology consult.   Psycho-social/Spiritual:   Desire for further Chaplaincy support: no     Prognosis:   guarded  Discharge Planning:  To be determined.       Primary Diagnoses: Present on Admission: . HCAP (healthcare-associated pneumonia) . Acute on chronic respiratory failure ([place]) . Antineoplastic chemotherapy induced anemia . COPD, moderate ([place]) . DNR (do not resuscitate) . Protein-calorie malnutrition, severe . Sinus tachycardia ([place])   I have reviewed the medical record, interviewed the patient and family, and examined the patient. The following aspects are pertinent.  Past Medical History:  Diagnosis Date  . Asthma   . COPD (chronic obstructive pulmonary disease) ([place])   . DNR (do not resuscitate) 06/26/2015  . Dysphagia 12/07/2015  . Encounter for antineoplastic immunotherapy  05/22/2015  . Hiatal hernia   . History of chemotherapy   . History of radiation therapy 03/06/2012   left hilum  . History of radiation therapy 05/10/2013-05/31/2013   Left lung/ 33/75'@2'$ .25 per fraction x 15 fractions  . Hyperlipidemia   . Neutropenia, drug-induced ([place]) 05/05/2012  . Non-small cell lung cancer ([place]) dx'd 08/28/11   left lung  . Primary cancer of right upper lobe of lung ([place]) 02/06/2012  . Radiation 11/15/13-11/26/13   Right hilum 30 Gy in 10 fractions  . Rheumatoid arthritis(714.0)    Social History   Social History  . Marital status: Married    Spouse name: N/A  . Number of children: 4  . Years of education: N/A   Occupational History  .  retired      Civil engineer, contracting  .  Retired   Social History Main Topics  . Smoking status: Former Smoker    Packs/day: 0.50    Years: 40.00    Types: Cigarettes    Quit date: 10/29/2009  . Smokeless tobacco: Never Used  . Alcohol use No  . Drug use: No  . Sexual activity: Not Currently   Other Topics Concern  . None   Social History Narrative   Spiritual person. Member of [employer] at [employer] where she lives since age 74. Never missed a sermon. Sings in choir. CHildren go to same church but husband does not. Stated 10/30/11 she is not afraid of lung cancer diagnosis or prognosis because it is in "god's hand" and "god's will".    Family History  Problem Relation Age of Onset  . Diabetes Mother     insulin dependent  . Breast cancer Mother    Scheduled Meds: . budesonide  0.25 mg Nebulization BID  . ceFEPime (MAXIPIME) IV  1 g Intravenous Q12H  . diltiazem  180 mg Oral Daily  . docusate sodium  100 mg Oral BID  . feeding supplement (ENSURE ENLIVE)  237 mL Oral BID BM  . guaiFENesin  600 mg Oral BID  . heparin  5,000 Units Subcutaneous Q8H  . ipratropium  0.5 mg Nebulization TID  . levalbuterol  1.25 mg Nebulization TID  . methylPREDNISolone (SOLU-MEDROL) injection  60 mg Intravenous Q12H  . morphine  15 mg Oral Q12H  . pantoprazole  80 mg Oral Daily   Continuous Infusions: . sodium chloride 50 mL/hr at 07/23/16 1120   PRN Meds:.acetaminophen **OR** acetaminophen, albuterol, benzonatate, morphine injection, ondansetron **OR** ondansetron (ZOFRAN) IV, polyethylene glycol, sodium chloride flush Medications Prior to Admission:  Prior to Admission medications   Medication Sig Start Date End Date Taking? Authorizing Provider  albuterol (PROAIR HFA) 108 (90 BASE) MCG/ACT inhaler Inhale 2 puffs into the lungs every 6 (six) hours as needed for wheezing or shortness of breath. 08/31/15  Yes Brand Males, MD  albuterol (PROVENTIL) (2.5 MG/3ML) 0.083% nebulizer  solution Take 2.5 mg by nebulization 3 (three) times daily.   Yes Historical Provider, MD  Ascorbic Acid (VITAMIN C PO) Take 1 tablet by mouth daily.   Yes Historical Provider, MD  benzonatate (TESSALON) 100 MG capsule Take 100 mg by mouth every 8 (eight) hours as needed for cough.  01/05/16  Yes Historical Provider, MD  budesonide (PULMICORT) 0.25 MG/2ML nebulizer solution Take 2 mLs (0.25 mg total) by nebulization 2 (two) times daily. 08/20/15  Yes Belkys A Regalado, MD  calcium-vitamin D (OSCAL WITH D) 500-200 MG-UNIT per tablet Take 1 tablet by mouth daily.   Yes Historical  Provider, MD  Cholecalciferol (VITAMIN D PO) Take 1 tablet by mouth daily.   Yes Historical Provider, MD  dexamethasone (DECADRON) 4 MG tablet Take 8 mg by mouth as directed. Take '8mg'$  by mouth twice daily- The day before, of, and the day after chemo 04/09/16  Yes Historical Provider, MD  diltiazem (DILACOR XR) 180 MG 24 hr capsule Take 180 mg by mouth daily.   Yes Historical Provider, MD  famotidine (PEPCID) 20 MG tablet Take 1 tablet (20 mg total) by mouth 2 (two) times daily. 06/06/16  Yes Lavina Hamman, MD  folic acid (FOLVITE) 562 MCG tablet Take 400 mcg by mouth daily.   Yes Historical Provider, MD  guaiFENesin (MUCINEX) 600 MG 12 hr tablet Take 1 tablet (600 mg total) by mouth 2 (two) times daily. Please start in 3 days. Patient taking differently: Take 1,200 mg by mouth 2 (two) times daily. Please start in 3 days. 06/24/16  Yes Albertine Patricia, MD  HYDROcodone-acetaminophen (NORCO/VICODIN) 5-325 MG tablet Take 1 tablet by mouth every 4 (four) hours as needed for moderate pain. 06/13/16  Yes Curt Bears, MD  ipratropium (ATROVENT) 0.02 % nebulizer solution Take 0.5 mg by nebulization 3 (three) times daily.   Yes Historical Provider, MD  IRON PO Take 1 tablet by mouth daily.   Yes Historical Provider, MD  lactose free nutrition (BOOST PLUS) LIQD Take 237 mLs by mouth 2 (two) times daily between meals. 06/06/16  Yes Lavina Hamman, MD  levofloxacin (LEVAQUIN) 500 MG tablet Take 500 mg by mouth daily.   Yes Historical Provider, MD  magnesium oxide (MAG-OX) 400 (241.3 Mg) MG tablet Take 1 tablet (400 mg total) by mouth 2 (two) times daily. 02/18/16  Yes Maryann Mikhail, DO  metoCLOPramide (REGLAN) 10 MG tablet Take 10 mg by mouth every 6 (six) hours as needed for nausea or vomiting.  03/29/16  Yes Historical Provider, MD  morphine (MSIR) 15 MG tablet Take 15 mg by mouth every 4 (four) hours as needed for severe pain.   Yes Historical Provider, MD  ondansetron (ZOFRAN-ODT) 8 MG disintegrating tablet DISSOLVE 1 TABLET BY MOUTH EVERY 8 HOURS AS NEEDED FOR NAUSEA AND VOMITING 04/04/16  Yes Curt Bears, MD  OXYGEN Inhale 3 L into the lungs continuous.    Yes Historical Provider, MD  pantoprazole (PROTONIX) 40 MG tablet Take 80 mg by mouth daily.  05/28/16  Yes Historical Provider, MD  polyethylene glycol (MIRALAX / GLYCOLAX) packet MX AND DRK 1 PACKET PO QD MIXED WITH 8 OUNCES OF FLUID 02/22/16  Yes Historical Provider, MD  potassium chloride SA (K-DUR,KLOR-CON) 20 MEQ tablet Take 1 tablet (20 mEq total) by mouth daily. 05/16/16  Yes Wyatt Portela, MD  simvastatin (ZOCOR) 10 MG tablet Take 10 mg by mouth daily.   Yes Historical Provider, MD  amoxicillin-clavulanate (AUGMENTIN) 875-125 MG tablet Take 1 tablet by mouth every 12 (twelve) hours. Patient not taking: Reported on 07/21/2016 06/27/16   Debbe Odea, MD  diltiazem (DILACOR XR) 240 MG 24 hr capsule Take 1 capsule (240 mg total) by mouth every evening. Patient not taking: Reported on 07/21/2016 06/06/16   Lavina Hamman, MD  furosemide (LASIX) 20 MG tablet Take 20 mg by mouth daily.  04/30/16   Historical Provider, MD  morphine (MS CONTIN) 15 MG 12 hr tablet Take 1 tablet (15 mg total) by mouth every 12 (twelve) hours. Patient not taking: Reported on 07/21/2016 07/04/16   Curt Bears, MD  predniSONE (DELTASONE) 10  MG tablet Take 20 mg tomorrow, 10 mg the next day, 5 mg for 2  days and then stop Patient not taking: Reported on 07/21/2016 06/27/16   Debbe Odea, MD  predniSONE (DELTASONE) 20 MG tablet Take 40 mg by mouth daily with breakfast.    Historical Provider, MD   Allergies  Allergen Reactions  . Shellfish Allergy Swelling   Review of Systems + for pleuritic chest pain  Physical Exam NAD except for episodic pleuritic chest pain S1 S2 Clear anterior Abdomen soft Trace edema Non focal  Vital Signs: BP 138/71 (BP Location: Right Arm)   Pulse (!) 102   Temp 98.5 F (36.9 C) (Oral)   Resp (!) 22   Ht '5\' 5"'$  (1.651 m)   Wt 47 kg (103 lb 9.9 oz)   SpO2 95%   BMI 17.24 kg/m  Pain Assessment: 0-10   Pain Score: Asleep   SpO2: SpO2: 95 % O2 Device:SpO2: 95 % O2 Flow Rate: .O2 Flow Rate (L/min): 4 L/min  IO: Intake/output summary:   Intake/Output Summary (Last 24 hours) at 07/23/16 1615 Last data filed at 07/23/16 1500  Gross per 24 hour  Intake             1400 ml  Output              650 ml  Net              750 ml    LBM: Last BM Date: 07/23/16 (Per patient) Baseline Weight: Weight: 51.3 kg (113 lb) Most recent weight: Weight: 47 kg (103 lb 9.9 oz)     Palliative Assessment/Data:   Flowsheet Rows   Flowsheet Row Most Recent Value  Intake Tab  Referral Department  Hospitalist  Unit at Time of Referral  Oncology Unit  Palliative Care Primary Diagnosis  Cancer  Palliative Care Type  New Palliative care  Reason for referral  Clarify Goals of Care  Date first seen by Palliative Care  07/23/16  Clinical Assessment  Palliative Performance Scale Score  30%  Pain Max last 24 hours  5  Pain Min Last 24 hours  4  Dyspnea Max Last 24 Hours  5  Dyspnea Min Last 24 hours  4  Nausea Max Last 24 Hours  4  Nausea Min Last 24 Hours  3  Anxiety Max Last 24 Hours  4  Anxiety Min Last 24 Hours  3  Psychosocial & Spiritual Assessment  Palliative Care Outcomes  Patient/Family meeting held?  Yes  Who was at the meeting?  patient husband son     Palliative Care Outcomes  Clarified goals of care      Time In: 1500 Time Out: 1615 Time Total: 70 Greater than 50%  of this time was spent counseling and coordinating care related to the above assessment and plan.  Signed by: Loistine Chance, MD  [phone]  Please contact Palliative Medicine Team phone at [phone] for questions and concerns.  For individual provider: See Shea Evans

## 2016-07-23 NOTE — Consult Note (Signed)
   [facility] CM Inpatient Consult   07/23/2016  Destiny Harrison 12-06-1940 [id number]      Patient screened for [facility] services. Chart reviewed. Noted that she is active with [facility]  Palliative home program (not hospice) thru [facility]. Called [facility] at [phone] and spoke with Trish to confirm. Noted Palliative Consult is pending as well. [facility] Care Management not appropriate at this time due to [facility] being active. Will alert inpatient RNCM of the above.  Marthenia Rolling, MSN-Ed, RN,BSN [facility] Liaison [phone]

## 2016-07-23 NOTE — Progress Notes (Signed)
PROGRESS NOTE                                                                                                                                                                                                             Patient Demographics:    Destiny Harrison, is a 75 y.o. female, DOB - 04-Jun-1941, MVH:[id number]  Admit date - 07/21/2016   Admitting Physician Albertine Patricia, MD  Outpatient Primary MD for the patient is Thressa Sheller, MD  LOS - 2  Outpatient Specialists: Oncology Dr Earlie Server  Chief Complaint  Patient presents with  . CA Pt  . Shortness of Breath  . Cough  . Leg Swelling       Brief Narrative   75 y.o. female, with medical history significant formetastatic non-small cell lung cancer, COPD with chronic hypoxic respiratory failure On 4 L nasal cannula at baseline, chronic pain, and GERD , with multiple recent hospitalization for UTI and pneumonia, Admitted with HCAP.   Subjective:    Destiny Harrison today has, No headache, No chest pain, No abdominal pain - No Nausea, Report she is feeling much better today, complains of cough.   Assessment  & Plan :    Active Problems:   COPD, moderate ([place])   DNR (do not resuscitate)   Sinus tachycardia ([place])   HCAP (healthcare-associated pneumonia)   Antineoplastic chemotherapy induced anemia   Protein-calorie malnutrition, severe   Acute on chronic respiratory failure ([place])   HCAP -Patient with multiple recent hospitalization, most recently discharged on 9/21 from [facility] for pneumonia. - CT chest showing improvement of left lower pneumonia, but new  left upper lobe pneumonia. - follow on  blood culturesWith no growth to date, sputum cultures showing gram-negative rods and gram-negative bacilli and yeast, strep pneumonia antigen is negative -Empirically on IV vancomycin and cefepime, will DC IV vancomycin today, and tinea with IV cefepime, also he can be transitioned to oral  antibiotics in 1-2 days.  Acute on chronic respiratory failure - Patient with chronic hypoxic respiratory failure, on 4 L nasal cannula at baseline, with significantly increased work of breathing on admission. - Appears to be improving, continue with pulmonary toilet, Mucinex . - In need of BiPAP since admission, will downgrade to telemetry  Metastatic lung cancer - Patient's followed by Dr. Earlie Server - Discussed with Dr. Earlie Server, recommend  palliative care consulted for goals of care given multiple recent hospitalization.  Anemia - This is secondary to chemotherapy and chronic illness,transfuse as needed  Sinus tachycardia - Secondary to acute illness and respiratory distress,CT chest negative for PE, improving with IV hydration, resumed on Cardizem  COPD - Continue with dual nebs, budesonide, taper IV steroids, hopefully can be changed to by mouth in 1-2 days  Severe protein calorie malnutrition - We'll start on supplement  Chronic pain - Continue with when necessary pain medication    Code Status : DNR/DNI  Family Communication  : none at bedside  Disposition Plan  : Pending PT consult, will transfer to telemetry today.  Consults  :  Discussed with Dr. Inda Merlin  Procedures  : None  DVT Prophylaxis  :   Heparin - SCDs   Lab Results  Component Value Date   PLT 364 07/23/2016    Antibiotics  :    Anti-infectives    Start     Dose/Rate Route Frequency Ordered Stop   07/22/16 0600  vancomycin (VANCOCIN) 500 mg in sodium chloride 0.9 % 100 mL IVPB  Status:  Discontinued     500 mg 100 mL/hr over 60 Minutes Intravenous Every 12 hours 07/21/16 1847 07/23/16 1029   07/22/16 0000  ceFEPIme (MAXIPIME) 1 g in dextrose 5 % 50 mL IVPB     1 g 100 mL/hr over 30 Minutes Intravenous Every 12 hours 07/21/16 1847     07/21/16 2200  ceFEPIme (MAXIPIME) 1 g in dextrose 5 % 50 mL IVPB  Status:  Discontinued     1 g 100 mL/hr over 30 Minutes Intravenous Every 8 hours 07/21/16  1833 07/21/16 1847   07/21/16 1645  vancomycin (VANCOCIN) IVPB 1000 mg/200 mL premix     1,000 mg 200 mL/hr over 60 Minutes Intravenous  Once 07/21/16 1644 07/21/16 1756   07/21/16 1645  piperacillin-tazobactam (ZOSYN) IVPB 3.375 g     3.375 g 12.5 mL/hr over 240 Minutes Intravenous  Once 07/21/16 1644 07/21/16 2201        Objective:   Vitals:   07/23/16 0408 07/23/16 0800 07/23/16 0819 07/23/16 0825  BP:  (!) 146/66    Pulse:  (!) 102 (!) 102   Resp:  16 16   Temp: 98.5 F (36.9 C) 98.1 F (36.7 C)    TempSrc: Axillary Oral    SpO2:  100% 97% 100%  Weight:      Height:        Wt Readings from Last 3 Encounters:  07/21/16 47 kg (103 lb 9.9 oz)  07/04/16 51.3 kg (113 lb 3.2 oz)  07/02/16 51.7 kg (114 lb)     Intake/Output Summary (Last 24 hours) at 07/23/16 1030 Last data filed at 07/23/16 0740  Gross per 24 hour  Intake             1250 ml  Output              650 ml  Net              600 ml     Physical Exam  Awake Alert, Oriented X 3,  Supple Neck,No JVD,  Symmetrical Chest wall movement, Good air movement bilaterally,scattered rhonchi RRR,No Gallops,Rubs or new Murmurs, No Parasternal Heave +ve B.Sounds, Abd Soft, No tenderness,  No Cyanosis, Clubbing , No edema, No new Rash or bruise      Data Review:    CBC  Recent Labs Lab 07/21/16 1305 07/22/16  4496 07/23/16 0515  WBC 11.7* 8.1 9.5  HGB 8.0* 8.1* 8.9*  HCT 25.3* 25.2* 27.6*  PLT 430* 423* 364  MCV 86.1 82.4 83.4  MCH 27.2 26.5 26.9  MCHC 31.6 32.1 32.2  RDW 21.0* 20.7* 20.3*  LYMPHSABS 0.8  --   --   MONOABS 1.0  --   --   EOSABS 0.1  --   --   BASOSABS 0.0  --   --     Chemistries   Recent Labs Lab 07/21/16 1305 07/22/16 0553 07/23/16 0515  NA 138 136 136  K 4.1 4.4 4.3  CL 97* 94* 98*  CO2 36* 34* 33*  GLUCOSE 127* 146* 184*  BUN '18 18 18  '$ CREATININE 0.61 0.56 0.57  CALCIUM 8.8* 8.6* 8.4*  AST 17  --   --   ALT 27  --   --   ALKPHOS 84  --   --   BILITOT 0.8  --    --    ------------------------------------------------------------------------------------------------------------------ No results for input(s): CHOL, HDL, LDLCALC, TRIG, CHOLHDL, LDLDIRECT in the last 72 hours.  Lab Results  Component Value Date   HGBA1C 6.6 (H) 02/13/2016   ------------------------------------------------------------------------------------------------------------------ No results for input(s): TSH, T4TOTAL, T3FREE, THYROIDAB in the last 72 hours.  Invalid input(s): FREET3 ------------------------------------------------------------------------------------------------------------------ No results for input(s): VITAMINB12, FOLATE, FERRITIN, TIBC, IRON, RETICCTPCT in the last 72 hours.  Coagulation profile No results for input(s): INR, PROTIME in the last 168 hours.  No results for input(s): DDIMER in the last 72 hours.  Cardiac Enzymes  Recent Labs Lab 07/21/16 1305  TROPONINI <0.03   ------------------------------------------------------------------------------------------------------------------    Component Value Date/Time   BNP 84.3 07/21/2016 1305    Inpatient Medications  Scheduled Meds: . budesonide  0.25 mg Nebulization BID  . ceFEPime (MAXIPIME) IV  1 g Intravenous Q12H  . diltiazem  180 mg Oral Daily  . docusate sodium  100 mg Oral BID  . feeding supplement (ENSURE ENLIVE)  237 mL Oral BID BM  . guaiFENesin  600 mg Oral BID  . heparin  5,000 Units Subcutaneous Q8H  . ipratropium  0.5 mg Nebulization TID  . levalbuterol  1.25 mg Nebulization TID  . methylPREDNISolone (SOLU-MEDROL) injection  80 mg Intravenous Q12H  . pantoprazole  80 mg Oral Daily   Continuous Infusions: . sodium chloride 50 mL/hr at 07/22/16 1600   PRN Meds:.acetaminophen **OR** acetaminophen, albuterol, benzonatate, morphine injection, ondansetron **OR** ondansetron (ZOFRAN) IV, polyethylene glycol, sodium chloride flush  Micro Results Recent Results (from the past  240 hour(s))  Culture, sputum-assessment     Status: None   Collection Time: 07/21/16 11:59 AM  Result Value Ref Range Status   Specimen Description SPUTUM  Final   Special Requests Immunocompromised  Final   Sputum evaluation THIS SPECIMEN IS ACCEPTABLE FOR SPUTUM CULTURE  Final   Report Status 07/22/2016 FINAL  Final  MRSA PCR Screening     Status: None   Collection Time: 07/21/16  6:32 PM  Result Value Ref Range Status   MRSA by PCR NEGATIVE NEGATIVE Final    Comment:        The GeneXpert MRSA Assay (FDA approved for NASAL specimens only), is one component of a comprehensive MRSA colonization surveillance program. It is not intended to diagnose MRSA infection nor to guide or monitor treatment for MRSA infections.   Culture, respiratory (NON-Expectorated)     Status: None (Preliminary result)   Collection Time: 07/22/16 11:59 AM  Result Value Ref Range Status  Specimen Description SPUTUM  Final   Special Requests NONE  Final   Gram Stain   Final    ABUNDANT WBC PRESENT, PREDOMINANTLY PMN FEW GRAM NEGATIVE RODS FEW GRAM NEGATIVE COCCOBACILLI FEW YEAST WITH PSEUDOHYPHAE    Culture   Final    TOO YOUNG TO READ Performed at [facility]    Report Status PENDING  Incomplete    Radiology Reports Dg Chest 2 View  Result Date: 07/21/2016 CLINICAL DATA:  Pt c/o SOB and left side chest pain onset yesterday. H/o COPD, lung and bone cancer. Former smoker EXAM: CHEST  2 VIEW COMPARISON:  07/11/2016 FINDINGS: Opacity extending superiorly from the left hilum consistent with the patient's known lung carcinoma is without significant change. There is a cystic area in the posterior aspect of the superior segment of the right lower lobe, improved when compared to the CT chest dated 06/22/2016 Lungs show mild thickening of the bronchovascular and interstitial markings most evident in the lung bases. There is opacity along the oblique fissure within the right middle lobe, stable from  the prior CT. There are no new areas of lung opacity. No pleural effusion or pneumothorax. Right anterior chest wall Port-A-Cath is stable. Bony thorax is demineralized but grossly intact. IMPRESSION: 1. No acute findings in the lungs. 2. Opacity in the left perihilar region and superior segment of the left lower lobe has improved when compared to the prior chest CT. It is similar to the most recent chest radiograph. No new lung abnormalities. Electronically Signed   By: Lajean Manes M.D.   On: 07/21/2016 12:34   Ct Angio Chest Pe W And/or Wo Contrast  Result Date: 07/21/2016 CLINICAL DATA:  75 year old female with shortness of breath and productive cough for the past 2 days. Bilateral lower extremity swelling and pain for the past 3-4 weeks. Recent history of pneumonia. Additional history of lung cancer. EXAM: CT ANGIOGRAPHY CHEST WITH CONTRAST TECHNIQUE: Multidetector CT imaging of the chest was performed using the standard protocol during bolus administration of intravenous contrast. Multiplanar CT image reconstructions and MIPs were obtained to evaluate the vascular anatomy. CONTRAST:  100 mL of Isovue 370. COMPARISON:  Chest CT 06/22/2016. FINDINGS: Cardiovascular: No filling defects within the pulmonary arterial tree to suggest underlying pulmonary embolism. Heart size is normal. There is no significant pericardial fluid, thickening or pericardial calcification. There is aortic atherosclerosis, as well as atherosclerosis of the great vessels of the mediastinum and the coronary arteries, including calcified atherosclerotic plaque in the left main, left anterior descending and left circumflex coronary arteries. Mediastinum/Nodes: Large partially calcified nodal mass in the high right paratracheal nodal station is similar to prior studies measuring up to 3.9 x 3.1 cm on today's examination (image 23 of series 4). Mildly enlarged subcarinal lymph nodes are also similar to prior studies measuring up to 13 mm  in short axis. No definite hilar lymphadenopathy. Esophagus is unremarkable in appearance. No axillary lymphadenopathy. Lungs/Pleura: Complete occlusion of the right middle lobe bronchus and chronic atelectasis of the entire right middle lobe is noted. In the perihilar aspect of the collapsed right middle lobe there is a 3.1 x 2.0 cm mass which is surrounded by atelectatic lung, best appreciated on image 50 of series 4, increased in size compared to the prior study 06/22/2016. Worsening cavitation in the posterior aspect of the upper left lung centered along the left major fissure involving portions of the posterior aspect of the left upper lobe and the superior aspect of the superior  segment of the left lower lobe. This thick-walled cavity currently measures up to 7.1 x 4.5 x 4.4 cm (axial image 25 of series 11 and sagittal image 100 of series 6). This is contiguous with multiple bronchiectatic bronchi in the left upper lobe. In the adjacent portions of the superior segment of the left lower lobe there is some patchy peripheral airspace consolidation. Widespread ground-glass attenuation and septal thickening is also noted in portions of both lower lobes. Overall, compared to the prior examination from 06/22/2016, much of the previously noted airspace consolidation has resolved. Diffuse bronchial wall thickening with mild centrilobular emphysema. No pleural effusions. Upper Abdomen: Aortic atherosclerosis. Musculoskeletal: There are no aggressive appearing lytic or blastic lesions noted in the visualized portions of the skeleton. Review of the MIP images confirms the above findings. IMPRESSION: 1. No evidence of pulmonary embolism. 2. Interval enlargement of previously noted perihilar mass in the central aspect of the right middle lobe which currently measures 2.0 x 3.1 cm. 3. Previously noted subcarinal and high right paratracheal lymphadenopathy appears very similar to the prior examination, remain concerning for  metastatic disease. 4. Although much of the airspace consolidation previously noted in the superior segment of the left lower lobe has largely resolved, there is increasing cavitation in the upper left lung on either side of the left major fissure, where there is a very large thick-walled cavity on today's study. This is likely infectious in etiology, however, given the thick wall, the possibility of cavitary neoplasm is not excluded. 5. Patchy multifocal the ill-defined airspace disease in the lungs bilaterally, most evident in the left lower lobe. The airspace consolidation in the left lower lobe has slightly improved compared to the prior examination, mild new patchy airspace consolidation in other regions (for instance, in the dependent right lower lobe) is indicative of new areas of infection, potentially the endobronchial spread. 6. Aortic atherosclerosis, in addition to left main and 2 vessel coronary artery disease. 7. Additional incidental findings, as above. Electronically Signed   By: Vinnie Langton M.D.   On: 07/21/2016 15:48     ELGERGAWY, DAWOOD M.D on 07/23/2016 at 10:30 AM  Between 7am to 7pm - Pager - [phone]  After 7pm go to [email or web address] - password [facility]  [facility] -  Office  [phone]

## 2016-07-23 NOTE — Progress Notes (Signed)
Initial Nutrition Assessment  DOCUMENTATION CODES:   Severe malnutrition in context of chronic illness, Underweight  INTERVENTION:  Continue Ensure Enlive po BID, each supplement provides 350 kcal and 20 grams of protein. Patient only wants chocolate - will update order to reflect this.  Magic cup once daily with dinner tray, each supplement provides 290 kcal and 9 grams of protein.  RD will continue to monitor intake and discussions regarding Goals of Care.  NUTRITION DIAGNOSIS:   Increased nutrient needs related to cancer and cancer related treatments, wound healing, catabolic illness as evidenced by estimated needs.  GOAL:   Patient will meet greater than or equal to 90% of their needs  MONITOR:   PO intake, Supplement acceptance, Weight trends, I & O's, Skin  REASON FOR ASSESSMENT:   Other (Comment) (Low BMI)    ASSESSMENT:   75 y.o. female, with medical history significant for metastatic non-small cell lung cancer, COPD with chronic hypoxic respiratory failure On 4 L nasal cannula at baseline, chronic pain, and GERD , with multiple recent hospitalization for UTI and pneumonia, Admitted with HCAP.   Consult in place for palliative care to discuss Goal of Care in setting of multiple recent hospitalizations, but have not seen patient yet. Will monitor for outcome. Patient known to clinical nutrition due to previous hospitalizations.  Pt reports her appetite is "fairly good." She does endorse that she has loss of taste and early satiety, which impact her ability to eat. Also remains on altered texture diet due to history of dysphagia. She reports attempting to eat 2-3 meals per day, but only finishes about 25-35% of meals. She drinks 2 Boost daily at home. UBW was 137 lbs for most adult life. Most recent UBW has been 117 lbs; pt endorses she has lost weight recently. Per chart she has lost 14 lbs (12% body weight) in one month.   Used CBW of 47 kg to estimated needs.  Pt  meets criteria for severe chronic malnutrition in setting of severe fat depletion and muscle depletion. Also new only severe acute malnutrition in setting of 12% weight loss in 1 month, intake </= 50% for >/= 5 days.  Medications reviewed and include: Colace, methylprednisolone 60 mg Q12hrs, pantoprazole, NS @ 50 ml/hr. Pt already ordered for Ensure Enlive BID.  Labs reviewed: Chloride 98, CO2 33, Glucose 184.  Nutrition-Focused physical exam completed. Findings are severe fat depletion, severe muscle depletion, and mild edema.   Discussed plan with RN.   Diet Order:  DIET DYS 3 Room service appropriate? Yes; Fluid consistency: Thin  Skin:  Wound (see comment) (Stage II to buttocks and labia (4 total))  Last BM:  07/22/2016  Height:   Ht Readings from Last 1 Encounters:  07/21/16 '5\' 5"'$  (1.651 m)    Weight:   Wt Readings from Last 1 Encounters:  07/21/16 103 lb 9.9 oz (47 kg)    Ideal Body Weight:  56.82 kg  BMI:  Body mass index is 17.24 kg/m.  Estimated Nutritional Needs:   Kcal:  6578-4696  Protein:  60-70 grams  Fluid:  1.4-1.6 L/day  EDUCATION NEEDS:   No education needs identified at this time  Willey Blade, MS, RD, LDN Pager: [phone] After Hours Pager: [phone]

## 2016-07-24 DIAGNOSIS — J189 Pneumonia, unspecified organism: Secondary | ICD-10-CM

## 2016-07-24 DIAGNOSIS — T451X5A Adverse effect of antineoplastic and immunosuppressive drugs, initial encounter: Secondary | ICD-10-CM

## 2016-07-24 DIAGNOSIS — J449 Chronic obstructive pulmonary disease, unspecified: Secondary | ICD-10-CM

## 2016-07-24 DIAGNOSIS — E43 Unspecified severe protein-calorie malnutrition: Secondary | ICD-10-CM

## 2016-07-24 DIAGNOSIS — D6481 Anemia due to antineoplastic chemotherapy: Secondary | ICD-10-CM

## 2016-07-24 DIAGNOSIS — R000 Tachycardia, unspecified: Secondary | ICD-10-CM

## 2016-07-24 DIAGNOSIS — J9621 Acute and chronic respiratory failure with hypoxia: Secondary | ICD-10-CM

## 2016-07-24 LAB — BASIC METABOLIC PANEL
ANION GAP: 7 (ref 5–15)
BUN: 18 mg/dL (ref 6–20)
CALCIUM: 8.8 mg/dL — AB (ref 8.9–10.3)
CO2: 32 mmol/L (ref 22–32)
Chloride: 101 mmol/L (ref 101–111)
Creatinine, Ser: 0.43 mg/dL — ABNORMAL LOW (ref 0.44–1.00)
Glucose, Bld: 175 mg/dL — ABNORMAL HIGH (ref 65–99)
Potassium: 4.2 mmol/L (ref 3.5–5.1)
SODIUM: 140 mmol/L (ref 135–145)

## 2016-07-24 LAB — CBC
HCT: 25.5 % — ABNORMAL LOW (ref 36.0–46.0)
Hemoglobin: 8.1 g/dL — ABNORMAL LOW (ref 12.0–15.0)
MCH: 26.7 pg (ref 26.0–34.0)
MCHC: 31.8 g/dL (ref 30.0–36.0)
MCV: 84.2 fL (ref 78.0–100.0)
PLATELETS: 462 10*3/uL — AB (ref 150–400)
RBC: 3.03 MIL/uL — ABNORMAL LOW (ref 3.87–5.11)
RDW: 20.3 % — AB (ref 11.5–15.5)
WBC: 13.3 10*3/uL — AB (ref 4.0–10.5)

## 2016-07-24 MED ORDER — VALACYCLOVIR HCL 1 G PO TABS: 1000.0000 mg | ORAL_TABLET | Freq: Two times a day (BID) | ORAL | 0 refills | Status: AC

## 2016-07-24 MED ORDER — LEVOFLOXACIN 750 MG PO TABS: 750.0000 mg | ORAL_TABLET | ORAL | 0 refills | Status: AC

## 2016-07-24 MED ORDER — HEPARIN SOD (PORK) LOCK FLUSH 100 UNIT/ML IV SOLN
500.0000 [IU] | INTRAVENOUS | Status: AC | PRN
  Administered 2016-07-24: 500 [IU]

## 2016-07-24 MED ORDER — PREDNISONE 20 MG PO TABS: ORAL_TABLET | ORAL | 0 refills | Status: AC

## 2016-07-24 NOTE — Progress Notes (Signed)
Pt is active with [facility] for [facility], PT,NA, referral given to in house rep.

## 2016-07-24 NOTE — Discharge Summary (Addendum)
Physician Discharge Summary  Destiny Harrison ZOX:[id number] DOB: 1941/04/26 DOA: 07/21/2016  PCP: Thressa Sheller, MD  Admit date: 07/21/2016 Discharge date: 07/24/2016  Admitted From: Home Disposition:  Home (resume home health RN and PT)  Recommendations for Outpatient Follow-up:  1. Follow up with PCP in 1-2 weeks. Complete 7 days of antibiotics on 07/28/2016. 2. Follow-up with oncologist Dr. Earlie Server end of the month for goals of care discussion.  Home Health: RN Equipment/Devices: Chronic oxygen (2 L/m)  Discharge Condition: Fair CODE STATUS: DO NOT RESUSCITATE Diet recommendation: Regular   Discharge Diagnoses:  Principal Problem:   Acute on chronic respiratory failure (HCC)   Active Problems:   HCAP (healthcare-associated pneumonia)   COPD, moderate ([place])   Primary cancer of right upper lobe of lung ([place])   DNR (do not resuscitate)   Sinus tachycardia ([place])   Antineoplastic chemotherapy induced anemia   Protein-calorie malnutrition, severe   Pressure ulcer, stage 2  Brief narrative/history of present illness Please refer to admission H&P for details, in brief, 75 year old female with history of metastatic non-small cell lung cancer, COPD with chronic hypoxic respiratory failure on 2-3 L oxygen continuous at home, chronic pain, GERD, multiple hospitalization for UTI and pneumonia was admitted for healthcare associated pneumonia. She was recently hospitalized at [facility] and treated with pneumonia, completed a course of antibiotic and prednisone. In the ED vitals were unremarkable. CT angiogram of the chest was done which was negative for PE showed significant left upper lobe pneumonia and worsening lung cancer. Patient admitted to stepdown unit given increased work of breathing and possible need for BiPAP.  Hospital course Healthcare associated pneumonia Multiple recent hospitalizations. CT chest showing new left upper lobe pneumonia. Blood cultures negative.  Sputum cultures growing gram-negative rods, gram-negative bacilli and yeast. Strep pneumonia antigen negative. Treated empirically with IV vancomycin and cefepime. Remains afebrile and symptoms much improved. Patient will be discharged on oral Levaquin 750 mg every 48 hours to complete total of 7 days of antibiotics. She will also be discharged on 5 more days of oral prednisone.  Acute on chronic respiratory failure Likely a combination of healthcare associated pneumonia and COPD exacerbation. Currently maintaining O2 sat on 3 L via nasal cannula. Symptoms much improved. Will discharge on oral prednisone for 5 more days. Continue home inhalers and nebs.   Metastatic non-small cell lung cancer Follows with Dr. Julien Nordmann. Palliative care consulted for goals of care after discussing with him. Patient already DO NOT RESUSCITATE/DO NOT INTUBATE. Patient discussed with palliative care consult. She is scheduled to meet Dr. Julien Nordmann later this month and plans to discuss with him on further goals of care during that visit. If she gets readmitted prior to her outpatient visit, Dr. Salina April should be consulted for inpatient goals of care discussion.  Sinus tachycardia Secondary to acute illness. CT chest negative for PE. Improved with IV hydration. Continue Cardizem.  COPD Mild exacerbation. Discharged on oral prednisone, continue home nebs and inhaler.  Protein calorie malnutrition, severe Continue supplement  Chronic pain On home medications.  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 H&H stable.   Family communication: None at bedside  Consultants Palliative care   Patient stable for discharge home with outpatient follow-up.   Discharge Instructions     Medication List    STOP taking these medications   amoxicillin-clavulanate 875-125 MG tablet Commonly known as:  AUGMENTIN     TAKE these medications   albuterol (2.5 MG/3ML) 0.083% nebulizer  solution Commonly known  as:  PROVENTIL Take 2.5 mg by nebulization 3 (three) times daily.   albuterol 108 (90 Base) MCG/ACT inhaler Commonly known as:  PROAIR HFA Inhale 2 puffs into the lungs every 6 (six) hours as needed for wheezing or shortness of breath.   benzonatate 100 MG capsule Commonly known as:  TESSALON Take 100 mg by mouth every 8 (eight) hours as needed for cough.   budesonide 0.25 MG/2ML nebulizer solution Commonly known as:  PULMICORT Take 2 mLs (0.25 mg total) by nebulization 2 (two) times daily.   calcium-vitamin D 500-200 MG-UNIT tablet Commonly known as:  OSCAL WITH D Take 1 tablet by mouth daily.   dexamethasone 4 MG tablet Commonly known as:  DECADRON Take 8 mg by mouth as directed. Take '8mg'$  by mouth twice daily- The day before, of, and the day after chemo   diltiazem 180 MG 24 hr capsule Commonly known as:  DILACOR XR Take 180 mg by mouth daily. What changed:  Another medication with the same name was removed. Continue taking this medication, and follow the directions you see here.   famotidine 20 MG tablet Commonly known as:  PEPCID Take 1 tablet (20 mg total) by mouth 2 (two) times daily.   folic acid 921 MCG tablet Commonly known as:  FOLVITE Take 400 mcg by mouth daily.   furosemide 20 MG tablet Commonly known as:  LASIX Take 20 mg by mouth daily.   guaiFENesin 600 MG 12 hr tablet Commonly known as:  MUCINEX Take 1 tablet (600 mg total) by mouth 2 (two) times daily. Please start in 3 days. What changed:  how much to take  additional instructions   HYDROcodone-acetaminophen 5-325 MG tablet Commonly known as:  NORCO/VICODIN Take 1 tablet by mouth every 4 (four) hours as needed for moderate pain.   ipratropium 0.02 % nebulizer solution Commonly known as:  ATROVENT Take 0.5 mg by nebulization 3 (three) times daily.   IRON PO Take 1 tablet by mouth daily.   lactose free nutrition Liqd Take 237 mLs by mouth 2 (two) times daily  between meals.   levofloxacin 750 MG tablet Commonly known as:  LEVAQUIN Take 1 tablet (750 mg total) by mouth every other day. What changed:  medication strength  how much to take  when to take this   magnesium oxide 400 (241.3 Mg) MG tablet Commonly known as:  MAG-OX Take 1 tablet (400 mg total) by mouth 2 (two) times daily.   metoCLOPramide 10 MG tablet Commonly known as:  REGLAN Take 10 mg by mouth every 6 (six) hours as needed for nausea or vomiting.   morphine 15 MG tablet Commonly known as:  MSIR Take 15 mg by mouth every 4 (four) hours as needed for severe pain. What changed:  Another medication with the same name was removed. Continue taking this medication, and follow the directions you see here.   ondansetron 8 MG disintegrating tablet Commonly known as:  ZOFRAN-ODT DISSOLVE 1 TABLET BY MOUTH EVERY 8 HOURS AS NEEDED FOR NAUSEA AND VOMITING   OXYGEN Inhale 3 L into the lungs continuous.   pantoprazole 40 MG tablet Commonly known as:  PROTONIX Take 80 mg by mouth daily.   polyethylene glycol packet Commonly known as:  MIRALAX / GLYCOLAX MX AND DRK 1 PACKET PO QD MIXED WITH 8 OUNCES OF FLUID   potassium chloride SA 20 MEQ tablet Commonly known as:  K-DUR,KLOR-CON Take 1 tablet (20 mEq total) by mouth daily.   predniSONE 20 MG  tablet Commonly known as:  DELTASONE Take 40 mg po daily for 5 days What changed:  medication strength  additional instructions  Another medication with the same name was removed. Continue taking this medication, and follow the directions you see here.   simvastatin 10 MG tablet Commonly known as:  ZOCOR Take 10 mg by mouth daily.   valACYclovir 1000 MG tablet Commonly known as:  VALTREX Take 1 tablet (1,000 mg total) by mouth 2 (two) times daily.   VITAMIN C PO Take 1 tablet by mouth daily.   VITAMIN D PO Take 1 tablet by mouth daily.      Follow-up Information    Thressa Sheller, MD.   Specialty:  Internal  Medicine Contact information: [address], [address] [place] [address] [phone]          Allergies  Allergen Reactions  . Shellfish Allergy Swelling        Procedures/Studies: Dg Chest 2 View  Result Date: 07/21/2016 CLINICAL DATA:  Pt c/o SOB and left side chest pain onset yesterday. H/o COPD, lung and bone cancer. Former smoker EXAM: CHEST  2 VIEW COMPARISON:  07/11/2016 FINDINGS: Opacity extending superiorly from the left hilum consistent with the patient's known lung carcinoma is without significant change. There is a cystic area in the posterior aspect of the superior segment of the right lower lobe, improved when compared to the CT chest dated 06/22/2016 Lungs show mild thickening of the bronchovascular and interstitial markings most evident in the lung bases. There is opacity along the oblique fissure within the right middle lobe, stable from the prior CT. There are no new areas of lung opacity. No pleural effusion or pneumothorax. Right anterior chest wall Port-A-Cath is stable. Bony thorax is demineralized but grossly intact. IMPRESSION: 1. No acute findings in the lungs. 2. Opacity in the left perihilar region and superior segment of the left lower lobe has improved when compared to the prior chest CT. It is similar to the most recent chest radiograph. No new lung abnormalities. Electronically Signed   By: Lajean Manes M.D.   On: 07/21/2016 12:34   Ct Angio Chest Pe W And/or Wo Contrast  Result Date: 07/21/2016 CLINICAL DATA:  75 year old female with shortness of breath and productive cough for the past 2 days. Bilateral lower extremity swelling and pain for the past 3-4 weeks. Recent history of pneumonia. Additional history of lung cancer. EXAM: CT ANGIOGRAPHY CHEST WITH CONTRAST TECHNIQUE: Multidetector CT imaging of the chest was performed using the standard protocol during bolus administration of intravenous contrast. Multiplanar CT image reconstructions and  MIPs were obtained to evaluate the vascular anatomy. CONTRAST:  100 mL of Isovue 370. COMPARISON:  Chest CT 06/22/2016. FINDINGS: Cardiovascular: No filling defects within the pulmonary arterial tree to suggest underlying pulmonary embolism. Heart size is normal. There is no significant pericardial fluid, thickening or pericardial calcification. There is aortic atherosclerosis, as well as atherosclerosis of the great vessels of the mediastinum and the coronary arteries, including calcified atherosclerotic plaque in the left main, left anterior descending and left circumflex coronary arteries. Mediastinum/Nodes: Large partially calcified nodal mass in the high right paratracheal nodal station is similar to prior studies measuring up to 3.9 x 3.1 cm on today's examination (image 23 of series 4). Mildly enlarged subcarinal lymph nodes are also similar to prior studies measuring up to 13 mm in short axis. No definite hilar lymphadenopathy. Esophagus is unremarkable in appearance. No axillary lymphadenopathy. 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 In the perihilar aspect of the collapsed right middle lobe there is a 3.1 x 2.0 cm mass which is surrounded by atelectatic lung, best appreciated on image 50 of series 4, increased in size compared to the prior study 06/22/2016. Worsening cavitation in the posterior aspect of the upper left lung centered along the left major fissure involving portions of the posterior aspect of the left upper lobe and the superior aspect of the superior segment of the left lower lobe. This thick-walled cavity currently measures up to 7.1 x 4.5 x 4.4 cm (axial image 25 of series 11 and sagittal image 100 of series 6). This is contiguous with multiple bronchiectatic bronchi in the left upper lobe. In the adjacent portions of the superior segment of the left lower lobe there is some patchy peripheral  airspace consolidation. Widespread ground-glass attenuation and septal thickening is also noted in portions of both lower lobes. Overall, compared to the prior examination from 06/22/2016, much of the previously noted airspace consolidation has resolved. Diffuse bronchial wall thickening with mild centrilobular emphysema. No pleural effusions. Upper Abdomen: Aortic atherosclerosis. Musculoskeletal: There are no aggressive appearing lytic or blastic lesions noted in the visualized portions of the skeleton. Review of the MIP images confirms the above findings. IMPRESSION: 1. No evidence of pulmonary embolism. 2. Interval enlargement of previously noted perihilar mass in the central aspect of the right middle lobe which currently measures 2.0 x 3.1 cm. 3. Previously noted subcarinal and high right paratracheal lymphadenopathy appears very similar to the prior examination, remain concerning for metastatic disease. 4. Although much of the airspace consolidation previously noted in the superior segment of the left lower lobe has largely resolved, there is increasing cavitation in the upper left lung on either side of the left major fissure, where there is a very large thick-walled cavity on today's study. This is likely infectious in etiology, however, given the thick wall, the possibility of cavitary neoplasm is not excluded. 5. Patchy multifocal the ill-defined airspace disease in the lungs bilaterally, most evident in the left lower lobe. The airspace consolidation in the left lower lobe has slightly improved compared to the prior examination, mild new patchy airspace consolidation in other regions (for instance, in the dependent right lower lobe) is indicative of new areas of infection, potentially the endobronchial spread. 6. Aortic atherosclerosis, in addition to left main and 2 vessel coronary artery disease. 7. Additional incidental findings, as above. Electronically Signed   By: Vinnie Langton M.D.   On:  07/21/2016 15:48    (Echo, Carotid, EGD, Colonoscopy, ERCP)    Subjective:   Discharge Exam: Vitals:   07/23/16 2113 07/24/16 0450  BP: (!) 116/58 133/70  Pulse: (!) 125 100  Resp: 17 18  Temp: 98.3 F (36.8 C) 98.5 F (36.9 C)   Vitals:   07/23/16 2015 07/23/16 2113 07/24/16 0450 07/24/16 0828  BP:  (!) 116/58 133/70   Pulse:  (!) 125 100   Resp:  17 18   Temp:  98.3 F (36.8 C) 98.5 F (36.9 C)   TempSrc:  Oral Oral   SpO2: 98% 100% 100% 99%  Weight:      Height:        General: Elderly female not in distress HEENT: Pallor present, moist mucosa, supple neck Cardiovascular: RRR, S1/S2 +, no rubs, no gallops Respiratory: CTA bilaterally, no wheezing, no rhonchi GI: Soft, NT, ND, bowel sounds + Musculoskeletal: no edema,  no cyanosis CNS: Alert and oriented    The results of significant diagnostics from this hospitalization (including imaging, microbiology, ancillary and laboratory) are listed below for reference.     Microbiology: Recent Results (from the past 240 hour(s))  Culture, sputum-assessment     Status: None   Collection Time: 07/21/16 11:59 AM  Result Value Ref Range Status   Specimen Description SPUTUM  Final   Special Requests Immunocompromised  Final   Sputum evaluation THIS SPECIMEN IS ACCEPTABLE FOR SPUTUM CULTURE  Final   Report Status 07/22/2016 FINAL  Final  MRSA PCR Screening     Status: None   Collection Time: 07/21/16  6:32 PM  Result Value Ref Range Status   MRSA by PCR NEGATIVE NEGATIVE Final    Comment:        The GeneXpert MRSA Assay (FDA approved for NASAL specimens only), is one component of a comprehensive MRSA colonization surveillance program. It is not intended to diagnose MRSA infection nor to guide or monitor treatment for MRSA infections.   Culture, blood (routine x 2) Call MD if unable to obtain prior to antibiotics being given     Status: None (Preliminary result)   Collection Time: 07/21/16  7:27 PM  Result  Value Ref Range Status   Specimen Description BLOOD LEFT ANTECUBITAL  Final   Special Requests IN PEDIATRIC BOTTLE 1.5CC  Final   Culture   Final    NO GROWTH 1 DAY Performed at [facility]    Report Status PENDING  Incomplete  Culture, blood (routine x 2) Call MD if unable to obtain prior to antibiotics being given     Status: None (Preliminary result)   Collection Time: 07/21/16  7:32 PM  Result Value Ref Range Status   Specimen Description BLOOD LEFT HAND  Final   Special Requests IN PEDIATRIC BOTTLE [place]  Final   Culture   Final    NO GROWTH 1 DAY Performed at [facility]    Report Status PENDING  Incomplete  Culture, respiratory (NON-Expectorated)     Status: None (Preliminary result)   Collection Time: 07/22/16 11:59 AM  Result Value Ref Range Status   Specimen Description SPUTUM  Final   Special Requests NONE  Final   Gram Stain   Final    ABUNDANT WBC PRESENT, PREDOMINANTLY PMN FEW GRAM NEGATIVE RODS FEW GRAM NEGATIVE COCCOBACILLI FEW YEAST WITH PSEUDOHYPHAE    Culture   Final    TOO YOUNG TO READ Performed at [facility]    Report Status PENDING  Incomplete     Labs: BNP (last 3 results)  Recent Labs  03/26/16 1242 06/22/16 1539 07/21/16 1305  BNP 183.6* 388.1* 03.5   Basic Metabolic Panel:  Recent Labs Lab 07/21/16 1305 07/22/16 0553 07/23/16 0515 07/24/16 0400  NA 138 136 136 140  K 4.1 4.4 4.3 4.2  CL 97* 94* 98* 101  CO2 36* 34* 33* 32  GLUCOSE 127* 146* 184* 175*  BUN '18 18 18 18  '$ CREATININE 0.61 0.56 0.57 0.43*  CALCIUM 8.8* 8.6* 8.4* 8.8*   Liver Function Tests:  Recent Labs Lab 07/21/16 1305  AST 17  ALT 27  ALKPHOS 84  BILITOT 0.8  PROT 5.9*  ALBUMIN 2.7*   No results for input(s): LIPASE, AMYLASE in the last 168 hours. No results for input(s): AMMONIA in the last 168 hours. CBC:  Recent Labs Lab 07/21/16 1305 07/22/16 0553 07/23/16 0515 07/24/16 0400  WBC 11.7* 8.1 9.5 13.3*  NEUTROABS 9.8*   --   --   --   HGB 8.0* 8.1* 8.9* 8.1*  HCT 25.3* 25.2* 27.6* 25.5*  MCV 86.1 82.4 83.4 84.2  PLT 430* 423* 364 462*   Cardiac Enzymes:  Recent Labs Lab 07/21/16 1305  TROPONINI <0.03   BNP: Invalid input(s): POCBNP CBG: No results for input(s): GLUCAP in the last 168 hours. D-Dimer No results for input(s): DDIMER in the last 72 hours. Hgb A1c No results for input(s): HGBA1C in the last 72 hours. Lipid Profile No results for input(s): CHOL, HDL, LDLCALC, TRIG, CHOLHDL, LDLDIRECT in the last 72 hours. Thyroid function studies No results for input(s): TSH, T4TOTAL, T3FREE, THYROIDAB in the last 72 hours.  Invalid input(s): FREET3 Anemia work up No results for input(s): VITAMINB12, FOLATE, FERRITIN, TIBC, IRON, RETICCTPCT in the last 72 hours. Urinalysis    Component Value Date/Time   COLORURINE YELLOW 06/22/2016 2302   APPEARANCEUR CLEAR 06/22/2016 2302   LABSPEC 1.014 06/22/2016 2302   PHURINE 7.5 06/22/2016 2302   GLUCOSEU NEGATIVE 06/22/2016 2302   HGBUR NEGATIVE 06/22/2016 2302   BILIRUBINUR NEGATIVE 06/22/2016 2302   KETONESUR NEGATIVE 06/22/2016 2302   PROTEINUR NEGATIVE 06/22/2016 2302   UROBILINOGEN 0.2 08/18/2015 0730   NITRITE NEGATIVE 06/22/2016 2302   LEUKOCYTESUR NEGATIVE 06/22/2016 2302   Sepsis Labs Invalid input(s): PROCALCITONIN,  WBC,  LACTICIDVEN Microbiology Recent Results (from the past 240 hour(s))  Culture, sputum-assessment     Status: None   Collection Time: 07/21/16 11:59 AM  Result Value Ref Range Status   Specimen Description SPUTUM  Final   Special Requests Immunocompromised  Final   Sputum evaluation THIS SPECIMEN IS ACCEPTABLE FOR SPUTUM CULTURE  Final   Report Status 07/22/2016 FINAL  Final  MRSA PCR Screening     Status: None   Collection Time: 07/21/16  6:32 PM  Result Value Ref Range Status   MRSA by PCR NEGATIVE NEGATIVE Final    Comment:        The GeneXpert MRSA Assay (FDA approved for NASAL specimens only), is one  component of a comprehensive MRSA colonization surveillance program. It is not intended to diagnose MRSA infection nor to guide or monitor treatment for MRSA infections.   Culture, blood (routine x 2) Call MD if unable to obtain prior to antibiotics being given     Status: None (Preliminary result)   Collection Time: 07/21/16  7:27 PM  Result Value Ref Range Status   Specimen Description BLOOD LEFT ANTECUBITAL  Final   Special Requests IN PEDIATRIC BOTTLE 1.5CC  Final   Culture   Final    NO GROWTH 1 DAY Performed at [facility]    Report Status PENDING  Incomplete  Culture, blood (routine x 2) Call MD if unable to obtain prior to antibiotics being given     Status: None (Preliminary result)   Collection Time: 07/21/16  7:32 PM  Result Value Ref Range Status   Specimen Description BLOOD LEFT HAND  Final   Special Requests IN PEDIATRIC BOTTLE [place]  Final   Culture   Final    NO GROWTH 1 DAY Performed at [facility]    Report Status PENDING  Incomplete  Culture, respiratory (NON-Expectorated)     Status: None (Preliminary result)   Collection Time: 07/22/16 11:59 AM  Result Value Ref Range Status   Specimen Description SPUTUM  Final   Special Requests NONE  Final   Gram Stain   Final    ABUNDANT WBC  PRESENT, PREDOMINANTLY PMN FEW GRAM NEGATIVE RODS FEW GRAM NEGATIVE COCCOBACILLI FEW YEAST WITH PSEUDOHYPHAE    Culture   Final    TOO YOUNG TO READ Performed at [facility]    Report Status PENDING  Incomplete     Time coordinating discharge: Over 30 minutes  SIGNED:   Louellen Molder, MD  [facility] 07/24/2016, 12:02 PM Pager   If 7PM-7AM, please contact night-coverage [email or web address] Password [email or web address]

## 2016-07-25 ENCOUNTER — Other Ambulatory Visit: Payer: Medicare Other

## 2016-07-25 ENCOUNTER — Ambulatory Visit: Payer: Medicare Other

## 2016-07-25 ENCOUNTER — Ambulatory Visit: Payer: Medicare Other | Admitting: Internal Medicine

## 2016-07-25 LAB — CULTURE, RESPIRATORY W GRAM STAIN: Culture: NORMAL

## 2016-07-26 DIAGNOSIS — C3411 Malignant neoplasm of upper lobe, right bronchus or lung: Secondary | ICD-10-CM | POA: Diagnosis not present

## 2016-07-26 DIAGNOSIS — J690 Pneumonitis due to inhalation of food and vomit: Secondary | ICD-10-CM | POA: Diagnosis not present

## 2016-07-26 DIAGNOSIS — J441 Chronic obstructive pulmonary disease with (acute) exacerbation: Secondary | ICD-10-CM | POA: Diagnosis not present

## 2016-07-26 DIAGNOSIS — L89152 Pressure ulcer of sacral region, stage 2: Secondary | ICD-10-CM | POA: Diagnosis not present

## 2016-07-26 DIAGNOSIS — J440 Chronic obstructive pulmonary disease with acute lower respiratory infection: Secondary | ICD-10-CM | POA: Diagnosis not present

## 2016-07-26 DIAGNOSIS — R1310 Dysphagia, unspecified: Secondary | ICD-10-CM | POA: Diagnosis not present

## 2016-07-27 ENCOUNTER — Ambulatory Visit: Payer: Medicare Other

## 2016-07-27 LAB — CULTURE, BLOOD (ROUTINE X 2)
CULTURE: NO GROWTH
Culture: NO GROWTH

## 2016-07-28 DIAGNOSIS — R1310 Dysphagia, unspecified: Secondary | ICD-10-CM | POA: Diagnosis not present

## 2016-07-28 DIAGNOSIS — L89152 Pressure ulcer of sacral region, stage 2: Secondary | ICD-10-CM | POA: Diagnosis not present

## 2016-07-28 DIAGNOSIS — J690 Pneumonitis due to inhalation of food and vomit: Secondary | ICD-10-CM | POA: Diagnosis not present

## 2016-07-28 DIAGNOSIS — C3411 Malignant neoplasm of upper lobe, right bronchus or lung: Secondary | ICD-10-CM | POA: Diagnosis not present

## 2016-07-28 DIAGNOSIS — J440 Chronic obstructive pulmonary disease with acute lower respiratory infection: Secondary | ICD-10-CM | POA: Diagnosis not present

## 2016-07-28 DIAGNOSIS — J441 Chronic obstructive pulmonary disease with (acute) exacerbation: Secondary | ICD-10-CM | POA: Diagnosis not present

## 2016-07-29 DIAGNOSIS — J440 Chronic obstructive pulmonary disease with acute lower respiratory infection: Secondary | ICD-10-CM | POA: Diagnosis not present

## 2016-07-29 DIAGNOSIS — R1310 Dysphagia, unspecified: Secondary | ICD-10-CM | POA: Diagnosis not present

## 2016-07-29 DIAGNOSIS — C3411 Malignant neoplasm of upper lobe, right bronchus or lung: Secondary | ICD-10-CM | POA: Diagnosis not present

## 2016-07-29 DIAGNOSIS — L89152 Pressure ulcer of sacral region, stage 2: Secondary | ICD-10-CM | POA: Diagnosis not present

## 2016-07-29 DIAGNOSIS — J441 Chronic obstructive pulmonary disease with (acute) exacerbation: Secondary | ICD-10-CM | POA: Diagnosis not present

## 2016-07-29 DIAGNOSIS — J690 Pneumonitis due to inhalation of food and vomit: Secondary | ICD-10-CM | POA: Diagnosis not present

## 2016-07-31 ENCOUNTER — Other Ambulatory Visit: Payer: Self-pay | Admitting: Medical Oncology

## 2016-07-31 ENCOUNTER — Other Ambulatory Visit: Payer: Self-pay | Admitting: Internal Medicine

## 2016-07-31 DIAGNOSIS — C3411 Malignant neoplasm of upper lobe, right bronchus or lung: Secondary | ICD-10-CM

## 2016-08-01 ENCOUNTER — Telehealth: Payer: Self-pay | Admitting: *Deleted

## 2016-08-01 ENCOUNTER — Ambulatory Visit: Payer: Medicare Other | Admitting: Nurse Practitioner

## 2016-08-01 ENCOUNTER — Ambulatory Visit: Payer: Medicare Other

## 2016-08-01 ENCOUNTER — Other Ambulatory Visit: Payer: Medicare Other

## 2016-08-01 ENCOUNTER — Ambulatory Visit: Payer: Medicare Other | Admitting: Adult Health

## 2016-08-01 NOTE — Telephone Encounter (Signed)
Oncology Nurse Navigator Documentation  Oncology Nurse Navigator Flowsheets 08/01/2016  Navigator Location [facility]  Navigator Encounter Type Telephone/per Ned Card, I called patient to check on her.  She is doing well without complaints.  I update her on appt change.  She will be seen on 08/09/16 at 9:15.  She verbalized understanding of appt time and place.   Telephone Outgoing Call  Treatment Phase Treatment  Barriers/Navigation Needs Coordination of Care  Interventions Coordination of Care  Coordination of Care Appts  Acuity Level 2  Acuity Level 2 Assistance expediting appointments  Time Spent with Patient 30

## 2016-08-02 DIAGNOSIS — L89152 Pressure ulcer of sacral region, stage 2: Secondary | ICD-10-CM | POA: Diagnosis not present

## 2016-08-02 DIAGNOSIS — R1310 Dysphagia, unspecified: Secondary | ICD-10-CM | POA: Diagnosis not present

## 2016-08-02 DIAGNOSIS — J441 Chronic obstructive pulmonary disease with (acute) exacerbation: Secondary | ICD-10-CM | POA: Diagnosis not present

## 2016-08-02 DIAGNOSIS — J440 Chronic obstructive pulmonary disease with acute lower respiratory infection: Secondary | ICD-10-CM | POA: Diagnosis not present

## 2016-08-02 DIAGNOSIS — J690 Pneumonitis due to inhalation of food and vomit: Secondary | ICD-10-CM | POA: Diagnosis not present

## 2016-08-02 DIAGNOSIS — C3411 Malignant neoplasm of upper lobe, right bronchus or lung: Secondary | ICD-10-CM | POA: Diagnosis not present

## 2016-08-03 ENCOUNTER — Ambulatory Visit: Payer: Medicare Other

## 2016-08-05 ENCOUNTER — Telehealth: Payer: Self-pay | Admitting: *Deleted

## 2016-08-05 DIAGNOSIS — C3490 Malignant neoplasm of unspecified part of unspecified bronchus or lung: Secondary | ICD-10-CM

## 2016-08-05 MED ORDER — HYDROCODONE-ACETAMINOPHEN 5-325 MG PO TABS: 1.0000 | ORAL_TABLET | ORAL | 0 refills | Status: AC | PRN

## 2016-08-05 MED ORDER — MORPHINE SULFATE ER 15 MG PO TBCR: EXTENDED_RELEASE_TABLET | ORAL | 0 refills | Status: DC

## 2016-08-05 NOTE — Telephone Encounter (Signed)
Patient called needing a refill on her Hydrocodone. She asked to be contacted today so that she can be prepared to pick up Prescription

## 2016-08-06 ENCOUNTER — Telehealth: Payer: Self-pay | Admitting: Medical Oncology

## 2016-08-06 DIAGNOSIS — J441 Chronic obstructive pulmonary disease with (acute) exacerbation: Secondary | ICD-10-CM | POA: Diagnosis not present

## 2016-08-06 DIAGNOSIS — J690 Pneumonitis due to inhalation of food and vomit: Secondary | ICD-10-CM | POA: Diagnosis not present

## 2016-08-06 DIAGNOSIS — R1310 Dysphagia, unspecified: Secondary | ICD-10-CM | POA: Diagnosis not present

## 2016-08-06 DIAGNOSIS — C3411 Malignant neoplasm of upper lobe, right bronchus or lung: Secondary | ICD-10-CM | POA: Diagnosis not present

## 2016-08-06 DIAGNOSIS — L89152 Pressure ulcer of sacral region, stage 2: Secondary | ICD-10-CM | POA: Diagnosis not present

## 2016-08-06 DIAGNOSIS — J440 Chronic obstructive pulmonary disease with acute lower respiratory infection: Secondary | ICD-10-CM | POA: Diagnosis not present

## 2016-08-06 NOTE — Telephone Encounter (Signed)
MS contin was written for only # 30 -she needs #60 tablets. I told her she will get another rx on 10/20.

## 2016-08-08 ENCOUNTER — Other Ambulatory Visit: Payer: Medicare Other

## 2016-08-08 DIAGNOSIS — C3411 Malignant neoplasm of upper lobe, right bronchus or lung: Secondary | ICD-10-CM | POA: Diagnosis not present

## 2016-08-08 DIAGNOSIS — L89152 Pressure ulcer of sacral region, stage 2: Secondary | ICD-10-CM | POA: Diagnosis not present

## 2016-08-08 DIAGNOSIS — J690 Pneumonitis due to inhalation of food and vomit: Secondary | ICD-10-CM | POA: Diagnosis not present

## 2016-08-08 DIAGNOSIS — R1310 Dysphagia, unspecified: Secondary | ICD-10-CM | POA: Diagnosis not present

## 2016-08-08 DIAGNOSIS — J441 Chronic obstructive pulmonary disease with (acute) exacerbation: Secondary | ICD-10-CM | POA: Diagnosis not present

## 2016-08-08 DIAGNOSIS — J440 Chronic obstructive pulmonary disease with acute lower respiratory infection: Secondary | ICD-10-CM | POA: Diagnosis not present

## 2016-08-09 ENCOUNTER — Other Ambulatory Visit: Payer: Self-pay | Admitting: *Deleted

## 2016-08-09 ENCOUNTER — Encounter: Payer: Self-pay | Admitting: Internal Medicine

## 2016-08-09 ENCOUNTER — Ambulatory Visit (HOSPITAL_BASED_OUTPATIENT_CLINIC_OR_DEPARTMENT_OTHER): Payer: Medicare Other | Admitting: Internal Medicine

## 2016-08-09 VITALS — BP 127/68 | HR 77 | Temp 99.1°F | Resp 18 | Ht 65.0 in | Wt 101.6 lb

## 2016-08-09 DIAGNOSIS — J449 Chronic obstructive pulmonary disease, unspecified: Secondary | ICD-10-CM | POA: Diagnosis not present

## 2016-08-09 DIAGNOSIS — R0602 Shortness of breath: Secondary | ICD-10-CM | POA: Diagnosis not present

## 2016-08-09 DIAGNOSIS — J189 Pneumonia, unspecified organism: Secondary | ICD-10-CM | POA: Diagnosis not present

## 2016-08-09 DIAGNOSIS — R05 Cough: Secondary | ICD-10-CM

## 2016-08-09 DIAGNOSIS — C779 Secondary and unspecified malignant neoplasm of lymph node, unspecified: Secondary | ICD-10-CM

## 2016-08-09 DIAGNOSIS — C3411 Malignant neoplasm of upper lobe, right bronchus or lung: Secondary | ICD-10-CM | POA: Diagnosis not present

## 2016-08-09 DIAGNOSIS — R634 Abnormal weight loss: Secondary | ICD-10-CM | POA: Diagnosis not present

## 2016-08-09 DIAGNOSIS — E785 Hyperlipidemia, unspecified: Secondary | ICD-10-CM | POA: Diagnosis not present

## 2016-08-09 DIAGNOSIS — Z09 Encounter for follow-up examination after completed treatment for conditions other than malignant neoplasm: Secondary | ICD-10-CM | POA: Diagnosis not present

## 2016-08-09 MED ORDER — MORPHINE SULFATE ER 15 MG PO TBCR: EXTENDED_RELEASE_TABLET | ORAL | 0 refills | Status: AC

## 2016-08-09 NOTE — Progress Notes (Signed)
[place] [phone]   Fax:([phone]) [place], [place], [address] [place] [address]  DIAGNOSIS: Metastatic non-small cell lung cancer, squamous cell carcinoma diagnosed in March of 2013.   PRIOR THERAPY:  1. Status post palliative radiotherapy to the left lung mass under the care of Dr. Pablo Ledger completed on 03/06/2012.  2. Systemic chemotherapy with carboplatin for AUC of 6 on day 1 and Abraxane 100 mg/M2 on days 1, 8 and 15 every 3 weeks. Status post 2 cycles. From cycle 3 forward AUC will be decreased to 4.5 given on day 1 and the Abraxane will be decreased to 90 mg per meter squared on days 1, 8 and 15 every 3 weeks, Status post a total of 3 cycles.  3. Systemic chemotherapy with carboplatin for AUC of 5 on day 1 and gemcitabine 1000 mg/m2 given on day 1 and day 8 every 3 weeks,status post 1 cycle. Due to significant neutropenia she will be dosed reduced beginning cycle 2 forward to carboplatin at an AUC of 4 given on day 1 and gemcitabine at 800 mg per meter squared given on days 1 and 8 every 3 weeks. Status post 6 cycles.  4. Status post palliative radiotherapy to the right hilum under the care of Dr. Pablo Ledger completed on 11/26/2013. 5. Immunotherapy with Nivolumab 240 MG every 2 weeks. First dose expected 03/07/2015. Status post 20 cycles, last dose was given 01/18/2016 discontinued secondary to disease progression. 6. Systemic chemotherapy with docetaxel 60 MG/M2 and Cyramza 10 mg/KG every 3 weeks. First dose 02/08/2016. Status post 7 cycles.  CURRENT THERAPY: Palliative and hospice care.  LIVING WILL AND CODE STATUS: No CODE BLUE   INTERVAL HISTORY: Destiny Harrison 75 y.o. female returns to the clinic today for followup visit accompanied by her husband and son. The patient continues to complain of persistent shortness of breath as well as cough. She was admitted again to the hospital recently  with recurrent pneumonia and COPD exacerbation. Her condition has been declining rapidly over the last few weeks. She is currently on home oxygen and feeling a little bit better today. She denied having any significant chest pain or hemoptysis. She lost few pounds recently. She denied having any fever or chills. She has no nausea or vomiting. She is here today for evaluation and discussion of her treatment options.  MEDICAL HISTORY: Past Medical History:  Diagnosis Date  . Asthma   . COPD (chronic obstructive pulmonary disease) ([place])   . DNR (do not resuscitate) 06/26/2015  . Dysphagia 12/07/2015  . Encounter for antineoplastic immunotherapy 05/22/2015  . Hiatal hernia   . History of chemotherapy   . History of radiation therapy 03/06/2012   left hilum  . History of radiation therapy 05/10/2013-05/31/2013   Left lung/ 33/75'@2'$ .25 per fraction x 15 fractions  . Hyperlipidemia   . Neutropenia, drug-induced ([place]) 05/05/2012  . Non-small cell lung cancer ([place]) dx'd 08/28/11   left lung  . Primary cancer of right upper lobe of lung ([place]) 02/06/2012  . Radiation 11/15/13-11/26/13   Right hilum 30 Gy in 10 fractions  . Rheumatoid arthritis(714.0)     ALLERGIES:  is allergic to shellfish allergy.  MEDICATIONS:  Current Outpatient Prescriptions  Medication Sig Dispense Refill  . albuterol (PROAIR HFA) 108 (90 BASE) MCG/ACT inhaler Inhale 2 puffs into the lungs every 6 (six) hours as needed for wheezing or shortness of breath. 1 Inhaler 5  . albuterol (  PROVENTIL) (2.5 MG/3ML) 0.083% nebulizer solution Take 2.5 mg by nebulization 3 (three) times daily.    . Ascorbic Acid (VITAMIN C PO) Take 1 tablet by mouth daily.    . benzonatate (TESSALON) 100 MG capsule Take 100 mg by mouth every 8 (eight) hours as needed for cough.   1  . budesonide (PULMICORT) 0.25 MG/2ML nebulizer solution Take 2 mLs (0.25 mg total) by nebulization 2 (two) times daily. 60 mL 12  . calcium-vitamin D (OSCAL WITH D) 500-200 MG-UNIT per  tablet Take 1 tablet by mouth daily.    . Cholecalciferol (VITAMIN D PO) Take 1 tablet by mouth daily.    Marland Kitchen dexamethasone (DECADRON) 4 MG tablet Take 8 mg by mouth as directed. Take '8mg'$  by mouth twice daily- The day before, of, and the day after chemo    . diltiazem (DILACOR XR) 180 MG 24 hr capsule Take 180 mg by mouth daily.    . famotidine (PEPCID) 20 MG tablet Take 1 tablet (20 mg total) by mouth 2 (two) times daily. 30 tablet 0  . folic acid (FOLVITE) 086 MCG tablet Take 400 mcg by mouth daily.    . furosemide (LASIX) 20 MG tablet Take 20 mg by mouth daily.   5  . guaiFENesin (MUCINEX) 600 MG 12 hr tablet Take 1 tablet (600 mg total) by mouth 2 (two) times daily. Please start in 3 days. (Patient taking differently: Take 1,200 mg by mouth 2 (two) times daily. Please start in 3 days.) 30 tablet 1  . HYDROcodone-acetaminophen (NORCO/VICODIN) 5-325 MG tablet Take 1 tablet by mouth every 4 (four) hours as needed for moderate pain. 60 tablet 0  . ipratropium (ATROVENT) 0.02 % nebulizer solution Take 0.5 mg by nebulization 3 (three) times daily.    . IRON PO Take 1 tablet by mouth daily.    Marland Kitchen lactose free nutrition (BOOST PLUS) LIQD Take 237 mLs by mouth 2 (two) times daily between meals. 14 Can 0  . magnesium oxide (MAG-OX) 400 (241.3 Mg) MG tablet Take 1 tablet (400 mg total) by mouth 2 (two) times daily. 60 tablet 0  . metoCLOPramide (REGLAN) 10 MG tablet Take 10 mg by mouth every 6 (six) hours as needed for nausea or vomiting.   0  . morphine (MS CONTIN) 15 MG 12 hr tablet TK 1 T PO Q 12 H 30 tablet 0  . ondansetron (ZOFRAN-ODT) 8 MG disintegrating tablet DISSOLVE 1 TABLET BY MOUTH EVERY 8 HOURS AS NEEDED FOR NAUSEA AND VOMITING 20 tablet 0  . OXYGEN Inhale 3 L into the lungs continuous.     . pantoprazole (PROTONIX) 40 MG tablet Take 80 mg by mouth daily.     . polyethylene glycol (MIRALAX / GLYCOLAX) packet MX AND DRK 1 PACKET PO QD MIXED WITH 8 OUNCES OF FLUID  0  . potassium chloride SA  (K-DUR,KLOR-CON) 20 MEQ tablet Take 1 tablet (20 mEq total) by mouth daily. 7 tablet 0  . predniSONE (DELTASONE) 20 MG tablet     . simvastatin (ZOCOR) 10 MG tablet Take 10 mg by mouth daily.     No current facility-administered medications for this visit.     SURGICAL HISTORY:  Past Surgical History:  Procedure Laterality Date  . appendex  1962  . surgery on right wrist    . VIDEO BRONCHOSCOPY  01/28/2012   Procedure: VIDEO BRONCHOSCOPY WITHOUT FLUORO;  Surgeon: Brand Males, MD;  Location: [facility] ENDOSCOPY;  Service: Endoscopy;  Laterality: Bilateral;    REVIEW  OF SYSTEMS:  Constitutional: positive for fatigue and weight loss Eyes: negative Ears, nose, mouth, throat, and face: negative Respiratory: positive for cough, dyspnea on exertion and wheezing Cardiovascular: negative Gastrointestinal: negative Genitourinary:negative Integument/breast: negative Hematologic/lymphatic: negative Musculoskeletal:negative Neurological: negative Behavioral/Psych: negative Endocrine: negative Allergic/Immunologic: negative   PHYSICAL EXAMINATION: General appearance: alert, cooperative and no distress Head: Normocephalic, without obvious abnormality, atraumatic Neck: no adenopathy, no JVD, supple, symmetrical, trachea midline and thyroid not enlarged, symmetric, no tenderness/mass/nodules Lymph nodes: Cervical, supraclavicular, and axillary nodes normal. Resp: clear to auscultation bilaterally Back: symmetric, no curvature. ROM normal. No CVA tenderness. Cardio: regular rate and rhythm, S1, S2 normal, no murmur, click, rub or gallop GI: soft, non-tender; bowel sounds normal; no masses,  no organomegaly Extremities: extremities normal, atraumatic, no cyanosis or edema Neurologic: Alert and oriented X 3, normal strength and tone. Normal symmetric reflexes. Normal coordination and gait  ECOG PERFORMANCE STATUS: 2 - Symptomatic, <50% confined to bed  Blood pressure 127/68, pulse 77, temperature  99.1 F (37.3 C), temperature source Oral, resp. rate 18, height '5\' 5"'$  (1.651 m), weight 101 lb 9.6 oz (46.1 kg), SpO2 98 %.  LABORATORY DATA: Lab Results  Component Value Date   WBC 13.3 (H) 07/24/2016   HGB 8.1 (L) 07/24/2016   HCT 25.5 (L) 07/24/2016   MCV 84.2 07/24/2016   PLT 462 (H) 07/24/2016      Chemistry      Component Value Date/Time   NA 140 07/24/2016 0400   NA 142 07/04/2016 0956   K 4.2 07/24/2016 0400   K 3.7 07/04/2016 0956   CL 101 07/24/2016 0400   CL 101 01/12/2013 0810   CO2 32 07/24/2016 0400   CO2 33 (H) 07/04/2016 0956   BUN 18 07/24/2016 0400   BUN 16.4 07/04/2016 0956   CREATININE 0.43 (L) 07/24/2016 0400   CREATININE 0.7 07/04/2016 0956      Component Value Date/Time   CALCIUM 8.8 (L) 07/24/2016 0400   CALCIUM 8.6 07/04/2016 0956   ALKPHOS 84 07/21/2016 1305   ALKPHOS 95 07/04/2016 0956   AST 17 07/21/2016 1305   AST 12 07/04/2016 0956   ALT 27 07/21/2016 1305   ALT 17 07/04/2016 0956   BILITOT 0.8 07/21/2016 1305   BILITOT 0.44 07/04/2016 0956       RADIOGRAPHIC STUDIES: Dg Chest 2 View  Result Date: 07/21/2016 CLINICAL DATA:  Pt c/o SOB and left side chest pain onset yesterday. H/o COPD, lung and bone cancer. Former smoker EXAM: CHEST  2 VIEW COMPARISON:  07/11/2016 FINDINGS: Opacity extending superiorly from the left hilum consistent with the patient's known lung carcinoma is without significant change. There is a cystic area in the posterior aspect of the superior segment of the right lower lobe, improved when compared to the CT chest dated 06/22/2016 Lungs show mild thickening of the bronchovascular and interstitial markings most evident in the lung bases. There is opacity along the oblique fissure within the right middle lobe, stable from the prior CT. There are no new areas of lung opacity. No pleural effusion or pneumothorax. Right anterior chest wall Port-A-Cath is stable. Bony thorax is demineralized but grossly intact. IMPRESSION:  1. No acute findings in the lungs. 2. Opacity in the left perihilar region and superior segment of the left lower lobe has improved when compared to the prior chest CT. It is similar to the most recent chest radiograph. No new lung abnormalities. Electronically Signed   By: Lajean Manes M.D.   On: 07/21/2016  12:34   Ct Angio Chest Pe W And/or Wo Contrast  Result Date: 07/21/2016 CLINICAL DATA:  75 year old female with shortness of breath and productive cough for the past 2 days. Bilateral lower extremity swelling and pain for the past 3-4 weeks. Recent history of pneumonia. Additional history of lung cancer. EXAM: CT ANGIOGRAPHY CHEST WITH CONTRAST TECHNIQUE: Multidetector CT imaging of the chest was performed using the standard protocol during bolus administration of intravenous contrast. Multiplanar CT image reconstructions and MIPs were obtained to evaluate the vascular anatomy. CONTRAST:  100 mL of Isovue 370. COMPARISON:  Chest CT 06/22/2016. FINDINGS: Cardiovascular: No filling defects within the pulmonary arterial tree to suggest underlying pulmonary embolism. Heart size is normal. There is no significant pericardial fluid, thickening or pericardial calcification. There is aortic atherosclerosis, as well as atherosclerosis of the great vessels of the mediastinum and the coronary arteries, including calcified atherosclerotic plaque in the left main, left anterior descending and left circumflex coronary arteries. Mediastinum/Nodes: Large partially calcified nodal mass in the high right paratracheal nodal station is similar to prior studies measuring up to 3.9 x 3.1 cm on today's examination (image 23 of series 4). Mildly enlarged subcarinal lymph nodes are also similar to prior studies measuring up to 13 mm in short axis. No definite hilar lymphadenopathy. Esophagus is unremarkable in appearance. No axillary lymphadenopathy. Lungs/Pleura: Complete occlusion of the right middle lobe bronchus and chronic  atelectasis of the entire right middle lobe is noted. In the perihilar aspect of the collapsed right middle lobe there is a 3.1 x 2.0 cm mass which is surrounded by atelectatic lung, best appreciated on image 50 of series 4, increased in size compared to the prior study 06/22/2016. Worsening cavitation in the posterior aspect of the upper left lung centered along the left major fissure involving portions of the posterior aspect of the left upper lobe and the superior aspect of the superior segment of the left lower lobe. This thick-walled cavity currently measures up to 7.1 x 4.5 x 4.4 cm (axial image 25 of series 11 and sagittal image 100 of series 6). This is contiguous with multiple bronchiectatic bronchi in the left upper lobe. In the adjacent portions of the superior segment of the left lower lobe there is some patchy peripheral airspace consolidation. Widespread ground-glass attenuation and septal thickening is also noted in portions of both lower lobes. Overall, compared to the prior examination from 06/22/2016, much of the previously noted airspace consolidation has resolved. Diffuse bronchial wall thickening with mild centrilobular emphysema. No pleural effusions. Upper Abdomen: Aortic atherosclerosis. Musculoskeletal: There are no aggressive appearing lytic or blastic lesions noted in the visualized portions of the skeleton. Review of the MIP images confirms the above findings. IMPRESSION: 1. No evidence of pulmonary embolism. 2. Interval enlargement of previously noted perihilar mass in the central aspect of the right middle lobe which currently measures 2.0 x 3.1 cm. 3. Previously noted subcarinal and high right paratracheal lymphadenopathy appears very similar to the prior examination, remain concerning for metastatic disease. 4. Although much of the airspace consolidation previously noted in the superior segment of the left lower lobe has largely resolved, there is increasing cavitation in the upper  left lung on either side of the left major fissure, where there is a very large thick-walled cavity on today's study. This is likely infectious in etiology, however, given the thick wall, the possibility of cavitary neoplasm is not excluded. 5. Patchy multifocal the ill-defined airspace disease in the lungs bilaterally, most evident in  the left lower lobe. The airspace consolidation in the left lower lobe has slightly improved compared to the prior examination, mild new patchy airspace consolidation in other regions (for instance, in the dependent right lower lobe) is indicative of new areas of infection, potentially the endobronchial spread. 6. Aortic atherosclerosis, in addition to left main and 2 vessel coronary artery disease. 7. Additional incidental findings, as above. Electronically Signed   By: Vinnie Langton M.D.   On: 07/21/2016 15:48   ASSESSMENT AND PLAN: This is a very pleasant 75 years old African American female with metastatic non-small cell lung cancer, squamous cell carcinoma status post several chemotherapy regimen and and completed a course of palliative radiotherapy to the right hilum in February 2015.  She completed a course of treatment with immunotherapy with Nivolumab status post 20 cycles. This was discontinued today secondary to disease progression with further enlargement of the large right paratracheal lymph node. I discussed the scan results and showed the images to the patient and her son. I recommended for her to discontinue her current treatment with Nivolumab at this point. She is currently on systemic chemotherapy with docetaxel 60 MG/M2 and Cyramza 10 MG/KG every 3 weeks with Neulasta support. Status post 7 cycles The recent CT scan showed stable disease except for increasing cavitation in the upper left lung likely inflammatory in origin. Unfortunately her general condition has declined recently and the patient was admitted to the hospital several times after every  chemotherapy cycles and the last few months. I had a lengthy discussion with the patient and her family about her condition. I recommended for the patient to discontinue her current treatment with docetaxel and Cyramza at this point. I strongly encouraged her to consider palliative and hospice care. The patient and her family agreed to the current plan. For pain management, she will continue on MS Contin in addition to Vicodin. I gave her a refill of MS Contin today.  I will see her on as-needed basis at this point. She was advised to call immediately if she has any concerning symptoms in the interval.  The patient voices understanding of current disease status and treatment options and is in agreement with the current care plan.  All questions were answered. The patient knows to call the clinic with any problems, questions or concerns. We can certainly see the patient much sooner if necessary.  Disclaimer: This note was dictated with voice recognition software. Similar sounding words can inadvertently be transcribed and may not be corrected upon review.

## 2016-08-13 DIAGNOSIS — J690 Pneumonitis due to inhalation of food and vomit: Secondary | ICD-10-CM | POA: Diagnosis not present

## 2016-08-13 DIAGNOSIS — J441 Chronic obstructive pulmonary disease with (acute) exacerbation: Secondary | ICD-10-CM | POA: Diagnosis not present

## 2016-08-13 DIAGNOSIS — C771 Secondary and unspecified malignant neoplasm of intrathoracic lymph nodes: Secondary | ICD-10-CM | POA: Diagnosis not present

## 2016-08-13 DIAGNOSIS — C3411 Malignant neoplasm of upper lobe, right bronchus or lung: Secondary | ICD-10-CM | POA: Diagnosis not present

## 2016-08-13 DIAGNOSIS — R1310 Dysphagia, unspecified: Secondary | ICD-10-CM | POA: Diagnosis not present

## 2016-08-13 DIAGNOSIS — I10 Essential (primary) hypertension: Secondary | ICD-10-CM | POA: Diagnosis not present

## 2016-08-13 DIAGNOSIS — L89152 Pressure ulcer of sacral region, stage 2: Secondary | ICD-10-CM | POA: Diagnosis not present

## 2016-08-13 DIAGNOSIS — J449 Chronic obstructive pulmonary disease, unspecified: Secondary | ICD-10-CM | POA: Diagnosis not present

## 2016-08-13 DIAGNOSIS — C3490 Malignant neoplasm of unspecified part of unspecified bronchus or lung: Secondary | ICD-10-CM | POA: Diagnosis not present

## 2016-08-13 DIAGNOSIS — K219 Gastro-esophageal reflux disease without esophagitis: Secondary | ICD-10-CM | POA: Diagnosis not present

## 2016-08-13 DIAGNOSIS — E785 Hyperlipidemia, unspecified: Secondary | ICD-10-CM | POA: Diagnosis not present

## 2016-08-13 DIAGNOSIS — J440 Chronic obstructive pulmonary disease with acute lower respiratory infection: Secondary | ICD-10-CM | POA: Diagnosis not present

## 2016-08-14 ENCOUNTER — Telehealth: Payer: Self-pay | Admitting: *Deleted

## 2016-08-14 NOTE — Telephone Encounter (Signed)
I told pt not to come 10/26 , keep nov appt

## 2016-08-14 NOTE — Telephone Encounter (Signed)
"  Why am I scheduled for lab October 26 th.  I will not receive any further chemotherapy.  I also have lab and flush November 2 nd before seeing Ned Card.  Do I need a flush, should I cancel these."  Return number [phone].  Advised port-a-cath will need to be flushed as long as it remains in her body.

## 2016-08-15 ENCOUNTER — Ambulatory Visit: Payer: Medicare Other

## 2016-08-15 ENCOUNTER — Other Ambulatory Visit: Payer: Medicare Other

## 2016-08-15 ENCOUNTER — Ambulatory Visit: Payer: Medicare Other | Admitting: Internal Medicine

## 2016-08-16 ENCOUNTER — Other Ambulatory Visit: Payer: Self-pay | Admitting: Nurse Practitioner

## 2016-08-17 ENCOUNTER — Ambulatory Visit: Payer: Medicare Other

## 2016-08-20 ENCOUNTER — Telehealth: Payer: Self-pay | Admitting: Medical Oncology

## 2016-08-20 NOTE — Telephone Encounter (Signed)
I told pt that her appts are cancelled and her port will be flushed by hospice.

## 2016-08-21 DIAGNOSIS — C771 Secondary and unspecified malignant neoplasm of intrathoracic lymph nodes: Secondary | ICD-10-CM | POA: Diagnosis not present

## 2016-08-21 DIAGNOSIS — E785 Hyperlipidemia, unspecified: Secondary | ICD-10-CM | POA: Diagnosis not present

## 2016-08-21 DIAGNOSIS — C3490 Malignant neoplasm of unspecified part of unspecified bronchus or lung: Secondary | ICD-10-CM | POA: Diagnosis not present

## 2016-08-21 DIAGNOSIS — K219 Gastro-esophageal reflux disease without esophagitis: Secondary | ICD-10-CM | POA: Diagnosis not present

## 2016-08-21 DIAGNOSIS — I10 Essential (primary) hypertension: Secondary | ICD-10-CM | POA: Diagnosis not present

## 2016-08-21 DIAGNOSIS — Z09 Encounter for follow-up examination after completed treatment for conditions other than malignant neoplasm: Secondary | ICD-10-CM | POA: Diagnosis not present

## 2016-08-21 DIAGNOSIS — J449 Chronic obstructive pulmonary disease, unspecified: Secondary | ICD-10-CM | POA: Diagnosis not present

## 2016-08-22 ENCOUNTER — Other Ambulatory Visit: Payer: Medicare Other

## 2016-08-22 ENCOUNTER — Ambulatory Visit: Payer: Medicare Other

## 2016-08-22 ENCOUNTER — Ambulatory Visit: Payer: Medicare Other | Admitting: Nurse Practitioner

## 2016-08-24 ENCOUNTER — Ambulatory Visit: Payer: Medicare Other

## 2016-08-26 ENCOUNTER — Ambulatory Visit: Payer: Medicare Other | Admitting: Internal Medicine

## 2016-08-29 ENCOUNTER — Emergency Department (HOSPITAL_COMMUNITY)
Admission: EM | Admit: 2016-08-29 | Discharge: 2016-08-29 | Disposition: A | Discharge status: Home / Self Care | Attending: Physician Assistant | Admitting: Physician Assistant

## 2016-08-29 ENCOUNTER — Encounter (HOSPITAL_COMMUNITY): Payer: Self-pay | Admitting: Nurse Practitioner

## 2016-08-29 DIAGNOSIS — Z87891 Personal history of nicotine dependence: Secondary | ICD-10-CM | POA: Insufficient documentation

## 2016-08-29 DIAGNOSIS — R0600 Dyspnea, unspecified: Secondary | ICD-10-CM | POA: Diagnosis present

## 2016-08-29 DIAGNOSIS — J441 Chronic obstructive pulmonary disease with (acute) exacerbation: Secondary | ICD-10-CM | POA: Diagnosis not present

## 2016-08-29 DIAGNOSIS — C801 Malignant (primary) neoplasm, unspecified: Secondary | ICD-10-CM | POA: Diagnosis not present

## 2016-08-29 DIAGNOSIS — R069 Unspecified abnormalities of breathing: Secondary | ICD-10-CM | POA: Diagnosis not present

## 2016-08-29 DIAGNOSIS — J9600 Acute respiratory failure, unspecified whether with hypoxia or hypercapnia: Secondary | ICD-10-CM | POA: Diagnosis not present

## 2016-08-29 DIAGNOSIS — R0603 Acute respiratory distress: Secondary | ICD-10-CM

## 2016-08-29 DIAGNOSIS — Z79899 Other long term (current) drug therapy: Secondary | ICD-10-CM | POA: Insufficient documentation

## 2016-08-29 MED ORDER — IPRATROPIUM-ALBUTEROL 0.5-2.5 (3) MG/3ML IN SOLN
3.0000 mL | Freq: Once | RESPIRATORY_TRACT | Status: AC
  Administered 2016-08-29: 3 mL via RESPIRATORY_TRACT
  Filled 2016-08-29: qty 3

## 2016-08-29 MED ORDER — MORPHINE SULFATE (PF) 2 MG/ML IV SOLN
2.0000 mg | Freq: Once | INTRAVENOUS | Status: AC
  Administered 2016-08-29: 2 mg via INTRAVENOUS
  Filled 2016-08-29: qty 1

## 2016-08-29 MED ORDER — SODIUM CHLORIDE 0.9 % IV BOLUS (SEPSIS)
500.0000 mL | Freq: Once | INTRAVENOUS | Status: AC
  Administered 2016-08-29: 500 mL via INTRAVENOUS

## 2016-08-29 MED ORDER — HEPARIN SOD (PORK) LOCK FLUSH 100 UNIT/ML IV SOLN
500.0000 [IU] | Freq: Once | INTRAVENOUS | Status: AC
  Administered 2016-08-29: 500 [IU]
  Filled 2016-08-29: qty 5

## 2016-08-29 MED ORDER — PREDNISONE 20 MG PO TABS: 20.0000 mg | ORAL_TABLET | Freq: Every day | ORAL | 0 refills | Status: AC

## 2016-08-29 MED ORDER — METHYLPREDNISOLONE SODIUM SUCC 125 MG IJ SOLR
125.0000 mg | Freq: Once | INTRAMUSCULAR | Status: AC
  Administered 2016-08-29: 125 mg via INTRAVENOUS
  Filled 2016-08-29: qty 2

## 2016-08-29 NOTE — ED Notes (Signed)
[facility] called  

## 2016-08-29 NOTE — ED Triage Notes (Signed)
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 Patient is DNR but wished to come to the hospital. Very anxious. Sats were 98% resp rate was 30.

## 2016-08-29 NOTE — ED Provider Notes (Signed)
[facility] DEPT Provider Note   CSN: [id number] Arrival date & time: 08/29/16  0806     History   Chief Complaint Chief Complaint  Patient presents with  . Respiratory Distress    HPI Destiny Harrison is a 75 y.o. female.  HPI   Patient is a 75 year old female on hospice for metastatic lung cancer. Patient felt increasing short of breath at home and so wanted to come here to the hospital. She tried using her inhaler did not help. Patient is on multiple medications to help with symptoms. Patient expressed wish to die at her own home.  We're letting patient direct her care today. I will give herin a supportive medications or therapies that she wants. Patient does not want a chest x-ray this time.  Past Medical History:  Diagnosis Date  . Asthma   . COPD (chronic obstructive pulmonary disease) ([place])   . DNR (do not resuscitate) 06/26/2015  . Dysphagia 12/07/2015  . Encounter for antineoplastic immunotherapy 05/22/2015  . Hiatal hernia   . History of chemotherapy   . History of radiation therapy 03/06/2012   left hilum  . History of radiation therapy 05/10/2013-05/31/2013   Left lung/ 33/75'@2'$ .25 per fraction x 15 fractions  . Hyperlipidemia   . Neutropenia, drug-induced ([place]) 05/05/2012  . Non-small cell lung cancer ([place]) dx'd 08/28/11   left lung  . Primary cancer of right upper lobe of lung ([place]) 02/06/2012  . Radiation 11/15/13-11/26/13   Right hilum 30 Gy in 10 fractions  . Rheumatoid arthritis(714.0)     Patient Active Problem List   Diagnosis Date Noted  . COPD, severe ([place]) 07/02/2016  . Physical deconditioning 07/02/2016  . Aspiration pneumonia of left lower lobe due to regurgitated food ([place]) 06/27/2016  . Acute on chronic respiratory failure ([place]) 06/27/2016  . Pressure ulcer, stage 2 06/26/2016  . Sepsis secondary to UTI ([place]) 06/19/2016  . Pressure ulcer 06/19/2016  . Sepsis ([place]) 05/31/2016  . Immunocompromised state ([place]) 05/31/2016  . Protein-calorie malnutrition,  severe 05/30/2016  . Chemotherapy-induced enteritis 05/29/2016  . Chronic hypoxemic respiratory failure ([place]) 05/29/2016  . Antineoplastic chemotherapy induced anemia 05/29/2016  . Nausea & vomiting 05/28/2016  . Hypokalemia 05/28/2016  . Port catheter in place 05/07/2016  . Dehydration 05/07/2016  . Encounter for antineoplastic chemotherapy 04/11/2016  . Chronic pain 12/21/2015  . Dysphagia 12/07/2015  . HCAP (healthcare-associated pneumonia) 08/18/2015  . Palliative care encounter   . DNR (do not resuscitate) 06/26/2015  . Sinus tachycardia ([place]) 06/26/2015  . Encounter for antineoplastic immunotherapy 05/22/2015  . COPD exacerbation ([place]) 04/22/2015  . Fatigue 02/28/2015  . UTI (lower urinary tract infection) 06/24/2014  . Chronic cough 02/06/2014  . Primary cancer of right upper lobe of lung ([place]) 02/06/2012  . COPD, moderate ([place]) 11/18/2011  . Dyspnea 11/04/2011    Past Surgical History:  Procedure Laterality Date  . appendex  1962  . surgery on right wrist    . VIDEO BRONCHOSCOPY  01/28/2012   Procedure: VIDEO BRONCHOSCOPY WITHOUT FLUORO;  Surgeon: Brand Males, MD;  [facility] END[facility]COPY;  Service: Endoscopy;  Laterality: Bilateral;    OB History    No data available       Home Medications    Prior to Admission medications   Medication Sig Start Date End Date Taking? Authorizing Provider  albuterol (PROAIR HFA) 108 (90 BASE) MCG/ACT inhaler Inhale 2 puffs into the lungs every 6 (six) hours as needed for wheezing or shortness of breath. 08/31/15   Brand Males,  MD  albuterol (PROVENTIL) (2.5 MG/3ML) 0.083% nebulizer solution Take 2.5 mg by nebulization 3 (three) times daily.    Historical Provider, MD  Ascorbic Acid (VITAMIN C PO) Take 1 tablet by mouth daily.    Historical Provider, MD  benzonatate (TESSALON) 100 MG capsule Take 100 mg by mouth every 8 (eight) hours as needed for cough.  01/05/16   Historical Provider, MD  budesonide (PULMICORT) 0.25  MG/2ML nebulizer solution Take 2 mLs (0.25 mg total) by nebulization 2 (two) times daily. 08/20/15   Belkys A Regalado, MD  calcium-vitamin D (OSCAL WITH D) 500-200 MG-UNIT per tablet Take 1 tablet by mouth daily.    Historical Provider, MD  Cholecalciferol (VITAMIN D PO) Take 1 tablet by mouth daily.    Historical Provider, MD  dexamethasone (DECADRON) 4 MG tablet Take 8 mg by mouth as directed. Take '8mg'$  by mouth twice daily- The day before, of, and the day after chemo 04/09/16   Historical Provider, MD  diltiazem (DILACOR XR) 180 MG 24 hr capsule Take 180 mg by mouth daily.    Historical Provider, MD  famotidine (PEPCID) 20 MG tablet Take 1 tablet (20 mg total) by mouth 2 (two) times daily. 06/06/16   Lavina Hamman, MD  folic acid (FOLVITE) 935 MCG tablet Take 400 mcg by mouth daily.    Historical Provider, MD  furosemide (LASIX) 20 MG tablet Take 20 mg by mouth daily.  04/30/16   Historical Provider, MD  guaiFENesin (MUCINEX) 600 MG 12 hr tablet Take 1 tablet (600 mg total) by mouth 2 (two) times daily. Please start in 3 days. Patient taking differently: Take 1,200 mg by mouth 2 (two) times daily. Please start in 3 days. 06/24/16   Silver Huguenin Elgergawy, MD  HYDROcodone-acetaminophen (NORCO/VICODIN) 5-325 MG tablet Take 1 tablet by mouth every 4 (four) hours as needed for moderate pain. 08/05/16   Curt Bears, MD  ipratropium (ATROVENT) 0.02 % nebulizer solution Take 0.5 mg by nebulization 3 (three) times daily.    Historical Provider, MD  IRON PO Take 1 tablet by mouth daily.    Historical Provider, MD  lactose free nutrition (BOOST PLUS) LIQD Take 237 mLs by mouth 2 (two) times daily between meals. 06/06/16   Lavina Hamman, MD  magnesium oxide (MAG-OX) 400 (241.3 Mg) MG tablet Take 1 tablet (400 mg total) by mouth 2 (two) times daily. 02/18/16   Maryann Mikhail, DO  metoCLOPramide (REGLAN) 10 MG tablet Take 10 mg by mouth every 6 (six) hours as needed for nausea or vomiting.  03/29/16   Historical  Provider, MD  morphine (MS CONTIN) 15 MG 12 hr tablet TK 1 T PO Q 12 H 08/09/16   Curt Bears, MD  ondansetron (ZOFRAN-ODT) 8 MG disintegrating tablet DISSOLVE 1 TABLET BY MOUTH EVERY 8 HOURS AS NEEDED FOR NAUSEA AND VOMITING 04/04/16   Curt Bears, MD  OXYGEN Inhale 3 L into the lungs continuous.     Historical Provider, MD  pantoprazole (PROTONIX) 40 MG tablet Take 80 mg by mouth daily.  05/28/16   Historical Provider, MD  polyethylene glycol (MIRALAX / GLYCOLAX) packet MX AND DRK 1 PACKET PO QD MIXED WITH 8 OUNCES OF FLUID 02/22/16   Historical Provider, MD  potassium chloride SA (K-DUR,KLOR-CON) 20 MEQ tablet Take 1 tablet (20 mEq total) by mouth daily. 05/16/16   Wyatt Portela, MD  predniSONE (DELTASONE) 20 MG tablet  08/02/16   Historical Provider, MD  simvastatin (ZOCOR) 10 MG tablet Take 10  mg by mouth daily.    Historical Provider, MD    Family History Family History  Problem Relation Age of Onset  . Diabetes Mother     insulin dependent  . Breast cancer Mother     Social History Social History  Substance Use Topics  . Smoking status: Former Smoker    Packs/day: 0.50    Years: 40.00    Types: Cigarettes    Quit date: 10/29/2009  . Smokeless tobacco: Never Used  . Alcohol use No     Allergies   Shellfish allergy   Review of Systems Review of Systems  Respiratory: Positive for cough and shortness of breath.   All other systems reviewed and are negative.    Physical Exam Updated Vital Signs BP 146/63 (BP Location: Right Arm)   Pulse 112   Temp 99.6 F (37.6 C) (Oral)   Resp 24   Ht '5\' 5"'$  (1.651 m)   Wt 101 lb (45.8 kg)   SpO2 100%   BMI 16.81 kg/m   Physical Exam  Constitutional: She is oriented to person, place, and time. She appears well-developed and well-nourished.  Cachectic frail elderly female.  HENT:  Head: Normocephalic and atraumatic.  Eyes: Conjunctivae are normal. Right eye exhibits no discharge.  Neck: Neck supple.  Cardiovascular:    No murmur heard. Tachycardia.  Pulmonary/Chest:  Patient currently tachypnea, no wheezing auscultated, decreased breath sounds diffusely.  Abdominal: Soft. She exhibits no distension. There is no tenderness.  Musculoskeletal: Normal range of motion. She exhibits no edema.  Neurological: She is oriented to person, place, and time. No cranial nerve deficit.  Skin: Skin is warm and dry. No rash noted. She is not diaphoretic.  Psychiatric: She has a normal mood and affect. Her behavior is normal.  Nursing note and vitals reviewed.    ED Treatments / Results  Labs (all labs ordered are listed, but only abnormal results are displayed) Labs Reviewed - No data to display  EKG  EKG Interpretation None       Radiology No results found.  Procedures Procedures (including critical care time)  Medications Ordered in ED Medications  morphine 2 MG/ML injection 2 mg (not administered)  methylPREDNISolone sodium succinate (SOLU-MEDROL) 125 mg/2 mL injection 125 mg (not administered)  ipratropium-albuterol (DUONEB) 0.5-2.5 (3) MG/3ML nebulizer solution 3 mL (not administered)     Initial Impression / Assessment and Plan / ED Course  I have reviewed the triage vital signs and the nursing notes.  Pertinent labs & imaging results that were available during my care of the patient were reviewed by me and considered in my medical decision making (see chart for details).  Clinical Course     Patient is an incredibly pleasant 75 year old female with metastatic lung cancer on hospice. Patient is a DO NOT RESUSCITATE. I'm letting patient direct her care. We decided to give her medication, morphine to help with air hunger, Solu-Medrol to help with inflammation in the lungs.  Patietn much improved. Wants to go home.  Discussed with hospice, they will visit today!  Final Clinical Impressions(s) / ED Diagnoses   Final diagnoses:  None    New Prescriptions New Prescriptions   No  medications on file     Gaines Cartmell Julio Alm, MD 08/29/16 1151

## 2016-08-29 NOTE — ED Notes (Signed)
Bed: WA06 Expected date:  Expected time:  Means of arrival:  Comments: EMS 75 yo female Resp distress 98% 4l/[place]-terminal lung cancer/Hospice-HR 110 currently on NRB

## 2016-08-29 NOTE — ED Notes (Signed)
Port right chest accessed.

## 2016-08-29 NOTE — Discharge Instructions (Signed)
Please use the increased steroids to help with symptoms.

## 2016-08-29 NOTE — ED Notes (Signed)
Port de-accessed

## 2016-09-09 ENCOUNTER — Other Ambulatory Visit: Payer: Self-pay | Admitting: Internal Medicine

## 2016-09-11 ENCOUNTER — Ambulatory Visit: Payer: Medicare Other | Admitting: Internal Medicine

## 2016-09-11 ENCOUNTER — Other Ambulatory Visit: Payer: Medicare Other

## 2016-09-11 ENCOUNTER — Ambulatory Visit: Payer: Medicare Other

## 2016-09-13 ENCOUNTER — Ambulatory Visit: Payer: Medicare Other

## 2016-09-20 DIAGNOSIS — E785 Hyperlipidemia, unspecified: Secondary | ICD-10-CM | POA: Diagnosis not present

## 2016-09-20 DIAGNOSIS — C3490 Malignant neoplasm of unspecified part of unspecified bronchus or lung: Secondary | ICD-10-CM | POA: Diagnosis not present

## 2016-09-20 DIAGNOSIS — K219 Gastro-esophageal reflux disease without esophagitis: Secondary | ICD-10-CM | POA: Diagnosis not present

## 2016-09-20 DIAGNOSIS — J449 Chronic obstructive pulmonary disease, unspecified: Secondary | ICD-10-CM | POA: Diagnosis not present

## 2016-09-20 DIAGNOSIS — C771 Secondary and unspecified malignant neoplasm of intrathoracic lymph nodes: Secondary | ICD-10-CM | POA: Diagnosis not present

## 2016-09-20 DIAGNOSIS — I10 Essential (primary) hypertension: Secondary | ICD-10-CM | POA: Diagnosis not present

## 2016-09-27 ENCOUNTER — Emergency Department (HOSPITAL_COMMUNITY)
Admission: EM | Admit: 2016-09-27 | Discharge: 2016-09-27 | Disposition: A | Discharge status: Home / Self Care | Attending: Emergency Medicine | Admitting: Emergency Medicine

## 2016-09-27 ENCOUNTER — Emergency Department (HOSPITAL_COMMUNITY)

## 2016-09-27 ENCOUNTER — Encounter (HOSPITAL_COMMUNITY): Payer: Self-pay | Admitting: Emergency Medicine

## 2016-09-27 DIAGNOSIS — R0600 Dyspnea, unspecified: Secondary | ICD-10-CM | POA: Diagnosis not present

## 2016-09-27 DIAGNOSIS — Z87891 Personal history of nicotine dependence: Secondary | ICD-10-CM | POA: Diagnosis not present

## 2016-09-27 DIAGNOSIS — J449 Chronic obstructive pulmonary disease, unspecified: Secondary | ICD-10-CM | POA: Insufficient documentation

## 2016-09-27 DIAGNOSIS — R0602 Shortness of breath: Secondary | ICD-10-CM | POA: Diagnosis not present

## 2016-09-27 LAB — URINALYSIS, ROUTINE W REFLEX MICROSCOPIC
Bacteria, UA: NONE SEEN
Bilirubin Urine: NEGATIVE
Glucose, UA: NEGATIVE mg/dL
Hgb urine dipstick: NEGATIVE
Ketones, ur: NEGATIVE mg/dL
Nitrite: NEGATIVE
Protein, ur: NEGATIVE mg/dL
Specific Gravity, Urine: 1.009 (ref 1.005–1.030)
pH: 7 (ref 5.0–8.0)

## 2016-09-27 MED ORDER — IPRATROPIUM-ALBUTEROL 0.5-2.5 (3) MG/3ML IN SOLN
3.0000 mL | Freq: Once | RESPIRATORY_TRACT | Status: AC
  Administered 2016-09-27: 3 mL via RESPIRATORY_TRACT
  Filled 2016-09-27: qty 3

## 2016-09-27 MED ORDER — ALBUTEROL SULFATE (2.5 MG/3ML) 0.083% IN NEBU
5.0000 mg | INHALATION_SOLUTION | Freq: Once | RESPIRATORY_TRACT | Status: DC
  Filled 2016-09-27: qty 6

## 2016-09-27 MED ORDER — HEPARIN SOD (PORK) LOCK FLUSH 100 UNIT/ML IV SOLN
500.0000 [IU] | Freq: Once | INTRAVENOUS | Status: DC
  Filled 2016-09-27: qty 5

## 2016-09-27 MED ORDER — HYDROMORPHONE HCL 1 MG/ML IJ SOLN
0.7500 mg | Freq: Once | INTRAMUSCULAR | Status: AC
  Administered 2016-09-27: 0.75 mg via INTRAVENOUS
  Filled 2016-09-27: qty 1

## 2016-09-27 MED ORDER — LORAZEPAM 2 MG/ML IJ SOLN
0.5000 mg | Freq: Once | INTRAMUSCULAR | Status: AC
  Administered 2016-09-27: 0.5 mg via INTRAVENOUS
  Filled 2016-09-27: qty 1

## 2016-09-27 MED ORDER — ACETAMINOPHEN 325 MG PO TABS
650.0000 mg | ORAL_TABLET | Freq: Once | ORAL | Status: AC
  Administered 2016-09-27: 650 mg via ORAL
  Filled 2016-09-27: qty 2

## 2016-09-27 MED ORDER — AZITHROMYCIN 250 MG PO TABS: 250.0000 mg | ORAL_TABLET | Freq: Every day | ORAL | 0 refills | Status: AC

## 2016-09-27 MED ORDER — AZITHROMYCIN 250 MG PO TABS
500.0000 mg | ORAL_TABLET | Freq: Once | ORAL | Status: AC
  Administered 2016-09-27: 500 mg via ORAL
  Filled 2016-09-27: qty 2

## 2016-09-27 MED ORDER — HEPARIN SOD (PORK) LOCK FLUSH 100 UNIT/ML IV SOLN
500.0000 [IU] | Freq: Once | INTRAVENOUS | Status: AC
  Administered 2016-09-27: 500 [IU]

## 2016-09-27 NOTE — ED Notes (Signed)
Off floor for testing 

## 2016-09-27 NOTE — ED Notes (Signed)
Patient states she is feeling much better-decreased work of breathing-family at bedside

## 2016-09-27 NOTE — ED Notes (Signed)
RT called

## 2016-09-27 NOTE — ED Triage Notes (Signed)
Per pt, states increased SOB since 0100-states breathing treatments not helping-tachy on the monitor-chest tightness

## 2016-09-27 NOTE — ED Provider Notes (Signed)
[facility] DEPT Provider Note    By signing my name below, I, Bea Graff, attest that this documentation has been prepared under the direction and in the presence of Virgel Manifold, MD. Electronically Signed: Bea Graff, ED Scribe. 09/27/16. 2:39 PM.   History   Chief Complaint Chief Complaint  Patient presents with  . Shortness of Breath    The history is provided by the patient and medical records. No language interpreter was used.    HPI Comments:  Destiny Harrison is a 75 y.o. female with PMHx of asthma, COPD and left lung cancer who presents to the Emergency Department complaining of increased SOB that began early this morning. She reports associated CP and productive cough of phlegm that is normal at baseline for her. She has used her nebulizer treatments with no significant relief of the symptoms. She denies modifying factors. She denies leg swelling, nausea, vomiting, abdominal pain, dysuria or difficulty urinating. Pt has a DNR. She is on home oxygen at 4/L.   Past Medical History:  Diagnosis Date  . Asthma   . COPD (chronic obstructive pulmonary disease) ([place])   . DNR (do not resuscitate) 06/26/2015  . Dysphagia 12/07/2015  . Encounter for antineoplastic immunotherapy 05/22/2015  . Hiatal hernia   . History of chemotherapy   . History of radiation therapy 03/06/2012   left hilum  . History of radiation therapy 05/10/2013-05/31/2013   Left lung/ 33/75'@2'$ .25 per fraction x 15 fractions  . Hyperlipidemia   . Neutropenia, drug-induced ([place]) 05/05/2012  . Non-small cell lung cancer ([place]) dx'd 08/28/11   left lung  . Primary cancer of right upper lobe of lung ([place]) 02/06/2012  . Radiation 11/15/13-11/26/13   Right hilum 30 Gy in 10 fractions  . Rheumatoid arthritis(714.0)     Patient Active Problem List   Diagnosis Date Noted  . COPD, severe ([place]) 07/02/2016  . Physical deconditioning 07/02/2016  . Aspiration pneumonia of left lower lobe due to regurgitated food ([place])  06/27/2016  . Acute on chronic respiratory failure ([place]) 06/27/2016  . Pressure ulcer, stage 2 06/26/2016  . Sepsis secondary to UTI ([place]) 06/19/2016  . Pressure ulcer 06/19/2016  . Sepsis ([place]) 05/31/2016  . Immunocompromised state ([place]) 05/31/2016  . Protein-calorie malnutrition, severe 05/30/2016  . Chemotherapy-induced enteritis 05/29/2016  . Chronic hypoxemic respiratory failure ([place]) 05/29/2016  . Antineoplastic chemotherapy induced anemia 05/29/2016  . Nausea & vomiting 05/28/2016  . Hypokalemia 05/28/2016  . Port catheter in place 05/07/2016  . Dehydration 05/07/2016  . Encounter for antineoplastic chemotherapy 04/11/2016  . Chronic pain 12/21/2015  . Dysphagia 12/07/2015  . HCAP (healthcare-associated pneumonia) 08/18/2015  . Palliative care encounter   . DNR (do not resuscitate) 06/26/2015  . Sinus tachycardia ([place]) 06/26/2015  . Encounter for antineoplastic immunotherapy 05/22/2015  . COPD exacerbation ([place]) 04/22/2015  . Fatigue 02/28/2015  . UTI (lower urinary tract infection) 06/24/2014  . Chronic cough 02/06/2014  . Primary cancer of right upper lobe of lung ([place]) 02/06/2012  . COPD, moderate ([place]) 11/18/2011  . Dyspnea 11/04/2011    Past Surgical History:  Procedure Laterality Date  . appendex  1962  . surgery on right wrist    . VIDEO BRONCHOSCOPY  01/28/2012   Procedure: VIDEO BRONCHOSCOPY WITHOUT FLUORO;  Surgeon: Brand Males, MD;  [facility]COPY;  Service: Endoscopy;  Laterality: Bilateral;    OB History    No data available       Home Medications    Prior to Admission medications   Medication  Sig Start Date End Date Taking? Authorizing Provider  albuterol (PROVENTIL) (2.5 MG/3ML) 0.083% nebulizer solution Take 2.5 mg by nebulization 3 (three) times daily.   Yes Historical Provider, MD  Ascorbic Acid (VITAMIN C PO) Take 1 tablet by mouth daily.   Yes Historical Provider, MD  budesonide (PULMICORT) 0.25 MG/2ML nebulizer solution Take 2  mLs (0.25 mg total) by nebulization 2 (two) times daily. 08/20/15  Yes Belkys A Regalado, MD  calcium-vitamin D (OSCAL WITH D) 500-200 MG-UNIT per tablet Take 1 tablet by mouth daily.   Yes Historical Provider, MD  Cholecalciferol (VITAMIN D PO) Take 1 tablet by mouth daily.   Yes Historical Provider, MD  diltiazem (DILACOR XR) 180 MG 24 hr capsule Take 180 mg by mouth daily.   Yes Historical Provider, MD  famotidine (PEPCID) 20 MG tablet Take 1 tablet (20 mg total) by mouth 2 (two) times daily. 06/06/16  Yes Lavina Hamman, MD  folic acid (FOLVITE) 235 MCG tablet Take 400 mcg by mouth daily.   Yes Historical Provider, MD  furosemide (LASIX) 20 MG tablet Take 20 mg by mouth daily.  04/30/16  Yes Historical Provider, MD  guaiFENesin (MUCINEX) 600 MG 12 hr tablet Take 1 tablet (600 mg total) by mouth 2 (two) times daily. Please start in 3 days. 06/24/16  Yes Albertine Patricia, MD  HYDROcodone-acetaminophen (NORCO/VICODIN) 5-325 MG tablet Take 1 tablet by mouth every 4 (four) hours as needed for moderate pain. 08/05/16  Yes Curt Bears, MD  ipratropium (ATROVENT) 0.02 % nebulizer solution Take 0.5 mg by nebulization 3 (three) times daily.   Yes Historical Provider, MD  IRON PO Take 1 tablet by mouth daily.   Yes Historical Provider, MD  magnesium oxide (MAG-OX) 400 (241.3 Mg) MG tablet Take 1 tablet (400 mg total) by mouth 2 (two) times daily. 02/18/16  Yes Maryann Mikhail, DO  morphine (MS CONTIN) 15 MG 12 hr tablet TK 1 T PO Q 12 H Patient taking differently: Take 15 mg by mouth every 12 (twelve) hours.  08/09/16  Yes Curt Bears, MD  ondansetron (ZOFRAN-ODT) 8 MG disintegrating tablet DISSOLVE 1 TABLET BY MOUTH EVERY 8 HOURS AS NEEDED FOR NAUSEA AND VOMITING 04/04/16  Yes Curt Bears, MD  OXYGEN Inhale 4 L into the lungs continuous.    Yes Historical Provider, MD  pantoprazole (PROTONIX) 40 MG tablet Take 80 mg by mouth daily.  05/28/16  Yes Historical Provider, MD  polyethylene glycol (MIRALAX  / GLYCOLAX) packet Take 17 g by mouth daily.   Yes Historical Provider, MD  potassium chloride SA (K-DUR,KLOR-CON) 20 MEQ tablet Take 1 tablet (20 mEq total) by mouth daily. 05/16/16  Yes Wyatt Portela, MD  PROAIR HFA 108 (90 Base) MCG/ACT inhaler INHALE 2 PUFFS BY MOUTH EVERY 6 HOURS AS NEEDED FOR WHEEZING OR SHORTNESS OF BREATH 09/11/16  Yes Brand Males, MD  simvastatin (ZOCOR) 10 MG tablet Take 10 mg by mouth at bedtime.    Yes Historical Provider, MD  azithromycin (ZITHROMAX) 250 MG tablet Take 1 tablet (250 mg total) by mouth daily. Take 1 tablet daily starting on 09/28/16. 09/27/16   Virgel Manifold, MD  lactose free nutrition (BOOST PLUS) LIQD Take 237 mLs by mouth 2 (two) times daily between meals. 06/06/16   Lavina Hamman, MD  predniSONE (DELTASONE) 20 MG tablet Take 1 tablet (20 mg total) by mouth daily. Take 3 pills for 3 days, 2 pills for 3 days and then continue on hte 20 mg daily 08/29/16  Courteney Lyn Mackuen, MD    Family History Family History  Problem Relation Age of Onset  . Diabetes Mother     insulin dependent  . Breast cancer Mother     Social History Social History  Substance Use Topics  . Smoking status: Former Smoker    Packs/day: 0.50    Years: 40.00    Types: Cigarettes    Quit date: 10/29/2009  . Smokeless tobacco: Never Used  . Alcohol use No     Allergies   Shellfish allergy   Review of Systems Review of Systems A complete 10 system review of systems was obtained and all systems are negative except as noted in the HPI and PMH.    Physical Exam Updated Vital Signs BP 113/99 (BP Location: Left Arm)   Pulse (!) 131   Temp 100.4 F (38 C) (Oral)   Resp 22   Ht '5\' 5"'$  (1.651 m)   Wt 115 lb (52.2 kg)   SpO2 99%   BMI 19.14 kg/m   Physical Exam  Constitutional: She is oriented to person, place, and time. She appears cachectic. No distress.  Frail and cachectic.  HENT:  Head: Normocephalic and atraumatic.  Eyes: EOM are normal.  Neck:  Normal range of motion.  Cardiovascular: Regular rhythm and normal heart sounds.  Tachycardia present.   Pulmonary/Chest: She has wheezes.  Conversationally dyspneic. Expiratory wheezing bilaterally.  Abdominal: Soft. She exhibits no distension. There is no tenderness.  Musculoskeletal: Normal range of motion.  Neurological: She is alert and oriented to person, place, and time.  Skin: Skin is warm and dry.  Psychiatric: She has a normal mood and affect. Judgment normal.  Nursing note and vitals reviewed.    ED Treatments / Results   DIAGNOSTIC STUDIES: Oxygen Saturation is 99% on Peridot 4 L/min, normal by my interpretation.   COORDINATION OF CARE: 11:19 AM- Will order CXR and nebulizer treatment. Pt verbalizes understanding and agrees to plan.  Medications  albuterol (PROVENTIL) (2.5 MG/3ML) 0.083% nebulizer solution 5 mg (5 mg Nebulization Not Given 09/27/16 1122)  HYDROmorphone (DILAUDID) injection 0.75 mg (0.75 mg Intravenous Given 09/27/16 1145)  acetaminophen (TYLENOL) tablet 650 mg (650 mg Oral Given 09/27/16 1145)  ipratropium-albuterol (DUONEB) 0.5-2.5 (3) MG/3ML nebulizer solution 3 mL (3 mLs Nebulization Given 09/27/16 1203)  LORazepam (ATIVAN) injection 0.5 mg (0.5 mg Intravenous Given 09/27/16 1146)  azithromycin (ZITHROMAX) tablet 500 mg (500 mg Oral Given 09/27/16 1427)  heparin lock flush 100 unit/mL (500 Units Intracatheter Given 09/27/16 1429)   Labs (all labs ordered are listed, but only abnormal results are displayed) Labs Reviewed  URINALYSIS, ROUTINE W REFLEX MICROSCOPIC - Abnormal; Notable for the following:       Result Value   Color, Urine STRAW (*)    Leukocytes, UA MODERATE (*)    Squamous Epithelial / LPF 6-30 (*)    All other components within normal limits    EKG  EKG Interpretation  Date/Time:  Friday September 27 2016 10:36:02 EST Ventricular Rate:  127 PR Interval:    QRS Duration: 79 QT Interval:  305 QTC Calculation: 444 R Axis:   82 Text  Interpretation:  Sinus tachycardia Ventricular premature complex Borderline right axis deviation Nonspecific T abnormalities, lateral leads Confirmed by Wilson Singer  MD, Annie Main ([id number]) on 09/27/2016 11:09:40 AM Also confirmed by Wilson Singer  MD, Airis Barbee [phone]), editor [place] CT, [place] [phone])  on 09/27/2016 11:27:19 AM       Radiology Dg Chest 2 View  Result Date: 09/27/2016  CLINICAL DATA:  Shortness of breath.  Cough. EXAM: CHEST  2 VIEW COMPARISON:  CT 07/21/2016.  Chest x-ray 07/21/2016, 07/11/2016. FINDINGS: PowerPort catheter noted in stable position. Heart size normal. Mediastinal fullness again noted consistent with adenopathy. No interim change. Cavitary lesion is again noted in the posterior left upper lung. Multifocal infiltrates are again noted. Pleural thickening is again noted. IMPRESSION: 1.  PowerPort catheter stable position. 2. Stable mediastinal fullness consistent with previously identified adenopathy. 3. Stable cavitary lesion posterior aspect left upper lung. Persistent unchanged multifocal bilateral pulmonary infiltrates and pleural thickening. Electronically Signed   By: Marcello Moores  Register   On: 09/27/2016 11:55    Procedures Procedures (including critical care time)  Medications Ordered in ED Medications  albuterol (PROVENTIL) (2.5 MG/3ML) 0.083% nebulizer solution 5 mg (5 mg Nebulization Not Given 09/27/16 1122)  HYDROmorphone (DILAUDID) injection 0.75 mg (0.75 mg Intravenous Given 09/27/16 1145)  acetaminophen (TYLENOL) tablet 650 mg (650 mg Oral Given 09/27/16 1145)  ipratropium-albuterol (DUONEB) 0.5-2.5 (3) MG/3ML nebulizer solution 3 mL (3 mLs Nebulization Given 09/27/16 1203)  LORazepam (ATIVAN) injection 0.5 mg (0.5 mg Intravenous Given 09/27/16 1146)  azithromycin (ZITHROMAX) tablet 500 mg (500 mg Oral Given 09/27/16 1427)  heparin lock flush 100 unit/mL (500 Units Intracatheter Given 09/27/16 1429)     Initial Impression / Assessment and Plan / ED Course  I have reviewed the  triage vital signs and the nursing notes.  Pertinent labs & imaging results that were available during my care of the patient were reviewed by me and considered in my medical decision making (see chart for details).  Clinical Course     75yF with dyspnea. On hospice. Very realistic in her goals and comfortable with discharge. Feels better after neb. Will place on abx with underlying disease and fever or cover for possible bacterial respiratory infection.   Final Clinical Impressions(s) / ED Diagnoses   Final diagnoses:  Dyspnea, unspecified type   I personally preformed the services scribed in my presence. The recorded information has been reviewed is accurate. Virgel Manifold, MD.  New Prescriptions New Prescriptions   AZITHROMYCIN (ZITHROMAX) 250 MG TABLET    Take 1 tablet (250 mg total) by mouth daily. Take 1 tablet daily starting on 09/28/16.     Virgel Manifold, MD 10/01/16 [phone]

## 2016-10-21 DIAGNOSIS — K219 Gastro-esophageal reflux disease without esophagitis: Secondary | ICD-10-CM | POA: Diagnosis not present

## 2016-10-21 DIAGNOSIS — I10 Essential (primary) hypertension: Secondary | ICD-10-CM | POA: Diagnosis not present

## 2016-10-21 DIAGNOSIS — E785 Hyperlipidemia, unspecified: Secondary | ICD-10-CM | POA: Diagnosis not present

## 2016-10-21 DIAGNOSIS — C3490 Malignant neoplasm of unspecified part of unspecified bronchus or lung: Secondary | ICD-10-CM | POA: Diagnosis not present

## 2016-10-21 DIAGNOSIS — J449 Chronic obstructive pulmonary disease, unspecified: Secondary | ICD-10-CM | POA: Diagnosis not present

## 2016-10-21 DIAGNOSIS — C771 Secondary and unspecified malignant neoplasm of intrathoracic lymph nodes: Secondary | ICD-10-CM | POA: Diagnosis not present

## 2016-10-23 ENCOUNTER — Other Ambulatory Visit: Payer: Self-pay | Admitting: Internal Medicine

## 2016-10-23 DIAGNOSIS — E785 Hyperlipidemia, unspecified: Secondary | ICD-10-CM | POA: Diagnosis not present

## 2016-10-23 DIAGNOSIS — I10 Essential (primary) hypertension: Secondary | ICD-10-CM | POA: Diagnosis not present

## 2016-10-23 DIAGNOSIS — J449 Chronic obstructive pulmonary disease, unspecified: Secondary | ICD-10-CM | POA: Diagnosis not present

## 2016-10-23 DIAGNOSIS — C3490 Malignant neoplasm of unspecified part of unspecified bronchus or lung: Secondary | ICD-10-CM | POA: Diagnosis not present

## 2016-10-23 DIAGNOSIS — K219 Gastro-esophageal reflux disease without esophagitis: Secondary | ICD-10-CM | POA: Diagnosis not present

## 2016-10-23 DIAGNOSIS — C771 Secondary and unspecified malignant neoplasm of intrathoracic lymph nodes: Secondary | ICD-10-CM | POA: Diagnosis not present

## 2016-10-25 DIAGNOSIS — E785 Hyperlipidemia, unspecified: Secondary | ICD-10-CM | POA: Diagnosis not present

## 2016-10-25 DIAGNOSIS — J449 Chronic obstructive pulmonary disease, unspecified: Secondary | ICD-10-CM | POA: Diagnosis not present

## 2016-10-25 DIAGNOSIS — I10 Essential (primary) hypertension: Secondary | ICD-10-CM | POA: Diagnosis not present

## 2016-10-25 DIAGNOSIS — C771 Secondary and unspecified malignant neoplasm of intrathoracic lymph nodes: Secondary | ICD-10-CM | POA: Diagnosis not present

## 2016-10-25 DIAGNOSIS — K219 Gastro-esophageal reflux disease without esophagitis: Secondary | ICD-10-CM | POA: Diagnosis not present

## 2016-10-25 DIAGNOSIS — C3490 Malignant neoplasm of unspecified part of unspecified bronchus or lung: Secondary | ICD-10-CM | POA: Diagnosis not present

## 2016-10-28 DIAGNOSIS — E785 Hyperlipidemia, unspecified: Secondary | ICD-10-CM | POA: Diagnosis not present

## 2016-10-28 DIAGNOSIS — C771 Secondary and unspecified malignant neoplasm of intrathoracic lymph nodes: Secondary | ICD-10-CM | POA: Diagnosis not present

## 2016-10-28 DIAGNOSIS — J449 Chronic obstructive pulmonary disease, unspecified: Secondary | ICD-10-CM | POA: Diagnosis not present

## 2016-10-28 DIAGNOSIS — C3490 Malignant neoplasm of unspecified part of unspecified bronchus or lung: Secondary | ICD-10-CM | POA: Diagnosis not present

## 2016-10-28 DIAGNOSIS — I10 Essential (primary) hypertension: Secondary | ICD-10-CM | POA: Diagnosis not present

## 2016-10-28 DIAGNOSIS — K219 Gastro-esophageal reflux disease without esophagitis: Secondary | ICD-10-CM | POA: Diagnosis not present

## 2016-10-29 DIAGNOSIS — K219 Gastro-esophageal reflux disease without esophagitis: Secondary | ICD-10-CM | POA: Diagnosis not present

## 2016-10-29 DIAGNOSIS — I10 Essential (primary) hypertension: Secondary | ICD-10-CM | POA: Diagnosis not present

## 2016-10-29 DIAGNOSIS — C3490 Malignant neoplasm of unspecified part of unspecified bronchus or lung: Secondary | ICD-10-CM | POA: Diagnosis not present

## 2016-10-29 DIAGNOSIS — E785 Hyperlipidemia, unspecified: Secondary | ICD-10-CM | POA: Diagnosis not present

## 2016-10-29 DIAGNOSIS — J449 Chronic obstructive pulmonary disease, unspecified: Secondary | ICD-10-CM | POA: Diagnosis not present

## 2016-10-29 DIAGNOSIS — C771 Secondary and unspecified malignant neoplasm of intrathoracic lymph nodes: Secondary | ICD-10-CM | POA: Diagnosis not present

## 2016-10-31 DIAGNOSIS — C3490 Malignant neoplasm of unspecified part of unspecified bronchus or lung: Secondary | ICD-10-CM | POA: Diagnosis not present

## 2016-10-31 DIAGNOSIS — E785 Hyperlipidemia, unspecified: Secondary | ICD-10-CM | POA: Diagnosis not present

## 2016-10-31 DIAGNOSIS — K219 Gastro-esophageal reflux disease without esophagitis: Secondary | ICD-10-CM | POA: Diagnosis not present

## 2016-10-31 DIAGNOSIS — C771 Secondary and unspecified malignant neoplasm of intrathoracic lymph nodes: Secondary | ICD-10-CM | POA: Diagnosis not present

## 2016-10-31 DIAGNOSIS — J449 Chronic obstructive pulmonary disease, unspecified: Secondary | ICD-10-CM | POA: Diagnosis not present

## 2016-10-31 DIAGNOSIS — I10 Essential (primary) hypertension: Secondary | ICD-10-CM | POA: Diagnosis not present

## 2016-11-05 DIAGNOSIS — I10 Essential (primary) hypertension: Secondary | ICD-10-CM | POA: Diagnosis not present

## 2016-11-05 DIAGNOSIS — K219 Gastro-esophageal reflux disease without esophagitis: Secondary | ICD-10-CM | POA: Diagnosis not present

## 2016-11-05 DIAGNOSIS — C3490 Malignant neoplasm of unspecified part of unspecified bronchus or lung: Secondary | ICD-10-CM | POA: Diagnosis not present

## 2016-11-05 DIAGNOSIS — C771 Secondary and unspecified malignant neoplasm of intrathoracic lymph nodes: Secondary | ICD-10-CM | POA: Diagnosis not present

## 2016-11-05 DIAGNOSIS — E785 Hyperlipidemia, unspecified: Secondary | ICD-10-CM | POA: Diagnosis not present

## 2016-11-05 DIAGNOSIS — J449 Chronic obstructive pulmonary disease, unspecified: Secondary | ICD-10-CM | POA: Diagnosis not present

## 2016-11-06 DIAGNOSIS — I10 Essential (primary) hypertension: Secondary | ICD-10-CM | POA: Diagnosis not present

## 2016-11-06 DIAGNOSIS — C771 Secondary and unspecified malignant neoplasm of intrathoracic lymph nodes: Secondary | ICD-10-CM | POA: Diagnosis not present

## 2016-11-06 DIAGNOSIS — J449 Chronic obstructive pulmonary disease, unspecified: Secondary | ICD-10-CM | POA: Diagnosis not present

## 2016-11-06 DIAGNOSIS — E785 Hyperlipidemia, unspecified: Secondary | ICD-10-CM | POA: Diagnosis not present

## 2016-11-06 DIAGNOSIS — K219 Gastro-esophageal reflux disease without esophagitis: Secondary | ICD-10-CM | POA: Diagnosis not present

## 2016-11-06 DIAGNOSIS — C3490 Malignant neoplasm of unspecified part of unspecified bronchus or lung: Secondary | ICD-10-CM | POA: Diagnosis not present

## 2016-11-08 DIAGNOSIS — E785 Hyperlipidemia, unspecified: Secondary | ICD-10-CM | POA: Diagnosis not present

## 2016-11-08 DIAGNOSIS — C3490 Malignant neoplasm of unspecified part of unspecified bronchus or lung: Secondary | ICD-10-CM | POA: Diagnosis not present

## 2016-11-08 DIAGNOSIS — J449 Chronic obstructive pulmonary disease, unspecified: Secondary | ICD-10-CM | POA: Diagnosis not present

## 2016-11-08 DIAGNOSIS — C771 Secondary and unspecified malignant neoplasm of intrathoracic lymph nodes: Secondary | ICD-10-CM | POA: Diagnosis not present

## 2016-11-08 DIAGNOSIS — I10 Essential (primary) hypertension: Secondary | ICD-10-CM | POA: Diagnosis not present

## 2016-11-08 DIAGNOSIS — K219 Gastro-esophageal reflux disease without esophagitis: Secondary | ICD-10-CM | POA: Diagnosis not present

## 2016-11-12 DIAGNOSIS — C771 Secondary and unspecified malignant neoplasm of intrathoracic lymph nodes: Secondary | ICD-10-CM | POA: Diagnosis not present

## 2016-11-12 DIAGNOSIS — J449 Chronic obstructive pulmonary disease, unspecified: Secondary | ICD-10-CM | POA: Diagnosis not present

## 2016-11-12 DIAGNOSIS — E785 Hyperlipidemia, unspecified: Secondary | ICD-10-CM | POA: Diagnosis not present

## 2016-11-12 DIAGNOSIS — C3490 Malignant neoplasm of unspecified part of unspecified bronchus or lung: Secondary | ICD-10-CM | POA: Diagnosis not present

## 2016-11-12 DIAGNOSIS — I10 Essential (primary) hypertension: Secondary | ICD-10-CM | POA: Diagnosis not present

## 2016-11-12 DIAGNOSIS — K219 Gastro-esophageal reflux disease without esophagitis: Secondary | ICD-10-CM | POA: Diagnosis not present

## 2016-11-13 DIAGNOSIS — I10 Essential (primary) hypertension: Secondary | ICD-10-CM | POA: Diagnosis not present

## 2016-11-13 DIAGNOSIS — C771 Secondary and unspecified malignant neoplasm of intrathoracic lymph nodes: Secondary | ICD-10-CM | POA: Diagnosis not present

## 2016-11-13 DIAGNOSIS — J449 Chronic obstructive pulmonary disease, unspecified: Secondary | ICD-10-CM | POA: Diagnosis not present

## 2016-11-13 DIAGNOSIS — K219 Gastro-esophageal reflux disease without esophagitis: Secondary | ICD-10-CM | POA: Diagnosis not present

## 2016-11-13 DIAGNOSIS — E785 Hyperlipidemia, unspecified: Secondary | ICD-10-CM | POA: Diagnosis not present

## 2016-11-13 DIAGNOSIS — C3490 Malignant neoplasm of unspecified part of unspecified bronchus or lung: Secondary | ICD-10-CM | POA: Diagnosis not present

## 2016-11-14 DIAGNOSIS — E785 Hyperlipidemia, unspecified: Secondary | ICD-10-CM | POA: Diagnosis not present

## 2016-11-14 DIAGNOSIS — I10 Essential (primary) hypertension: Secondary | ICD-10-CM | POA: Diagnosis not present

## 2016-11-14 DIAGNOSIS — C771 Secondary and unspecified malignant neoplasm of intrathoracic lymph nodes: Secondary | ICD-10-CM | POA: Diagnosis not present

## 2016-11-14 DIAGNOSIS — J449 Chronic obstructive pulmonary disease, unspecified: Secondary | ICD-10-CM | POA: Diagnosis not present

## 2016-11-14 DIAGNOSIS — K219 Gastro-esophageal reflux disease without esophagitis: Secondary | ICD-10-CM | POA: Diagnosis not present

## 2016-11-14 DIAGNOSIS — C3490 Malignant neoplasm of unspecified part of unspecified bronchus or lung: Secondary | ICD-10-CM | POA: Diagnosis not present

## 2016-11-15 DIAGNOSIS — C771 Secondary and unspecified malignant neoplasm of intrathoracic lymph nodes: Secondary | ICD-10-CM | POA: Diagnosis not present

## 2016-11-15 DIAGNOSIS — K219 Gastro-esophageal reflux disease without esophagitis: Secondary | ICD-10-CM | POA: Diagnosis not present

## 2016-11-15 DIAGNOSIS — C3490 Malignant neoplasm of unspecified part of unspecified bronchus or lung: Secondary | ICD-10-CM | POA: Diagnosis not present

## 2016-11-15 DIAGNOSIS — I10 Essential (primary) hypertension: Secondary | ICD-10-CM | POA: Diagnosis not present

## 2016-11-15 DIAGNOSIS — J449 Chronic obstructive pulmonary disease, unspecified: Secondary | ICD-10-CM | POA: Diagnosis not present

## 2016-11-15 DIAGNOSIS — E785 Hyperlipidemia, unspecified: Secondary | ICD-10-CM | POA: Diagnosis not present

## 2016-11-16 DIAGNOSIS — K219 Gastro-esophageal reflux disease without esophagitis: Secondary | ICD-10-CM | POA: Diagnosis not present

## 2016-11-16 DIAGNOSIS — J449 Chronic obstructive pulmonary disease, unspecified: Secondary | ICD-10-CM | POA: Diagnosis not present

## 2016-11-16 DIAGNOSIS — I10 Essential (primary) hypertension: Secondary | ICD-10-CM | POA: Diagnosis not present

## 2016-11-16 DIAGNOSIS — C3490 Malignant neoplasm of unspecified part of unspecified bronchus or lung: Secondary | ICD-10-CM | POA: Diagnosis not present

## 2016-11-16 DIAGNOSIS — E785 Hyperlipidemia, unspecified: Secondary | ICD-10-CM | POA: Diagnosis not present

## 2016-11-16 DIAGNOSIS — C771 Secondary and unspecified malignant neoplasm of intrathoracic lymph nodes: Secondary | ICD-10-CM | POA: Diagnosis not present

## 2016-11-19 ENCOUNTER — Telehealth: Payer: Self-pay | Admitting: Internal Medicine

## 2016-11-19 NOTE — Telephone Encounter (Signed)
Horris Latino from hospice called to inform us that Ms Delaine passed away on 2016/11/29 '@4'$ :35pm at home

## 2016-11-21 DEATH — deceased

## 2017-01-31 ENCOUNTER — Other Ambulatory Visit: Payer: Self-pay | Admitting: Nurse Practitioner

## 2018-01-24 IMAGING — RF DG ESOPHAGUS
10 of 13 series · 14 of 19 positions shown · non-contrast
Comparison: Neck CT same date.

CLINICAL DATA: 74-year-old female with lung cancer on chemotherapy.
Difficulty swallowing pills. Request for emergent swallow prior to
endoscopy.

EXAM:
ESOPHOGRAM/BARIUM SWALLOW
TECHNIQUE: Single contrast examination was performed using  thin barium.
FLUOROSCOPY TIME:  Radiation Exposure Index (as provided by the
fluoroscopic device): 28.5 micro Gray
Fluoroscopy Time:  1 minutes and 40 seconds.

[Series 1: fluoro_barium 2fps_bw · 0.18mm/px · 3 of 26 frames shown (1 of 2)]
[frame 4/26]
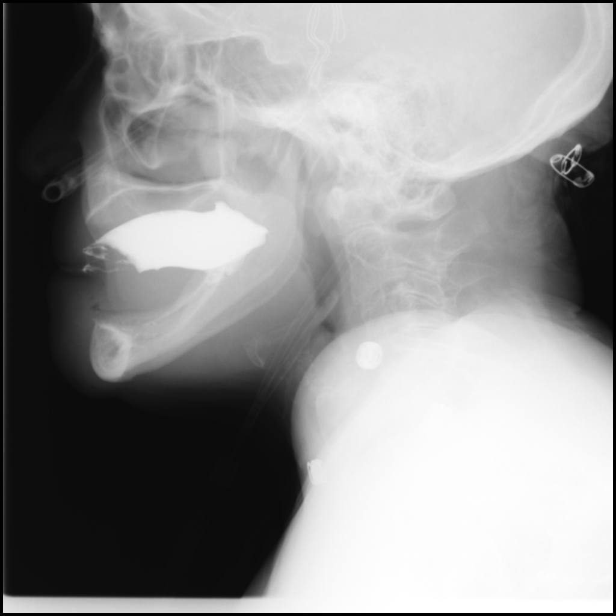
[frame 17/26]
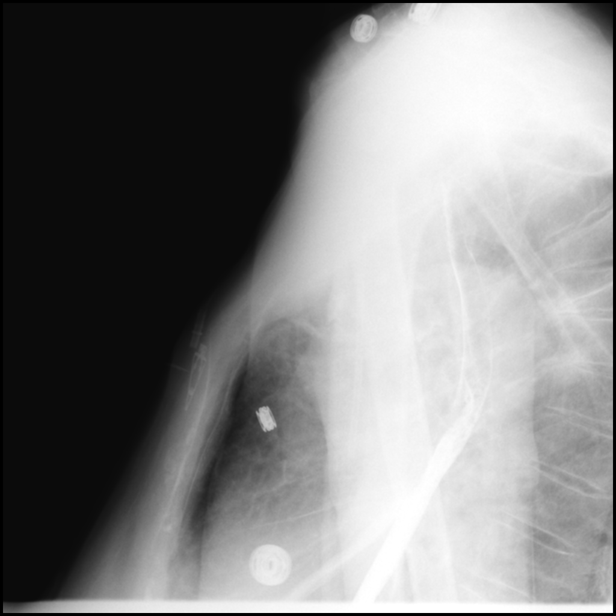
[frame 23/26]
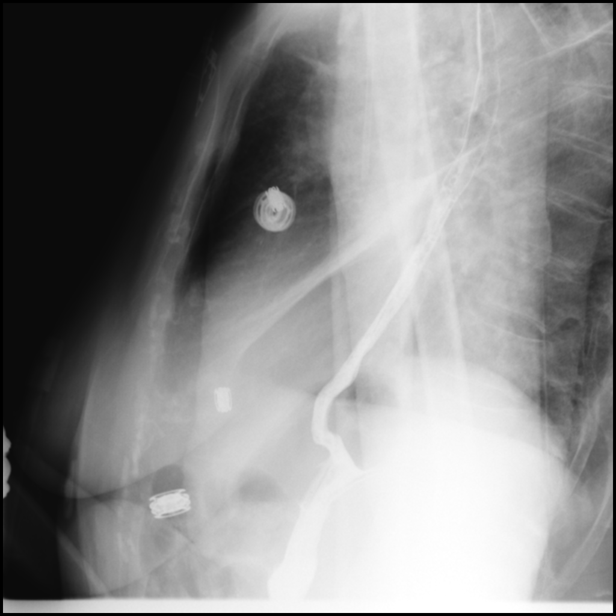

[Series 2: fluoro_barium 2fps_bw · 0.18mm/px · 3 of 31 frames shown (2 of 2)]
[frame 5/31]
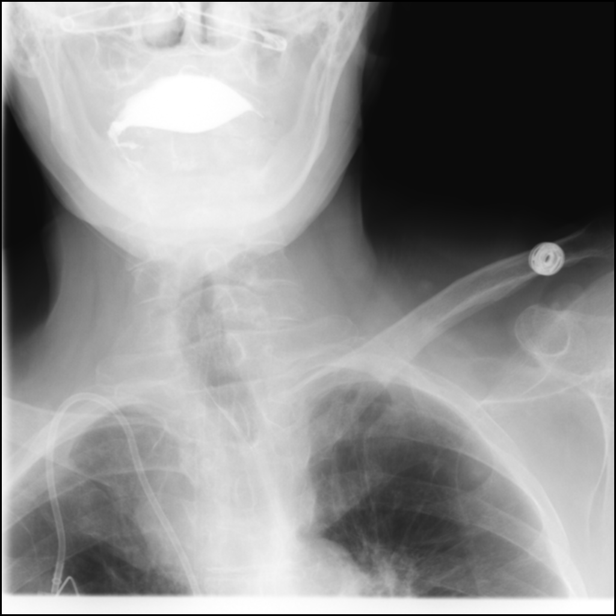
[frame 23/31]
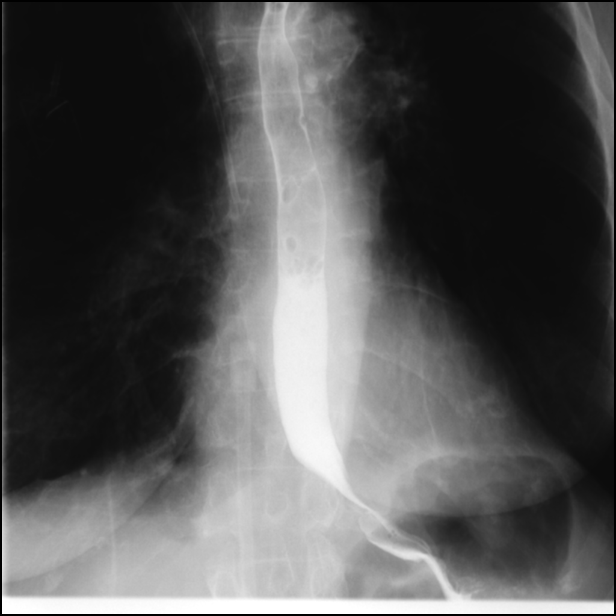
[frame 27/31]
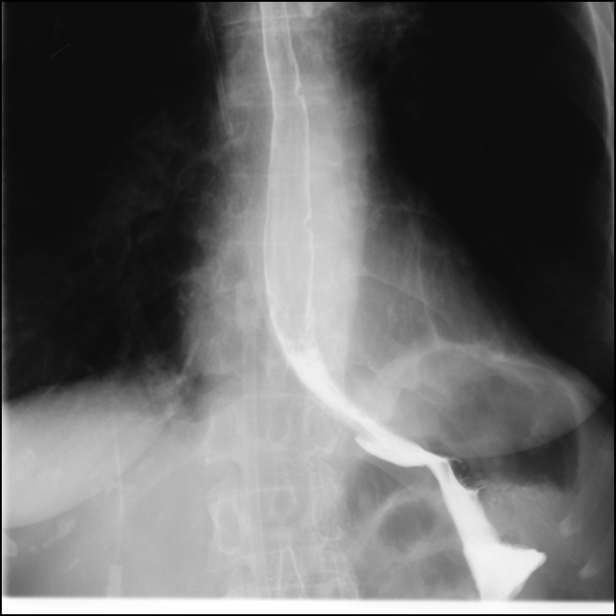

[Series 3: cp_standard · 0.29mm/px · 1 of 1 slices shown (1 of 8)]
[im 1/1]
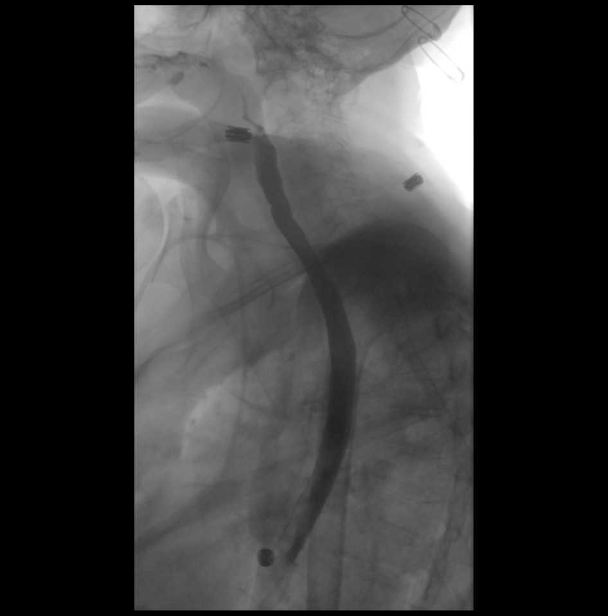

[Series 5: cp_standard · 0.29mm/px · 1 of 1 slices shown (2 of 8)]
[im 1/1]
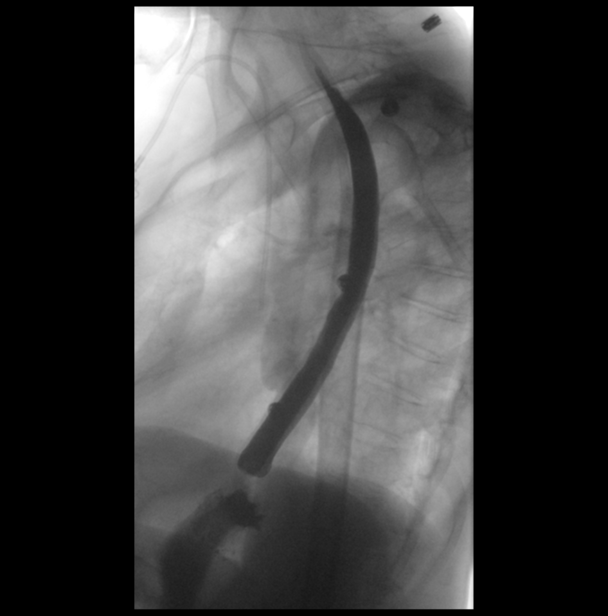

[Series 6: cp_standard · 0.29mm/px · 1 of 1 slices shown (3 of 8)]
[im 1/1]
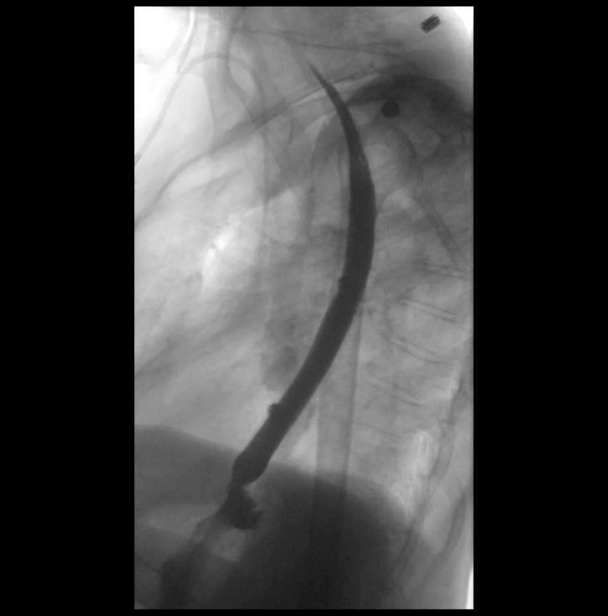

[Series 7: cp_standard · 0.29mm/px · 1 of 1 slices shown (4 of 8)]
[im 1/1]
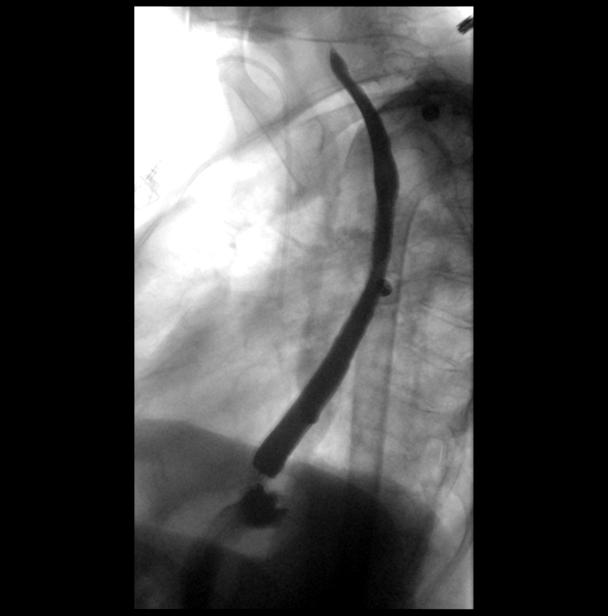

[Series 9: cp_standard · 0.28mm/px · 1 of 1 slices shown (5 of 8)]
[im 1/1]
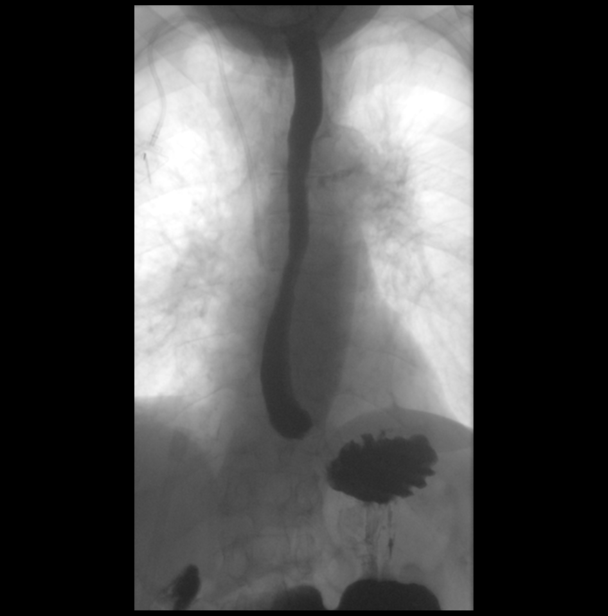

[Series 10: cp_standard · 0.19mm/px · 1 of 1 slices shown (6 of 8)]
[im 1/1]
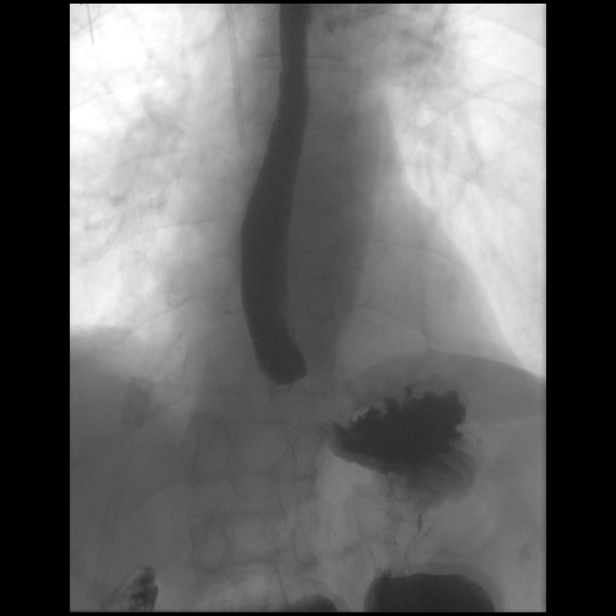

[Series 11: cp_standard · 0.20mm/px · 1 of 1 slices shown (7 of 8)]
[im 1/1]
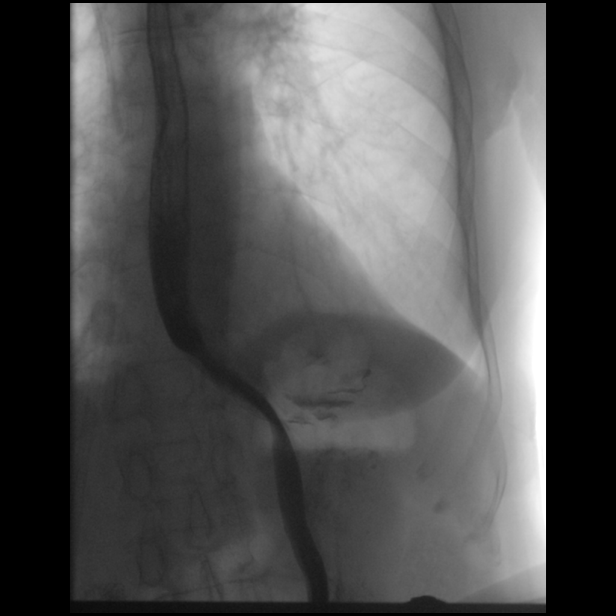

[Series 13: cp_standard · 0.20mm/px · 1 of 1 slices shown (8 of 8)]
[im 1/1]
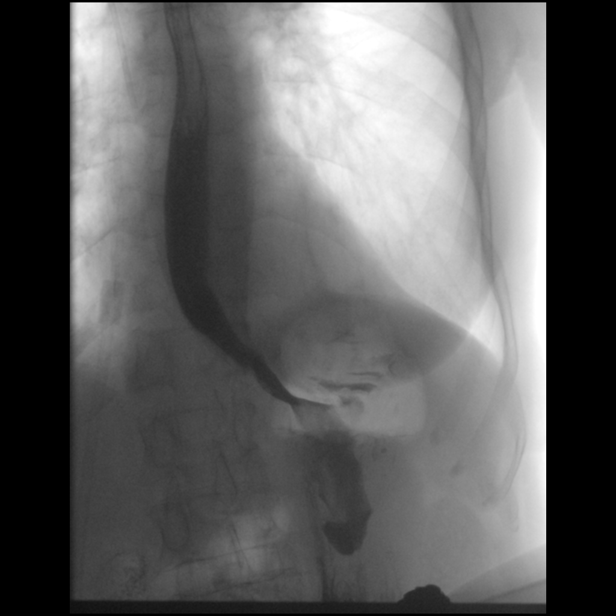

[14 of 19 positions shown; findings below may reference images not displayed]

FINDINGS: No laryngeal penetration or aspiration.

No esophageal obstructing lesion.

Poor primary esophageal stripping wave.

Patient ingested a 13 mm barium tablet without difficulty.
IMPRESSION: No esophageal obstructing lesion.

Poor primary esophageal stripping wave (most notable with patient in
supine position).

Patient ingested a 13 mm barium tablet without difficulty.

## 2018-02-14 IMAGING — CR DG CHEST 2V
2 series · 2 of 2 positions shown · non-contrast
Comparison: 05/07/2016

CLINICAL DATA: SOB increasing x 2 days - currently being treated
for lung cancer - hx COPD - hx asthma

EXAM:
CHEST  2 VIEW

[w chest lat]
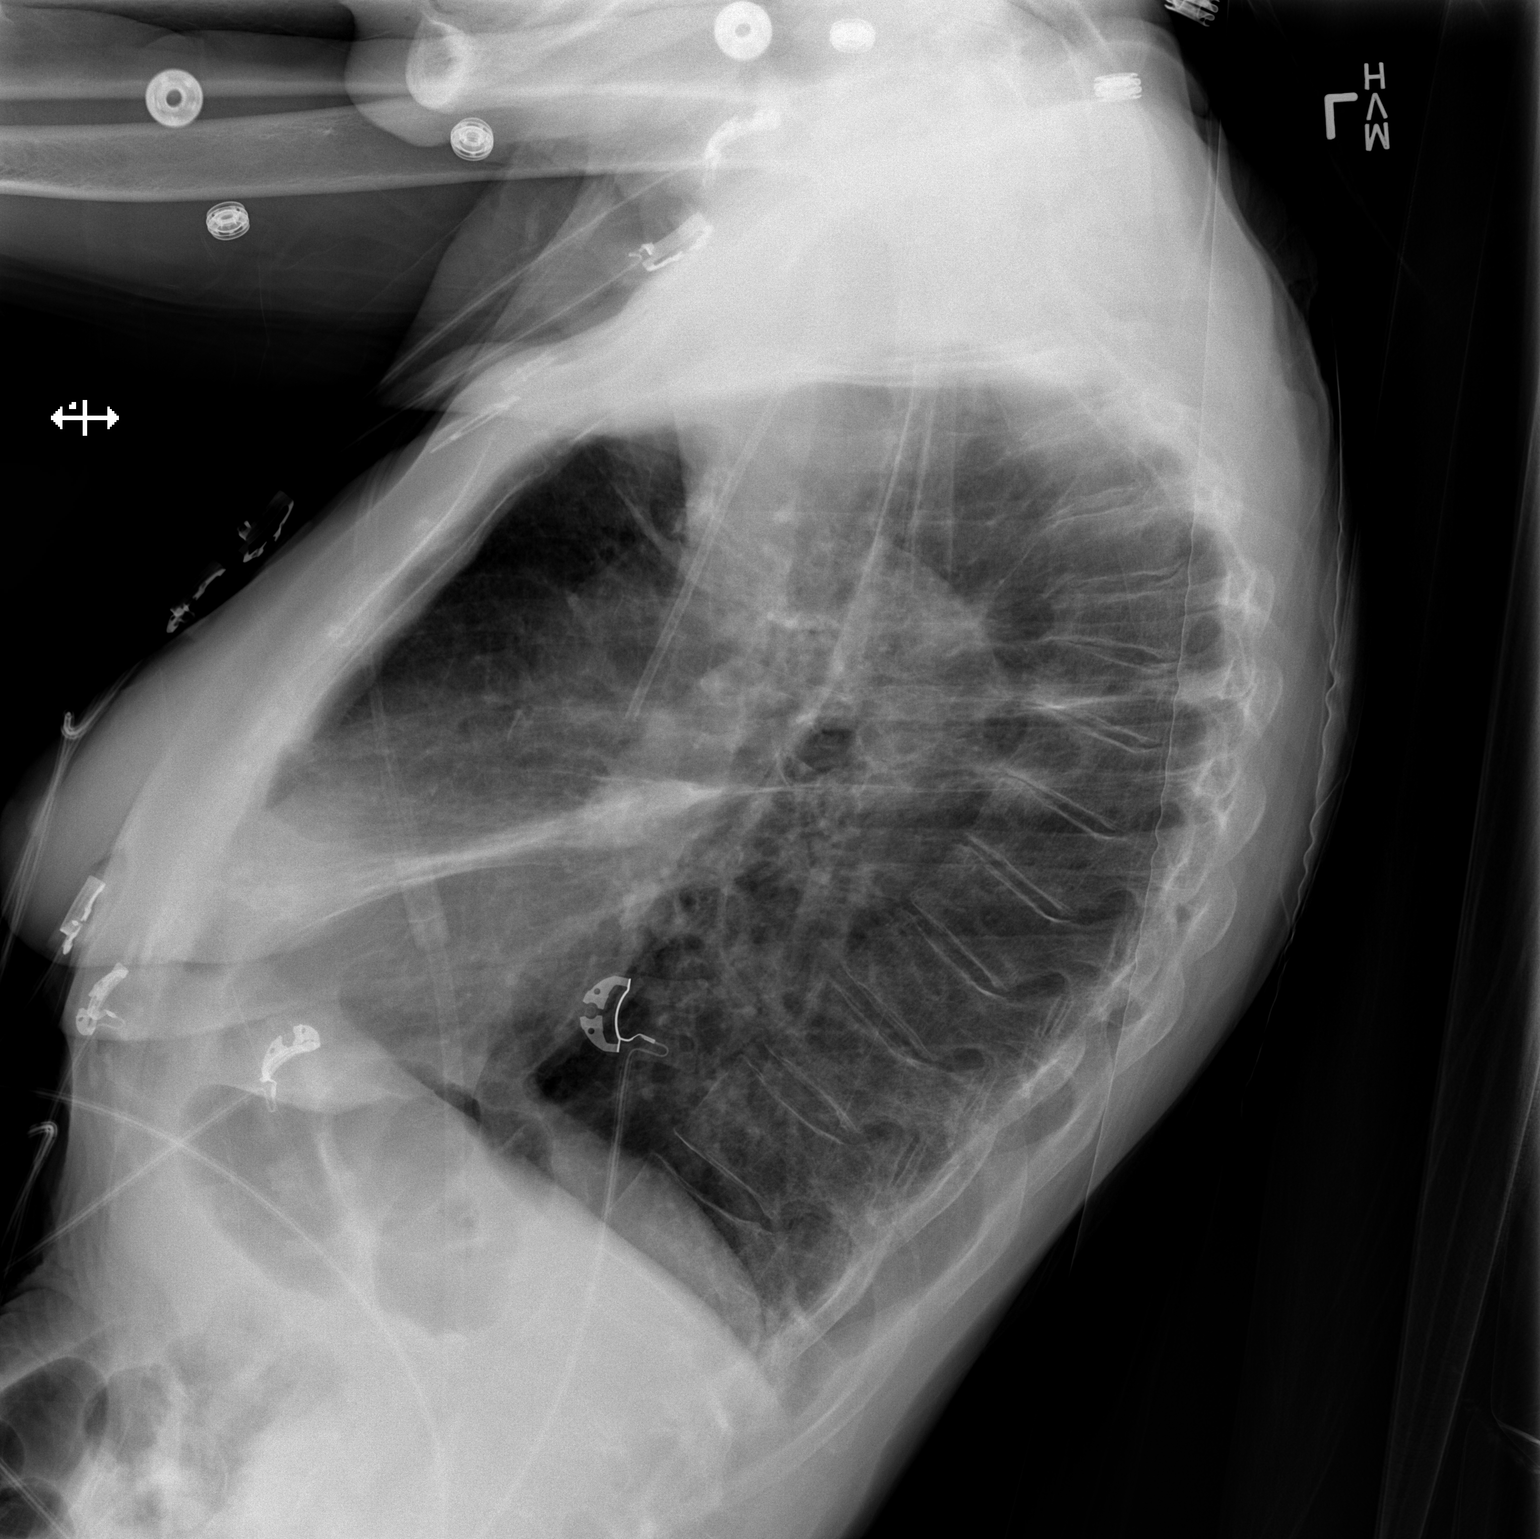

[x chest ap]
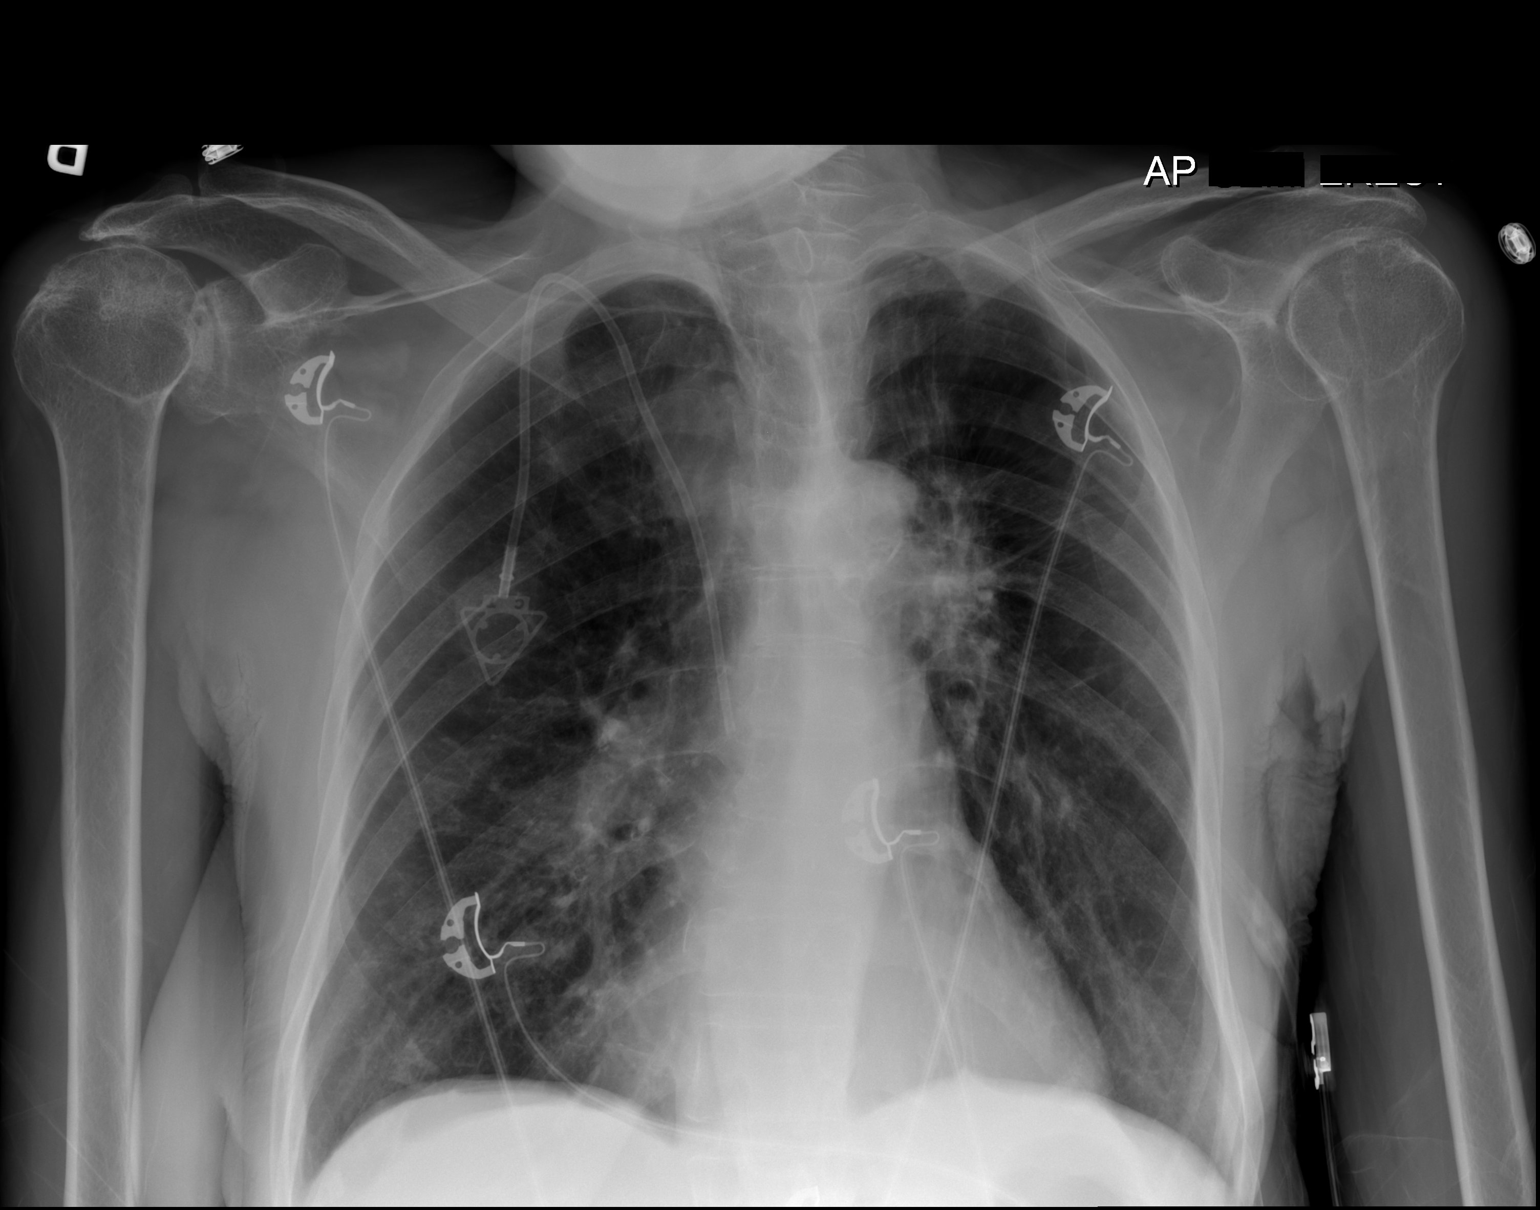

[2 of 2 positions shown; findings below may reference images not displayed]

FINDINGS: The cardiac silhouette is normal in size. No mediastinal masses or
convincing adenopathy.

Coarse reticular type opacity extends from superior left hilum.
There is fullness from the inferior right hilum. These findings
stable.

Lungs are hyperexpanded but otherwise clear. No pleural effusion or
pneumothorax.

Right anterior chest wall Port-A-Cath is stable.

Bony thorax is demineralized. Mild compression fracture of a mid to
upper thoracic vertebra, also stable. No convincing osteoblastic or
osteolytic lesions.
IMPRESSION: No acute cardiopulmonary disease. No significant change from the
previous chest radiograph.

## 2019-03-01 ENCOUNTER — Encounter
